# Patient Record
Sex: Male | Born: 1960 | Race: White | Hispanic: No | Marital: Single | Smoking: Former smoker
Health system: Southern US, Community
[De-identification: ages and names within clinical notes are randomized; demographics above are authoritative.]

## PROBLEM LIST (undated history)

## (undated) DIAGNOSIS — E119 Type 2 diabetes mellitus without complications: Secondary | ICD-10-CM

## (undated) DIAGNOSIS — M549 Dorsalgia, unspecified: Secondary | ICD-10-CM

## (undated) DIAGNOSIS — K219 Gastro-esophageal reflux disease without esophagitis: Secondary | ICD-10-CM

## (undated) DIAGNOSIS — K5732 Diverticulitis of large intestine without perforation or abscess without bleeding: Secondary | ICD-10-CM

## (undated) DIAGNOSIS — Z87442 Personal history of urinary calculi: Secondary | ICD-10-CM

## (undated) DIAGNOSIS — L0390 Cellulitis, unspecified: Secondary | ICD-10-CM

## (undated) DIAGNOSIS — G8929 Other chronic pain: Secondary | ICD-10-CM

## (undated) DIAGNOSIS — R911 Solitary pulmonary nodule: Secondary | ICD-10-CM

## (undated) DIAGNOSIS — F419 Anxiety disorder, unspecified: Secondary | ICD-10-CM

## (undated) DIAGNOSIS — J449 Chronic obstructive pulmonary disease, unspecified: Secondary | ICD-10-CM

## (undated) DIAGNOSIS — I10 Essential (primary) hypertension: Secondary | ICD-10-CM

## (undated) DIAGNOSIS — F40240 Claustrophobia: Secondary | ICD-10-CM

## (undated) DIAGNOSIS — I219 Acute myocardial infarction, unspecified: Secondary | ICD-10-CM

## (undated) DIAGNOSIS — M1990 Unspecified osteoarthritis, unspecified site: Secondary | ICD-10-CM

## (undated) DIAGNOSIS — E669 Obesity, unspecified: Secondary | ICD-10-CM

## (undated) DIAGNOSIS — E785 Hyperlipidemia, unspecified: Secondary | ICD-10-CM

## (undated) HISTORY — DX: Acute myocardial infarction, unspecified: I21.9

## (undated) HISTORY — DX: Gastro-esophageal reflux disease without esophagitis: K21.9

## (undated) HISTORY — DX: Anxiety disorder, unspecified: F41.9

## (undated) HISTORY — DX: Type 2 diabetes mellitus without complications: E11.9

## (undated) HISTORY — DX: Cellulitis, unspecified: L03.90

## (undated) HISTORY — DX: Dorsalgia, unspecified: M54.9

## (undated) HISTORY — DX: Claustrophobia: F40.240

## (undated) HISTORY — DX: Hyperlipidemia, unspecified: E78.5

## (undated) HISTORY — DX: Essential (primary) hypertension: I10

## (undated) HISTORY — DX: Other chronic pain: G89.29

## (undated) HISTORY — DX: Solitary pulmonary nodule: R91.1

## (undated) HISTORY — DX: Diverticulitis of large intestine without perforation or abscess without bleeding: K57.32

## (undated) HISTORY — PX: TONSILLECTOMY: SUR1361

## (undated) HISTORY — DX: Obesity, unspecified: E66.9

## (undated) HISTORY — DX: Chronic obstructive pulmonary disease, unspecified: J44.9

---

## 2003-08-03 ENCOUNTER — Ambulatory Visit (HOSPITAL_COMMUNITY): Admission: RE | Admit: 2003-08-03 | Discharge: 2003-08-03 | Payer: Self-pay | Admitting: Family Medicine

## 2003-08-22 ENCOUNTER — Ambulatory Visit (HOSPITAL_COMMUNITY): Admission: RE | Admit: 2003-08-22 | Discharge: 2003-08-22 | Payer: Self-pay | Admitting: Family Medicine

## 2004-03-03 ENCOUNTER — Ambulatory Visit: Payer: Self-pay | Admitting: Family Medicine

## 2004-04-09 ENCOUNTER — Ambulatory Visit: Payer: Self-pay | Admitting: Family Medicine

## 2004-05-08 ENCOUNTER — Ambulatory Visit: Payer: Self-pay | Admitting: Family Medicine

## 2004-07-07 ENCOUNTER — Ambulatory Visit: Payer: Self-pay | Admitting: Family Medicine

## 2004-08-11 ENCOUNTER — Ambulatory Visit: Payer: Self-pay | Admitting: Family Medicine

## 2004-08-18 ENCOUNTER — Ambulatory Visit (HOSPITAL_COMMUNITY): Admission: RE | Admit: 2004-08-18 | Discharge: 2004-08-18 | Payer: Self-pay | Admitting: Family Medicine

## 2004-09-09 ENCOUNTER — Ambulatory Visit: Payer: Self-pay | Admitting: Family Medicine

## 2004-10-13 ENCOUNTER — Ambulatory Visit: Payer: Self-pay | Admitting: Family Medicine

## 2004-11-25 ENCOUNTER — Ambulatory Visit: Payer: Self-pay | Admitting: Family Medicine

## 2005-01-12 HISTORY — PX: APPENDECTOMY: SHX54

## 2005-02-25 ENCOUNTER — Ambulatory Visit: Payer: Self-pay | Admitting: Family Medicine

## 2005-04-02 ENCOUNTER — Ambulatory Visit: Payer: Self-pay | Admitting: Family Medicine

## 2005-08-03 ENCOUNTER — Ambulatory Visit: Payer: Self-pay | Admitting: Family Medicine

## 2006-01-12 DIAGNOSIS — K5732 Diverticulitis of large intestine without perforation or abscess without bleeding: Secondary | ICD-10-CM

## 2006-01-12 HISTORY — DX: Diverticulitis of large intestine without perforation or abscess without bleeding: K57.32

## 2006-01-20 ENCOUNTER — Ambulatory Visit: Payer: Self-pay | Admitting: Family Medicine

## 2006-02-02 ENCOUNTER — Encounter: Payer: Self-pay | Admitting: Family Medicine

## 2006-02-02 ENCOUNTER — Encounter (INDEPENDENT_AMBULATORY_CARE_PROVIDER_SITE_OTHER): Payer: Self-pay | Admitting: *Deleted

## 2006-02-02 LAB — CONVERTED CEMR LAB
ALT: 23 units/L (ref 0–53)
Albumin: 3.9 g/dL (ref 3.5–5.2)
BUN: 10 mg/dL (ref 6–23)
Basophils Absolute: 0.1 10*3/uL (ref 0.0–0.1)
Bilirubin, Direct: <0.1 mg/dL (ref 0.0–0.3)
CO2: 27 meq/L (ref 19–32)
Calcium: 9 mg/dL (ref 8.4–10.5)
Cholesterol: 185 mg/dL (ref 0–200)
Creatinine, Ser: 0.89 mg/dL (ref 0.40–1.50)
Eosinophils Relative: 3 % (ref 0–5)
HCT: 46.9 % (ref 39.0–52.0)
Hgb A1c MFr Bld: 6.6 % — ABNORMAL HIGH (ref 4.6–6.1)
LDL Cholesterol: 80 mg/dL (ref 0–99)
Lymphocytes Relative: 28 % (ref 12–46)
MCV: 93.6 fL (ref 78.0–100.0)
Monocytes Absolute: 0.9 10*3/uL — ABNORMAL HIGH (ref 0.2–0.7)
Neutro Abs: 6.1 10*3/uL (ref 1.7–7.7)
Neutrophils Relative %: 59 % (ref 43–77)
PSA: 0.41 ng/mL
Platelets: 282 10*3/uL (ref 150–400)
RDW: 13.9 % (ref 11.5–14.0)
TSH: 4.573 microintl units/mL (ref 0.350–5.50)
Total Bilirubin: 0.2 mg/dL — ABNORMAL LOW (ref 0.3–1.2)
Triglycerides: 361 mg/dL — ABNORMAL HIGH (ref <150)
WBC: 10.3 10*3/uL (ref 4.0–10.5)

## 2006-05-26 ENCOUNTER — Emergency Department (HOSPITAL_COMMUNITY): Admission: EM | Admit: 2006-05-26 | Discharge: 2006-05-26 | Payer: Self-pay | Admitting: *Deleted

## 2006-05-26 ENCOUNTER — Ambulatory Visit: Payer: Self-pay | Admitting: Family Medicine

## 2006-06-04 ENCOUNTER — Ambulatory Visit: Payer: Self-pay | Admitting: Family Medicine

## 2006-07-01 ENCOUNTER — Emergency Department (HOSPITAL_COMMUNITY): Admission: EM | Admit: 2006-07-01 | Discharge: 2006-07-01 | Payer: Self-pay | Admitting: Emergency Medicine

## 2006-07-19 ENCOUNTER — Ambulatory Visit: Payer: Self-pay | Admitting: Family Medicine

## 2006-08-02 ENCOUNTER — Ambulatory Visit: Payer: Self-pay | Admitting: Gastroenterology

## 2006-08-05 ENCOUNTER — Ambulatory Visit (HOSPITAL_COMMUNITY): Admission: RE | Admit: 2006-08-05 | Discharge: 2006-08-05 | Payer: Self-pay | Admitting: Gastroenterology

## 2006-08-19 ENCOUNTER — Emergency Department (HOSPITAL_COMMUNITY): Admission: EM | Admit: 2006-08-19 | Discharge: 2006-08-19 | Payer: Self-pay | Admitting: Emergency Medicine

## 2006-08-30 ENCOUNTER — Ambulatory Visit: Payer: Self-pay | Admitting: Family Medicine

## 2006-11-23 ENCOUNTER — Emergency Department (HOSPITAL_COMMUNITY): Admission: EM | Admit: 2006-11-23 | Discharge: 2006-11-23 | Payer: Self-pay | Admitting: Emergency Medicine

## 2006-11-24 ENCOUNTER — Ambulatory Visit: Payer: Self-pay | Admitting: Urgent Care

## 2006-11-25 ENCOUNTER — Ambulatory Visit: Payer: Self-pay | Admitting: Family Medicine

## 2006-12-02 ENCOUNTER — Ambulatory Visit (HOSPITAL_COMMUNITY): Admission: RE | Admit: 2006-12-02 | Discharge: 2006-12-02 | Payer: Self-pay | Admitting: Gastroenterology

## 2006-12-20 ENCOUNTER — Ambulatory Visit: Payer: Self-pay | Admitting: Family Medicine

## 2007-01-13 ENCOUNTER — Encounter: Payer: Self-pay | Admitting: Family Medicine

## 2007-01-17 ENCOUNTER — Ambulatory Visit: Payer: Self-pay | Admitting: Family Medicine

## 2007-02-14 ENCOUNTER — Ambulatory Visit: Payer: Self-pay | Admitting: Family Medicine

## 2007-05-13 ENCOUNTER — Encounter (INDEPENDENT_AMBULATORY_CARE_PROVIDER_SITE_OTHER): Payer: Self-pay | Admitting: *Deleted

## 2007-05-13 DIAGNOSIS — E1165 Type 2 diabetes mellitus with hyperglycemia: Secondary | ICD-10-CM | POA: Insufficient documentation

## 2007-05-13 DIAGNOSIS — J984 Other disorders of lung: Secondary | ICD-10-CM | POA: Insufficient documentation

## 2007-05-13 DIAGNOSIS — E785 Hyperlipidemia, unspecified: Secondary | ICD-10-CM | POA: Insufficient documentation

## 2007-05-13 DIAGNOSIS — Z794 Long term (current) use of insulin: Secondary | ICD-10-CM

## 2007-05-13 DIAGNOSIS — F411 Generalized anxiety disorder: Secondary | ICD-10-CM | POA: Insufficient documentation

## 2007-05-13 DIAGNOSIS — E669 Obesity, unspecified: Secondary | ICD-10-CM | POA: Insufficient documentation

## 2007-05-13 DIAGNOSIS — I10 Essential (primary) hypertension: Secondary | ICD-10-CM | POA: Insufficient documentation

## 2007-05-13 DIAGNOSIS — J439 Emphysema, unspecified: Secondary | ICD-10-CM | POA: Insufficient documentation

## 2007-05-17 ENCOUNTER — Encounter: Payer: Self-pay | Admitting: Family Medicine

## 2007-05-17 ENCOUNTER — Ambulatory Visit: Payer: Self-pay | Admitting: Family Medicine

## 2007-05-17 LAB — CONVERTED CEMR LAB
Albumin: 4.4 g/dL (ref 3.5–5.2)
BUN: 16 mg/dL (ref 6–23)
CO2: 22 meq/L (ref 19–32)
Chloride: 105 meq/L (ref 96–112)
HDL: 33 mg/dL — ABNORMAL LOW (ref >39)
Hgb A1c MFr Bld: 6.4 %
LDL Cholesterol: 162 mg/dL — ABNORMAL HIGH (ref 0–99)
Potassium: 4.6 meq/L (ref 3.5–5.3)
Sodium: 142 meq/L (ref 135–145)
Total Bilirubin: 0.4 mg/dL (ref 0.3–1.2)
Total Protein: 7.2 g/dL (ref 6.0–8.3)
VLDL: 51 mg/dL — ABNORMAL HIGH (ref 0–40)

## 2007-06-16 ENCOUNTER — Encounter: Payer: Self-pay | Admitting: Family Medicine

## 2007-06-16 ENCOUNTER — Ambulatory Visit: Payer: Self-pay | Admitting: Family Medicine

## 2007-06-16 LAB — CONVERTED CEMR LAB: Glucose, Bld: 165 mg/dL

## 2007-08-22 ENCOUNTER — Encounter: Payer: Self-pay | Admitting: Family Medicine

## 2007-08-23 ENCOUNTER — Ambulatory Visit: Payer: Self-pay | Admitting: Family Medicine

## 2007-08-23 LAB — CONVERTED CEMR LAB: Glucose, Bld: 139 mg/dL

## 2007-09-02 ENCOUNTER — Encounter: Payer: Self-pay | Admitting: Family Medicine

## 2007-09-05 ENCOUNTER — Encounter: Payer: Self-pay | Admitting: Family Medicine

## 2007-09-12 ENCOUNTER — Encounter: Payer: Self-pay | Admitting: Family Medicine

## 2007-09-13 ENCOUNTER — Encounter: Payer: Self-pay | Admitting: Family Medicine

## 2007-10-06 ENCOUNTER — Encounter (INDEPENDENT_AMBULATORY_CARE_PROVIDER_SITE_OTHER): Payer: Self-pay | Admitting: Family Medicine

## 2007-10-06 ENCOUNTER — Encounter: Payer: Self-pay | Admitting: Family Medicine

## 2007-10-10 ENCOUNTER — Encounter: Payer: Self-pay | Admitting: Family Medicine

## 2007-10-10 ENCOUNTER — Telehealth: Payer: Self-pay | Admitting: Family Medicine

## 2007-10-25 ENCOUNTER — Encounter: Payer: Self-pay | Admitting: Family Medicine

## 2007-11-11 ENCOUNTER — Encounter: Payer: Self-pay | Admitting: Family Medicine

## 2007-11-15 ENCOUNTER — Encounter: Payer: Self-pay | Admitting: Family Medicine

## 2007-11-23 ENCOUNTER — Ambulatory Visit: Payer: Self-pay | Admitting: Family Medicine

## 2007-11-23 DIAGNOSIS — L049 Acute lymphadenitis, unspecified: Secondary | ICD-10-CM | POA: Insufficient documentation

## 2007-11-23 LAB — CONVERTED CEMR LAB: Glucose, Bld: 193 mg/dL

## 2007-11-25 ENCOUNTER — Encounter: Payer: Self-pay | Admitting: Family Medicine

## 2007-11-30 ENCOUNTER — Encounter: Payer: Self-pay | Admitting: Family Medicine

## 2007-12-07 ENCOUNTER — Encounter: Payer: Self-pay | Admitting: Family Medicine

## 2007-12-19 ENCOUNTER — Encounter: Payer: Self-pay | Admitting: Family Medicine

## 2007-12-19 ENCOUNTER — Telehealth: Payer: Self-pay | Admitting: Family Medicine

## 2007-12-20 ENCOUNTER — Encounter: Payer: Self-pay | Admitting: Family Medicine

## 2007-12-20 ENCOUNTER — Telehealth: Payer: Self-pay | Admitting: Family Medicine

## 2007-12-22 ENCOUNTER — Encounter: Payer: Self-pay | Admitting: Family Medicine

## 2007-12-28 ENCOUNTER — Telehealth: Payer: Self-pay | Admitting: Family Medicine

## 2007-12-28 ENCOUNTER — Encounter: Payer: Self-pay | Admitting: Family Medicine

## 2008-01-02 ENCOUNTER — Encounter: Payer: Self-pay | Admitting: Family Medicine

## 2008-02-08 ENCOUNTER — Telehealth: Payer: Self-pay | Admitting: Family Medicine

## 2008-02-08 ENCOUNTER — Encounter: Payer: Self-pay | Admitting: Family Medicine

## 2008-02-28 ENCOUNTER — Ambulatory Visit: Payer: Self-pay | Admitting: Family Medicine

## 2008-03-07 ENCOUNTER — Encounter: Payer: Self-pay | Admitting: Family Medicine

## 2008-03-08 ENCOUNTER — Telehealth: Payer: Self-pay | Admitting: Family Medicine

## 2008-03-09 ENCOUNTER — Telehealth: Payer: Self-pay | Admitting: Family Medicine

## 2008-03-09 ENCOUNTER — Encounter: Payer: Self-pay | Admitting: Family Medicine

## 2008-03-23 ENCOUNTER — Encounter: Payer: Self-pay | Admitting: Family Medicine

## 2008-03-27 ENCOUNTER — Telehealth: Payer: Self-pay | Admitting: Family Medicine

## 2008-03-29 ENCOUNTER — Ambulatory Visit: Payer: Self-pay | Admitting: Family Medicine

## 2008-03-29 DIAGNOSIS — N498 Inflammatory disorders of other specified male genital organs: Secondary | ICD-10-CM | POA: Insufficient documentation

## 2008-04-06 ENCOUNTER — Encounter: Payer: Self-pay | Admitting: Family Medicine

## 2008-04-24 ENCOUNTER — Encounter: Payer: Self-pay | Admitting: Family Medicine

## 2008-05-09 ENCOUNTER — Encounter: Payer: Self-pay | Admitting: Family Medicine

## 2008-05-09 ENCOUNTER — Telehealth: Payer: Self-pay | Admitting: Family Medicine

## 2008-05-30 ENCOUNTER — Ambulatory Visit: Payer: Self-pay | Admitting: Family Medicine

## 2008-05-30 LAB — CONVERTED CEMR LAB
Glucose, Bld: 150 mg/dL
Hgb A1c MFr Bld: 7.5 %

## 2008-06-06 ENCOUNTER — Encounter: Payer: Self-pay | Admitting: Family Medicine

## 2008-06-06 ENCOUNTER — Telehealth: Payer: Self-pay | Admitting: Family Medicine

## 2008-07-06 ENCOUNTER — Encounter: Payer: Self-pay | Admitting: Family Medicine

## 2008-07-09 ENCOUNTER — Encounter: Payer: Self-pay | Admitting: Family Medicine

## 2008-08-03 ENCOUNTER — Encounter: Payer: Self-pay | Admitting: Family Medicine

## 2008-08-24 ENCOUNTER — Encounter: Payer: Self-pay | Admitting: Family Medicine

## 2008-08-30 ENCOUNTER — Encounter: Payer: Self-pay | Admitting: Family Medicine

## 2008-08-31 ENCOUNTER — Encounter: Payer: Self-pay | Admitting: Family Medicine

## 2008-09-25 ENCOUNTER — Encounter: Payer: Self-pay | Admitting: Family Medicine

## 2008-09-28 ENCOUNTER — Encounter: Payer: Self-pay | Admitting: Family Medicine

## 2008-10-24 ENCOUNTER — Encounter: Payer: Self-pay | Admitting: Family Medicine

## 2008-10-26 ENCOUNTER — Telehealth: Payer: Self-pay | Admitting: Family Medicine

## 2008-10-26 ENCOUNTER — Encounter: Payer: Self-pay | Admitting: Family Medicine

## 2008-11-19 ENCOUNTER — Ambulatory Visit: Payer: Self-pay | Admitting: Family Medicine

## 2008-11-19 LAB — CONVERTED CEMR LAB: Glucose, Bld: 322 mg/dL

## 2008-11-21 ENCOUNTER — Telehealth: Payer: Self-pay | Admitting: Family Medicine

## 2008-11-22 ENCOUNTER — Inpatient Hospital Stay (HOSPITAL_COMMUNITY): Admission: EM | Admit: 2008-11-22 | Discharge: 2008-11-22 | Payer: Self-pay | Admitting: Emergency Medicine

## 2008-11-26 ENCOUNTER — Encounter: Payer: Self-pay | Admitting: Family Medicine

## 2008-11-27 ENCOUNTER — Telehealth: Payer: Self-pay | Admitting: Family Medicine

## 2008-11-27 ENCOUNTER — Ambulatory Visit: Payer: Self-pay | Admitting: Family Medicine

## 2008-11-27 LAB — CONVERTED CEMR LAB: Glucose, Bld: 284 mg/dL

## 2008-11-28 ENCOUNTER — Telehealth: Payer: Self-pay | Admitting: Family Medicine

## 2008-12-04 ENCOUNTER — Ambulatory Visit: Payer: Self-pay | Admitting: Family Medicine

## 2008-12-05 ENCOUNTER — Telehealth: Payer: Self-pay | Admitting: Family Medicine

## 2008-12-13 ENCOUNTER — Encounter: Payer: Self-pay | Admitting: Family Medicine

## 2008-12-19 ENCOUNTER — Ambulatory Visit: Payer: Self-pay | Admitting: Family Medicine

## 2009-01-12 DIAGNOSIS — L0390 Cellulitis, unspecified: Secondary | ICD-10-CM

## 2009-01-12 HISTORY — DX: Cellulitis, unspecified: L03.90

## 2009-01-18 ENCOUNTER — Encounter: Payer: Self-pay | Admitting: Family Medicine

## 2009-01-18 ENCOUNTER — Telehealth: Payer: Self-pay | Admitting: Family Medicine

## 2009-01-25 ENCOUNTER — Ambulatory Visit: Payer: Self-pay | Admitting: Family Medicine

## 2009-01-25 ENCOUNTER — Telehealth: Payer: Self-pay | Admitting: Family Medicine

## 2009-01-25 DIAGNOSIS — R5381 Other malaise: Secondary | ICD-10-CM | POA: Insufficient documentation

## 2009-01-25 DIAGNOSIS — R5383 Other fatigue: Secondary | ICD-10-CM

## 2009-01-25 LAB — CONVERTED CEMR LAB
BUN: 21 mg/dL (ref 6–23)
Bilirubin, Direct: <0.1 mg/dL (ref 0.0–0.3)
Chloride: 103 meq/L (ref 96–112)
Glucose, Bld: 114 mg/dL — ABNORMAL HIGH (ref 70–99)
LDL Cholesterol: 106 mg/dL — ABNORMAL HIGH (ref 0–99)
PSA: 0.38 ng/mL (ref 0.10–4.00)
Potassium: 4.4 meq/L (ref 3.5–5.3)
TSH: 2.388 microintl units/mL (ref 0.350–4.500)
Total Protein: 6.6 g/dL (ref 6.0–8.3)
Triglycerides: 232 mg/dL — ABNORMAL HIGH (ref <150)
VLDL: 46 mg/dL — ABNORMAL HIGH (ref 0–40)

## 2009-02-18 ENCOUNTER — Encounter: Payer: Self-pay | Admitting: Family Medicine

## 2009-02-26 ENCOUNTER — Encounter: Payer: Self-pay | Admitting: Family Medicine

## 2009-03-05 ENCOUNTER — Telehealth: Payer: Self-pay | Admitting: Family Medicine

## 2009-03-19 ENCOUNTER — Encounter: Payer: Self-pay | Admitting: Family Medicine

## 2009-03-25 ENCOUNTER — Ambulatory Visit: Payer: Self-pay | Admitting: Family Medicine

## 2009-03-25 LAB — CONVERTED CEMR LAB: Blood Glucose, Fasting: 134 mg/dL

## 2009-04-18 ENCOUNTER — Encounter: Payer: Self-pay | Admitting: Family Medicine

## 2009-04-18 ENCOUNTER — Telehealth: Payer: Self-pay | Admitting: Family Medicine

## 2009-05-15 ENCOUNTER — Encounter: Payer: Self-pay | Admitting: Family Medicine

## 2009-06-13 ENCOUNTER — Encounter: Payer: Self-pay | Admitting: Family Medicine

## 2009-06-24 ENCOUNTER — Ambulatory Visit: Payer: Self-pay | Admitting: Family Medicine

## 2009-06-24 ENCOUNTER — Telehealth: Payer: Self-pay | Admitting: Family Medicine

## 2009-06-24 DIAGNOSIS — R109 Unspecified abdominal pain: Secondary | ICD-10-CM | POA: Insufficient documentation

## 2009-06-24 DIAGNOSIS — K469 Unspecified abdominal hernia without obstruction or gangrene: Secondary | ICD-10-CM | POA: Insufficient documentation

## 2009-07-02 ENCOUNTER — Encounter: Payer: Self-pay | Admitting: Family Medicine

## 2009-07-03 ENCOUNTER — Encounter: Payer: Self-pay | Admitting: Family Medicine

## 2009-07-03 LAB — CONVERTED CEMR LAB
ALT: 27 units/L (ref 0–53)
AST: 20 units/L (ref 0–37)
Albumin: 4 g/dL (ref 3.5–5.2)
Basophils Absolute: 0.1 10*3/uL (ref 0.0–0.1)
Calcium: 9.6 mg/dL (ref 8.4–10.5)
Cholesterol: 240 mg/dL — ABNORMAL HIGH (ref 0–200)
Glucose, Bld: 170 mg/dL — ABNORMAL HIGH (ref 70–99)
HDL: 33 mg/dL — ABNORMAL LOW (ref >39)
Hemoglobin: 14.9 g/dL (ref 13.0–17.0)
Hgb A1c MFr Bld: 7.3 % — ABNORMAL HIGH (ref <5.7)
Lymphocytes Relative: 26 % (ref 12–46)
Monocytes Absolute: 1.2 10*3/uL — ABNORMAL HIGH (ref 0.1–1.0)
Neutro Abs: 7.1 10*3/uL (ref 1.7–7.7)
Neutrophils Relative %: 62 % (ref 43–77)
Potassium: 4.8 meq/L (ref 3.5–5.3)
RDW: 14.6 % (ref 11.5–15.5)
Sodium: 137 meq/L (ref 135–145)
TSH: 2.55 microintl units/mL (ref 0.350–4.500)
Total CHOL/HDL Ratio: 7.3
Total Protein: 7 g/dL (ref 6.0–8.3)
Triglycerides: 599 mg/dL — ABNORMAL HIGH (ref <150)

## 2009-07-12 ENCOUNTER — Encounter: Payer: Self-pay | Admitting: Family Medicine

## 2009-08-14 ENCOUNTER — Telehealth: Payer: Self-pay | Admitting: Family Medicine

## 2009-08-14 ENCOUNTER — Encounter: Payer: Self-pay | Admitting: Family Medicine

## 2009-08-27 ENCOUNTER — Ambulatory Visit: Payer: Self-pay | Admitting: Family Medicine

## 2009-08-30 ENCOUNTER — Telehealth: Payer: Self-pay | Admitting: Family Medicine

## 2009-09-13 ENCOUNTER — Encounter: Payer: Self-pay | Admitting: Family Medicine

## 2009-09-30 ENCOUNTER — Telehealth: Payer: Self-pay | Admitting: Family Medicine

## 2009-10-14 ENCOUNTER — Encounter: Payer: Self-pay | Admitting: Family Medicine

## 2009-10-17 ENCOUNTER — Ambulatory Visit: Payer: Self-pay | Admitting: Family Medicine

## 2009-11-14 ENCOUNTER — Telehealth: Payer: Self-pay | Admitting: Family Medicine

## 2009-11-15 ENCOUNTER — Encounter: Payer: Self-pay | Admitting: Family Medicine

## 2009-11-18 ENCOUNTER — Telehealth: Payer: Self-pay | Admitting: Family Medicine

## 2009-11-28 ENCOUNTER — Ambulatory Visit: Payer: Self-pay | Admitting: Family Medicine

## 2009-11-29 ENCOUNTER — Encounter: Payer: Self-pay | Admitting: Family Medicine

## 2009-11-29 ENCOUNTER — Telehealth: Payer: Self-pay | Admitting: Family Medicine

## 2009-12-07 DIAGNOSIS — L0211 Cutaneous abscess of neck: Secondary | ICD-10-CM | POA: Insufficient documentation

## 2009-12-07 DIAGNOSIS — L03221 Cellulitis of neck: Secondary | ICD-10-CM

## 2009-12-11 ENCOUNTER — Ambulatory Visit: Payer: Self-pay | Admitting: Family Medicine

## 2009-12-12 ENCOUNTER — Telehealth: Payer: Self-pay | Admitting: Family Medicine

## 2009-12-25 ENCOUNTER — Telehealth: Payer: Self-pay | Admitting: Family Medicine

## 2009-12-30 ENCOUNTER — Encounter: Payer: Self-pay | Admitting: Family Medicine

## 2010-01-14 ENCOUNTER — Telehealth: Payer: Self-pay | Admitting: Family Medicine

## 2010-01-21 ENCOUNTER — Ambulatory Visit
Admission: RE | Admit: 2010-01-21 | Discharge: 2010-01-21 | Payer: Self-pay | Source: Home / Self Care | Attending: Family Medicine | Admitting: Family Medicine

## 2010-01-22 ENCOUNTER — Telehealth: Payer: Self-pay | Admitting: Family Medicine

## 2010-01-23 ENCOUNTER — Encounter: Payer: Self-pay | Admitting: Family Medicine

## 2010-01-27 ENCOUNTER — Telehealth: Payer: Self-pay | Admitting: Family Medicine

## 2010-01-28 ENCOUNTER — Telehealth: Payer: Self-pay | Admitting: Family Medicine

## 2010-01-28 ENCOUNTER — Encounter: Payer: Self-pay | Admitting: Family Medicine

## 2010-02-02 ENCOUNTER — Encounter: Payer: Self-pay | Admitting: Internal Medicine

## 2010-02-02 ENCOUNTER — Encounter: Payer: Self-pay | Admitting: Family Medicine

## 2010-02-06 LAB — CONVERTED CEMR LAB
ALT: 38 units/L (ref 0–53)
Alkaline Phosphatase: 57 units/L (ref 39–117)
BUN: 23 mg/dL (ref 6–23)
Bilirubin, Direct: 0.1 mg/dL (ref 0.0–0.3)
Cholesterol: 270 mg/dL — ABNORMAL HIGH (ref 0–200)
Creatinine, Ser: 1.04 mg/dL (ref 0.40–1.50)
Glucose, Bld: 156 mg/dL — ABNORMAL HIGH (ref 70–99)
Indirect Bilirubin: 0.3 mg/dL (ref 0.0–0.9)
LDL Cholesterol: 176 mg/dL — ABNORMAL HIGH (ref 0–99)
Total Protein: 7.3 g/dL (ref 6.0–8.3)
Triglycerides: 304 mg/dL — ABNORMAL HIGH (ref <150)
VLDL: 61 mg/dL — ABNORMAL HIGH (ref 0–40)
Vit D, 25-Hydroxy: 31 ng/mL (ref 30–89)

## 2010-02-07 ENCOUNTER — Telehealth: Payer: Self-pay | Admitting: Family Medicine

## 2010-02-10 ENCOUNTER — Encounter: Payer: Self-pay | Admitting: Family Medicine

## 2010-02-12 NOTE — Miscellaneous (Signed)
Summary: [facility] REFILL  Clinical Lists Changes  Medications: Rx of OXYCODONE HCL 15 MG TABS (OXYCODONE HCL) Take 1 tablet by mouth four times a day;  #120 x 0;  Signed;  Entered by: Everitt Amber LPN;  Authorized by: Syliva Overman MD;  Method used: Handwritten    Prescriptions: OXYCODONE HCL 15 MG TABS (OXYCODONE HCL) Take 1 tablet by mouth four times a day  #120 x 0   Entered by:   Everitt Amber LPN   Authorized by:   Syliva Overman MD   Signed by:   Everitt Amber LPN on 16/10/9602   Method used:   Handwritten   RxID:   [id number]

## 2010-02-12 NOTE — Miscellaneous (Signed)
Summary: [facility]  Clinical Lists Changes  Medications: Rx of OXYCODONE HCL 15 MG TABS (OXYCODONE HCL) Take 1 tablet by mouth four times a day;  #120 x 0;  Signed;  Entered by: Everitt Amber LPN;  Authorized by: Syliva Overman MD;  Method used: Handwritten    Prescriptions: OXYCODONE HCL 15 MG TABS (OXYCODONE HCL) Take 1 tablet by mouth four times a day  #120 x 0   Entered by:   Everitt Amber LPN   Authorized by:   Syliva Overman MD   Signed by:   Everitt Amber LPN on 33/29/5188   Method used:   Handwritten   RxID:   [id number]

## 2010-02-12 NOTE — Assessment & Plan Note (Signed)
Summary: F UP   Vital Signs:  Patient profile:   50 year old male Height:      69 inches Weight:      273.25 pounds BMI:     40.50 O2 Sat:      99 % Pulse rate:   88 / minute Pulse rhythm:   regular Resp:     16 per minute BP sitting:   130 / 80 Cuff size:   large  Vitals Entered By: Everitt Amber (January 25, 2009 10:49 AM)  Nutrition Counseling: Patient's BMI is greater than 25 and therefore counseled on weight management options. CC: Follow up chronic problems   Primary Care Provider:  Syliva Overman MD  CC:  Follow up chronic problems.  History of Present Illness: Reports  that he has been doing well. States he feels better than he has in years. He is having some fatigue because of babysitting a new grandbaby, which gives him alot of pride and joy. Denies recent fever or chills. Denies sinus pressure, nasal congestion , ear pain or sore throat. Denies chest congestion, or cough productive of sputum. Denies chest pain, palpitations, PND, orthopnea or leg swelling. Denies abdominal pain, nausea, vomitting, diarrhea or constipation. Denies change in bowel movements or bloody stool. Denies dysuria , frequency, incontinence or hesitancy. Has chronic back pain with reduced mobility. Denies headaches, vertigo, seizures. Denies depression, anxiety or insomnia. Denies  rash, lesions, or itch.     Allergies: 1)  ! Darvocet  Review of Systems      See HPI Eyes:  Denies blurring and red eye. Neuro:  Denies headaches, seizures, and sensation of room spinning. Heme:  Denies abnormal bruising and bleeding. Allergy:  Denies hives or rash.  Physical Exam  General:  Well-developed,obese,in no acute distress; alert,appropriate and cooperative throughout examination HEENT: No facial asymmetry,  EOMI, No sinus tenderness, TM's Clear, oropharynx  pink and moist.   Chest: Clear to auscultation bilaterally.  CVS: S1, S2, No murmurs, No S3.   Abd: Soft, Nontender.  MS:  decreased  ROM spine,adequate in  hips, shoulders and knees.  Ext: No edema.   CNS: CN 2-12 intact, power tone and sensation normal throughout.   Skin: Intact, no visible lesions or rashes.  Psych: Good eye contact, normal affect.  Memory intact, less anxious  appearing.    Impression & Recommendations:  Problem # 1:  ANXIETY (ICD-300.00) Assessment Improved  His updated medication list for this problem includes:    Xanax 1 Mg Tabs (Alprazolam) ..... One tab by mouth four times a day  Problem # 2:  HYPERLIPIDEMIA (ICD-272.4) Assessment: Comment Only  His updated medication list for this problem includes:    Crestor 20 Mg Tabs (Rosuvastatin calcium) ..... One tab by mouth qhs  Orders: T-Hepatic Function [phone]) T-Lipid Profile [phone])  Labs Reviewed: SGOT: 21 (05/17/2007)   SGPT: 28 (05/17/2007)   HDL:33 (05/17/2007), 33 (02/02/2006)  LDL:162 (05/17/2007), 80 (29/56/2130)  Chol:246 (05/17/2007), 185 (02/02/2006)  Trig:253 (05/17/2007), 361 (02/02/2006)  Problem # 3:  OBESITY, UNSPECIFIED (ICD-278.00) Assessment: Deteriorated  Ht: 69 (01/25/2009)   Wt: 273.25 (01/25/2009)   BMI: 40.50 (01/25/2009)  Problem # 4:  DIABETES MELLITUS, TYPE II (ICD-250.00) Assessment: Comment Only  His updated medication list for this problem includes:    Benazepril-hydrochlorothiazide 20-25 Mg Tabs (Benazepril-hydrochlorothiazide) ..... One tab by mouth once daily    Metformin Hcl 1000 Mg Tabs (Metformin hcl) ..... One tab by mouth two times a day    Glipizide 10 Mg  Tabs (Glipizide) ..... One tab by mouth bid  Orders: Glucose, (CBG) (62952) T-Urine Microalbumin w/creat. ratio [phone])  Labs Reviewed: Creat: 1.04 (05/17/2007)    Reviewed HgBA1c results: 9.6 (11/19/2008)  7.5 (05/30/2008)  Problem # 5:  HYPERTENSION (ICD-401.9) Assessment: Improved  His updated medication list for this problem includes:    Amlodipine Besylate 10 Mg Tabs (Amlodipine besylate) .....  One tab by mouth once daily    Benazepril-hydrochlorothiazide 20-25 Mg Tabs (Benazepril-hydrochlorothiazide) ..... One tab by mouth once daily    Clonidine Hcl 0.3 Mg Tabs (Clonidine hcl) ..... One tab by mouth qhs    Tekturna 300 Mg Tabs (Aliskiren fumarate) .Marland Kitchen... Take 1 tablet by mouth once a day  Orders: T-Basic Metabolic Panel [phone])  BP today: 130/80 Prior BP: 152/88 (12/19/2008)  Labs Reviewed: K+: 4.6 (05/17/2007) Creat: : 1.04 (05/17/2007)   Chol: 246 (05/17/2007)   HDL: 33 (05/17/2007)   LDL: 162 (05/17/2007)   TG: 253 (05/17/2007)  Problem # 6:  BACK PAIN, CHRONIC (ICD-724.5) Assessment: Improved  His updated medication list for this problem includes:    Oxycodone Hcl 15 Mg Tabs (Oxycodone hcl) .Marland Kitchen... Take 1 tablet by mouth four times a day  Complete Medication List: 1)  Amlodipine Besylate 10 Mg Tabs (Amlodipine besylate) .... One tab by mouth once daily 2)  Protonix 40 Mg Pack (Pantoprazole sodium) .... One tab by mouth once daily 3)  Ambien 10 Mg Tabs (Zolpidem tartrate) .... One tab by mouth at bedtime 4)  Xanax 1 Mg Tabs (Alprazolam) .... One tab by mouth four times a day 5)  Benazepril-hydrochlorothiazide 20-25 Mg Tabs (Benazepril-hydrochlorothiazide) .... One tab by mouth once daily 6)  Metformin Hcl 1000 Mg Tabs (Metformin hcl) .... One tab by mouth two times a day 7)  Crestor 20 Mg Tabs (Rosuvastatin calcium) .... One tab by mouth qhs 8)  Detrol La 4 Mg Xr24h-cap (Tolterodine tartrate) .... Take 1 tablet by mouth once a day 9)  Clonidine Hcl 0.3 Mg Tabs (Clonidine hcl) .... One tab by mouth qhs 10)  Glipizide 10 Mg Tabs (Glipizide) .... One tab by mouth bid 11)  Tekturna 300 Mg Tabs (Aliskiren fumarate) .... Take 1 tablet by mouth once a day 12)  Oxycodone Hcl 15 Mg Tabs (Oxycodone hcl) .... Take 1 tablet by mouth four times a day 13)  Tussionex Pennkinetic Er 8-10 Mg/62ml Lqcr (Chlorpheniramine-hydrocodone) .... 5cc by mouth two times a day prn  Other  Orders: T-PSA ([id number]) T-TSH ([id number])  Patient Instructions: 1)  Please schedule a follow-up appointment in 2 months. 2)  It is important that you exercise regularly at least 20 minutes 5 times a week. If you develop chest pain, have severe difficulty breathing, or feel very tired , stop exercising immediately and seek medical attention. 3)  You need to lose weight. Consider a lower calorie diet and regular exercise.  4)  BMP prior to visit, ICD-9: 5)  Hepatic Panel prior to visit, ICD-9:  fasting labs in 1 month 6)  Lipid Panel prior to visit, ICD-9: 7)  PSA prior to visit, ICD-9: 8)  TSH prior to visit, ICD-9: 9)  Microalbuminuria  today 10)  Your bP is great  Laboratory Results   Blood Tests   Date/Time Received: January 25, 2009  Date/Time Reported: January 25, 2009   Glucose (random): 167 mg/dL   (Normal Range: 16-606)

## 2010-02-12 NOTE — Assessment & Plan Note (Signed)
Summary: OV   Vital Signs:  Patient profile:   50 year old male Height:      69 inches Weight:      264.75 pounds BMI:     39.24 O2 Sat:      98 % on Room air Pulse rate:   94 / minute Pulse rhythm:   regular Resp:     16 per minute BP sitting:   124 / 80  (left arm)  Vitals Entered By: Adella Hare LPN (December 11, 2009 10:52 AM)  Nutrition Counseling: Patient's BMI is greater than 25 and therefore counseled on weight management options.  O2 Flow:  Room air CC: follow-up visit Is Patient Diabetic? Yes Did you bring your meter with you today? No Pain Assessment Patient in pain? no      Comments did not bring meds to ov, reviewed med list with patient   Primary Care Provider:  Syliva Overman MD  CC:  follow-up visit.  History of Present Illness: pt was recently hospitalised with celluluitis of th eleft neck, this is much improved. Reports  that the has been  doing well since he was discharged home. Denies recent fever or chills. Denies sinus pressure, nasal congestion , ear pain or sore throat. Denies chest congestion, or cough productive of sputum. Denies chest pain, palpitations, PND, orthopnea or leg swelling. Denies abdominal pain, nausea, vomitting, diarrhea or constipation. Denies change in bowel movements or bloody stool. Denies dysuria , frequency, incontinence or hesitancy.  Denies headaches, vertigo, seizures. Denies ucontrolled  depression, anxiety or insomnia. Denies  rash, lesions, or itch.     Allergies (verified): 1)  ! Darvocet  Past History:  Past medical, surgical, family and social histories (including risk factors) reviewed for relevance to current acute and chronic problems.  Past Medical History: Current Problems:  ANXIETY (ICD-300.00) PULMONARY NODULE (ICD-518.89) HYPERLIPIDEMIA (ICD-272.4) COPD (ICD-496) BACK PAIN, CHRONIC (ICD-724.5) OBESITY, UNSPECIFIED (ICD-278.00) DIABETES MELLITUS, TYPE II (ICD-250.00) HYPERTENSION  (ICD-401.9) hospitalised /2011 with cellulitis of eck  Past Surgical History: Reviewed history from 05/13/2007 and no changes required. Appendectomy (2007)  Family History: Reviewed history from 05/13/2007 and no changes required. Mother deceased - health status unknown Father deceased - MI 2 sisters living - DM 1 brother living - MI  Social History: Reviewed history from 05/13/2007 and no changes required. Unemployed Single Twins Former Smoker Alcohol use-no Drug use-yes (history of marijuana use)  Review of Systems      See HPI General:  Complains of fatigue. Eyes:  Denies discharge, eye pain, and red eye. MS:  Complains of low back pain and mid back pain; chronic. Psych:  Complains of anxiety, depression, and mental problems; denies sense of great danger, suicidal thoughts/plans, and thoughts of violence. Endo:  Denies cold intolerance, excessive hunger, excessive thirst, and heat intolerance. Heme:  Denies abnormal bruising and bleeding. Allergy:  Denies hives or rash and itching eyes.  Physical Exam  General:  Well-developed,obese,in no acute distress; alert,appropriate and cooperative throughout examination HEENT: No facial asymmetry,  EOMI, No sinus tenderness, TM's Clear, oropharynx  pink and moist.   Chest: Clear to auscultation bilaterally.  CVS: S1, S2, No murmurs, No S3.   Abd: Soft, tender below umbilicus with a reducible mass, bowel sounds normal.  MS: decreased  ROM spine,adequate in  hips, shoulders and knees.  Ext: No edema.   CNS: CN 2-12 intact, power tone and sensation normal throughout.   Skin: Intact, no visible lesions or rashes.  Psych: Good eye contact, normal  affect.  Memory intact, less anxious  appearing.    Impression & Recommendations:  Problem # 1:  CELLULITIS, NECK (ICD-682.1) Assessment Improved  Problem # 2:  ANXIETY (ICD-300.00) Assessment: Improved  His updated medication list for this problem includes:    Alprazolam 1 Mg  Tabs (Alprazolam) ..... One tab by mouth four times daily  Problem # 3:  BACK PAIN, CHRONIC (ICD-724.5) Assessment: Unchanged  His updated medication list for this problem includes:    Oxycodone Hcl 15 Mg Tabs (Oxycodone hcl) .Marland Kitchen... Take 1 tablet by mouth four times a day  Problem # 4:  DIABETES MELLITUS, TYPE II (ICD-250.00) Assessment: Comment Only  His updated medication list for this problem includes:    Metformin Hcl 1000 Mg Tabs (Metformin hcl) ..... One tab by mouth two times a day    Glipizide 10 Mg Tabs (Glipizide) ..... One tab by mouth bid    Januvia 100 Mg Tabs (Sitagliptin phosphate) ..... One tab by mouth once daily    Benazepril-hydrochlorothiazide 20-12.5 Mg Tabs (Benazepril-hydrochlorothiazide) .Marland Kitchen..Marland Kitchen Two tablets once daily  Labs Reviewed: Creat: 0.95 (07/02/2009)    Reviewed HgBA1c results: 7.3 (07/02/2009)  9.6 (11/19/2008)  Problem # 5:  HYPERTENSION (ICD-401.9) Assessment: Improved  His updated medication list for this problem includes:    Tekturna 300 Mg Tabs (Aliskiren fumarate) .Marland Kitchen... Take 1 tablet by mouth once a day    Benazepril-hydrochlorothiazide 20-12.5 Mg Tabs (Benazepril-hydrochlorothiazide) .Marland Kitchen..Marland Kitchen Two tablets once daily    Clonidine Hcl 0.3 Mg Tabs (Clonidine hcl) .Marland Kitchen... Take 1 tab by mouth at bedtime  BP today: 124/80 Prior BP: 160/110 (10/17/2009)  Labs Reviewed: K+: 4.8 (07/02/2009) Creat: : 0.95 (07/02/2009)   Chol: 240 (07/02/2009)   HDL: 33 (07/02/2009)   LDL: See Comment mg/dL (16/10/9602)   TG: 540 (07/02/2009)  Complete Medication List: 1)  Protonix 40 Mg Pack (Pantoprazole sodium) .... One tab by mouth once daily 2)  Metformin Hcl 1000 Mg Tabs (Metformin hcl) .... One tab by mouth two times a day 3)  Glipizide 10 Mg Tabs (Glipizide) .... One tab by mouth bid 4)  Tekturna 300 Mg Tabs (Aliskiren fumarate) .... Take 1 tablet by mouth once a day 5)  Oxycodone Hcl 15 Mg Tabs (Oxycodone hcl) .... Take 1 tablet by mouth four times a day 6)   Vesicare 10 Mg Tabs (Solifenacin succinate) .... One tab by mouth once daily  discontinue detrol 7)  Crestor 40 Mg Tabs (Rosuvastatin calcium) .... One tab by mouth at bedtime 8)  Januvia 100 Mg Tabs (Sitagliptin phosphate) .... One tab by mouth once daily 9)  Benazepril-hydrochlorothiazide 20-12.5 Mg Tabs (Benazepril-hydrochlorothiazide) .... Two tablets once daily 10)  Clonidine Hcl 0.3 Mg Tabs (Clonidine hcl) .... Take 1 tab by mouth at bedtime 11)  Alprazolam 1 Mg Tabs (Alprazolam) .... One tab by mouth four times daily 12)  Tussionex Pennkinetic Er 10-8 Mg/58ml Lqcr (Hydrocod polst-chlorphen polst) .... One and half tsp by mouth at bedtime 13)  Ambien 10 Mg Tabs (Zolpidem tartrate) .... One tab by mouth at bedtime  Patient Instructions: 1)  f/u as before. 2)  pls remember to get labs before you come. 3)    No more insulin, take the tablets for your sugar. 4)  Congrats on quitting smoking. 5)  your infection is healed Prescriptions: OXYCODONE HCL 15 MG TABS (OXYCODONE HCL) Take 1 tablet by mouth four times a day  #120 x 0   Entered by:   Adella Hare LPN   Authorized by:  Syliva Overman MD   Signed by:   Adella Hare LPN on 16/10/9602   Method used:   Handwritten   RxID:   [id number]    Orders Added: 1)  Est. Patient Level IV [21308]

## 2010-02-12 NOTE — Miscellaneous (Signed)
Summary: [facility] REFILL  Clinical Lists Changes  Medications: Rx of OXYCODONE HCL 15 MG TABS (OXYCODONE HCL) Take 1 tablet by mouth four times a day;  #120 x 0;  Signed;  Entered by: Everitt Amber LPN;  Authorized by: Syliva Overman MD;  Method used: Handwritten    Prescriptions: OXYCODONE HCL 15 MG TABS (OXYCODONE HCL) Take 1 tablet by mouth four times a day  #120 x 0   Entered by:   Everitt Amber LPN   Authorized by:   Syliva Overman MD   Signed by:   Everitt Amber LPN on 29/56/2130   Method used:   Handwritten   RxID:   [id number]

## 2010-02-12 NOTE — Progress Notes (Signed)
Summary: DIABETIC SUPPLIES  Phone Note Call from Patient   Summary of Call: TO CHECK HIS SUGAR HE NEEDS SUPPLIES FOR 3 X DAY THEY ONLY SENT IT FOR 1 X A DAY COULD YOU PLEASE SEND IT FOR 3 X A DAY [facility] Initial call taken by: Lind Guest,  December 12, 2009 10:56 AM  Follow-up for Phone Call        noted and form completed Follow-up by: Adella Hare LPN,  December 12, 2009 12:48 PM

## 2010-02-12 NOTE — Progress Notes (Signed)
Summary: Manuel Knight. [employer]  Phone Note Call from Patient   Summary of Call: ins company needs someone to call back and get an [place]. on his t[employer] call monday at [phone] Initial call taken by: Lind Guest,  January 25, 2009 2:20 PM  Follow-up for Phone Call        Called and it has been authorized for 1 year Follow-up by: Everitt Amber,  January 25, 2009 3:01 PM

## 2010-02-12 NOTE — Miscellaneous (Signed)
Summary: [facility] REFILL  Clinical Lists Changes  Medications: Rx of OXYCODONE HCL 15 MG TABS (OXYCODONE HCL) Take 1 tablet by mouth four times a day;  #120 x 0;  Signed;  Entered by: Everitt Amber LPN;  Authorized by: Syliva Overman MD;  Method used: Handwritten    Prescriptions: OXYCODONE HCL 15 MG TABS (OXYCODONE HCL) Take 1 tablet by mouth four times a day  #120 x 0   Entered by:   Everitt Amber LPN   Authorized by:   Syliva Overman MD   Signed by:   Everitt Amber LPN on 81/19/1478   Method used:   Handwritten   RxID:   [id number]

## 2010-02-12 NOTE — Miscellaneous (Signed)
Summary: Home Care Report  Home Care Report   Imported By: Lind Guest 11/29/2009 08:49:48  _____________________________________________________________________  External Attachment:    Type:   Image     Comment:   External Document

## 2010-02-12 NOTE — Letter (Signed)
Summary: phone notes  phone notes   Imported By: Curtis Sites 08/01/2009 10:51:26  _____________________________________________________________________  External Attachment:    Type:   Image     Comment:   External Document

## 2010-02-12 NOTE — Miscellaneous (Signed)
Summary: [facility] REFILL  Clinical Lists Changes  Medications: Rx of OXYCODONE HCL 15 MG TABS (OXYCODONE HCL) Take 1 tablet by mouth four times a day;  #120 x 0;  Signed;  Entered by: Everitt Amber LPN;  Authorized by: Syliva Overman MD;  Method used: Handwritten    Prescriptions: OXYCODONE HCL 15 MG TABS (OXYCODONE HCL) Take 1 tablet by mouth four times a day  #120 x 0   Entered by:   Everitt Amber LPN   Authorized by:   Syliva Overman MD   Signed by:   Everitt Amber LPN on 16/10/9602   Method used:   Handwritten   RxID:   [id number]

## 2010-02-12 NOTE — Miscellaneous (Signed)
Summary: [facility] REFILL  Clinical Lists Changes  Medications: Rx of OXYCODONE HCL 15 MG TABS (OXYCODONE HCL) Take 1 tablet by mouth four times a day;  #120 x 0;  Signed;  Entered by: Everitt Amber LPN;  Authorized by: Syliva Overman MD;  Method used: Handwritten    Prescriptions: OXYCODONE HCL 15 MG TABS (OXYCODONE HCL) Take 1 tablet by mouth four times a day  #120 x 0   Entered by:   Everitt Amber LPN   Authorized by:   Syliva Overman MD   Signed by:   Everitt Amber LPN on 16/10/9602   Method used:   Handwritten   RxID:   [id number]

## 2010-02-12 NOTE — Letter (Signed)
Summary: history and physical  history and physical   Imported By: Curtis Sites 08/01/2009 10:50:23  _____________________________________________________________________  External Attachment:    Type:   Image     Comment:   External Document

## 2010-02-12 NOTE — Progress Notes (Signed)
Summary: handicapp card  Phone Note Call from Patient   Summary of Call: needs handicapp card  call back to let him know when done Initial call taken by: Lind Guest,  January 25, 2009 2:18 PM  Follow-up for Phone Call        i will fill in what i need to pls do the rest, call and let him know when done and lv in outgoing basket pls Follow-up by: Syliva Overman MD,  January 25, 2009 3:33 PM  Additional Follow-up for Phone Call Additional follow up Details #1::        Filled out and scanned and ready to be picked up pt aware Additional Follow-up by: Everitt Amber,  January 25, 2009 4:23 PM

## 2010-02-12 NOTE — Miscellaneous (Signed)
Summary: med change  Clinical Lists Changes  Medications: Removed medication of DETROL LA 4 MG XR24H-CAP (TOLTERODINE TARTRATE) Take 1 tablet by mouth once a day Added new medication of VESICARE 10 MG TABS (SOLIFENACIN SUCCINATE) one tab by mouth once daily  discontinue detrol - Signed Rx of VESICARE 10 MG TABS (SOLIFENACIN SUCCINATE) one tab by mouth once daily  discontinue detrol;  #30 x 4;  Signed;  Entered by: Adella Hare LPN;  Authorized by: Syliva Overman MD;  Method used: Electronically to [facility] [address] [place], [place], [place]  [address], Ph: [phone] or [phone], Fax: [phone]    Prescriptions: VESICARE 10 MG TABS (SOLIFENACIN SUCCINATE) one tab by mouth once daily  discontinue detrol  #30 x 4   Entered by:   Adella Hare LPN   Authorized by:   Syliva Overman MD   Signed by:   Adella Hare LPN on 95/18/8416   Method used:   Electronically to        [facility] [address] (retail)       [address]       [place], [place]  [address]       Ph: [phone] or [phone]       Fax: [phone]   RxID:   [id number]  called patient, no answer

## 2010-02-12 NOTE — Assessment & Plan Note (Signed)
Summary: F UP   Vital Signs:  Patient profile:   50 year old male Height:      69 inches Weight:      266.50 pounds BMI:     39.50 O2 Sat:      97 % Pulse rate:   88 / minute Pulse rhythm:   regular Resp:     16 per minute BP sitting:   158 / 98  (left arm) Cuff size:   large  Vitals Entered By: Everitt Amber LPN (March 25, 2009 11:03 AM)  Nutrition Counseling: Patient's BMI is greater than 25 and therefore counseled on weight management options. CC: FOLLOW UP CHRONIC PROBLEMS Is Patient Diabetic? Yes   Primary Care Provider:  Syliva Overman MD  CC:  FOLLOW UP CHRONIC PROBLEMS.  History of Present Illness: Reports  that he has been doing fairly well. Denies recent fever or chills. Denies sinus pressure, nasal congestion , ear pain or sore throat. Denies chest congestion, or cough productive of sputum. Denies chest pain, palpitations, PND, orthopnea or leg swelling. Denies abdominal pain, nausea, vomitting, diarrhea or constipation. Denies change in bowel movements or bloody stool. Denies dysuria , frequency, incontinence or hesitancy. . Denies headaches, vertigo, seizures. Denies depression, anxiety or insomnia. Denies  rash, lesions, or itch.     Current Medications (verified): 1)  Amlodipine Besylate 10 Mg  Tabs (Amlodipine Besylate) .... One Tab By Mouth Once Daily 2)  Protonix 40 Mg  Pack (Pantoprazole Sodium) .... One Tab By Mouth Once Daily 3)  Ambien 10 Mg  Tabs (Zolpidem Tartrate) .... One Tab By Mouth At Bedtime 4)  Xanax 1 Mg  Tabs (Alprazolam) .... One Tab By Mouth Four Times A Day 5)  Benazepril-Hydrochlorothiazide 20-25 Mg  Tabs (Benazepril-Hydrochlorothiazide) .... One Tab By Mouth Once Daily 6)  Metformin Hcl 1000 Mg  Tabs (Metformin Hcl) .... One Tab By Mouth Two Times A Day 7)  Crestor 20 Mg Tabs (Rosuvastatin Calcium) .... One Tab By Mouth Qhs 8)  Clonidine Hcl 0.3 Mg Tabs (Clonidine Hcl) .... One Tab By Mouth Qhs 9)  Glipizide 10 Mg Tabs  (Glipizide) .... One Tab By Mouth Bid 10)  Tekturna 300 Mg Tabs (Aliskiren Fumarate) .... Take 1 Tablet By Mouth Once A Day 11)  Oxycodone Hcl 15 Mg Tabs (Oxycodone Hcl) .... Take 1 Tablet By Mouth Four Times A Day 12)  Tussionex Pennkinetic Er 8-10 Mg/53ml Lqcr (Chlorpheniramine-Hydrocodone) .... 5cc By Mouth Two Times A Day Prn 13)  Vesicare 10 Mg Tabs (Solifenacin Succinate) .... One Tab By Mouth Once Daily  Discontinue Detrol  Allergies (verified): 1)  ! Darvocet  Review of Systems      See HPI Eyes:  Denies blurring and discharge. MS:  Complains of low back pain, mid back pain, and stiffness; chronic. Psych:  Complains of anxiety; denies depression. Endo:  Denies cold intolerance, excessive hunger, excessive thirst, excessive urination, heat intolerance, polyuria, and weight change; tests at least once daily, sometimes has lows in to the  58, not often, fasting are generally around the 70's, bedtime  70, is not. Heme:  Denies abnormal bruising, bleeding, enlarge lymph nodes, fevers, pallor, and skin discoloration; testson avg 4 times per week, at various times , sugars seldom over 150. Allergy:  Complains of seasonal allergies.  Physical Exam  General:  Well-developed,obese,in no acute distress; alert,appropriate and cooperative throughout examination HEENT: No facial asymmetry,  EOMI, No sinus tenderness, TM's Clear, oropharynx  pink and moist.   Chest: Clear to  auscultation bilaterally.  CVS: S1, S2, No murmurs, No S3.   Abd: Soft, Nontender.  MS: decreased  ROM spine,adequate in  hips, shoulders and knees.  Ext: No edema.   CNS: CN 2-12 intact, power tone and sensation normal throughout.   Skin: Intact, no visible lesions or rashes.  Psych: Good eye contact, normal affect.  Memory intact, less anxious  appearing.    Impression & Recommendations:  Problem # 1:  ANXIETY (ICD-300.00) Assessment Improved  His updated medication list for this problem includes:    Xanax 1 Mg  Tabs (Alprazolam) ..... One tab by mouth four times a day  Problem # 2:  HYPERLIPIDEMIA (ICD-272.4) Assessment: Comment Only  His updated medication list for this problem includes:    Crestor 20 Mg Tabs (Rosuvastatin calcium) ..... One tab by mouth qhs  Orders: T-Lipid Profile [id number]) T-Hepatic Function [phone])  Labs Reviewed: SGOT: 17 (01/25/2009)   SGPT: 26 (01/25/2009)   HDL:31 (01/25/2009), 33 (05/17/2007)  LDL:106 (01/25/2009), 162 (05/17/2007)  Chol:183 (01/25/2009), 246 (05/17/2007)  Trig:232 (01/25/2009), 253 (05/17/2007)  Problem # 3:  DIABETES MELLITUS, TYPE II (ICD-250.00) Assessment: Comment Only  His updated medication list for this problem includes:    Benazepril-hydrochlorothiazide 20-25 Mg Tabs (Benazepril-hydrochlorothiazide) ..... One tab by mouth once daily    Metformin Hcl 1000 Mg Tabs (Metformin hcl) ..... One tab by mouth two times a day    Glipizide 10 Mg Tabs (Glipizide) ..... One tab by mouth bid  Orders: Glucose, (CBG) ([id number]) T- Hemoglobin A1C [phone]) T- Hemoglobin A1C ([id number])  Labs Reviewed: Creat: 0.81 (01/25/2009)    Reviewed HgBA1c results: 9.6 (11/19/2008)  7.5 (05/30/2008)  Problem # 4:  HYPERTENSION (ICD-401.9) Assessment: Deteriorated  His updated medication list for this problem includes:    Amlodipine Besylate 10 Mg Tabs (Amlodipine besylate) ..... One tab by mouth once daily    Benazepril-hydrochlorothiazide 20-25 Mg Tabs (Benazepril-hydrochlorothiazide) ..... One tab by mouth once daily    Clonidine Hcl 0.3 Mg Tabs (Clonidine hcl) ..... One tab by mouth qhs    Tekturna 300 Mg Tabs (Aliskiren fumarate) .Marland Kitchen... Take 1 tablet by mouth once a day  Orders: T-Basic Metabolic Panel [id number])  BP today: 158/98, pt has not taking med this morning Prior BP: 130/80 (01/25/2009)  Labs Reviewed: K+: 4.4 (01/25/2009) Creat: : 0.81 (01/25/2009)   Chol: 183 (01/25/2009)   HDL: 31 (01/25/2009)   LDL: 106 (01/25/2009)    TG: 232 (01/25/2009)  Complete Medication List: 1)  Amlodipine Besylate 10 Mg Tabs (Amlodipine besylate) .... One tab by mouth once daily 2)  Protonix 40 Mg Pack (Pantoprazole sodium) .... One tab by mouth once daily 3)  Ambien 10 Mg Tabs (Zolpidem tartrate) .... One tab by mouth at bedtime 4)  Xanax 1 Mg Tabs (Alprazolam) .... One tab by mouth four times a day 5)  Benazepril-hydrochlorothiazide 20-25 Mg Tabs (Benazepril-hydrochlorothiazide) .... One tab by mouth once daily 6)  Metformin Hcl 1000 Mg Tabs (Metformin hcl) .... One tab by mouth two times a day 7)  Crestor 20 Mg Tabs (Rosuvastatin calcium) .... One tab by mouth qhs 8)  Clonidine Hcl 0.3 Mg Tabs (Clonidine hcl) .... One tab by mouth qhs 9)  Glipizide 10 Mg Tabs (Glipizide) .... One tab by mouth bid 10)  Tekturna 300 Mg Tabs (Aliskiren fumarate) .... Take 1 tablet by mouth once a day 11)  Oxycodone Hcl 15 Mg Tabs (Oxycodone hcl) .... Take 1 tablet by mouth four times a day 12)  Tussionex Pennkinetic Er 8-10  Mg/73ml Lqcr (Chlorpheniramine-hydrocodone) .... 5cc by mouth two times a day prn 13)  Vesicare 10 Mg Tabs (Solifenacin succinate) .... One tab by mouth once daily  discontinue detrol  Other Orders: T-CBC w/Diff ([id number])  Patient Instructions: 1)  Please schedule a follow-up appointment in 3 months. 2)  It is important that you exercise regularly at least 20 minutes 5 times a week. If you develop chest pain, have severe difficulty breathing, or feel very tired , stop exercising immediately and seek medical attention. 3)  You need to lose weight. Consider a lower calorie diet and regular exercise.  4)  CBC w/ Diff prior to visit, ICD-9: 5)  HbgA1C prior to visit, ICD-9:   today.  Laboratory Results   Blood Tests     Glucose (fasting): 134 mg/dL   (Normal Range: 56-387)

## 2010-02-12 NOTE — Progress Notes (Signed)
Summary: MEDS  Phone Note Call from Patient   Summary of Call: NEEDS ALL HIS MEDS SENT TO [facility] Initial call taken by: Lind Guest,  November 14, 2009 4:07 PM  Follow-up for Phone Call        patient has refills on everything except xanax which i have sent in Follow-up by: Adella Hare LPN,  November 14, 2009 4:51 PM

## 2010-02-12 NOTE — Miscellaneous (Signed)
Summary: [facility] REFILL  Clinical Lists Changes  Medications: Rx of OXYCODONE HCL 15 MG TABS (OXYCODONE HCL) Take 1 tablet by mouth four times a day;  #120 x 0;  Signed;  Entered by: Everitt Amber LPN;  Authorized by: Syliva Overman MD;  Method used: Handwritten    Prescriptions: OXYCODONE HCL 15 MG TABS (OXYCODONE HCL) Take 1 tablet by mouth four times a day  #120 x 0   Entered by:   Everitt Amber LPN   Authorized by:   Syliva Overman MD   Signed by:   Everitt Amber LPN on 04/54/0981   Method used:   Handwritten   RxID:   [id number]

## 2010-02-12 NOTE — Progress Notes (Signed)
Summary: cough medicine  Phone Note Call from Patient   Summary of Call: needs his cough medicine called in at [facility]  Initial call taken by: Lind Guest,  November 29, 2009 12:55 PM  Follow-up for Phone Call        okay to fill tussinex? Follow-up by: Adella Hare LPN,  November 29, 2009 1:06 PM  Additional Follow-up for Phone Call Additional follow up Details #1::        [facility] refill[facility]3 Additional Follow-up by: Syliva Overman MD,  November 29, 2009 2:27 PM    Additional Follow-up for Phone Call Additional follow up Details #2::    rx sent Follow-up by: Adella Hare LPN,  November 29, 2009 3:06 PM  New/Updated Medications: Sandria Senter ER 10-8 MG/5ML LQCR (HYDROCOD POLST-CHLORPHEN POLST) one and half tsp by mouth at bedtime Prescriptions: TUSSIONEX PENNKINETIC ER 10-8 MG/5ML LQCR (HYDROCOD POLST-CHLORPHEN POLST) one and half tsp by mouth at bedtime  #237ml x 2   Entered by:   Adella Hare LPN   Authorized by:   Syliva Overman MD   Signed by:   Adella Hare LPN on 16/10/9602   Method used:   Printed then faxed to ...       [facility] [address] (retail)       [address]       [place], [place]  [address]       Ph: [phone] or [phone]       Fax: [phone]   RxID:   [id number]

## 2010-02-12 NOTE — Assessment & Plan Note (Signed)
Summary: office visit   Vital Signs:  Patient profile:   50 year old male Height:      69 inches Weight:      277.25 pounds BMI:     41.09 O2 Sat:      98 % Pulse rate:   99 / minute Pulse rhythm:   regular Resp:     16 per minute BP sitting:   170 / 104  (left arm) Cuff size:   large  Vitals Entered By: Everitt Amber LPN (June 24, 2009 9:08 AM)  Nutrition Counseling: Patient's BMI is greater than 25 and therefore counseled on weight management options. CC: Has a knot in his stomach that is really sore when he coughs and gets larger when she strains or picks up anything heavy   Primary Care Provider:  Syliva Overman MD  CC:  Has a knot in his stomach that is really sore when he coughs and gets larger when she strains or picks up anything heavy.  History of Present Illness: pinful right abdominasl swelling in the past 6 weeks, worse with coughing or lifting.He denies constipation or change in bowel movememnts. He denies nausea or vomitting. the pt denies any recednt fever or chills. He does have chronic fatigue. He also c/o chronic back pain.  Current Medications (verified): 1)  Amlodipine Besylate 10 Mg  Tabs (Amlodipine Besylate) .... One Tab By Mouth Once Daily 2)  Protonix 40 Mg  Pack (Pantoprazole Sodium) .... One Tab By Mouth Once Daily 3)  Ambien 10 Mg  Tabs (Zolpidem Tartrate) .... One Tab By Mouth At Bedtime 4)  Xanax 1 Mg  Tabs (Alprazolam) .... One Tab By Mouth Four Times A Day 5)  Benazepril-Hydrochlorothiazide 20-25 Mg  Tabs (Benazepril-Hydrochlorothiazide) .... One Tab By Mouth Once Daily 6)  Metformin Hcl 1000 Mg  Tabs (Metformin Hcl) .... One Tab By Mouth Two Times A Day 7)  Crestor 20 Mg Tabs (Rosuvastatin Calcium) .... One Tab By Mouth Qhs 8)  Clonidine Hcl 0.3 Mg Tabs (Clonidine Hcl) .... One Tab By Mouth Qhs 9)  Glipizide 10 Mg Tabs (Glipizide) .... One Tab By Mouth Bid 10)  Tekturna 300 Mg Tabs (Aliskiren Fumarate) .... Take 1 Tablet By Mouth Once A  Day 11)  Oxycodone Hcl 15 Mg Tabs (Oxycodone Hcl) .... Take 1 Tablet By Mouth Four Times A Day 12)  Vesicare 10 Mg Tabs (Solifenacin Succinate) .... One Tab By Mouth Once Daily  Discontinue Detrol  Allergies (verified): 1)  ! Darvocet  Review of Systems      See HPI Eyes:  Denies discharge and red eye. ENT:  Denies hoarseness, nasal congestion, sinus pressure, and sore throat. CV:  Complains of shortness of breath with exertion; denies chest pain or discomfort, palpitations, and swelling of feet. Resp:  Complains of cough and shortness of breath; denies sputum productive. GI:  Complains of abdominal pain. GU:  Denies dysuria and urinary frequency. MS:  Complains of joint pain, low back pain, mid back pain, and stiffness. Derm:  Denies itching and rash. Neuro:  Complains of headaches; denies seizures and sensation of room spinning; occasional. Psych:  Complains of anxiety; denies depression. Endo:  Denies excessive thirst and excessive urination; tests infrequently. Heme:  Denies abnormal bruising and bleeding. Allergy:  Denies hives or rash and itching eyes.  Physical Exam  General:  Well-developed,obese,in no acute distress; alert,appropriate and cooperative throughout examination HEENT: No facial asymmetry,  EOMI, No sinus tenderness, TM's Clear, oropharynx  pink and moist.  Chest: Clear to auscultation bilaterally.  CVS: S1, S2, No murmurs, No S3.   Abd: Soft, tender below umbilicus with a reducible mass, bowel sounds normal.  MS: decreased  ROM spine,adequate in  hips, shoulders and knees.  Ext: No edema.   CNS: CN 2-12 intact, power tone and sensation normal throughout.   Skin: Intact, no visible lesions or rashes.  Psych: Good eye contact, normal affect.  Memory intact, less anxious  appearing.    Impression & Recommendations:  Problem # 1:  ABDOMINAL PAIN (ICD-789.00) Assessment Deteriorated  Orders: Surgical Referral (Surgery)  Problem # 2:  HERNIA  (ICD-553.9) Assessment: Deteriorated  Orders: Radiology Referral (Radiology)  Problem # 3:  BACK PAIN, CHRONIC (ICD-724.5) Assessment: Unchanged  His updated medication list for this problem includes:    Oxycodone Hcl 15 Mg Tabs (Oxycodone hcl) .Marland Kitchen... Take 1 tablet by mouth four times a day  Problem # 4:  OBESITY, UNSPECIFIED (ICD-278.00) Assessment: Unchanged  Ht: 69 (06/24/2009)   Wt: 277.25 (06/24/2009)   BMI: 41.09 (06/24/2009)  Problem # 5:  DIABETES MELLITUS, TYPE II (ICD-250.00) Assessment: Comment Only  His updated medication list for this problem includes:    Benazepril-hydrochlorothiazide 20-25 Mg Tabs (Benazepril-hydrochlorothiazide) ..... One tab by mouth once daily    Metformin Hcl 1000 Mg Tabs (Metformin hcl) ..... One tab by mouth two times a day    Glipizide 10 Mg Tabs (Glipizide) ..... One tab by mouth bid  Orders: T- Hemoglobin A1C ([id number])  Labs Reviewed: Creat: 0.81 (01/25/2009)    Reviewed HgBA1c results: 9.6 (11/19/2008), pt to draw labs toiday, he understands  7.5 (05/30/2008)  Problem # 6:  HYPERTENSION (ICD-401.9) Assessment: Deteriorated  The following medications were removed from the medication list:    Amlodipine Besylate 10 Mg Tabs (Amlodipine besylate) ..... One tab by mouth once daily    Clonidine Hcl 0.3 Mg Tabs (Clonidine hcl) ..... One tab by mouth qhs His updated medication list for this problem includes:    Benazepril-hydrochlorothiazide 20-25 Mg Tabs (Benazepril-hydrochlorothiazide) ..... One tab by mouth once daily    Tekturna 300 Mg Tabs (Aliskiren fumarate) .Marland Kitchen... Take 1 tablet by mouth once a day    Clonidine Hcl 0.3 Mg Tabs (Clonidine hcl) ..... One tablet twice daily , at 9am and 9pm  Orders: T-Basic Metabolic Panel [id number]) T-Basic Metabolic Panel [id number])  BP today: 170/104, non compliant with med Prior BP: 158/98 (03/25/2009)  Labs Reviewed: K+: 4.4 (01/25/2009) Creat: : 0.81 (01/25/2009)   Chol: 183  (01/25/2009)   HDL: 31 (01/25/2009)   LDL: 106 (01/25/2009)   TG: 232 (01/25/2009)  Complete Medication List: 1)  Protonix 40 Mg Pack (Pantoprazole sodium) .... One tab by mouth once daily 2)  Ambien 10 Mg Tabs (Zolpidem tartrate) .... One tab by mouth at bedtime 3)  Xanax 1 Mg Tabs (Alprazolam) .... One tab by mouth four times a day 4)  Benazepril-hydrochlorothiazide 20-25 Mg Tabs (Benazepril-hydrochlorothiazide) .... One tab by mouth once daily 5)  Metformin Hcl 1000 Mg Tabs (Metformin hcl) .... One tab by mouth two times a day 6)  Crestor 20 Mg Tabs (Rosuvastatin calcium) .... One tab by mouth qhs 7)  Glipizide 10 Mg Tabs (Glipizide) .... One tab by mouth bid 8)  Tekturna 300 Mg Tabs (Aliskiren fumarate) .... Take 1 tablet by mouth once a day 9)  Oxycodone Hcl 15 Mg Tabs (Oxycodone hcl) .... Take 1 tablet by mouth four times a day 10)  Vesicare 10 Mg Tabs (Solifenacin succinate) .... One tab  by mouth once daily  discontinue detrol 11)  Clonidine Hcl 0.3 Mg Tabs (Clonidine hcl) .... One tablet twice daily , at 9am and 9pm  Other Orders: T-Hepatic Function [id number]) T-Lipid Profile [id number]) T-CBC w/Diff [id number]) T-Hepatic Function [id number]) T-Lipid Profile [id number]) T-CBC w/Diff [id number]) T-TSH ([id number])  Patient Instructions: 1)  Please schedule a follow-up appointment in 2 months. 2)  BMP prior to visit, ICD-9: 3)  Hepatic Panel prior to visit, ICD-9: 4)  Lipid Panel prior to visit, ICD-9:   fasting today. 5)  TSH prior to visit, ICD-9: 6)  CBC w/ Diff prior to visit, ICD-9: 7)  HbgA1C prior to visit, ICD-9: 8)  It is important that you exercise regularly at least 20 minutes 5 times a week. If you develop chest pain, have severe difficulty breathing, or feel very tired , stop exercising immediately and seek medical attention. 9)  You need to lose weight. Consider a lower calorie diet and regular exercise.  10)  Check your blood sugars regularly. If  your readings are usually above 250: or below 70 you should contact our office.you will be referred fro scan of your abdomen and to see a surgeon about the painful swelling in your abdomen. 11)  your bP is high, increase the clonidine to one tab twice daily. Prescriptions: CLONIDINE HCL 0.3 MG TABS (CLONIDINE HCL) one tablet twice daily , at 9am and 9pm  #60 x 3   Entered and Authorized by:   Syliva Overman MD   Signed by:   Syliva Overman MD on 06/24/2009   Method used:   Printed then faxed to ...       [facility] [address] (retail)       [address]       [place], [place]  [address]       Ph: [phone] or [phone]       Fax: [phone]   RxID:   [id number]

## 2010-02-12 NOTE — Letter (Signed)
Summary: handicapp card  handicapp card   Imported By: Lind Guest 01/25/2009 16:30:50  _____________________________________________________________________  External Attachment:    Type:   Image     Comment:   External Document

## 2010-02-12 NOTE — Letter (Signed)
Summary: progress notes  progress notes   Imported By: Curtis Sites 08/01/2009 10:51:50  _____________________________________________________________________  External Attachment:    Type:   Image     Comment:   External Document

## 2010-02-12 NOTE — Miscellaneous (Signed)
Summary: Orders Update  Clinical Lists Changes  Problems: Added new problem of ENCOUNTER FOR LONG-TERM USE OF OTHER MEDICATIONS (ICD-V58.69) Orders: Added new Test order of T- * Misc. Laboratory test [id number]) - Signed

## 2010-02-12 NOTE — Assessment & Plan Note (Signed)
Summary: certification   Allergies: 1)  ! Darvocet   Complete Medication List: 1)  Protonix 40 Mg Pack (Pantoprazole sodium) .... One tab by mouth once daily 2)  Metformin Hcl 1000 Mg Tabs (Metformin hcl) .... One tab by mouth two times a day 3)  Glipizide 10 Mg Tabs (Glipizide) .... One tab by mouth bid 4)  Tekturna 300 Mg Tabs (Aliskiren fumarate) .... Take 1 tablet by mouth once a day 5)  Oxycodone Hcl 15 Mg Tabs (Oxycodone hcl) .... Take 1 tablet by mouth four times a day 6)  Vesicare 10 Mg Tabs (Solifenacin succinate) .... One tab by mouth once daily  discontinue detrol 7)  Crestor 40 Mg Tabs (Rosuvastatin calcium) .... One tab by mouth at bedtime 8)  Januvia 100 Mg Tabs (Sitagliptin phosphate) .... One tab by mouth once daily 9)  Benazepril-hydrochlorothiazide 20-12.5 Mg Tabs (Benazepril-hydrochlorothiazide) .... Two tablets once daily 10)  Clonidine Hcl 0.3 Mg Tabs (Clonidine hcl) .... Take 1 tab by mouth at bedtime 11)  Alprazolam 1 Mg Tabs (Alprazolam) .... One tab by mouth four times daily   Orders Added: 1)  New Certification-Home Health [G0180]

## 2010-02-12 NOTE — Letter (Signed)
Summary: misc  misc   Imported By: Curtis Sites 08/01/2009 10:54:30  _____________________________________________________________________  External Attachment:    Type:   Image     Comment:   External Document

## 2010-02-12 NOTE — Miscellaneous (Signed)
Summary: [facility] REFILL  Clinical Lists Changes  Medications: Rx of OXYCODONE HCL 15 MG TABS (OXYCODONE HCL) Take 1 tablet by mouth four times a day;  #120 x 0;  Signed;  Entered by: Everitt Amber;  Authorized by: Syliva Overman MD;  Method used: Handwritten    Prescriptions: OXYCODONE HCL 15 MG TABS (OXYCODONE HCL) Take 1 tablet by mouth four times a day  #120 x 0   Entered by:   Everitt Amber   Authorized by:   Syliva Overman MD   Signed by:   Everitt Amber on 01/18/2009   Method used:   Handwritten   RxID:   [id number]

## 2010-02-12 NOTE — Letter (Signed)
Summary: xray  xray   Imported By: Curtis Sites 08/01/2009 10:52:09  _____________________________________________________________________  External Attachment:    Type:   Image     Comment:   External Document

## 2010-02-12 NOTE — Miscellaneous (Signed)
Summary: [facility]  Clinical Lists Changes  Medications: Rx of OXYCODONE HCL 15 MG TABS (OXYCODONE HCL) Take 1 tablet by mouth four times a day;  #120 x 0;  Signed;  Entered by: Everitt Amber LPN;  Authorized by: Syliva Overman MD;  Method used: Handwritten    Prescriptions: OXYCODONE HCL 15 MG TABS (OXYCODONE HCL) Take 1 tablet by mouth four times a day  #120 x 0   Entered by:   Everitt Amber LPN   Authorized by:   Syliva Overman MD   Signed by:   Everitt Amber LPN on 16/10/9602   Method used:   Handwritten   RxID:   [id number]

## 2010-02-12 NOTE — Progress Notes (Signed)
Summary: pt. refused Nurse visit today  Phone Note Other Incoming   Summary of Call: [facility] called and states Manuel Knight refused Nurse visit today stated he has an appt. tomorrow with his PCP, I informed Angie that he didn't have an appt. here tomorrow.  She states she will go out again this week. Initial call taken by: Curtis Sites,  November 18, 2009 4:17 PM  Follow-up for Phone Call        noted, pls encourage him to let the nurse do home visit per hospital staff Follow-up by: Syliva Overman MD,  November 18, 2009 7:28 PM  Additional Follow-up for Phone Call Additional follow up Details #1::        called patient, no answer Additional Follow-up by: Adella Hare LPN,  November 19, 2009 10:23 AM

## 2010-02-12 NOTE — Progress Notes (Signed)
Summary: medication  Phone Note Call from Patient   Summary of Call: patient needs prescription faxed into pharmacy for his nebulizer machine, I advised him from now on to please have pharamacy fax [place] a request.   Initial call taken by: Curtis Sites,  August 14, 2009 10:23 AM  Follow-up for Phone Call        patient states he hasnt used nebulizer in over a year, states this heat is causing him to need it but needs precription for nebulizer meds, i do not see any in record past or present Follow-up by: Adella Hare LPN,  August 14, 2009 1:49 PM  Additional Follow-up for Phone Call Additional follow up Details #1::        without office eval and lung function tests I will not prescribe a neb machine unless he currently has one, there is no record of asthma, and he is a non smoker, he needs an ov for this, pls explain Additional Follow-up by: Syliva Overman MD,  August 14, 2009 3:45 PM    Additional Follow-up for Phone Call Additional follow up Details #2::    left message to call office Follow-up by: Everitt Amber LPN,  August 16, 2009 1:50 PM  Additional Follow-up for Phone Call Additional follow up Details #3:: Details for Additional Follow-up Action Taken: cannot reach pt, will make aware if he calls back  Additional Follow-up by: Everitt Amber LPN,  August 20, 2009 7:52 AM

## 2010-02-12 NOTE — Medication Information (Signed)
Summary: Tax adviser   Imported By: Lind Guest 11/15/2009 13:25:19  _____________________________________________________________________  External Attachment:    Type:   Image     Comment:   External Document

## 2010-02-12 NOTE — Progress Notes (Signed)
  Phone Note From Pharmacy   Caller: [facility] Summary of Call: requesting refill on tussionex Initial call taken by: Adella Hare LPN,  March 05, 2009 3:23 PM  Follow-up for Phone Call        refill x 2 Follow-up by: Syliva Overman MD,  March 05, 2009 4:41 PM  Additional Follow-up for Phone Call Additional follow up Details #1::        rx sent Additional Follow-up by: Adella Hare LPN,  March 05, 2009 4:43 PM    Prescriptions: Sandria Senter ER 8-10 MG/5ML LQCR (CHLORPHENIRAMINE-HYDROCODONE) 5cc by mouth two times a day prn  #300cc x 1   Entered by:   Adella Hare LPN   Authorized by:   Syliva Overman MD   Signed by:   Adella Hare LPN on 14/78/2956   Method used:   Printed then faxed to ...       [facility] [address] (retail)       [address]       [place], [place]  [address]       Ph: [phone] or [phone]       Fax: [phone]   RxID:   [id number]

## 2010-02-12 NOTE — Letter (Signed)
Summary: consult  consult   Imported By: Curtis Sites 08/01/2009 10:50:44  _____________________________________________________________________  External Attachment:    Type:   Image     Comment:   External Document

## 2010-02-12 NOTE — Assessment & Plan Note (Signed)
Summary: office visit   Vital Signs:  Patient profile:   50 year old male Height:      69 inches Weight:      277 pounds BMI:     41.05 O2 Sat:      97 % Pulse rate:   87 / minute Pulse rhythm:   regular Resp:     16 per minute BP sitting:   140 / 84  (left arm) Cuff size:   large  Vitals Entered By: Everitt Amber LPN (August 27, 2009 9:16 AM)  Nutrition Counseling: Patient's BMI is greater than 25 and therefore counseled on weight management options. CC: Follow up chronic problems   Primary Care Provider:  Syliva Overman MD  CC:  Follow up chronic problems.  History of Present Illness: Reports  that they are doing well. Denies recent fever or chills. Denies sinus pressure, nasal congestion , ear pain or sore throat. Denies chest congestion, or cough productive of sputum. Denies chest pain, palpitations, PND, orthopnea or leg swelling. Denies abdominal pain, nausea, vomitting, diarrhea or constipation. Denies change in bowel movements or bloody stool. Denies dysuria , frequency, incontinence or hesitancy. chronic back pain unchanged, but adeqwuately taken care of by meds.. Denies headaches, vertigo, seizures. Deniesuncontrolled  depression or  anxiety increased stress due to his daughter's behavior. Denies  rash, lesions, or itch.     Allergies: 1)  ! Darvocet  Review of Systems      See HPI General:  Complains of fatigue. Eyes:  Denies blurring and discharge. MS:  Complains of low back pain and mid back pain. Psych:  Complains of anxiety and depression; denies suicidal thoughts/plans, thoughts of violence, and unusual visions or sounds. Endo:  Denies excessive thirst and excessive urination; tests infrequently. Heme:  Denies abnormal bruising and bleeding. Allergy:  Denies hives or rash and itching eyes.  Physical Exam  General:  Well-developed,obese,in no acute distress; alert,appropriate and cooperative throughout examination HEENT: No facial asymmetry,    EOMI, No sinus tenderness, TM's Clear, oropharynx  pink and moist.   Chest: Clear to auscultation bilaterally.  CVS: S1, S2, No murmurs, No S3.   Abd: Soft, tender below umbilicus with a reducible mass, bowel sounds normal.  MS: decreased  ROM spine,adequate in  hips, shoulders and knees.  Ext: No edema.   CNS: CN 2-12 intact, power tone and sensation normal throughout.   Skin: Intact, no visible lesions or rashes.  Psych: Good eye contact, normal affect.  Memory intact, less anxious  appearing.    Impression & Recommendations:  Problem # 1:  ANXIETY (ICD-300.00) Assessment Unchanged  His updated medication list for this problem includes:    Xanax 1 Mg Tabs (Alprazolam) ..... One tab by mouth four times a day  Problem # 2:  OBESITY, UNSPECIFIED (ICD-278.00) Assessment: Unchanged  Ht: 69 (08/27/2009)   Wt: 277 (08/27/2009)   BMI: 41.05 (08/27/2009)  Problem # 3:  HYPERTENSION (ICD-401.9) Assessment: Improved  The following medications were removed from the medication list:    Benazepril-hydrochlorothiazide 20-25 Mg Tabs (Benazepril-hydrochlorothiazide) ..... One tab by mouth once daily    Clonidine Hcl 0.3 Mg Tabs (Clonidine hcl) ..... One tablet twice daily , at 9am and 9pm His updated medication list for this problem includes:    Tekturna 300 Mg Tabs (Aliskiren fumarate) .Marland Kitchen... Take 1 tablet by mouth once a day    Benazepril-hydrochlorothiazide 20-12.5 Mg Tabs (Benazepril-hydrochlorothiazide) .Marland Kitchen..Marland Kitchen Two tablets once daily    Clonidine Hcl 0.3 Mg Tabs (Clonidine hcl) .Marland KitchenMarland KitchenMarland KitchenMarland Kitchen  Take 1 tab by mouth at bedtime  Orders: T-Basic Metabolic Panel [id number])  BP today: 140/84 Prior BP: 170/104 (06/24/2009)  Labs Reviewed: K+: 4.8 (07/02/2009) Creat: : 0.95 (07/02/2009)   Chol: 240 (07/02/2009)   HDL: 33 (07/02/2009)   LDL: See Comment mg/dL (09/81/1914)   TG: 782 (07/02/2009)  Problem # 4:  DIABETES MELLITUS, TYPE II (ICD-250.00) Assessment: Improved  The following medications  were removed from the medication list:    Benazepril-hydrochlorothiazide 20-25 Mg Tabs (Benazepril-hydrochlorothiazide) ..... One tab by mouth once daily His updated medication list for this problem includes:    Metformin Hcl 1000 Mg Tabs (Metformin hcl) ..... One tab by mouth two times a day    Glipizide 10 Mg Tabs (Glipizide) ..... One tab by mouth bid    Januvia 100 Mg Tabs (Sitagliptin phosphate) ..... One tab by mouth once daily    Benazepril-hydrochlorothiazide 20-12.5 Mg Tabs (Benazepril-hydrochlorothiazide) .Marland Kitchen..Marland Kitchen Two tablets once daily  Orders: T- Hemoglobin A1C ([id number])  Labs Reviewed: Creat: 0.95 (07/02/2009)    Reviewed HgBA1c results: 7.3 (07/02/2009)  9.6 (11/19/2008)  Problem # 5:  HYPERLIPIDEMIA (ICD-272.4) Assessment: Comment Only  His updated medication list for this problem includes:    Crestor 40 Mg Tabs (Rosuvastatin calcium) ..... One tab by mouth at bedtime  Orders: T-Hepatic Function [id number]) T-Lipid Profile [id number])  Labs Reviewed: SGOT: 20 (07/02/2009)   SGPT: 27 (07/02/2009)   HDL:33 (07/02/2009), 31 (01/25/2009)  LDL:See Comment mg/dL (27/25/3664), 403 (47/42/5956)  Chol:240 (07/02/2009), 183 (01/25/2009)  Trig:599 (07/02/2009), 232 (01/25/2009)  Complete Medication List: 1)  Protonix 40 Mg Pack (Pantoprazole sodium) .... One tab by mouth once daily 2)  Ambien 10 Mg Tabs (Zolpidem tartrate) .... One tab by mouth at bedtime 3)  Xanax 1 Mg Tabs (Alprazolam) .... One tab by mouth four times a day 4)  Metformin Hcl 1000 Mg Tabs (Metformin hcl) .... One tab by mouth two times a day 5)  Glipizide 10 Mg Tabs (Glipizide) .... One tab by mouth bid 6)  Tekturna 300 Mg Tabs (Aliskiren fumarate) .... Take 1 tablet by mouth once a day 7)  Oxycodone Hcl 15 Mg Tabs (Oxycodone hcl) .... Take 1 tablet by mouth four times a day 8)  Vesicare 10 Mg Tabs (Solifenacin succinate) .... One tab by mouth once daily  discontinue detrol 9)  Crestor 40 Mg Tabs  (Rosuvastatin calcium) .... One tab by mouth at bedtime 10)  Januvia 100 Mg Tabs (Sitagliptin phosphate) .... One tab by mouth once daily 11)  Benazepril-hydrochlorothiazide 20-12.5 Mg Tabs (Benazepril-hydrochlorothiazide) .... Two tablets once daily 12)  Clonidine Hcl 0.3 Mg Tabs (Clonidine hcl) .... Take 1 tab by mouth at bedtime 13)  Tussionex Pennkinetic Er 8-10 Mg/36ml Lqcr (Chlorpheniramine-hydrocodone) .... Take 5ml two times a day  Other Orders: T-Vitamin D (25-Hydroxy) ([id number])  Patient Instructions: 1)  F/U early October 2)  It is important that you exercise regularly at least 20 minutes 5 times a week. If you develop chest pain, have severe difficulty breathing, or feel very tired , stop exercising immediately and seek medical attention. 3)  You need to lose weight. Consider a lower calorie diet and regular exercise.  4)  BMP prior to visit, ICD-9: 5)  Hepatic Panel prior to visit, ICD-9:   fasting early October 6)  Lipid Panel prior to visit, ICD-9: 7)  HbgA1C prior to visit, ICD-9: 8)  Vitamin D 9)  Increase dose on benazepril /hctz when next due. 10)  Your BP is better Prescriptions: Sandria Senter ER  8-10 MG/5ML LQCR (CHLORPHENIRAMINE-HYDROCODONE) Take 5ml two times a day  #300 x 0   Entered by:   Everitt Amber LPN   Authorized by:   Syliva Overman MD   Signed by:   Everitt Amber LPN on 19/14/7829   Method used:   Historical   RxID:   [id number] CLONIDINE HCL 0.3 MG TABS (CLONIDINE HCL) Take 1 tab by mouth at bedtime  #30 x 3   Entered and Authorized by:   Syliva Overman MD   Signed by:   Syliva Overman MD on 08/27/2009   Method used:   Printed then faxed to ...       [facility] [address] (retail)       [address]       [place], [place]  [address]       Ph: [phone] or [phone]       Fax: [phone]   RxID:   [id number] BENAZEPRIL-HYDROCHLOROTHIAZIDE 20-12.5 MG TABS  (BENAZEPRIL-HYDROCHLOROTHIAZIDE) two tablets once daily  #60 x 3   Entered and Authorized by:   Syliva Overman MD   Signed by:   Syliva Overman MD on 08/27/2009   Method used:   Printed then faxed to ...       [facility] [address] (retail)       [address]       [place], [place]  [address]       Ph: [phone] or [phone]       Fax: [phone]   RxID:   [id number]

## 2010-02-12 NOTE — Progress Notes (Signed)
  Phone Note Call from Patient   Caller: Patient Summary of Call: Manuel Knight states tussinex can be pa'd so he doesnt have to get generic [phone] Initial call taken by: Worthy Keeler LPN,  January 25, 2009 9:58 AM  Follow-up for Phone Call        let them know chronic cough, nurse did this successfully Follow-up by: Syliva Overman MD,  January 25, 2009 1:34 PM

## 2010-02-12 NOTE — Progress Notes (Signed)
  Phone Note From Pharmacy   Caller: [facility] Summary of Call: requesting refill on tussinex Initial call taken by: Adella Hare LPN,  September 30, 2009 10:35 AM  Follow-up for Phone Call        pls refill x 2 Follow-up by: Syliva Overman MD,  September 30, 2009 11:52 AM    Prescriptions: Sandria Senter ER 8-10 MG/5ML LQCR (CHLORPHENIRAMINE-HYDROCODONE) Take 5ml two times a day  #300 x 1   Entered by:   Adella Hare LPN   Authorized by:   Syliva Overman MD   Signed by:   Adella Hare LPN on 01/14/7251   Method used:   Printed then faxed to ...       [facility] [address] (retail)       [address]       [place], [place]  [address]       Ph: [phone] or [phone]       Fax: [phone]   RxID:   [id number]

## 2010-02-12 NOTE — Letter (Signed)
Summary: demographic  demographic   Imported By: Curtis Sites 08/01/2009 10:40:47  _____________________________________________________________________  External Attachment:    Type:   Image     Comment:   External Document

## 2010-02-12 NOTE — Progress Notes (Signed)
Summary: lab order  Phone Note Call from Patient   Summary of Call: pt needs to have a creatine drawn for ct scan of abd.  on 06/27/2009 8:45 Initial call taken by: Rudene Anda,  June 24, 2009 10:40 AM  Follow-up for Phone Call        patient was to have fasting labs today, this will be in the labs ordered Follow-up by: Adella Hare LPN,  June 24, 2009 11:09 AM     Appended Document: lab order pls call this pt, Manuel Knight, pls make him aware that he should have had his bloodwork at the time of his visit las week , he duid not and they are way past due, I am unablet to continue to treat him if he does not have his labs done (including pain management).Pls add a urine drug screen to his labs let him know he needs them in the next 2 days max, hi needs an abdominal scan to eval his pain and this cannot be done withouit labs, [facility]po [facility], not at [facility]  Appended Document: lab order called patient, left message  Appended Document: lab order Patient aware

## 2010-02-12 NOTE — Letter (Signed)
Summary: lab  lab   Imported By: Curtis Sites 08/01/2009 10:51:02  _____________________________________________________________________  External Attachment:    Type:   Image     Comment:   External Document

## 2010-02-12 NOTE — Medication Information (Signed)
Summary: Tax adviser   Imported By: Lind Guest 12/12/2009 08:56:24  _____________________________________________________________________  External Attachment:    Type:   Image     Comment:   External Document

## 2010-02-12 NOTE — Assessment & Plan Note (Signed)
Summary: F UP   Vital Signs:  Patient profile:   50 year old male Height:      69 inches Weight:      270.75 pounds BMI:     40.13 O2 Sat:      98 % on Room air Pulse rate:   95 / minute Pulse rhythm:   regular Resp:     16 per minute BP sitting:   160 / 110  (left arm)  Vitals Entered By: Mauricia Area CMA  Nutrition Counseling: Patient's BMI is greater than 25 and therefore counseled on weight management options.  O2 Flow:  Room air CC: follow up Is Patient Diabetic? Yes Pain Assessment Patient in pain? no        Primary Care Provider:  Syliva Overman MD  CC:  follow up.  History of Present Illness: Reports  that he jhas been doing well. He states he has been takig his meds as prescribed, and actually has them , however his BP is markedly elevated.j Denies recent fever or chills. Denies sinus pressure, nasal congestion , ear pain or sore throat. Denies chest congestion, or cough productive of sputum. Denies chest pain, palpitations, PND, orthopnea or leg swelling. Denies abdominal pain, nausea, vomitting, diarrhea or constipation. Denies change in bowel movements or bloody stool. Denies dysuria , frequency, incontinence or hesitancy. Denies  joint pain, swelling, or reduced mobility. Denies headaches, vertigo, seizures. Denies depression, anxiety or insomnia. Denies  rash, lesions, or itch. Pt stetes he tests his sugars regulalrly , and they are se;ldom out of range.     Current Medications (verified): 1)  Protonix 40 Mg  Pack (Pantoprazole Sodium) .... One Tab By Mouth Once Daily 2)  Metformin Hcl 1000 Mg  Tabs (Metformin Hcl) .... One Tab By Mouth Two Times A Day 3)  Glipizide 10 Mg Tabs (Glipizide) .... One Tab By Mouth Bid 4)  Tekturna 300 Mg Tabs (Aliskiren Fumarate) .... Take 1 Tablet By Mouth Once A Day 5)  Oxycodone Hcl 15 Mg Tabs (Oxycodone Hcl) .... Take 1 Tablet By Mouth Four Times A Day 6)  Vesicare 10 Mg Tabs (Solifenacin Succinate) .... One  Tab By Mouth Once Daily  Discontinue Detrol 7)  Crestor 40 Mg Tabs (Rosuvastatin Calcium) .... One Tab By Mouth At Bedtime 8)  Januvia 100 Mg Tabs (Sitagliptin Phosphate) .... One Tab By Mouth Once Daily 9)  Benazepril-Hydrochlorothiazide 20-12.5 Mg Tabs (Benazepril-Hydrochlorothiazide) .... Two Tablets Once Daily 10)  Clonidine Hcl 0.3 Mg Tabs (Clonidine Hcl) .... Take 1 Tab By Mouth At Bedtime  Allergies (verified): 1)  ! Darvocet  Review of Systems      See HPI General:  Complains of fatigue. Eyes:  Denies discharge, eye pain, and red eye. MS:  Complains of joint pain, low back pain, muscle aches, and stiffness. Psych:  Complains of anxiety; denies depression. Endo:  Denies cold intolerance, excessive thirst, excessive urination, and heat intolerance. Heme:  Denies abnormal bruising and bleeding. Allergy:  Complains of seasonal allergies.  Physical Exam  General:  Well-developed,obese,in no acute distress; alert,appropriate and cooperative throughout examination HEENT: No facial asymmetry,  EOMI, No sinus tenderness, TM's Clear, oropharynx  pink and moist.   Chest: Clear to auscultation bilaterally.  CVS: S1, S2, No murmurs, No S3.   Abd: Soft, tender below umbilicus with a reducible mass, bowel sounds normal.  MS: decreased  ROM spine,adequate in  hips, shoulders and knees.  Ext: No edema.   CNS: CN 2-12 intact, power tone and  sensation normal throughout.   Skin: Intact, no visible lesions or rashes.  Psych: Good eye contact, normal affect.  Memory intact, less anxious  appearing.    Impression & Recommendations:  Problem # 1:  ANXIETY (ICD-300.00) Assessment Unchanged  The following medications were removed from the medication list:    Xanax 1 Mg Tabs (Alprazolam) ..... One tab by mouth four times a day  Problem # 2:  BACK PAIN, CHRONIC (ICD-724.5) Assessment: Unchanged  His updated medication list for this problem includes:    Oxycodone Hcl 15 Mg Tabs (Oxycodone  hcl) .Marland Kitchen... Take 1 tablet by mouth four times a day  Problem # 3:  DIABETES MELLITUS, TYPE II (ICD-250.00) Assessment: Comment Only  His updated medication list for this problem includes:    Metformin Hcl 1000 Mg Tabs (Metformin hcl) ..... One tab by mouth two times a day    Glipizide 10 Mg Tabs (Glipizide) ..... One tab by mouth bid    Januvia 100 Mg Tabs (Sitagliptin phosphate) ..... One tab by mouth once daily    Benazepril-hydrochlorothiazide 20-12.5 Mg Tabs (Benazepril-hydrochlorothiazide) .Marland Kitchen..Marland Kitchen Two tablets once daily Patient advised to reduce carbs and sweets, commit to regular physical activity, take meds as prescribed, test blood sugars as directed, and attempt to lose weight , to improve blood sugar control.  Labs Reviewed: Creat: 0.95 (07/02/2009)    Reviewed HgBA1c results: 7.3 (07/02/2009)  9.6 (11/19/2008)  Problem # 4:  OBESITY, UNSPECIFIED (ICD-278.00) Assessment: Deteriorated  Ht: 69 (10/17/2009)   Wt: 270.75 (10/17/2009)   BMI: 40.13 (10/17/2009)  Problem # 5:  HYPERTENSION (ICD-401.9) Assessment: Deteriorated  His updated medication list for this problem includes:    Tekturna 300 Mg Tabs (Aliskiren fumarate) .Marland Kitchen... Take 1 tablet by mouth once a day    Benazepril-hydrochlorothiazide 20-12.5 Mg Tabs (Benazepril-hydrochlorothiazide) .Marland Kitchen..Marland Kitchen Two tablets once daily    Clonidine Hcl 0.3 Mg Tabs (Clonidine hcl) .Marland Kitchen... Take 1 tab by mouth at bedtime  BP today: 160/110 Prior BP: 140/84 (08/27/2009)  Labs Reviewed: K+: 4.8 (07/02/2009) Creat: : 0.95 (07/02/2009)   Chol: 240 (07/02/2009)   HDL: 33 (07/02/2009)   LDL: See Comment mg/dL (16/10/9602)   TG: 540 (07/02/2009)  Complete Medication List: 1)  Protonix 40 Mg Pack (Pantoprazole sodium) .... One tab by mouth once daily 2)  Metformin Hcl 1000 Mg Tabs (Metformin hcl) .... One tab by mouth two times a day 3)  Glipizide 10 Mg Tabs (Glipizide) .... One tab by mouth bid 4)  Tekturna 300 Mg Tabs (Aliskiren fumarate) .... Take  1 tablet by mouth once a day 5)  Oxycodone Hcl 15 Mg Tabs (Oxycodone hcl) .... Take 1 tablet by mouth four times a day 6)  Vesicare 10 Mg Tabs (Solifenacin succinate) .... One tab by mouth once daily  discontinue detrol 7)  Crestor 40 Mg Tabs (Rosuvastatin calcium) .... One tab by mouth at bedtime 8)  Januvia 100 Mg Tabs (Sitagliptin phosphate) .... One tab by mouth once daily 9)  Benazepril-hydrochlorothiazide 20-12.5 Mg Tabs (Benazepril-hydrochlorothiazide) .... Two tablets once daily 10)  Clonidine Hcl 0.3 Mg Tabs (Clonidine hcl) .... Take 1 tab by mouth at bedtime  Patient Instructions: 1)  Please schedule a follow-up appointment in 3 months. 2)  It is important that you exercise regularly at least 20 minutes 5 times a week. If you develop chest pain, have severe difficulty breathing, or feel very tired , stop exercising immediately and seek medical attention. 3)  You need to lose weight. Consider a lower calorie diet  and regular exercise.  4)  Fasting labs asap Prescriptions: JANUVIA 100 MG TABS (SITAGLIPTIN PHOSPHATE) one tab by mouth once daily  #30 x 3   Entered by:   Adella Hare LPN   Authorized by:   Syliva Overman MD   Signed by:   Adella Hare LPN on 16/10/9602   Method used:   Electronically to        [facility] [address] (retail)       [address]       [place], [place]  [address]       Ph: [phone] or [phone]       Fax: [phone]   RxID:   [id number] CRESTOR 40 MG TABS (ROSUVASTATIN CALCIUM) one tab by mouth at bedtime  #30 x 3   Entered by:   Adella Hare LPN   Authorized by:   Syliva Overman MD   Signed by:   Adella Hare LPN on 40/34/7425   Method used:   Electronically to        [facility] [address] (retail)       [address]       [place], [place]  [address]       Ph: [phone] or [phone]       Fax: [phone]   RxID:   [id number] VESICARE 10 MG TABS  (SOLIFENACIN SUCCINATE) one tab by mouth once daily  discontinue detrol  #30 Each x 3   Entered by:   Adella Hare LPN   Authorized by:   Syliva Overman MD   Signed by:   Adella Hare LPN on 62/83/1517   Method used:   Electronically to        [facility] [address] (retail)       [address]       [place], [place]  [address]       Ph: [phone] or [phone]       Fax: [phone]   RxID:   [id number] GLIPIZIDE 10 MG TABS (GLIPIZIDE) one tab by mouth bid  #60 Each x 3   Entered by:   Adella Hare LPN   Authorized by:   Syliva Overman MD   Signed by:   Adella Hare LPN on 25/85/2778   Method used:   Electronically to        [facility] [address] (retail)       [address]       [place], [place]  [address]       Ph: [phone] or [phone]       Fax: [phone]   RxID:   [id number] METFORMIN HCL 1000 MG  TABS (METFORMIN HCL) one tab by mouth two times a day  #60 Each x 3   Entered by:   Adella Hare LPN   Authorized by:   Syliva Overman MD   Signed by:   Adella Hare LPN on 41/93/7902   Method used:   Electronically to        [facility] [address] (retail)       [address]       [place], [place]  [address]       Ph: [phone] or [phone]  Fax: [phone]   RxID:   [id number] PROTONIX 40 MG  PACK (PANTOPRAZOLE SODIUM) one tab by mouth once daily  #30 Each x 3   Entered by:   Adella Hare LPN   Authorized by:   Syliva Overman MD   Signed by:   Adella Hare LPN on 30/86/5784   Method used:   Electronically to        [facility] [address] (retail)       [address]       [place], [place]  [address]       Ph: [phone] or [phone]       Fax: [phone]   RxID:   [id number]

## 2010-02-12 NOTE — Progress Notes (Signed)
Summary: cramps in legs  Phone Note Call from Patient   Summary of Call: having really bad cramps in left leg and in right. what can he do? [phone] Initial call taken by: Rudene Anda,  April 18, 2009 11:05 AM  Follow-up for Phone Call        needs blood work, past due HBA1C chem 7, can check Mg and calcium also this may give the answer in the meantime take one potassium oTC 99mg  tab, pls print lab request if he does not have one I think he should Follow-up by: Syliva Overman MD,  April 18, 2009 3:01 PM  Additional Follow-up for Phone Call Additional follow up Details #1::        Phone Call Completed Additional Follow-up by: Adella Hare LPN,  April 18, 2009 4:56 PM

## 2010-02-12 NOTE — Progress Notes (Signed)
Summary: cough medicine  Phone Note Call from Patient   Summary of Call: would like to get his cough medicine back again. will doc please do this for him.  he can get tussinex again but get in generic Initial call taken by: Rudene Anda,  January 18, 2009 8:30 AM  Follow-up for Phone Call        advise and erx tussionex 5cc twice dailyas neded x 300cc Follow-up by: Syliva Overman MD,  January 18, 2009 10:05 AM  Additional Follow-up for Phone Call Additional follow up Details #1::        Rx Called In, left detailed message for patient Additional Follow-up by: Worthy Keeler LPN,  January 18, 2009 3:24 PM    New/Updated Medications: Sandria Senter ER 8-10 MG/5ML LQCR (CHLORPHENIRAMINE-HYDROCODONE) 5cc by mouth two times a day prn Prescriptions: TUSSIONEX PENNKINETIC ER 8-10 MG/5ML LQCR (CHLORPHENIRAMINE-HYDROCODONE) 5cc by mouth two times a day prn  #300cc x 0   Entered by:   Worthy Keeler LPN   Authorized by:   Syliva Overman MD   Signed by:   Worthy Keeler LPN on 16/10/9602   Method used:   Printed then faxed to ...       [facility] [address] (retail)       [address]       [place], [place]  [address]       Ph: [phone] or [phone]       Fax: [phone]   RxID:   [id number]

## 2010-02-12 NOTE — Progress Notes (Signed)
Summary: refill  Phone Note Call from Patient   Summary of Call: pt would like to know why his cough meds wasn't sent over yesterday. [phone] Initial call taken by: Rudene Anda,  August 30, 2009 10:52 AM  Follow-up for Phone Call        Just got the refill request and sent it [facility] to [facility] Follow-up by: Everitt Amber LPN,  August 30, 2009 1:11 PM

## 2010-02-12 NOTE — Miscellaneous (Signed)
Summary: [facility] REFILL  Clinical Lists Changes  Medications: Rx of OXYCODONE HCL 15 MG TABS (OXYCODONE HCL) Take 1 tablet by mouth four times a day;  #120 x 0;  Signed;  Entered by: Everitt Amber;  Authorized by: Syliva Overman MD;  Method used: Handwritten    Prescriptions: OXYCODONE HCL 15 MG TABS (OXYCODONE HCL) Take 1 tablet by mouth four times a day  #120 x 0   Entered by:   Everitt Amber   Authorized by:   Syliva Overman MD   Signed by:   Everitt Amber on 02/18/2009   Method used:   Handwritten   RxID:   [id number]

## 2010-02-13 ENCOUNTER — Encounter: Payer: Self-pay | Admitting: Family Medicine

## 2010-02-13 ENCOUNTER — Telehealth: Payer: Self-pay | Admitting: Family Medicine

## 2010-02-13 LAB — CONVERTED CEMR LAB
Amphetamine Screen, Ur: NEGATIVE
Benzodiazepines.: POSITIVE — AB
Cocaine Metabolites: NEGATIVE

## 2010-02-13 NOTE — Progress Notes (Signed)
Summary: someone to ck sugar  Phone Note Call from Patient   Summary of Call: wants to know did you ck with someone about getting to ck his sugar call back to let him know Initial call taken by: Lind Guest,  December 25, 2009 8:47 AM  Follow-up for Phone Call        PATIENT CALLED IN STATES THAT HE IS OUT OF DIABETE TEST STRIPP.Marland Kitchen HAVE TO  TEST SUGAR 3 TIMES DAILY.Marland Kitchen NEED STRIP ASAP Follow-up by: Eugenio Hoes,  December 25, 2009 3:30 PM  Additional Follow-up for Phone Call Additional follow up Details #1::        spoke with [facility] gave verbal to send testing supplies and for once daily, patient should not be on insulin anymore also offered samples strips from office Additional Follow-up by: Adella Hare LPN,  December 25, 2009 3:58 PM

## 2010-02-13 NOTE — Progress Notes (Signed)
  Phone Note Outgoing Call   Call placed by: dr simpson Summary of Call: pls advise pt that he absolutely needs to get his labs done which are way past due, it  is essential that I know what these are Initial call taken by: Syliva Overman MD,  January 28, 2010 8:08 AM  Follow-up for Phone Call        I advised patient he needed to do labs asap when he was here for his last visit and even reprinted his labsheet for him. Mailed him a reminder letter Follow-up by: Everitt Amber LPN,  January 28, 2010 8:37 AM

## 2010-02-13 NOTE — Progress Notes (Signed)
Summary: medicine  Phone Note Call from Patient   Summary of Call: left message why was his alp. called into phar. Initial call taken by: Lind Guest,  January 14, 2010 7:56 AM  Follow-up for Phone Call        Rx Called In Follow-up by: Adella Hare LPN,  January 14, 2010 2:33 PM

## 2010-02-13 NOTE — Progress Notes (Signed)
  Phone Note Outgoing Call   Call placed by: dr Emiah Pellicano Summary of Call: pls call pt , let him, know he needs to stop tekturna since he is on benazepril also for his blood pressure , this is in accordance with new warnings,I would like him to increase the clonidine to one twice daily instead,12 houtrs apart eg 7am and 7pm. If he has already taken tekturna today, then starttomorrow, if he has not taken today, then start today. Pls also notify pharmacy and fax in.PLS check with pt/pharmacy he is actually taking clonidine 0.3mg  once daily at this time, if not let me know before faxing in dose inc/advising it  Pls also let pt know per the discussion, when he comes to collect narc scripts, he will be required to submit urine in the office for testing at random intervals, before he can collect the script Initial call taken by: Syliva Overman MD,  January 22, 2010 11:10 AM  Follow-up for Phone Call        called patient, left message Follow-up by: Everitt Amber LPN,  January 22, 2010 12:50 PM  Additional Follow-up for Phone Call Additional follow up Details #1::        Patient aware to d/c tekturna and to increase clonidine to two times a day. Also aware of the urine drug screen to be done before narc pickup Additional Follow-up by: Everitt Amber LPN,  January 22, 2010 1:01 PM    New/Updated Medications: CLONIDINE HCL 0.3 MG TABS (CLONIDINE HCL) Take 1 tablet by mouth two times a day, take twelve hours apart on a regular schedule Prescriptions: CLONIDINE HCL 0.3 MG TABS (CLONIDINE HCL) Take 1 tablet by mouth two times a day, take twelve hours apart on a regular schedule  #60 x 3   Entered and Authorized by:   Syliva Overman MD   Signed by:   Syliva Overman MD on 01/22/2010   Method used:   Printed then faxed to ...       [facility] [address] (retail)       [address]       [place], [place]  [address]       Ph: [phone] or [phone]       Fax:  [phone]   RxID:   [id number]

## 2010-02-13 NOTE — Progress Notes (Signed)
Summary: written rx  Phone Note Call from Patient   Summary of Call: please call pt about his written rx [phone] Initial call taken by: Rudene Anda,  February 07, 2010 11:22 AM  Follow-up for Phone Call        Advised him that it would be ready monday  Follow-up by: Everitt Amber LPN,  February 07, 2010 1:15 PM

## 2010-02-13 NOTE — Letter (Signed)
Summary: d/c tekturna  d/c tekturna   Imported By: Lind Guest 01/23/2010 08:56:38  _____________________________________________________________________  External Attachment:    Type:   Image     Comment:   External Document

## 2010-02-13 NOTE — Letter (Signed)
Summary: Letter  Letter   Imported By: Lind Guest 01/28/2010 13:30:22  _____________________________________________________________________  External Attachment:    Type:   Image     Comment:   External Document

## 2010-02-13 NOTE — Progress Notes (Signed)
Summary: CHANGE OF MEDICINE  Phone Note Call from Patient   Summary of Call: LEFT MESSAGE ABOUT CHANGING HIS MEDICINE AND THE SAME MEDICINE WAS AT THE PHARMACY AND WANTS SOMEONE TO CALL HIM BACK HE DOES NOT KNOW WHAT IS GOING ON Initial call taken by: Lind Guest,  January 27, 2010 8:02 AM  Follow-up for Phone Call        patient understands clonidine was increased to two times a day and tekturna discontinued Follow-up by: Adella Hare LPN,  January 27, 2010 1:30 PM    New/Updated Medications: CLONIDINE HCL 0.3 MG TABS (CLONIDINE HCL) Take 1 tablet by mouth two times a day, take twelve hours apart on a regular schedule (#60) Prescriptions: CLONIDINE HCL 0.3 MG TABS (CLONIDINE HCL) Take 1 tablet by mouth two times a day, take twelve hours apart on a regular schedule (#60)  #60 x 2   Entered by:   Adella Hare LPN   Authorized by:   Syliva Overman MD   Signed by:   Adella Hare LPN on 16/10/9602   Method used:   Electronically to        [facility] [address] (retail)       [address]       [place], [place]  [address]       Ph: [phone] or [phone]       Fax: [phone]   RxID:   [id number]

## 2010-02-13 NOTE — Medication Information (Signed)
Summary: Tax adviser   Imported By: Lind Guest 12/31/2009 13:32:29  _____________________________________________________________________  External Attachment:    Type:   Image     Comment:   External Document

## 2010-02-13 NOTE — Assessment & Plan Note (Signed)
Summary: F UP   Vital Signs:  Patient profile:   50 year old male Height:      69 inches Weight:      280 pounds BMI:     41.50 O2 Sat:      97 % Pulse rate:   94 / minute Pulse rhythm:   regular Resp:     16 per minute BP sitting:   150 / 100  (left arm) Cuff size:   xl  Vitals Entered By: Everitt Amber LPN (January 21, 2010 8:09 AM)  Nutrition Counseling: Patient's BMI is greater than 25 and therefore counseled on weight management options. CC: Follow up chronic problems   Primary Care Provider:  Syliva Overman MD  CC:  Follow up chronic problems.  History of Present Illness: Pt states after stopping marijuana  for 1 month,reports resuming approx 2.5 to 3 weeks ago, states he became irritable,could not get on with his "old lady", increased stomach pain, and also experienced increased pain. I have explained clearly to the pt that it is not medically safe to be using marijuana and pain meds that he is on. I have specifically told him today that he already has signed a contract not to use marijuana or other street drugs while on pain meds. I offer him referral to psychiatrty as well as pain management however he refuses both, and states he will likely end up in the hospital "because of his nerves' almopst implicating me as the cause , since I am "unwilling to work with him". Alos stating he may well leave his "old lady" rather than be irritable around her. I offered the option of seeking a new PCP, he then stated I was "trying to get rid of him", I told him that if he was unwilling/ unable to comply to the treatment options offered I would no longer be able to provide care. He left the office extremely unhappy, with no clear stated plan, he did say he will will stop the marijuna, he is aware that random driug screens will be done to ensure compliance.    Preventive Screening-Counseling & Management  Alcohol-Tobacco     Smoking Cessation Counseling: yes  Allergies: 1)  !  Darvocet  Review of Systems      See HPI General:  Complains of fatigue. Eyes:  Denies blurring and discharge. ENT:  Denies hoarseness and nasal congestion. CV:  Denies chest pain or discomfort and swelling of feet. Resp:  Denies cough and sputum productive. GI:  Complains of abdominal pain; denies constipation, diarrhea, nausea, and vomiting; chronic generalised abdominal pain. GU:  Denies dysuria and urinary frequency. MS:  Complains of joint pain, low back pain, mid back pain, and stiffness. Derm:  Complains of itching and rash; picks at skni, states nerves are bad, reports "breaking out " on arms intermittently. Psych:  Complains of anxiety and irritability; denies suicidal thoughts/plans, thoughts of violence, and unusual visions or sounds. Endo:  Denies excessive thirst and excessive urination. Heme:  Denies abnormal bruising and bleeding. Allergy:  Denies hives or rash and itching eyes.  Physical Exam  General:  Well-developed,obese,in no acute distress; alert,appropriate and irritable and anxious. HEENT: No facial asymmetry,  EOMI, No sinus tenderness,  oropharynx  pink and moist.   Chest: Clear to auscultation bilaterally.Decreased air entry bilaterally  CVS: S1, S2, No murmurs, No S3.   Abd: Soft, Nontender.  MS: decreassed  ROM spine, hips, shoulders and knees.  Ext: No edema.   CNS: CN 2-12  intact, power tone and sensation normal throughout.   Skin: Intact, no visible lesions or rashes.  Psych: Good eye contact, t.  Memory intact, not anxious   Impression & Recommendations:  Problem # 1:  ABDOMINAL PAIN (ICD-789.00) Assessment Deteriorated advised pt no change in chronnic pain management , and presented option which was refused to seek pain management  Problem # 2:  ANXIETY (ICD-300.00) Assessment: Deteriorated  His updated medication list for this problem includes:    Alprazolam 1 Mg Tabs (Alprazolam) ..... One tab by mouth four times daily, no change will be  made in dosing, pt exhibiting increased dependence and making demands which are unreasonable, and unwilling to seek specialist  help with management  Problem # 3:  DIABETES MELLITUS, TYPE II (ICD-250.00) Assessment: Comment Only  His updated medication list for this problem includes:    Metformin Hcl 1000 Mg Tabs (Metformin hcl) ..... One tab by mouth two times a day    Glipizide 10 Mg Tabs (Glipizide) ..... One tab by mouth bid    Januvia 100 Mg Tabs (Sitagliptin phosphate) ..... One tab by mouth once daily    Benazepril-hydrochlorothiazide 20-12.5 Mg Tabs (Benazepril-hydrochlorothiazide) .Marland Kitchen..Marland Kitchen Two tablets once daily  Labs Reviewed: Creat: 0.95 (07/02/2009)    Reviewed HgBA1c results: 7.3 (07/02/2009), labs past due, non compliance with lab order a challenge  9.6 (11/19/2008)  Problem # 4:  HYPERTENSION (ICD-401.9) Assessment: Deteriorated  His updated medication list for this problem includes:    Benazepril-hydrochlorothiazide 20-12.5 Mg Tabs (Benazepril-hydrochlorothiazide) .Marland Kitchen..Marland Kitchen Two tablets once daily    Clonidine Hcl 0.3 Mg Tabs (Clonidine hcl) .Marland Kitchen... Take 1 tablet by mouth two times a day, take twelve hours apart on a regular schedule (#60) Patient advised to follow low sodium diet rich in fruit and vegetables, and to commit to at least 30 minutes 5 days per week of regular exercise , to improve blood presure control.   BP today: 150/100 Prior BP: 124/80 (12/11/2009)  Labs Reviewed: K+: 4.8 (07/02/2009) Creat: : 0.95 (07/02/2009)   Chol: 240 (07/02/2009)   HDL: 33 (07/02/2009)   LDL: See Comment mg/dL (16/10/9602)   TG: 540 (07/02/2009)  Complete Medication List: 1)  Protonix 40 Mg Pack (Pantoprazole sodium) .... One tab by mouth once daily 2)  Metformin Hcl 1000 Mg Tabs (Metformin hcl) .... One tab by mouth two times a day 3)  Glipizide 10 Mg Tabs (Glipizide) .... One tab by mouth bid 4)  Oxycodone Hcl 15 Mg Tabs (Oxycodone hcl) .... Take 1 tablet by mouth four times a  day 5)  Vesicare 10 Mg Tabs (Solifenacin succinate) .... One tab by mouth once daily  discontinue detrol 6)  Crestor 40 Mg Tabs (Rosuvastatin calcium) .... One tab by mouth at bedtime 7)  Januvia 100 Mg Tabs (Sitagliptin phosphate) .... One tab by mouth once daily 8)  Benazepril-hydrochlorothiazide 20-12.5 Mg Tabs (Benazepril-hydrochlorothiazide) .... Two tablets once daily 9)  Alprazolam 1 Mg Tabs (Alprazolam) .... One tab by mouth four times daily 10)  Tussionex Pennkinetic Er 10-8 Mg/52ml Lqcr (Hydrocod polst-chlorphen polst) .... One and half tsp by mouth at bedtime 11)  Ambien 10 Mg Tabs (Zolpidem tartrate) .... One tab by mouth at bedtime 12)  Clonidine Hcl 0.3 Mg Tabs (Clonidine hcl) .... Take 1 tablet by mouth two times a day, take twelve hours apart on a regular schedule (#60)  Patient Instructions: 1)  Please schedule a follow-up appointment in 2 months. 2)  Tobacco is very bad for your health and your loved  ones! You Should stop smoking!. 3)  It is important that you exercise regularly at least 20 minutes 5 times a week. If you develop chest pain, have severe difficulty breathing, or feel very tired , stop exercising immediately and seek medical attention. 4)  You need to lose weight. Consider a lower calorie diet and regular exercise.  5)  RANDOM DRUG screens will be done regularly. 6)  You need to quit marijuana use today. 7)  If you continue then I will not prescribe pain medications anymore. 8)  I recommend you seriously consider finding a new provider since the options of seeing mental health for your anxiety, and a pain specialist for your pain manaagement are both unacceptable to you. 9)  Fasting labs andare to be done today they are overdue. 10)      Orders Added: 1)  Est. Patient Level IV [13086]

## 2010-02-14 ENCOUNTER — Telehealth: Payer: Self-pay | Admitting: Family Medicine

## 2010-02-14 ENCOUNTER — Encounter: Payer: Self-pay | Admitting: Family Medicine

## 2010-02-17 ENCOUNTER — Telehealth (INDEPENDENT_AMBULATORY_CARE_PROVIDER_SITE_OTHER): Payer: Self-pay | Admitting: *Deleted

## 2010-02-18 ENCOUNTER — Encounter: Payer: Self-pay | Admitting: Family Medicine

## 2010-02-18 ENCOUNTER — Telehealth: Payer: Self-pay | Admitting: Family Medicine

## 2010-02-19 NOTE — Medication Information (Signed)
Summary: Tax adviser   Imported By: Lind Guest 02/10/2010 11:30:21  _____________________________________________________________________  External Attachment:    Type:   Image     Comment:   External Document

## 2010-02-19 NOTE — Progress Notes (Signed)
Summary: medicine  Phone Note Call from Patient   Summary of Call: left message to please fax over his medicine and let hin know Initial call taken by: Lind Guest,  February 14, 2010 7:46 AM  Follow-up for Phone Call        patient called back and would like to get his xanax refilled Follow-up by: Rudene Anda,  February 14, 2010 8:31 AM    Prescriptions: ALPRAZOLAM 1 MG TABS (ALPRAZOLAM) one tab by mouth four times daily  #120 x 0   Entered by:   Adella Hare LPN   Authorized by:   Syliva Overman MD   Signed by:   Adella Hare LPN on 04/54/0981   Method used:   Printed then faxed to ...       [facility] [address] (retail)       [address]       [place], [place]  [address]       Ph: [phone] or [phone]       Fax: [phone]   RxID:   [id number]

## 2010-02-19 NOTE — Letter (Signed)
Summary: certified mail receipt  certified mail receipt   Imported By: Lind Guest 02/14/2010 15:41:16  _____________________________________________________________________  External Attachment:    Type:   Image     Comment:   External Document

## 2010-02-19 NOTE — Letter (Signed)
Summary: discharged  discharged   Imported By: Lind Guest 02/13/2010 11:13:53  _____________________________________________________________________  External Attachment:    Type:   Image     Comment:   External Document

## 2010-02-19 NOTE — Miscellaneous (Signed)
Summary: Orders Update  Clinical Lists Changes  Orders: Added new Test order of T- * Misc. Laboratory test (99999) - Signed 

## 2010-02-19 NOTE — Progress Notes (Signed)
Summary: medicine  Phone Note Call from Patient   Summary of Call: want send in his clonidine Dr.  wanting him to take 2 a day    [facility] Initial call taken by: Lind Guest,  February 13, 2010 1:48 PM    Prescriptions: CLONIDINE HCL 0.3 MG TABS (CLONIDINE HCL) Take 1 tablet by mouth two times a day, take twelve hours apart on a regular schedule (#60)  #60 x 0   Entered by:   Adella Hare LPN   Authorized by:   Syliva Overman MD   Signed by:   Adella Hare LPN on 78/29/5621   Method used:   Electronically to        [facility] [address] (retail)       [address]       [place], [place]  [address]       Ph: [phone] or [phone]       Fax: [phone]   RxID:   [id number]

## 2010-02-26 ENCOUNTER — Telehealth: Payer: Self-pay | Admitting: Family Medicine

## 2010-02-26 ENCOUNTER — Telehealth (INDEPENDENT_AMBULATORY_CARE_PROVIDER_SITE_OTHER): Payer: Self-pay | Admitting: *Deleted

## 2010-02-27 ENCOUNTER — Encounter: Payer: Self-pay | Admitting: Family Medicine

## 2010-02-27 NOTE — Progress Notes (Signed)
Summary: speak with doc  Phone Note Call from Patient   Summary of Call: pt would like to speak with dr. Lodema Hong about being discharged. [phone] Initial call taken by: Rudene Anda,  February 17, 2010 2:48 PM  Follow-up for Phone Call        I know you do not wish to talk to this patient but I don't have time today. If you let me know what to say to him I will be happy to call him for you tomorrow Follow-up by: Everitt Amber LPN,  February 17, 2010 3:20 PM  Additional Follow-up for Phone Call Additional follow up Details #1::        pls let him know I will return his call tomorrow Additional Follow-up by: Syliva Overman MD,  February 17, 2010 4:39 PM    Additional Follow-up for Phone Call Additional follow up Details #2::    pt was notified and told message. And was fine with doc calling him tomorrow Follow-up by: Rudene Anda,  February 17, 2010 4:54 PM

## 2010-02-27 NOTE — Progress Notes (Signed)
  Phone Note Outgoing Call   Summary of Call: Call placed to pt explaining the reason for termination positive marijuana screen in his urine states he is lying in his bed suffering, because he stopped the marijuana, states he just does not understand. I told pt he had a positive drug screen, for marijuana, i advised I was responsible for all his meds for 30 days up until yesterday , which was when he received the letter, I advised I would happily send his records to his new health care provider on request and wished him all the best. The office manager was present at the time of the call Initial call taken by: Syliva Overman MD,  February 18, 2010 11:49 AM

## 2010-02-27 NOTE — Letter (Signed)
Summary: TO PHARMACIST  TO PHARMACIST   Imported By: Lind Guest 02/19/2010 11:16:37  _____________________________________________________________________  External Attachment:    Type:   Image     Comment:   External Document

## 2010-03-04 ENCOUNTER — Other Ambulatory Visit: Payer: Self-pay | Admitting: Neurology

## 2010-03-04 DIAGNOSIS — R51 Headache: Secondary | ICD-10-CM

## 2010-03-05 NOTE — Progress Notes (Signed)
Summary: headache  Phone Note Call from Patient   Summary of Call: right side of head hurting really bad in the middle of night. states like shooting pains. wakes him up out of a dead sleep. when he gets up in moning the right side of nose has dried blood. [phone] Initial call taken by: Rudene Anda,  February 26, 2010 9:15 AM  Follow-up for Phone Call        pls try and get appt with Dr Gerilyn Pilgrim re the headache this week, let me know if possible. also advise that he go to the Ed since he has new severe headaches Follow-up by: Syliva Overman MD,  February 26, 2010 9:19 AM  Additional Follow-up for Phone Call Additional follow up Details #1::        pt has appt at dr. Harriett Sine for 03/04/2010 11:00. pt was called and notified Additional Follow-up by: Rudene Anda,  February 26, 2010 10:55 AM

## 2010-03-05 NOTE — Progress Notes (Signed)
  Phone Note From Pharmacy   Caller: [facility] Summary of Call: refill request for tussinex Initial call taken by: Adella Hare LPN,  February 26, 2010 4:53 PM  Follow-up for Phone Call        quntity written to las till March 7 pls send in Follow-up by: Syliva Overman MD,  February 26, 2010 5:30 PM    New/Updated Medications: Sandria Senter ER 10-8 MG/5ML LQCR (HYDROCOD POLST-CHLORPHEN POLST) one and a half teaspoons at bedtime, as needed for cough Prescriptions: TUSSIONEX PENNKINETIC ER 10-8 MG/5ML LQCR (HYDROCOD POLST-CHLORPHEN POLST) one and a half teaspoons at bedtime, as needed for cough  #165cc x 0   Entered by:   Adella Hare LPN   Authorized by:   Syliva Overman MD   Signed by:   Adella Hare LPN on 30/86/5784   Method used:   Printed then faxed to ...       [facility] [address] (retail)       [address]       [place], [place]  [address]       Ph: [phone] or [phone]       Fax: [phone]   RxID:   [id number] Sandria Senter ER 10-8 MG/5ML LQCR (HYDROCOD POLST-CHLORPHEN POLST) one and a half teaspoons at bedtime, as needed for cough  #165cc x 0   Entered and Authorized by:   Syliva Overman MD   Signed by:   Syliva Overman MD on 02/26/2010   Method used:   Historical   RxID:   [id number]

## 2010-03-08 ENCOUNTER — Other Ambulatory Visit: Payer: Self-pay

## 2010-03-10 ENCOUNTER — Ambulatory Visit: Payer: Medicare Other | Attending: Neurology

## 2010-03-12 ENCOUNTER — Telehealth: Payer: Self-pay | Admitting: Family Medicine

## 2010-03-12 ENCOUNTER — Encounter: Payer: Self-pay | Admitting: Family Medicine

## 2010-03-17 ENCOUNTER — Other Ambulatory Visit: Payer: Self-pay

## 2010-03-19 ENCOUNTER — Encounter: Payer: Self-pay | Admitting: Family Medicine

## 2010-03-20 NOTE — Progress Notes (Signed)
Summary: wants to talk o DR  Phone Note Call from Patient   Summary of Call: after leaving patient called back and wants Dr. to call him back.  aLSO DR.[facility] OFFICE CALLED AND SAID THAT THEY ONLY SEEN HIM FOR HEADACHES NOT PAIN MANAGEMENT. HE CALLED THERE, SAYING THAT SOMONE FROM [facility]ID TO CALL THERE TO GET MEDICINE Initial call taken by: Lind Guest,  March 12, 2010 9:14 AM  Follow-up for Phone Call        pls spk with this pt as the office manager , thank you. Follow-up by: Syliva Overman MD,  March 12, 2010 9:31 AM  Additional Follow-up for Phone Call Additional follow up Details #1::        Doneen Poisson states thast she spoke with the patient, and she is sending a nmmessage to risk management, the contents of the conversation I suppose are documented , howevere I understand that this pt would hardly give her a chance to say anything and that he stated he "would be in the news" and end up in the hospital because he had no medication. I specifically asked her if he made a threatening commments toward me re physical harm , and she stated no Additional Follow-up by: Syliva Overman MD,  March 12, 2010 12:06 PM

## 2010-03-20 NOTE — Progress Notes (Signed)
  Phone Note Call from Patient   Summary of Call: Patient was discharged from practice on 02/13/2010. He came into the office to pick up his Oxycodone rx which was only written for 3 days since we were only providing care for him for 30 days from recieving the discharge notice. He was very upset stating he has been unable to find a doctor because when he recieved his last medicaid card, they had [facility] listed as his provider and nobody else would take him because of that. He said that when he runs out of his meds  he is going to die because he doesnt have a doctor to refill them and nobody cares about him here. States we unfairly discharged him for no reason and it wasn't right and he has the conversations between him and Dr. Lodema Hong on tape and he is going to get a laywer. Office manager Doneen Poisson witnessed the conversation. Initial call taken by: Everitt Amber LPN,  March 12, 2010 9:08 AM

## 2010-03-20 NOTE — Medication Information (Signed)
Summary: Tax adviser   Imported By: Lind Guest 03/12/2010 10:53:22  _____________________________________________________________________  External Attachment:    Type:   Image     Comment:   External Document

## 2010-03-20 NOTE — Progress Notes (Signed)
  Phone Note Other Incoming   Caller: dr Arieal Cuoco Summary of Call: discussed with risk management , pt will be prescribed 2 weeks of all medications, for courtesy of all other meds, pls let him understand clearly that he has been discharged from the practice, I am no longer responsible for his care and he needs to see a new mD, pls also remind him that his medicaid card has him listed at a new facility and he should /could try to establishing  there. I will not be prescribing any meds for him after this.   (she also said that if he continues to call here and is threatening /perceived a s a nuisance, then security at [facility] should be contacted to give him a call)  Initial call taken by: Syliva Overman MD,  March 12, 2010 3:51 PM  Follow-up for Phone Call        Spoke with patient @ 4:20 PM and confirmed a two week supply of his medications for blood sugar, blood pressure, Ambien and Xanax had been submitted to [facility] in [place] and should be ready for his pick up late this afternoon or early tomorrow morning.  Confirmed that no other refills or scripts would be forthcoming from [facility] due to his discharge.

## 2010-03-20 NOTE — Miscellaneous (Signed)
Summary: [facility] REFILL  Clinical Lists Changes  Medications: Rx of OXYCODONE HCL 15 MG TABS (OXYCODONE HCL) Take 1 tablet by mouth four times a day;  #12 x 0;  Signed;  Entered by: Everitt Amber LPN;  Authorized by: Syliva Overman MD;  Method used: Handwritten    Prescriptions: OXYCODONE HCL 15 MG TABS (OXYCODONE HCL) Take 1 tablet by mouth four times a day  #12 x 0   Entered by:   Everitt Amber LPN   Authorized by:   Syliva Overman MD   Signed by:   Everitt Amber LPN on 81/19/1478   Method used:   Handwritten   RxID:   [id number]

## 2010-03-20 NOTE — Letter (Signed)
Summary: letter  letter   Imported By: Lind Guest 03/12/2010 17:23:58  _____________________________________________________________________  External Attachment:    Type:   Image     Comment:   External Document

## 2010-03-20 NOTE — Progress Notes (Signed)
  Phone Note Other Incoming   Caller: dr Chanie Soucek Summary of Call: pLEASE fill 2 weeks only, 14 tablets of all of Manuel Knight's medication, not the pain med which he already collected today, Admin will let him know the meds are being sent in for 2 weeks, you do not have to call him Initial call taken by: Syliva Overman MD,  March 12, 2010 3:53 PM  Follow-up for Phone Call        medications sent in for 2 weeks supply yesterday Follow-up by: Everitt Amber LPN,  March 13, 2010 8:48 AM

## 2010-03-20 NOTE — Letter (Signed)
Summary: [facility]  [facility]   Imported By: Lind Guest 03/14/2010 14:53:38  _____________________________________________________________________  External Attachment:    Type:   Image     Comment:   External Document

## 2010-03-25 ENCOUNTER — Ambulatory Visit: Payer: Self-pay | Admitting: Family Medicine

## 2010-03-28 ENCOUNTER — Telehealth: Payer: Self-pay | Admitting: Family Medicine

## 2010-04-10 NOTE — Progress Notes (Signed)
Summary: wants his records  Phone Note Call from Patient   Summary of Call: left message to call back at [phone] he wants his records fax to [phone]. Called and told him that he would have to come here and sign to a paper to release records or he could do it at the other office. And a company comes in on Friday and does the copying. Been in bed 3 days saw Dr. Adrian Prows yesterday. Doctor told him yesterday to go to ed for his blood pressure. He told Dr. Gerilyn Pilgrim that he smoke weed and that Dr. Lodema Hong discharged him and he dont know what happened.  Initial call taken by: Lind Guest,  March 28, 2010 1:16 PM  Follow-up for Phone Call        agree pt to sign for records to be sent, he may also sign at other doc's office, this is the norm,you should also verify by also getting a phone number  to verify an address where they are going to I belive, if he is not signing  from a facility. you may verify that this is correct with management Follow-up by: Syliva Overman MD,  March 28, 2010 1:55 PM

## 2010-04-16 LAB — GLUCOSE, CAPILLARY
Glucose-Capillary: 311 mg/dL — ABNORMAL HIGH (ref 70–99)
Glucose-Capillary: 315 mg/dL — ABNORMAL HIGH (ref 70–99)
Glucose-Capillary: 325 mg/dL — ABNORMAL HIGH (ref 70–99)

## 2010-04-16 LAB — DIFFERENTIAL
Basophils Absolute: 0.1 10*3/uL (ref 0.0–0.1)
Eosinophils Absolute: 0 10*3/uL (ref 0.0–0.7)
Eosinophils Absolute: 0.3 10*3/uL (ref 0.0–0.7)
Eosinophils Relative: 0 % (ref 0–5)
Eosinophils Relative: 2 % (ref 0–5)
Lymphocytes Relative: 17 % (ref 12–46)
Lymphocytes Relative: 25 % (ref 12–46)
Lymphs Abs: 2.9 10*3/uL (ref 0.7–4.0)
Monocytes Absolute: 1 10*3/uL (ref 0.1–1.0)
Neutrophils Relative %: 76 % (ref 43–77)

## 2010-04-16 LAB — URINALYSIS, ROUTINE W REFLEX MICROSCOPIC
Glucose, UA: 500 mg/dL — AB
Ketones, ur: NEGATIVE mg/dL
Leukocytes, UA: NEGATIVE
Nitrite: NEGATIVE
Protein, ur: 100 mg/dL — AB

## 2010-04-16 LAB — CBC
HCT: 38.8 % — ABNORMAL LOW (ref 39.0–52.0)
MCV: 90.9 fL (ref 78.0–100.0)
MCV: 90.9 fL (ref 78.0–100.0)
Platelets: 193 10*3/uL (ref 150–400)
Platelets: 225 10*3/uL (ref 150–400)
RBC: 4.94 MIL/uL (ref 4.22–5.81)
RDW: 13.3 % (ref 11.5–15.5)
WBC: 11.8 10*3/uL — ABNORMAL HIGH (ref 4.0–10.5)

## 2010-04-16 LAB — HEPATITIS PANEL, ACUTE
HCV Ab: NEGATIVE
Hep A IgM: NEGATIVE
Hep B C IgM: NEGATIVE
Hepatitis B Surface Ag: NEGATIVE

## 2010-04-16 LAB — POCT CARDIAC MARKERS
CKMB, poc: 1.8 ng/mL (ref 1.0–8.0)
Myoglobin, poc: 111 ng/mL (ref 12–200)
Troponin i, poc: <0.05 ng/mL (ref 0.00–0.09)

## 2010-04-16 LAB — BASIC METABOLIC PANEL
BUN: 10 mg/dL (ref 6–23)
Creatinine, Ser: 0.82 mg/dL (ref 0.4–1.5)
GFR calc non Af Amer: >60 mL/min (ref >60)
Glucose, Bld: 331 mg/dL — ABNORMAL HIGH (ref 70–99)
Potassium: 4.1 mEq/L (ref 3.5–5.1)

## 2010-04-16 LAB — COMPREHENSIVE METABOLIC PANEL
ALT: 85 U/L — ABNORMAL HIGH (ref 0–53)
AST: 42 U/L — ABNORMAL HIGH (ref 0–37)
Albumin: 3.7 g/dL (ref 3.5–5.2)
CO2: 28 mEq/L (ref 19–32)
Calcium: 9.6 mg/dL (ref 8.4–10.5)
Chloride: 97 mEq/L (ref 96–112)
Creatinine, Ser: 0.87 mg/dL (ref 0.4–1.5)
GFR calc Af Amer: >60 mL/min (ref >60)
GFR calc non Af Amer: >60 mL/min (ref >60)
Sodium: 134 mEq/L — ABNORMAL LOW (ref 135–145)
Total Bilirubin: 0.5 mg/dL (ref 0.3–1.2)

## 2010-04-16 LAB — CARDIAC PANEL(CRET KIN+CKTOT+MB+TROPI): Relative Index: INVALID (ref 0.0–2.5)

## 2010-04-16 LAB — LIPID PANEL
Cholesterol: 242 mg/dL — ABNORMAL HIGH (ref 0–200)
HDL: 24 mg/dL — ABNORMAL LOW (ref >39)
LDL Cholesterol: UNDETERMINED mg/dL (ref 0–99)
Total CHOL/HDL Ratio: 10.1 RATIO
Triglycerides: 535 mg/dL — ABNORMAL HIGH (ref <150)
VLDL: UNDETERMINED mg/dL (ref 0–40)

## 2010-04-16 LAB — LIPASE, BLOOD: Lipase: 35 U/L (ref 11–59)

## 2010-04-16 LAB — HEPATIC FUNCTION PANEL
Albumin: 3 g/dL — ABNORMAL LOW (ref 3.5–5.2)
Indirect Bilirubin: 0.4 mg/dL (ref 0.3–0.9)
Total Protein: 6 g/dL (ref 6.0–8.3)

## 2010-04-16 LAB — RAPID URINE DRUG SCREEN, HOSP PERFORMED: Barbiturates: NOT DETECTED

## 2010-04-16 LAB — URINE MICROSCOPIC-ADD ON

## 2010-04-16 LAB — HEMOGLOBIN A1C
Hgb A1c MFr Bld: 10.4 % — ABNORMAL HIGH (ref 4.6–6.1)
Mean Plasma Glucose: 252 mg/dL

## 2010-05-27 NOTE — Assessment & Plan Note (Signed)
Manuel Knight, Manuel Knight                CHART#:  [id number]   DATE:  11/24/2006                       DOB:  06/13/1960   CHIEF COMPLAINT:  Abdominal pain.   PHYSICIAN COSIGNING NOTE:  Dr. Roetta Sessions.   SUBJECTIVE:  Manuel Knight is here for a semi-urgent visit. He called this  morning and asked to be seen. He presented to the emergency department  on the 11th with complaints of left flank pain. He states that it  started several days prior. He recalls, last Thursday or Friday, that he  was loading the car up with large packages of toilet tissue. He states  that it was not heavy, but when he turned, he felt like he had pulled a  muscle in his back. He had some pain off and on for a couple of days and  he really could not move that well over the weekend. The pain  progressively worsened and it would cause him to fall to his knees  because it was so severe. He was also having vomiting related to it. He  denies any symptoms such as abdominal pain, dysuria, hematuria,  constipation, or diarrhea. He states that he had a similar type of pain  before when he had a kidney stone. He thought he might have had a kidney  stone, therefore he went to the emergency department on the 11th. He  says that he cannot lay on that side. When he coughs or takes a really  deep breath, it really hurts. He denies any fever or chills. He has had  some bright red blood per rectum in the remote past, and we have  actually been trying to get him scheduled for a colonoscopy, but he  keeps declining. On the fifth of this month, he actually said that he  was not ready to go through with it as well.   His workup in the emergency department included normal LFTs, MET-7 was  normal except for a glucose of 114. His white count was slightly  elevated at 11900, but otherwise CBC was normal. His urine drug screen  was positive for benzodiazepines and marijuana. Urinalysis was entirely  negative. 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 No hydronephrosis or urethral calculus. He had fatty  liver and an unchanged left lower lobe pulmonary nodule measuring 6  millimeters.   Back in August 2008, the patient had some right mild hydronephrosis and  ureterectasis. In July 2008, he had a contrast CT which showed a  descending duodenal diverticulum?, scattered sigmoid colon diverticula,  terminal ileum appeared normal, unchanged small nodule in the left lower  lobe. The patient has not had a dedicated chest CT.   CURRENT MEDICATIONS:  See the updated list.   ALLERGIES:  LODINE and DARVOCET.   PHYSICAL EXAMINATION:  VITAL SIGNS:  Weight 251 pounds, temperature  97.9, blood pressure 190/110 and on repeat it was 180/100, pulse 104.  GENERAL:  Pleasant, uncomfortable appearing Caucasian male in no acute  distress. He is having difficulty moving around in the office including  standing up or laying down on the bed. He feels that his blood pressure  is up because of his pain.  SKIN:  Warm and  dry. No jaundice.  HEENT:  Sclerae nonicteric, oropharyngeal mucosa moist and pink.  LUNGS:  Clear to auscultation.  CARDIAC:  Regular rhythm with a rate of 104.  ABDOMEN:  Positive bowel sounds, obese but symmetrical, soft. Nontender,  nondistended, no organomegaly or masses.  LOWER EXTREMITIES:  No edema.  BACK:  Significant tenderness with palpation of musculature radiating  around to the thoracic region. No tenderness with palpation over the  spine itself. Even light palpation causes significant pain.   IMPRESSION:  The patient is a 50 year old gentleman who presents with  severe left flank pain which in actuality, on exam, is left back pain in  the thoracic region. I believe that the pain is musculoskeletal based on  exam. He had no evidence to indicate obstructing renal stone on recent  evaluation. Nothing on  CT to explain to his pain. His urine was  unremarkable. He does not have any abdominal pain whatsoever. He does  have remote hematochezia, and again, we have recommended that at some  point in the near future that he should undergo a colonoscopy, but he  continues to refuse it at this point.   PLAN:  1. Flexeril 10 mg t.i.d. as needed, number of 10.  2. Percocet, number of 20, 1 to 2 p.o. every 6 hours as needed for      pain, 0 refills. He is not to use this in conjunction with the      Vicodin that her received yesterday at the ER.  3. We made him an appointment for him to see Dr. Lodema Hong ASAP.  4. Encouraged colonoscopy whenever he is ready.  5. The patient needs to consider a dedicated chest CT given his      history of lower lung nodule and to further evaluate his remaining      lung given that he is chronic smoker. I will leave this up to Dr.      Anthony Sar discretion.       Tana Coast, P.A.  Electronically Signed     R. Roetta Sessions, M.D.  Electronically Signed    LL/MEDQ  D:  11/25/2006  T:  11/26/2006  Job:  [id number]   cc:   Milus Mallick. Lodema Hong, M.D.

## 2010-05-27 NOTE — Consult Note (Signed)
NAME:  TABB, CROGHAN             ACCOUNT NO.:  [id number]   MEDICAL RECORD NO.:  [id number]          PATIENT TYPE:  AMB   LOCATION:  DAY                           FACILITY:  [facility]   PHYSICIAN:  Kassie Mends, M.D.      DATE OF BIRTH:  09-Aug-1960   DATE OF CONSULTATION:  DATE OF DISCHARGE:                                 CONSULTATION   REASON FOR CONSULTATION:  Recurrent abdominal pain/history of  uncomplicated diverticulitis.   HISTORY OF PRESENT ILLNESS:  Mr. Arvelo is a 50 year old male who was  diagnosed with complicated diverticulitis while hospitalized back in  February.  On March 12, 2006, he was found to have diverticulitis  with an area of performation in the sigmoid colon.  He tells me he was  treated with antibiotics and sent home.  He tells me he never got  better.  He was seen by Dr. Lodema Hong in the interim.  They had called our  office to set up an appointment.  He had an appointment scheduled on Jun 10, 2006.  He did not show up for the appointment.  On May 26, 2006, he  had another CT scan of the abdomen and pelvis with contrast at [facility] and was found to have sigmoid diverticulosis.  No definite  pericolic inflammatory changes or colonic wall thickening to suggested  acute diverticulitis.  Possible mild thickening of the terminal ileum to  versus under distention.  He tells me he has not felt well since his  episode of appendicitis.  He has had generalized abdominal pain.  He  tells me his pain has worsened since January of this year, so  approximately 6 months ago.  He tells me sitting makes the pain worse.  He describes his abdomen as in constant knots.  The pain ranges  anywhere from 8-10/10 on pain scale.  He is not taking any pain  medications currently.  He does have some nausea.  He is vomiting 3-4  times a week.  He is having loose bowel movements that are explosive,  but only once or twice a day.  Denies any fever.  Has had some chills.  Denies any NSAID or aspirin use.  His pain is usually worse post  prandial.   PAST MEDICAL/SURGICAL HISTORY:  1. Diabetes mellitus.  2. Hypertension.  3. Chronic GERD.  4. Claustrophobia.  5. Psychiatric illness including anxiety.  6. Status post appendectomy.   CURRENT MEDICATIONS:  1. Amlodipine 10 mg daily.  2. Benicar HCTZ 40/25 mg daily.  3. Alprazolam 1 mg q.i.d.  4. Metformin 500 mg t.i.d.  5. Nexium 40 daily.  6. Tussionex cough syrup q.h.s.  7. Advair unknown dose daily.  8. Albuterol p.r.n.   ALLERGIES:  LODINE AND DARVOCET.   FAMILY HISTORY:  No known family history of colorectal carcinoma or  liver or chronic GI problems.   SOCIAL HISTORY:  Mr. Jarmon lives with his significant other of 10  years.  He is disabled.  He has a 20-pack year history of tobacco use  including 10 years ago.  He occasionally smokes  marijuana.  Denies any  alcohol use.   REVIEW OF SYSTEMS:  See HPI.  GI:  He has history of chronic GERD, well  controlled on Nexium.  He has never had a colonoscopy or EGD.  Otherwise, negative review of systems.   PHYSICAL EXAMINATION:  VITAL SIGNS:  Weight 259, height 69 inches,  temperature 98.0, blood pressure 160/110, pulse 80.  GENERAL:  Mr. Traven is an obese Caucasian male who is alert, oriented,  quite anxious, in no acute distress.  HEENT:  Sclerae are clear, nonicteric.  Conjunctivae are pink.  Oropharynx pink and moist without any lesions.  NECK:  Supple without any mass or thyromegaly.  CHEST:  Heart regular rate and rhythm with normal S1, S2 without any  murmurs, clicks, rubs or gallops.  LUNGS:  Clear to auscultation bilaterally.  ABDOMEN:  Positive bowel sounds x4, slightly distended.  He has  significant abdominal tenderness around the umbilicus on even the  mildest palpation.  He jumped up from the bed and began screaming.  He  would not leg me further complete his abdominal exam.  He also asked me  to leave the exam room door open  as he is claustrophobic.  EXTREMITIES:  Without clubbing or edema bilaterally.   LABORATORY DATA:  From July 01, 2006, WBC 10.3, hemoglobin 15.8,  hematocrit 44.8, platelets 277.  He had a CMP on July 01, 2006, which  was normal except for a glucose of 112 and normal lipase.  Urinalysis  was positive for high specific gravity and trace blood.   ASSESSMENT:  Mr. Commons is a 50 year old male with history of severe  abdominal pain.  In March and April he was treated with antibiotics for  complicated diverticulitis with abscess.  He continues to have abdominal  pain.  Most recent CT was done in May and showed resolution of  diverticulitis.  However, on exam today, he is quite tender.  I was  unable to complete the exam given his severe pain.  I suspect he can  have recurrent diverticulitis. I also would be concerned about  colorectal neoplasia in this gentleman who has not had a colonoscopy.   He does have significant amount of anxiety as well as claustrophobia and  is disabled due to his psychiatric illness.   PLAN:  1. CT of abdomen and pelvis with IV and oral contrast as soon as      possible.  2. He did have a normal creatinine previously.  He is diabetic and      will have to use Metformin protocol.  3. We will be contacting with CT results and go from there.  4. OPV with Dr. Cira Servant to discuss need for surgery.   I would like to thank Dr. Lodema Hong for allowing [place] to participate in the  care of Mr Hocker.      Lorenza Burton, N.P.      Kassie Mends, M.D.  Electronically Signed    KJ/MEDQ  D:  08/02/2006  T:  08/02/2006  Job:  [id number]   cc:   Milus Mallick. Lodema Hong, M.D.  Fax: [phone]

## 2010-06-17 ENCOUNTER — Emergency Department (HOSPITAL_COMMUNITY): Payer: Medicare Other

## 2010-06-17 ENCOUNTER — Emergency Department (HOSPITAL_COMMUNITY)
Admission: EM | Admit: 2010-06-17 | Discharge: 2010-06-17 | Disposition: A | Discharge status: Home / Self Care | Payer: Medicare Other | Attending: Emergency Medicine | Admitting: Emergency Medicine

## 2010-06-17 DIAGNOSIS — I10 Essential (primary) hypertension: Secondary | ICD-10-CM | POA: Insufficient documentation

## 2010-06-17 DIAGNOSIS — E7800 Pure hypercholesterolemia, unspecified: Secondary | ICD-10-CM | POA: Insufficient documentation

## 2010-06-17 DIAGNOSIS — Z79899 Other long term (current) drug therapy: Secondary | ICD-10-CM | POA: Insufficient documentation

## 2010-06-17 DIAGNOSIS — E119 Type 2 diabetes mellitus without complications: Secondary | ICD-10-CM | POA: Insufficient documentation

## 2010-06-17 DIAGNOSIS — M5126 Other intervertebral disc displacement, lumbar region: Secondary | ICD-10-CM | POA: Insufficient documentation

## 2010-10-21 LAB — DIFFERENTIAL
Eosinophils Absolute: 0.2
Eosinophils Relative: 2
Lymphs Abs: 3.5
Monocytes Absolute: 0.9
Monocytes Relative: 8

## 2010-10-21 LAB — COMPREHENSIVE METABOLIC PANEL
ALT: 30
AST: 23
CO2: 27
Calcium: 8.8
GFR calc Af Amer: >60
Potassium: 3.8
Sodium: 139
Total Protein: 6.9

## 2010-10-21 LAB — CBC
Hemoglobin: 15.4
RBC: 5.1
RDW: 13.3
WBC: 11.9 — ABNORMAL HIGH

## 2010-10-21 LAB — URINALYSIS, ROUTINE W REFLEX MICROSCOPIC
Nitrite: NEGATIVE
Specific Gravity, Urine: 1.015
pH: 6.5

## 2010-10-21 LAB — RAPID URINE DRUG SCREEN, HOSP PERFORMED: Tetrahydrocannabinol: POSITIVE — AB

## 2010-10-27 LAB — URINALYSIS, ROUTINE W REFLEX MICROSCOPIC
Bilirubin Urine: NEGATIVE
Glucose, UA: NEGATIVE
Ketones, ur: NEGATIVE
Protein, ur: NEGATIVE

## 2010-10-27 LAB — COMPREHENSIVE METABOLIC PANEL
Alkaline Phosphatase: 73
BUN: 15
Glucose, Bld: 171 — ABNORMAL HIGH
Potassium: 3.9
Total Protein: 7.4

## 2010-10-27 LAB — URINE MICROSCOPIC-ADD ON

## 2010-10-27 LAB — CBC
HCT: 48
Hemoglobin: 16.4
MCHC: 34.2
RDW: 14.2 — ABNORMAL HIGH

## 2010-10-27 LAB — DIFFERENTIAL
Basophils Absolute: 0.1
Basophils Relative: 1
Monocytes Relative: 9
Neutro Abs: 7.1
Neutrophils Relative %: 59

## 2010-10-27 LAB — SAMPLE TO BLOOD BANK

## 2010-10-29 LAB — CBC
HCT: 44.8
MCHC: 35.2
MCV: 87.6
Platelets: 277
RDW: 14.8 — ABNORMAL HIGH

## 2010-10-29 LAB — COMPREHENSIVE METABOLIC PANEL
Albumin: 3.7
BUN: 12
Creatinine, Ser: 0.81
Total Bilirubin: 0.6
Total Protein: 6.7

## 2010-10-29 LAB — DIFFERENTIAL
Basophils Absolute: 0.2 — ABNORMAL HIGH
Lymphocytes Relative: 31
Monocytes Absolute: 0.9 — ABNORMAL HIGH
Monocytes Relative: 8
Neutro Abs: 5.8
Neutrophils Relative %: 56

## 2010-10-29 LAB — URINALYSIS, ROUTINE W REFLEX MICROSCOPIC
Ketones, ur: NEGATIVE
Leukocytes, UA: NEGATIVE
Nitrite: NEGATIVE
Protein, ur: NEGATIVE
Urobilinogen, UA: 0.2
pH: 5.5

## 2010-12-11 ENCOUNTER — Ambulatory Visit: Payer: Medicare Other | Admitting: Gastroenterology

## 2010-12-15 ENCOUNTER — Encounter: Payer: Self-pay | Admitting: Gastroenterology

## 2010-12-15 ENCOUNTER — Ambulatory Visit (INDEPENDENT_AMBULATORY_CARE_PROVIDER_SITE_OTHER): Payer: Medicare Other | Admitting: Gastroenterology

## 2010-12-15 VITALS — BP 195/120 | HR 100 | Temp 97.7°F | Ht 69.0 in | Wt 264.8 lb

## 2010-12-15 DIAGNOSIS — R1319 Other dysphagia: Secondary | ICD-10-CM

## 2010-12-15 DIAGNOSIS — K219 Gastro-esophageal reflux disease without esophagitis: Secondary | ICD-10-CM

## 2010-12-15 DIAGNOSIS — R197 Diarrhea, unspecified: Secondary | ICD-10-CM

## 2010-12-15 DIAGNOSIS — R1314 Dysphagia, pharyngoesophageal phase: Secondary | ICD-10-CM

## 2010-12-15 DIAGNOSIS — K529 Noninfective gastroenteritis and colitis, unspecified: Secondary | ICD-10-CM

## 2010-12-15 DIAGNOSIS — R1310 Dysphagia, unspecified: Secondary | ICD-10-CM

## 2010-12-15 DIAGNOSIS — R109 Unspecified abdominal pain: Secondary | ICD-10-CM

## 2010-12-15 NOTE — Assessment & Plan Note (Signed)
Chronic diarrhea for at least 5 years. Check stool studies, TTG, TSH. Colonoscopy with terminal ileoscopy with Dr. Darrick Penna in the near future. Procedure to be done with deep sedation in the OR given his chronic narcotic use, polypharmacy.  I have discussed the risks, alternatives, benefits with regards to but not limited to the risk of reaction to medication, bleeding, infection, perforation and the patient is agreeable to proceed. Written consent to be obtained.

## 2010-12-15 NOTE — Assessment & Plan Note (Signed)
Refractory GERD symptoms. On PPI for more than 5 years. Recommend EGD for further evaluation. Procedure to be done with deep sedation given polypharmacy and chronic narctoic use.   I have discussed the risks, alternatives, benefits with regards to but not limited to the risk of reaction to medication, bleeding, infection, perforation and the patient is agreeable to proceed. Written consent to be obtained.

## 2010-12-15 NOTE — Assessment & Plan Note (Signed)
Solid food esophageal dysphagia. Consider dilation at time of EGD.  I have discussed the risks, alternatives, benefits with regards to but not limited to the risk of reaction to medication, bleeding, infection, perforation and the patient is agreeable to proceed. Written consent to be obtained.

## 2010-12-15 NOTE — Progress Notes (Signed)
Cc to PCP 

## 2010-12-15 NOTE — Assessment & Plan Note (Signed)
Diffuse chronic abdominal pain. Patient mentions that he had "abdominal mass" when he had his appendix removed. According to the op note, he had significant adhesions of the omentum with the omentum stuck down in the pelvis and in the right side of the abdomen requiring conversion from a laparoscopic appendectomy to open procedure. Subsequent CT scans have failed to showed any "abdominal mass". Evaluate abdominal pain at time of EGD/colonoscopy. Obtain CBC, CMET, lipase.

## 2010-12-15 NOTE — Patient Instructions (Signed)
Please have blood work done and stool test done as soon as possible. We will need these back prior to your procedures. We have scheduled you for a colonoscopy and upper endoscopy. Please see separate instructions. I recommend you go home and take your blood pressure pill. If it does not return to safe level as discussed, you need to call your PCP or go to ED.

## 2010-12-15 NOTE — Progress Notes (Signed)
Anticipate that TCS will be challenging due to prior surgical Hx, complicated diverticulitis, & morbid obesity.  TCS/EGD/?dil in or

## 2010-12-15 NOTE — Progress Notes (Signed)
Primary Care Physician:  Reynolds Bowl, MD  Primary Gastroenterologist:  Jonette Eva, MD   Chief Complaint  Patient presents with  . Colonoscopy  . Abdominal Pain  . Diarrhea  . Emesis    HPI:  Manuel Knight is a 50 y.o. male here with referral for colonoscopy from Dr. Reynolds Bowl however when we called to triage patient for procedure he had numerous GI complaints. He was therefore advised to come in for office visit.  He c/o refractory heartburn symptoms on pantoprazole. Bitter taste in mouth. No appetite unless smokes weed. Constant abdominal pain. Pain moves from LLQ to RLQ to epigastrium. Not able to describe pain. Says he cannot get out of bed unless he smokes weed. Pain not always worse with meals but he states he only eats soups or very small amounts because if he eats more then the pain may be worse and he has terrible diarrhea. Diarrhea with sodas and juice. BM 7-8 per day. Wears depends a lot, especially at night. Lots of fecal urgency. No solid stool in years. None since appendectomy in 2007. Talked to lawyers in 2007 about suing for being "sent home too early from the hospital" and they wouldn't listen. C/O urinary urgency. Two tickets for indescent exposure for pulling over on the side of the road to urinate. No blood in stool that knows of. Feels like food hanging up and having wash down. Some intermittent vomiting.   Raising 24 year old grandson.   Doesn't know why Dr. Lodema Hong discharged him from her practice earlier this year. Per notes in centricity, he broke contract for chronic pain management by having positive drug screen for cannabinoids.   Current Outpatient Prescriptions  Medication Sig Dispense Refill  . albuterol (PROVENTIL HFA;VENTOLIN HFA) 108 (90 BASE) MCG/ACT inhaler Inhale 2 puffs into the lungs every 6 (six) hours as needed.        . ALPRAZolam (XANAX) 1 MG tablet Take 1 mg by mouth 4 (four) times daily.        . benazepril-hydrochlorthiazide (LOTENSIN  HCT) 20-12.5 MG per tablet Take 2 tablets by mouth daily.        . cloNIDine (CATAPRES) 0.3 MG tablet Take 0.3 mg by mouth as directed. Take 1 tablet by mouth a day two times a day, take twelve hours apart on a regular schedule (#60)       . diltiazem (CARDIZEM SR) 120 MG 12 hr capsule Take 120 mg by mouth 2 (two) times daily.        Marland Kitchen doxazosin (CARDURA) 8 MG tablet Take 8 mg by mouth at bedtime.        . Fluticasone-Salmeterol (ADVAIR DISKUS) 500-50 MCG/DOSE AEPB Inhale 1 puff into the lungs every 12 (twelve) hours.        Marland Kitchen glipiZIDE (GLUCOTROL) 10 MG tablet Take 10 mg by mouth 2 (two) times daily before a meal.        . metFORMIN (GLUCOPHAGE) 1000 MG tablet Take 1,000 mg by mouth 2 (two) times daily with a meal.        . oxyCODONE (ROXICODONE) 15 MG immediate release tablet Take 15 mg by mouth 4 (four) times daily.        . pantoprazole (PROTONIX) 40 MG tablet Take 40 mg by mouth daily.        . rosuvastatin (CRESTOR) 40 MG tablet Take 40 mg by mouth at bedtime.        . sitaGLIPtan (JANUVIA) 100 MG tablet Take 100 mg by mouth  daily.        . tiotropium (SPIRIVA) 18 MCG inhalation capsule Place 18 mcg into inhaler and inhale daily.        Marland Kitchen zolpidem (AMBIEN) 10 MG tablet Take 10 mg by mouth at bedtime as needed.          Allergies as of 12/15/2010 - Review Complete 12/15/2010  Allergen Reaction Noted  . Propoxyphene n-acetaminophen      Past Medical History  Diagnosis Date  . Anxiety   . Pulmonary nodule   . Hyperlipidemia   . COPD (chronic obstructive pulmonary disease)   . Chronic back pain   . Obesity   . Diabetes mellitus, type 2   . Hypertension   . Cellulitis 2011    of neck   . GERD (gastroesophageal reflux disease)   . Claustrophobia   . Diverticulitis of colon 2008    complicated by abscess  . Heart attack     Past Surgical History  Procedure Date  . Appendectomy 2007    ganrenous appendicitis   . Tonsillectomy     Family History  Problem Relation Age of  Onset  . Heart attack Father   . Diabetes Sister   . Diabetes Sister   . Heart attack Brother   . Colon cancer Neg Hx   . Cancer Mother     "blood transplant"    History   Social History  . Marital Status: Single    Spouse Name: N/A    Number of Children: 2tw  . Years of Education: N/A   Occupational History  . Not on file.   Social History Main Topics  . Smoking status: Former Smoker -- 1.5 packs/day    Types: Cigarettes  . Smokeless tobacco: Not on file   Comment: quit about 25+ yrs  . Alcohol Use: Yes     a couple beer on the weekend  . Drug Use: Yes    Special: Marijuana     current use of marijuana use /for the pain  . Sexually Active: Not on file   Other Topics Concern  . Not on file   Social History Narrative  . No narrative on file      ROS:  General: Negative for weight loss, fever, chills, fatigue, weakness. Eyes: Negative for vision changes.  ENT: Negative for hoarseness, nasal congestion. See HPI. CV: Negative for chest pain, angina, palpitations, dyspnea on exertion, peripheral edema.  Respiratory: Negative for dyspnea at rest, dyspnea on exertion, cough, sputum, wheezing.  GI: See history of present illness. GU:  Negative for dysuria, hematuria, urinary incontinence. See HPI.  MS: Negative for joint pain. Chronic back and neck pain.  Derm: Negative for rash or itching.  Neuro: Negative for weakness, abnormal sensation, seizure, frequent headaches, memory loss, confusion.  Psych: Negative for depression, suicidal ideation, hallucinations. Positive for anxiety and claustrophobia. Endo: Negative for unusual weight change.  Heme: Negative for bruising or bleeding. Allergy: Negative for rash or hives.    Physical Examination:  BP 195/120  Pulse 100  Temp(Src) 97.7 F (36.5 C) (Temporal)  Ht 5\' 9"  (1.753 m)  Wt 264 lb 12.8 oz (120.112 kg)  BMI 39.10 kg/m2   General: Well-nourished, well-developed in no acute distress. Very anxious. Left the  office to go outside several times.  Head: Normocephalic, atraumatic.   Eyes: Conjunctiva pink, no icterus. Mouth: Oropharyngeal mucosa moist and pink , no lesions erythema or exudate. Neck: Supple without thyromegaly, masses, or lymphadenopathy.  Lungs: Clear to  auscultation bilaterally.  Heart: Regular rate and rhythm, no murmurs rubs or gallops.  Abdomen: Bowel sounds are normal. Mild tenderness even with placing stethoscope on abdomen. Diffuse tenderness. Obese, soft. No hepatosplenomegaly or masses, no abdominal bruits or    hernia , no rebound or guarding.   Rectal: Defer to time of colonoscopy. Extremities: No lower extremity edema. No clubbing or deformities.  Neuro: Alert and oriented x 4 , grossly normal neurologically.  Skin: Warm and dry, no rash or jaundice.   Psych: Alert and cooperative, normal mood and affect. Anxious.

## 2010-12-16 ENCOUNTER — Encounter: Payer: Self-pay | Admitting: Gastroenterology

## 2010-12-16 ENCOUNTER — Telehealth: Payer: Self-pay | Admitting: Gastroenterology

## 2010-12-16 NOTE — Telephone Encounter (Signed)
New letter mailed with Pre Op date/time

## 2010-12-31 ENCOUNTER — Telehealth: Payer: Self-pay

## 2010-12-31 NOTE — Telephone Encounter (Signed)
Called pt to see when he is going to do stool tests. No VM.

## 2011-01-01 NOTE — Progress Notes (Signed)
Called and spoke with pt's significant other. Asked her to please have pt complete labs and stool tests. She said she had just told him recently that he needed to do this so his assessment could be completed.

## 2011-01-02 ENCOUNTER — Encounter (HOSPITAL_COMMUNITY): Payer: Self-pay

## 2011-01-07 ENCOUNTER — Encounter (HOSPITAL_COMMUNITY)
Admission: RE | Admit: 2011-01-07 | Discharge: 2011-01-07 | Payer: Medicare Other | Source: Ambulatory Visit | Attending: Family Medicine | Admitting: Family Medicine

## 2011-01-07 NOTE — Telephone Encounter (Signed)
See documentation on 01/01/2011. Pt's significant other was informed to remind pt to do stool tests.

## 2011-01-07 NOTE — Patient Instructions (Addendum)
Manuel Knight  01/07/2011   Your procedure is scheduled on:   01/12/2011  Report to [facility] at  630  AM.  Call this number if you have problems the morning of surgery: [phone]   Remember:   Do not eat food:After Midnight.  May have clear liquids:until Midnight .  Clear liquids include soda, tea, black coffee, apple or grape juice, broth.  Take these medicines the morning of surgery with A SIP OF WATER: xanax,benazapril,hctz,clonidine,diltiazem,doxazosin,oxycodone,protonix. Take spiriva, advair and albuterol before you come.   Do not wear jewelry, make-up or nail polish.  Do not wear lotions, powders, or perfumes. You may wear deodorant.  Do not shave 48 hours prior to surgery.  Do not bring valuables to the hospital.  Contacts, dentures or bridgework may not be worn into surgery.  Leave suitcase in the car. After surgery it may be brought to your room.  For patients admitted to the hospital, checkout time is 11:00 AM the day of discharge.   Patients discharged the day of surgery will not be allowed to drive home.  Name and phone number of your driver: family  Special Instructions: N/A   Please read over the following fact sheets that you were given: Pain Booklet, Surgical Site Infection Prevention, Anesthesia Post-op Instructions and Care and Recovery After Surgery Esophagogastroduodenoscopy This is an endoscopic procedure (a procedure that uses a device like a flexible telescope) that allows your caregiver to view the upper stomach and small bowel. This test allows your caregiver to look at the esophagus. The esophagus carries food from your mouth to your stomach. They can also look at your duodenum. This is the first part of the small intestine that attaches to the stomach. This test is used to detect problems in the bowel such as ulcers and inflammation. PREPARATION FOR TEST Nothing to eat after midnight the day before the test. NORMAL FINDINGS Normal esophagus, stomach,  and duodenum. Ranges for normal findings may vary among different laboratories and hospitals. You should always check with your doctor after having lab work or other tests done to discuss the meaning of your test results and whether your values are considered within normal limits. MEANING OF TEST  Your caregiver will go over the test results with you and discuss the importance and meaning of your results, as well as treatment options and the need for additional tests if necessary. OBTAINING THE TEST RESULTS It is your responsibility to obtain your test results. Ask the lab or department performing the test when and how you will get your results. Document Released: 04/Manuel/2006 Document Revised: 09/10/2010 Document Reviewed: 12/09/2007 [facility] Patient Information 2012 [place], [place].Colonoscopy A colonoscopy is an exam to evaluate your entire colon. In this exam, your colon is cleansed. A long fiberoptic tube is inserted through your rectum and into your colon. The fiberoptic scope (endoscope) is a long bundle of enclosed and very flexible fibers. These fibers transmit light to the area examined and send images from that area to your caregiver. Discomfort is usually minimal. You may be given a drug to help you sleep (sedative) during or prior to the procedure. This exam helps to detect lumps (tumors), polyps, inflammation, and areas of bleeding. Your caregiver may also take a small piece of tissue (biopsy) that will be examined under a microscope. LET YOUR CAREGIVER KNOW ABOUT:   Allergies to food or medicine.   Medicines taken, including vitamins, herbs, eyedrops, over-the-counter medicines, and creams.   Use of steroids (by mouth or  creams).   Previous problems with anesthetics or numbing medicines.   History of bleeding problems or blood clots.   Previous surgery.   Other health problems, including diabetes and kidney problems.   Possibility of pregnancy, if this applies.  BEFORE THE  PROCEDURE   A clear liquid diet may be required for 2 days before the exam.   Ask your caregiver about changing or stopping your regular medications.   Liquid injections (enemas) or laxatives may be required.   A large amount of electrolyte solution may be given to you to drink over a short period of time. This solution is used to clean out your colon.   You should be present 60 minutes prior to your procedure or as directed by your caregiver.  AFTER THE PROCEDURE   If you received a sedative or pain relieving medication, you will need to arrange for someone to drive you home.   Occasionally, there is a little blood passed with the first bowel movement. Do not be concerned.  FINDING OUT THE RESULTS OF YOUR TEST Not all test results are available during your visit. If your test results are not back during the visit, make an appointment with your caregiver to find out the results. Do not assume everything is normal if you have not heard from your caregiver or the medical facility. It is important for you to follow up on all of your test results. HOME CARE INSTRUCTIONS   It is not unusual to pass moderate amounts of gas and experience mild abdominal cramping following the procedure. This is due to air being used to inflate your colon during the exam. Walking or a warm pack on your belly (abdomen) may help.   You may resume all normal meals and activities after sedatives and medicines have worn off.   Only take over-the-counter or prescription medicines for pain, discomfort, or fever as directed by your caregiver. Do not use aspirin or blood thinners if a biopsy was taken. Consult your caregiver for medicine usage if biopsies were taken.  SEEK IMMEDIATE MEDICAL CARE IF:   You have a fever.   You pass large blood clots or fill a toilet with blood following the procedure. This may also occur 10 to 14 days following the procedure. This is more likely if a biopsy was taken.   You develop  abdominal pain that keeps getting worse and cannot be relieved with medicine.  Document Released: 12/27/1999 Document Revised: 09/10/2010 Document Reviewed: 08/11/2007 [facility] Patient Information 2012 [place], [place].PATIENT INSTRUCTIONS POST-ANESTHESIA  IMMEDIATELY FOLLOWING SURGERY:  Do not drive or operate machinery for the first twenty four hours after surgery.  Do not make any important decisions for twenty four hours after surgery or while taking narcotic pain medications or sedatives.  If you develop intractable nausea and vomiting or a severe headache please notify your doctor immediately.  FOLLOW-UP:  Please make an appointment with your surgeon as instructed. You do not need to follow up with anesthesia unless specifically instructed to do so.  WOUND CARE INSTRUCTIONS (if applicable):  Keep a dry clean dressing on the anesthesia/puncture wound site if there is drainage.  Once the wound has quit draining you may leave it open to air.  Generally you should leave the bandage intact for twenty four hours unless there is drainage.  If the epidural site drains for more than 36-48 hours please call the anesthesia department.  QUESTIONS?:  Please feel free to call your physician or the hospital operator if you  have any questions, and they will be happy to assist you.     [facility] Anesthesia Department [address] [place] [phone]

## 2011-01-09 ENCOUNTER — Encounter (HOSPITAL_COMMUNITY): Admission: RE | Admit: 2011-01-09 | Discharge: 2011-01-09 | Payer: Medicare Other | Source: Ambulatory Visit

## 2011-01-09 NOTE — Patient Instructions (Addendum)
20 Kaci Freel  01/09/2011   Your procedure is scheduled on:  01/12/2011  Report to [facility] at  630  AM.  Call this number if you have problems the morning of surgery: [phone]   Remember:   Do not eat food:After Midnight.  May have clear liquids:until Midnight .  Clear liquids include soda, tea, black coffee, apple or grape juice, broth.  Take these medicines the morning of surgery with A SIP OF WATER:  Xanax,benazepril,hctz,clonidine,diltiazem,doxazosine,oxycodone,protonix. Take your spiriva,albuterol and advair before you come.   Do not wear jewelry, make-up or nail polish.  Do not wear lotions, powders, or perfumes. You may wear deodorant.  Do not shave 48 hours prior to surgery.  Do not bring valuables to the hospital.  Contacts, dentures or bridgework may not be worn into surgery.  Leave suitcase in the car. After surgery it may be brought to your room.  For patients admitted to the hospital, checkout time is 11:00 AM the day of discharge.   Patients discharged the day of surgery will not be allowed to drive home.  Name and phone number of your driver: family  Special Instructions: N/A   Please read over the following fact sheets that you were given: Pain Booklet, Surgical Site Infection Prevention, Anesthesia Post-op Instructions and Care and Recovery After Surgery Esophagogastroduodenoscopy This is an endoscopic procedure (a procedure that uses a device like a flexible telescope) that allows your caregiver to view the upper stomach and small bowel. This test allows your caregiver to look at the esophagus. The esophagus carries food from your mouth to your stomach. They can also look at your duodenum. This is the first part of the small intestine that attaches to the stomach. This test is used to detect problems in the bowel such as ulcers and inflammation. PREPARATION FOR TEST Nothing to eat after midnight the day before the test. NORMAL FINDINGS Normal esophagus,  stomach, and duodenum. Ranges for normal findings may vary among different laboratories and hospitals. You should always check with your doctor after having lab work or other tests done to discuss the meaning of your test results and whether your values are considered within normal limits. MEANING OF TEST  Your caregiver will go over the test results with you and discuss the importance and meaning of your results, as well as treatment options and the need for additional tests if necessary. OBTAINING THE TEST RESULTS It is your responsibility to obtain your test results. Ask the lab or department performing the test when and how you will get your results. Document Released: 05/01/2004 Document Revised: 09/10/2010 Document Reviewed: 12/09/2007 [facility] [place] Patient Information 2012 [place], [place].Colonoscopy A colonoscopy is an exam to evaluate your entire colon. In this exam, your colon is cleansed. A long fiberoptic tube is inserted through your rectum and into your colon. The fiberoptic scope (endoscope) is a long bundle of enclosed and very flexible fibers. These fibers transmit light to the area examined and send images from that area to your caregiver. Discomfort is usually minimal. You may be given a drug to help you sleep (sedative) during or prior to the procedure. This exam helps to detect lumps (tumors), polyps, inflammation, and areas of bleeding. Your caregiver may also take a small piece of tissue (biopsy) that will be examined under a microscope. LET YOUR CAREGIVER KNOW ABOUT:   Allergies to food or medicine.   Medicines taken, including vitamins, herbs, eyedrops, over-the-counter medicines, and creams.   Use of steroids (by mouth or  creams).   Previous problems with anesthetics or numbing medicines.   History of bleeding problems or blood clots.   Previous surgery.   Other health problems, including diabetes and kidney problems.   Possibility of pregnancy, if this applies.  BEFORE  THE PROCEDURE   A clear liquid diet may be required for 2 days before the exam.   Ask your caregiver about changing or stopping your regular medications.   Liquid injections (enemas) or laxatives may be required.   A large amount of electrolyte solution may be given to you to drink over a short period of time. This solution is used to clean out your colon.   You should be present 60 minutes prior to your procedure or as directed by your caregiver.  AFTER THE PROCEDURE   If you received a sedative or pain relieving medication, you will need to arrange for someone to drive you home.   Occasionally, there is a little blood passed with the first bowel movement. Do not be concerned.  FINDING OUT THE RESULTS OF YOUR TEST Not all test results are available during your visit. If your test results are not back during the visit, make an appointment with your caregiver to find out the results. Do not assume everything is normal if you have not heard from your caregiver or the medical facility. It is important for you to follow up on all of your test results. HOME CARE INSTRUCTIONS   It is not unusual to pass moderate amounts of gas and experience mild abdominal cramping following the procedure. This is due to air being used to inflate your colon during the exam. Walking or a warm pack on your belly (abdomen) may help.   You may resume all normal meals and activities after sedatives and medicines have worn off.   Only take over-the-counter or prescription medicines for pain, discomfort, or fever as directed by your caregiver. Do not use aspirin or blood thinners if a biopsy was taken. Consult your caregiver for medicine usage if biopsies were taken.  SEEK IMMEDIATE MEDICAL CARE IF:   You have a fever.   You pass large blood clots or fill a toilet with blood following the procedure. This may also occur 10 to 14 days following the procedure. This is more likely if a biopsy was taken.   You develop  abdominal pain that keeps getting worse and cannot be relieved with medicine.  Document Released: 12/27/1999 Document Revised: 09/10/2010 Document Reviewed: 08/11/2007 [facility] Patient Information 2012 [place], [place].PATIENT INSTRUCTIONS POST-ANESTHESIA  IMMEDIATELY FOLLOWING SURGERY:  Do not drive or operate machinery for the first twenty four hours after surgery.  Do not make any important decisions for twenty four hours after surgery or while taking narcotic pain medications or sedatives.  If you develop intractable nausea and vomiting or a severe headache please notify your doctor immediately.  FOLLOW-UP:  Please make an appointment with your surgeon as instructed. You do not need to follow up with anesthesia unless specifically instructed to do so.  WOUND CARE INSTRUCTIONS (if applicable):  Keep a dry clean dressing on the anesthesia/puncture wound site if there is drainage.  Once the wound has quit draining you may leave it open to air.  Generally you should leave the bandage intact for twenty four hours unless there is drainage.  If the epidural site drains for more than 36-48 hours please call the anesthesia department.  QUESTIONS?:  Please feel free to call your physician or the hospital operator if you  have any questions, and they will be happy to assist you.     [facility] Anesthesia Department [address] [place] [phone]

## 2011-01-12 ENCOUNTER — Ambulatory Visit (HOSPITAL_COMMUNITY): Admission: RE | Admit: 2011-01-12 | Payer: Medicare Other | Source: Ambulatory Visit | Admitting: Gastroenterology

## 2011-01-12 ENCOUNTER — Encounter (HOSPITAL_COMMUNITY): Admission: RE | Payer: Self-pay | Source: Ambulatory Visit

## 2011-01-12 SURGERY — COLONOSCOPY WITH PROPOFOL
Anesthesia: Monitor Anesthesia Care

## 2011-01-12 NOTE — Interval H&P Note (Signed)
History and Physical Interval Note:  01/12/2011 7:30 AM  Manuel Knight  has presented today for surgery, with the diagnosis of CHRONIC GERD, CONSTIPATION, DYSPHAGIA  The various methods of treatment have been discussed with the patient and family. After consideration of risks, benefits and other options for treatment, the patient has consented to  Procedure(s): COLONOSCOPY WITH PROPOFOL ESOPHAGOGASTRODUODENOSCOPY (EGD) WITH PROPOFOL MALONEY DILATION SAVORY DILATION as a surgical intervention .  The patients' history has been reviewed, patient examined, no change in status, stable for surgery.  I have reviewed the patients' chart and labs.  Questions were answered to the patient's satisfaction.     [employer]

## 2011-01-12 NOTE — H&P (View-Only) (Signed)
Primary Care Physician:  COMSTOCK,LLOYD, MD  Primary Gastroenterologist:  Sandi Fields, MD   Chief Complaint  Patient presents with  . Colonoscopy  . Abdominal Pain  . Diarrhea  . Emesis    HPI:  Manuel Knight is a 50 y.o. male here with referral for colonoscopy from Dr. Lloyd Comstock however when we called to triage patient for procedure he had numerous GI complaints. He was therefore advised to come in for office visit.  He c/o refractory heartburn symptoms on pantoprazole. Bitter taste in mouth. No appetite unless smokes weed. Constant abdominal pain. Pain moves from LLQ to RLQ to epigastrium. Not able to describe pain. Says he cannot get out of bed unless he smokes weed. Pain not always worse with meals but he states he only eats soups or very small amounts because if he eats more then the pain may be worse and he has terrible diarrhea. Diarrhea with sodas and juice. BM 7-8 per day. Wears depends a lot, especially at night. Lots of fecal urgency. No solid stool in years. None since appendectomy in 2007. Talked to lawyers in 2007 about suing for being "sent home too early from the hospital" and they wouldn't listen. C/O urinary urgency. Two tickets for indescent exposure for pulling over on the side of the road to urinate. No blood in stool that knows of. Feels like food hanging up and having wash down. Some intermittent vomiting.   Raising 4 year old grandson.   Doesn't know why Dr. Simpson discharged him from her practice earlier this year. Per notes in centricity, he broke contract for chronic pain management by having positive drug screen for cannabinoids.   Current Outpatient Prescriptions  Medication Sig Dispense Refill  . albuterol (PROVENTIL HFA;VENTOLIN HFA) 108 (90 BASE) MCG/ACT inhaler Inhale 2 puffs into the lungs every 6 (six) hours as needed.        . ALPRAZolam (XANAX) 1 MG tablet Take 1 mg by mouth 4 (four) times daily.        . benazepril-hydrochlorthiazide (LOTENSIN  HCT) 20-12.5 MG per tablet Take 2 tablets by mouth daily.        . cloNIDine (CATAPRES) 0.3 MG tablet Take 0.3 mg by mouth as directed. Take 1 tablet by mouth a day two times a day, take twelve hours apart on a regular schedule (#60)       . diltiazem (CARDIZEM SR) 120 MG 12 hr capsule Take 120 mg by mouth 2 (two) times daily.        . doxazosin (CARDURA) 8 MG tablet Take 8 mg by mouth at bedtime.        . Fluticasone-Salmeterol (ADVAIR DISKUS) 500-50 MCG/DOSE AEPB Inhale 1 puff into the lungs every 12 (twelve) hours.        . glipiZIDE (GLUCOTROL) 10 MG tablet Take 10 mg by mouth 2 (two) times daily before a meal.        . metFORMIN (GLUCOPHAGE) 1000 MG tablet Take 1,000 mg by mouth 2 (two) times daily with a meal.        . oxyCODONE (ROXICODONE) 15 MG immediate release tablet Take 15 mg by mouth 4 (four) times daily.        . pantoprazole (PROTONIX) 40 MG tablet Take 40 mg by mouth daily.        . rosuvastatin (CRESTOR) 40 MG tablet Take 40 mg by mouth at bedtime.        . sitaGLIPtan (JANUVIA) 100 MG tablet Take 100 mg by mouth   daily.        . tiotropium (SPIRIVA) 18 MCG inhalation capsule Place 18 mcg into inhaler and inhale daily.        . zolpidem (AMBIEN) 10 MG tablet Take 10 mg by mouth at bedtime as needed.          Allergies as of 12/15/2010 - Review Complete 12/15/2010  Allergen Reaction Noted  . Propoxyphene n-acetaminophen      Past Medical History  Diagnosis Date  . Anxiety   . Pulmonary nodule   . Hyperlipidemia   . COPD (chronic obstructive pulmonary disease)   . Chronic back pain   . Obesity   . Diabetes mellitus, type 2   . Hypertension   . Cellulitis 2011    of neck   . GERD (gastroesophageal reflux disease)   . Claustrophobia   . Diverticulitis of colon 2008    complicated by abscess  . Heart attack     Past Surgical History  Procedure Date  . Appendectomy 2007    ganrenous appendicitis   . Tonsillectomy     Family History  Problem Relation Age of  Onset  . Heart attack Father   . Diabetes Sister   . Diabetes Sister   . Heart attack Brother   . Colon cancer Neg Hx   . Cancer Mother     "blood transplant"    History   Social History  . Marital Status: Single    Spouse Name: N/A    Number of Children: 2tw  . Years of Education: N/A   Occupational History  . Not on file.   Social History Main Topics  . Smoking status: Former Smoker -- 1.5 packs/day    Types: Cigarettes  . Smokeless tobacco: Not on file   Comment: quit about 25+ yrs  . Alcohol Use: Yes     a couple beer on the weekend  . Drug Use: Yes    Special: Marijuana     current use of marijuana use /for the pain  . Sexually Active: Not on file   Other Topics Concern  . Not on file   Social History Narrative  . No narrative on file      ROS:  General: Negative for weight loss, fever, chills, fatigue, weakness. Eyes: Negative for vision changes.  ENT: Negative for hoarseness, nasal congestion. See HPI. CV: Negative for chest pain, angina, palpitations, dyspnea on exertion, peripheral edema.  Respiratory: Negative for dyspnea at rest, dyspnea on exertion, cough, sputum, wheezing.  GI: See history of present illness. GU:  Negative for dysuria, hematuria, urinary incontinence. See HPI.  MS: Negative for joint pain. Chronic back and neck pain.  Derm: Negative for rash or itching.  Neuro: Negative for weakness, abnormal sensation, seizure, frequent headaches, memory loss, confusion.  Psych: Negative for depression, suicidal ideation, hallucinations. Positive for anxiety and claustrophobia. Endo: Negative for unusual weight change.  Heme: Negative for bruising or bleeding. Allergy: Negative for rash or hives.    Physical Examination:  BP 195/120  Pulse 100  Temp(Src) 97.7 F (36.5 C) (Temporal)  Ht 5' 9" (1.753 m)  Wt 264 lb 12.8 oz (120.112 kg)  BMI 39.10 kg/m2   General: Well-nourished, well-developed in no acute distress. Very anxious. Left the  office to go outside several times.  Head: Normocephalic, atraumatic.   Eyes: Conjunctiva pink, no icterus. Mouth: Oropharyngeal mucosa moist and pink , no lesions erythema or exudate. Neck: Supple without thyromegaly, masses, or lymphadenopathy.  Lungs: Clear to   auscultation bilaterally.  Heart: Regular rate and rhythm, no murmurs rubs or gallops.  Abdomen: Bowel sounds are normal. Mild tenderness even with placing stethoscope on abdomen. Diffuse tenderness. Obese, soft. No hepatosplenomegaly or masses, no abdominal bruits or    hernia , no rebound or guarding.   Rectal: Defer to time of colonoscopy. Extremities: No lower extremity edema. No clubbing or deformities.  Neuro: Alert and oriented x 4 , grossly normal neurologically.  Skin: Warm and dry, no rash or jaundice.   Psych: Alert and cooperative, normal mood and affect. Anxious.   

## 2011-05-21 ENCOUNTER — Encounter (HOSPITAL_COMMUNITY): Payer: Self-pay

## 2011-05-21 ENCOUNTER — Inpatient Hospital Stay (HOSPITAL_COMMUNITY)
Admission: EM | Admit: 2011-05-21 | Discharge: 2011-05-24 | DRG: 189 | Disposition: A | Discharge status: Home / Self Care | Payer: Medicare Other | Attending: Internal Medicine | Admitting: Internal Medicine

## 2011-05-21 ENCOUNTER — Emergency Department (HOSPITAL_COMMUNITY): Payer: Medicare Other

## 2011-05-21 DIAGNOSIS — J9600 Acute respiratory failure, unspecified whether with hypoxia or hypercapnia: Principal | ICD-10-CM | POA: Diagnosis present

## 2011-05-21 DIAGNOSIS — I10 Essential (primary) hypertension: Secondary | ICD-10-CM | POA: Diagnosis present

## 2011-05-21 DIAGNOSIS — J209 Acute bronchitis, unspecified: Secondary | ICD-10-CM | POA: Diagnosis present

## 2011-05-21 DIAGNOSIS — J449 Chronic obstructive pulmonary disease, unspecified: Secondary | ICD-10-CM

## 2011-05-21 DIAGNOSIS — M549 Dorsalgia, unspecified: Secondary | ICD-10-CM | POA: Diagnosis present

## 2011-05-21 DIAGNOSIS — E1165 Type 2 diabetes mellitus with hyperglycemia: Secondary | ICD-10-CM | POA: Diagnosis present

## 2011-05-21 DIAGNOSIS — R071 Chest pain on breathing: Secondary | ICD-10-CM

## 2011-05-21 DIAGNOSIS — K5289 Other specified noninfective gastroenteritis and colitis: Secondary | ICD-10-CM | POA: Diagnosis present

## 2011-05-21 DIAGNOSIS — Z6836 Body mass index (BMI) 36.0-36.9, adult: Secondary | ICD-10-CM

## 2011-05-21 DIAGNOSIS — E1169 Type 2 diabetes mellitus with other specified complication: Secondary | ICD-10-CM | POA: Diagnosis present

## 2011-05-21 DIAGNOSIS — E785 Hyperlipidemia, unspecified: Secondary | ICD-10-CM | POA: Diagnosis present

## 2011-05-21 DIAGNOSIS — N179 Acute kidney failure, unspecified: Secondary | ICD-10-CM | POA: Diagnosis present

## 2011-05-21 DIAGNOSIS — J439 Emphysema, unspecified: Secondary | ICD-10-CM | POA: Diagnosis present

## 2011-05-21 DIAGNOSIS — F411 Generalized anxiety disorder: Secondary | ICD-10-CM | POA: Diagnosis present

## 2011-05-21 DIAGNOSIS — E669 Obesity, unspecified: Secondary | ICD-10-CM | POA: Diagnosis present

## 2011-05-21 DIAGNOSIS — J440 Chronic obstructive pulmonary disease with acute lower respiratory infection: Secondary | ICD-10-CM | POA: Diagnosis present

## 2011-05-21 DIAGNOSIS — Z794 Long term (current) use of insulin: Secondary | ICD-10-CM | POA: Diagnosis present

## 2011-05-21 LAB — DIFFERENTIAL
Basophils Absolute: 0.1 10*3/uL (ref 0.0–0.1)
Basophils Relative: 1 % (ref 0–1)
Eosinophils Absolute: 0.2 10*3/uL (ref 0.0–0.7)
Eosinophils Relative: 2 % (ref 0–5)
Monocytes Absolute: 1.3 10*3/uL — ABNORMAL HIGH (ref 0.1–1.0)

## 2011-05-21 LAB — BLOOD GAS, ARTERIAL
Drawn by: 22223
O2 Content: 2 L/min
O2 Saturation: 97.1 %
pCO2 arterial: 26.7 mmHg — ABNORMAL LOW (ref 35.0–45.0)
pO2, Arterial: 81.1 mmHg (ref 80.0–100.0)

## 2011-05-21 LAB — COMPREHENSIVE METABOLIC PANEL
AST: 65 U/L — ABNORMAL HIGH (ref 0–37)
Albumin: 3.6 g/dL (ref 3.5–5.2)
BUN: 12 mg/dL (ref 6–23)
Calcium: 9.4 mg/dL (ref 8.4–10.5)
Creatinine, Ser: 0.77 mg/dL (ref 0.50–1.35)

## 2011-05-21 LAB — CBC
HCT: 47.2 % (ref 39.0–52.0)
MCH: 31.2 pg (ref 26.0–34.0)
MCHC: 34.5 g/dL (ref 30.0–36.0)
MCV: 90.4 fL (ref 78.0–100.0)
RDW: 12.8 % (ref 11.5–15.5)

## 2011-05-21 LAB — POCT I-STAT TROPONIN I: Troponin i, poc: 0.01 ng/mL (ref 0.00–0.08)

## 2011-05-21 LAB — PRO B NATRIURETIC PEPTIDE: Pro B Natriuretic peptide (BNP): 402.3 pg/mL — ABNORMAL HIGH (ref 0–125)

## 2011-05-21 MED ORDER — ALPRAZOLAM 1 MG PO TABS
1.0000 mg | ORAL_TABLET | Freq: Four times a day (QID) | ORAL | Status: DC
  Administered 2011-05-21 – 2011-05-24 (×9): 1 mg via ORAL
  Filled 2011-05-21 (×2): qty 1
  Filled 2011-05-21: qty 2
  Filled 2011-05-21 (×4): qty 1

## 2011-05-21 MED ORDER — HYDRALAZINE HCL 20 MG/ML IJ SOLN
10.0000 mg | Freq: Once | INTRAMUSCULAR | Status: AC
  Administered 2011-05-21: 10 mg via INTRAVENOUS
  Filled 2011-05-21: qty 1

## 2011-05-21 MED ORDER — MORPHINE SULFATE 4 MG/ML IJ SOLN
4.0000 mg | Freq: Once | INTRAMUSCULAR | Status: AC
  Administered 2011-05-21: 4 mg via INTRAVENOUS
  Filled 2011-05-21: qty 1

## 2011-05-21 MED ORDER — IPRATROPIUM BROMIDE 0.02 % IN SOLN
0.5000 mg | Freq: Once | RESPIRATORY_TRACT | Status: AC
  Administered 2011-05-21: 0.5 mg via RESPIRATORY_TRACT
  Filled 2011-05-21: qty 2.5

## 2011-05-21 MED ORDER — CLONIDINE HCL 0.2 MG PO TABS
0.3000 mg | ORAL_TABLET | Freq: Two times a day (BID) | ORAL | Status: DC
  Administered 2011-05-21 – 2011-05-24 (×6): 0.3 mg via ORAL
  Filled 2011-05-21 (×5): qty 1

## 2011-05-21 MED ORDER — ALBUTEROL SULFATE (5 MG/ML) 0.5% IN NEBU
10.0000 mg | INHALATION_SOLUTION | Freq: Once | RESPIRATORY_TRACT | Status: AC
  Administered 2011-05-21: 10 mg via RESPIRATORY_TRACT
  Filled 2011-05-21: qty 2

## 2011-05-21 MED ORDER — METHYLPREDNISOLONE SODIUM SUCC 125 MG IJ SOLR
125.0000 mg | Freq: Once | INTRAMUSCULAR | Status: AC
  Administered 2011-05-21: 125 mg via INTRAVENOUS
  Filled 2011-05-21: qty 2

## 2011-05-21 MED ORDER — ALPRAZOLAM 0.5 MG PO TABS
ORAL_TABLET | ORAL | Status: AC
  Administered 2011-05-22: 1 mg via ORAL
  Filled 2011-05-21: qty 2

## 2011-05-21 MED ORDER — ALBUTEROL SULFATE (5 MG/ML) 0.5% IN NEBU
5.0000 mg | INHALATION_SOLUTION | Freq: Once | RESPIRATORY_TRACT | Status: AC
  Administered 2011-05-21: 5 mg via RESPIRATORY_TRACT
  Filled 2011-05-21: qty 1

## 2011-05-21 MED ORDER — CLONIDINE HCL 0.1 MG PO TABS
ORAL_TABLET | ORAL | Status: AC
  Administered 2011-05-22: 01:00:00
  Filled 2011-05-21: qty 3

## 2011-05-21 NOTE — ED Notes (Signed)
Respiratory paged for a breathing treatment at this time.  

## 2011-05-21 NOTE — H&P (Signed)
Manuel Knight is an 51 y.o. male.    PCP: Reynolds Bowl, MD, MD   Chief Complaint: Cough and shortness of breath for the last 3 days  HPI: This is a 51 year old, Caucasian male, who has history of, diabetes, COPD, hypertension, chronic back pain, who was in his usual state of health about 3 days ago, when he started having a real bad cough. He was coughing up whitish expectoration. Subsequently started having significant chest pain, especially with coughing. Whenever he would take a deep breath the pain would increase. The pain ranges anywhere from 2-7/10 in intensity. He tried taking breathing treatments and his inhalers without any relief. He thinks he may have had a temperature of 101F 2 days ago. He had some nausea, but denies any vomiting. He's been having severe wheezing. Denies any sick contacts. Due to significant coughing he, apparently passed out once. He also noticed a little blood in his expectoration once. Continues to have sharp chest pains at this time. Pain is precipitated by cough and deep breathing. Sometimes radiates to his back.   Home Medications: Prior to Admission medications   Medication Sig Start Date End Date Taking? Authorizing Provider  albuterol (PROVENTIL HFA;VENTOLIN HFA) 108 (90 BASE) MCG/ACT inhaler Inhale 2 puffs into the lungs every 6 (six) hours as needed. Patient states that he uses 3 times daily   Yes Historical Provider, MD  benazepril-hydrochlorthiazide (LOTENSIN HCT) 20-12.5 MG per tablet Take 2 tablets by mouth daily.     Yes Historical Provider, MD  cloNIDine (CATAPRES) 0.3 MG tablet Take 0.3 mg by mouth as directed. Take 1 tablet by mouth a day two times a day, take twelve hours apart on a regular schedule (#60)    Yes Historical Provider, MD  diltiazem (CARDIZEM SR) 120 MG 12 hr capsule Take 120 mg by mouth daily.    Yes Historical Provider, MD  doxazosin (CARDURA) 8 MG tablet Take 8 mg by mouth at bedtime.     Yes Historical Provider, MD    Fluticasone-Salmeterol (ADVAIR DISKUS) 500-50 MCG/DOSE AEPB Inhale 1 puff into the lungs every 12 (twelve) hours.     Yes Historical Provider, MD  glipiZIDE (GLUCOTROL) 10 MG tablet Take 10 mg by mouth 2 (two) times daily before a meal.     Yes Historical Provider, MD  metFORMIN (GLUCOPHAGE) 1000 MG tablet Take 1,000 mg by mouth 2 (two) times daily with a meal.     Yes Historical Provider, MD  rOPINIRole (REQUIP) 0.25 MG tablet Take 0.25 mg by mouth at bedtime.   Yes Historical Provider, MD  rosuvastatin (CRESTOR) 40 MG tablet Take 40 mg by mouth at bedtime.     Yes Historical Provider, MD  sitaGLIPtan (JANUVIA) 100 MG tablet Take 100 mg by mouth daily.     Yes Historical Provider, MD  tiotropium (SPIRIVA) 18 MCG inhalation capsule Place 18 mcg into inhaler and inhale daily.     Yes Historical Provider, MD  ALPRAZolam Prudy Feeler) 1 MG tablet Take 1 mg by mouth 4 (four) times daily.      Historical Provider, MD  oxyCODONE (ROXICODONE) 15 MG immediate release tablet Take 15 mg by mouth 4 (four) times daily.      Historical Provider, MD  pantoprazole (PROTONIX) 40 MG tablet Take 40 mg by mouth daily.      Historical Provider, MD  zolpidem (AMBIEN) 10 MG tablet Take 10 mg by mouth at bedtime as needed.      Historical Provider, MD  Allergies:  Allergies  Allergen Reactions  . Propoxyphene-Acetaminophen Other (See Comments)    unknown    Past Medical History: Past Medical History  Diagnosis Date  . Anxiety   . Pulmonary nodule   . Hyperlipidemia   . COPD (chronic obstructive pulmonary disease)   . Chronic back pain   . Obesity   . Diabetes mellitus, type 2   . Hypertension   . Cellulitis 2011    of neck   . GERD (gastroesophageal reflux disease)   . Claustrophobia   . Diverticulitis of colon 2008    complicated by abscess  . Heart attack     Past Surgical History  Procedure Date  . Appendectomy 2007    ganrenous appendicitis   . Tonsillectomy     Social History:  reports  that he has quit smoking. His smoking use included Cigarettes. He smoked 1.5 packs per day. He does not have any smokeless tobacco history on file. He reports that he drinks alcohol. He reports that he uses illicit drugs (Marijuana).  Family History:  Family History  Problem Relation Age of Onset  . Heart attack Father   . Diabetes Sister   . Diabetes Sister   . Heart attack Brother   . Colon cancer Neg Hx   . Cancer Mother     "blood transplant"    Review of Systems - History obtained from the patient General ROS: positive for  - fatigue Psychological ROS: anxious Ophthalmic ROS: negative ENT ROS: negative Allergy and Immunology ROS: negative Hematological and Lymphatic ROS: negative Endocrine ROS: negative Respiratory ROS: as in hpi Cardiovascular ROS: as in hpi Gastrointestinal ROS: no abdominal pain, change in bowel habits, or black or bloody stools Genito-Urinary ROS: no dysuria, trouble voiding, or hematuria Musculoskeletal ROS: negative Neurological ROS: no TIA or stroke symptoms Dermatological ROS: negative  Physical Examination Blood pressure 198/116, pulse 120, temperature 98.2 F (36.8 C), temperature source Oral, resp. rate 24, SpO2 98.00%.  General appearance: alert, cooperative, appears stated age, no distress and morbidly obese Head: Normocephalic, without obvious abnormality, atraumatic Eyes: conjunctivae/corneas clear. PERRL, EOM's intact.  Throat: lips, mucosa, and tongue normal; teeth and gums normal Neck: no adenopathy, no carotid bruit, no JVD, supple, symmetrical, trachea midline and thyroid not enlarged, symmetric, no tenderness/mass/nodules Back: symmetric, no curvature. ROM normal. No CVA tenderness. Resp: End expiratory wheezing was heard along with some rhonchi. No crackles. Cardio: Present S2 is tachycardic, regular no S3, S4. No rubs, murmurs, or bruits. No pedal edema. GI: soft, non-tender; bowel sounds normal; no masses,  no  organomegaly Extremities: extremities normal, atraumatic, no cyanosis or edema Pulses: 2+ and symmetric Skin: Skin color, texture, turgor normal. No rashes or lesions Lymph nodes: Cervical, supraclavicular, and axillary nodes normal. Neurologic: Grossly normal  Laboratory Data: Results for orders placed during the hospital encounter of 05/21/11 (from the past 48 hour(s))  CBC     Status: Normal   Collection Time   05/21/11  8:58 PM      Component Value Range Comment   WBC 7.2  4.0 - 10.5 (K/uL)    RBC 5.22  4.22 - 5.81 (MIL/uL)    Hemoglobin 16.3  13.0 - 17.0 (g/dL)    HCT 40.9  81.1 - 91.4 (%)    MCV 90.4  78.0 - 100.0 (fL)    MCH 31.2  26.0 - 34.0 (pg)    MCHC 34.5  30.0 - 36.0 (g/dL)    RDW 78.2  95.6 - 21.3 (%)  Platelets 191  150 - 400 (K/uL)   DIFFERENTIAL     Status: Abnormal   Collection Time   05/21/11  8:58 PM      Component Value Range Comment   Neutrophils Relative 56  43 - 77 (%)    Neutro Abs 4.0  1.7 - 7.7 (K/uL)    Lymphocytes Relative 23  12 - 46 (%)    Lymphs Abs 1.7  0.7 - 4.0 (K/uL)    Monocytes Relative 18 (*) 3 - 12 (%)    Monocytes Absolute 1.3 (*) 0.1 - 1.0 (K/uL)    Eosinophils Relative 2  0 - 5 (%)    Eosinophils Absolute 0.2  0.0 - 0.7 (K/uL)    Basophils Relative 1  0 - 1 (%)    Basophils Absolute 0.1  0.0 - 0.1 (K/uL)   COMPREHENSIVE METABOLIC PANEL     Status: Abnormal   Collection Time   05/21/11  8:58 PM      Component Value Range Comment   Sodium 134 (*) 135 - 145 (mEq/L)    Potassium 3.9  3.5 - 5.1 (mEq/L)    Chloride 99  96 - 112 (mEq/L)    CO2 21  19 - 32 (mEq/L)    Glucose, Bld 279 (*) 70 - 99 (mg/dL)    BUN 12  6 - 23 (mg/dL)    Creatinine, Ser 4.54  0.50 - 1.35 (mg/dL)    Calcium 9.4  8.4 - 10.5 (mg/dL)    Total Protein 7.2  6.0 - 8.3 (g/dL)    Albumin 3.6  3.5 - 5.2 (g/dL)    AST 65 (*) 0 - 37 (U/L)    ALT 100 (*) 0 - 53 (U/L)    Alkaline Phosphatase 81  39 - 117 (U/L)    Total Bilirubin 0.4  0.3 - 1.2 (mg/dL)    GFR calc non  Af Amer >90  >90 (mL/min)    GFR calc Af Amer >90  >90 (mL/min)   PRO B NATRIURETIC PEPTIDE     Status: Abnormal   Collection Time   05/21/11  8:58 PM      Component Value Range Comment   Pro B Natriuretic peptide (BNP) 402.3 (*) 0 - 125 (pg/mL)   BLOOD GAS, ARTERIAL     Status: Abnormal   Collection Time   05/21/11  9:20 PM      Component Value Range Comment   O2 Content 2.0      Delivery systems NASAL CANNULA      pH, Arterial 7.529 (*) 7.350 - 7.450     pCO2 arterial 26.7 (*) 35.0 - 45.0 (mmHg)    pO2, Arterial 81.1  80.0 - 100.0 (mmHg)    Bicarbonate 22.1  20.0 - 24.0 (mEq/L)    TCO2 18.2  0 - 100 (mmol/L)    Acid-base deficit 0.4  0.0 - 2.0 (mmol/L)    O2 Saturation 97.1      Patient temperature 37.0      Collection site LEFT RADIAL      Drawn by [id number]      Sample type ARTERIAL      Allens test (pass/fail) PASS  PASS    POCT I-STAT TROPONIN I     Status: Normal   Collection Time   05/21/11  9:20 PM      Component Value Range Comment   Troponin i, poc 0.01  0.00 - 0.08 (ng/mL)    Comment 3  Radiology Reports: Dg Chest Portable 1 View  05/21/2011  *RADIOLOGY REPORT*  Clinical Data: Chest pain and shortness of breath.  Diabetes.  PORTABLE CHEST - 1 VIEW  Comparison: Portable chest 11/21/2008.  Findings: The heart size is upper limits of normal.  The lungs are clear.  The visualized soft tissues and bony thorax are unremarkable.  IMPRESSION: Negative chest.  Original Report Authenticated By: Jamesetta Orleans. MATTERN, M.D.    Electrocardiogram: Sinus tachycardia at 117 beats per minute. Normal axis. Intervals are normal. No Q waves. No concerning ST or T-wave changes are noted.  Assessment/Plan  Principal Problem:  *Acute respiratory failure Active Problems:  DIABETES MELLITUS, TYPE II  HYPERLIPIDEMIA  Obesity, unspecified  ANXIETY  COPD  Accelerated hypertension   #1 acute respiratory failure: He will be monitored in the step down unit. He'll be given  breathing treatments along with steroids, and antibiotics. Etiology for his respiratory failure is most likely acute bronchitis/acute COPD exacerbation. Differential includes, venous thromboembolism due to his pleuritic chest pain, and tachycardia.  #2 acute bronchitis/COPD exacerbation: Plan as outlined above. Because of his pleuritic chest pain we will check a d-dimer, and, if it's positive, CT angiogram will be done.  #3 diabetes, type II: continue with sliding scale. HbA1c will be checked.  #4 accelerated hypertension: He may not have been very compliant with his medications at home. We will give him a dose of hydralazine and give his clonidine tonight. With that his blood pressure should get better controlled. If not, parenteral agents may be required.  #5 anxiety disorder: continue with his anxiolytic agents.  DVT, prophylaxis will be initiated. He is a full code.  Further management decisions will depend on results of further testing and patient's response to treatment.  [facility]  [facility] Pager [phone]  05/21/2011, 11:00 PM

## 2011-05-21 NOTE — ED Provider Notes (Signed)
History     CSN: [id number]  Arrival date & time 05/21/11  2040   First MD Initiated Contact with Patient 05/21/11 2044      Chief Complaint  Patient presents with  . Shortness of Breath     Patient is a 51 y.o. male presenting with shortness of breath. The history is provided by the patient.  Shortness of Breath  The current episode started 3 to 5 days ago. The onset was gradual. The problem occurs continuously. The problem has been rapidly worsening. The problem is severe. The symptoms are relieved by nothing. The symptoms are aggravated by nothing. Associated symptoms include chest pain, chest pressure, cough, shortness of breath and wheezing.  pt presents for dry cough, shortness of breath, wheezing and chest pressure for past 3 days He reports one episode of syncope while coughing, but no injury is reported He reports he coughs so hard "I can't see" but his vision is currently at baseline He reports home meds are not helping his symptoms   Past Medical History  Diagnosis Date  . Anxiety   . Pulmonary nodule   . Hyperlipidemia   . COPD (chronic obstructive pulmonary disease)   . Chronic back pain   . Obesity   . Diabetes mellitus, type 2   . Hypertension   . Cellulitis 2011    of neck   . GERD (gastroesophageal reflux disease)   . Claustrophobia   . Diverticulitis of colon 2008    complicated by abscess  . Heart attack     Past Surgical History  Procedure Date  . Appendectomy 2007    ganrenous appendicitis   . Tonsillectomy     Family History  Problem Relation Age of Onset  . Heart attack Father   . Diabetes Sister   . Diabetes Sister   . Heart attack Brother   . Colon cancer Neg Hx   . Cancer Mother     "blood transplant"    History  Substance Use Topics  . Smoking status: Former Smoker -- 1.5 packs/day    Types: Cigarettes  . Smokeless tobacco: Not on file   Comment: quit about 25+ yrs  . Alcohol Use: Yes     a couple beer on the weekend       Review of Systems  Respiratory: Positive for cough, shortness of breath and wheezing.   Cardiovascular: Positive for chest pain.  Psychiatric/Behavioral: The patient is nervous/anxious.   All other systems reviewed and are negative.    Allergies  Propoxyphene-acetaminophen  Home Medications   Current Outpatient Rx  Name Route Sig Dispense Refill  . ALBUTEROL SULFATE HFA 108 (90 BASE) MCG/ACT IN AERS Inhalation Inhale 2 puffs into the lungs every 6 (six) hours as needed.      . ALPRAZOLAM 1 MG PO TABS Oral Take 1 mg by mouth 4 (four) times daily.      Marland Kitchen BENAZEPRIL-HYDROCHLOROTHIAZIDE 20-12.5 MG PO TABS Oral Take 2 tablets by mouth daily.      Marland Kitchen CLONIDINE HCL 0.3 MG PO TABS Oral Take 0.3 mg by mouth as directed. Take 1 tablet by mouth a day two times a day, take twelve hours apart on a regular schedule (#60)     . DILTIAZEM HCL ER 120 MG PO CP12 Oral Take 120 mg by mouth 2 (two) times daily.      Marland Kitchen DOXAZOSIN MESYLATE 8 MG PO TABS Oral Take 8 mg by mouth at bedtime.      Marland Kitchen FLUTICASONE-SALMETEROL  500-50 MCG/DOSE IN AEPB Inhalation Inhale 1 puff into the lungs every 12 (twelve) hours.      Marland Kitchen GLIPIZIDE 10 MG PO TABS Oral Take 10 mg by mouth 2 (two) times daily before a meal.      . METFORMIN HCL 1000 MG PO TABS Oral Take 1,000 mg by mouth 2 (two) times daily with a meal.      . OXYCODONE HCL 15 MG PO TABS Oral Take 15 mg by mouth 4 (four) times daily.      Marland Kitchen PANTOPRAZOLE SODIUM 40 MG PO TBEC Oral Take 40 mg by mouth daily.      Marland Kitchen ROSUVASTATIN CALCIUM 40 MG PO TABS Oral Take 40 mg by mouth at bedtime.      Marland Kitchen SITAGLIPTIN PHOSPHATE 100 MG PO TABS Oral Take 100 mg by mouth daily.      Marland Kitchen TIOTROPIUM BROMIDE MONOHYDRATE 18 MCG IN CAPS Inhalation Place 18 mcg into inhaler and inhale daily.      Marland Kitchen ZOLPIDEM TARTRATE 10 MG PO TABS Oral Take 10 mg by mouth at bedtime as needed.       BP 202/108  Pulse 114  Resp 25  SpO2 95% BP 202/108  Pulse 117  Temp(Src) 98.2 F (36.8 C) (Oral)  Resp  22  SpO2 98%  Physical Exam CONSTITUTIONAL: Well developed/well nourished, anxious HEAD AND FACE: Normocephalic/atraumatic EYES: EOMI/PERRL ENMT: Mucous membranes moist NECK: supple no meningeal signs SPINE:entire spine nontender, No bruising/crepitance/stepoffs noted to spine CV: tachycardic S1/S2 noted, no loud murmurs noted LUNGS: tachypneic, wheeze noted bilaterally, but able to speak to me ABDOMEN: soft, nontender, no rebound or guarding GU:no cva tenderness NEURO: Pt is awake/alert, moves all extremitiesx4 EXTREMITIES: pulses normal, full ROM SKIN: warm, color normal PSYCH:anxious  ED Course  Procedures   Labs Reviewed  CBC  DIFFERENTIAL  COMPREHENSIVE METABOLIC PANEL  PRO B NATRIURETIC PEPTIDE   9:22 PM Pt was given NTG by EMS but no response CXR reviewed, suspect COPD, will treat with nebs and reassess 9:52 PM Pt still with wheeze but with some improvement There also appears to be some anxiety component but pt is tachycardic/tachyneic/wheezing Will call [facility] for admission   MDM  Nursing notes reviewed and considered in documentation labs/vitals reviewed and considered xrays reviewed and considered        Date: 05/21/2011  Rate: 117  Rhythm: sinus tachycardia  QRS Axis: normal  Intervals: normal  ST/T Wave abnormalities: nonspecific ST changes  Conduction Disutrbances:none  Narrative Interpretation:   Old EKG Reviewed: unchanged    Joya Gaskins, MD 05/21/11 2155

## 2011-05-21 NOTE — ED Notes (Signed)
Pt reports coughing & passing out. Started about 3 days ago. Been having trouble seeing when he starts coughing.

## 2011-05-21 NOTE — ED Notes (Signed)
Sob per ems, given 2 sprays of ntg for blood pressure 230/145 and 206/134.  Pt c/o chest pain also.

## 2011-05-22 ENCOUNTER — Encounter (HOSPITAL_COMMUNITY): Payer: Self-pay | Admitting: *Deleted

## 2011-05-22 DIAGNOSIS — J9600 Acute respiratory failure, unspecified whether with hypoxia or hypercapnia: Secondary | ICD-10-CM

## 2011-05-22 DIAGNOSIS — J209 Acute bronchitis, unspecified: Secondary | ICD-10-CM

## 2011-05-22 DIAGNOSIS — I10 Essential (primary) hypertension: Secondary | ICD-10-CM

## 2011-05-22 DIAGNOSIS — R071 Chest pain on breathing: Secondary | ICD-10-CM

## 2011-05-22 LAB — CARDIAC PANEL(CRET KIN+CKTOT+MB+TROPI)
CK, MB: 1.7 ng/mL (ref 0.3–4.0)
CK, MB: 2.1 ng/mL (ref 0.3–4.0)
Relative Index: INVALID (ref 0.0–2.5)
Relative Index: INVALID (ref 0.0–2.5)
Troponin I: <0.3 ng/mL (ref <0.30)
Troponin I: <0.3 ng/mL (ref <0.30)
Troponin I: <0.3 ng/mL (ref <0.30)

## 2011-05-22 LAB — GLUCOSE, CAPILLARY: Glucose-Capillary: 474 mg/dL — ABNORMAL HIGH (ref 70–99)

## 2011-05-22 LAB — COMPREHENSIVE METABOLIC PANEL
ALT: 95 U/L — ABNORMAL HIGH (ref 0–53)
AST: 53 U/L — ABNORMAL HIGH (ref 0–37)
Albumin: 3.5 g/dL (ref 3.5–5.2)
Alkaline Phosphatase: 79 U/L (ref 39–117)
Chloride: 97 mEq/L (ref 96–112)
Potassium: 4.4 mEq/L (ref 3.5–5.1)
Sodium: 133 mEq/L — ABNORMAL LOW (ref 135–145)
Total Bilirubin: 0.4 mg/dL (ref 0.3–1.2)
Total Protein: 7.2 g/dL (ref 6.0–8.3)

## 2011-05-22 LAB — RAPID URINE DRUG SCREEN, HOSP PERFORMED
Amphetamines: NOT DETECTED
Barbiturates: NOT DETECTED
Opiates: POSITIVE — AB
Tetrahydrocannabinol: POSITIVE — AB

## 2011-05-22 LAB — CBC
HCT: 46.4 % (ref 39.0–52.0)
MCH: 30.5 pg (ref 26.0–34.0)
MCHC: 33.4 g/dL (ref 30.0–36.0)
MCV: 91.3 fL (ref 78.0–100.0)
Platelets: 189 10*3/uL (ref 150–400)
RDW: 12.8 % (ref 11.5–15.5)
WBC: 7.5 10*3/uL (ref 4.0–10.5)

## 2011-05-22 LAB — TSH: TSH: 3.348 u[IU]/mL (ref 0.350–4.500)

## 2011-05-22 LAB — HEMOGLOBIN A1C: Hgb A1c MFr Bld: 12.2 % — ABNORMAL HIGH (ref <5.7)

## 2011-05-22 LAB — GLUCOSE, RANDOM: Glucose, Bld: 439 mg/dL — ABNORMAL HIGH (ref 70–99)

## 2011-05-22 LAB — MRSA PCR SCREENING: MRSA by PCR: NEGATIVE

## 2011-05-22 MED ORDER — HYDROCOD POLST-CHLORPHEN POLST 10-8 MG/5ML PO LQCR
5.0000 mL | Freq: Two times a day (BID) | ORAL | Status: DC
  Administered 2011-05-22 – 2011-05-24 (×6): 5 mL via ORAL
  Filled 2011-05-22 (×6): qty 5

## 2011-05-22 MED ORDER — INSULIN ASPART 100 UNIT/ML ~~LOC~~ SOLN
0.0000 [IU] | Freq: Three times a day (TID) | SUBCUTANEOUS | Status: DC
  Administered 2011-05-22: 20 [IU] via SUBCUTANEOUS
  Administered 2011-05-23 (×2): 11 [IU] via SUBCUTANEOUS
  Administered 2011-05-23: 7 [IU] via SUBCUTANEOUS
  Administered 2011-05-24: 15 [IU] via SUBCUTANEOUS
  Administered 2011-05-24: 3 [IU] via SUBCUTANEOUS
  Administered 2011-05-24: 11 [IU] via SUBCUTANEOUS

## 2011-05-22 MED ORDER — ROPINIROLE HCL 0.25 MG PO TABS
0.2500 mg | ORAL_TABLET | Freq: Every day | ORAL | Status: DC
  Administered 2011-05-22 – 2011-05-23 (×2): 0.25 mg via ORAL
  Filled 2011-05-22 (×3): qty 1

## 2011-05-22 MED ORDER — ONDANSETRON HCL 4 MG/2ML IJ SOLN
4.0000 mg | Freq: Four times a day (QID) | INTRAMUSCULAR | Status: DC | PRN
  Administered 2011-05-23: 4 mg via INTRAVENOUS
  Filled 2011-05-22: qty 2

## 2011-05-22 MED ORDER — LEVOFLOXACIN IN D5W 500 MG/100ML IV SOLN
INTRAVENOUS | Status: AC
  Filled 2011-05-22: qty 100

## 2011-05-22 MED ORDER — DILTIAZEM HCL ER 60 MG PO CP12
120.0000 mg | ORAL_CAPSULE | Freq: Every day | ORAL | Status: DC
  Filled 2011-05-22: qty 2

## 2011-05-22 MED ORDER — ONDANSETRON HCL 4 MG PO TABS
4.0000 mg | ORAL_TABLET | Freq: Four times a day (QID) | ORAL | Status: DC | PRN
  Administered 2011-05-22: 4 mg via ORAL
  Filled 2011-05-22: qty 1

## 2011-05-22 MED ORDER — LEVOFLOXACIN 500 MG PO TABS
500.0000 mg | ORAL_TABLET | Freq: Every day | ORAL | Status: DC
  Administered 2011-05-22 – 2011-05-23 (×2): 500 mg via ORAL
  Filled 2011-05-22 (×2): qty 1

## 2011-05-22 MED ORDER — FLUTICASONE-SALMETEROL 500-50 MCG/DOSE IN AEPB
1.0000 | INHALATION_SPRAY | Freq: Two times a day (BID) | RESPIRATORY_TRACT | Status: DC
  Administered 2011-05-22 – 2011-05-24 (×4): 1 via RESPIRATORY_TRACT
  Filled 2011-05-22: qty 14

## 2011-05-22 MED ORDER — ACETAMINOPHEN 325 MG PO TABS: 650.0000 mg | ORAL_TABLET | Freq: Four times a day (QID) | ORAL | Status: DC | PRN

## 2011-05-22 MED ORDER — INSULIN GLARGINE 100 UNIT/ML ~~LOC~~ SOLN
20.0000 [IU] | Freq: Every day | SUBCUTANEOUS | Status: DC
  Administered 2011-05-22 – 2011-05-23 (×2): 20 [IU] via SUBCUTANEOUS

## 2011-05-22 MED ORDER — DOXAZOSIN MESYLATE 8 MG PO TABS
8.0000 mg | ORAL_TABLET | Freq: Every day | ORAL | Status: DC
  Administered 2011-05-22 – 2011-05-23 (×2): 8 mg via ORAL
  Filled 2011-05-22 (×2): qty 1

## 2011-05-22 MED ORDER — INSULIN ASPART 100 UNIT/ML ~~LOC~~ SOLN
25.0000 [IU] | Freq: Once | SUBCUTANEOUS | Status: AC
  Administered 2011-05-22: 25 [IU] via SUBCUTANEOUS

## 2011-05-22 MED ORDER — SODIUM CHLORIDE 0.9 % IV SOLN
INTRAVENOUS | Status: DC
  Administered 2011-05-22: 01:00:00 via INTRAVENOUS

## 2011-05-22 MED ORDER — BENAZEPRIL-HYDROCHLOROTHIAZIDE 20-12.5 MG PO TABS: 2.0000 | ORAL_TABLET | Freq: Every day | ORAL | Status: DC

## 2011-05-22 MED ORDER — ATORVASTATIN CALCIUM 40 MG PO TABS
80.0000 mg | ORAL_TABLET | Freq: Every day | ORAL | Status: DC
  Administered 2011-05-22 – 2011-05-24 (×3): 80 mg via ORAL
  Filled 2011-05-22 (×3): qty 2

## 2011-05-22 MED ORDER — TIOTROPIUM BROMIDE MONOHYDRATE 18 MCG IN CAPS
18.0000 ug | ORAL_CAPSULE | Freq: Every day | RESPIRATORY_TRACT | Status: DC
  Administered 2011-05-22 – 2011-05-24 (×3): 18 ug via RESPIRATORY_TRACT
  Filled 2011-05-22: qty 5

## 2011-05-22 MED ORDER — OXYCODONE HCL 5 MG PO TABS
15.0000 mg | ORAL_TABLET | Freq: Four times a day (QID) | ORAL | Status: DC
  Administered 2011-05-22 – 2011-05-24 (×7): 15 mg via ORAL
  Filled 2011-05-22 (×7): qty 3

## 2011-05-22 MED ORDER — ACETAMINOPHEN 650 MG RE SUPP: 650.0000 mg | Freq: Four times a day (QID) | RECTAL | Status: DC | PRN

## 2011-05-22 MED ORDER — GLIPIZIDE 5 MG PO TABS
10.0000 mg | ORAL_TABLET | Freq: Two times a day (BID) | ORAL | Status: DC
  Administered 2011-05-22 – 2011-05-24 (×6): 10 mg via ORAL
  Filled 2011-05-22 (×6): qty 2

## 2011-05-22 MED ORDER — METHYLPREDNISOLONE SODIUM SUCC 125 MG IJ SOLR
80.0000 mg | Freq: Four times a day (QID) | INTRAMUSCULAR | Status: DC
  Administered 2011-05-22 (×2): 80 mg via INTRAVENOUS
  Filled 2011-05-22 (×2): qty 2

## 2011-05-22 MED ORDER — MORPHINE SULFATE 2 MG/ML IJ SOLN: 2.0000 mg | INTRAMUSCULAR | Status: DC | PRN

## 2011-05-22 MED ORDER — INSULIN ASPART 100 UNIT/ML ~~LOC~~ SOLN: 0.0000 [IU] | Freq: Every day | SUBCUTANEOUS | Status: DC

## 2011-05-22 MED ORDER — INSULIN ASPART 100 UNIT/ML ~~LOC~~ SOLN
15.0000 [IU] | Freq: Once | SUBCUTANEOUS | Status: AC
  Administered 2011-05-22: 15 [IU] via SUBCUTANEOUS

## 2011-05-22 MED ORDER — LEVALBUTEROL HCL 0.63 MG/3ML IN NEBU
0.6300 mg | INHALATION_SOLUTION | RESPIRATORY_TRACT | Status: DC | PRN
  Filled 2011-05-22: qty 3

## 2011-05-22 MED ORDER — LEVALBUTEROL HCL 0.63 MG/3ML IN NEBU
0.6300 mg | INHALATION_SOLUTION | Freq: Four times a day (QID) | RESPIRATORY_TRACT | Status: DC
  Administered 2011-05-22 – 2011-05-24 (×6): 0.63 mg via RESPIRATORY_TRACT
  Filled 2011-05-22 (×5): qty 3

## 2011-05-22 MED ORDER — LINAGLIPTIN 5 MG PO TABS
5.0000 mg | ORAL_TABLET | Freq: Every day | ORAL | Status: DC
  Administered 2011-05-22 – 2011-05-24 (×3): 5 mg via ORAL
  Filled 2011-05-22 (×3): qty 1

## 2011-05-22 MED ORDER — PREDNISONE 20 MG PO TABS
40.0000 mg | ORAL_TABLET | Freq: Every day | ORAL | Status: DC
  Administered 2011-05-22 – 2011-05-24 (×3): 40 mg via ORAL
  Filled 2011-05-22 (×3): qty 2

## 2011-05-22 MED ORDER — PANTOPRAZOLE SODIUM 40 MG PO TBEC
40.0000 mg | DELAYED_RELEASE_TABLET | Freq: Every day | ORAL | Status: DC
  Administered 2011-05-22 – 2011-05-24 (×3): 40 mg via ORAL
  Filled 2011-05-22 (×3): qty 1

## 2011-05-22 MED ORDER — BENAZEPRIL HCL 10 MG PO TABS
20.0000 mg | ORAL_TABLET | Freq: Every day | ORAL | Status: DC
  Administered 2011-05-22: 20 mg via ORAL
  Filled 2011-05-22: qty 2

## 2011-05-22 MED ORDER — LEVALBUTEROL HCL 0.63 MG/3ML IN NEBU
0.6300 mg | INHALATION_SOLUTION | RESPIRATORY_TRACT | Status: DC
  Administered 2011-05-22 (×4): 0.63 mg via RESPIRATORY_TRACT
  Filled 2011-05-22 (×4): qty 3

## 2011-05-22 MED ORDER — DILTIAZEM HCL ER COATED BEADS 120 MG PO CP24
120.0000 mg | ORAL_CAPSULE | Freq: Every day | ORAL | Status: DC
  Administered 2011-05-22 – 2011-05-24 (×3): 120 mg via ORAL
  Filled 2011-05-22 (×3): qty 1

## 2011-05-22 MED ORDER — LEVOFLOXACIN IN D5W 500 MG/100ML IV SOLN
500.0000 mg | INTRAVENOUS | Status: DC
  Administered 2011-05-22: 500 mg via INTRAVENOUS
  Filled 2011-05-22: qty 100

## 2011-05-22 MED ORDER — HYDROCHLOROTHIAZIDE 12.5 MG PO CAPS
12.5000 mg | ORAL_CAPSULE | Freq: Every day | ORAL | Status: DC
  Administered 2011-05-22: 12.5 mg via ORAL
  Filled 2011-05-22: qty 1

## 2011-05-22 MED ORDER — ENOXAPARIN SODIUM 40 MG/0.4ML ~~LOC~~ SOLN
40.0000 mg | SUBCUTANEOUS | Status: DC
  Administered 2011-05-22 – 2011-05-24 (×3): 40 mg via SUBCUTANEOUS
  Filled 2011-05-22 (×3): qty 0.4

## 2011-05-22 NOTE — Progress Notes (Signed)
Order given to Lewis Shock, RN.

## 2011-05-22 NOTE — Progress Notes (Signed)
PHARMACIST - PHYSICIAN COMMUNICATION DR:   [facility] CONCERNING: Antibiotic IV to Oral Route Change Policy  RECOMMENDATION: This patient is receiving Levaquin by the intravenous route.  Based on criteria approved by the Pharmacy and Therapeutics Committee, the antibiotic(s) is/are being converted to the equivalent oral dose form(s).   DESCRIPTION: These criteria include:  Patient being treated for a respiratory tract infection, urinary tract infection, or cellulitis  The patient is not neutropenic and does not exhibit a GI malabsorption state  The patient is eating (either orally or via tube) and/or has been taking other orally administered medications for a least 24 hours  The patient is improving clinically and has a Tmax < 100.5  If you have questions about this conversion, please contact the Pharmacy Department  [x]   [phone] )  [facility] []   [phone] )  [facility]  []   [phone] )  [facility] []   [phone] )  [facility]    Emily Filbert [place], [place] 05/22/2011@9 :24 AM

## 2011-05-22 NOTE — Progress Notes (Signed)
Subjective: This man was admitted yesterday with exacerbation of COPD. He says he does not smoke cigarettes anymore. Working diagnosis appears to be bronchitis/COPD. He did have pleuritic chest pain he still does so but d-dimer was not elevated. He has diabetes and is obese.           Physical Exam: Blood pressure 142/77, pulse 76, temperature 98.3 F (36.8 C), temperature source Oral, resp. rate 18, height 5\' 9"  (1.753 m), weight 110.6 kg (243 lb 13.3 oz), SpO2 100.00%. He is obese. There is no increased work of breathing. He looks systemically well. Lung fields are entirely clear. There is no pleural rub. Heart sounds are present and normal without any pericardial rub. He is alert and orientated.   Investigations:  Recent Results (from the past 240 hour(s))  MRSA PCR SCREENING     Status: Normal   Collection Time   05/21/11 11:56 PM      Component Value Range Status Comment   MRSA by PCR NEGATIVE  NEGATIVE  Final      Basic Metabolic Panel:  Basename 05/22/11 0458 05/21/11 2058  NA 133* 134*  K 4.4 3.9  CL 97 99  CO2 22 21  GLUCOSE 424* 279*  BUN 14 12  CREATININE 0.73 0.77  CALCIUM 9.0 9.4  MG -- --  PHOS -- --   Liver Function Tests:  [facility] 05/22/11 0458 05/21/11 2058  AST 53* 65*  ALT 95* 100*  ALKPHOS 79 81  BILITOT 0.4 0.4  PROT 7.2 7.2  ALBUMIN 3.5 3.6     CBC:  Basename 05/22/11 0458 05/21/11 2058  WBC 7.5 7.2  NEUTROABS -- 4.0  HGB 15.5 16.3  HCT 46.4 47.2  MCV 91.3 90.4  PLT 189 191    Dg Chest Portable 1 View  05/21/2011  *RADIOLOGY REPORT*  Clinical Data: Chest pain and shortness of breath.  Diabetes.  PORTABLE CHEST - 1 VIEW  Comparison: Portable chest 11/21/2008.  Findings: The heart size is upper limits of normal.  The lungs are clear.  The visualized soft tissues and bony thorax are unremarkable.  IMPRESSION: Negative chest.  Original Report Authenticated By: Jamesetta Orleans. MATTERN, M.D.      Medications:  Scheduled:   .  albuterol  10 mg Nebulization Once  . albuterol  5 mg Nebulization Once  . ALPRAZolam  1 mg Oral QID  . atorvastatin  80 mg Oral q1800  . benazepril  20 mg Oral Daily  . chlorpheniramine-HYDROcodone  5 mL Oral Q12H  . cloNIDine      . cloNIDine  0.3 mg Oral BID  . diltiazem  120 mg Oral Daily  . doxazosin  8 mg Oral QHS  . enoxaparin  40 mg Subcutaneous Q24H  . Fluticasone-Salmeterol  1 puff Inhalation Q12H  . glipiZIDE  10 mg Oral BID AC  . hydrALAZINE  10 mg Intravenous Once  . hydrochlorothiazide  12.5 mg Oral Daily  . insulin aspart  0-20 Units Subcutaneous TID WC  . insulin aspart  0-5 Units Subcutaneous QHS  . ipratropium  0.5 mg Nebulization Once  . levalbuterol  0.63 mg Nebulization Q4H  . levofloxacin (LEVAQUIN) IV  500 mg Intravenous Q24H  . linagliptin  5 mg Oral Daily  . methylPREDNISolone (SOLU-MEDROL) injection  125 mg Intravenous Once  .  morphine injection  4 mg Intravenous Once  . oxyCODONE  15 mg Oral QID  . pantoprazole  40 mg Oral Daily  . predniSONE  40 mg Oral Q  breakfast  . rOPINIRole  0.25 mg Oral QHS  . tiotropium  18 mcg Inhalation Daily  . DISCONTD: benazepril-hydrochlorthiazide  2 tablet Oral Daily  . DISCONTD: diltiazem  120 mg Oral Daily  . DISCONTD: methylPREDNISolone (SOLU-MEDROL) injection  80 mg Intravenous Q6H    Impression: 1. Acute exacerbation of COPD/bronchitis. 2. Type 2 diabetes mellitus, uncontrolled in the hospital secondary to IV steroids. 3. Anxiety. 4. Hypertension. 5. Obesity.     Plan: 1. Discontinue IV steroids and switched to oral steroids. Continue with the rest of the therapy. 2. Patient can transfer to the regular medical floor today. Probable discharge tomorrow if he continues to be well.     LOS: 1 day   Wilson Singer Pager [phone]  05/22/2011, 7:43 AM

## 2011-05-22 NOTE — Progress Notes (Signed)
UR Chart Review Completed  

## 2011-05-23 DIAGNOSIS — J209 Acute bronchitis, unspecified: Secondary | ICD-10-CM

## 2011-05-23 DIAGNOSIS — J9600 Acute respiratory failure, unspecified whether with hypoxia or hypercapnia: Secondary | ICD-10-CM

## 2011-05-23 DIAGNOSIS — I10 Essential (primary) hypertension: Secondary | ICD-10-CM

## 2011-05-23 DIAGNOSIS — R071 Chest pain on breathing: Secondary | ICD-10-CM

## 2011-05-23 LAB — COMPREHENSIVE METABOLIC PANEL
ALT: 61 U/L — ABNORMAL HIGH (ref 0–53)
AST: 21 U/L (ref 0–37)
Calcium: 9.2 mg/dL (ref 8.4–10.5)
Potassium: 3.9 mEq/L (ref 3.5–5.1)
Sodium: 134 mEq/L — ABNORMAL LOW (ref 135–145)
Total Protein: 6.3 g/dL (ref 6.0–8.3)

## 2011-05-23 LAB — CBC
MCH: 30.1 pg (ref 26.0–34.0)
MCHC: 32.8 g/dL (ref 30.0–36.0)
Platelets: 198 10*3/uL (ref 150–400)
RBC: 4.52 MIL/uL (ref 4.22–5.81)

## 2011-05-23 LAB — GLUCOSE, CAPILLARY
Glucose-Capillary: 280 mg/dL — ABNORMAL HIGH (ref 70–99)
Glucose-Capillary: 383 mg/dL — ABNORMAL HIGH (ref 70–99)
Glucose-Capillary: 413 mg/dL — ABNORMAL HIGH (ref 70–99)

## 2011-05-23 MED ORDER — PROMETHAZINE HCL 25 MG/ML IJ SOLN
12.5000 mg | Freq: Four times a day (QID) | INTRAMUSCULAR | Status: DC | PRN
  Administered 2011-05-23: 12.5 mg via INTRAVENOUS
  Filled 2011-05-23: qty 1

## 2011-05-23 MED ORDER — SODIUM CHLORIDE 0.9 % IJ SOLN
INTRAMUSCULAR | Status: AC
  Administered 2011-05-23: 10 mL
  Filled 2011-05-23: qty 3

## 2011-05-23 MED ORDER — METOCLOPRAMIDE HCL 5 MG/ML IJ SOLN
10.0000 mg | Freq: Three times a day (TID) | INTRAMUSCULAR | Status: DC
  Administered 2011-05-23 – 2011-05-24 (×3): 10 mg via INTRAVENOUS
  Filled 2011-05-23 (×3): qty 2

## 2011-05-23 MED ORDER — GUAIFENESIN ER 600 MG PO TB12
1200.0000 mg | ORAL_TABLET | Freq: Two times a day (BID) | ORAL | Status: DC
  Administered 2011-05-23 – 2011-05-24 (×3): 1200 mg via ORAL
  Filled 2011-05-23 (×3): qty 2

## 2011-05-23 MED ORDER — INSULIN ASPART 100 UNIT/ML ~~LOC~~ SOLN
15.0000 [IU] | Freq: Once | SUBCUTANEOUS | Status: AC
  Administered 2011-05-23: 15 [IU] via SUBCUTANEOUS

## 2011-05-23 MED ORDER — INSULIN ASPART 100 UNIT/ML ~~LOC~~ SOLN
10.0000 [IU] | Freq: Once | SUBCUTANEOUS | Status: AC
  Administered 2011-05-23: 10 [IU] via SUBCUTANEOUS

## 2011-05-23 MED ORDER — SODIUM CHLORIDE 0.9 % IV SOLN
INTRAVENOUS | Status: DC
  Administered 2011-05-23 – 2011-05-24 (×3): via INTRAVENOUS

## 2011-05-23 NOTE — Progress Notes (Signed)
RT attempted to discuss and initiate Incentive Spirometry with patient; however, patient refused and was very adimantly about not using. Patient stated ' that he was already having some nausea and coughing and this would make him worse with coughing and he was not going to do it and he don't want it '. RN notified

## 2011-05-23 NOTE — Progress Notes (Signed)
At 2101, patient's BS was 443. Stat glucose lab was ordered and BS was 468. Doctor was notified and new orders were given to given 15 units of Novolog. At 2352, patient's BS was 413. Doctor was notified and new orders were given for 10 units of Novolog. Blood sugar went below 400 an hour later. Will continue to monitor.

## 2011-05-23 NOTE — Progress Notes (Signed)
05/23/11 1222 Patient stated had another episode of vomiting x 1 with brown emesis and undigested food. Denies any nausea now or prior to vomiting. Notified Dr Kerry Hough of vomiting and EKG results as well. Stated would address and review orders, possibly add phenergan PRN and scheduled reglan. Nursing to monitor.

## 2011-05-23 NOTE — Progress Notes (Signed)
05/23/11 1119 Patient states "i feel bad". C/o nausea, but not further vomiting. Stated "my chest feels heavy again". VSS, O2 sats 97-98% on 2 lpm, states some shortness of breath intermittently, appears comfortable at this time with no labored breathing noted. Stated would like to take po meds despite intermittent nausea. Xanax and oxycodone held at this time d/t patient being drowsy and sleepy. CBG: 266 at this time. Bedalarm on for safety, notified Dr Kerry Hough of patient complaints. Order received to get stat EKG. Order placed and respiratory therapy to be notified. Nursing to monitor.

## 2011-05-23 NOTE — Progress Notes (Signed)
05/23/11 0857 Patient vomited large amount of emesis, appeared undigested food and brown emesis x 1 this am. Patient stated did not feel nauseated prior to vomiting. Notified Dr Kerry Hough, zofran given as ordered, nursing to monitor.

## 2011-05-23 NOTE — Progress Notes (Signed)
Subjective: Breathing is improving, but still cannot take a deep breath, had episode of emesis this am. No abd pain.  Reports chronic diarrhea.  He appears to be frustrated with his overall health.  Objective: Vital signs in last 24 hours: Temp:  [96.9 F (36.1 C)-97.4 F (36.3 C)] 97.4 F (36.3 C) (05/10 2104) Pulse Rate:  [63-91] 63  (05/11 0637) Resp:  [16-20] 16  (05/11 0637) BP: (93-169)/(40-73) 97/58 mmHg (05/11 0637) SpO2:  [94 %-98 %] 94 % (05/11 5621) Weight change:  Last BM Date: 05/21/11  Intake/Output from previous day: 05/10 0701 - 05/11 0700 In: 720 [P.O.:720] Out: 1550 [Urine:1550]     Physical Exam: General: Alert, awake, oriented x3, in no acute distress. HEENT: No bruits, no goiter. Heart: Regular rate and rhythm, without murmurs, rubs, gallops. Lungs: mild exp wheeze b/l. Abdomen: Soft, nontender, nondistended, positive bowel sounds. Extremities: No clubbing cyanosis or edema with positive pedal pulses. Neuro: Grossly intact, nonfocal.    Lab Results: Basic Metabolic Panel:  Basename 05/23/11 0442 05/22/11 2120 05/22/11 0458  NA 134* -- 133*  K 3.9 -- 4.4  CL 97 -- 97  CO2 24 -- 22  GLUCOSE 266* 468* --  BUN 32* -- 14  CREATININE 1.85* -- 0.73  CALCIUM 9.2 -- 9.0  MG -- -- --  PHOS -- -- --   Liver Function Tests:  Basename 05/23/11 0442 05/22/11 0458  AST 21 53*  ALT 61* 95*  ALKPHOS 69 79  BILITOT 0.3 0.4  PROT 6.3 7.2  ALBUMIN 3.0* 3.5   No results found for this basename: LIPASE:2,AMYLASE:2 in the last 72 hours No results found for this basename: AMMONIA:2 in the last 72 hours CBC:  Basename 05/23/11 0442 05/22/11 0458 05/21/11 2058  WBC 15.0* 7.5 --  NEUTROABS -- -- 4.0  HGB 13.6 15.5 --  HCT 41.5 46.4 --  MCV 91.8 91.3 --  PLT 198 189 --   Cardiac Enzymes:  Basename 05/22/11 1615 05/22/11 0750 05/22/11 0035  CKTOTAL 63 72 85  CKMB 2.1 2.0 1.7  CKMBINDEX -- -- --  TROPONINI <0.30 <0.30 <0.30   BNP:  Basename  05/21/11 2058  PROBNP 402.3*   D-Dimer:  Basename 05/21/11 2247  DDIMER 0.32   CBG:  Basename 05/23/11 0722 05/23/11 0100 05/22/11 2352 05/22/11 2101 05/22/11 1641 05/22/11 1409  GLUCAP 249* 383* 413* 443* 412* 474*   Hemoglobin A1C:  Basename 05/22/11 0035  HGBA1C 12.2*   Fasting Lipid Panel: No results found for this basename: CHOL,HDL,LDLCALC,TRIG,CHOLHDL,LDLDIRECT in the last 72 hours Thyroid Function Tests:  Basename 05/22/11 0035  TSH 3.348  T4TOTAL --  Domingo Cocking --  T3FREE --  THYROIDAB --   Anemia Panel: No results found for this basename: VITAMINB12,FOLATE,FERRITIN,TIBC,IRON,RETICCTPCT in the last 72 hours Coagulation: No results found for this basename: LABPROT:2,INR:2 in the last 72 hours Urine Drug Screen: Drugs of Abuse     Component Value Date/Time   LABOPIA POSITIVE* 05/22/2011 1630   COCAINSCRNUR NONE DETECTED 05/22/2011 1630   COCAINSCRNUR NEG 02/10/2010 2320   LABBENZ POSITIVE* 05/22/2011 1630   LABBENZ POS* 02/10/2010 2320   AMPHETMU NONE DETECTED 05/22/2011 1630   AMPHETMU NEG 02/10/2010 2320   THCU POSITIVE* 05/22/2011 1630   LABBARB NONE DETECTED 05/22/2011 1630    Alcohol Level: No results found for this basename: ETH:2 in the last 72 hours Urinalysis: No results found for this basename: COLORURINE:2,APPERANCEUR:2,LABSPEC:2,PHURINE:2,GLUCOSEU:2,HGBUR:2,BILIRUBINUR:2,KETONESUR:2,PROTEINUR:2,UROBILINOGEN:2,NITRITE:2,LEUKOCYTESUR:2 in the last 72 hours  Recent Results (from the past 240 hour(s))  MRSA PCR SCREENING  Status: Normal   Collection Time   05/21/11 11:56 PM      Component Value Range Status Comment   MRSA by PCR NEGATIVE  NEGATIVE  Final     Studies/Results: Dg Chest Portable 1 View  05/21/2011  *RADIOLOGY REPORT*  Clinical Data: Chest pain and shortness of breath.  Diabetes.  PORTABLE CHEST - 1 VIEW  Comparison: Portable chest 11/21/2008.  Findings: The heart size is upper limits of normal.  The lungs are clear.  The visualized soft  tissues and bony thorax are unremarkable.  IMPRESSION: Negative chest.  Original Report Authenticated By: Jamesetta Orleans. MATTERN, M.D.    Medications: Scheduled Meds:   . ALPRAZolam  1 mg Oral QID  . atorvastatin  80 mg Oral q1800  . chlorpheniramine-HYDROcodone  5 mL Oral Q12H  . cloNIDine  0.3 mg Oral BID  . diltiazem  120 mg Oral Daily  . doxazosin  8 mg Oral QHS  . enoxaparin  40 mg Subcutaneous Q24H  . Fluticasone-Salmeterol  1 puff Inhalation Q12H  . glipiZIDE  10 mg Oral BID AC  . insulin aspart  0-20 Units Subcutaneous TID WC  . insulin aspart  0-5 Units Subcutaneous QHS  . insulin aspart  10 Units Subcutaneous Once  . insulin aspart  15 Units Subcutaneous Once  . insulin aspart  25 Units Subcutaneous Once  . insulin glargine  20 Units Subcutaneous Daily  . levalbuterol  0.63 mg Nebulization QID  . levofloxacin  500 mg Oral QHS  . linagliptin  5 mg Oral Daily  . oxyCODONE  15 mg Oral QID  . pantoprazole  40 mg Oral Daily  . predniSONE  40 mg Oral Q breakfast  . rOPINIRole  0.25 mg Oral QHS  . sodium chloride      . tiotropium  18 mcg Inhalation Daily  . DISCONTD: benazepril  20 mg Oral Daily  . DISCONTD: hydrochlorothiazide  12.5 mg Oral Daily  . DISCONTD: levalbuterol  0.63 mg Nebulization Q4H  . DISCONTD: levofloxacin (LEVAQUIN) IV  500 mg Intravenous Q24H   Continuous Infusions:   . sodium chloride 20 mL/hr at 05/22/11 0500   PRN Meds:.acetaminophen, acetaminophen, levalbuterol, ondansetron (ZOFRAN) IV, ondansetron  Assessment/Plan:  Principal Problem:  *Acute respiratory failure Active Problems:  DIABETES MELLITUS, TYPE II  HYPERLIPIDEMIA  Obesity, unspecified  ANXIETY  COPD  Accelerated hypertension  Plan:  1. COPD.  Patient is now on oral steroids. Judging by his outpatient regimen, he appears to have significant COPD.  We will continue with current regimen, add mucinex and incentive spirometry.  Ambulate patient and record oxygen saturations on  ambulation and at rest.  2. Acute renal failure.  Unclear etiology.  Probably pre renal.  Will give gentle hydration, and recheck in am  3. Diabetes.  Uncontrolled in setting of steroids.  Blood sugars have improved today.  4. Anxiety, on large doses of xanax as an outpatient.  5. Dispo.  If resp/renal status has improved, then likely home tomorrow.   LOS: 2 days   Jere Vanburen [facility] Pager: [phone] 05/23/2011, 8:50 AM

## 2011-05-24 DIAGNOSIS — I10 Essential (primary) hypertension: Secondary | ICD-10-CM

## 2011-05-24 DIAGNOSIS — R071 Chest pain on breathing: Secondary | ICD-10-CM

## 2011-05-24 DIAGNOSIS — J209 Acute bronchitis, unspecified: Secondary | ICD-10-CM

## 2011-05-24 DIAGNOSIS — J9600 Acute respiratory failure, unspecified whether with hypoxia or hypercapnia: Secondary | ICD-10-CM

## 2011-05-24 LAB — BASIC METABOLIC PANEL
Calcium: 8.8 mg/dL (ref 8.4–10.5)
GFR calc Af Amer: 81 mL/min — ABNORMAL LOW (ref >90)
GFR calc non Af Amer: 70 mL/min — ABNORMAL LOW (ref >90)
Potassium: 3.9 mEq/L (ref 3.5–5.1)
Sodium: 134 mEq/L — ABNORMAL LOW (ref 135–145)

## 2011-05-24 LAB — GLUCOSE, CAPILLARY
Glucose-Capillary: 131 mg/dL — ABNORMAL HIGH (ref 70–99)
Glucose-Capillary: 289 mg/dL — ABNORMAL HIGH (ref 70–99)

## 2011-05-24 MED ORDER — GUAIFENESIN ER 600 MG PO TB12: 1200.0000 mg | ORAL_TABLET | Freq: Two times a day (BID) | ORAL | Status: AC

## 2011-05-24 MED ORDER — LEVALBUTEROL HCL 0.63 MG/3ML IN NEBU: 1.0000 | INHALATION_SOLUTION | Freq: Four times a day (QID) | RESPIRATORY_TRACT | Status: DC

## 2011-05-24 MED ORDER — METOCLOPRAMIDE HCL 5 MG PO TABS
5.0000 mg | ORAL_TABLET | Freq: Three times a day (TID) | ORAL | Status: DC | PRN
Start: 2011-05-24 — End: 2013-02-03

## 2011-05-24 MED ORDER — HYDROCOD POLST-CHLORPHEN POLST 10-8 MG/5ML PO LQCR: 5.0000 mL | Freq: Two times a day (BID) | ORAL | Status: DC | PRN

## 2011-05-24 MED ORDER — INSULIN GLARGINE 100 UNIT/ML ~~LOC~~ SOLN: 25.0000 [IU] | Freq: Every day | SUBCUTANEOUS | Status: DC

## 2011-05-24 MED ORDER — INSULIN GLARGINE 100 UNIT/ML ~~LOC~~ SOLN
30.0000 [IU] | Freq: Every day | SUBCUTANEOUS | Status: DC
Start: 2011-05-24 — End: 2011-05-24
  Administered 2011-05-24: 30 [IU] via SUBCUTANEOUS

## 2011-05-24 MED ORDER — LEVOFLOXACIN 500 MG PO TABS: 500.0000 mg | ORAL_TABLET | Freq: Every day | ORAL | Status: AC

## 2011-05-24 MED ORDER — PREDNISONE 10 MG PO TABS: ORAL_TABLET | ORAL | Status: DC

## 2011-05-24 NOTE — Progress Notes (Signed)
05/24/11 1230 Patient ambulated hallway with nurse supervision. Tolerated well, no c/o dizziness, weakness, or shortness of breath. O2 sats 85-86% on r/a after ambulation.  O2 sat 92% on 2 lpm on return to room. Dr Kerry Hough aware, order received for home O2. Will notify [facility].

## 2011-05-24 NOTE — Discharge Instructions (Signed)
Chronic Obstructive Pulmonary Disease  Chronic obstructive pulmonary disease (COPD) is a lung disease. The lungs become damaged, making it hard to get air in and out of your lungs. The damage to your lungs cannot be changed.   HOME CARE   Stop smoking if you smoke. Avoid secondhand smoke.   Only take medicine as told by your doctor.   Talk to your doctor about using cough syrup or over-the-counter medicines.   Drink enough fluids to keep your pee (urine) clear or pale yellow.   Use a humidifier or vaporizer. This may help loosen the thick spit (mucus).   Talk to your doctor about vaccines that help prevent other lung problems (pneumonia and flu vaccines).   Use home oxygen as told by your doctor.   Stay active and exercise.   Eat healthy foods.  GET HELP RIGHT AWAY IF:    Your heart is beating fast.   You become disturbed, confused, shake, or are dazed.   You have trouble breathing.   You have chest pain.   You have a fever.   You cough up thick spit that is yellowish-white or green.   Your breathing becomes worse when you exercise.   You are running out of the medicine you take for your breathing.  MAKE SURE YOU:    Understand these instructions.   Will watch your condition.   Will get help right away if you are not doing well or get worse.  Document Released: 06/17/2007 Document Revised: 12/18/2010 Document Reviewed: 02/28/2010  [employer] Patient Information 2012 [employer].

## 2011-05-24 NOTE — Progress Notes (Signed)
05/24/11 1840 Portable home O2 delivered prior to discharge, left floor in stable condition via w/c accompanied by nursing staff.

## 2011-05-24 NOTE — Progress Notes (Signed)
05/24/11 1746 Patient being discharged home. [facility] for home RN, respiratory care, and home O2. Notified [facility] of referral and need for portable home O2 delivered prior to discharge this afternoon. Spoke with [facility] this evening, stated portable O2 to arrive this evening. Reviewed discharge instructions with patient, wife at bedside. Given copy of instructions, med list, prescriptions, f/u appointment information via teachback method. Verbalized understanding. IV site d/c'd and within normal limits. Pt in stable condition awaiting O2 delivery for transport home.

## 2011-05-24 NOTE — Progress Notes (Signed)
Physician Discharge Summary  Patient ID: Manuel Mode MRN: [id number] DOB/AGE: [birth year] 51 y.o.  Admit date: 05/21/2011 Discharge date: 05/24/2011  Primary Care Physician:  Reynolds Bowl, MD, MD   Discharge Diagnoses:    Principal Problem:  *Acute respiratory failure Active Problems:  DIABETES MELLITUS, TYPE II  HYPERLIPIDEMIA  Obesity, unspecified  ANXIETY  COPD  Accelerated hypertension Acute renal failure, resolved   Medication List  As of 05/24/2011 12:52 PM   STOP taking these medications         OXYCONTIN 10 MG 12 hr tablet         TAKE these medications         ADVAIR DISKUS 500-50 MCG/DOSE Aepb   Generic drug: Fluticasone-Salmeterol   Inhale 1 puff into the lungs every 12 (twelve) hours.      albuterol 108 (90 BASE) MCG/ACT inhaler   Commonly known as: PROVENTIL HFA;VENTOLIN HFA   Inhale 2 puffs into the lungs every 6 (six) hours as needed. Patient states that he uses 3 times daily      ALPRAZolam 1 MG tablet   Commonly known as: XANAX   Take 1 mg by mouth 4 (four) times daily.      benazepril-hydrochlorthiazide 20-12.5 MG per tablet   Commonly known as: LOTENSIN HCT   Take 2 tablets by mouth daily.      chlorpheniramine-HYDROcodone 10-8 MG/5ML Lqcr   Commonly known as: TUSSIONEX   Take 5 mLs by mouth every 12 (twelve) hours as needed (cough).      cloNIDine 0.3 MG tablet   Commonly known as: CATAPRES   Take 0.3 mg by mouth as directed. Take 1 tablet by mouth a day two times a day, take twelve hours apart on a regular schedule (#60)      CRESTOR 40 MG tablet   Generic drug: rosuvastatin   Take 40 mg by mouth at bedtime.      diltiazem 120 MG 12 hr capsule   Commonly known as: CARDIZEM SR   Take 120 mg by mouth daily.      doxazosin 8 MG tablet   Commonly known as: CARDURA   Take 8 mg by mouth at bedtime.      glipiZIDE 10 MG tablet   Commonly known as: GLUCOTROL   Take 10 mg by mouth 2 (two) times daily before a meal.     guaiFENesin 600 MG 12 hr tablet   Commonly known as: MUCINEX   Take 2 tablets (1,200 mg total) by mouth 2 (two) times daily.      insulin glargine 100 UNIT/ML injection   Commonly known as: LANTUS   Inject 25 Units into the skin daily.      levalbuterol 0.63 MG/3ML nebulizer solution   Commonly known as: XOPENEX   Take 3 mLs (0.63 mg total) by nebulization 4 (four) times daily.      levofloxacin 500 MG tablet   Commonly known as: LEVAQUIN   Take 1 tablet (500 mg total) by mouth at bedtime.      metFORMIN 1000 MG tablet   Commonly known as: GLUCOPHAGE   Take 1,000 mg by mouth 2 (two) times daily with a meal.      metoCLOPramide 5 MG tablet   Commonly known as: REGLAN   Take 1 tablet (5 mg total) by mouth 3 (three) times daily with meals as needed (nausea/vomiting).      pantoprazole 40 MG tablet   Commonly known as: PROTONIX   Take 40 mg by  mouth daily.      predniSONE 10 MG tablet   Commonly known as: DELTASONE   Take 40mg  po daily for 2 days, then 30mg  po daily for 2 days then 20mg  po daily for 2 days then 10mg  po daily for 2 days then stop      rOPINIRole 0.25 MG tablet   Commonly known as: REQUIP   Take 0.25 mg by mouth at bedtime.      sitaGLIPtin 100 MG tablet   Commonly known as: JANUVIA   Take 100 mg by mouth daily.      tiotropium 18 MCG inhalation capsule   Commonly known as: SPIRIVA   Place 18 mcg into inhaler and inhale daily.           Discharge Exam: Blood pressure 116/66, pulse 81, temperature 97.6 F (36.4 C), temperature source Oral, resp. rate 20, height 5\' 9"  (1.753 m), weight 110.6 kg (243 lb 13.3 oz), SpO2 92.00%. NAD CTA B S1, S2, RRR Soft, NT, BS+ No edema b/l  Disposition and Follow-up:  Follow up with primary doctor in 2 weeks  Consults:  none   Significant Diagnostic Studies:  No results found.  Brief H and P: For complete details please refer to admission H and P, but in brief This is a 51 year old, Caucasian male, who has  history of, diabetes, COPD, hypertension, chronic back pain, who was in his usual state of health about 3 days ago, when he started having a real bad cough. He was coughing up whitish expectoration. Subsequently started having significant chest pain, especially with coughing. Whenever he would take a deep breath the pain would increase. The pain ranges anywhere from 2-7/10 in intensity. He tried taking breathing treatments and his inhalers without any relief. He thinks he may have had a temperature of 101F 2 days ago. He had some nausea, but denies any vomiting. He's been having severe wheezing. Denies any sick contacts. Due to significant coughing he, apparently passed out once. He also noticed a little blood in his expectoration once. Continues to have sharp chest pains at this time. Pain is precipitated by cough and deep breathing. Sometimes radiates to his back.   Hospital Course:  This gentleman was admitted to the hospital with cough and shortness of breath which was felt to be due to a copd exacerbation.  He was started on standard treatment with nebs, steroids and abx.  He currently has improved and is approaching his baseline.  Review of his home medications that patient likely has advanced copd.  Currently he is oxygen saturation on room air is 86%, so he will qualify for home oxygen.  We will start him on a prednisone taper, arrange for home health and oxygen and plan on discharge home.  Patient has had elevated blood sugars in the hospital which is felt to be due to prednisone.  They appear to be reasonably controlled at this point and can be followed as an outpatient  Patient also did have an elevation in his creatinine which was felt to be pre renal due to dehydration.  He was hydrated with iv fluids and creatinine has returned to normal.  He also did have some vomiting, which improved with reglan.  He may have an element of diabetic gastroparesis.  Time spent on  Discharge:  Signed: Houston Zapien [facility] Pager: [phone] 05/24/2011, 12:52 PM

## 2012-06-25 ENCOUNTER — Emergency Department (HOSPITAL_COMMUNITY)
Admission: EM | Admit: 2012-06-25 | Discharge: 2012-06-25 | Discharge status: Against Medical Advice | Payer: Medicare Other | Attending: Emergency Medicine | Admitting: Emergency Medicine

## 2012-06-25 ENCOUNTER — Emergency Department (HOSPITAL_COMMUNITY): Payer: Medicare Other

## 2012-06-25 ENCOUNTER — Encounter (HOSPITAL_COMMUNITY): Payer: Self-pay

## 2012-06-25 DIAGNOSIS — J4489 Other specified chronic obstructive pulmonary disease: Secondary | ICD-10-CM | POA: Insufficient documentation

## 2012-06-25 DIAGNOSIS — I252 Old myocardial infarction: Secondary | ICD-10-CM | POA: Insufficient documentation

## 2012-06-25 DIAGNOSIS — K219 Gastro-esophageal reflux disease without esophagitis: Secondary | ICD-10-CM | POA: Insufficient documentation

## 2012-06-25 DIAGNOSIS — E119 Type 2 diabetes mellitus without complications: Secondary | ICD-10-CM | POA: Insufficient documentation

## 2012-06-25 DIAGNOSIS — Z8739 Personal history of other diseases of the musculoskeletal system and connective tissue: Secondary | ICD-10-CM | POA: Insufficient documentation

## 2012-06-25 DIAGNOSIS — Z8659 Personal history of other mental and behavioral disorders: Secondary | ICD-10-CM | POA: Insufficient documentation

## 2012-06-25 DIAGNOSIS — R209 Unspecified disturbances of skin sensation: Secondary | ICD-10-CM | POA: Insufficient documentation

## 2012-06-25 DIAGNOSIS — Z87891 Personal history of nicotine dependence: Secondary | ICD-10-CM | POA: Insufficient documentation

## 2012-06-25 DIAGNOSIS — R5381 Other malaise: Secondary | ICD-10-CM | POA: Insufficient documentation

## 2012-06-25 DIAGNOSIS — E669 Obesity, unspecified: Secondary | ICD-10-CM | POA: Insufficient documentation

## 2012-06-25 DIAGNOSIS — R51 Headache: Secondary | ICD-10-CM | POA: Insufficient documentation

## 2012-06-25 DIAGNOSIS — F411 Generalized anxiety disorder: Secondary | ICD-10-CM | POA: Insufficient documentation

## 2012-06-25 DIAGNOSIS — R110 Nausea: Secondary | ICD-10-CM | POA: Insufficient documentation

## 2012-06-25 DIAGNOSIS — R29898 Other symptoms and signs involving the musculoskeletal system: Secondary | ICD-10-CM

## 2012-06-25 DIAGNOSIS — I10 Essential (primary) hypertension: Secondary | ICD-10-CM

## 2012-06-25 DIAGNOSIS — M542 Cervicalgia: Secondary | ICD-10-CM

## 2012-06-25 DIAGNOSIS — R200 Anesthesia of skin: Secondary | ICD-10-CM

## 2012-06-25 DIAGNOSIS — G8929 Other chronic pain: Secondary | ICD-10-CM | POA: Insufficient documentation

## 2012-06-25 DIAGNOSIS — E785 Hyperlipidemia, unspecified: Secondary | ICD-10-CM | POA: Insufficient documentation

## 2012-06-25 DIAGNOSIS — Z872 Personal history of diseases of the skin and subcutaneous tissue: Secondary | ICD-10-CM | POA: Insufficient documentation

## 2012-06-25 DIAGNOSIS — Z79899 Other long term (current) drug therapy: Secondary | ICD-10-CM | POA: Insufficient documentation

## 2012-06-25 DIAGNOSIS — J449 Chronic obstructive pulmonary disease, unspecified: Secondary | ICD-10-CM | POA: Insufficient documentation

## 2012-06-25 DIAGNOSIS — IMO0002 Reserved for concepts with insufficient information to code with codable children: Secondary | ICD-10-CM | POA: Insufficient documentation

## 2012-06-25 DIAGNOSIS — Z8719 Personal history of other diseases of the digestive system: Secondary | ICD-10-CM | POA: Insufficient documentation

## 2012-06-25 LAB — CBC WITH DIFFERENTIAL/PLATELET
Eosinophils Absolute: 0.2 10*3/uL (ref 0.0–0.7)
Lymphocytes Relative: 33 % (ref 12–46)
Lymphs Abs: 3.1 10*3/uL (ref 0.7–4.0)
Neutrophils Relative %: 56 % (ref 43–77)
Platelets: 234 10*3/uL (ref 150–400)
RBC: 5.61 MIL/uL (ref 4.22–5.81)
WBC: 9.5 10*3/uL (ref 4.0–10.5)

## 2012-06-25 LAB — BASIC METABOLIC PANEL
CO2: 28 mEq/L (ref 19–32)
GFR calc non Af Amer: >90 mL/min (ref >90)
Glucose, Bld: 320 mg/dL — ABNORMAL HIGH (ref 70–99)
Potassium: 3.8 mEq/L (ref 3.5–5.1)
Sodium: 129 mEq/L — ABNORMAL LOW (ref 135–145)

## 2012-06-25 LAB — GLUCOSE, CAPILLARY: Glucose-Capillary: 346 mg/dL — ABNORMAL HIGH (ref 70–99)

## 2012-06-25 MED ORDER — ONDANSETRON HCL 4 MG/2ML IJ SOLN
4.0000 mg | Freq: Once | INTRAMUSCULAR | Status: AC
  Administered 2012-06-25: 4 mg via INTRAVENOUS
  Filled 2012-06-25: qty 2

## 2012-06-25 MED ORDER — HYDROMORPHONE HCL PF 1 MG/ML IJ SOLN: 1.0000 mg | Freq: Once | INTRAMUSCULAR | Status: DC

## 2012-06-25 MED ORDER — OXYCODONE HCL 5 MG PO TABS
5.0000 mg | ORAL_TABLET | Freq: Once | ORAL | Status: AC
  Administered 2012-06-25: 5 mg via ORAL
  Filled 2012-06-25: qty 1

## 2012-06-25 MED ORDER — HYDROMORPHONE HCL PF 1 MG/ML IJ SOLN
1.0000 mg | Freq: Once | INTRAMUSCULAR | Status: AC
  Administered 2012-06-25: 1 mg via INTRAVENOUS
  Filled 2012-06-25: qty 1

## 2012-06-25 NOTE — ED Notes (Signed)
MD at bedside. 

## 2012-06-25 NOTE — ED Provider Notes (Addendum)
History     CSN: [id number]  Arrival date & time 06/25/12  0009   First MD Initiated Contact with Patient 06/25/12 0330      Chief Complaint  Patient presents with  . Neck Pain    (Consider location/radiation/quality/duration/timing/severity/associated sxs/prior treatment) Patient is a 52 y.o. male presenting with neck pain. The history is provided by the patient.  Neck Pain He has been having sharp pains in the right side of his neck for the last 3 weeks. Recently, pain is radiating down to his back to his lumbar area and into his right leg. Pain is worse if he moves his head and he feels like his head is being drawn to the right side. There is associated nausea and also a headache in the right occipital area. Today, he noticed that he was having weakness in his right leg and he had to drag left leg. He is not aware of any weakness prior to this. Is also been having numbness in his right arm and leg. He also has pain in his feet which is chronic related to his diabetes. He states he has a history of rheumatoid arthritis although it is not specifically mentioned in his medical history. He has not had problems like this before. He is taken over-the-counter pain medications without any relief. He denies any falls or trauma.  Past Medical History  Diagnosis Date  . Anxiety   . Pulmonary nodule   . Hyperlipidemia   . COPD (chronic obstructive pulmonary disease)   . Chronic back pain   . Obesity   . Diabetes mellitus, type 2   . Hypertension   . Cellulitis 2011    of neck   . GERD (gastroesophageal reflux disease)   . Claustrophobia   . Diverticulitis of colon 2008    complicated by abscess  . Heart attack     Past Surgical History  Procedure Laterality Date  . Appendectomy  2007    ganrenous appendicitis   . Tonsillectomy      Family History  Problem Relation Age of Onset  . Heart attack Father   . Diabetes Sister   . Diabetes Sister   . Heart attack Brother   . Colon  cancer Neg Hx   . Cancer Mother     "blood transplant"    History  Substance Use Topics  . Smoking status: Former Smoker -- 1.50 packs/day    Types: Cigarettes  . Smokeless tobacco: Not on file     Comment: quit about 25+ yrs  . Alcohol Use: Yes     Comment: a couple beer on the weekend      Review of Systems  HENT: Positive for neck pain.   All other systems reviewed and are negative.    Allergies  Tylenol; Lodine; and Propoxyphene-acetaminophen  Home Medications   Current Outpatient Rx  Name  Route  Sig  Dispense  Refill  . albuterol (PROVENTIL HFA;VENTOLIN HFA) 108 (90 BASE) MCG/ACT inhaler   Inhalation   Inhale 2 puffs into the lungs every 6 (six) hours as needed. Patient states that he uses 3 times daily         . ALPRAZolam (XANAX) 1 MG tablet   Oral   Take 1 mg by mouth 4 (four) times daily.           Marland Kitchen glipiZIDE (GLUCOTROL) 10 MG tablet   Oral   Take 10 mg by mouth 2 (two) times daily before a meal.           .  levalbuterol (XOPENEX) 0.63 MG/3ML nebulizer solution   Nebulization   Take 3 mLs (0.63 mg total) by nebulization 4 (four) times daily.   100 mL   0   . metFORMIN (GLUCOPHAGE) 1000 MG tablet   Oral   Take 1,000 mg by mouth 2 (two) times daily with a meal.           . benazepril-hydrochlorthiazide (LOTENSIN HCT) 20-12.5 MG per tablet   Oral   Take 2 tablets by mouth daily.           . chlorpheniramine-HYDROcodone (TUSSIONEX) 10-8 MG/5ML LQCR   Oral   Take 5 mLs by mouth every 12 (twelve) hours as needed (cough).   115 mL   0   . cloNIDine (CATAPRES) 0.3 MG tablet   Oral   Take 0.3 mg by mouth as directed. Take 1 tablet by mouth a day two times a day, take twelve hours apart on a regular schedule (#60)          . diltiazem (CARDIZEM SR) 120 MG 12 hr capsule   Oral   Take 120 mg by mouth daily.          Marland Kitchen doxazosin (CARDURA) 8 MG tablet   Oral   Take 8 mg by mouth at bedtime.           . Fluticasone-Salmeterol  (ADVAIR DISKUS) 500-50 MCG/DOSE AEPB   Inhalation   Inhale 1 puff into the lungs every 12 (twelve) hours.           Marland Kitchen EXPIRED: insulin glargine (LANTUS) 100 UNIT/ML injection   Subcutaneous   Inject 25 Units into the skin daily.   10 mL   1   . EXPIRED: metoCLOPramide (REGLAN) 5 MG tablet   Oral   Take 1 tablet (5 mg total) by mouth 3 (three) times daily with meals as needed (nausea/vomiting).   30 tablet   0   . pantoprazole (PROTONIX) 40 MG tablet   Oral   Take 40 mg by mouth daily.           . predniSONE (DELTASONE) 10 MG tablet      Take 40mg  po daily for 2 days, then 30mg  po daily for 2 days then 20mg  po daily for 2 days then 10mg  po daily for 2 days then stop   20 tablet   0   . rOPINIRole (REQUIP) 0.25 MG tablet   Oral   Take 0.25 mg by mouth at bedtime.         . rosuvastatin (CRESTOR) 40 MG tablet   Oral   Take 40 mg by mouth at bedtime.           . sitaGLIPtan (JANUVIA) 100 MG tablet   Oral   Take 100 mg by mouth daily.           Marland Kitchen tiotropium (SPIRIVA) 18 MCG inhalation capsule   Inhalation   Place 18 mcg into inhaler and inhale daily.             BP 210/122  Pulse 110  Temp(Src) 97.7 F (36.5 C) (Oral)  Resp 18  Ht 5\' 9"  (1.753 m)  Wt 256 lb (116.121 kg)  BMI 37.79 kg/m2  SpO2 96%  Physical Exam  Nursing note and vitals reviewed.  52 year old male, resting comfortably and in no acute distress. Vital signs are significant for hypertension with blood pressure 210/122, and tachycardia heart rate 110. Oxygen saturation is 96%, which is normal. Head is normocephalic  and atraumatic. PERRLA, EOMI. Oropharynx is clear. Neck is moderately tender in the right side without any midline tenderness. There is pain with passive movement. There is no adenopathy or JVD. Back is nontender and there is no CVA tenderness. Straight leg raise is positive on the right at 45. Lungs are clear without rales, wheezes, or rhonchi. Chest is nontender. Heart  has regular rate and rhythm without murmur. Abdomen is soft, flat, nontender without masses or hepatosplenomegaly and peristalsis is normoactive. Extremities have no cyanosis or edema, full range of motion is present. Skin is warm and dry without rash. Neurologic: Mental status is normal, cranial nerves are intact. He cooperates poorly, but there does seem to be mild weakness of the right arm with strength 4+/5, and moderate weakness of his right leg with strength 3/5. He has decreased sensation in her throughout his right arm and leg but normal sensation in the trunk. Deep tendon reflexes are symmetric. There are 1+ in the arms and barely detectable in the legs..  ED Course  Procedures (including critical care time)  Results for orders placed during the hospital encounter of 06/25/12  CBC WITH DIFFERENTIAL      Result Value Range   WBC 9.5  4.0 - 10.5 K/uL   RBC 5.61  4.22 - 5.81 MIL/uL   Hemoglobin 17.2 (*) 13.0 - 17.0 g/dL   HCT 16.1  09.6 - 04.5 %   MCV 85.0  78.0 - 100.0 fL   MCH 30.7  26.0 - 34.0 pg   MCHC 36.1 (*) 30.0 - 36.0 g/dL   RDW 40.9  81.1 - 91.4 %   Platelets 234  150 - 400 K/uL   Neutrophils Relative % 56  43 - 77 %   Neutro Abs 5.3  1.7 - 7.7 K/uL   Lymphocytes Relative 33  12 - 46 %   Lymphs Abs 3.1  0.7 - 4.0 K/uL   Monocytes Relative 8  3 - 12 %   Monocytes Absolute 0.8  0.1 - 1.0 K/uL   Eosinophils Relative 2  0 - 5 %   Eosinophils Absolute 0.2  0.0 - 0.7 K/uL   Basophils Relative 1  0 - 1 %   Basophils Absolute 0.1  0.0 - 0.1 K/uL  BASIC METABOLIC PANEL      Result Value Range   Sodium 129 (*) 135 - 145 mEq/L   Potassium 3.8  3.5 - 5.1 mEq/L   Chloride 89 (*) 96 - 112 mEq/L   CO2 28  19 - 32 mEq/L   Glucose, Bld 320 (*) 70 - 99 mg/dL   BUN 20  6 - 23 mg/dL   Creatinine, Ser 7.82  0.50 - 1.35 mg/dL   Calcium 9.9  8.4 - 95.6 mg/dL   GFR calc non Af Amer >90  >90 mL/min   GFR calc Af Amer >90  >90 mL/min   Ct Head Wo Contrast  06/25/2012   *RADIOLOGY  REPORT*  Clinical Data:  NECK PAIN.  CT HEAD WITHOUT CONTRAST CT CERVICAL SPINE WITHOUT CONTRAST  Technique:  Multidetector CT imaging of the head and cervical spine was performed following the standard protocol without IV contrast. Multiplanar CT image reconstructions of the cervical spine were also generated.  Comparison: None available  CT HEAD  Findings: Atherosclerotic and physiologic intracranial calcifications. There is no evidence of acute intracranial hemorrhage, brain edema, mass lesion, acute infarction,   mass effect, or midline shift. Acute infarct may be inapparent on noncontrast CT.  No other intra-axial abnormalities are seen, and the ventricles and sulci are within normal limits in size and symmetry.   No abnormal extra-axial fluid collections or masses are identified.  No significant calvarial abnormality.  IMPRESSION: 1. Negative for bleed or other acute intracranial process.  CT CERVICAL SPINE  Findings: Small anterior endplate spurs and C4-5 and C7-T1. Vertebral body and intervertebral disc height well maintained throughout.  Facets seated.  No prevertebral soft tissue swelling. Negative for fracture.  Bilateral carotid bifurcation calcified plaque.  Remainder paraspinal soft tissues unremarkable.  IMPRESSION:  1.  Negative for fracture or other acute bony abnormality. 2. Degenerative spurring as above. 3.  Bilateral carotid bifurcation calcified plaque.   Original Report Authenticated By: D. Andria Rhein, MD   Ct Cervical Spine Wo Contrast  06/25/2012   *RADIOLOGY REPORT*  Clinical Data:  NECK PAIN.  CT HEAD WITHOUT CONTRAST CT CERVICAL SPINE WITHOUT CONTRAST  Technique:  Multidetector CT imaging of the head and cervical spine was performed following the standard protocol without IV contrast. Multiplanar CT image reconstructions of the cervical spine were also generated.  Comparison: None available  CT HEAD  Findings: Atherosclerotic and physiologic intracranial calcifications. There is no  evidence of acute intracranial hemorrhage, brain edema, mass lesion, acute infarction,   mass effect, or midline shift. Acute infarct may be inapparent on noncontrast CT.  No other intra-axial abnormalities are seen, and the ventricles and sulci are within normal limits in size and symmetry.   No abnormal extra-axial fluid collections or masses are identified.  No significant calvarial abnormality.  IMPRESSION: 1. Negative for bleed or other acute intracranial process.  CT CERVICAL SPINE  Findings: Small anterior endplate spurs and C4-5 and C7-T1. Vertebral body and intervertebral disc height well maintained throughout.  Facets seated.  No prevertebral soft tissue swelling. Negative for fracture.  Bilateral carotid bifurcation calcified plaque.  Remainder paraspinal soft tissues unremarkable.  IMPRESSION:  1.  Negative for fracture or other acute bony abnormality. 2. Degenerative spurring as above. 3.  Bilateral carotid bifurcation calcified plaque.   Original Report Authenticated By: D. Andria Rhein, MD      1. Neck pain on right side   2. Weakness of right leg   3. Numbness in right leg       MDM  Neck pain with right-sided weakness in patient with history of rheumatoid arthritis. I doubt stroke. I am concerned about possibility of spinal cord lesion such as spinal stenosis causing his numbness and weakness. He likely is going to need MRI of his spine, but MRI is not immediately available. I will start with CT scan of the head and cervical spine. Routine laboratory workup is initiated. He is given hydromorphone for pain and ondansetron for nausea.  He got moderate relief of pain with the above-noted medication and so his pain at 7/10. CT was negative an MRI showed only degenerative changes. He MRI is needed to look for possible spinal stenosis. Case has been discussed with Dr. Dierdre Highman, ED physician at [facility], who agrees to accept him in transfer. MRI is not available here today and he  needs to be transferred to a facility where MRI can be done.      Dione Booze, MD 06/25/12 [phone]  Patient is refusing transfer to [facility] stating he wishes to have MRI done as an outpatient in 2 days. I have explained to the patient that I am concerned about spinal cord lesions which could lead to permanent paralysis  of diagnosis is delayed. He expresses understanding and states he still wishes to go home and have MRI done as an outpatient on Monday. On Monday, MRI can be done here. He is allowed to sign out AGAINST MEDICAL ADVICE but advised to return to [facility] if he changes his mind about getting the MRI on an emergent basis.  Dione Booze, MD 06/25/12 [phone]

## 2012-06-25 NOTE — ED Notes (Signed)
Pt wishes to leave AMA, Dr. Preston Fleeting at bedside and discussed this with the patient who is adamant about leaving the hospital.  Advised patient that he is not to drive and needs transportation home due to administration of narcotics while in the ER.  The patient is argumentative with staff about his medications and states that he is going to drive, ED charge nurse notified of situation.

## 2012-06-25 NOTE — ED Notes (Signed)
Pt c/o pain to right side of neck for 3 weeks.  Pt denies injury to same.  Pt states he has seen his pmd for same but they have not addressed it.

## 2012-06-25 NOTE — ED Notes (Signed)
The patient states that he does not want to be transferred via [facility] to [facility] for MRI today, states that he wants to go home and take his medications, states he would rather go and have his MRI on Monday, Dr. Preston Fleeting made aware of the patients' statements and will speak with patient regarding his refusal of transfer.

## 2012-06-25 NOTE — ED Notes (Signed)
Pt reporting that he has weakness on the right hand side when "the pain shoots down my neck". States he "feels like my leg is dragging".

## 2012-06-25 NOTE — ED Notes (Signed)
Pt c/o right sided neck pain. States "it started after my psychiatrist left without telling anyone and now I can't get my meds. No one will write prescriptions for me".

## 2012-08-05 ENCOUNTER — Emergency Department (HOSPITAL_COMMUNITY): Payer: Medicare Other

## 2012-08-05 ENCOUNTER — Emergency Department (HOSPITAL_COMMUNITY)
Admission: EM | Admit: 2012-08-05 | Discharge: 2012-08-05 | Disposition: A | Discharge status: Home / Self Care | Payer: Medicare Other | Attending: Emergency Medicine | Admitting: Emergency Medicine

## 2012-08-05 ENCOUNTER — Encounter (HOSPITAL_COMMUNITY): Payer: Self-pay | Admitting: Emergency Medicine

## 2012-08-05 DIAGNOSIS — Z8719 Personal history of other diseases of the digestive system: Secondary | ICD-10-CM | POA: Insufficient documentation

## 2012-08-05 DIAGNOSIS — E785 Hyperlipidemia, unspecified: Secondary | ICD-10-CM | POA: Insufficient documentation

## 2012-08-05 DIAGNOSIS — F411 Generalized anxiety disorder: Secondary | ICD-10-CM | POA: Insufficient documentation

## 2012-08-05 DIAGNOSIS — N39498 Other specified urinary incontinence: Secondary | ICD-10-CM | POA: Insufficient documentation

## 2012-08-05 DIAGNOSIS — M6281 Muscle weakness (generalized): Secondary | ICD-10-CM

## 2012-08-05 DIAGNOSIS — E669 Obesity, unspecified: Secondary | ICD-10-CM | POA: Insufficient documentation

## 2012-08-05 DIAGNOSIS — M546 Pain in thoracic spine: Secondary | ICD-10-CM | POA: Insufficient documentation

## 2012-08-05 DIAGNOSIS — Z79899 Other long term (current) drug therapy: Secondary | ICD-10-CM | POA: Insufficient documentation

## 2012-08-05 DIAGNOSIS — E1169 Type 2 diabetes mellitus with other specified complication: Secondary | ICD-10-CM | POA: Insufficient documentation

## 2012-08-05 DIAGNOSIS — M549 Dorsalgia, unspecified: Secondary | ICD-10-CM | POA: Insufficient documentation

## 2012-08-05 DIAGNOSIS — Z8679 Personal history of other diseases of the circulatory system: Secondary | ICD-10-CM | POA: Insufficient documentation

## 2012-08-05 DIAGNOSIS — Z872 Personal history of diseases of the skin and subcutaneous tissue: Secondary | ICD-10-CM | POA: Insufficient documentation

## 2012-08-05 DIAGNOSIS — Z87891 Personal history of nicotine dependence: Secondary | ICD-10-CM | POA: Insufficient documentation

## 2012-08-05 DIAGNOSIS — J449 Chronic obstructive pulmonary disease, unspecified: Secondary | ICD-10-CM | POA: Insufficient documentation

## 2012-08-05 DIAGNOSIS — I10 Essential (primary) hypertension: Secondary | ICD-10-CM | POA: Insufficient documentation

## 2012-08-05 DIAGNOSIS — J4489 Other specified chronic obstructive pulmonary disease: Secondary | ICD-10-CM | POA: Insufficient documentation

## 2012-08-05 DIAGNOSIS — K219 Gastro-esophageal reflux disease without esophagitis: Secondary | ICD-10-CM | POA: Insufficient documentation

## 2012-08-05 DIAGNOSIS — R159 Full incontinence of feces: Secondary | ICD-10-CM | POA: Insufficient documentation

## 2012-08-05 DIAGNOSIS — R739 Hyperglycemia, unspecified: Secondary | ICD-10-CM

## 2012-08-05 DIAGNOSIS — Z8659 Personal history of other mental and behavioral disorders: Secondary | ICD-10-CM | POA: Insufficient documentation

## 2012-08-05 DIAGNOSIS — G8929 Other chronic pain: Secondary | ICD-10-CM | POA: Insufficient documentation

## 2012-08-05 LAB — RAPID URINE DRUG SCREEN, HOSP PERFORMED
Amphetamines: NOT DETECTED
Benzodiazepines: POSITIVE — AB
Tetrahydrocannabinol: NOT DETECTED

## 2012-08-05 LAB — URINALYSIS, ROUTINE W REFLEX MICROSCOPIC
Bilirubin Urine: NEGATIVE
Glucose, UA: >1000 mg/dL — AB
Hgb urine dipstick: NEGATIVE
Ketones, ur: NEGATIVE mg/dL
Protein, ur: NEGATIVE mg/dL
pH: 5.5 (ref 5.0–8.0)

## 2012-08-05 LAB — CBC
HCT: 45.7 % (ref 39.0–52.0)
RDW: 12.8 % (ref 11.5–15.5)
WBC: 8.6 10*3/uL (ref 4.0–10.5)

## 2012-08-05 LAB — COMPREHENSIVE METABOLIC PANEL
ALT: 42 U/L (ref 0–53)
AST: 29 U/L (ref 0–37)
Albumin: 3.7 g/dL (ref 3.5–5.2)
Alkaline Phosphatase: 79 U/L (ref 39–117)
BUN: 17 mg/dL (ref 6–23)
Chloride: 91 mEq/L — ABNORMAL LOW (ref 96–112)
Potassium: 4.9 mEq/L (ref 3.5–5.1)
Sodium: 130 mEq/L — ABNORMAL LOW (ref 135–145)
Total Bilirubin: 0.2 mg/dL — ABNORMAL LOW (ref 0.3–1.2)

## 2012-08-05 LAB — URINE MICROSCOPIC-ADD ON

## 2012-08-05 LAB — GLUCOSE, CAPILLARY
Glucose-Capillary: >600 mg/dL (ref 70–99)
Glucose-Capillary: 522 mg/dL — ABNORMAL HIGH (ref 70–99)
Glucose-Capillary: 253 mg/dL — ABNORMAL HIGH (ref 70–99)

## 2012-08-05 MED ORDER — SODIUM CHLORIDE 0.9 % IV SOLN
Freq: Once | INTRAVENOUS | Status: AC
  Administered 2012-08-05: 13:00:00 via INTRAVENOUS

## 2012-08-05 MED ORDER — PANTOPRAZOLE SODIUM 40 MG IV SOLR
40.0000 mg | Freq: Once | INTRAVENOUS | Status: AC
  Administered 2012-08-05: 40 mg via INTRAVENOUS
  Filled 2012-08-05: qty 40

## 2012-08-05 MED ORDER — SODIUM CHLORIDE 0.9 % IV SOLN
INTRAVENOUS | Status: DC
  Administered 2012-08-05: 5.3 [IU]/h via INTRAVENOUS
  Filled 2012-08-05: qty 1

## 2012-08-05 MED ORDER — ALBUTEROL SULFATE (5 MG/ML) 0.5% IN NEBU
2.5000 mg | INHALATION_SOLUTION | Freq: Once | RESPIRATORY_TRACT | Status: AC
  Administered 2012-08-05: 2.5 mg via RESPIRATORY_TRACT
  Filled 2012-08-05: qty 0.5

## 2012-08-05 MED ORDER — MORPHINE SULFATE 4 MG/ML IJ SOLN
4.0000 mg | Freq: Once | INTRAMUSCULAR | Status: AC
  Administered 2012-08-05: 4 mg via INTRAVENOUS
  Filled 2012-08-05: qty 1

## 2012-08-05 MED ORDER — ONDANSETRON HCL 4 MG/2ML IJ SOLN
4.0000 mg | Freq: Once | INTRAMUSCULAR | Status: AC
  Administered 2012-08-05: 4 mg via INTRAVENOUS
  Filled 2012-08-05: qty 2

## 2012-08-05 MED ORDER — IPRATROPIUM BROMIDE 0.02 % IN SOLN
0.5000 mg | Freq: Once | RESPIRATORY_TRACT | Status: AC
  Administered 2012-08-05: 0.5 mg via RESPIRATORY_TRACT
  Filled 2012-08-05: qty 2.5

## 2012-08-05 MED ORDER — OXYCODONE HCL 15 MG PO TABS: 15.0000 mg | ORAL_TABLET | ORAL | Status: DC | PRN

## 2012-08-05 MED ORDER — HYDROMORPHONE HCL PF 1 MG/ML IJ SOLN
1.0000 mg | Freq: Once | INTRAMUSCULAR | Status: DC
  Filled 2012-08-05: qty 1

## 2012-08-05 NOTE — ED Notes (Signed)
pts CBG was 253 reported to nurse

## 2012-08-05 NOTE — ED Notes (Signed)
Started as vein in neck pulling neck to side x 2 months ago   Now has had los of bowel and bladder control also  For same tuime

## 2012-08-05 NOTE — ED Notes (Signed)
CBG greater than 600 notified Rn.

## 2012-08-05 NOTE — ED Provider Notes (Signed)
CSN: [id number]     Arrival date & time 08/05/12  1134 History     First MD Initiated Contact with Patient 08/05/12 1147     No chief complaint on file.  (Consider location/radiation/quality/duration/timing/severity/associated sxs/prior Treatment) HPI  Manuel Knight is a(n) 52 y.o. male with a past medical history of COPD, obesity, insulin-dependent diabetes type 2, GERD, previous MI and chronic back pain who presents the emergency department with chief complaint of loss of bowel and bladder function.  The patient has had ongoing worsening right sided upper and lower extremity pain and weakness for the past 2 months with associated intermittent loss of bowel and bladder function.  Patient states that he will urinate and defecate on himself and has no warning that it is going to occur.  The patient was seen any pain apparently one month ago and left AMA because he was going to be transferred to [facility] and had defecated on himself and was afraid he wouldn't not be cleaned up.  The patient was sent today from [facility].  He has had an MRI performed of the L-spine and C-spine which showed no major abnormalities except for some degenerative disc disease, and disc compression. He should denies any headaches, loss of vision in one eye,  dysphagia, dysarthria.  He has chronic right sided arm and leg weakness worsening over the past 2 months.  He states that he has to drag her right leg a bit.  He denies a history of stroke.  He denies a family history of and mass.  He denies any other focal neurologic symptoms.  Past Medical History  Diagnosis Date  . Anxiety   . Pulmonary nodule   . Hyperlipidemia   . COPD (chronic obstructive pulmonary disease)   . Chronic back pain   . Obesity   . Diabetes mellitus, type 2   . Hypertension   . Cellulitis 2011    of neck   . GERD (gastroesophageal reflux disease)   . Claustrophobia   . Diverticulitis of colon 2008    complicated by abscess  . Heart  attack    Past Surgical History  Procedure Laterality Date  . Appendectomy  2007    ganrenous appendicitis   . Tonsillectomy     Family History  Problem Relation Age of Onset  . Heart attack Father   . Diabetes Sister   . Diabetes Sister   . Heart attack Brother   . Colon cancer Neg Hx   . Cancer Mother     "blood transplant"   History  Substance Use Topics  . Smoking status: Former Smoker -- 1.50 packs/day    Types: Cigarettes  . Smokeless tobacco: Not on file     Comment: quit about 25+ yrs  . Alcohol Use: Yes     Comment: a couple beer on the weekend    Review of Systems Ten systems reviewed and are negative for acute change, except as noted in the HPI.    Allergies  Tylenol; Propoxyphene-acetaminophen; and Lodine  Home Medications   Current Outpatient Rx  Name  Route  Sig  Dispense  Refill  . ALPRAZolam (XANAX) 1 MG tablet   Oral   Take 1 mg by mouth 4 (four) times daily.           . benazepril-hydrochlorthiazide (LOTENSIN HCT) 20-12.5 MG per tablet   Oral   Take 2 tablets by mouth daily.          . cloNIDine (CATAPRES) 0.3 MG  tablet   Oral   Take 0.3 mg by mouth as directed. Take 1 tablet by mouth a day two times a day, take twelve hours apart on a regular schedule (#60)          . diltiazem (CARDIZEM SR) 120 MG 12 hr capsule   Oral   Take 120 mg by mouth daily.          Marland Kitchen doxazosin (CARDURA) 8 MG tablet   Oral   Take 8 mg by mouth at bedtime.           . Fluticasone-Salmeterol (ADVAIR DISKUS) 500-50 MCG/DOSE AEPB   Inhalation   Inhale 1 puff into the lungs every 12 (twelve) hours.           Marland Kitchen glipiZIDE (GLUCOTROL) 10 MG tablet   Oral   Take 10 mg by mouth 2 (two) times daily before a meal.           . insulin glargine (LANTUS) 100 UNIT/ML injection   Subcutaneous   Inject 25 Units into the skin daily.   10 mL   1   . levalbuterol (XOPENEX) 0.63 MG/3ML nebulizer solution   Nebulization   Take 3 mLs (0.63 mg total) by  nebulization 4 (four) times daily.   100 mL   0   . metFORMIN (GLUCOPHAGE) 1000 MG tablet   Oral   Take 1,000 mg by mouth 2 (two) times daily with a meal.           . metoCLOPramide (REGLAN) 5 MG tablet   Oral   Take 1 tablet (5 mg total) by mouth 3 (three) times daily with meals as needed (nausea/vomiting).   30 tablet   0   . pantoprazole (PROTONIX) 40 MG tablet   Oral   Take 40 mg by mouth daily.           . Ranitidine HCl (ZANTAC PO)   Oral   Take 1 tablet by mouth daily as needed (heartburn).         Marland Kitchen rOPINIRole (REQUIP) 0.25 MG tablet   Oral   Take 0.25 mg by mouth at bedtime.         . rosuvastatin (CRESTOR) 40 MG tablet   Oral   Take 40 mg by mouth at bedtime.           . sitaGLIPtan (JANUVIA) 100 MG tablet   Oral   Take 100 mg by mouth daily.           Marland Kitchen tiotropium (SPIRIVA) 18 MCG inhalation capsule   Inhalation   Place 18 mcg into inhaler and inhale daily.           Marland Kitchen albuterol (PROVENTIL HFA;VENTOLIN HFA) 108 (90 BASE) MCG/ACT inhaler   Inhalation   Inhale 2 puffs into the lungs every 6 (six) hours as needed. Patient states that he uses 3 times daily         . predniSONE (DELTASONE) 10 MG tablet      Take 40mg  po daily for 2 days, then 30mg  po daily for 2 days then 20mg  po daily for 2 days then 10mg  po daily for 2 days then stop   20 tablet   0    BP 192/116  Pulse 108  Temp(Src) 97.8 F (36.6 C) (Oral)  Resp 20  SpO2 98% Physical Exam Physical Exam  Nursing note and vitals reviewed. Constitutional: He appears well-developed and well-nourished. No distress.  HENT:  Head: Normocephalic and atraumatic.  Eyes:  Conjunctivae normal are normal. No scleral icterus.  Neck: Normal range of motion. Neck supple.  Cardiovascular: Normal rate, regular rhythm and normal heart sounds.   Pulmonary/Chest:Increased Effort with diffuse inspiratory and expiratory wheezing. Abdominal: Soft. There is no tenderness.  Musculoskeletal: He exhibits  no edema.  Neurological: He is A&O x4. Speech is clear and goal oriented, follows commands Major Cranial nerves without deficit, no facial droop Normal strength in left upper and left lower extremities  Gross weakness of R upper and lower extremities DTRs are equivocal and difficult to elicit BL Sensation normal to light and sharp touch Moves extremities without ataxia, coordination intact Normal finger to nose  Neg romberg, no pronator drift Patient drags right leg slightly when walking He cannot toe or heel walk on the Right leg. Skin: Skin is warm and dry. He is not diaphoretic.  Psychiatric: His behavior is normal.  Digital Rectal Exam reveals sphincter with good tone. Non-thrombosed external hemorrhoids. No masses or fissures. Stool color is brown with no overt blood.   ED Course   Procedures (including critical care time)  Labs Reviewed  COMPREHENSIVE METABOLIC PANEL - Abnormal; Notable for the following:    Sodium 130 (*)    Chloride 91 (*)    Glucose, Bld 587 (*)    Total Bilirubin 0.2 (*)    All other components within normal limits  URINALYSIS, ROUTINE W REFLEX MICROSCOPIC - Abnormal; Notable for the following:    Specific Gravity, Urine 1.034 (*)    Glucose, UA >1000 (*)    All other components within normal limits  URINE RAPID DRUG SCREEN (HOSP PERFORMED) - Abnormal; Notable for the following:    Benzodiazepines POSITIVE (*)    All other components within normal limits  GLUCOSE, CAPILLARY - Abnormal; Notable for the following:    Glucose-Capillary >600 (*)    All other components within normal limits  GLUCOSE, CAPILLARY - Abnormal; Notable for the following:    Glucose-Capillary 522 (*)    All other components within normal limits  GLUCOSE, CAPILLARY - Abnormal; Notable for the following:    Glucose-Capillary 364 (*)    All other components within normal limits  GLUCOSE, CAPILLARY - Abnormal; Notable for the following:    Glucose-Capillary 358 (*)    All  other components within normal limits  GLUCOSE, CAPILLARY - Abnormal; Notable for the following:    Glucose-Capillary 296 (*)    All other components within normal limits  GLUCOSE, CAPILLARY - Abnormal; Notable for the following:    Glucose-Capillary 253 (*)    All other components within normal limits  GLUCOSE, CAPILLARY - Abnormal; Notable for the following:    Glucose-Capillary 270 (*)    All other components within normal limits  CBC  URINE MICROSCOPIC-ADD ON  OCCULT BLOOD, POC DEVICE   No results found. 1. Chronic back pain   2. Muscle weakness of right upper extremity   3. Muscle weakness of lower extremity   4. Bowel and bladder incontinence   5. Hyperglycemia without ketosis     MDM  Patient with sxs concerning for cauda equina, however OP MRI is negative for CE. Patiet's glucose  >600. Hypertensive. He did not take his medications this morning. Patient begun on glucose stabilizer. I have spoken with Dr. Roseanne Reno, on-call neuro hospitalist who recommends proceeding with thoracic MRI.   MRi is negative for any acute abnormality.  I have been lowering the patient's blood glucose levels slowly IV insulin drip.  The patient current Glucose is  270.  The patient has no further wheezes after his nebulizer treatment.  We'll discharge the patient with his normal pain medication prescription.  He will followup with his PCP.  Also given him follow up ad lib. our neurology.  I am unsure as to the etiology of the patient's bowel and bladder incontinence however with his long history of opiate abuse he probably has some chronic chronic constipation which can certainly lead to bowel incontinence.  May also need to his bladder incontinence.  I've discussed this with the patient is a possibility.  The patient agrees to followup with indicated physicians.The patient appears reasonably screened and/or stabilized for discharge and I doubt any other medical condition or other [facility] requiring further  screening, evaluation, or treatment in the ED at this time prior to discharge.   Arthor Captain, PA-C 08/07/12 1756

## 2012-08-05 NOTE — ED Notes (Signed)
Pts CBG was 358 notified nurse.

## 2012-08-05 NOTE — ED Notes (Signed)
Provided the pt with a sprite zero

## 2012-08-05 NOTE — ED Notes (Signed)
Gave pt Sprite zero.

## 2012-08-05 NOTE — ED Notes (Signed)
Pt CBG was 522 notified nurse [address]

## 2012-08-07 NOTE — ED Provider Notes (Signed)
Medical screening examination/treatment/procedure(s) were conducted as a shared visit with non-physician practitioner(s) and myself.  I personally evaluated the patient during the encounter.   Thoracic MRI shows no acute patholgy.   Out patient lumbar MRI negative for CES.  Pt is not in DKA.  He has primary care f/u.  Donnetta Hutching, MD 08/07/12 2101

## 2012-08-23 DIAGNOSIS — M4726 Other spondylosis with radiculopathy, lumbar region: Secondary | ICD-10-CM | POA: Insufficient documentation

## 2013-02-03 ENCOUNTER — Inpatient Hospital Stay (HOSPITAL_COMMUNITY)
Admission: AD | Admit: 2013-02-03 | Discharge: 2013-02-13 | DRG: 234 | Disposition: A | Discharge status: Home / Self Care | Payer: Medicare Other | Source: Other Acute Inpatient Hospital | Attending: Surgery | Admitting: Surgery

## 2013-02-03 ENCOUNTER — Encounter (HOSPITAL_COMMUNITY): Payer: Self-pay | Admitting: Physician Assistant

## 2013-02-03 ENCOUNTER — Encounter (HOSPITAL_COMMUNITY)
Admission: AD | Disposition: A | Discharge status: Home / Self Care | Payer: Self-pay | Source: Other Acute Inpatient Hospital | Attending: Surgery

## 2013-02-03 DIAGNOSIS — I2510 Atherosclerotic heart disease of native coronary artery without angina pectoris: Secondary | ICD-10-CM | POA: Diagnosis present

## 2013-02-03 DIAGNOSIS — Z79899 Other long term (current) drug therapy: Secondary | ICD-10-CM

## 2013-02-03 DIAGNOSIS — Z91199 Patient's noncompliance with other medical treatment and regimen due to unspecified reason: Secondary | ICD-10-CM

## 2013-02-03 DIAGNOSIS — R059 Cough, unspecified: Secondary | ICD-10-CM | POA: Diagnosis not present

## 2013-02-03 DIAGNOSIS — I214 Non-ST elevation (NSTEMI) myocardial infarction: Secondary | ICD-10-CM | POA: Diagnosis present

## 2013-02-03 DIAGNOSIS — G8929 Other chronic pain: Secondary | ICD-10-CM | POA: Diagnosis present

## 2013-02-03 DIAGNOSIS — E785 Hyperlipidemia, unspecified: Secondary | ICD-10-CM | POA: Diagnosis present

## 2013-02-03 DIAGNOSIS — M549 Dorsalgia, unspecified: Secondary | ICD-10-CM | POA: Diagnosis present

## 2013-02-03 DIAGNOSIS — J4489 Other specified chronic obstructive pulmonary disease: Secondary | ICD-10-CM | POA: Diagnosis present

## 2013-02-03 DIAGNOSIS — E669 Obesity, unspecified: Secondary | ICD-10-CM | POA: Diagnosis present

## 2013-02-03 DIAGNOSIS — Z9119 Patient's noncompliance with other medical treatment and regimen: Secondary | ICD-10-CM

## 2013-02-03 DIAGNOSIS — Z794 Long term (current) use of insulin: Secondary | ICD-10-CM

## 2013-02-03 DIAGNOSIS — E8779 Other fluid overload: Secondary | ICD-10-CM | POA: Diagnosis not present

## 2013-02-03 DIAGNOSIS — R109 Unspecified abdominal pain: Secondary | ICD-10-CM

## 2013-02-03 DIAGNOSIS — F411 Generalized anxiety disorder: Secondary | ICD-10-CM | POA: Diagnosis present

## 2013-02-03 DIAGNOSIS — I10 Essential (primary) hypertension: Secondary | ICD-10-CM | POA: Diagnosis present

## 2013-02-03 DIAGNOSIS — I252 Old myocardial infarction: Secondary | ICD-10-CM

## 2013-02-03 DIAGNOSIS — D62 Acute posthemorrhagic anemia: Secondary | ICD-10-CM | POA: Diagnosis not present

## 2013-02-03 DIAGNOSIS — E1165 Type 2 diabetes mellitus with hyperglycemia: Secondary | ICD-10-CM | POA: Diagnosis present

## 2013-02-03 DIAGNOSIS — R05 Cough: Secondary | ICD-10-CM | POA: Diagnosis not present

## 2013-02-03 DIAGNOSIS — IMO0001 Reserved for inherently not codable concepts without codable children: Secondary | ICD-10-CM | POA: Diagnosis present

## 2013-02-03 DIAGNOSIS — J9819 Other pulmonary collapse: Secondary | ICD-10-CM | POA: Diagnosis not present

## 2013-02-03 DIAGNOSIS — J439 Emphysema, unspecified: Secondary | ICD-10-CM | POA: Diagnosis present

## 2013-02-03 DIAGNOSIS — Z951 Presence of aortocoronary bypass graft: Secondary | ICD-10-CM

## 2013-02-03 DIAGNOSIS — J449 Chronic obstructive pulmonary disease, unspecified: Secondary | ICD-10-CM

## 2013-02-03 DIAGNOSIS — Z87891 Personal history of nicotine dependence: Secondary | ICD-10-CM

## 2013-02-03 DIAGNOSIS — A4902 Methicillin resistant Staphylococcus aureus infection, unspecified site: Secondary | ICD-10-CM | POA: Diagnosis present

## 2013-02-03 HISTORY — PX: LEFT HEART CATHETERIZATION WITH CORONARY ANGIOGRAM: SHX5451

## 2013-02-03 LAB — GLUCOSE, CAPILLARY
GLUCOSE-CAPILLARY: 241 mg/dL — AB (ref 70–99)
Glucose-Capillary: 292 mg/dL — ABNORMAL HIGH (ref 70–99)
Glucose-Capillary: 383 mg/dL — ABNORMAL HIGH (ref 70–99)

## 2013-02-03 LAB — MRSA PCR SCREENING: MRSA BY PCR: NEGATIVE

## 2013-02-03 LAB — TROPONIN I: TROPONIN I: 6.4 ng/mL — AB (ref <0.30)

## 2013-02-03 SURGERY — LEFT HEART CATHETERIZATION WITH CORONARY ANGIOGRAM
Anesthesia: LOCAL

## 2013-02-03 MED ORDER — LIDOCAINE HCL (PF) 1 % IJ SOLN
INTRAMUSCULAR | Status: AC
  Filled 2013-02-03: qty 30

## 2013-02-03 MED ORDER — METOPROLOL TARTRATE 1 MG/ML IV SOLN
INTRAVENOUS | Status: AC
  Filled 2013-02-03: qty 5

## 2013-02-03 MED ORDER — ONDANSETRON HCL 4 MG/2ML IJ SOLN: 4.0000 mg | Freq: Four times a day (QID) | INTRAMUSCULAR | Status: DC | PRN

## 2013-02-03 MED ORDER — INSULIN ASPART 100 UNIT/ML FLEXPEN: 0.0000 [IU] | PEN_INJECTOR | Freq: Three times a day (TID) | SUBCUTANEOUS | Status: DC

## 2013-02-03 MED ORDER — TIOTROPIUM BROMIDE MONOHYDRATE 18 MCG IN CAPS
18.0000 ug | ORAL_CAPSULE | Freq: Every day | RESPIRATORY_TRACT | Status: DC
  Administered 2013-02-04 – 2013-02-05 (×2): 18 ug via RESPIRATORY_TRACT
  Filled 2013-02-03: qty 5

## 2013-02-03 MED ORDER — MELOXICAM 15 MG PO TABS
15.0000 mg | ORAL_TABLET | Freq: Every day | ORAL | Status: DC
  Administered 2013-02-03 – 2013-02-05 (×3): 15 mg via ORAL
  Filled 2013-02-03 (×4): qty 1

## 2013-02-03 MED ORDER — HEPARIN (PORCINE) IN NACL 100-0.45 UNIT/ML-% IJ SOLN
1800.0000 [IU]/h | INTRAMUSCULAR | Status: DC
  Administered 2013-02-04: 1400 [IU]/h via INTRAVENOUS
  Filled 2013-02-03 (×4): qty 250

## 2013-02-03 MED ORDER — ALBUTEROL SULFATE (2.5 MG/3ML) 0.083% IN NEBU: 2.5000 mg | INHALATION_SOLUTION | RESPIRATORY_TRACT | Status: DC | PRN

## 2013-02-03 MED ORDER — NITROGLYCERIN IN D5W 200-5 MCG/ML-% IV SOLN
2.0000 ug/min | INTRAVENOUS | Status: DC
  Administered 2013-02-03: 200 ug/min via INTRAVENOUS

## 2013-02-03 MED ORDER — AMLODIPINE BESYLATE 10 MG PO TABS
10.0000 mg | ORAL_TABLET | Freq: Every day | ORAL | Status: DC
  Administered 2013-02-03: 10 mg via ORAL
  Filled 2013-02-03 (×2): qty 1

## 2013-02-03 MED ORDER — VERAPAMIL HCL 2.5 MG/ML IV SOLN
INTRAVENOUS | Status: AC
  Filled 2013-02-03: qty 2

## 2013-02-03 MED ORDER — LEVALBUTEROL HCL 0.63 MG/3ML IN NEBU
0.6300 mg | INHALATION_SOLUTION | Freq: Four times a day (QID) | RESPIRATORY_TRACT | Status: DC
  Administered 2013-02-03: 0.63 mg via RESPIRATORY_TRACT
  Filled 2013-02-03: qty 3

## 2013-02-03 MED ORDER — BENAZEPRIL HCL 20 MG PO TABS
20.0000 mg | ORAL_TABLET | Freq: Every day | ORAL | Status: DC
  Administered 2013-02-03 – 2013-02-04 (×2): 20 mg via ORAL
  Filled 2013-02-03 (×2): qty 1

## 2013-02-03 MED ORDER — HEPARIN SODIUM (PORCINE) 5000 UNIT/ML IJ SOLN
5000.0000 [IU] | Freq: Three times a day (TID) | INTRAMUSCULAR | Status: DC
  Filled 2013-02-03 (×3): qty 1

## 2013-02-03 MED ORDER — INSULIN ASPART 100 UNIT/ML ~~LOC~~ SOLN
0.0000 [IU] | Freq: Three times a day (TID) | SUBCUTANEOUS | Status: DC
  Administered 2013-02-04 (×3): 8 [IU] via SUBCUTANEOUS
  Administered 2013-02-05 (×2): 5 [IU] via SUBCUTANEOUS
  Administered 2013-02-05: 8 [IU] via SUBCUTANEOUS

## 2013-02-03 MED ORDER — METOPROLOL TARTRATE 1 MG/ML IV SOLN
2.5000 mg | INTRAVENOUS | Status: DC | PRN
  Administered 2013-02-03: 2.5 mg via INTRAVENOUS

## 2013-02-03 MED ORDER — PANTOPRAZOLE SODIUM 40 MG PO TBEC: 40.0000 mg | DELAYED_RELEASE_TABLET | Freq: Every day | ORAL | Status: DC

## 2013-02-03 MED ORDER — ASPIRIN 81 MG PO CHEW
81.0000 mg | CHEWABLE_TABLET | Freq: Every day | ORAL | Status: DC
  Administered 2013-02-03 – 2013-02-05 (×3): 81 mg via ORAL
  Filled 2013-02-03 (×3): qty 1

## 2013-02-03 MED ORDER — AMPHETAMINE-DEXTROAMPHETAMINE 10 MG PO TABS
60.0000 mg | ORAL_TABLET | Freq: Every day | ORAL | Status: DC
  Administered 2013-02-04 – 2013-02-05 (×2): 60 mg via ORAL
  Filled 2013-02-03 (×2): qty 6

## 2013-02-03 MED ORDER — GABAPENTIN 300 MG PO CAPS
300.0000 mg | ORAL_CAPSULE | Freq: Three times a day (TID) | ORAL | Status: DC
  Administered 2013-02-03 – 2013-02-05 (×7): 300 mg via ORAL
  Filled 2013-02-03 (×10): qty 1

## 2013-02-03 MED ORDER — HEPARIN (PORCINE) IN NACL 2-0.9 UNIT/ML-% IJ SOLN
INTRAMUSCULAR | Status: AC
  Filled 2013-02-03: qty 1000

## 2013-02-03 MED ORDER — GLIPIZIDE 10 MG PO TABS: 10.0000 mg | ORAL_TABLET | Freq: Two times a day (BID) | ORAL | Status: DC

## 2013-02-03 MED ORDER — ACETAMINOPHEN 325 MG PO TABS: 650.0000 mg | ORAL_TABLET | ORAL | Status: DC | PRN

## 2013-02-03 MED ORDER — SODIUM CHLORIDE 0.9 % IV SOLN
250.0000 mL | INTRAVENOUS | Status: DC | PRN
Start: 2013-02-03 — End: 2013-02-06

## 2013-02-03 MED ORDER — NITROGLYCERIN 0.4 MG SL SUBL: 0.4000 mg | SUBLINGUAL_TABLET | SUBLINGUAL | Status: DC | PRN

## 2013-02-03 MED ORDER — SODIUM CHLORIDE 0.9 % IJ SOLN
3.0000 mL | Freq: Two times a day (BID) | INTRAMUSCULAR | Status: DC
  Administered 2013-02-03 – 2013-02-05 (×5): 3 mL via INTRAVENOUS

## 2013-02-03 MED ORDER — OXYCODONE HCL 5 MG PO TABS
10.0000 mg | ORAL_TABLET | Freq: Two times a day (BID) | ORAL | Status: DC | PRN
  Administered 2013-02-05 (×2): 10 mg via ORAL
  Filled 2013-02-03 (×3): qty 2

## 2013-02-03 MED ORDER — DOXAZOSIN MESYLATE 8 MG PO TABS: 8.0000 mg | ORAL_TABLET | Freq: Every day | ORAL | Status: DC

## 2013-02-03 MED ORDER — CLONIDINE HCL 0.3 MG PO TABS
0.3000 mg | ORAL_TABLET | Freq: Two times a day (BID) | ORAL | Status: DC
  Administered 2013-02-03: 0.3 mg via ORAL
  Filled 2013-02-03 (×3): qty 1

## 2013-02-03 MED ORDER — MOMETASONE FURO-FORMOTEROL FUM 200-5 MCG/ACT IN AERO
2.0000 | INHALATION_SPRAY | Freq: Two times a day (BID) | RESPIRATORY_TRACT | Status: DC
  Administered 2013-02-03 – 2013-02-05 (×5): 2 via RESPIRATORY_TRACT
  Filled 2013-02-03: qty 8.8

## 2013-02-03 MED ORDER — DULOXETINE HCL 30 MG PO CPEP
30.0000 mg | ORAL_CAPSULE | Freq: Every day | ORAL | Status: DC
  Administered 2013-02-03 – 2013-02-05 (×3): 30 mg via ORAL
  Filled 2013-02-03 (×4): qty 1

## 2013-02-03 MED ORDER — ALPRAZOLAM 0.5 MG PO TABS: 1.0000 mg | ORAL_TABLET | Freq: Four times a day (QID) | ORAL | Status: DC

## 2013-02-03 MED ORDER — SODIUM CHLORIDE 0.9 % IJ SOLN: 3.0000 mL | INTRAMUSCULAR | Status: DC | PRN

## 2013-02-03 MED ORDER — CANAGLIFLOZIN 100 MG PO TABS
100.0000 mg | ORAL_TABLET | Freq: Every day | ORAL | Status: DC
  Administered 2013-02-05: 100 mg via ORAL

## 2013-02-03 MED ORDER — PANTOPRAZOLE SODIUM 40 MG PO TBEC
40.0000 mg | DELAYED_RELEASE_TABLET | Freq: Every day | ORAL | Status: DC
  Administered 2013-02-04 – 2013-02-05 (×2): 40 mg via ORAL
  Filled 2013-02-03 (×2): qty 1

## 2013-02-03 MED ORDER — ATORVASTATIN CALCIUM 80 MG PO TABS
80.0000 mg | ORAL_TABLET | Freq: Every day | ORAL | Status: DC
  Administered 2013-02-03 – 2013-02-05 (×3): 80 mg via ORAL
  Filled 2013-02-03 (×4): qty 1

## 2013-02-03 MED ORDER — METFORMIN HCL 500 MG PO TABS
1000.0000 mg | ORAL_TABLET | Freq: Two times a day (BID) | ORAL | Status: DC
  Filled 2013-02-03: qty 2

## 2013-02-03 MED ORDER — ALPRAZOLAM 0.25 MG PO TABS: 0.2500 mg | ORAL_TABLET | Freq: Two times a day (BID) | ORAL | Status: DC | PRN

## 2013-02-03 MED ORDER — FENTANYL CITRATE 0.05 MG/ML IJ SOLN
INTRAMUSCULAR | Status: AC
  Filled 2013-02-03: qty 2

## 2013-02-03 MED ORDER — OXYCODONE HCL 5 MG PO TABS
10.0000 mg | ORAL_TABLET | Freq: Two times a day (BID) | ORAL | Status: DC
  Administered 2013-02-03 – 2013-02-05 (×5): 10 mg via ORAL
  Filled 2013-02-03 (×4): qty 2

## 2013-02-03 MED ORDER — MIDAZOLAM HCL 2 MG/2ML IJ SOLN
INTRAMUSCULAR | Status: AC
  Filled 2013-02-03: qty 2

## 2013-02-03 MED ORDER — HEPARIN SODIUM (PORCINE) 1000 UNIT/ML IJ SOLN
INTRAMUSCULAR | Status: AC
  Filled 2013-02-03: qty 1

## 2013-02-03 MED ORDER — INSULIN DETEMIR 100 UNIT/ML ~~LOC~~ SOLN
80.0000 [IU] | Freq: Every day | SUBCUTANEOUS | Status: DC
  Administered 2013-02-03 – 2013-02-05 (×3): 80 [IU] via SUBCUTANEOUS
  Filled 2013-02-03 (×4): qty 0.8

## 2013-02-03 MED ORDER — ALPRAZOLAM 0.5 MG PO TABS
2.0000 mg | ORAL_TABLET | Freq: Four times a day (QID) | ORAL | Status: DC
  Administered 2013-02-03 – 2013-02-04 (×2): 2 mg via ORAL
  Filled 2013-02-03 (×2): qty 4

## 2013-02-03 MED ORDER — SODIUM CHLORIDE 0.9 % IV SOLN: 1.0000 mL/kg/h | INTRAVENOUS | Status: AC

## 2013-02-03 MED ORDER — FAMOTIDINE 10 MG PO TABS
10.0000 mg | ORAL_TABLET | Freq: Every day | ORAL | Status: DC
Start: 2013-02-03 — End: 2013-02-06
  Administered 2013-02-03 – 2013-02-05 (×3): 10 mg via ORAL
  Filled 2013-02-03 (×4): qty 1

## 2013-02-03 MED ORDER — ALBUTEROL SULFATE HFA 108 (90 BASE) MCG/ACT IN AERS: 2.0000 | INHALATION_SPRAY | RESPIRATORY_TRACT | Status: DC | PRN

## 2013-02-03 MED ORDER — DILTIAZEM HCL ER 60 MG PO CP12: 120.0000 mg | ORAL_CAPSULE | Freq: Every day | ORAL | Status: DC

## 2013-02-03 NOTE — Progress Notes (Signed)
Dr. Andy Gauss updated on patient blood pressure 35W systolic with a map of 54 and troponin of 6.4.  Patient arouses to verbal stimuli.  No changes in mentation noted.  Nitro turned off.  IVF d/c at 10pm per order.  Patient denies pain at this time.  Will continue to monitor.

## 2013-02-03 NOTE — Interval H&P Note (Signed)
Cath Lab Visit (complete for each Cath Lab visit)  Clinical Evaluation Leading to the Procedure:   ACS: yes  Non-ACS:    Anginal Classification: CCS IV  Anti-ischemic medical therapy: Minimal Therapy (1 class of medications)  Non-Invasive Test Results: No non-invasive testing performed  Prior CABG: No previous CABG      History and Physical Interval Note:  02/03/2013 4:11 PM  Manuel Knight  has presented today for surgery, with the diagnosis of Chest pain  The various methods of treatment have been discussed with the patient and family. After consideration of risks, benefits and other options for treatment, the patient has consented to  Procedure(s): LEFT HEART CATHETERIZATION WITH CORONARY ANGIOGRAM (N/A) as a surgical intervention .  The patient's history has been reviewed, patient examined, no change in status, stable for surgery.  I have reviewed the patient's chart and labs.  Questions were answered to the patient's satisfaction.     Manuel Knight S.

## 2013-02-03 NOTE — Progress Notes (Signed)
TR band completley deflated , no bleeding noted. Endorsed to  Remove TR band after 1 hour.

## 2013-02-03 NOTE — Progress Notes (Signed)
Admitted from [place]. With nitro gtt at 50 mcg and increased to 60 mcg due to left shoulder pain and heparin gtt at 12.8 ml/hr to right wrist.

## 2013-02-03 NOTE — Progress Notes (Signed)
Back from the cath lab. With TR band to right radial in placed. Pale palpable. Pulse ox probe to right thumb. Refused blood drawing at this time as reported by the phlebotomist.

## 2013-02-03 NOTE — H&P (Signed)
History and Physical   Patient ID: Manuel Knight MRN: [id number], DOB/AGE: [birth year] 53 y.o. Date of Encounter: 02/03/2013  Primary Physician: Marval Regal, MD Primary Cardiologist:  Audrie Lia MD 15 yr ago  Chief Complaint:  NSTEMI  HPI: Manuel Knight is a 54 y.o. male with a possible history of CAD. He describes and admission to [facility] about 15 years ago with a "small heart attack" but does not remember ever getting a stress test or cath. Saw cardiology in [place] after that for a while, but quit going.  6 days ago on Sunday, he developed left arm pain in church. It was severe but intermittent. Starting 2 days ago, the pain has been continuous. It is an 8/10, mainly in his left arm and shoulder. He does not think he gets SOB from the pain but has had diaphoresis. He has not had N&V but his appetite has been poor. The pain gives him panic attacks and he struggles with these. This am, the symptoms were so bad, he went to [facility] ER. There, his ECG was abnormal and cardiac enzymes were elevated so he was transferred to [facility]   At [facility], he was treated with ASA 81 mg x 4, NTG ointment, heparin bolus and gtt, Lorazepam 1 mg IV, Duoneb x 1, morphine 6 mg, Zofran 4 mg, IV NTG, titrated for pain-control.   Past Medical History  Diagnosis Date  . Anxiety   . Pulmonary nodule   . Hyperlipidemia   . COPD (chronic obstructive pulmonary disease)   . Chronic back pain   . Obesity   . Diabetes mellitus, type 2   . Hypertension   . Cellulitis 2011    of neck   . GERD (gastroesophageal reflux disease)   . Claustrophobia   . Diverticulitis of colon 2993    complicated by abscess  . Heart attack     Surgical History:  Past Surgical History  Procedure Laterality Date  . Appendectomy  2007    gangrenous appendicitis   . Tonsillectomy       I have reviewed the patient's current medications. Prior to Admission medications - not yet confirmed  Medication Sig Start Date End  Date Taking? Authorizing Provider  albuterol (PROVENTIL HFA;VENTOLIN HFA) 108 (90 BASE) MCG/ACT inhaler Inhale 2 puffs into the lungs every 6 (six) hours as needed. Patient states that he uses 3 times daily    Historical Provider, MD  ALPRAZolam [name]) 1 MG tablet Take 2 mg by mouth 4 (four) times daily.      Historical Provider, MD  benazepril-hydrochlorthiazide (LOTENSIN HCT) 20-12.5 MG per tablet Take 2 tablets by mouth daily.     Historical Provider, MD  Fluticasone-Salmeterol (ADVAIR DISKUS) 500-50 MCG/DOSE AEPB Inhale 1 puff into the lungs every 12 (twelve) hours.      Historical Provider, MD  levalbuterol Penne Lash) 0.63 MG/3ML nebulizer solution Take 3 mLs (0.63 mg total) by nebulization 4 (four) times daily. 05/24/11 08/05/12  [name], MD  metFORMIN (GLUCOPHAGE) 1000 MG tablet Take 1,000 mg by mouth 2 (two) times daily with a meal.      Historical Provider, MD  oxyCODONE (ROXICODONE) 15 MG immediate release tablet Take 1 tablet (15 mg total) by mouth every 4 (four) x day 08/05/12   [name], PA-C  tiotropium (SPIRIVA) 18 MCG inhalation capsule Place 18 mcg into inhaler and inhale daily.      Historical Provider, MD   Scheduled Meds: Continuous Infusions: PRN Meds:.metoprolol  Allergies:  Allergies  Allergen Reactions  . Tylenol [Acetaminophen] Hives  . Propoxyphene N-Acetaminophen Other (See Comments)    unknown  . Lodine [Etodolac] Swelling and Rash    History   Social History  . Marital Status: Single    Spouse Name: N/A    Number of Children: 2tw  . Years of Education: N/A   Occupational History  . Disabled    Social History Main Topics  . Smoking status: Former Smoker -- 1.50 packs/day    Types: Cigarettes    Quit date: 01/13/1988  . Smokeless tobacco: Never Used  . Alcohol Use: No     Comment:    . Drug Use: No     Comment: Formerly used marijuana for the pain, denies now.  . Sexual Activity: Not on file   Other Topics Concern  . Not on file    Social History Narrative   Lives with significant other of 18 years. They are raising his grandson.    Family History  Problem Relation Age of Onset  . Heart attack Father   . Diabetes Sister   . Coronary artery disease Sister   . Heart attack Brother   . Colon cancer Neg Hx   . Cancer Mother     "blood transplant"  . Coronary artery disease Mother    Family Status  Relation Status Death Age  . Mother Deceased   . Father Deceased   . Sister Alive   . Sister Alive   . Brother Alive     Review of Systems: Multiple chronic somatic complaints. Panic attacks and anxiety are severe. Cannot take a deep breath without coughing such that he cannot see the TV.  Full 14-point review of systems otherwise negative except as noted above.  Physical Exam: Blood pressure 152/98, pulse 99, temperature 98.7 F (37.1 C), temperature source Oral, resp. rate 22, height '5\' 9"'  (1.753 m), weight 258 lb 9.6 oz (117.3 kg), SpO2 96.00%. General: Well developed, well nourished,male in acute distress. Head: Normocephalic, atraumatic, sclera non-icteric, no xanthomas, nares are without discharge. Dentition:normal  Neck: No carotid bruits. JVD not elevated. No thyromegally Lungs: Good expansion bilaterally. without wheezes or rhonchi.  Heart: Regular rate and rhythm with S1 S2.  No S3 or S4.  No murmur, no rubs, or gallops appreciated. Abdomen: Soft, non-tender, non-distended with normoactive bowel sounds. No hepatomegaly. No rebound/guarding. No obvious abdominal masses. Msk:  Strength and tone appear normal for age. No joint deformities or effusions, no spine or costo-vertebral angle tenderness. Extremities: No clubbing or cyanosis. No edema.  Distal pedal pulses are 2+ in 4 extrem Neuro: Alert and oriented X 3. Moves all extremities spontaneously. No focal deficits noted. Psych:  Responds to questions appropriately with a very anxious affect. Skin: No rashes or lesions noted  Labs:   Lab Results   Component Value Date   WBC 13.7 02/03/2013   HGB 15.3 02/03/2013   HCT 46.3 02/03/2013   MCV 89.1 02/03/2013   PLT 225 02/03/2013        INR 1.0         NA 131    K 3.9    CL 95    CO2 27    BUN 14    CR 0.87    GLU 334    ALB 3.9    ALK PH 65    AST 55    ALT 33    T BILI 0.7    Lab Results  Component Value Date  CKTOTAL 146 02/03/2013   CKMB 27.3 02/03/2013   TROPONINI 5.48 02/03/2013    Lab Results  Component Value Date   CHOL 270* 02/03/2010   HDL 33* 02/03/2010   LDLCALC 176* 02/03/2010   TRIG 304* 02/03/2010   Pro B Natriuretic peptide (BNP)  Date/Time Value Range Status  02/03/2013 475.0* 0 - 125 pg/mL Final     Radiology/Studies: CXR at [facility] showed NAD  ECG: ST, no ST elevation, Q waves in III and AVf are new  ASSESSMENT AND PLAN:  Principal Problem:   Non-ST elevation myocardial infarction (NSTEMI), initial episode of care - cath today. Continue current Rx, with IV BB for BP/HR control if does not cause wheezing. Continue home pain Rx once confirmed by pharmacy and screen for CRF control.  Active Problems:   DIABETES MELLITUS, TYPE II   HYPERLIPIDEMIA   Obesity, unspecified   ANXIETY   HYPERTENSION   COPD  Signed, Rosaria Ferries, PA-C 02/03/2013 3:18 PM Beeper [phone] Patient was seen on the heart unit.  He continues to have some left arm aching discomfort.  His cardiac enzymes at [facility] indicate recent myocardial damage.  It appears that his heart attack may have occurred several days ago and he already has well-developed new Q waves in the inferior leads which were not present in 2012.  The patient has significant risk factors for ischemic heart disease including poorly controlled hypertension and poorly controlled hypercholesterolemia.  He will require aggressive risk factor management.  We have discussed cardiac catheterization with the patient is willing to proceed.  We will plan for cardiac catheterization and possible PCI later  today.  His renal function is normal and he has not taken his metformin today.  Agree with assessment and plan as noted above.

## 2013-02-03 NOTE — Progress Notes (Signed)
To the cath lab by bed. Heparin gtt stopped.

## 2013-02-03 NOTE — Progress Notes (Signed)
Utilization Review Completed.Manuel Knight T1/23/2015  

## 2013-02-03 NOTE — CV Procedure (Signed)
       PROCEDURE:  Left heart catheterization with selective coronary angiography, left ventriculogram.  INDICATIONS:  NSTEMI  The risks, benefits, and details of the procedure were explained to the patient.  The patient verbalized understanding and wanted to proceed.  Informed written consent was obtained.  PROCEDURE TECHNIQUE:  After Xylocaine anesthesia a 41F slender sheath was placed in the right radial artery with a single anterior needle wall stick.   Right coronary angiography was done using a Judkins R4 guide catheter.  Left coronary angiography was done using a Judkins L3.5 guide catheter.  Left ventriculography was done using a pigtail catheter.  A TR band was used for hemostasis.   CONTRAST:  Total of 90 cc.  COMPLICATIONS:  None.    HEMODYNAMICS:  Aortic pressure was 132/86; LV pressure was 134/14; LVEDP 20.  There was no gradient between the left ventricle and aorta.    ANGIOGRAPHIC DATA:   The left main coronary artery is widely patent.  The left anterior descending artery is a large vessel which wraps around the apex. There is mild ostial LAD disease. There is a large branching septal proximally. In the mid LAD, there is a diffuse area of atherosclerosis with up to 80% stenosis. After this diffuse lesion, there is a medium-sized diagonal which is patent. There several smaller diagonals which are patent.  The first diagonal has a high takeoff and has diffuse severe disease.  The left circumflex artery is a large vessel. The first obtuse marginal is a medium size vessel with a diffuse stenosis up to 80%.  M2 is medium-sized and patent.   The right coronary artery is a large dominant vessel. There is moderate proximal disease. In the midportion, there is a focal 90%, thrombotic stenosis. This is followed by a long segment of moderate disease. The posterior descending artery has mild diffuse atherosclerosis and some more severe disease in the mid portion. The posterior lateral  artery branches and in the lower pole, there is a severe, 90% stenosis. 6  LEFT VENTRICULOGRAM:  Left ventricular angiogram was done in the 30 RAO projection and revealed global left ventricular hypokinesis of moderate degree with severe apical hypokinesis. Overall systolic function is moderate to severely decreased with an estimated ejection fraction of 30%.  LVEDP was 20 mmHg.  IMPRESSIONS:  1. Normal left main coronary artery. 2. Severe disease in in the mid  left anterior descending artery, up to 80%.  Significant disease in a medium-sized first diagonal. 3. Patent left circumflex artery. OM1 with 80% stenosis. 4.  Long segment of moderate to severe disease in the proximal to mid right coronary artery, up to 90%. This is likely the culprit for his presentation today.  5.  Decreased left ventricular systolic function.  LVEDP 20  mmHg.  Ejection fraction 30%.  RECOMMENDATION:  Patient has multivessel disease. Per the history, he has not been compliant with his medications. Given that he may not be compliant with dual antiplatelet therapy and he is diabetic, we'll obtain surgical consult.  He also has significantly decreased left ventricular function.  He will stay in the hospital until his bypass surgery.

## 2013-02-03 NOTE — Progress Notes (Signed)
ANTICOAGULATION CONSULT NOTE - Initial Consult  Pharmacy Consult for Heparin Indication: multivessel CAD  Allergies  Allergen Reactions  . Tylenol [Acetaminophen] Hives  . Propoxyphene N-Acetaminophen Other (See Comments)    unknown  . Lodine [Etodolac] Swelling and Rash    Patient Measurements: Height: 5\' 9"  (175.3 cm) Weight: 258 lb 9.6 oz (117.3 kg) IBW/kg (Calculated) : 70.7 Heparin Dosing Weight: 97.1 kg  Vital Signs: Temp: 98.7 F (37.1 C) (01/23 1410) Temp src: Oral (01/23 1410) BP: 145/93 mmHg (01/23 1800) Pulse Rate: 100 (01/23 1800)  Labs: (from MD note) WBC 13.7 H/H 15.3/46.3 Platelets 225  SCr 0.87 No results found for this basename: HGB, HCT, PLT, APTT, LABPROT, INR, HEPARINUNFRC, CREATININE, CKTOTAL, CKMB, TROPONINI,  in the last 72 hours  Estimated Creatinine Clearance: 136.4 ml/min (by C-G formula based on Cr of 0.69).   Medical History: Past Medical History  Diagnosis Date  . Anxiety   . Pulmonary nodule   . Hyperlipidemia   . COPD (chronic obstructive pulmonary disease)   . Chronic back pain   . Obesity   . Diabetes mellitus, type 2   . Hypertension   . Cellulitis 2011    of neck   . GERD (gastroesophageal reflux disease)   . Claustrophobia   . Diverticulitis of colon 8841    complicated by abscess  . Heart attack     Medications:  Prescriptions prior to admission  Medication Sig Dispense Refill  . albuterol (PROVENTIL HFA;VENTOLIN HFA) 108 (90 BASE) MCG/ACT inhaler Inhale 2 puffs into the lungs every 4 (four) hours as needed for wheezing or shortness of breath.      . alprazolam (XANAX) 2 MG tablet Take 2 mg by mouth 4 (four) times daily.      Marland Kitchen amLODipine (NORVASC) 10 MG tablet Take 10 mg by mouth daily.      Marland Kitchen amphetamine-dextroamphetamine (ADDERALL) 20 MG tablet Take 60 mg by mouth daily.      . benazepril (LOTENSIN) 20 MG tablet Take 20 mg by mouth daily.      . Canagliflozin (INVOKANA) 100 MG TABS Take 100 mg by mouth daily.       . cloNIDine (CATAPRES) 0.3 MG tablet Take 0.3 mg by mouth 2 (two) times daily.       . DULoxetine (CYMBALTA) 30 MG capsule Take 30 mg by mouth daily.      Marland Kitchen esomeprazole (NEXIUM) 40 MG capsule Take 40 mg by mouth daily at 12 noon.      . Fluticasone-Salmeterol (ADVAIR DISKUS) 500-50 MCG/DOSE AEPB Inhale 1 puff into the lungs every 12 (twelve) hours.        . gabapentin (NEURONTIN) 300 MG capsule Take 300 mg by mouth 3 (three) times daily.      . insulin aspart (NOVOLOG FLEXPEN) 100 UNIT/ML FlexPen Inject 0-26 Units into the skin 3 (three) times daily with meals. 20 units three times a day with sliding scale max of 26 units      . insulin detemir (LEVEMIR) 100 UNIT/ML injection Inject 80 Units into the skin at bedtime.      . meloxicam (MOBIC) 15 MG tablet Take 15 mg by mouth daily.      . metFORMIN (GLUCOPHAGE) 1000 MG tablet Take 1,000 mg by mouth 2 (two) times daily with a meal.      . Oxycodone HCl 10 MG TABS Take 10 mg by mouth 2 (two) times daily.      . pantoprazole (PROTONIX) 40 MG tablet Take 40 mg  by mouth daily.        Marland Kitchen tiotropium (SPIRIVA) 18 MCG inhalation capsule Place 18 mcg into inhaler and inhale daily.          Assessment: 53 y/o male with a hx of CAD to resume heparin post cath 8 hours after sheath removal for multivessel CAD. CABG is planned. His sheath was removed at 16:47. No bleeding noted, CBC is normal from MD note. Patient is refusing labs per RN note.  Goal of Therapy:  Heparin level 0.3-0.7 units/ml Monitor platelets by anticoagulation protocol: Yes   Plan:  -Heparin drip at 1400 units/hr to begin at 00:45 on 1/24 -6 hr heparin level -Daily heparin level and CBC -Discontinue heparin SQ -Monitor for s/sx of bleeding  [facility], [place].D., BCPS Clinical Pharmacist Pager: [phone] 02/03/2013 6:41 PM

## 2013-02-04 ENCOUNTER — Encounter (HOSPITAL_COMMUNITY): Payer: Self-pay | Admitting: *Deleted

## 2013-02-04 DIAGNOSIS — I10 Essential (primary) hypertension: Secondary | ICD-10-CM

## 2013-02-04 DIAGNOSIS — I2510 Atherosclerotic heart disease of native coronary artery without angina pectoris: Secondary | ICD-10-CM

## 2013-02-04 DIAGNOSIS — I214 Non-ST elevation (NSTEMI) myocardial infarction: Secondary | ICD-10-CM

## 2013-02-04 DIAGNOSIS — J449 Chronic obstructive pulmonary disease, unspecified: Secondary | ICD-10-CM

## 2013-02-04 DIAGNOSIS — R109 Unspecified abdominal pain: Secondary | ICD-10-CM

## 2013-02-04 LAB — BASIC METABOLIC PANEL
BUN: 26 mg/dL — AB (ref 6–23)
CALCIUM: 8.7 mg/dL (ref 8.4–10.5)
CHLORIDE: 97 meq/L (ref 96–112)
CO2: 24 mEq/L (ref 19–32)
CREATININE: 1.8 mg/dL — AB (ref 0.50–1.35)
GFR calc non Af Amer: 42 mL/min — ABNORMAL LOW (ref >90)
GFR, EST AFRICAN AMERICAN: 48 mL/min — AB (ref >90)
Glucose, Bld: 281 mg/dL — ABNORMAL HIGH (ref 70–99)
Potassium: 3.8 mEq/L (ref 3.7–5.3)
Sodium: 136 mEq/L — ABNORMAL LOW (ref 137–147)

## 2013-02-04 LAB — COMPREHENSIVE METABOLIC PANEL
ALT: 24 U/L (ref 0–53)
AST: 37 U/L (ref 0–37)
Albumin: 2.9 g/dL — ABNORMAL LOW (ref 3.5–5.2)
Alkaline Phosphatase: 59 U/L (ref 39–117)
BUN: 19 mg/dL (ref 6–23)
CALCIUM: 8.6 mg/dL (ref 8.4–10.5)
CO2: 24 meq/L (ref 19–32)
CREATININE: 2.13 mg/dL — AB (ref 0.50–1.35)
Chloride: 98 mEq/L (ref 96–112)
GFR calc Af Amer: 39 mL/min — ABNORMAL LOW (ref >90)
GFR calc non Af Amer: 34 mL/min — ABNORMAL LOW (ref >90)
Glucose, Bld: 295 mg/dL — ABNORMAL HIGH (ref 70–99)
Potassium: 4.3 mEq/L (ref 3.7–5.3)
Sodium: 136 mEq/L — ABNORMAL LOW (ref 137–147)
Total Bilirubin: 0.5 mg/dL (ref 0.3–1.2)
Total Protein: 6 g/dL (ref 6.0–8.3)

## 2013-02-04 LAB — LIPID PANEL
Cholesterol: 205 mg/dL — ABNORMAL HIGH (ref 0–200)
HDL: 26 mg/dL — AB (ref >39)
LDL CALC: 133 mg/dL — AB (ref 0–99)
TRIGLYCERIDES: 229 mg/dL — AB (ref <150)
Total CHOL/HDL Ratio: 7.9 RATIO
VLDL: 46 mg/dL — ABNORMAL HIGH (ref 0–40)

## 2013-02-04 LAB — GLUCOSE, CAPILLARY
GLUCOSE-CAPILLARY: 336 mg/dL — AB (ref 70–99)
Glucose-Capillary: 285 mg/dL — ABNORMAL HIGH (ref 70–99)
Glucose-Capillary: 295 mg/dL — ABNORMAL HIGH (ref 70–99)
Glucose-Capillary: 258 mg/dL — ABNORMAL HIGH (ref 70–99)

## 2013-02-04 LAB — URIC ACID: Uric Acid, Serum: 5.9 mg/dL (ref 4.0–7.8)

## 2013-02-04 LAB — TROPONIN I: Troponin I: 3.91 ng/mL (ref <0.30)

## 2013-02-04 LAB — CBC
HEMATOCRIT: 40.3 % (ref 39.0–52.0)
Hemoglobin: 13.5 g/dL (ref 13.0–17.0)
MCH: 30.3 pg (ref 26.0–34.0)
MCHC: 33.5 g/dL (ref 30.0–36.0)
MCV: 90.4 fL (ref 78.0–100.0)
Platelets: 233 10*3/uL (ref 150–400)
RBC: 4.46 MIL/uL (ref 4.22–5.81)
RDW: 13.7 % (ref 11.5–15.5)
WBC: 11 10*3/uL — ABNORMAL HIGH (ref 4.0–10.5)

## 2013-02-04 LAB — HEPARIN LEVEL (UNFRACTIONATED)
Heparin Unfractionated: <0.1 IU/mL — ABNORMAL LOW (ref 0.30–0.70)
Heparin Unfractionated: 0.14 IU/mL — ABNORMAL LOW (ref 0.30–0.70)

## 2013-02-04 MED ORDER — AMLODIPINE BESYLATE 5 MG PO TABS
5.0000 mg | ORAL_TABLET | Freq: Every day | ORAL | Status: DC
  Administered 2013-02-05: 5 mg via ORAL
  Filled 2013-02-04 (×2): qty 1

## 2013-02-04 MED ORDER — HEPARIN (PORCINE) IN NACL 100-0.45 UNIT/ML-% IJ SOLN
2000.0000 [IU]/h | INTRAMUSCULAR | Status: DC
  Administered 2013-02-05: 2000 [IU]/h via INTRAVENOUS
  Filled 2013-02-04 (×7): qty 250

## 2013-02-04 MED ORDER — CLONIDINE HCL 0.1 MG PO TABS
0.1000 mg | ORAL_TABLET | Freq: Every day | ORAL | Status: DC
  Administered 2013-02-05: 0.1 mg via ORAL
  Filled 2013-02-04 (×2): qty 1

## 2013-02-04 MED ORDER — HEPARIN BOLUS VIA INFUSION
2000.0000 [IU] | Freq: Once | INTRAVENOUS | Status: AC
  Administered 2013-02-04: 2000 [IU] via INTRAVENOUS
  Filled 2013-02-04: qty 2000

## 2013-02-04 MED ORDER — METOPROLOL TARTRATE 25 MG PO TABS
25.0000 mg | ORAL_TABLET | Freq: Three times a day (TID) | ORAL | Status: DC
Start: 2013-02-04 — End: 2013-02-04

## 2013-02-04 MED ORDER — ALBUTEROL SULFATE (2.5 MG/3ML) 0.083% IN NEBU
2.5000 mg | INHALATION_SOLUTION | Freq: Two times a day (BID) | RESPIRATORY_TRACT | Status: DC
  Administered 2013-02-04 – 2013-02-05 (×4): 2.5 mg via RESPIRATORY_TRACT
  Filled 2013-02-04 (×4): qty 3

## 2013-02-04 MED ORDER — METOPROLOL TARTRATE 12.5 MG HALF TABLET
12.5000 mg | ORAL_TABLET | Freq: Three times a day (TID) | ORAL | Status: DC
  Administered 2013-02-04 – 2013-02-05 (×5): 12.5 mg via ORAL
  Filled 2013-02-04 (×8): qty 1

## 2013-02-04 MED ORDER — LEVALBUTEROL HCL 0.63 MG/3ML IN NEBU
0.6300 mg | INHALATION_SOLUTION | Freq: Two times a day (BID) | RESPIRATORY_TRACT | Status: DC
Start: 2013-02-04 — End: 2013-02-04

## 2013-02-04 MED ORDER — BENAZEPRIL HCL 10 MG PO TABS: 10.0000 mg | ORAL_TABLET | Freq: Every day | ORAL | Status: DC

## 2013-02-04 MED ORDER — ALPRAZOLAM 0.5 MG PO TABS
1.0000 mg | ORAL_TABLET | Freq: Four times a day (QID) | ORAL | Status: DC
  Administered 2013-02-04 – 2013-02-05 (×5): 1 mg via ORAL
  Filled 2013-02-04 (×4): qty 2

## 2013-02-04 NOTE — Progress Notes (Signed)
ANTICOAGULATION CONSULT NOTE - Follow Up Consult  Pharmacy Consult for heparin Indication: mulivessel CAD  Allergies  Allergen Reactions  . Tylenol [Acetaminophen] Hives  . Propoxyphene N-Acetaminophen Other (See Comments)    unknown  . Lodine [Etodolac] Swelling and Rash    Patient Measurements: Height: 5\' 9"  (175.3 cm) Weight: 262 lb 2 oz (118.9 kg) IBW/kg (Calculated) : 70.7 Heparin Dosing Weight: 97.1 kg  Vital Signs: Temp: 98.4 F (36.9 C) (01/24 2000) Temp src: Oral (01/24 2000) BP: 88/56 mmHg (01/24 2010) Pulse Rate: 83 (01/24 2010)  Labs:  Recent Labs  02/03/13 2145 02/04/13 0314 02/04/13 0900 02/04/13 1420 02/04/13 1848  HGB  --  13.5  --   --   --   HCT  --  40.3  --   --   --   PLT  --  233  --   --   --   HEPARINUNFRC  --   --  <0.10*  --  0.14*  CREATININE  --  2.13*  --  1.80*  --   TROPONINI 6.40* 3.91*  --   --   --     Estimated Creatinine Clearance: 61.1 ml/min (by C-G formula based on Cr of 1.8).   Medications:  Scheduled:  . albuterol  2.5 mg Nebulization BID  . alprazolam  1 mg Oral QID  . [START ON 02/05/2013] amLODipine  5 mg Oral Daily  . amphetamine-dextroamphetamine  60 mg Oral Daily  . aspirin  81 mg Oral Daily  . atorvastatin  80 mg Oral q1800  . Canagliflozin  100 mg Oral Daily  . [START ON 02/05/2013] cloNIDine  0.1 mg Oral Daily  . DULoxetine  30 mg Oral Daily  . famotidine  10 mg Oral QHS  . gabapentin  300 mg Oral TID  . insulin aspart  0-15 Units Subcutaneous TID WC  . insulin detemir  80 Units Subcutaneous QHS  . meloxicam  15 mg Oral Daily  . metoprolol tartrate  12.5 mg Oral TID  . mometasone-formoterol  2 puff Inhalation BID  . oxyCODONE  10 mg Oral BID  . pantoprazole  40 mg Oral Daily  . sodium chloride  3 mL Intravenous Q12H  . tiotropium  18 mcg Inhalation Daily   Infusions:  . heparin 1,800 Units/hr (02/04/13 1301)  . nitroGLYCERIN Stopped (02/03/13 2330)    Assessment: 53 y/o male (transferred from  [place]) with a hx of CAD s/p cath resumed on heparin for multivessel CAD. CABG is planned for Monday pending improvement in renal function.  Heparin level is sub-therapeutic at 0.14.  SCr down today at 1.80.   Goal of Therapy:  Heparin level 0.3-0.7 units/ml Monitor platelets by anticoagulation protocol: Yes   Plan:  - Small bolus 2000 units.  - Increase heparin to 2000 units/hr.  - draw 6h HL - ok to do with am labs.  - daily HL, CBC - monitor for s/s of bleeding  Sloan Leiter, PharmD, BCPS Clinical Pharmacist [phone]  02/04/2013 8:31 PM

## 2013-02-04 NOTE — Progress Notes (Signed)
Cardiac Rehab Phase I  Reviewed pre-op education with pt since we are not here tomorrow.  Reviewed surgery booklet and info with pt.  Asked him to watch pre-op video.  We will continue to follow pt on Monday. Alberteen Sam, MA, ACSM RCEP

## 2013-02-04 NOTE — Progress Notes (Addendum)
Subjective: No CP  Breathing is a little short  Complains of L arm pain  Elbow pain  Worse with touching  No fall.     Objective: Filed Vitals:   02/04/13 0500 02/04/13 0738 02/04/13 0946 02/04/13 1118  BP:  101/56  99/68  Pulse:  90  76  Temp:  98.6 F (37 C)  97 F (36.1 C)  TempSrc:  Oral  Oral  Resp:  20  22  Height:      Weight: 262 lb 2 oz (118.9 kg)     SpO2:  97% 98% 98%   Weight change:   Intake/Output Summary (Last 24 hours) at 02/04/13 1202 Last data filed at 02/04/13 0500  Gross per 24 hour  Intake 828.07 ml  Output    725 ml  Net 103.07 ml    General: [place], oriented x3, in no acute distress  Patient appears a little sleepy Neck:  JVP is difficult to assess Heart: Regular rate and rhythm, without murmurs, rubs, gallops.  Lungs: Decreased air flow  Rhonchi Exemities:  Tr edema.   Neuro: Grossly intact, nonfocal.  Tel:  SR   Lab Results: Results for orders placed during the hospital encounter of 02/03/13 (from the past 24 hour(s))  GLUCOSE, CAPILLARY     Status: Abnormal   Collection Time    02/03/13  2:16 PM      Result Value Range   Glucose-Capillary 292 (*) 70 - 99 mg/dL  MRSA PCR SCREENING     Status: None   Collection Time    02/03/13  2:20 PM      Result Value Range   MRSA by PCR NEGATIVE  NEGATIVE  GLUCOSE, CAPILLARY     Status: Abnormal   Collection Time    02/03/13  5:14 PM      Result Value Range   Glucose-Capillary 241 (*) 70 - 99 mg/dL  GLUCOSE, CAPILLARY     Status: Abnormal   Collection Time    02/03/13  9:24 PM      Result Value Range   Glucose-Capillary 383 (*) 70 - 99 mg/dL  TROPONIN I     Status: Abnormal   Collection Time    02/03/13  9:45 PM      Result Value Range   Troponin I 6.40 (*) <0.30 ng/mL  TROPONIN I     Status: Abnormal   Collection Time    02/04/13  3:14 AM      Result Value Range   Troponin I 3.91 (*) <0.30 ng/mL  COMPREHENSIVE METABOLIC PANEL     Status: Abnormal   Collection Time    02/04/13  3:14 AM       Result Value Range   Sodium 136 (*) 137 - 147 mEq/L   Potassium 4.3  3.7 - 5.3 mEq/L   Chloride 98  96 - 112 mEq/L   CO2 24  19 - 32 mEq/L   Glucose, Bld 295 (*) 70 - 99 mg/dL   BUN 19  6 - 23 mg/dL   Creatinine, Ser 2.13 (*) 0.50 - 1.35 mg/dL   Calcium 8.6  8.4 - 10.5 mg/dL   Total Protein 6.0  6.0 - 8.3 g/dL   Albumin 2.9 (*) 3.5 - 5.2 g/dL   AST 37  0 - 37 U/L   ALT 24  0 - 53 U/L   Alkaline Phosphatase 59  39 - 117 U/L   Total Bilirubin 0.5  0.3 - 1.2 mg/dL   GFR calc non  Af Amer 34 (*) >90 mL/min   GFR calc Af Amer 39 (*) >90 mL/min  LIPID PANEL     Status: Abnormal   Collection Time    02/04/13  3:14 AM      Result Value Range   Cholesterol 205 (*) 0 - 200 mg/dL   Triglycerides 229 (*) <150 mg/dL   HDL 26 (*) >39 mg/dL   Total CHOL/HDL Ratio 7.9     VLDL 46 (*) 0 - 40 mg/dL   LDL Cholesterol 133 (*) 0 - 99 mg/dL  CBC     Status: Abnormal   Collection Time    02/04/13  3:14 AM      Result Value Range   WBC 11.0 (*) 4.0 - 10.5 K/uL   RBC 4.46  4.22 - 5.81 MIL/uL   Hemoglobin 13.5  13.0 - 17.0 g/dL   HCT 40.3  39.0 - 52.0 %   MCV 90.4  78.0 - 100.0 fL   MCH 30.3  26.0 - 34.0 pg   MCHC 33.5  30.0 - 36.0 g/dL   RDW 13.7  11.5 - 15.5 %   Platelets 233  150 - 400 K/uL  GLUCOSE, CAPILLARY     Status: Abnormal   Collection Time    02/04/13  7:40 AM      Result Value Range   Glucose-Capillary 285 (*) 70 - 99 mg/dL  HEPARIN LEVEL (UNFRACTIONATED)     Status: Abnormal   Collection Time    02/04/13  9:00 AM      Result Value Range   Heparin Unfractionated <0.10 (*) 0.30 - 0.70 IU/mL    Studies/Results: @RISRSLT24 @  Medications: Reviewed   @PROBHOSP @  1.  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  COPD  Significant  3.  HTN BP was low this am  On a lot of meds  Amlodipine held  I would cut back on this to 5 qd  Hold benazapril with renal dysfunction.  Cut  back on clonidine to 0.1 1x per day Add lopressor. The patinet is on Xanx 2 mg qid  Also on adderrall!!!!  Also on Oxycodone which could effect.  Will taper xanax to 1 mg qid  Follow    3..  Renal  Signif bump in Cr  Will stop ACE I  Check in now and in AM  If still going up will give IV fluids.    4.  HL  On lipitor 80.  5.  Elbow  Will check URic acid   LOS: 1 day   Dorris Carnes 02/04/2013, 12:02 PM

## 2013-02-04 NOTE — Progress Notes (Signed)
ANTICOAGULATION CONSULT NOTE - Follow Up Consult  Pharmacy Consult for heparin gtt Indication: mulivessel CAD  Allergies  Allergen Reactions  . Tylenol [Acetaminophen] Hives  . Propoxyphene N-Acetaminophen Other (See Comments)    unknown  . Lodine [Etodolac] Swelling and Rash    Patient Measurements: Height: 5\' 9"  (175.3 cm) Weight: 262 lb 2 oz (118.9 kg) IBW/kg (Calculated) : 70.7 Heparin Dosing Weight: 97.1 kg  Vital Signs: Temp: 97 F (36.1 C) (01/24 1118) Temp src: Oral (01/24 1118) BP: 99/68 mmHg (01/24 1118) Pulse Rate: 76 (01/24 1118)  Labs:  Recent Labs  02/03/13 2145 02/04/13 0314 02/04/13 0900  HGB  --  13.5  --   HCT  --  40.3  --   PLT  --  233  --   HEPARINUNFRC  --   --  <0.10*  CREATININE  --  2.13*  --   TROPONINI 6.40* 3.91*  --     Estimated Creatinine Clearance: 51.6 ml/min (by C-G formula based on Cr of 2.13).   Medications:  Scheduled:  . albuterol  2.5 mg Nebulization BID  . alprazolam  2 mg Oral QID  . amLODipine  10 mg Oral Daily  . amphetamine-dextroamphetamine  60 mg Oral Daily  . aspirin  81 mg Oral Daily  . atorvastatin  80 mg Oral q1800  . benazepril  20 mg Oral Daily  . Canagliflozin  100 mg Oral Daily  . cloNIDine  0.3 mg Oral BID  . DULoxetine  30 mg Oral Daily  . famotidine  10 mg Oral QHS  . gabapentin  300 mg Oral TID  . insulin aspart  0-15 Units Subcutaneous TID WC  . insulin detemir  80 Units Subcutaneous QHS  . meloxicam  15 mg Oral Daily  . [START ON 02/05/2013] metFORMIN  1,000 mg Oral BID WC  . mometasone-formoterol  2 puff Inhalation BID  . oxyCODONE  10 mg Oral BID  . pantoprazole  40 mg Oral Daily  . sodium chloride  3 mL Intravenous Q12H  . tiotropium  18 mcg Inhalation Daily   Infusions:  . heparin 1,400 Units/hr (02/04/13 0600)  . nitroGLYCERIN Stopped (02/03/13 2330)    Assessment: 53 y/o male (transferred from [facility]) with a hx of CAD s/p resuming heparin after cath procedure at [facility] (8 hours  after sheath removal) for multivessel CAD. Likely CABG is planned, awaiting surgical consult. Heparin gtt was started at 1400 units/hr and the f/u heparin level has returned undetectable. CBC is wnl, and SCr is 2.13 (significantly up from [facility] SCr 0.87 on 1/23).  I visited the patient's room and confirmed the gtt running at reported dose.  Nurse reports no issues with line access and no interruptions in therapy.  No bleeding issues have been noted.  With consideration of changing renal function balanced with undetectable level, will increase dose but no bolus.    Goal of Therapy:  Heparin level 0.3-0.7 units/ml Monitor platelets by anticoagulation protocol: Yes   Plan:  - increase heparin gtt to 1800 units/hr, no bolus - draw 6h HL - daily HL, CBC - monitor for s/s of bleeding - monitor kidney function, discussed jump in SCr w/ Dr. Harrington Challenger - patient to get fluids and stop ACEI per Cardiology note  Renelda Mom. Jacqlyn Larsen, PharmD Clinical Pharmacist - Resident Pager: [phone] Pharmacy: [phone] 02/04/2013 12:19 PM

## 2013-02-04 NOTE — Progress Notes (Signed)
I agree with the assessment documented by Fredderick Severance, Stage manager.

## 2013-02-04 NOTE — Consult Note (Signed)
[address]       [place] [address]             [phone]       Reason for Consult: Severe multi-vessel CAD s/p NSTEMI. Referring Physician: Dr. Casandra Doffing  Manuel Knight is an 53 y.o. male.  HPI:   The patient is a 53 year old gentleman with uncontrolled DM due to noncompliance, hyperlipidemia, hypertension, COPD, chronic anxiety and a hx of coronary disease s/p MI in the past. He reports developing left arm pain in church last Sunday that waxed and waned and then on Wednesday it became continuous and severe prompting a visit to [facility] ER. His ECG was abnormal and troponin was elevated so he was transferred to [facility] for further workup. He underwent cath yesterday that shows severe 3-vessel CAD with the culprit appearing to be a 90% mid RCA stenosis that looks thrombotic. His EF was 30%. He has continued to have the left arm pain particularly in his elbow, but also in the forearm and upper arm. He says his left arm has been so weak that he can barely use it.  Past Medical History  Diagnosis Date  . Anxiety   . Pulmonary nodule   . Hyperlipidemia   . COPD (chronic obstructive pulmonary disease)   . Chronic back pain   . Obesity   . Diabetes mellitus, type 2   . Hypertension   . Cellulitis 2011    of neck   . GERD (gastroesophageal reflux disease)   . Claustrophobia   . Diverticulitis of colon 4270    complicated by abscess  . Heart attack     Past Surgical History  Procedure Laterality Date  . Appendectomy  2007    gangrenous appendicitis   . Tonsillectomy      Family History  Problem Relation Age of Onset  . Heart attack Father   . Diabetes Sister   . Coronary artery disease Sister   . Heart attack Brother   . Colon cancer Neg Hx   . Cancer Mother     "blood transplant"  . Coronary artery disease Mother     Social History:  reports that he quit smoking about 25 years ago. His smoking use included Cigarettes. He smoked 1.50 packs  per day. He has never used smokeless tobacco. He reports that he does not drink alcohol or use illicit drugs. He reports smoking marijuana since he was a kid but none in a few years.  Allergies:  Allergies  Allergen Reactions  . Tylenol [Acetaminophen] Hives  . Propoxyphene N-Acetaminophen Other (See Comments)    unknown  . Lodine [Etodolac] Swelling and Rash    Medications:  I have reviewed the patient's current medications. Prior to Admission:  Prescriptions prior to admission  Medication Sig Dispense Refill  . albuterol (PROVENTIL HFA;VENTOLIN HFA) 108 (90 BASE) MCG/ACT inhaler Inhale 2 puffs into the lungs every 4 (four) hours as needed for wheezing or shortness of breath.      . alprazolam (XANAX) 2 MG tablet Take 2 mg by mouth 4 (four) times daily.      Marland Kitchen amLODipine (NORVASC) 10 MG tablet Take 10 mg by mouth daily.      Marland Kitchen amphetamine-dextroamphetamine (ADDERALL) 20 MG tablet Take 60 mg by mouth daily.      . benazepril (LOTENSIN) 20 MG tablet Take 20 mg by mouth daily.      . Canagliflozin (INVOKANA) 100 MG TABS Take  100 mg by mouth daily.      . cloNIDine (CATAPRES) 0.3 MG tablet Take 0.3 mg by mouth 2 (two) times daily.       . DULoxetine (CYMBALTA) 30 MG capsule Take 30 mg by mouth daily.      Marland Kitchen esomeprazole (NEXIUM) 40 MG capsule Take 40 mg by mouth daily at 12 noon.      . Fluticasone-Salmeterol (ADVAIR DISKUS) 500-50 MCG/DOSE AEPB Inhale 1 puff into the lungs every 12 (twelve) hours.        . gabapentin (NEURONTIN) 300 MG capsule Take 300 mg by mouth 3 (three) times daily.      . insulin aspart (NOVOLOG FLEXPEN) 100 UNIT/ML FlexPen Inject 0-26 Units into the skin 3 (three) times daily with meals. 20 units three times a day with sliding scale max of 26 units      . insulin detemir (LEVEMIR) 100 UNIT/ML injection Inject 80 Units into the skin at bedtime.      . meloxicam (MOBIC) 15 MG tablet Take 15 mg by mouth daily.      . metFORMIN (GLUCOPHAGE) 1000 MG tablet Take 1,000 mg  by mouth 2 (two) times daily with a meal.      . Oxycodone HCl 10 MG TABS Take 10 mg by mouth 2 (two) times daily.      . pantoprazole (PROTONIX) 40 MG tablet Take 40 mg by mouth daily.        Marland Kitchen tiotropium (SPIRIVA) 18 MCG inhalation capsule Place 18 mcg into inhaler and inhale daily.         Scheduled: . albuterol  2.5 mg Nebulization BID  . alprazolam  1 mg Oral QID  . [START ON 02/05/2013] amLODipine  5 mg Oral Daily  . amphetamine-dextroamphetamine  60 mg Oral Daily  . aspirin  81 mg Oral Daily  . atorvastatin  80 mg Oral q1800  . Canagliflozin  100 mg Oral Daily  . [START ON 02/05/2013] cloNIDine  0.1 mg Oral Daily  . DULoxetine  30 mg Oral Daily  . famotidine  10 mg Oral QHS  . gabapentin  300 mg Oral TID  . insulin aspart  0-15 Units Subcutaneous TID WC  . insulin detemir  80 Units Subcutaneous QHS  . meloxicam  15 mg Oral Daily  . metoprolol tartrate  12.5 mg Oral TID  . mometasone-formoterol  2 puff Inhalation BID  . oxyCODONE  10 mg Oral BID  . pantoprazole  40 mg Oral Daily  . sodium chloride  3 mL Intravenous Q12H  . tiotropium  18 mcg Inhalation Daily   Continuous: . heparin 1,800 Units/hr (02/04/13 1301)  . nitroGLYCERIN Stopped (02/03/13 2330)   ZOX:WRUEAV chloride, acetaminophen, albuterol, ALPRAZolam, metoprolol, nitroGLYCERIN, ondansetron (ZOFRAN) IV, ondansetron (ZOFRAN) IV, oxyCODONE, sodium chloride Anti-infectives   None      Results for orders placed during the hospital encounter of 02/03/13 (from the past 48 hour(s))  GLUCOSE, CAPILLARY     Status: Abnormal   Collection Time    02/03/13  2:16 PM      Result Value Range   Glucose-Capillary 292 (*) 70 - 99 mg/dL  MRSA PCR SCREENING     Status: None   Collection Time    02/03/13  2:20 PM      Result Value Range   MRSA by PCR NEGATIVE  NEGATIVE   Comment:            The GeneXpert MRSA Assay (FDA     approved for  NASAL specimens     only), is one component of a     comprehensive MRSA colonization       surveillance program. It is not     intended to diagnose MRSA     infection nor to guide or     monitor treatment for     MRSA infections.  GLUCOSE, CAPILLARY     Status: Abnormal   Collection Time    02/03/13  5:14 PM      Result Value Range   Glucose-Capillary 241 (*) 70 - 99 mg/dL  GLUCOSE, CAPILLARY     Status: Abnormal   Collection Time    02/03/13  9:24 PM      Result Value Range   Glucose-Capillary 383 (*) 70 - 99 mg/dL  TROPONIN I     Status: Abnormal   Collection Time    02/03/13  9:45 PM      Result Value Range   Troponin I 6.40 (*) <0.30 ng/mL   Comment:            Due to the release kinetics of cTnI,     a negative result within the first hours     of the onset of symptoms does not rule out     myocardial infarction with certainty.     If myocardial infarction is still suspected,     repeat the test at appropriate intervals.     CRITICAL RESULT CALLED TO, READ BACK BY AND VERIFIED WITH:     DAYS T,RN 02/03/13 2246 WAYK  TROPONIN I     Status: Abnormal   Collection Time    02/04/13  3:14 AM      Result Value Range   Troponin I 3.91 (*) <0.30 ng/mL   Comment:            Due to the release kinetics of cTnI,     a negative result within the first hours     of the onset of symptoms does not rule out     myocardial infarction with certainty.     If myocardial infarction is still suspected,     repeat the test at appropriate intervals.     CRITICAL VALUE NOTED.  VALUE IS CONSISTENT WITH PREVIOUSLY REPORTED AND CALLED VALUE.  COMPREHENSIVE METABOLIC PANEL     Status: Abnormal   Collection Time    02/04/13  3:14 AM      Result Value Range   Sodium 136 (*) 137 - 147 mEq/L   Potassium 4.3  3.7 - 5.3 mEq/L   Chloride 98  96 - 112 mEq/L   CO2 24  19 - 32 mEq/L   Glucose, Bld 295 (*) 70 - 99 mg/dL   BUN 19  6 - 23 mg/dL   Creatinine, Ser 2.13 (*) 0.50 - 1.35 mg/dL   Calcium 8.6  8.4 - 10.5 mg/dL   Total Protein 6.0  6.0 - 8.3 g/dL   Albumin 2.9 (*) 3.5 - 5.2  g/dL   AST 37  0 - 37 U/L   ALT 24  0 - 53 U/L   Alkaline Phosphatase 59  39 - 117 U/L   Total Bilirubin 0.5  0.3 - 1.2 mg/dL   GFR calc non Af Amer 34 (*) >90 mL/min   GFR calc Af Amer 39 (*) >90 mL/min   Comment: (NOTE)     The eGFR has been calculated using the CKD EPI equation.     This calculation has not  been validated in all clinical situations.     eGFR's persistently <90 mL/min signify possible Chronic Kidney     Disease.  LIPID PANEL     Status: Abnormal   Collection Time    02/04/13  3:14 AM      Result Value Range   Cholesterol 205 (*) 0 - 200 mg/dL   Triglycerides 229 (*) <150 mg/dL   HDL 26 (*) >39 mg/dL   Total CHOL/HDL Ratio 7.9     VLDL 46 (*) 0 - 40 mg/dL   LDL Cholesterol 133 (*) 0 - 99 mg/dL   Comment:            Total Cholesterol/HDL:CHD Risk     Coronary Heart Disease Risk Table                         Men   Women      1/2 Average Risk   3.4   3.3      Average Risk       5.0   4.4      2 X Average Risk   9.6   7.1      3 X Average Risk  23.4   11.0                Use the calculated Patient Ratio     above and the CHD Risk Table     to determine the patient's CHD Risk.                ATP III CLASSIFICATION (LDL):      <100     mg/dL   Optimal      100-129  mg/dL   Near or Above                        Optimal      130-159  mg/dL   Borderline      160-189  mg/dL   High      >190     mg/dL   Very High  CBC     Status: Abnormal   Collection Time    02/04/13  3:14 AM      Result Value Range   WBC 11.0 (*) 4.0 - 10.5 K/uL   RBC 4.46  4.22 - 5.81 MIL/uL   Hemoglobin 13.5  13.0 - 17.0 g/dL   HCT 40.3  39.0 - 52.0 %   MCV 90.4  78.0 - 100.0 fL   MCH 30.3  26.0 - 34.0 pg   MCHC 33.5  30.0 - 36.0 g/dL   RDW 13.7  11.5 - 15.5 %   Platelets 233  150 - 400 K/uL  GLUCOSE, CAPILLARY     Status: Abnormal   Collection Time    02/04/13  7:40 AM      Result Value Range   Glucose-Capillary 285 (*) 70 - 99 mg/dL  HEPARIN LEVEL (UNFRACTIONATED)     Status:  Abnormal   Collection Time    02/04/13  9:00 AM      Result Value Range   Heparin Unfractionated <0.10 (*) 0.30 - 0.70 IU/mL   Comment:            IF HEPARIN RESULTS ARE BELOW     EXPECTED VALUES, AND PATIENT     DOSAGE HAS BEEN CONFIRMED,     SUGGEST FOLLOW UP TESTING     OF ANTITHROMBIN III LEVELS.  GLUCOSE,  CAPILLARY     Status: Abnormal   Collection Time    02/04/13 11:25 AM      Result Value Range   Glucose-Capillary 295 (*) 70 - 99 mg/dL    No results found.  Review of Systems  Constitutional: Positive for malaise/fatigue. Negative for fever, chills and weight loss.  HENT: Negative.   Eyes: Negative.   Respiratory: Positive for cough. Negative for sputum production and shortness of breath.   Cardiovascular: Negative for chest pain, palpitations, orthopnea, leg swelling and PND.  Gastrointestinal: Negative.   Musculoskeletal:       Pain in left arm as noted above.  Chronic severe chest pain with coughing.  Skin: Negative.   Neurological: Negative.   Endo/Heme/Allergies: Negative.   Psychiatric/Behavioral: The patient is nervous/anxious.    Blood pressure 110/73, pulse 76, temperature 97 F (36.1 C), temperature source Oral, resp. rate 22, height '5\' 9"'  (1.753 m), weight 118.9 kg (262 lb 2 oz), SpO2 98.00%. Physical Exam  Constitutional: He is oriented to person, place, and time. He appears well-developed and well-nourished. No distress.  HENT:  Head: Normocephalic and atraumatic.  Mouth/Throat: Oropharynx is clear and moist.  Eyes: EOM are normal. Pupils are equal, round, and reactive to light.  Neck: Normal range of motion. Neck supple. No JVD present. No thyromegaly present.  Cardiovascular: Normal rate, regular rhythm, normal heart sounds and intact distal pulses.  Exam reveals no gallop and no friction rub.   No murmur heard. Respiratory: Effort normal and breath sounds normal. No respiratory distress. He has no rales.  GI: Soft. Bowel sounds are normal. He  exhibits no distension and no mass. There is no tenderness.  Musculoskeletal: Normal range of motion. He exhibits no edema.  Lymphadenopathy:    He has no cervical adenopathy.  Neurological: He is alert and oriented to person, place, and time. No cranial nerve deficit.  Skin: Skin is warm and dry.  Psychiatric: Thought content normal.  Flat affect.   PROCEDURE: Left heart catheterization with selective coronary angiography, left ventriculogram.  INDICATIONS: NSTEMI  The risks, benefits, and details of the procedure were explained to the patient. The patient verbalized understanding and wanted to proceed. Informed written consent was obtained.  PROCEDURE TECHNIQUE: After Xylocaine anesthesia a 47F slender sheath was placed in the right radial artery with a single anterior needle wall stick. Right coronary angiography was done using a Judkins R4 guide catheter. Left coronary angiography was done using a Judkins L3.5 guide catheter. Left ventriculography was done using a pigtail catheter. A TR band was used for hemostasis.  CONTRAST: Total of 90 cc.  COMPLICATIONS: None.  HEMODYNAMICS: Aortic pressure was 132/86; LV pressure was 134/14; LVEDP 20. There was no gradient between the left ventricle and aorta.  ANGIOGRAPHIC DATA: The left main coronary artery is widely patent.  The left anterior descending artery is a large vessel which wraps around the apex. There is mild ostial LAD disease. There is a large branching septal proximally. In the mid LAD, there is a diffuse area of atherosclerosis with up to 80% stenosis. After this diffuse lesion, there is a medium-sized diagonal which is patent. There several smaller diagonals which are patent. The first diagonal has a high takeoff and has diffuse severe disease.  The left circumflex artery is a large vessel. The first obtuse marginal is a medium size vessel with a diffuse stenosis up to 80%. M2 is medium-sized and patent.  The right coronary artery is a  large dominant vessel. There  is moderate proximal disease. In the midportion, there is a focal 90%, thrombotic stenosis. This is followed by a long segment of moderate disease. The posterior descending artery has mild diffuse atherosclerosis and some more severe disease in the mid portion. The posterior lateral artery branches and in the lower pole, there is a severe, 90% stenosis. 6  LEFT VENTRICULOGRAM: Left ventricular angiogram was done in the 30 RAO projection and revealed global left ventricular hypokinesis of moderate degree with severe apical hypokinesis. Overall systolic function is moderate to severely decreased with an estimated ejection fraction of 30%. LVEDP was 20 mmHg.  IMPRESSIONS:  1. Normal left main coronary artery. 2. Severe disease in in the mid left anterior descending artery, up to 80%. Significant disease in a medium-sized first diagonal. 3. Patent left circumflex artery. OM1 with 80% stenosis. 4. Long segment of moderate to severe disease in the proximal to mid right coronary artery, up to 90%. This is likely the culprit for his presentation today.  5. Decreased left ventricular systolic function. LVEDP 20 mmHg. Ejection fraction 30%. RECOMMENDATION: Patient has multivessel disease. Per the history, he has not been compliant with his medications. Given that he may not be compliant with dual antiplatelet therapy and he is diabetic, we'll obtain surgical consult. He also has significantly decreased left ventricular function. He will stay in the hospital until his bypass surgery.    Assessment/Plan:  He has severe 3-vessel coronary artery disease with moderate LV dysfunction s/p acute NSTEMI. He has continued left arm pain an heparin that seems out of proportion to his disease and has been persistent since Wednesday. It is hard to tell if this is related to his heart. His troponin is falling. His creatinine was 2.1 this am and 1.8 this afternoon. It was normal last summer. His  diabetes is poorly controlled and he has a large bag of candies that are not sugar-free in his room that he says he eats continuously for his dry mouth. I agree that CABG is indicated for this patient with diabetes and severe 3-vessel CAD. I will do on Monday if his creatinine comes down.  BARTLE,BRYAN K 02/04/2013, 3:13 PM

## 2013-02-05 ENCOUNTER — Inpatient Hospital Stay (HOSPITAL_COMMUNITY): Payer: Medicare Other

## 2013-02-05 DIAGNOSIS — I2510 Atherosclerotic heart disease of native coronary artery without angina pectoris: Secondary | ICD-10-CM | POA: Diagnosis present

## 2013-02-05 LAB — CBC
HEMATOCRIT: 38.5 % — AB (ref 39.0–52.0)
HEMOGLOBIN: 12.9 g/dL — AB (ref 13.0–17.0)
MCH: 30.4 pg (ref 26.0–34.0)
MCHC: 33.5 g/dL (ref 30.0–36.0)
MCV: 90.8 fL (ref 78.0–100.0)
Platelets: 216 10*3/uL (ref 150–400)
RBC: 4.24 MIL/uL (ref 4.22–5.81)
RDW: 13.9 % (ref 11.5–15.5)
WBC: 10.1 10*3/uL (ref 4.0–10.5)

## 2013-02-05 LAB — BASIC METABOLIC PANEL
BUN: 30 mg/dL — AB (ref 6–23)
CO2: 25 meq/L (ref 19–32)
Calcium: 8.8 mg/dL (ref 8.4–10.5)
Chloride: 96 mEq/L (ref 96–112)
Creatinine, Ser: 1.21 mg/dL (ref 0.50–1.35)
GFR calc non Af Amer: 67 mL/min — ABNORMAL LOW (ref >90)
GFR, EST AFRICAN AMERICAN: 78 mL/min — AB (ref >90)
Glucose, Bld: 222 mg/dL — ABNORMAL HIGH (ref 70–99)
POTASSIUM: 4.4 meq/L (ref 3.7–5.3)
Sodium: 135 mEq/L — ABNORMAL LOW (ref 137–147)

## 2013-02-05 LAB — SURGICAL PCR SCREEN
MRSA, PCR: POSITIVE — AB
Staphylococcus aureus: POSITIVE — AB

## 2013-02-05 LAB — GLUCOSE, CAPILLARY
GLUCOSE-CAPILLARY: 276 mg/dL — AB (ref 70–99)
Glucose-Capillary: 231 mg/dL — ABNORMAL HIGH (ref 70–99)
Glucose-Capillary: 244 mg/dL — ABNORMAL HIGH (ref 70–99)
Glucose-Capillary: 141 mg/dL — ABNORMAL HIGH (ref 70–99)

## 2013-02-05 LAB — ABO/RH: ABO/RH(D): A NEG

## 2013-02-05 LAB — URINALYSIS, ROUTINE W REFLEX MICROSCOPIC
Bilirubin Urine: NEGATIVE
Glucose, UA: >1000 mg/dL — AB
Hgb urine dipstick: NEGATIVE
KETONES UR: NEGATIVE mg/dL
LEUKOCYTES UA: NEGATIVE
Nitrite: NEGATIVE
Protein, ur: NEGATIVE mg/dL
Specific Gravity, Urine: 1.037 — ABNORMAL HIGH (ref 1.005–1.030)
Urobilinogen, UA: 1 mg/dL (ref 0.0–1.0)
pH: 5 (ref 5.0–8.0)

## 2013-02-05 LAB — HEPARIN LEVEL (UNFRACTIONATED)
HEPARIN UNFRACTIONATED: 0.36 [IU]/mL (ref 0.30–0.70)
Heparin Unfractionated: 0.37 IU/mL (ref 0.30–0.70)

## 2013-02-05 LAB — URINE MICROSCOPIC-ADD ON

## 2013-02-05 MED ORDER — DOPAMINE-DEXTROSE 3.2-5 MG/ML-% IV SOLN
2.0000 ug/kg/min | INTRAVENOUS | Status: DC
  Filled 2013-02-05: qty 250

## 2013-02-05 MED ORDER — BISACODYL 5 MG PO TBEC
5.0000 mg | DELAYED_RELEASE_TABLET | Freq: Once | ORAL | Status: AC
  Administered 2013-02-05: 5 mg via ORAL
  Filled 2013-02-05: qty 1

## 2013-02-05 MED ORDER — MAGNESIUM SULFATE 50 % IJ SOLN
40.0000 meq | INTRAMUSCULAR | Status: DC
  Filled 2013-02-05: qty 10

## 2013-02-05 MED ORDER — DEXTROSE 5 % IV SOLN
1.5000 g | INTRAVENOUS | Status: AC
  Administered 2013-02-06: 1.5 g via INTRAVENOUS
  Administered 2013-02-06: .75 g via INTRAVENOUS
  Filled 2013-02-05: qty 1.5

## 2013-02-05 MED ORDER — EPINEPHRINE HCL 1 MG/ML IJ SOLN
0.5000 ug/min | INTRAVENOUS | Status: DC
  Filled 2013-02-05: qty 4

## 2013-02-05 MED ORDER — MUPIROCIN 2 % EX OINT
1.0000 "application " | TOPICAL_OINTMENT | Freq: Two times a day (BID) | CUTANEOUS | Status: DC
  Administered 2013-02-05: 1 via NASAL
  Filled 2013-02-05: qty 22

## 2013-02-05 MED ORDER — NITROGLYCERIN IN D5W 200-5 MCG/ML-% IV SOLN
2.0000 ug/min | INTRAVENOUS | Status: DC
  Filled 2013-02-05: qty 250

## 2013-02-05 MED ORDER — VANCOMYCIN HCL 10 G IV SOLR
1500.0000 mg | INTRAVENOUS | Status: AC
  Administered 2013-02-06: 1500 mg via INTRAVENOUS
  Filled 2013-02-05: qty 1500

## 2013-02-05 MED ORDER — PHENYLEPHRINE HCL 10 MG/ML IJ SOLN
30.0000 ug/min | INTRAVENOUS | Status: DC
  Filled 2013-02-05: qty 2

## 2013-02-05 MED ORDER — DEXMEDETOMIDINE HCL IN NACL 400 MCG/100ML IV SOLN
0.1000 ug/kg/h | INTRAVENOUS | Status: DC
  Filled 2013-02-05: qty 100

## 2013-02-05 MED ORDER — POTASSIUM CHLORIDE 2 MEQ/ML IV SOLN
80.0000 meq | INTRAVENOUS | Status: DC
  Filled 2013-02-05: qty 40

## 2013-02-05 MED ORDER — SODIUM CHLORIDE 0.9 % IV SOLN
INTRAVENOUS | Status: DC
  Filled 2013-02-05: qty 40

## 2013-02-05 MED ORDER — TEMAZEPAM 15 MG PO CAPS: 15.0000 mg | ORAL_CAPSULE | Freq: Once | ORAL | Status: AC | PRN

## 2013-02-05 MED ORDER — CHLORHEXIDINE GLUCONATE CLOTH 2 % EX PADS
6.0000 | MEDICATED_PAD | Freq: Once | CUTANEOUS | Status: AC
  Administered 2013-02-05: 6 via TOPICAL

## 2013-02-05 MED ORDER — METOPROLOL TARTRATE 12.5 MG HALF TABLET
12.5000 mg | ORAL_TABLET | Freq: Once | ORAL | Status: AC
  Administered 2013-02-06: 12.5 mg via ORAL
  Filled 2013-02-05 (×2): qty 1

## 2013-02-05 MED ORDER — CHLORHEXIDINE GLUCONATE CLOTH 2 % EX PADS
6.0000 | MEDICATED_PAD | Freq: Every day | CUTANEOUS | Status: DC
  Administered 2013-02-06: 6 via TOPICAL

## 2013-02-05 MED ORDER — SODIUM CHLORIDE 0.9 % IV SOLN
INTRAVENOUS | Status: DC
  Filled 2013-02-05: qty 1

## 2013-02-05 MED ORDER — DEXTROSE 5 % IV SOLN
750.0000 mg | INTRAVENOUS | Status: DC
  Filled 2013-02-05: qty 750

## 2013-02-05 MED ORDER — SODIUM CHLORIDE 0.9 % IV SOLN
INTRAVENOUS | Status: DC
  Filled 2013-02-05: qty 30

## 2013-02-05 MED ORDER — PLASMA-LYTE 148 IV SOLN
INTRAVENOUS | Status: AC
  Administered 2013-02-06: 09:00:00
  Filled 2013-02-05: qty 2.5

## 2013-02-05 MED ORDER — CHLORHEXIDINE GLUCONATE CLOTH 2 % EX PADS: 6.0000 | MEDICATED_PAD | Freq: Once | CUTANEOUS | Status: DC

## 2013-02-05 NOTE — Progress Notes (Signed)
ANTICOAGULATION CONSULT NOTE - Follow Up Consult  Pharmacy Consult for heparin Indication: mulivessel CAD  Allergies  Allergen Reactions  . Tylenol [Acetaminophen] Hives  . Propoxyphene N-Acetaminophen Other (See Comments)    unknown  . Lodine [Etodolac] Swelling and Rash    Patient Measurements: Height: 5\' 9"  (175.3 cm) Weight: 265 lb 14 oz (120.6 kg) IBW/kg (Calculated) : 70.7 Heparin Dosing Weight: 97.1 kg  Vital Signs: Temp: 98 F (36.7 C) (01/25 1116) Temp src: Oral (01/25 1116) BP: 123/70 mmHg (01/25 1116) Pulse Rate: 88 (01/25 1116)  Labs:  Recent Labs  02/03/13 2145 02/04/13 0314 02/04/13 0900 02/04/13 1420 02/04/13 1848 02/05/13 0300 02/05/13 1245  HGB  --  13.5  --   --   --  12.9*  --   HCT  --  40.3  --   --   --  38.5*  --   PLT  --  233  --   --   --  216  --   HEPARINUNFRC  --   --  <0.10*  --  0.14* 0.36  --   CREATININE  --  2.13*  --  1.80*  --   --  1.21  TROPONINI 6.40* 3.91*  --   --   --   --   --     Estimated Creatinine Clearance: 91.6 ml/min (by C-G formula based on Cr of 1.21).   Medications:  Scheduled:  . albuterol  2.5 mg Nebulization BID  . alprazolam  1 mg Oral QID  . amLODipine  5 mg Oral Daily  . amphetamine-dextroamphetamine  60 mg Oral Daily  . aspirin  81 mg Oral Daily  . atorvastatin  80 mg Oral q1800  . Canagliflozin  100 mg Oral Daily  . cloNIDine  0.1 mg Oral Daily  . DULoxetine  30 mg Oral Daily  . famotidine  10 mg Oral QHS  . gabapentin  300 mg Oral TID  . insulin aspart  0-15 Units Subcutaneous TID WC  . insulin detemir  80 Units Subcutaneous QHS  . meloxicam  15 mg Oral Daily  . metoprolol tartrate  12.5 mg Oral TID  . mometasone-formoterol  2 puff Inhalation BID  . oxyCODONE  10 mg Oral BID  . pantoprazole  40 mg Oral Daily  . sodium chloride  3 mL Intravenous Q12H  . tiotropium  18 mcg Inhalation Daily   Infusions:  . heparin 2,000 Units/hr (02/05/13 0400)  . nitroGLYCERIN Stopped (02/03/13 2330)     Assessment: 53 y/o male (transferred from [place]) with a hx of CAD s/p cath resumed on heparin for multivessel CAD. CABG is planned for Monday pending improvement in renal function.  This morning's heparin level is therapeutic at 0.36 after last night's bolus of 2000 units and increased rate to 2000 units/hr.  SCr has continued to improve to 1.21 with estimated CrCl ~92. H/H is low and trending down, but plt are wnl.  No issues of bleeding reported.  Goal of Therapy:  Heparin level 0.3-0.7 units/ml Monitor platelets by anticoagulation protocol: Yes   Plan:  - continue heparin IV gtt at 2000 units/hr.  - draw confirmatory this afternoon with improving kidney function - daily HL, CBC - monitor for s/s of bleeding  Ovid Curd E. Jacqlyn Larsen, PharmD Clinical Pharmacist - Resident Pager: [phone] Pharmacy: [phone] 02/05/2013 2:07 PM

## 2013-02-05 NOTE — Progress Notes (Signed)
Pt placed on contact isolation for + MRSA nasal swab .Info provided to Pt and spouse .

## 2013-02-05 NOTE — Progress Notes (Signed)
ANTICOAGULATION CONSULT NOTE - Follow Up Consult  Pharmacy Consult for heparin Indication: mulivessel CAD  Allergies  Allergen Reactions  . Tylenol [Acetaminophen] Hives  . Propoxyphene N-Acetaminophen Other (See Comments)    unknown  . Lodine [Etodolac] Swelling and Rash    Labs:  Recent Labs  02/03/13 2145 02/04/13 0314  02/04/13 1420 02/04/13 1848 02/05/13 0300 02/05/13 1245 02/05/13 1458  HGB  --  13.5  --   --   --  12.9*  --   --   HCT  --  40.3  --   --   --  38.5*  --   --   PLT  --  233  --   --   --  216  --   --   HEPARINUNFRC  --   --   < >  --  0.14* 0.36  --  0.37  CREATININE  --  2.13*  --  1.80*  --   --  1.21  --   TROPONINI 6.40* 3.91*  --   --   --   --   --   --   < > = values in this interval not displayed.  Estimated Creatinine Clearance: 91.6 ml/min (by C-G formula based on Cr of 1.21).   Assessment: 53 y/o male (transferred from [place]) with a hx of CAD s/p cath resumed on heparin for multivessel CAD. CABG is planned for Monday pending improvement in renal function.  Heparin level therapeutic x 2  Goal of Therapy:  Heparin level 0.3-0.7 units/ml Monitor platelets by anticoagulation protocol: Yes   Plan:  - Heparin at 2000 units / hr - Follow up AM labs  Thank you. Anette Guarneri, PharmD [phone] 02/05/2013 4:00 PM

## 2013-02-05 NOTE — Progress Notes (Signed)
2 Days Post-Op Procedure(s) (LRB): LEFT HEART CATHETERIZATION WITH CORONARY ANGIOGRAM (N/A) Subjective: No chest pain. Says he still has some left arm and shoulder pain. Reports tingling in legs.  Objective: Vital signs in last 24 hours: Temp:  [97.4 F (36.3 C)-98.4 F (36.9 C)] 98 F (36.7 C) (01/25 1116) Pulse Rate:  [81-96] 88 (01/25 1116) Cardiac Rhythm:  [-] Normal sinus rhythm (01/24 2010) Resp:  [20-22] 20 (01/25 1116) BP: (88-123)/(52-76) 123/70 mmHg (01/25 1116) SpO2:  [96 %-100 %] 99 % (01/25 1116) Weight:  [120.6 kg (265 lb 14 oz)] 120.6 kg (265 lb 14 oz) (01/25 0402)  Hemodynamic parameters for last 24 hours:    Intake/Output from previous day: 01/24 0701 - 01/25 0700 In: 1450 [P.O.:1060; I.V.:390] Out: 900 [Urine:900] Intake/Output this shift: Total I/O In: 20 [I.V.:20] Out: 450 [Urine:450]  General appearance: alert and cooperative Neurologic: intact Heart: regular rate and rhythm, S1, S2 normal, no murmur, click, rub or gallop Lungs: clear to auscultation bilaterally  Lab Results:  Recent Labs  02/04/13 0314 02/05/13 0300  WBC 11.0* 10.1  HGB 13.5 12.9*  HCT 40.3 38.5*  PLT 233 216   BMET:  Recent Labs  02/04/13 1420 02/05/13 1245  NA 136* 135*  K 3.8 4.4  CL 97 96  CO2 24 25  GLUCOSE 281* 222*  BUN 26* 30*  CREATININE 1.80* 1.21  CALCIUM 8.7 8.8    PT/INR: No results found for this basename: LABPROT, INR,  in the last 72 hours ABG    Component Value Date/Time   PHART 7.529* 05/21/2011 2120   HCO3 22.1 05/21/2011 2120   TCO2 18.2 05/21/2011 2120   ACIDBASEDEF 0.4 05/21/2011 2120   O2SAT 97.1 05/21/2011 2120   CBG (last 3)   Recent Labs  02/04/13 2138 02/05/13 0736 02/05/13 1119  GLUCAP 336* 231* 244*    Assessment/Plan: Severe multi-vessel coronary artery disease. His creatinine is back to 1.2. Plan CABG in am. I discussed the operative procedure with the patient  including alternatives, benefits and risks; including but not  limited to bleeding, blood transfusion, infection, stroke, myocardial infarction, graft failure, heart block requiring a permanent pacemaker, organ dysfunction, and death.  Manuel Knight understands and agrees to proceed.  . LOS: 2 days    Manuel Knight K 02/05/2013

## 2013-02-05 NOTE — Progress Notes (Signed)
Pt watching video # 113 ( Recovering from Heart Surgery) . Spouse at bedside.

## 2013-02-05 NOTE — Progress Notes (Signed)
Pt watching video # 112 ( Preparing for Heart Surgery) .

## 2013-02-05 NOTE — Progress Notes (Addendum)
Pre-op orders reviewed w/dr Cyndia Bent . P CXR instead of 2 view to be done .  nothing that is not  in current orders  Is to be done.

## 2013-02-05 NOTE — Progress Notes (Signed)
  SUBJECTIVE:  Denies any chest pain  OBJECTIVE:   Vitals:   Filed Vitals:   02/05/13 0733 02/05/13 0817 02/05/13 0930 02/05/13 1116  BP: 113/76  118/66 123/70  Pulse: 86   88  Temp: 97.8 F (36.6 C)   98 F (36.7 C)  TempSrc: Oral   Oral  Resp: 22   20  Height:      Weight:      SpO2: 97% 98%  99%   I&O's:   Intake/Output Summary (Last 24 hours) at 02/05/13 1153 Last data filed at 02/05/13 0844  Gross per 24 hour  Intake   1174 ml  Output   1350 ml  Net   -176 ml   TELEMETRY: Reviewed telemetry pt in NSR:     PHYSICAL EXAM General: Well developed, well nourished, in no acute distress Head: Eyes PERRLA, No xanthomas.   Normal cephalic and atramatic  Lungs:   Clear bilaterally to auscultation and percussion. Heart:   HRRR S1 S2 Pulses are 2+ & equal. Abdomen: Bowel sounds are positive, abdomen soft and non-tender without masses  Extremities:   No clubbing, cyanosis or edema.  DP +1 Neuro: Alert and oriented X 3. Psych:  Good affect, responds appropriately   LABS: Basic Metabolic Panel:  Recent Labs  02/04/13 0314 02/04/13 1420  NA 136* 136*  K 4.3 3.8  CL 98 97  CO2 24 24  GLUCOSE 295* 281*  BUN 19 26*  CREATININE 2.13* 1.80*  CALCIUM 8.6 8.7   Liver Function Tests:  Recent Labs  02/04/13 0314  AST 37  ALT 24  ALKPHOS 59  BILITOT 0.5  PROT 6.0  ALBUMIN 2.9*   No results found for this basename: LIPASE, AMYLASE,  in the last 72 hours CBC:  Recent Labs  02/04/13 0314 02/05/13 0300  WBC 11.0* 10.1  HGB 13.5 12.9*  HCT 40.3 38.5*  MCV 90.4 90.8  PLT 233 216   Cardiac Enzymes:  Recent Labs  02/03/13 2145 02/04/13 0314  TROPONINI 6.40* 3.91*   BNP: No components found with this basename: POCBNP,  D-Dimer: No results found for this basename: DDIMER,  in the last 72 hours Hemoglobin A1C: No results found for this basename: HGBA1C,  in the last 72 hours Fasting Lipid Panel:  Recent Labs  02/04/13 0314  CHOL 205*  HDL 26*    LDLCALC 133*  TRIG 229*  CHOLHDL 7.9   Thyroid Function Tests: No results found for this basename: TSH, T4TOTAL, FREET3, T3FREE, THYROIDAB,  in the last 72 hours Anemia Panel: No results found for this basename: VITAMINB12, FOLATE, FERRITIN, TIBC, IRON, RETICCTPCT,  in the last 72 hours Coag Panel:   No results found for this basename: INR, PROTIME    RADIOLOGY: No results found.  ASSESSMENT/PLAN: 1. CAD/ NSTEMI  Severe 3V CAD LVEF 30% LVEDP 20 mm -plan for CABG Monday if creatine stable  - Continue lopressor/ASA/statin /IV Heparin 2. COPD Significant  3. HTN stable and low BP improved after med adjustments yesterday.  Continue amlodipine/beta blocker/Clonidine 4. Renal Signif bump in Cr yesterday and ACE I stopped.  BMET this am pending 5. HL On lipitor 80.  6. Elbow Will check URic acid     Sueanne Margarita, MD  02/05/2013  11:53 AM

## 2013-02-06 ENCOUNTER — Inpatient Hospital Stay (HOSPITAL_COMMUNITY): Payer: Medicare Other | Admitting: Certified Registered Nurse Anesthetist

## 2013-02-06 ENCOUNTER — Inpatient Hospital Stay (HOSPITAL_COMMUNITY): Payer: Medicare Other

## 2013-02-06 ENCOUNTER — Encounter (HOSPITAL_COMMUNITY)
Admission: AD | Disposition: A | Discharge status: Home / Self Care | Payer: Medicare Other | Source: Other Acute Inpatient Hospital | Attending: Surgery

## 2013-02-06 ENCOUNTER — Encounter (HOSPITAL_COMMUNITY): Payer: Medicare Other | Admitting: Certified Registered Nurse Anesthetist

## 2013-02-06 DIAGNOSIS — I2510 Atherosclerotic heart disease of native coronary artery without angina pectoris: Secondary | ICD-10-CM

## 2013-02-06 DIAGNOSIS — Z951 Presence of aortocoronary bypass graft: Secondary | ICD-10-CM

## 2013-02-06 HISTORY — PX: CORONARY ARTERY BYPASS GRAFT: SHX141

## 2013-02-06 LAB — POCT I-STAT 4, (NA,K, GLUC, HGB,HCT)
GLUCOSE: 143 mg/dL — AB (ref 70–99)
GLUCOSE: 164 mg/dL — AB (ref 70–99)
Glucose, Bld: 154 mg/dL — ABNORMAL HIGH (ref 70–99)
Glucose, Bld: 166 mg/dL — ABNORMAL HIGH (ref 70–99)
Glucose, Bld: 149 mg/dL — ABNORMAL HIGH (ref 70–99)
HCT: 38 % — ABNORMAL LOW (ref 39.0–52.0)
HCT: 33 % — ABNORMAL LOW (ref 39.0–52.0)
HCT: 38 % — ABNORMAL LOW (ref 39.0–52.0)
HEMATOCRIT: 34 % — AB (ref 39.0–52.0)
HEMATOCRIT: 51 % (ref 39.0–52.0)
HEMOGLOBIN: 12.9 g/dL — AB (ref 13.0–17.0)
HEMOGLOBIN: 11.2 g/dL — AB (ref 13.0–17.0)
HEMOGLOBIN: 11.6 g/dL — AB (ref 13.0–17.0)
Hemoglobin: 12.9 g/dL — ABNORMAL LOW (ref 13.0–17.0)
Hemoglobin: 17.3 g/dL — ABNORMAL HIGH (ref 13.0–17.0)
POTASSIUM: 4.4 meq/L (ref 3.7–5.3)
Potassium: 4.6 mEq/L (ref 3.7–5.3)
Potassium: 4.4 mEq/L (ref 3.7–5.3)
Potassium: 4.6 mEq/L (ref 3.7–5.3)
Potassium: 3.8 mEq/L (ref 3.7–5.3)
Sodium: 137 mEq/L (ref 137–147)
Sodium: 137 mEq/L (ref 137–147)
Sodium: 139 mEq/L (ref 137–147)
Sodium: 138 mEq/L (ref 137–147)
Sodium: 140 mEq/L (ref 137–147)

## 2013-02-06 LAB — POCT I-STAT GLUCOSE
Glucose, Bld: 144 mg/dL — ABNORMAL HIGH (ref 70–99)
Operator id: 3408

## 2013-02-06 LAB — BASIC METABOLIC PANEL
BUN: 26 mg/dL — ABNORMAL HIGH (ref 6–23)
CHLORIDE: 99 meq/L (ref 96–112)
CO2: 26 meq/L (ref 19–32)
Calcium: 9 mg/dL (ref 8.4–10.5)
Creatinine, Ser: 1.11 mg/dL (ref 0.50–1.35)
GFR calc non Af Amer: 75 mL/min — ABNORMAL LOW (ref >90)
GFR, EST AFRICAN AMERICAN: 87 mL/min — AB (ref >90)
Glucose, Bld: 174 mg/dL — ABNORMAL HIGH (ref 70–99)
POTASSIUM: 4.8 meq/L (ref 3.7–5.3)
SODIUM: 137 meq/L (ref 137–147)

## 2013-02-06 LAB — POCT I-STAT 3, ART BLOOD GAS (G3+)
ACID-BASE DEFICIT: 2 mmol/L (ref 0.0–2.0)
Acid-Base Excess: 1 mmol/L (ref 0.0–2.0)
Acid-Base Excess: 1 mmol/L (ref 0.0–2.0)
Acid-base deficit: 1 mmol/L (ref 0.0–2.0)
Acid-base deficit: 2 mmol/L (ref 0.0–2.0)
BICARBONATE: 26.9 meq/L — AB (ref 20.0–24.0)
Bicarbonate: 28.1 mEq/L — ABNORMAL HIGH (ref 20.0–24.0)
Bicarbonate: 25.9 mEq/L — ABNORMAL HIGH (ref 20.0–24.0)
Bicarbonate: 25.4 mEq/L — ABNORMAL HIGH (ref 20.0–24.0)
Bicarbonate: 24.1 mEq/L — ABNORMAL HIGH (ref 20.0–24.0)
Bicarbonate: 23.5 mEq/L (ref 20.0–24.0)
O2 SAT: 93 %
O2 Saturation: 100 %
O2 Saturation: 100 %
O2 Saturation: 99 %
O2 Saturation: 95 %
O2 Saturation: 91 %
PCO2 ART: 56.1 mmHg — AB (ref 35.0–45.0)
PCO2 ART: 47 mmHg — AB (ref 35.0–45.0)
PH ART: 7.377 (ref 7.350–7.450)
PH ART: 7.314 — AB (ref 7.350–7.450)
PO2 ART: 71 mmHg — AB (ref 80.0–100.0)
PO2 ART: 88 mmHg (ref 80.0–100.0)
Patient temperature: 36.7
Patient temperature: 38.3
Patient temperature: 38.6
TCO2: 30 mmol/L (ref 0–100)
TCO2: 28 mmol/L (ref 0–100)
TCO2: 27 mmol/L (ref 0–100)
TCO2: 27 mmol/L (ref 0–100)
TCO2: 25 mmol/L (ref 0–100)
TCO2: 25 mmol/L (ref 0–100)
pCO2 arterial: 45.8 mmHg — ABNORMAL HIGH (ref 35.0–45.0)
pCO2 arterial: 44.4 mmHg (ref 35.0–45.0)
pCO2 arterial: 47.4 mmHg — ABNORMAL HIGH (ref 35.0–45.0)
pCO2 arterial: 45.8 mmHg — ABNORMAL HIGH (ref 35.0–45.0)
pH, Arterial: 7.307 — ABNORMAL LOW (ref 7.350–7.450)
pH, Arterial: 7.375 (ref 7.350–7.450)
pH, Arterial: 7.335 — ABNORMAL LOW (ref 7.350–7.450)
pH, Arterial: 7.335 — ABNORMAL LOW (ref 7.350–7.450)
pO2, Arterial: 221 mmHg — ABNORMAL HIGH (ref 80.0–100.0)
pO2, Arterial: 316 mmHg — ABNORMAL HIGH (ref 80.0–100.0)
pO2, Arterial: 172 mmHg — ABNORMAL HIGH (ref 80.0–100.0)
pO2, Arterial: 72 mmHg — ABNORMAL LOW (ref 80.0–100.0)

## 2013-02-06 LAB — CBC
HCT: 37.7 % — ABNORMAL LOW (ref 39.0–52.0)
HCT: 36.7 % — ABNORMAL LOW (ref 39.0–52.0)
HEMATOCRIT: 37.2 % — AB (ref 39.0–52.0)
HEMOGLOBIN: 12.2 g/dL — AB (ref 13.0–17.0)
Hemoglobin: 12.9 g/dL — ABNORMAL LOW (ref 13.0–17.0)
Hemoglobin: 12.3 g/dL — ABNORMAL LOW (ref 13.0–17.0)
MCH: 31.2 pg (ref 26.0–34.0)
MCH: 30.1 pg (ref 26.0–34.0)
MCH: 30.3 pg (ref 26.0–34.0)
MCHC: 34.2 g/dL (ref 30.0–36.0)
MCHC: 33.1 g/dL (ref 30.0–36.0)
MCHC: 33.2 g/dL (ref 30.0–36.0)
MCV: 91.3 fL (ref 78.0–100.0)
MCV: 91 fL (ref 78.0–100.0)
MCV: 91.3 fL (ref 78.0–100.0)
PLATELETS: 211 10*3/uL (ref 150–400)
Platelets: 171 10*3/uL (ref 150–400)
Platelets: 181 10*3/uL (ref 150–400)
RBC: 4.13 MIL/uL — AB (ref 4.22–5.81)
RBC: 4.09 MIL/uL — ABNORMAL LOW (ref 4.22–5.81)
RBC: 4.02 MIL/uL — AB (ref 4.22–5.81)
RDW: 14 % (ref 11.5–15.5)
RDW: 13.9 % (ref 11.5–15.5)
RDW: 13.9 % (ref 11.5–15.5)
WBC: 10.3 10*3/uL (ref 4.0–10.5)
WBC: 14.8 10*3/uL — ABNORMAL HIGH (ref 4.0–10.5)
WBC: 13.8 10*3/uL — AB (ref 4.0–10.5)

## 2013-02-06 LAB — GLUCOSE, CAPILLARY
GLUCOSE-CAPILLARY: 109 mg/dL — AB (ref 70–99)
GLUCOSE-CAPILLARY: 109 mg/dL — AB (ref 70–99)
GLUCOSE-CAPILLARY: 94 mg/dL (ref 70–99)
Glucose-Capillary: 159 mg/dL — ABNORMAL HIGH (ref 70–99)
Glucose-Capillary: 101 mg/dL — ABNORMAL HIGH (ref 70–99)
Glucose-Capillary: 91 mg/dL (ref 70–99)

## 2013-02-06 LAB — HEMOGLOBIN AND HEMATOCRIT, BLOOD
HCT: 30.7 % — ABNORMAL LOW (ref 39.0–52.0)
Hemoglobin: 10.4 g/dL — ABNORMAL LOW (ref 13.0–17.0)

## 2013-02-06 LAB — MAGNESIUM: MAGNESIUM: 2.3 mg/dL (ref 1.5–2.5)

## 2013-02-06 LAB — POCT I-STAT 3, VENOUS BLOOD GAS (G3P V)
BICARBONATE: 27.7 meq/L — AB (ref 20.0–24.0)
O2 Saturation: 69 %
PH VEN: 7.301 — AB (ref 7.250–7.300)
PO2 VEN: 40 mmHg (ref 30.0–45.0)
TCO2: 29 mmol/L (ref 0–100)
pCO2, Ven: 56.2 mmHg — ABNORMAL HIGH (ref 45.0–50.0)

## 2013-02-06 LAB — POCT I-STAT, CHEM 8
BUN: 17 mg/dL (ref 6–23)
Calcium, Ion: 1.18 mmol/L (ref 1.12–1.23)
Chloride: 105 mEq/L (ref 96–112)
Creatinine, Ser: 0.9 mg/dL (ref 0.50–1.35)
GLUCOSE: 112 mg/dL — AB (ref 70–99)
HCT: 38 % — ABNORMAL LOW (ref 39.0–52.0)
Hemoglobin: 12.9 g/dL — ABNORMAL LOW (ref 13.0–17.0)
Potassium: 4.6 mEq/L (ref 3.7–5.3)
Sodium: 139 mEq/L (ref 137–147)
TCO2: 23 mmol/L (ref 0–100)

## 2013-02-06 LAB — PROTIME-INR
INR: 1.59 — AB (ref 0.00–1.49)
Prothrombin Time: 18.5 seconds — ABNORMAL HIGH (ref 11.6–15.2)

## 2013-02-06 LAB — CREATININE, SERUM: CREATININE: 0.83 mg/dL (ref 0.50–1.35)

## 2013-02-06 LAB — HEPARIN LEVEL (UNFRACTIONATED): HEPARIN UNFRACTIONATED: 0.41 [IU]/mL (ref 0.30–0.70)

## 2013-02-06 LAB — APTT: aPTT: 40 seconds — ABNORMAL HIGH (ref 24–37)

## 2013-02-06 LAB — PLATELET COUNT: PLATELETS: 163 10*3/uL (ref 150–400)

## 2013-02-06 SURGERY — CORONARY ARTERY BYPASS GRAFTING (CABG)
Anesthesia: General | Site: Chest

## 2013-02-06 MED ORDER — LIDOCAINE HCL (CARDIAC) 20 MG/ML IV SOLN
INTRAVENOUS | Status: AC
  Filled 2013-02-06: qty 5

## 2013-02-06 MED ORDER — ACETAMINOPHEN 650 MG RE SUPP: 650.0000 mg | Freq: Once | RECTAL | Status: DC

## 2013-02-06 MED ORDER — ALBUMIN HUMAN 5 % IV SOLN
INTRAVENOUS | Status: DC | PRN
  Administered 2013-02-06: 14:00:00 via INTRAVENOUS

## 2013-02-06 MED ORDER — MIDAZOLAM HCL 10 MG/2ML IJ SOLN
INTRAMUSCULAR | Status: AC
  Filled 2013-02-06: qty 2

## 2013-02-06 MED ORDER — PROPOFOL 10 MG/ML IV BOLUS
INTRAVENOUS | Status: DC | PRN
  Administered 2013-02-06: 60 mg via INTRAVENOUS

## 2013-02-06 MED ORDER — METOPROLOL TARTRATE 1 MG/ML IV SOLN
2.5000 mg | INTRAVENOUS | Status: DC | PRN
  Administered 2013-02-06 – 2013-02-07 (×3): 5 mg via INTRAVENOUS

## 2013-02-06 MED ORDER — ACETAMINOPHEN 160 MG/5ML PO SOLN: 650.0000 mg | Freq: Once | ORAL | Status: DC

## 2013-02-06 MED ORDER — INSULIN REGULAR BOLUS VIA INFUSION
0.0000 [IU] | Freq: Three times a day (TID) | INTRAVENOUS | Status: DC
  Filled 2013-02-06: qty 10

## 2013-02-06 MED ORDER — SODIUM CHLORIDE 0.9 % IV SOLN
200.0000 ug | INTRAVENOUS | Status: DC | PRN
  Administered 2013-02-06: 0.2 ug/kg/h via INTRAVENOUS

## 2013-02-06 MED ORDER — ASPIRIN 81 MG PO CHEW: 324.0000 mg | CHEWABLE_TABLET | Freq: Every day | ORAL | Status: DC

## 2013-02-06 MED ORDER — MAGNESIUM SULFATE 40 MG/ML IJ SOLN
4.0000 g | Freq: Once | INTRAMUSCULAR | Status: AC
Start: 2013-02-06 — End: 2013-02-06
  Administered 2013-02-06: 4 g via INTRAVENOUS
  Filled 2013-02-06: qty 100

## 2013-02-06 MED ORDER — PHENYLEPHRINE HCL 10 MG/ML IJ SOLN
10.0000 mg | INTRAVENOUS | Status: DC | PRN
  Administered 2013-02-06: 40 ug/min via INTRAVENOUS

## 2013-02-06 MED ORDER — DOPAMINE-DEXTROSE 3.2-5 MG/ML-% IV SOLN
INTRAVENOUS | Status: DC | PRN
  Administered 2013-02-06: 5 ug/kg/min via INTRAVENOUS

## 2013-02-06 MED ORDER — SODIUM CHLORIDE 0.45 % IV SOLN
INTRAVENOUS | Status: DC
  Administered 2013-02-06: 20 mL/h via INTRAVENOUS

## 2013-02-06 MED ORDER — MORPHINE SULFATE 2 MG/ML IJ SOLN
2.0000 mg | INTRAMUSCULAR | Status: DC | PRN
  Administered 2013-02-06 (×2): 2 mg via INTRAVENOUS
  Administered 2013-02-07 (×5): 4 mg via INTRAVENOUS
  Administered 2013-02-08: 2 mg via INTRAVENOUS
  Filled 2013-02-06: qty 2
  Filled 2013-02-06: qty 1
  Filled 2013-02-06 (×3): qty 2
  Filled 2013-02-06: qty 1
  Filled 2013-02-06: qty 2
  Filled 2013-02-06: qty 1
  Filled 2013-02-06 (×2): qty 2

## 2013-02-06 MED ORDER — PROTAMINE SULFATE 10 MG/ML IV SOLN
INTRAVENOUS | Status: DC | PRN
  Administered 2013-02-06: 540 mg via INTRAVENOUS
  Administered 2013-02-06: 10 mg via INTRAVENOUS

## 2013-02-06 MED ORDER — SUCCINYLCHOLINE CHLORIDE 20 MG/ML IJ SOLN
INTRAMUSCULAR | Status: AC
  Filled 2013-02-06: qty 1

## 2013-02-06 MED ORDER — SODIUM CHLORIDE 0.9 % IJ SOLN
INTRAMUSCULAR | Status: AC
  Filled 2013-02-06: qty 10

## 2013-02-06 MED ORDER — PANTOPRAZOLE SODIUM 40 MG PO TBEC
40.0000 mg | DELAYED_RELEASE_TABLET | Freq: Every day | ORAL | Status: DC
  Administered 2013-02-08 – 2013-02-13 (×6): 40 mg via ORAL
  Filled 2013-02-06 (×6): qty 1

## 2013-02-06 MED ORDER — VECURONIUM BROMIDE 10 MG IV SOLR
INTRAVENOUS | Status: DC | PRN
  Administered 2013-02-06: 5 mg via INTRAVENOUS

## 2013-02-06 MED ORDER — LACTATED RINGERS IV SOLN: 500.0000 mL | Freq: Once | INTRAVENOUS | Status: AC | PRN

## 2013-02-06 MED ORDER — LACTATED RINGERS IV SOLN
INTRAVENOUS | Status: DC | PRN
  Administered 2013-02-06: 08:00:00 via INTRAVENOUS

## 2013-02-06 MED ORDER — ATORVASTATIN CALCIUM 80 MG PO TABS
80.0000 mg | ORAL_TABLET | Freq: Every day | ORAL | Status: DC
  Administered 2013-02-07 – 2013-02-12 (×6): 80 mg via ORAL
  Filled 2013-02-06 (×7): qty 1

## 2013-02-06 MED ORDER — THROMBIN 20000 UNITS EX SOLR
CUTANEOUS | Status: DC | PRN
  Administered 2013-02-06: 20000 [IU] via TOPICAL

## 2013-02-06 MED ORDER — SODIUM CHLORIDE 0.9 % IJ SOLN: 3.0000 mL | INTRAMUSCULAR | Status: DC | PRN

## 2013-02-06 MED ORDER — SODIUM CHLORIDE 0.9 % IV SOLN
INTRAVENOUS | Status: DC | PRN
  Administered 2013-02-06: 14:00:00 via INTRAVENOUS

## 2013-02-06 MED ORDER — FENTANYL CITRATE 0.05 MG/ML IJ SOLN
INTRAMUSCULAR | Status: AC
  Filled 2013-02-06: qty 5

## 2013-02-06 MED ORDER — THROMBIN 20000 UNITS EX SOLR
OROMUCOSAL | Status: DC | PRN
  Administered 2013-02-06 (×3): via TOPICAL

## 2013-02-06 MED ORDER — SODIUM CHLORIDE 0.9 % IV SOLN: INTRAVENOUS | Status: DC

## 2013-02-06 MED ORDER — PROTAMINE SULFATE 10 MG/ML IV SOLN
INTRAVENOUS | Status: AC
  Filled 2013-02-06: qty 10

## 2013-02-06 MED ORDER — HEPARIN SODIUM (PORCINE) 1000 UNIT/ML IJ SOLN
INTRAMUSCULAR | Status: DC | PRN
  Administered 2013-02-06: 57 mL via INTRAVENOUS

## 2013-02-06 MED ORDER — BISACODYL 5 MG PO TBEC
10.0000 mg | DELAYED_RELEASE_TABLET | Freq: Every day | ORAL | Status: DC
  Administered 2013-02-07 – 2013-02-13 (×5): 10 mg via ORAL
  Filled 2013-02-06 (×5): qty 2

## 2013-02-06 MED ORDER — DOPAMINE-DEXTROSE 3.2-5 MG/ML-% IV SOLN: 0.0000 ug/kg/min | INTRAVENOUS | Status: DC

## 2013-02-06 MED ORDER — ACETAMINOPHEN 500 MG PO TABS: 1000.0000 mg | ORAL_TABLET | Freq: Four times a day (QID) | ORAL | Status: DC

## 2013-02-06 MED ORDER — ONDANSETRON HCL 4 MG/2ML IJ SOLN
INTRAMUSCULAR | Status: AC
  Filled 2013-02-06: qty 2

## 2013-02-06 MED ORDER — 0.9 % SODIUM CHLORIDE (POUR BTL) OPTIME
TOPICAL | Status: DC | PRN
  Administered 2013-02-06: 6000 mL

## 2013-02-06 MED ORDER — ROCURONIUM BROMIDE 100 MG/10ML IV SOLN
INTRAVENOUS | Status: DC | PRN
  Administered 2013-02-06: 100 mg via INTRAVENOUS

## 2013-02-06 MED ORDER — HEMOSTATIC AGENTS (NO CHARGE) OPTIME
TOPICAL | Status: DC | PRN
  Administered 2013-02-06: 1 via TOPICAL

## 2013-02-06 MED ORDER — DOCUSATE SODIUM 100 MG PO CAPS
200.0000 mg | ORAL_CAPSULE | Freq: Every day | ORAL | Status: DC
  Administered 2013-02-07 – 2013-02-13 (×5): 200 mg via ORAL
  Filled 2013-02-06 (×7): qty 2

## 2013-02-06 MED ORDER — ONDANSETRON HCL 4 MG/2ML IJ SOLN
4.0000 mg | Freq: Four times a day (QID) | INTRAMUSCULAR | Status: DC | PRN
  Administered 2013-02-07: 4 mg via INTRAVENOUS
  Filled 2013-02-06: qty 2

## 2013-02-06 MED ORDER — DULOXETINE HCL 30 MG PO CPEP
30.0000 mg | ORAL_CAPSULE | Freq: Every day | ORAL | Status: DC
  Administered 2013-02-07 – 2013-02-13 (×7): 30 mg via ORAL
  Filled 2013-02-06 (×7): qty 1

## 2013-02-06 MED ORDER — ROCURONIUM BROMIDE 50 MG/5ML IV SOLN
INTRAVENOUS | Status: AC
  Filled 2013-02-06: qty 2

## 2013-02-06 MED ORDER — EPHEDRINE SULFATE 50 MG/ML IJ SOLN
INTRAMUSCULAR | Status: AC
  Filled 2013-02-06: qty 1

## 2013-02-06 MED ORDER — SUCCINYLCHOLINE CHLORIDE 20 MG/ML IJ SOLN
INTRAMUSCULAR | Status: DC | PRN
  Administered 2013-02-06: 140 mg via INTRAVENOUS

## 2013-02-06 MED ORDER — ASPIRIN EC 325 MG PO TBEC
325.0000 mg | DELAYED_RELEASE_TABLET | Freq: Every day | ORAL | Status: DC
  Administered 2013-02-07 – 2013-02-13 (×7): 325 mg via ORAL
  Filled 2013-02-06 (×7): qty 1

## 2013-02-06 MED ORDER — FAMOTIDINE IN NACL 20-0.9 MG/50ML-% IV SOLN
20.0000 mg | Freq: Two times a day (BID) | INTRAVENOUS | Status: AC
  Administered 2013-02-06: 20 mg via INTRAVENOUS

## 2013-02-06 MED ORDER — ALBUMIN HUMAN 5 % IV SOLN
250.0000 mL | INTRAVENOUS | Status: AC | PRN
  Administered 2013-02-06: 250 mL via INTRAVENOUS

## 2013-02-06 MED ORDER — SODIUM CHLORIDE 0.9 % IV SOLN: 250.0000 mL | INTRAVENOUS | Status: DC

## 2013-02-06 MED ORDER — SODIUM CHLORIDE 0.9 % IV SOLN
100.0000 [IU] | INTRAVENOUS | Status: DC | PRN
  Administered 2013-02-06: 2.8 [IU]/h via INTRAVENOUS

## 2013-02-06 MED ORDER — THROMBIN 20000 UNITS EX SOLR
CUTANEOUS | Status: AC
Start: 2013-02-06 — End: 2013-02-06
  Filled 2013-02-06: qty 20000

## 2013-02-06 MED ORDER — FENTANYL CITRATE 0.05 MG/ML IJ SOLN
INTRAMUSCULAR | Status: DC | PRN
  Administered 2013-02-06 (×4): 250 ug via INTRAVENOUS
  Administered 2013-02-06: 25 ug via INTRAVENOUS
  Administered 2013-02-06: 250 ug via INTRAVENOUS
  Administered 2013-02-06: 225 ug via INTRAVENOUS

## 2013-02-06 MED ORDER — DEXTROSE 5 % IV SOLN
1.5000 g | Freq: Two times a day (BID) | INTRAVENOUS | Status: AC
  Administered 2013-02-06 – 2013-02-08 (×4): 1.5 g via INTRAVENOUS
  Filled 2013-02-06 (×4): qty 1.5

## 2013-02-06 MED ORDER — PROPOFOL 10 MG/ML IV BOLUS
INTRAVENOUS | Status: AC
  Filled 2013-02-06: qty 20

## 2013-02-06 MED ORDER — METOPROLOL TARTRATE 12.5 MG HALF TABLET
12.5000 mg | ORAL_TABLET | Freq: Two times a day (BID) | ORAL | Status: DC
  Filled 2013-02-06 (×3): qty 1

## 2013-02-06 MED ORDER — VANCOMYCIN HCL IN DEXTROSE 1-5 GM/200ML-% IV SOLN
1000.0000 mg | Freq: Once | INTRAVENOUS | Status: AC
  Administered 2013-02-06: 1000 mg via INTRAVENOUS
  Filled 2013-02-06: qty 200

## 2013-02-06 MED ORDER — SODIUM CHLORIDE 0.9 % IV SOLN
INTRAVENOUS | Status: DC
  Administered 2013-02-06: 2.9 [IU]/h via INTRAVENOUS
  Filled 2013-02-06 (×2): qty 1

## 2013-02-06 MED ORDER — PHENYLEPHRINE 40 MCG/ML (10ML) SYRINGE FOR IV PUSH (FOR BLOOD PRESSURE SUPPORT)
PREFILLED_SYRINGE | INTRAVENOUS | Status: AC
  Filled 2013-02-06: qty 10

## 2013-02-06 MED ORDER — ROCURONIUM BROMIDE 50 MG/5ML IV SOLN
INTRAVENOUS | Status: AC
  Filled 2013-02-06: qty 1

## 2013-02-06 MED ORDER — PHENYLEPHRINE HCL 10 MG/ML IJ SOLN
20.0000 mg | INTRAMUSCULAR | Status: DC | PRN
  Administered 2013-02-06: 20 ug/min via INTRAVENOUS

## 2013-02-06 MED ORDER — NITROGLYCERIN IN D5W 200-5 MCG/ML-% IV SOLN: 0.0000 ug/min | INTRAVENOUS | Status: DC

## 2013-02-06 MED ORDER — NITROGLYCERIN IN D5W 200-5 MCG/ML-% IV SOLN
INTRAVENOUS | Status: DC | PRN
  Administered 2013-02-06: 16.6 ug/min via INTRAVENOUS

## 2013-02-06 MED ORDER — PHENYLEPHRINE HCL 10 MG/ML IJ SOLN
0.0000 ug/min | INTRAMUSCULAR | Status: DC
  Filled 2013-02-06: qty 2

## 2013-02-06 MED ORDER — SODIUM CHLORIDE 0.9 % IV SOLN
1.0000 g/h | Freq: Once | INTRAVENOUS | Status: DC
  Filled 2013-02-06: qty 20

## 2013-02-06 MED ORDER — ONDANSETRON HCL 4 MG/2ML IJ SOLN: 4.0000 mg | Freq: Once | INTRAMUSCULAR | Status: DC | PRN

## 2013-02-06 MED ORDER — SODIUM CHLORIDE 0.9 % IJ SOLN
3.0000 mL | Freq: Two times a day (BID) | INTRAMUSCULAR | Status: DC
  Administered 2013-02-07 – 2013-02-12 (×4): 3 mL via INTRAVENOUS

## 2013-02-06 MED ORDER — HYDROMORPHONE HCL PF 1 MG/ML IJ SOLN: 0.2500 mg | INTRAMUSCULAR | Status: DC | PRN

## 2013-02-06 MED ORDER — POTASSIUM CHLORIDE 10 MEQ/50ML IV SOLN
10.0000 meq | INTRAVENOUS | Status: AC
  Administered 2013-02-06 (×3): 10 meq via INTRAVENOUS

## 2013-02-06 MED ORDER — SODIUM CHLORIDE 0.9 % IV SOLN
10.0000 g | INTRAVENOUS | Status: DC | PRN
  Administered 2013-02-06: 5 g/h via INTRAVENOUS

## 2013-02-06 MED ORDER — ACETAMINOPHEN 160 MG/5ML PO SOLN: 1000.0000 mg | Freq: Four times a day (QID) | ORAL | Status: DC

## 2013-02-06 MED ORDER — MIDAZOLAM HCL 2 MG/2ML IJ SOLN: 2.0000 mg | INTRAMUSCULAR | Status: DC | PRN

## 2013-02-06 MED ORDER — METOPROLOL TARTRATE 25 MG/10 ML ORAL SUSPENSION
12.5000 mg | Freq: Two times a day (BID) | ORAL | Status: DC
  Filled 2013-02-06 (×3): qty 5

## 2013-02-06 MED ORDER — DEXMEDETOMIDINE HCL IN NACL 200 MCG/50ML IV SOLN
0.1000 ug/kg/h | INTRAVENOUS | Status: DC
  Filled 2013-02-06: qty 50

## 2013-02-06 MED ORDER — LACTATED RINGERS IV SOLN: INTRAVENOUS | Status: DC

## 2013-02-06 MED ORDER — FENTANYL CITRATE 0.05 MG/ML IJ SOLN
INTRAMUSCULAR | Status: AC
Start: 2013-02-06 — End: 2013-02-06
  Filled 2013-02-06: qty 5

## 2013-02-06 MED ORDER — MORPHINE SULFATE 2 MG/ML IJ SOLN
1.0000 mg | INTRAMUSCULAR | Status: AC | PRN
  Administered 2013-02-07: 4 mg via INTRAVENOUS
  Filled 2013-02-06 (×2): qty 2

## 2013-02-06 MED ORDER — BISACODYL 10 MG RE SUPP: 10.0000 mg | Freq: Every day | RECTAL | Status: DC

## 2013-02-06 MED ORDER — MIDAZOLAM HCL 5 MG/5ML IJ SOLN
INTRAMUSCULAR | Status: DC | PRN
  Administered 2013-02-06: 5 mg via INTRAVENOUS
  Administered 2013-02-06: 2 mg via INTRAVENOUS
  Administered 2013-02-06: 3 mg via INTRAVENOUS

## 2013-02-06 MED ORDER — HEPARIN SODIUM (PORCINE) 1000 UNIT/ML IJ SOLN
INTRAMUSCULAR | Status: AC
  Filled 2013-02-06: qty 1

## 2013-02-06 MED ORDER — OXYCODONE HCL 5 MG PO TABS
5.0000 mg | ORAL_TABLET | ORAL | Status: DC | PRN
  Administered 2013-02-07 – 2013-02-08 (×5): 10 mg via ORAL
  Administered 2013-02-08 – 2013-02-09 (×2): 5 mg via ORAL
  Administered 2013-02-09: 10 mg via ORAL
  Administered 2013-02-09 – 2013-02-10 (×3): 5 mg via ORAL
  Administered 2013-02-12 (×2): 10 mg via ORAL
  Filled 2013-02-06 (×2): qty 2
  Filled 2013-02-06 (×2): qty 1
  Filled 2013-02-06 (×3): qty 2
  Filled 2013-02-06: qty 1
  Filled 2013-02-06: qty 2
  Filled 2013-02-06: qty 1
  Filled 2013-02-06 (×2): qty 2
  Filled 2013-02-06: qty 1

## 2013-02-06 MED FILL — Magnesium Sulfate Inj 50%: INTRAMUSCULAR | Qty: 10 | Status: AC

## 2013-02-06 MED FILL — Nitroglycerin IV Soln 200 MCG/ML in D5W: INTRAVENOUS | Qty: 250 | Status: AC

## 2013-02-06 MED FILL — Heparin Sodium (Porcine) Inj 1000 Unit/ML: INTRAMUSCULAR | Qty: 30 | Status: AC

## 2013-02-06 MED FILL — Heparin Sodium (Porcine) 100 Unt/ML in Sodium Chloride 0.45%: INTRAMUSCULAR | Qty: 250 | Status: AC

## 2013-02-06 MED FILL — Dexmedetomidine HCl IV Soln 200 MCG/2ML: INTRAVENOUS | Qty: 2 | Status: AC

## 2013-02-06 MED FILL — Potassium Chloride Inj 2 mEq/ML: INTRAVENOUS | Qty: 40 | Status: AC

## 2013-02-06 SURGICAL SUPPLY — 102 items
ATTRACTOMAT 16X20 MAGNETIC DRP (DRAPES) ×2 IMPLANT
BAG DECANTER FOR FLEXI CONT (MISCELLANEOUS) ×2 IMPLANT
BANDAGE ELASTIC 4 VELCRO ST LF (GAUZE/BANDAGES/DRESSINGS) ×2 IMPLANT
BANDAGE ELASTIC 6 VELCRO ST LF (GAUZE/BANDAGES/DRESSINGS) ×2 IMPLANT
BANDAGE GAUZE ELAST BULKY 4 IN (GAUZE/BANDAGES/DRESSINGS) ×2 IMPLANT
BASKET HEART (ORDER IN 25'S) (MISCELLANEOUS) ×1
BASKET HEART (ORDER IN 25S) (MISCELLANEOUS) ×1 IMPLANT
BLADE STERNUM SYSTEM 6 (BLADE) ×2 IMPLANT
BNDG GAUZE ELAST 4 BULKY (GAUZE/BANDAGES/DRESSINGS) ×1 IMPLANT
CANISTER SUCTION 2500CC (MISCELLANEOUS) ×2 IMPLANT
CANNULA ARTERIAL NVNT 3/8 22FR (MISCELLANEOUS) ×1 IMPLANT
CANNULA VESSEL 3MM BLUNT TIP (CANNULA) ×3 IMPLANT
CARDIAC SUCTION (MISCELLANEOUS) ×2 IMPLANT
CATH ROBINSON RED A/P 18FR (CATHETERS) ×4 IMPLANT
CATH THORACIC 28FR (CATHETERS) ×2 IMPLANT
CATH THORACIC 36FR (CATHETERS) ×2 IMPLANT
CATH THORACIC 36FR RT ANG (CATHETERS) ×2 IMPLANT
CLIP TI MEDIUM 24 (CLIP) IMPLANT
CLIP TI WIDE RED SMALL 24 (CLIP) ×1 IMPLANT
COVER SURGICAL LIGHT HANDLE (MISCELLANEOUS) ×2 IMPLANT
CRADLE DONUT ADULT HEAD (MISCELLANEOUS) ×2 IMPLANT
DRAPE CARDIOVASCULAR INCISE (DRAPES) ×2
DRAPE SLUSH/WARMER DISC (DRAPES) ×2 IMPLANT
DRAPE SRG 135X102X78XABS (DRAPES) ×1 IMPLANT
DRSG COVADERM 4X14 (GAUZE/BANDAGES/DRESSINGS) ×2 IMPLANT
ELECT CAUTERY BLADE 6.4 (BLADE) ×2 IMPLANT
ELECT REM PT RETURN 9FT ADLT (ELECTROSURGICAL) ×4
ELECTRODE REM PT RTRN 9FT ADLT (ELECTROSURGICAL) ×2 IMPLANT
GAUZE SPONGE 4X4 16PLY XRAY LF (GAUZE/BANDAGES/DRESSINGS) ×1 IMPLANT
GLOVE BIO SURGEON STRL SZ 6 (GLOVE) ×3 IMPLANT
GLOVE BIO SURGEON STRL SZ 6.5 (GLOVE) ×4 IMPLANT
GLOVE BIO SURGEON STRL SZ7 (GLOVE) ×1 IMPLANT
GLOVE BIO SURGEON STRL SZ7.5 (GLOVE) ×2 IMPLANT
GLOVE BIOGEL PI IND STRL 6 (GLOVE) IMPLANT
GLOVE BIOGEL PI IND STRL 6.5 (GLOVE) IMPLANT
GLOVE BIOGEL PI IND STRL 7.0 (GLOVE) IMPLANT
GLOVE BIOGEL PI INDICATOR 6 (GLOVE)
GLOVE BIOGEL PI INDICATOR 6.5 (GLOVE)
GLOVE BIOGEL PI INDICATOR 7.0 (GLOVE)
GLOVE EUDERMIC 7 POWDERFREE (GLOVE) ×4 IMPLANT
GLOVE ORTHO TXT STRL SZ7.5 (GLOVE) IMPLANT
GOWN PREVENTION PLUS XLARGE (GOWN DISPOSABLE) ×3 IMPLANT
GOWN STRL NON-REIN LRG LVL3 (GOWN DISPOSABLE) ×10 IMPLANT
HEMOSTAT POWDER SURGIFOAM 1G (HEMOSTASIS) ×6 IMPLANT
HEMOSTAT SURGICEL 2X14 (HEMOSTASIS) ×2 IMPLANT
INSERT FOGARTY 61MM (MISCELLANEOUS) ×1 IMPLANT
INSERT FOGARTY XLG (MISCELLANEOUS) IMPLANT
KIT BASIN OR (CUSTOM PROCEDURE TRAY) ×2 IMPLANT
KIT CATH CPB BARTLE (MISCELLANEOUS) ×2 IMPLANT
KIT ROOM TURNOVER OR (KITS) ×2 IMPLANT
KIT SUCTION CATH 14FR (SUCTIONS) ×2 IMPLANT
KIT VASOVIEW W/TROCAR VH 2000 (KITS) ×2 IMPLANT
NS IRRIG 1000ML POUR BTL (IV SOLUTION) ×11 IMPLANT
PACK OPEN HEART (CUSTOM PROCEDURE TRAY) ×2 IMPLANT
PAD ARMBOARD 7.5X6 YLW CONV (MISCELLANEOUS) ×4 IMPLANT
PAD ELECT DEFIB RADIOL ZOLL (MISCELLANEOUS) ×2 IMPLANT
PENCIL BUTTON HOLSTER BLD 10FT (ELECTRODE) ×2 IMPLANT
PUNCH AORTIC ROTATE 4.0MM (MISCELLANEOUS) IMPLANT
PUNCH AORTIC ROTATE 4.5MM 8IN (MISCELLANEOUS) ×2 IMPLANT
PUNCH AORTIC ROTATE 5MM 8IN (MISCELLANEOUS) IMPLANT
SET CARDIOPLEGIA MPS 5001102 (MISCELLANEOUS) ×1 IMPLANT
SPONGE GAUZE 4X4 12PLY (GAUZE/BANDAGES/DRESSINGS) ×4 IMPLANT
SPONGE GAUZE 4X4 12PLY STER LF (GAUZE/BANDAGES/DRESSINGS) ×1 IMPLANT
SPONGE INTESTINAL PEANUT (DISPOSABLE) IMPLANT
SPONGE LAP 18X18 X RAY DECT (DISPOSABLE) ×2 IMPLANT
SPONGE LAP 4X18 X RAY DECT (DISPOSABLE) ×2 IMPLANT
SUT BONE WAX W31G (SUTURE) ×2 IMPLANT
SUT MNCRL AB 4-0 PS2 18 (SUTURE) IMPLANT
SUT PROLENE 3 0 SH DA (SUTURE) IMPLANT
SUT PROLENE 3 0 SH1 36 (SUTURE) ×2 IMPLANT
SUT PROLENE 4 0 RB 1 (SUTURE)
SUT PROLENE 4 0 SH DA (SUTURE) IMPLANT
SUT PROLENE 4-0 RB1 .5 CRCL 36 (SUTURE) IMPLANT
SUT PROLENE 5 0 C 1 36 (SUTURE) IMPLANT
SUT PROLENE 6 0 C 1 30 (SUTURE) IMPLANT
SUT PROLENE 7 0 BV 1 (SUTURE) IMPLANT
SUT PROLENE 7 0 BV1 MDA (SUTURE) ×4 IMPLANT
SUT PROLENE 8 0 BV175 6 (SUTURE) IMPLANT
SUT SILK  1 MH (SUTURE)
SUT SILK 1 MH (SUTURE) IMPLANT
SUT STEEL STERNAL CCS#1 18IN (SUTURE) IMPLANT
SUT STEEL SZ 6 DBL 3X14 BALL (SUTURE) ×3 IMPLANT
SUT VIC AB 1 CTX 36 (SUTURE) ×4
SUT VIC AB 1 CTX36XBRD ANBCTR (SUTURE) ×2 IMPLANT
SUT VIC AB 2-0 CT1 27 (SUTURE)
SUT VIC AB 2-0 CT1 36 (SUTURE) ×1 IMPLANT
SUT VIC AB 2-0 CT1 TAPERPNT 27 (SUTURE) IMPLANT
SUT VIC AB 2-0 CTX 27 (SUTURE) IMPLANT
SUT VIC AB 3-0 SH 27 (SUTURE)
SUT VIC AB 3-0 SH 27X BRD (SUTURE) IMPLANT
SUT VIC AB 3-0 X1 27 (SUTURE) ×1 IMPLANT
SUT VICRYL 4-0 PS2 18IN ABS (SUTURE) IMPLANT
SUTURE E-PAK OPEN HEART (SUTURE) ×2 IMPLANT
SYSTEM SAHARA CHEST DRAIN ATS (WOUND CARE) ×2 IMPLANT
TAPE CLOTH SURG 4X10 WHT LF (GAUZE/BANDAGES/DRESSINGS) ×1 IMPLANT
TAPE PAPER 2X10 WHT MICROPORE (GAUZE/BANDAGES/DRESSINGS) ×1 IMPLANT
TOWEL OR 17X24 6PK STRL BLUE (TOWEL DISPOSABLE) ×2 IMPLANT
TOWEL OR 17X26 10 PK STRL BLUE (TOWEL DISPOSABLE) ×2 IMPLANT
TRAY FOLEY IC TEMP SENS 14FR (CATHETERS) ×2 IMPLANT
TUBING INSUFFLATION 10FT LAP (TUBING) ×2 IMPLANT
UNDERPAD 30X30 INCONTINENT (UNDERPADS AND DIAPERS) ×2 IMPLANT
WATER STERILE IRR 1000ML POUR (IV SOLUTION) ×4 IMPLANT

## 2013-02-06 NOTE — Progress Notes (Signed)
Echocardiogram Echocardiogram Transesophageal has been performed.  Joelene Millin 02/06/2013, 10:55 AM

## 2013-02-06 NOTE — OR Nursing (Signed)
13:05 - 1st call to SICU charge nurse, 13:35 - 2nd call to SICU.

## 2013-02-06 NOTE — Anesthesia Procedure Notes (Signed)
Procedure Name: Intubation Date/Time: 02/06/2013 9:20 AM Performed by: Carney Living Pre-anesthesia Checklist: Patient identified, Emergency Drugs available, Suction available, Patient being monitored and Timeout performed Patient Re-evaluated:Patient Re-evaluated prior to inductionOxygen Delivery Method: Circle system utilized Preoxygenation: Pre-oxygenation with 100% oxygen Intubation Type: IV induction Laryngoscope Size: Mac and 4 Grade View: Grade II Tube type: Oral Tube size: 8.0 mm Number of attempts: 1 Airway Equipment and Method: Stylet Placement Confirmation: ETT inserted through vocal cords under direct vision,  positive ETCO2 and breath sounds checked- equal and bilateral Secured at: 22 cm Tube secured with: Tape Dental Injury: Teeth and Oropharynx as per pre-operative assessment

## 2013-02-06 NOTE — Progress Notes (Signed)
Dr Cyndia Bent updated pt status. Pt on cpap x67min, updated cpap abg results. Updated pt on 9mcg neo and insulin gtt. Updated pt febrile with temp >100, pt with allergy to tylenol, scheduled tylenol held. MD ok to stop scheduled tylenol. MD ok to proceed with extubation. Reather Laurence

## 2013-02-06 NOTE — Anesthesia Preprocedure Evaluation (Addendum)
Anesthesia Evaluation  Patient identified by MRN, date of birth, ID band Patient awake    Reviewed: Allergy & Precautions, H&P , NPO status , Patient's Chart, lab work & pertinent test results  Airway Mallampati: III TM Distance: >3 FB Neck ROM: Full    Dental  (+) Edentulous Lower and Edentulous Upper   Pulmonary COPD COPD inhaler, former smoker,    + decreased breath sounds      Cardiovascular hypertension, Pt. on medications + CAD and + Past MI Rhythm:Regular Rate:Normal     Neuro/Psych Anxiety Depression    GI/Hepatic GERD-  Medicated and Controlled,  Endo/Other  diabetes, Type 2, Oral Hypoglycemic AgentsMorbid obesity  Renal/GU      Musculoskeletal   Abdominal (+) + obese,   Peds  Hematology   Anesthesia Other Findings EF 30%  Reproductive/Obstetrics                        Anesthesia Physical Anesthesia Plan  ASA: IV  Anesthesia Plan: General   Post-op Pain Management:    Induction: Intravenous  Airway Management Planned: Oral ETT  Additional Equipment: Arterial line, CVP, PA Cath and TEE  Intra-op Plan:   Post-operative Plan: Post-operative intubation/ventilation  Informed Consent: I have reviewed the patients History and Physical, chart, labs and discussed the procedure including the risks, benefits and alternatives for the proposed anesthesia with the patient or authorized representative who has indicated his/her understanding and acceptance.   Dental advisory given  Plan Discussed with: CRNA, Anesthesiologist and Surgeon  Anesthesia Plan Comments:         Anesthesia Quick Evaluation

## 2013-02-06 NOTE — Op Note (Signed)
CARDIOVASCULAR SURGERY OPERATIVE NOTE  02/06/2013  Surgeon:  [name], MD  First Assistant: Jadene Pierini, [facility]   Preoperative Diagnosis:  Severe multi-vessel coronary artery disease, s/p NSTEMI.   Postoperative Diagnosis:  Same   Procedure:  1. Median Sternotomy 2. Extracorporeal circulation 3.   Coronary artery bypass grafting x 3   Left internal mammary graft to the LAD  SVG to OM  SVG to RCA  4.   Endoscopic vein harvest from the right leg   Anesthesia:  General Endotracheal   Clinical History/Surgical Indication:  The patient is a 53 year old gentleman with uncontrolled DM due to noncompliance, hyperlipidemia, hypertension, COPD, chronic anxiety and a hx of coronary disease s/p MI in the past. He reports developing left arm pain in church last Sunday that waxed and waned and then on Wednesday it became continuous and severe prompting a visit to [facility] ER. His ECG was abnormal and troponin was elevated so he was transferred to [facility] for further workup. He underwent cath yesterday that shows severe 3-vessel CAD with the culprit appearing to be a 90% mid RCA stenosis that looks thrombotic. His EF was 30%. agree that CABG is indicated for this patient with diabetes and severe 3-vessel CAD. I discussed the operative procedure with the patient including alternatives, benefits and risks; including but not limited to bleeding, blood transfusion, infection, stroke, myocardial infarction, graft failure, heart block requiring a permanent pacemaker, organ dysfunction, and death.  Manuel Knight understands and agrees to proceed.     Preparation:  The patient was seen in the preoperative holding area and the correct patient, correct operation were confirmed with the patient after reviewing the medical record and catheterization. The consent was signed by me. Preoperative antibiotics were  given. A pulmonary arterial line and radial arterial line were placed by the anesthesia team. The patient was taken back to the operating room and positioned supine on the operating room table. After being placed under general endotracheal anesthesia by the anesthesia team a foley catheter was placed. The neck, chest, abdomen, and both legs were prepped with betadine soap and solution and draped in the usual sterile manner. A surgical time-out was taken and the correct patient and operative procedure were confirmed with the nursing and anesthesia staff.  TEE:  Performed by Dr. Roberts Gaudy. This showed moderate LV dysfunction with inferior akinesis and anterior severe hypokinesis. There was no MR, no AS or AI. RV function appeared moderately depressed. Post-bypass LV function was markedly improved with recovery of inferior contractility and improved anterior contractility.  Cardiopulmonary Bypass:  A median sternotomy was performed. The pericardium was opened in the midline. Right ventricular function appeared normal. The ascending aorta was of normal size and had no palpable plaque. There were no contraindications to aortic cannulation or cross-clamping. The patient was fully systemically heparinized and the ACT was maintained > 400 sec. The proximal aortic arch was cannulated with a 2 F aortic cannula for arterial  inflow. Venous cannulation was performed via the right atrial appendage using a two-staged venous cannula. An antegrade cardioplegia/vent cannula was inserted into the mid-ascending aorta. Aortic occlusion was performed with a single cross-clamp. Systemic cooling to 32 degrees Centigrade and topical cooling of the heart with iced saline were used. Hyperkalemic antegrade cold blood cardioplegia was used to induce diastolic arrest and was then given at about 20 minute intervals throughout the period of arrest to maintain myocardial temperature at or below 10 degrees centigrade. A temperature probe  was inserted into the interventricular septum and an insulating pad was placed in the pericardium.   Left internal mammary harvest:  The left side of the sternum was retracted using the Rultract retractor. The left internal mammary artery was harvested as a pedicle graft. All side branches were clipped. It was a medium-sized vessel of good quality with excellent blood flow. It was ligated distally and divided. It was sprayed with topical papaverine solution to prevent vasospasm.   Endoscopic vein harvest:  The right greater saphenous vein was harvested endoscopically through a 2 cm incision medial to the right knee. It was harvested from the upper thigh to below the knee. It was a medium-sized vein of good quality. The side branches were all ligated with 4-0 silk ties.    Coronary arteries:  The coronary arteries were examined.   LAD:  Moderate-sized vessel with moderate diffuse disease. The first diagonal was small and diffusely diseased and not graftable.  LCX:  The OM was visible in the mid portion just distal to the stenosis and had minimal disease there. It then became intramyocardial beneath a large epicardial vein and the remainder of the vessel was not accessible.  RCA:  Diffusely diseased throughout the proximal and mid-portion. The distal portion at the takeoff of the PDA was free of disease. The PDA had no disease. The first PL was small and diffusely diseased and not graftable. The second PL was small but free of disease.   Grafts:  1. LIMA to the LAD: 1.75 mm. It was sewn end to side using 8-0 prolene continuous suture. 2. SVG to OM:  1.6 mm. It was sewn end to side using 7-0 prolene continuous suture. 3. SVG to RCA 2.0 mm. It was sewn end to side using 7-0 prolene continuous suture.   The proximal vein graft anastomoses were performed to the mid-ascending aorta using continuous 6-0 prolene suture. Graft markers were placed around the proximal  anastomoses.   Completion:  The patient was rewarmed to 37 degrees Centigrade. The clamp was removed from the LIMA pedicle and there was rapid warming of the septum and return of ventricular fibrillation. The crossclamp was removed with a time of 69 minutes. There was spontaneous return of sinus rhythm. The distal and proximal anastomoses were checked for hemostasis. The position of the grafts was satisfactory. Two temporary epicardial pacing wires were placed on the right atrium and two on the right ventricle. The patient was weaned from CPB without difficulty on dopamine 5 mcg/kg/min CPB time was 91 minutes. Cardiac output was 8 LPM. The dopamine was quickly weaned. Heparin was fully reversed with protamine and the aortic and venous cannulas removed. Hemostasis was achieved. Mediastinal and left pleural drainage tubes were placed. The sternum was closed with double #6 stainless steel wires. The fascia was closed with continuous # 1 vicryl suture. The subcutaneous tissue was closed with 2-0 vicryl continuous suture. The skin was closed with 3-0 vicryl subcuticular suture. All sponge, needle, and  instrument counts were reported correct at the end of the case. Dry sterile dressings were placed over the incisions and around the chest tubes which were connected to pleurevac suction. The patient was then transported to the surgical intensive care unit in critical but stable condition.

## 2013-02-06 NOTE — Progress Notes (Signed)
Utilization Review Completed.Melissia Lahman T12/06/2013  

## 2013-02-06 NOTE — Progress Notes (Signed)
Dr Servando Snare updated post extubation ABG. MD updated pt febrile, temp >101, pt with tylenol allergy, will continue to monitor. Reather Laurence

## 2013-02-06 NOTE — Progress Notes (Signed)
I agree with the assessment performed by Fredderick Severance, student nurse.

## 2013-02-06 NOTE — CV Procedure (Signed)
Intra-operative Transesophageal Echocardiography Report:  [name] is a 53 year old male with uncontrolled diabetes, hyperlipidemia, hypertension, COPD, and a history of coronary artery disease with previous MI. He was admitted to [facility] on January 21 with a three-day history of left arm pain and he ruled in for a non-STEMI. Subsequent cardiac catheterization revealed severe three-vessel coronary disease with 90% mid RCA stenosis that appeared thrombotic. Ejection fraction was 30%. He is now scheduled to undergo coronary artery bypass grafting by Dr. Cyndia Bent. Intraoperative transesophageal echocardiography was requested to evaluate the left and right ventricular function, to serve as a monitor for intraoperative volume status, and to determine if any valvular pathology was present.  The patient was brought to the operating room at [facility] and general anesthesia was induced without difficulty. Following induction of general anesthesia and uneventful endotracheal intubation, the transesophageal echocardiography probe was inserted into the esophagus without difficulty.  Impression: Pre-bypass findings:  1. Aortic valve: The aortic valve was trileaflet. The leaflets were thin and pliable and open normally with no aortic insufficiency.  2. Mitral valve: The mitral leaflets are thickened. There were no prolapsing or flail segments noted. There was lateral displacement of the papillary muscles with an apically displaced coaptation point with a central jet of mitral regurgitation which was graded as 2+.  3. Left ventricle: There was severe left ventricular dysfunction. The left ventricular cavity was enlarged. The LV end-diastolic diameter measured 6.3 mm. There was moderate left ventricular hypertrophy with left ventricular wall thickness measuring 1.15-1.25 cm. There was dyskinesis of the mid-to distal inferior wall and inferior septum. There was severe hypokinesis of the  apex, mid-anterior wall and  Mid-anterior septum. The lateral wall was hypokinetic in the apical region but appeared to have normal basilar contractility. The ejection fraction was estimated at 25%. There were no obvious thinning thinned segments. There was no thrombus noted in the left ventricular apex.  4. Right ventricle: The right ventricular cavity did not appear enlarged. However there was moderate  right ventricular dysfunction. There was hypokinesis of the right ventricular free wall and mildly reduced basilar right ventricular contractility.  5. Inter- atrial septum. The interatrial septum was intact without evidence of patent foramen ovale or atrial septal defect by color Doppler and bubble study.  6. Tricuspid Valve: The tricuspid leaflets appeared structurally normal and there was 2+ tricuspid insufficiency.  7. Left atrium. There was no thrombus noted in the left atrium or left atrial appendage.  8. Descending aorta: The ascending aorta was not dilated or aneurysmal. There was no significant atheromatous disease appreciated.  9. Descending aorta: The descending aorta showed scattered  grade 1-2 atheromatous disease.  Post-bypass findings:  1. Aortic valve: The aortic valve was unchanged from the pre-bypass study. The leaflets opened normally and there was no aortic insufficiency.  2. Mitral valve: There was improved coaptation of the mitral leaflets and there was 1-2+ mitral regurgitation.  3. Left ventricle: There was marked improvement in left ventricular function. The inferior wall and inferoseptum which he previously appeared dyskinetic wall appeared to contract. There appeared to be improved contractility of the anterior wall and anterior septum is well. The ejection fraction was estimated at 40-45%.  4. Right ventricle: Right ventricular function appeared markedly improved. There was increased contractility the right ventricular free wall and improved lateral tricuspid  annular systolic excursion.  5 Tricuspid valve: There was 1+ tricuspid insufficiency.  Roberts Gaudy, M.D.

## 2013-02-06 NOTE — Transfer of Care (Signed)
Immediate Anesthesia Transfer of Care Note  Patient: Manuel Knight  Procedure(s) Performed: Procedure(s) with comments: CORONARY ARTERY BYPASS GRAFTING (CABG) with TEE (N/A) - CABG x three,  using left internal mammary artery and right leg greater saphenous vein harvested endoscopically  Patient Location: SICU  Anesthesia Type:General  Level of Consciousness: sedated and unresponsive  Airway & Oxygen Therapy: Patient remains intubated per anesthesia plan and Patient placed on Ventilator (see vital sign flow sheet for setting)  Post-op Assessment: Post -op Vital signs reviewed and stable and Report given to [place], RN  Post vital signs: Reviewed and stable  Complications: No apparent anesthesia complications

## 2013-02-06 NOTE — Progress Notes (Signed)
Patient ID: Hyde Sires, male   DOB: Jun 14, 1960, 53 y.o.   MRN: [id number] EVENING ROUNDS NOTE :     [address]       [place] [address]             [phone]                 Day of Surgery Procedure(s) (LRB): CORONARY ARTERY BYPASS GRAFTING (CABG) with TEE (N/A)  Total Length of Stay:  LOS: 3 days  BP 172/72  Pulse 96  Temp(Src) 101.1 F (38.4 C) (Core (Comment))  Resp 24  Ht 5\' 9"  (1.753 m)  Wt 266 lb 12.1 oz (121 kg)  BMI 39.38 kg/m2  SpO2 99%  .Intake/Output     01/25 0701 - 01/26 0700 01/26 0701 - 01/27 0700   P.O. 900    I.V. (mL/kg) 630 (5.2) 2985 (24.7)   Blood  565   NG/GT  30   IV Piggyback  1010   Total Intake(mL/kg) 1530 (12.6) 4590 (37.9)   Urine (mL/kg/hr) 1900 (0.7) 2470 (1.7)   Emesis/NG output  50 (0)   Blood  1000 (0.7)   Chest Tube  70 (0)   Total Output 1900 3590   Net -370 +1000          . sodium chloride 20 mL/hr at 02/06/13 1500  . sodium chloride    . [START ON 02/07/2013] sodium chloride    . dexmedetomidine Stopped (02/06/13 1730)  . DOPamine Stopped (02/06/13 1500)  . insulin (NOVOLIN-R) infusion 2.9 Units/hr (02/06/13 1711)  . lactated ringers 20 mL/hr (02/06/13 1430)  . nitroGLYCERIN 25 mcg/min (02/06/13 1430)  . phenylephrine (NEO-SYNEPHRINE) Adult infusion 10 mcg/min (02/06/13 1700)     Lab Results  Component Value Date   WBC 14.8* 02/06/2013   HGB 17.3* 02/06/2013   HCT 51.0 02/06/2013   PLT 171 02/06/2013   GLUCOSE 149* 02/06/2013   CHOL 205* 02/04/2013   TRIG 229* 02/04/2013   HDL 26* 02/04/2013   LDLDIRECT 137* 07/03/2009   LDLCALC 133* 02/04/2013   ALT 24 02/04/2013   AST 37 02/04/2013   NA 140 02/06/2013   K 3.8 02/06/2013   CL 99 02/06/2013   CREATININE 1.11 02/06/2013   BUN 26* 02/06/2013   CO2 26 02/06/2013   TSH 3.348 05/22/2011   PSA 0.38 01/25/2009   INR 1.59* 02/06/2013   HGBA1C 12.2* 05/22/2011   MICROALBUR 0.50 01/25/2009   Now extubated Not bleeding Insulin drip now on ,now off  dopamine  Grace Isaac MD  Beeper [phone] Office [phone] 02/06/2013 6:41 PM

## 2013-02-06 NOTE — Procedures (Signed)
**Note De-Identified Daleisa Halperin Obfuscation** Extubation Procedure Note  Patient Details:   Name: Audrick Lamoureaux DOB: [birth year] MRN: [id number]   Airway Documentation:     Evaluation2  O2 sats: stable throughout Complications: No apparent complications Patient did tolerate procedure well. Bilateral Breath Sounds: Diminished   Yes No stridor noted, + leak  VC .677ml -NIF -26 Catlin Aycock, Penni Bombard 02/06/2013, 6:17 PM

## 2013-02-06 NOTE — Preoperative (Signed)
Beta Blockers   Reason not to administer Beta Blockers:Not Applicable 

## 2013-02-06 NOTE — Brief Op Note (Signed)
      [address]       [place] [address]             [phone]     02/03/2013 - 02/06/2013  12:31 PM  PATIENT:  [name]  53 y.o. male  PRE-OPERATIVE DIAGNOSIS:  coronary artery disease  POST-OPERATIVE DIAGNOSIS:  coronary artery disease  PROCEDURE:  Procedure(s): CORONARY ARTERY BYPASS GRAFTING (CABG) x3 LIMA-LAD; SVG-OM; SVG-RCA RVH RIGHT LEG TEE SURGEON:  Surgeon(s): [name], MD  PHYSICIAN ASSISTANT: Minaal Struckman PA-C  ANESTHESIA:   general  PATIENT CONDITION:  ICU - intubated and hemodynamically stable.  PRE-OPERATIVE WEIGHT: [id number]  COMPLICATIONS: NO KNOWN

## 2013-02-07 ENCOUNTER — Inpatient Hospital Stay (HOSPITAL_COMMUNITY): Payer: Medicare Other

## 2013-02-07 ENCOUNTER — Encounter (HOSPITAL_COMMUNITY): Payer: Self-pay | Admitting: Surgery

## 2013-02-07 LAB — GLUCOSE, CAPILLARY
GLUCOSE-CAPILLARY: 101 mg/dL — AB (ref 70–99)
GLUCOSE-CAPILLARY: 104 mg/dL — AB (ref 70–99)
GLUCOSE-CAPILLARY: 105 mg/dL — AB (ref 70–99)
GLUCOSE-CAPILLARY: 112 mg/dL — AB (ref 70–99)
GLUCOSE-CAPILLARY: 112 mg/dL — AB (ref 70–99)
GLUCOSE-CAPILLARY: 113 mg/dL — AB (ref 70–99)
GLUCOSE-CAPILLARY: 114 mg/dL — AB (ref 70–99)
GLUCOSE-CAPILLARY: 128 mg/dL — AB (ref 70–99)
GLUCOSE-CAPILLARY: 137 mg/dL — AB (ref 70–99)
GLUCOSE-CAPILLARY: 180 mg/dL — AB (ref 70–99)
Glucose-Capillary: 102 mg/dL — ABNORMAL HIGH (ref 70–99)
Glucose-Capillary: 137 mg/dL — ABNORMAL HIGH (ref 70–99)
Glucose-Capillary: 111 mg/dL — ABNORMAL HIGH (ref 70–99)
Glucose-Capillary: 103 mg/dL — ABNORMAL HIGH (ref 70–99)
Glucose-Capillary: 106 mg/dL — ABNORMAL HIGH (ref 70–99)
Glucose-Capillary: 100 mg/dL — ABNORMAL HIGH (ref 70–99)
Glucose-Capillary: 106 mg/dL — ABNORMAL HIGH (ref 70–99)
Glucose-Capillary: 97 mg/dL (ref 70–99)
Glucose-Capillary: 106 mg/dL — ABNORMAL HIGH (ref 70–99)

## 2013-02-07 LAB — CBC
HCT: 35.7 % — ABNORMAL LOW (ref 39.0–52.0)
HCT: 37.8 % — ABNORMAL LOW (ref 39.0–52.0)
Hemoglobin: 11.9 g/dL — ABNORMAL LOW (ref 13.0–17.0)
Hemoglobin: 12.4 g/dL — ABNORMAL LOW (ref 13.0–17.0)
MCH: 30.7 pg (ref 26.0–34.0)
MCH: 30.8 pg (ref 26.0–34.0)
MCHC: 33.3 g/dL (ref 30.0–36.0)
MCHC: 32.8 g/dL (ref 30.0–36.0)
MCV: 92 fL (ref 78.0–100.0)
MCV: 93.8 fL (ref 78.0–100.0)
PLATELETS: 205 10*3/uL (ref 150–400)
Platelets: 212 10*3/uL (ref 150–400)
RBC: 3.88 MIL/uL — AB (ref 4.22–5.81)
RBC: 4.03 MIL/uL — ABNORMAL LOW (ref 4.22–5.81)
RDW: 14.2 % (ref 11.5–15.5)
RDW: 14.3 % (ref 11.5–15.5)
WBC: 15 10*3/uL — AB (ref 4.0–10.5)
WBC: 17.3 10*3/uL — ABNORMAL HIGH (ref 4.0–10.5)

## 2013-02-07 LAB — POCT I-STAT, CHEM 8
BUN: 18 mg/dL (ref 6–23)
Calcium, Ion: 1.25 mmol/L — ABNORMAL HIGH (ref 1.12–1.23)
Chloride: 96 mEq/L (ref 96–112)
Creatinine, Ser: 1 mg/dL (ref 0.50–1.35)
Glucose, Bld: 149 mg/dL — ABNORMAL HIGH (ref 70–99)
HEMATOCRIT: 40 % (ref 39.0–52.0)
Hemoglobin: 13.6 g/dL (ref 13.0–17.0)
Potassium: 4.5 mEq/L (ref 3.7–5.3)
SODIUM: 137 meq/L (ref 137–147)
TCO2: 28 mmol/L (ref 0–100)

## 2013-02-07 LAB — CREATININE, SERUM
Creatinine, Ser: 0.88 mg/dL (ref 0.50–1.35)
GFR calc Af Amer: >90 mL/min (ref >90)
GFR calc non Af Amer: >90 mL/min (ref >90)

## 2013-02-07 LAB — BASIC METABOLIC PANEL
BUN: 16 mg/dL (ref 6–23)
CO2: 24 mEq/L (ref 19–32)
CREATININE: 0.83 mg/dL (ref 0.50–1.35)
Calcium: 8.1 mg/dL — ABNORMAL LOW (ref 8.4–10.5)
Chloride: 104 mEq/L (ref 96–112)
GFR calc non Af Amer: >90 mL/min (ref >90)
Glucose, Bld: 105 mg/dL — ABNORMAL HIGH (ref 70–99)
POTASSIUM: 4.4 meq/L (ref 3.7–5.3)
Sodium: 140 mEq/L (ref 137–147)

## 2013-02-07 LAB — MAGNESIUM
MAGNESIUM: 2 mg/dL (ref 1.5–2.5)
Magnesium: 2 mg/dL (ref 1.5–2.5)

## 2013-02-07 MED ORDER — INSULIN DETEMIR 100 UNIT/ML ~~LOC~~ SOLN
30.0000 [IU] | Freq: Two times a day (BID) | SUBCUTANEOUS | Status: DC
  Filled 2013-02-07 (×4): qty 0.3

## 2013-02-07 MED ORDER — INSULIN ASPART 100 UNIT/ML ~~LOC~~ SOLN
0.0000 [IU] | SUBCUTANEOUS | Status: DC
  Administered 2013-02-07 (×3): 2 [IU] via SUBCUTANEOUS
  Administered 2013-02-08: 8 [IU] via SUBCUTANEOUS
  Administered 2013-02-08: 4 [IU] via SUBCUTANEOUS
  Administered 2013-02-08 (×3): 2 [IU] via SUBCUTANEOUS
  Administered 2013-02-08: 4 [IU] via SUBCUTANEOUS
  Administered 2013-02-09 (×2): 2 [IU] via SUBCUTANEOUS

## 2013-02-07 MED ORDER — FUROSEMIDE 10 MG/ML IJ SOLN
40.0000 mg | Freq: Two times a day (BID) | INTRAMUSCULAR | Status: AC
  Administered 2013-02-07 (×2): 40 mg via INTRAVENOUS
  Filled 2013-02-07: qty 4

## 2013-02-07 MED ORDER — ENOXAPARIN SODIUM 40 MG/0.4ML ~~LOC~~ SOLN
40.0000 mg | Freq: Every day | SUBCUTANEOUS | Status: DC
  Administered 2013-02-07 – 2013-02-12 (×6): 40 mg via SUBCUTANEOUS
  Filled 2013-02-07 (×7): qty 0.4

## 2013-02-07 MED ORDER — METOPROLOL TARTRATE 25 MG/10 ML ORAL SUSPENSION
25.0000 mg | Freq: Two times a day (BID) | ORAL | Status: DC
  Administered 2013-02-10: 25 mg
  Filled 2013-02-07 (×10): qty 10

## 2013-02-07 MED ORDER — METOPROLOL TARTRATE 25 MG PO TABS
25.0000 mg | ORAL_TABLET | Freq: Two times a day (BID) | ORAL | Status: DC
  Administered 2013-02-07 – 2013-02-10 (×7): 25 mg via ORAL
  Filled 2013-02-07 (×10): qty 1

## 2013-02-07 MED ORDER — INSULIN DETEMIR 100 UNIT/ML ~~LOC~~ SOLN
30.0000 [IU] | Freq: Two times a day (BID) | SUBCUTANEOUS | Status: DC
  Administered 2013-02-07 (×2): 30 [IU] via SUBCUTANEOUS
  Filled 2013-02-07 (×4): qty 0.3

## 2013-02-07 MED FILL — Lidocaine HCl IV Inj 20 MG/ML: INTRAVENOUS | Qty: 5 | Status: AC

## 2013-02-07 MED FILL — Mannitol IV Soln 20%: INTRAVENOUS | Qty: 500 | Status: AC

## 2013-02-07 MED FILL — Electrolyte-R (PH 7.4) Solution: INTRAVENOUS | Qty: 3000 | Status: AC

## 2013-02-07 MED FILL — Heparin Sodium (Porcine) Inj 1000 Unit/ML: INTRAMUSCULAR | Qty: 20 | Status: AC

## 2013-02-07 MED FILL — Sodium Chloride IV Soln 0.9%: INTRAVENOUS | Qty: 3000 | Status: AC

## 2013-02-07 MED FILL — Heparin Sodium (Porcine) Inj 1000 Unit/ML: INTRAMUSCULAR | Qty: 30 | Status: AC

## 2013-02-07 MED FILL — Sodium Bicarbonate IV Soln 8.4%: INTRAVENOUS | Qty: 50 | Status: AC

## 2013-02-07 NOTE — Progress Notes (Signed)
TCTS BRIEF SICU PROGRESS NOTE  1 Day Post-Op  S/P Procedure(s) (LRB): CORONARY ARTERY BYPASS GRAFTING (CABG) with TEE (N/A)   Stable day NSR w/ stable BP O2 sats 95% on 2 L/min Diuresing well  Plan: Continue current plan  Tuyen Uncapher H 02/07/2013 9:18 PM

## 2013-02-07 NOTE — Progress Notes (Signed)
UR Completed.  Manuel Knight [phone] 12/18/2013  

## 2013-02-07 NOTE — Care Management Note (Signed)
    Page 1 of 1   02/07/2013     10:35:14 AM   CARE MANAGEMENT NOTE 02/07/2013  Patient:  Manuel Knight, Manuel Knight   Account Number:  [id number]  Date Initiated:  02/07/2013  Documentation initiated by:  Luz Lex  Subjective/Objective Assessment:   Admitted with NSTEMI - 3V CAD - now post op CABG.     Action/Plan:   Anticipated DC Date:  02/13/2013   Anticipated DC Plan:  [place]  CM consult      Choice offered to / List presented to:             Status of service:  In process, will continue to follow Medicare Important Message given?   (If response is "NO", the following Medicare IM given date fields will be blank) Date Medicare IM given:   Date Additional Medicare IM given:    Discharge Disposition:    Per UR Regulation:  Reviewed for med. necessity/level of care/duration of stay  If discussed at [place] of Stay Meetings, dates discussed:    Comments:  ContactDavis Gourd [phone]  02-07-13 10:30pm Luz Lex, [place] [phone] Patient laying in bed.  States plan for discharge home - independent prior.  States his wife, Coralyn Mark and friends will be with him 24/7 on discharge. CM will continue to follow.

## 2013-02-07 NOTE — Progress Notes (Signed)
Anesthesiology Follow-up:  Awake and alert, neuro intact, taking po liquids, hemodynamically stable.  VS: T-37.9 BP 120/75 HR 106 (SR) RR 25 O2 Sat 96% on 4L  K-4.4 BUN/Cr. 16/0.83  H/H: 11.9/35.7 Plts 205   Extubated 4 hours post-op. Doing well, POD #1 S/P CABG following NSTEMI 01/27/13  Roberts Gaudy, MD

## 2013-02-08 ENCOUNTER — Inpatient Hospital Stay (HOSPITAL_COMMUNITY): Payer: Medicare Other

## 2013-02-08 LAB — GLUCOSE, CAPILLARY
GLUCOSE-CAPILLARY: 172 mg/dL — AB (ref 70–99)
GLUCOSE-CAPILLARY: 153 mg/dL — AB (ref 70–99)
Glucose-Capillary: 138 mg/dL — ABNORMAL HIGH (ref 70–99)
Glucose-Capillary: 144 mg/dL — ABNORMAL HIGH (ref 70–99)
Glucose-Capillary: 145 mg/dL — ABNORMAL HIGH (ref 70–99)
Glucose-Capillary: 178 mg/dL — ABNORMAL HIGH (ref 70–99)

## 2013-02-08 LAB — CBC
HCT: 36.5 % — ABNORMAL LOW (ref 39.0–52.0)
Hemoglobin: 11.9 g/dL — ABNORMAL LOW (ref 13.0–17.0)
MCH: 30.4 pg (ref 26.0–34.0)
MCHC: 32.6 g/dL (ref 30.0–36.0)
MCV: 93.4 fL (ref 78.0–100.0)
PLATELETS: 205 10*3/uL (ref 150–400)
RBC: 3.91 MIL/uL — AB (ref 4.22–5.81)
RDW: 14.1 % (ref 11.5–15.5)
WBC: 15.8 10*3/uL — ABNORMAL HIGH (ref 4.0–10.5)

## 2013-02-08 LAB — BASIC METABOLIC PANEL
BUN: 17 mg/dL (ref 6–23)
CALCIUM: 8.7 mg/dL (ref 8.4–10.5)
CO2: 30 meq/L (ref 19–32)
CREATININE: 0.81 mg/dL (ref 0.50–1.35)
Chloride: 99 mEq/L (ref 96–112)
GFR calc Af Amer: >90 mL/min (ref >90)
Glucose, Bld: 154 mg/dL — ABNORMAL HIGH (ref 70–99)
Potassium: 4.7 mEq/L (ref 3.7–5.3)
SODIUM: 140 meq/L (ref 137–147)

## 2013-02-08 MED ORDER — MOVING RIGHT ALONG BOOK
Freq: Once | Status: AC
  Administered 2013-02-08: 10:00:00
  Filled 2013-02-08: qty 1

## 2013-02-08 MED ORDER — MUPIROCIN 2 % EX OINT
1.0000 "application " | TOPICAL_OINTMENT | Freq: Two times a day (BID) | CUTANEOUS | Status: AC
  Administered 2013-02-08 – 2013-02-12 (×10): 1 via NASAL
  Filled 2013-02-08: qty 22

## 2013-02-08 MED ORDER — FUROSEMIDE 40 MG PO TABS
40.0000 mg | ORAL_TABLET | Freq: Every day | ORAL | Status: DC
  Administered 2013-02-08: 40 mg via ORAL
  Filled 2013-02-08 (×2): qty 1

## 2013-02-08 MED ORDER — ALPRAZOLAM 0.5 MG PO TABS
2.0000 mg | ORAL_TABLET | Freq: Four times a day (QID) | ORAL | Status: DC
  Administered 2013-02-08: 2 mg via ORAL
  Filled 2013-02-08: qty 4

## 2013-02-08 MED ORDER — MOMETASONE FURO-FORMOTEROL FUM 200-5 MCG/ACT IN AERO
2.0000 | INHALATION_SPRAY | Freq: Two times a day (BID) | RESPIRATORY_TRACT | Status: DC
Start: 2013-02-08 — End: 2013-02-13
  Administered 2013-02-08 – 2013-02-13 (×11): 2 via RESPIRATORY_TRACT
  Filled 2013-02-08: qty 8.8

## 2013-02-08 MED ORDER — METFORMIN HCL 500 MG PO TABS
500.0000 mg | ORAL_TABLET | Freq: Two times a day (BID) | ORAL | Status: DC
  Administered 2013-02-08: 500 mg via ORAL
  Filled 2013-02-08 (×4): qty 1

## 2013-02-08 MED ORDER — ALPRAZOLAM 0.25 MG PO TABS
1.0000 mg | ORAL_TABLET | Freq: Four times a day (QID) | ORAL | Status: DC
  Administered 2013-02-08 – 2013-02-09 (×2): 1 mg via ORAL
  Filled 2013-02-08 (×2): qty 2

## 2013-02-08 MED ORDER — HYDROCOD POLST-CHLORPHEN POLST 10-8 MG/5ML PO LQCR
5.0000 mL | Freq: Two times a day (BID) | ORAL | Status: DC | PRN
  Administered 2013-02-08 – 2013-02-09 (×3): 5 mL via ORAL
  Filled 2013-02-08 (×4): qty 5

## 2013-02-08 MED ORDER — POTASSIUM CHLORIDE CRYS ER 20 MEQ PO TBCR
20.0000 meq | EXTENDED_RELEASE_TABLET | Freq: Every day | ORAL | Status: DC
  Filled 2013-02-08 (×2): qty 1

## 2013-02-08 MED ORDER — SODIUM CHLORIDE 0.9 % IV SOLN: 250.0000 mL | INTRAVENOUS | Status: DC | PRN

## 2013-02-08 MED ORDER — TIOTROPIUM BROMIDE MONOHYDRATE 18 MCG IN CAPS
18.0000 ug | ORAL_CAPSULE | Freq: Every day | RESPIRATORY_TRACT | Status: DC
  Administered 2013-02-08 – 2013-02-11 (×4): 18 ug via RESPIRATORY_TRACT
  Filled 2013-02-08 (×2): qty 5

## 2013-02-08 MED ORDER — SODIUM CHLORIDE 0.9 % IJ SOLN: 3.0000 mL | INTRAMUSCULAR | Status: DC | PRN

## 2013-02-08 MED ORDER — CHLORHEXIDINE GLUCONATE CLOTH 2 % EX PADS
6.0000 | MEDICATED_PAD | Freq: Every day | CUTANEOUS | Status: AC
  Administered 2013-02-08 – 2013-02-12 (×4): 6 via TOPICAL

## 2013-02-08 MED ORDER — SODIUM CHLORIDE 0.9 % IJ SOLN
3.0000 mL | Freq: Two times a day (BID) | INTRAMUSCULAR | Status: DC
  Administered 2013-02-08 – 2013-02-12 (×7): 3 mL via INTRAVENOUS

## 2013-02-08 MED ORDER — LISINOPRIL 10 MG PO TABS
10.0000 mg | ORAL_TABLET | Freq: Every day | ORAL | Status: DC
  Administered 2013-02-08 – 2013-02-11 (×4): 10 mg via ORAL
  Filled 2013-02-08 (×5): qty 1

## 2013-02-08 MED ORDER — AMPHETAMINE-DEXTROAMPHETAMINE 10 MG PO TABS
60.0000 mg | ORAL_TABLET | Freq: Every day | ORAL | Status: DC
  Administered 2013-02-09 – 2013-02-13 (×5): 60 mg via ORAL
  Filled 2013-02-08 (×5): qty 6

## 2013-02-08 MED ORDER — INSULIN DETEMIR 100 UNIT/ML ~~LOC~~ SOLN
10.0000 [IU] | Freq: Two times a day (BID) | SUBCUTANEOUS | Status: DC
  Administered 2013-02-08 – 2013-02-11 (×8): 10 [IU] via SUBCUTANEOUS
  Filled 2013-02-08 (×10): qty 0.1

## 2013-02-08 NOTE — Progress Notes (Signed)
The surgery p.m. Rounds  Patient's pulmonary status improved with diuresis and nebulized bronchodilator therapy Patient placed back on Xanax dose that he was taking before surgery Currently resting comfortably in regular rhythm Ready for transfer to step down pending bed availability

## 2013-02-08 NOTE — Anesthesia Postprocedure Evaluation (Signed)
  Anesthesia Post-op Note  Patient: Manuel Knight  Procedure(s) Performed: Procedure(s) with comments: CORONARY ARTERY BYPASS GRAFTING (CABG) with TEE (N/A) - CABG x three,  using left internal mammary artery and right leg greater saphenous vein harvested endoscopically  Patient Location: SICU  Anesthesia Type:General  Level of Consciousness: awake, alert , oriented and patient cooperative  Airway and Oxygen Therapy: Patient Spontanous Breathing and Patient connected to nasal cannula oxygen  Post-op Pain: mild  Post-op Assessment: Post-op Vital signs reviewed, Patient's Cardiovascular Status Stable, Respiratory Function Stable, Patent Airway, No signs of Nausea or vomiting, Adequate PO intake and Pain level controlled  Post-op Vital Signs: Reviewed and stable  Complications: No apparent anesthesia complications

## 2013-02-08 NOTE — Progress Notes (Addendum)
TCTS DAILY ICU PROGRESS NOTE                   [address]            [place] [address]          [phone]   2 Days Post-Op Procedure(s) (LRB): CORONARY ARTERY BYPASS GRAFTING (CABG) with TEE (N/A)  Total Length of Stay:  LOS: 5 days   Subjective: Patient coughing and "sweating". He states has been "sweating" since surgery.  Objective: Vital signs in last 24 hours: Temp:  [97.5 F (36.4 C)-99.2 F (37.3 C)] 99.2 F (37.3 C) (01/28 0400) Pulse Rate:  [92-108] 104 (01/28 0805) Cardiac Rhythm:  [-] Sinus tachycardia (01/28 0800) Resp:  [12-30] 19 (01/28 0805) BP: (106-144)/(63-80) 144/70 mmHg (01/28 0805) SpO2:  [83 %-98 %] 98 % (01/28 0805) Weight:  [116.2 kg (256 lb 2.8 oz)] 116.2 kg (256 lb 2.8 oz) (01/28 0500)  Filed Weights   02/06/13 0400 02/07/13 0500 02/08/13 0500  Weight: 121 kg (266 lb 12.1 oz) 119.9 kg (264 lb 5.3 oz) 116.2 kg (256 lb 2.8 oz)    Weight change: -3.7 kg (-8 lb 2.5 oz)      Intake/Output from previous day: 01/27 0701 - 01/28 0700 In: 835.8 [P.O.:600; I.V.:135.8; IV Piggyback:100] Out: 5085 [Urine:5075; Chest Tube:10]  Intake/Output this shift: Total I/O In: 20 [I.V.:20] Out: 60 [Urine:60]  Current Meds: Scheduled Meds: . acetaminophen (TYLENOL) oral liquid 160 mg/5 mL  650 mg Per Tube Once   Or  . acetaminophen  650 mg Rectal Once  . aspirin EC  325 mg Oral Daily   Or  . aspirin  324 mg Per Tube Daily  . atorvastatin  80 mg Oral q1800  . bisacodyl  10 mg Oral Daily   Or  . bisacodyl  10 mg Rectal Daily  . cefUROXime (ZINACEF)  IV  1.5 g Intravenous Q12H  . docusate sodium  200 mg Oral Daily  . DULoxetine  30 mg Oral Daily  . enoxaparin (LOVENOX) injection  40 mg Subcutaneous QHS  . insulin aspart  0-24 Units Subcutaneous Q4H  . insulin detemir  30 Units Subcutaneous BID  . insulin detemir  30 Units Subcutaneous BID  . metoprolol tartrate  25 mg Oral BID   Or  . metoprolol tartrate  25 mg Per Tube BID  .  pantoprazole  40 mg Oral Daily  . sodium chloride  3 mL Intravenous Q12H   Continuous Infusions: . sodium chloride Stopped (02/07/13 0800)  . sodium chloride    . sodium chloride    . dexmedetomidine Stopped (02/06/13 1730)  . DOPamine Stopped (02/06/13 1500)  . lactated ringers 10 mL/hr at 02/07/13 1500  . nitroGLYCERIN 25 mcg/min (02/06/13 1430)  . phenylephrine (NEO-SYNEPHRINE) Adult infusion Stopped (02/06/13 1900)   PRN Meds:.HYDROmorphone (DILAUDID) injection, metoprolol, midazolam, morphine injection, ondansetron (ZOFRAN) IV, oxyCODONE, sodium chloride  General appearance: alert, cooperative and no distress Heart: RRR Lungs: Wheezing and diminished at bases Abdomen: soft, non-tender; bowel sounds normal; no masses,  no organomegaly Extremities: Mild LE edema Wound: Sternal dressing is clean and dry;RLE wound is clean and dry  Lab Results: CBC: Recent Labs  02/07/13 1700 02/08/13 0435  WBC 17.3* 15.8*  HGB 12.4* 11.9*  HCT 37.8* 36.5*  PLT 212 205   BMET:  Recent Labs  02/07/13 0400 02/07/13 1656 02/07/13 1700 02/08/13 0435  NA 140 137  --  140  K 4.4 4.5  --  4.7  CL 104 96  --  99  CO2 24  --   --  30  GLUCOSE 105* 149*  --  154*  BUN 16 18  --  17  CREATININE 0.83 1.00 0.88 0.81  CALCIUM 8.1*  --   --  8.7    PT/INR:  Recent Labs  02/06/13 1425  LABPROT 18.5*  INR 1.59*   Radiology: Dg Chest Portable 1 View In Am  02/07/2013   CLINICAL DATA:  Status post CABG.  EXAM: PORTABLE CHEST - 1 VIEW  COMPARISON:  DG CHEST 1V PORT dated 02/06/2013  FINDINGS: The patient has been extubated. Swan-Ganz catheter tip remains in the proximal right pulmonary artery. Left chest tube in place without pneumothorax. Residual mild bibasilar atelectasis present. No edema or significant airspace consolidation is identified. Cardiac and mediastinal contours are stable.  IMPRESSION: No pneumothorax.  Bibasilar atelectasis.   Electronically Signed   By: Aletta Edouard M.D.    On: 02/07/2013 08:10   Dg Chest Portable 1 View  02/06/2013   CLINICAL DATA:  Postoperative from CABG  EXAM: PORTABLE CHEST - 1 VIEW  COMPARISON:  DG CHEST 1V PORT dated 02/05/2013  FINDINGS: The lungs are reasonably well inflated. The cardiopericardial silhouette is mildly enlarged. The patient has undergone CABG. Its 6 intact sternal wires are visible in the seventh wire is partially excluded from the field of view inferiorly. The endotracheal tube tip lies approximately 4.7 cm above the crotch of the carina. The esophagogastric tube tip and proximal port project off the inferior margin of the image. There is a Swan-Ganz catheter in place vir a right internal jugular Cordis sheath. The tip of the Swan-Ganz catheter lies in the region of the proximal main pulmonary outflow tract. There is also a right internal jugular venous catheter in place whose tip lies in the region of the proximal to mid SVC. A large calibered chest tube is in place on the left and comes to lie in the interspace of the fourth and fifth ribs. There is no pneumothorax or significant pleural effusion.  IMPRESSION: There are postsurgical changes present. There is no significant increase in pulmonary interstitial edema nor evidence of a pneumothorax or pleural effusion. The support tubes and lines appear to be in reasonable position.   Electronically Signed   By: David  Martinique   On: 02/06/2013 15:05     Assessment/Plan: S/P Procedure(s) (LRB): CORONARY ARTERY BYPASS GRAFTING (CABG) with TEE (N/A)  1.CV-S/p NSTEMI. On Nitro gttp, Lopressor 25 bid. Will add ACE for better bp control. 2.Pulmonary-CXR appears to show no pneumothorax, bibasilar atelectasis. Encourage incentive spirometer and flutter valve. Xopenex PRN. Tussinonex for cough 3.Volume overload-continue with daily lasix 4.ABL anemia-H and H 11.9 and 36.5 5.DM-CBGs 180/144/145. On Levemir. Was on Metformin 1000 bid pre op. Will restart low dose. 6.Restart Xanax as takes 4  times daily prior to surgery to avoid benzo withdrawal 7.Transfer to PCTU    Nani Skillern  PA-C  02/08/2013 8:19 AM  I have seen and examined the patient and agree with the assessment and plan as outlined.  Ready for transfer to step down.  Add ACE-I.  Encourage pulm toilet.  OWEN,CLARENCE H 02/08/2013 9:01 AM

## 2013-02-08 NOTE — Progress Notes (Addendum)
Josie Saunders PA updated pt status at bedside. Updated pt diaphoretic, although pt states he is cold. Updated pt on 2-3L [place]. SR/ST (HR range 90-110s). Updated difficult stick per OR and PIV pt currently has needs to be rotated, pt also with double lumen CVC (right). PA stated if IV team able to start new PIV, ok to D/C central line. Updated difficult to currently keep clean/intact dressing to IJ due to pt sweating. Updated pt takes xanax regurlarly at home, not receiving post op. PA updated pt with flat affect, slow to respond to questions, answers orientation questions appropriately. Updated pt with congested, frequent cough. Secretions white/thin. PA to order PRN tussinonex for cough. Will d/c foley catheter this shift. Pt ambulated 314ft around unit this AM, currently up in chair. Will continue to monitor. Reather Laurence

## 2013-02-09 ENCOUNTER — Inpatient Hospital Stay (HOSPITAL_COMMUNITY): Payer: Medicare Other

## 2013-02-09 LAB — CBC
HEMATOCRIT: 34.8 % — AB (ref 39.0–52.0)
HEMOGLOBIN: 11.4 g/dL — AB (ref 13.0–17.0)
MCH: 30.4 pg (ref 26.0–34.0)
MCHC: 32.8 g/dL (ref 30.0–36.0)
MCV: 92.8 fL (ref 78.0–100.0)
Platelets: 239 10*3/uL (ref 150–400)
RBC: 3.75 MIL/uL — ABNORMAL LOW (ref 4.22–5.81)
RDW: 14 % (ref 11.5–15.5)
WBC: 13.6 10*3/uL — ABNORMAL HIGH (ref 4.0–10.5)

## 2013-02-09 LAB — GLUCOSE, CAPILLARY
GLUCOSE-CAPILLARY: 202 mg/dL — AB (ref 70–99)
GLUCOSE-CAPILLARY: 148 mg/dL — AB (ref 70–99)
GLUCOSE-CAPILLARY: 152 mg/dL — AB (ref 70–99)
Glucose-Capillary: 140 mg/dL — ABNORMAL HIGH (ref 70–99)
Glucose-Capillary: 180 mg/dL — ABNORMAL HIGH (ref 70–99)
Glucose-Capillary: 155 mg/dL — ABNORMAL HIGH (ref 70–99)

## 2013-02-09 LAB — BASIC METABOLIC PANEL
BUN: 24 mg/dL — AB (ref 6–23)
CALCIUM: 8.5 mg/dL (ref 8.4–10.5)
CO2: 28 mEq/L (ref 19–32)
Chloride: 96 mEq/L (ref 96–112)
Creatinine, Ser: 0.78 mg/dL (ref 0.50–1.35)
GFR calc Af Amer: >90 mL/min (ref >90)
GFR calc non Af Amer: >90 mL/min (ref >90)
GLUCOSE: 147 mg/dL — AB (ref 70–99)
Potassium: 4 mEq/L (ref 3.7–5.3)
Sodium: 135 mEq/L — ABNORMAL LOW (ref 137–147)

## 2013-02-09 MED ORDER — GUAIFENESIN ER 600 MG PO TB12
600.0000 mg | ORAL_TABLET | Freq: Two times a day (BID) | ORAL | Status: DC
  Administered 2013-02-09 – 2013-02-13 (×9): 600 mg via ORAL
  Filled 2013-02-09 (×10): qty 1

## 2013-02-09 MED ORDER — INSULIN ASPART 100 UNIT/ML ~~LOC~~ SOLN
0.0000 [IU] | SUBCUTANEOUS | Status: DC
Start: 2013-02-09 — End: 2013-02-10
  Administered 2013-02-09 (×4): 2 [IU] via SUBCUTANEOUS

## 2013-02-09 MED ORDER — METFORMIN HCL 500 MG PO TABS
1000.0000 mg | ORAL_TABLET | Freq: Two times a day (BID) | ORAL | Status: DC
  Administered 2013-02-09 – 2013-02-13 (×8): 1000 mg via ORAL
  Filled 2013-02-09 (×11): qty 2

## 2013-02-09 NOTE — Progress Notes (Signed)
3 Days Post-Op Procedure(s) (LRB): CORONARY ARTERY BYPASS GRAFTING (CABG) with TEE (N/A) Subjective:  No complaints. Wants to walk  Objective: Vital signs in last 24 hours: Temp:  [97.2 F (36.2 C)-98.3 F (36.8 C)] 97.2 F (36.2 C) (01/29 0751) Pulse Rate:  [88-107] 96 (01/29 0700) Cardiac Rhythm:  [-] Sinus tachycardia (01/28 2000) Resp:  [13-27] 18 (01/29 0700) BP: (88-145)/(50-96) 109/56 mmHg (01/29 0500) SpO2:  [90 %-99 %] 97 % (01/29 0806) Weight:  [115.6 kg (254 lb 13.6 oz)] 115.6 kg (254 lb 13.6 oz) (01/29 0500)  Hemodynamic parameters for last 24 hours:    Intake/Output from previous day: 01/28 0701 - 01/29 0700 In: 1040 [P.O.:900; I.V.:90; IV Piggyback:50] Out: 715 [Urine:715] Intake/Output this shift:    General appearance: alert and cooperative Neurologic: intact. Flat affect present pre-op Heart: regular rate and rhythm, S1, S2 normal, no murmur, click, rub or gallop Lungs: diminished breath sounds bibasilar Extremities: extremities normal, atraumatic, no cyanosis or edema Wound: incision ok  Lab Results:  Recent Labs  02/08/13 0435 02/09/13 0251  WBC 15.8* 13.6*  HGB 11.9* 11.4*  HCT 36.5* 34.8*  PLT 205 239   BMET:  Recent Labs  02/08/13 0435 02/09/13 0251  NA 140 135*  K 4.7 4.0  CL 99 96  CO2 30 28  GLUCOSE 154* 147*  BUN 17 24*  CREATININE 0.81 0.78  CALCIUM 8.7 8.5    PT/INR:  Recent Labs  02/06/13 1425  LABPROT 18.5*  INR 1.59*   ABG    Component Value Date/Time   PHART 7.314* 02/06/2013 1911   HCO3 23.5 02/06/2013 1911   TCO2 28 02/07/2013 1656   ACIDBASEDEF 2.0 02/06/2013 1911   O2SAT 91.0 02/06/2013 1911   CBG (last 3)   Recent Labs  02/08/13 2329 02/09/13 0347 02/09/13 0744  GLUCAP 153* 148* 140*   CXR:  Bibasilar atelectasis. Assessment/Plan: S/P Procedure(s) (LRB): CORONARY ARTERY BYPASS GRAFTING (CABG) with TEE (N/A) Mobilize, IS. Productive cough. Add guaifenesin. Diabetes control: glucose under  adequate control. Increase metformin to 1000 bid as at home. Plan for transfer to 2W step-down.  LOS: 6 days    BARTLE,BRYAN K 02/09/2013

## 2013-02-09 NOTE — Progress Notes (Signed)
[facility] Card[facility] Re[facility] On arrival pt in recliner, states that he has walked 4 times today. Pt asking for cough medication" Tussinex". I talked with RN she states that he has walked 2 times today and that he has been confused. I encouraged him to take one more walk before bedtime.He had no coughing while I was in his room. We will follow tomorrow. Deon Pilling, RN 02/09/2013 3:24 PM

## 2013-02-09 NOTE — Progress Notes (Signed)
Pt transferred to 2W06. Pt ambulated distance to new room, tolerated well. Transferred on portable tele, 1LNC. Family present for transfer. Pt cell phone, charger and clothing sent with pt to new room. Receiving RN present on arrival, pt placed on receiving units tele box and 1LNC. Will continue to monitor. Reather Laurence

## 2013-02-10 LAB — GLUCOSE, CAPILLARY
GLUCOSE-CAPILLARY: 160 mg/dL — AB (ref 70–99)
GLUCOSE-CAPILLARY: 150 mg/dL — AB (ref 70–99)
Glucose-Capillary: 152 mg/dL — ABNORMAL HIGH (ref 70–99)
Glucose-Capillary: 134 mg/dL — ABNORMAL HIGH (ref 70–99)

## 2013-02-10 MED ORDER — INSULIN ASPART 100 UNIT/ML ~~LOC~~ SOLN
0.0000 [IU] | Freq: Three times a day (TID) | SUBCUTANEOUS | Status: DC
  Administered 2013-02-10 – 2013-02-12 (×5): 2 [IU] via SUBCUTANEOUS

## 2013-02-10 MED ORDER — SULFAMETHOXAZOLE-TMP DS 800-160 MG PO TABS
1.0000 | ORAL_TABLET | Freq: Two times a day (BID) | ORAL | Status: DC
  Administered 2013-02-10 – 2013-02-13 (×7): 1 via ORAL
  Filled 2013-02-10 (×8): qty 1

## 2013-02-10 MED ORDER — ALPRAZOLAM 0.25 MG PO TABS
0.2500 mg | ORAL_TABLET | Freq: Four times a day (QID) | ORAL | Status: DC
  Administered 2013-02-10 – 2013-02-13 (×13): 0.25 mg via ORAL
  Filled 2013-02-10 (×13): qty 1

## 2013-02-10 MED ORDER — CANAGLIFLOZIN 100 MG PO TABS
100.0000 mg | ORAL_TABLET | Freq: Every day | ORAL | Status: DC
  Administered 2013-02-10 – 2013-02-12 (×3): 100 mg via ORAL
  Filled 2013-02-10 (×3): qty 1

## 2013-02-10 NOTE — Discharge Summary (Signed)
[name]       [place] [address]             [phone]       Manuel Knight 23-Jun-1960 53 y.o. [id number]  02/03/2013   [name], MD  elevated trop  Chest pain   HPI:  This is a 53 y.o. male recently developed increasing symptoms of chest pain. On the day of admission the symptoms were even worse and he presented to [facility] emergency department. There, EC ECG was abnormal and cardiac enzymes were elevated. He was transferred to [facility] for further evaluation and treatment. He is a previous history of "small heart attack" but does not recall having previous stress test or catheterization. He was followed in [place] for a short-term by cardiology but subsequently quick going after a time. Symptoms have been described as chest pain which were severe and intermittent. Pain was characterized as 8/10 and it was mainly in the left arm and shoulder area. He denies shortness of breath but did get some diaphoresis. Past Medical History   Diagnosis  Date   .  Anxiety    .  Pulmonary nodule    .  Hyperlipidemia    .  COPD (chronic obstructive pulmonary disease)    .  Chronic back pain    .  Obesity    .  Diabetes mellitus, type 2    .  Hypertension    .  Cellulitis  2011     of neck   .  GERD (gastroesophageal reflux disease)    .  Claustrophobia    .  Diverticulitis of colon  [id number]     complicated by abscess   .  Heart attack     Surgical History:  Past Surgical History   Procedure  Laterality  Date   .  Appendectomy   2007     gangrenous appendicitis   .  Tonsillectomy      I have reviewed the patient's current medications.  Prior to Admission medications - not yet confirmed   Medication  Sig  Start Date  End Date  Taking?  Authorizing Provider   albuterol (PROVENTIL HFA;VENTOLIN HFA) 108 (90 BASE) MCG/ACT inhaler  Inhale 2 puffs into the lungs every 6 (six) hours as needed. Patient states that he uses 3 times daily      Historical Provider, MD   ALPRAZolam [name]) 1 MG tablet  Take 2 mg by mouth 4 (four) times daily.     Historical Provider, MD   benazepril-hydrochlorthiazide (LOTENSIN HCT) 20-12.5 MG per tablet  Take 2 tablets by mouth daily.     Historical Provider, MD   Fluticasone-Salmeterol (ADVAIR DISKUS) 500-50 MCG/DOSE AEPB  Inhale 1 puff into the lungs every 12 (twelve) hours.     Historical Provider, MD   levalbuterol Penne Lash) 0.63 MG/3ML nebulizer solution  Take 3 mLs (0.63 mg total) by nebulization 4 (four) times daily.  05/24/11  08/05/12   [name], MD   metFORMIN (GLUCOPHAGE) 1000 MG tablet  Take 1,000 mg by mouth 2 (two) times daily with a meal.     Historical Provider, MD   oxyCODONE (ROXICODONE) 15 MG immediate release tablet  Take 1 tablet (15 mg total) by mouth every 4 (four) x day  08/05/12    [name], PA-C   tiotropium (SPIRIVA) 18 MCG inhalation capsule  Place 18 mcg into inhaler and inhale daily.     Historical Provider,  MD   Scheduled Meds:  Continuous Infusions:  PRN Meds:.metoprolol  Allergies:  Allergies   Allergen  Reactions   .  Tylenol [Acetaminophen]  Hives   .  Propoxyphene N-Acetaminophen  Other (See Comments)     unknown   .  Lodine [Etodolac]  Swelling and Rash    History    Social History   .  Marital Status:  Single     Spouse Name:  N/A     Number of Children:  2tw   .  Years of Education:  N/A    Occupational History   .  Disabled     Social History Main Topics   .  Smoking status:  Former Smoker -- 1.50 packs/day     Types:  Cigarettes     Quit date:  01/13/1988   .  Smokeless tobacco:  Never Used   .  Alcohol Use:  No      Comment:   .  Drug Use:  No      Comment: Formerly used marijuana for the pain, denies now.   .  Sexual Activity:  Not on file    Other Topics  Concern   .  Not on file    Social History Narrative    Lives with significant other of 18 years. They are raising his grandson.    Family History   Problem  Relation  Age  of Onset   .  Heart attack  Father    .  Diabetes  Sister    .  Coronary artery disease  Sister    .  Heart attack  Brother    .  Colon cancer  Neg Hx    .  Cancer  Mother      "blood transplant"   .  Coronary artery disease  Mother     Family Status   Relation  Status  Death Age   .  Mother  Deceased    .  Father  Deceased    .  Sister  Alive    .  Sister  Alive    .  [facility] Course:  The patient was transferred to [facility] for further evaluation and treatment to include cardiac catheterization. On 02/03/2013 he was taken to the catheterization lab by Dr. Irish Lack. The following results were noted: PROCEDURE: Left heart catheterization with selective coronary angiography, left ventriculogram.  INDICATIONS: NSTEMI  The risks, benefits, and details of the procedure were explained to the patient. The patient verbalized understanding and wanted to proceed. Informed written consent was obtained.  PROCEDURE TECHNIQUE: After Xylocaine anesthesia a 16F slender sheath was placed in the right radial artery with a single anterior needle wall stick. Right coronary angiography was done using a Judkins R4 guide catheter. Left coronary angiography was done using a Judkins L3.5 guide catheter. Left ventriculography was done using a pigtail catheter. A TR band was used for hemostasis.  CONTRAST: Total of 90 cc.  COMPLICATIONS: None.  HEMODYNAMICS: Aortic pressure was 132/86; LV pressure was 134/14; LVEDP 20. There was no gradient between the left ventricle and aorta.  ANGIOGRAPHIC DATA: The left main coronary artery is widely patent.  The left anterior descending artery is a large vessel which wraps around the apex. There is mild ostial LAD disease. There is a large branching septal proximally. In the mid LAD, there is a diffuse area of atherosclerosis with up to 80% stenosis.  After this diffuse lesion, there is a medium-sized diagonal which is patent. There several  smaller diagonals which are patent. The first diagonal has a high takeoff and has diffuse severe disease.  The left circumflex artery is a large vessel. The first obtuse marginal is a medium size vessel with a diffuse stenosis up to 80%. M2 is medium-sized and patent.  The right coronary artery is a large dominant vessel. There is moderate proximal disease. In the midportion, there is a focal 90%, thrombotic stenosis. This is followed by a long segment of moderate disease. The posterior descending artery has mild diffuse atherosclerosis and some more severe disease in the mid portion. The posterior lateral artery branches and in the lower pole, there is a severe, 90% stenosis. 6  LEFT VENTRICULOGRAM: Left ventricular angiogram was done in the 30 RAO projection and revealed global left ventricular hypokinesis of moderate degree with severe apical hypokinesis. Overall systolic function is moderate to severely decreased with an estimated ejection fraction of 30%. LVEDP was 20 mmHg.  IMPRESSIONS:  1. Normal left main coronary artery. 2. Severe disease in in the mid left anterior descending artery, up to 80%. Significant disease in a medium-sized first diagonal. 3. Patent left circumflex artery. OM1 with 80% stenosis. 4. Long segment of moderate to severe disease in the proximal to mid right coronary artery, up to 90%. This is likely the culprit for his presentation today.  5. Decreased left ventricular systolic function. LVEDP 20 mmHg. Ejection fraction 30%. RECOMMENDATION: Patient has multivessel disease. Per the history, he has not been compliant with his medications. Given that he may not be compliant with dual antiplatelet therapy and he is diabetic, we'll obtain surgical consult. He also has significantly decreased left ventricular function. He will stay in the hospital until his bypass surgery.      He was seen by Arvid Right M.D. in cardiothoracic surgical consultation and recommended to proceed  with surgical coronary artery revascularization. He has some elevation in his creatinine which was followed preoperatively to see if he would return to his normal. He remained medically stable and on 02/06/2013 he was taken to the operating room at which time he underwent the following procedure:  CARDIOVASCULAR SURGERY OPERATIVE NOTE  02/06/2013  Surgeon: [name], MD  First Assistant: Jadene Pierini, [facility]  Preoperative Diagnosis: Severe multi-vessel coronary artery disease, s/p NSTEMI.  Postoperative Diagnosis: Same  Procedure:  6. Median Sternotomy 7. Extracorporeal circulation 3. Coronary artery bypass grafting x 3  Left internal mammary graft to the LAD  SVG to OM  SVG to RCA 4. Endoscopic vein harvest from the right leg  Anesthesia: General Endotracheal The patient was then transported to the surgical intensive care unit in critical but stable condition.   Postoperative hospital course:  The patient has overall done quite well. He was weaned from the ventilator without difficulty. He has remained neurologically intact. He initially she required some low-dose dopamine this was weaned without difficulty. All routine lines, monitors and drainage devices have been discontinued in the standard fashion. He has required aggressive pulmonary toilet. This has also included nebulizers. He has a moderate volume overload but is responding well to diuretics. Diabetes has been under good control using standard protocols with transition to his oral medications. He has had some oversedation symptoms and his Xanax has been adjusted. The sternal incision has shown some evidence of fatty necrosis were with mild drainage and he is on Bactrim. Overall he is felt to be tentatively stable for  discharge in the next 48-72 hours pending ongoing reevaluation of his recovery.       Recent Labs  02/08/13 0435 02/09/13 0251  NA 140 135*  K 4.7 4.0  CL 99 96  CO2 30 28  GLUCOSE 154* 147*  BUN 17 24*    CALCIUM 8.7 8.5    Recent Labs  02/08/13 0435 02/09/13 0251  WBC 15.8* 13.6*  HGB 11.9* 11.4*  HCT 36.5* 34.8*  PLT 205 239   No results found for this basename: INR,  in the last 72 hours   Discharge Instructions:  The patient is discharged to home with extensive instructions on wound care and progressive ambulation.  They are instructed not to drive or perform any heavy lifting until returning to see the physician in his office.  Discharge Diagnosis:  elevated trop  Chest pain  Secondary Diagnosis: Patient Active Problem List   Diagnosis Date Noted  . S/P CABG x 3 02/06/2013  . CAD (coronary artery disease), native coronary artery 02/05/2013  . Non-ST elevation myocardial infarction (NSTEMI), initial episode of care 02/03/2013  . NSTEMI (non-ST elevated myocardial infarction) 02/03/2013  . Acute respiratory failure 05/21/2011  . Accelerated hypertension 05/21/2011  . GERD (gastroesophageal reflux disease) 12/15/2010  . Esophageal dysphagia 12/15/2010  . Chronic diarrhea 12/15/2010  . CELLULITIS, NECK 12/07/2009  . DISORDER OF BONE AND CARTILAGE UNSPECIFIED 08/27/2009  . HERNIA 06/24/2009  . ABDOMINAL PAIN 06/24/2009  . FATIGUE 01/25/2009  . 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 ACUTE LYMPHADENITIS 11/23/2007  . NUMBNESS 08/23/2007  . DIABETES MELLITUS, TYPE II 05/13/2007  . HYPERLIPIDEMIA 05/13/2007  . Obesity, unspecified 05/13/2007  . ANXIETY 05/13/2007  . HYPERTENSION 05/13/2007  . COPD 05/13/2007  . PULMONARY NODULE 05/13/2007  . BACK PAIN, CHRONIC 05/13/2007   Past Medical History  Diagnosis Date  . Anxiety   . Pulmonary nodule   . Hyperlipidemia   . COPD (chronic obstructive pulmonary disease)   . Chronic back pain   . Obesity   . Diabetes mellitus, type 2   . Hypertension   . Cellulitis 2011    of neck   . GERD (gastroesophageal reflux disease)   . Claustrophobia   . Diverticulitis of colon 0272    complicated by  abscess  . Heart attack    Medication list:   Medication List    STOP taking these medications       albuterol 108 (90 BASE) MCG/ACT inhaler  Commonly known as:  PROVENTIL HFA;VENTOLIN HFA     cloNIDine 0.3 MG tablet  Commonly known as:  CATAPRES     gabapentin 300 MG capsule  Commonly known as:  NEURONTIN     insulin detemir 100 UNIT/ML injection  Commonly known as:  LEVEMIR     meloxicam 15 MG tablet  Commonly known as:  MOBIC     pantoprazole 40 MG tablet  Commonly known as:  PROTONIX      TAKE these medications       ADVAIR DISKUS 500-50 MCG/DOSE Aepb  Generic drug:  Fluticasone-Salmeterol  Inhale 1 puff into the lungs every 12 (twelve) hours.     ALPRAZolam 0.25 MG tablet  Commonly known as:  XANAX  Take 1 tablet (0.25 mg total) by mouth 4 (four) times daily.     amLODipine 10 MG tablet  Commonly known as:  NORVASC  Take 10 mg by mouth daily.     amphetamine-dextroamphetamine 20 MG tablet  Commonly known as:  ADDERALL  Take 60 mg by mouth daily.     aspirin 325 MG EC tablet  Take 1 tablet (325 mg total) by mouth daily.     atorvastatin 80 MG tablet  Commonly known as:  LIPITOR  Take 1 tablet (80 mg total) by mouth daily at 6 PM.     benazepril 20 MG tablet  Commonly known as:  LOTENSIN  Take 20 mg by mouth daily.     DULoxetine 30 MG capsule  Commonly known as:  CYMBALTA  Take 30 mg by mouth daily.     esomeprazole 40 MG capsule  Commonly known as:  NEXIUM  Take 40 mg by mouth daily at 12 noon.     INVOKANA 100 MG Tabs  Generic drug:  Canagliflozin  Take 100 mg by mouth daily.     metFORMIN 1000 MG tablet  Commonly known as:  GLUCOPHAGE  Take 1,000 mg by mouth 2 (two) times daily with a meal.     metoprolol 50 MG tablet  Commonly known as:  LOPRESSOR  Take 1 tablet (50 mg total) by mouth 2 (two) times daily.     NOVOLOG FLEXPEN 100 UNIT/ML FlexPen  Generic drug:  insulin aspart  Inject 0-26 Units into the skin 3 (three) times daily  with meals. 20 units three times a day with sliding scale max of 26 units     oxyCODONE 5 MG immediate release tablet  Commonly known as:  Oxy IR/ROXICODONE  Take 1-2 tablets (5-10 mg total) by mouth every 6 (six) hours as needed for moderate pain.     sulfamethoxazole-trimethoprim 800-160 MG per tablet  Commonly known as:  BACTRIM DS  Take 1 tablet by mouth every 12 (twelve) hours.     tiotropium 18 MCG inhalation capsule  Commonly known as:  SPIRIVA  Place 18 mcg into inhaler and inhale daily.       The patient has been discharged on:   1.Beta Blocker:  Yes [ y  ]                              No   [   ]                              If No, reason:  2.Ace Inhibitor/ARB: Yes [ y  ]                                     No  [    ]                                     If No, reason:  3.Statin:   Yes [ y  ]                  No  [   ]                  If No, reason:  4.Ecasa:  Yes  [ y  ]                  No   [   ]  If No, reason: Follow-up Information   Follow up with [name], MD Today. (03/01/2013 at 12 noon to see Dr. Cyndia Bent. Please obtain chest x-ray at [facility] is located in the same office complex.)    Specialty:  Cardiothoracic Surgery   Contact information:   [place] [address] [phone]       Follow up with Herminio Commons, MD. (02/23/13 at 3:00 pm for cardiology follow-up)    Specialty:  Cardiology   Contact information:   [address] [place] [address] [phone]       Condition on discharge is Good Discharge: home Jadene Pierini, [place] 02/10/2013  12:32 PM

## 2013-02-10 NOTE — Progress Notes (Signed)
CARDIAC REHAB PHASE I   PRE:  Rate/Rhythm: 97SR  BP:  Supine:   Sitting: 130/72  Standing:    SaO2: 99%2L  MODE:  Ambulation: 500 ft   POST:  Rate/Rhythm: 106ST  BP:  Supine:   Sitting: 160/98  Standing:    SaO2: 96%2L 0958-1047 Pt very sleepy looking but responds to questions slowly and appropriately. Walked 500 ft on 2L and gait belt and rolling walker with slow, steady gait. Stated he liked to walk. To recliner after walk with call bell. Encouraged two more walks with staff. Coughing productively.   [name], RN BSN  02/10/2013 10:43 AM

## 2013-02-10 NOTE — Progress Notes (Addendum)
[address]       [place] [address]             [phone]      4 Days Post-Op  Procedure(s) (LRB): CORONARY ARTERY BYPASS GRAFTING (CABG) with TEE (N/A) Subjective: Appears to be somewhat over sedated,lethargic but respods appropriatly to prompts  Objective  Telemetry sinus rhythm, QTc 455  Temp:  [96.3 F (35.7 C)-98.2 F (36.8 C)] 96.3 F (35.7 C) (01/30 0444) Pulse Rate:  [88-116] 88 (01/30 0444) Resp:  [18-23] 18 (01/30 0444) BP: (93-144)/(54-78) 110/69 mmHg (01/30 0444) SpO2:  [93 %-99 %] 94 % (01/30 0444) Weight:  [254 lb 6.4 oz (115.395 kg)] 254 lb 6.4 oz (115.395 kg) (01/30 0444)   Intake/Output Summary (Last 24 hours) at 02/10/13 0726 Last data filed at 02/10/13 0451  Gross per 24 hour  Intake    840 ml  Output   1475 ml  Net   -635 ml       General appearance: alert, cooperative, fatigued and no distress Neurologic: somewhat lethargic Heart: regular rate and rhythm Lungs: dim in bases Abdomen: benign Extremities: mild edema Wound: incis healing well  Lab Results:  Recent Labs  02/07/13 1700 02/08/13 0435 02/09/13 0251  NA  --  140 135*  K  --  4.7 4.0  CL  --  99 96  CO2  --  30 28  GLUCOSE  --  154* 147*  BUN  --  17 24*  CREATININE 0.88 0.81 0.78  CALCIUM  --  8.7 8.5  MG 2.0  --   --    No results found for this basename: AST, ALT, ALKPHOS, BILITOT, PROT, ALBUMIN,  in the last 72 hours No results found for this basename: LIPASE, AMYLASE,  in the last 72 hours  Recent Labs  02/08/13 0435 02/09/13 0251  WBC 15.8* 13.6*  HGB 11.9* 11.4*  HCT 36.5* 34.8*  MCV 93.4 92.8  PLT 205 239   No results found for this basename: CKTOTAL, CKMB, TROPONINI,  in the last 72 hours No components found with this basename: POCBNP,  No results found for this basename: DDIMER,  in the last 72 hours No results found for this basename: HGBA1C,  in the last 72 hours No results found for this basename: CHOL, HDL, LDLCALC,  TRIG, CHOLHDL,  in the last 72 hours No results found for this basename: TSH, T4TOTAL, FREET3, T3FREE, THYROIDAB,  in the last 72 hours No results found for this basename: VITAMINB12, FOLATE, FERRITIN, TIBC, IRON, RETICCTPCT,  in the last 72 hours  Medications: Scheduled . acetaminophen (TYLENOL) oral liquid 160 mg/5 mL  650 mg Per Tube Once   Or  . acetaminophen  650 mg Rectal Once  . ALPRAZolam  1 mg Oral QID  . amphetamine-dextroamphetamine  60 mg Oral Q breakfast  . aspirin EC  325 mg Oral Daily   Or  . aspirin  324 mg Per Tube Daily  . atorvastatin  80 mg Oral q1800  . bisacodyl  10 mg Oral Daily   Or  . bisacodyl  10 mg Rectal Daily  . Chlorhexidine Gluconate Cloth  6 each Topical Q0600  . docusate sodium  200 mg Oral Daily  . DULoxetine  30 mg Oral Daily  . enoxaparin (LOVENOX) injection  40 mg Subcutaneous QHS  . guaiFENesin  600 mg Oral BID  . insulin aspart  0-24 Units Subcutaneous TID AC & HS  . insulin detemir  10 Units Subcutaneous BID  . lisinopril  10 mg Oral Daily  . metFORMIN  1,000 mg Oral BID WC  . metoprolol tartrate  25 mg Oral BID   Or  . metoprolol tartrate  25 mg Per Tube BID  . mometasone-formoterol  2 puff Inhalation BID  . mupirocin ointment  1 application Nasal BID  . pantoprazole  40 mg Oral Daily  . sodium chloride  3 mL Intravenous Q12H  . sodium chloride  3 mL Intravenous Q12H  . tiotropium  18 mcg Inhalation Daily     Radiology/Studies:  Dg Chest 2 View  02/09/2013   CLINICAL DATA:  Chest pain after CABG.  EXAM: CHEST  2 VIEW  COMPARISON:  02/08/2013, 02/07/2013, and 02/06/2013  FINDINGS: Central line is been removed. There is no pneumothorax. Heart size and pulmonary vascularity are normal. There is minimal bibasilar atelectasis with tiny bilateral pleural effusions.  IMPRESSION: Minimal bibasilar atelectasis in tiny effusions.  No pneumothorax.   Electronically Signed   By: Rozetta Nunnery M.D.   On: 02/09/2013 08:10    INR: Will add last  result for INR, ABG once components are confirmed Will add last 4 CBG results once components are confirmed  Assessment/Plan: S/P Procedure(s) (LRB): CORONARY ARTERY BYPASS GRAFTING (CABG) with TEE (N/A)  1 oversedation - will decrease xanax, but was on pretty high dose preop so prob can't make only prn 2 sugars mod elevated, restart home invokana 3 BP pretty well controlled on current rx- follow as may need further tx 4 diuresing well- may need lasix, watch for now 5 pulm toilet/rehab 6 recheck labs in am  LOS: 7 days    GOLD,WAYNE E 1/30/20157:26 AM   Chart reviewed, patient examined, agree with above. He has some fat necrosis type drainage from the lower end of the chest incision. Will put on Bactrim since he was MRSA positive. Keep incision clean and covered and check daily. Agree with reducing Xanax.

## 2013-02-10 NOTE — Discharge Instructions (Signed)
Endoscopic Saphenous Vein Harvesting °Care After °Refer to this sheet in the next few weeks. These instructions provide you with information on caring for yourself after your procedure. Your caregiver may also give you more specific instructions. Your treatment has been planned according to current medical practices, but problems sometimes occur. Call your caregiver if you have any problems or questions after your procedure. °HOME CARE INSTRUCTIONS °Medicine °· Take whatever pain medicine your surgeon prescribes. Follow the directions carefully. Do not take over-the-counter pain medicine unless your surgeon says it is okay. Some pain medicine can cause bleeding problems for several weeks after surgery. °· Follow your surgeon's instructions about driving. You will probably not be permitted to drive after heart surgery. °· Take any medicines your surgeon prescribes. Any medicines you took before your heart surgery should be checked with your caregiver before you start taking them again. °Wound care °· Ask your surgeon how long you should keep wearing your elastic bandage or stocking. °· Check the area around your surgical cuts (incisions) whenever your bandages (dressings) are changed. Look for any redness or swelling. °· You will need to return to have the stitches (sutures) or staples taken out. Ask your surgeon when to do that. °· Ask your surgeon when you can shower or bathe. °Activity °· Try to keep your legs raised when you are sitting. °· Do any exercises your caregivers have given you. These may include deep breathing exercises, coughing, walking, or other exercises. °SEEK MEDICAL CARE IF: °· You have any questions about your medicines. °· You have more leg pain, especially if your pain medicine stops working. °· New or growing bruises develop on your leg. °· Your leg swells, feels tight, or becomes red. °· You have numbness in your leg. °SEEK IMMEDIATE MEDICAL CARE IF: °· Your pain gets much worse. °· Blood  or fluid leaks from any of the incisions. °· Your incisions become warm, swollen, or red. °· You have chest pain. °· You have trouble breathing. °· You have a fever. °· You have more pain near your leg incision. °MAKE SURE YOU: °· Understand these instructions. °· Will watch your condition. °· Will get help right away if you are not doing well or get worse. °Document Released: 09/10/2010 Document Revised: 03/23/2011 Document Reviewed: 09/10/2010 °ExitCare® Patient Information ©2014 [employer]. °Coronary Artery Bypass Grafting, Care After °Refer to this sheet in the next few weeks. These instructions provide you with information on caring for yourself after your procedure. Your health care provider may also give you more specific instructions. Your treatment has been planned according to current medical practices, but problems sometimes occur. Call your health care provider if you have any problems or questions after your procedure. °WHAT TO EXPECT AFTER THE PROCEDURE °Recovery from surgery will be different for everyone. Some people feel well after 3 or 4 weeks, while for others it takes longer. After your procedure, it is typical to have the following: °· Nausea and a lack of appetite.   °· Constipation. °· Weakness and fatigue.   °· Depression or irritability.   °· Pain or discomfort at your incision site. °HOME CARE INSTRUCTIONS °· Only take over-the-counter or prescription medicines as directed by your health care provider. Take all medicines exactly as directed. Do not stop taking medicines or start any new medicines without first checking with your health care provider.   °· Take your pulse as directed by your health care provider. °· Perform deep breathing as directed by your health care provider. If you were   given a device called an incentive spirometer, use it to practice deep breathing several times a day. Support your chest with a pillow or your arms when you take deep breaths or cough. °· Keep  incision areas clean, dry, and protected. Remove or change any bandages (dressings) only as directed by your health care provider. You may have skin adhesive strips over the incision areas. Do not take the strips off. They will fall off on their own. °· Check incision areas daily for any swelling, redness, or drainage. °· If incisions were made in your legs, do the following: °· Avoid crossing your legs.   °· Avoid sitting for long periods of time. Change positions every 30 minutes.   °· Elevate your legs when you are sitting.   °· Wear compression stockings as directed by your health care provider. These stockings help keep blood clots from forming in your legs. °· Take showers once your health care provider approves. Until then, only take sponge baths. Pat incisions dry. Do not rub incisions with a washcloth or towel. Do not take tub baths or go swimming until your health care provider approves. °· Eat foods that are high in fiber, such as raw fruits and vegetables, whole grains, beans, and nuts. Meats should be lean cut. Avoid canned, processed, and fried foods. °· Drink enough fluids to keep your urine clear or pale yellow. °· Weigh yourself every day. This helps identify if you are retaining fluid that may make your heart and lungs work harder.   °· Rest and limit activity as directed by your health care provider. You may be instructed to: °· Stop any activity at once if you have chest pain, shortness of breath, irregular heartbeats, or dizziness. Get help right away if you have any of these symptoms. °· Move around frequently for short periods or take short walks as directed by your health care provider. Increase your activities gradually. You may need physical therapy or cardiac rehabilitation to help strengthen your muscles and build your endurance. °· Avoid lifting, pushing, or pulling anything heavier than 10 lb (4.5 kg) for at least 6 weeks after surgery. °· Do not drive until your health care provider  approves.  °· Ask your health care provider when you may return to work and resume sexual activity. °· Follow up with your health care provider as directed.   °SEEK MEDICAL CARE IF: °· You have swelling, redness, increasing pain, or drainage at the site of an incision.   °· You develop a fever.   °· You have swelling in your ankles or legs.   °· You have pain in your legs.   °· You have weight gain of 2 or more pounds a day. °· You are nauseous or vomit. °· You have diarrhea.  °SEEK IMMEDIATE MEDICAL CARE IF: °· You have chest pain that goes to your jaw or arms. °· You have shortness of breath.   °· You have a fast or irregular heartbeat.   °· You notice a "clicking" in your breastbone (sternum) when you move.   °· You have numbness or weakness in your arms or legs. °· You feel dizzy or lightheaded.   °MAKE SURE YOU: °· Understand these instructions. °· Will watch your condition. °· Will get help right away if you are not doing well or get worse. °Document Released: 07/18/2004 Document Revised: 08/31/2012 Document Reviewed: 06/07/2012 °ExitCare® Patient Information ©2014 [employer]. ° °

## 2013-02-10 NOTE — Progress Notes (Signed)
Pt ambulated 450 ft with RW and 2L O2 without complaints of pain, dizziness or SOB.  To recliner after walk with call bell in reach.  Will con't plan of care.

## 2013-02-11 LAB — CBC
HCT: 37.9 % — ABNORMAL LOW (ref 39.0–52.0)
HEMOGLOBIN: 12.6 g/dL — AB (ref 13.0–17.0)
MCH: 30.4 pg (ref 26.0–34.0)
MCHC: 33.2 g/dL (ref 30.0–36.0)
MCV: 91.5 fL (ref 78.0–100.0)
PLATELETS: 368 10*3/uL (ref 150–400)
RBC: 4.14 MIL/uL — AB (ref 4.22–5.81)
RDW: 13.8 % (ref 11.5–15.5)
WBC: 11.6 10*3/uL — ABNORMAL HIGH (ref 4.0–10.5)

## 2013-02-11 LAB — GLUCOSE, CAPILLARY
GLUCOSE-CAPILLARY: 127 mg/dL — AB (ref 70–99)
GLUCOSE-CAPILLARY: 89 mg/dL (ref 70–99)
GLUCOSE-CAPILLARY: 114 mg/dL — AB (ref 70–99)
Glucose-Capillary: 111 mg/dL — ABNORMAL HIGH (ref 70–99)
Glucose-Capillary: 119 mg/dL — ABNORMAL HIGH (ref 70–99)

## 2013-02-11 LAB — BASIC METABOLIC PANEL
BUN: 22 mg/dL (ref 6–23)
CALCIUM: 9.3 mg/dL (ref 8.4–10.5)
CO2: 27 meq/L (ref 19–32)
Chloride: 97 mEq/L (ref 96–112)
Creatinine, Ser: 0.95 mg/dL (ref 0.50–1.35)
GFR calc Af Amer: >90 mL/min (ref >90)
GLUCOSE: 118 mg/dL — AB (ref 70–99)
Potassium: 4.2 mEq/L (ref 3.7–5.3)
Sodium: 139 mEq/L (ref 137–147)

## 2013-02-11 LAB — TYPE AND SCREEN
ABO/RH(D): A NEG
Antibody Screen: NEGATIVE

## 2013-02-11 MED ORDER — METOPROLOL TARTRATE 25 MG PO TABS
37.5000 mg | ORAL_TABLET | Freq: Two times a day (BID) | ORAL | Status: DC
  Administered 2013-02-11 – 2013-02-13 (×5): 37.5 mg via ORAL
  Filled 2013-02-11 (×6): qty 1

## 2013-02-11 MED ORDER — METOPROLOL TARTRATE 25 MG/10 ML ORAL SUSPENSION
37.5000 mg | Freq: Two times a day (BID) | ORAL | Status: DC
  Filled 2013-02-11 (×6): qty 15

## 2013-02-11 NOTE — Progress Notes (Addendum)
      [address]       [place] [address]             [phone]        5 Days Post-Op Procedure(s) (LRB): CORONARY ARTERY BYPASS GRAFTING (CABG) with TEE (N/A)  Subjective: Patient is awake and alert, sitting in chair. He states he had a good day and night.Cough is much improved. He just had a bowel movement. He hopes to go home tomorrow.   Objective: Vital signs in last 24 hours: Temp:  [97.3 F (36.3 C)-97.8 F (36.6 C)] 97.8 F (36.6 C) (01/31 0313) Pulse Rate:  [91-99] 94 (01/31 0313) Cardiac Rhythm:  [-] Normal sinus rhythm;Sinus tachycardia (01/30 1944) Resp:  [18-24] 18 (01/31 0313) BP: (128-147)/(75-83) 141/83 mmHg (01/31 0313) SpO2:  [92 %-98 %] 92 % (01/31 0313) Weight:  [112.4 kg (247 lb 12.8 oz)] 112.4 kg (247 lb 12.8 oz) (01/31 0305)  Pre op weight  121 kg Current Weight  02/11/13 112.4 kg (247 lb 12.8 oz)      Intake/Output from previous day: 01/30 0701 - 01/31 0700 In: 1443 [P.O.:1440; I.V.:3] Out: 2 [Stool:2]   Physical Exam:  Cardiovascular: RRR Pulmonary: Diminished at bases; no rales, wheezes, or rhonchi. Abdomen: Soft, non tender, bowel sounds present. Extremities: No lower extremity edema. Wounds: RLE wound is clean and dry.  Lower portion of sternal wound has minor sero sanguinous ooze. No erythema. Likely from fat necrosis  Lab Results: CBC: Recent Labs  02/09/13 0251 02/11/13 0430  WBC 13.6* 11.6*  HGB 11.4* 12.6*  HCT 34.8* 37.9*  PLT 239 368   BMET:  Recent Labs  02/09/13 0251 02/11/13 0430  NA 135* 139  K 4.0 4.2  CL 96 97  CO2 28 27  GLUCOSE 147* 118*  BUN 24* 22  CREATININE 0.78 0.95  CALCIUM 8.5 9.3    PT/INR:  Lab Results  Component Value Date   INR 1.59* 02/06/2013   ABG:  INR: Will add last result for INR, ABG once components are confirmed Will add last 4 CBG results once components are confirmed  Assessment/Plan:  1. CV - SR/ST. On Lopressor 25 bid and Lisinopril 10. Will  increase Lopressor to 37.5 bid for better HR and BP control. 2.  Pulmonary - On 2 liters of oxygen via [place]. Wean as tolerates.Encourage incentive spirometer and flutter valve. 3. Remove EPW 4.  Acute blood loss anemia - H and H up to 12.6 and 37.9 5.DM-CBGs 134/127/111. On Metformin 1000 bid, Invokana,and insulin.  Check HGA1C 6. MRSA positive-on Bactrim 7. Possible discharge 1-2 days  ZIMMERMAN,DONIELLE MPA-C 02/11/2013,7:52 AM  Serous drainage on sternal dressing, no additional drainage with cough, watch wound carefully  No fever Sternum stable I have seen and examined Manuel Knight and agree with the above assessment  and plan.  Grace Isaac MD Beeper [phone] Office [phone] 02/11/2013 11:38 AM

## 2013-02-11 NOTE — Progress Notes (Signed)
EPWs removed per Md order and protocol. Wire ends intact. Pt tolerated procedure well. Pt resting in bed X 1 hour. Call bell within reach. Bed Alarm on . Vital signs stable, Will continue to monitor.

## 2013-02-11 NOTE — Progress Notes (Signed)
Nutrition Brief Note  Patient identified on the Malnutrition Screening Tool (MST) Report  Patient admitted with NSTEMI. He is s/p CABG. Likely discharge in 1-2 days. He reports intake is usually good. Lower intakes over the last few days related to food choices. We discussed menu options and patient encouraged to order foods per preference. Weight loss during hospital stay related to diuresis. Current weight within 2 pounds of reported usual body weight.   Wt Readings from Last 15 Encounters:  02/11/13 247 lb 12.8 oz (112.4 kg)  02/11/13 247 lb 12.8 oz (112.4 kg)  02/11/13 247 lb 12.8 oz (112.4 kg)  06/25/12 256 lb (116.121 kg)  05/22/11 243 lb 13.3 oz (110.6 kg)  12/15/10 264 lb 12.8 oz (120.112 kg)  01/21/10 280 lb (127.007 kg)  12/11/09 264 lb 12 oz (120.09 kg)  10/17/09 270 lb 12 oz (122.811 kg)  08/27/09 277 lb (125.646 kg)  06/24/09 277 lb 4 oz (125.76 kg)  03/25/09 266 lb 8 oz (120.884 kg)  01/25/09 273 lb 4 oz (123.945 kg)  12/19/08 263 lb (119.296 kg)  12/04/08 271 lb 12 oz (123.265 kg)    Body mass index is 36.58 kg/(m^2). Patient meets criteria for Obesity, Class II based on current BMI.   Current diet order is Heart Healthy, patient is consuming approximately 50-100% of meals at this time. Labs and medications reviewed.   No nutrition interventions warranted at this time. If nutrition issues arise, please consult RD.   Larey Seat, RD, LDN Pager #: [phone] After-Hours Pager #: [phone]

## 2013-02-11 NOTE — Progress Notes (Addendum)
CARDIAC REHAB PHASE I   PRE:  Rate/Rhythm: 116 ST  BP:  Supine:   Sitting: 140/90  Standing:    SaO2: 99 2L 95 RA   MODE:  Ambulation: 550 ft   POST:  Rate/Rhythm: 118 ST  BP:  Supine:   Sitting: 160/90  Standing:    SaO2: 96 RA in hall 97 RA after walk 0940-1035  On arrival pt on O2 2L sat 995, O2 d/c, RA sat 95%. Assisted X 1 to ambulate. Gait mostly steady. Pt lost balance X 1 but was able to correct self. RA sat in hall 96% after walk 97%. Pt to recliner after walk. Completed discharge education with pt. He voives understanding. Pt agrees to [place]. CRP in [place], will send referral. Put recovery from heart surgery video for pt to watch.  Rodney Langton RN 02/11/2013 10:48 AM

## 2013-02-12 LAB — BASIC METABOLIC PANEL
BUN: 20 mg/dL (ref 6–23)
CO2: 24 mEq/L (ref 19–32)
Calcium: 9 mg/dL (ref 8.4–10.5)
Chloride: 103 mEq/L (ref 96–112)
Creatinine, Ser: 0.93 mg/dL (ref 0.50–1.35)
GFR calc Af Amer: >90 mL/min (ref >90)
GFR calc non Af Amer: >90 mL/min (ref >90)
Glucose, Bld: 118 mg/dL — ABNORMAL HIGH (ref 70–99)
Potassium: 4.3 mEq/L (ref 3.7–5.3)
Sodium: 140 mEq/L (ref 137–147)

## 2013-02-12 LAB — CBC
HCT: 36.5 % — ABNORMAL LOW (ref 39.0–52.0)
Hemoglobin: 12.3 g/dL — ABNORMAL LOW (ref 13.0–17.0)
MCH: 30.3 pg (ref 26.0–34.0)
MCHC: 33.7 g/dL (ref 30.0–36.0)
MCV: 89.9 fL (ref 78.0–100.0)
Platelets: 385 10*3/uL (ref 150–400)
RBC: 4.06 MIL/uL — ABNORMAL LOW (ref 4.22–5.81)
RDW: 13.6 % (ref 11.5–15.5)
WBC: 12.2 10*3/uL — ABNORMAL HIGH (ref 4.0–10.5)

## 2013-02-12 LAB — GLUCOSE, CAPILLARY
GLUCOSE-CAPILLARY: 118 mg/dL — AB (ref 70–99)
GLUCOSE-CAPILLARY: 141 mg/dL — AB (ref 70–99)
GLUCOSE-CAPILLARY: 125 mg/dL — AB (ref 70–99)
GLUCOSE-CAPILLARY: 107 mg/dL — AB (ref 70–99)

## 2013-02-12 LAB — HEMOGLOBIN A1C
Hgb A1c MFr Bld: 9.1 % — ABNORMAL HIGH (ref <5.7)
MEAN PLASMA GLUCOSE: 214 mg/dL — AB (ref <117)

## 2013-02-12 MED ORDER — INSULIN DETEMIR 100 UNIT/ML ~~LOC~~ SOLN
6.0000 [IU] | Freq: Two times a day (BID) | SUBCUTANEOUS | Status: DC
  Administered 2013-02-12 – 2013-02-13 (×3): 6 [IU] via SUBCUTANEOUS
  Filled 2013-02-12 (×4): qty 0.06

## 2013-02-12 MED ORDER — LISINOPRIL 20 MG PO TABS
20.0000 mg | ORAL_TABLET | Freq: Every day | ORAL | Status: DC
  Administered 2013-02-12 – 2013-02-13 (×2): 20 mg via ORAL
  Filled 2013-02-12 (×2): qty 1

## 2013-02-12 MED ORDER — CEPHALEXIN 500 MG PO CAPS
500.0000 mg | ORAL_CAPSULE | Freq: Three times a day (TID) | ORAL | Status: DC
  Administered 2013-02-12: 500 mg via ORAL
  Filled 2013-02-12 (×3): qty 1

## 2013-02-12 NOTE — Progress Notes (Signed)
Sternum with scant serosanguenous drainage approximately midway down incision. No signs of redness, tenderness, or purulent discharge. Incision covered with dry gauze.

## 2013-02-12 NOTE — Progress Notes (Addendum)
      [address]       [place] [address]             [phone]        6 Days Post-Op Procedure(s) (LRB): CORONARY ARTERY BYPASS GRAFTING (CABG) with TEE (N/A)  Subjective: Patient has no complaints and he really wants to go home.  Objective: Vital signs in last 24 hours: Temp:  [98.1 F (36.7 C)-98.3 F (36.8 C)] 98.3 F (36.8 C) (02/01 0348) Pulse Rate:  [79-99] 79 (02/01 0348) Cardiac Rhythm:  [-] Normal sinus rhythm;Sinus tachycardia (01/31 2014) Resp:  [18] 18 (02/01 0348) BP: (143-159)/(69-75) 159/75 mmHg (02/01 0348) SpO2:  [94 %-100 %] 97 % (02/01 0348) Weight:  [110.9 kg (244 lb 7.8 oz)] 110.9 kg (244 lb 7.8 oz) (02/01 0552)  Pre op weight  121 kg Current Weight  02/12/13 110.9 kg (244 lb 7.8 oz)     Intake/Output from previous day: 01/31 0701 - 02/01 0700 In: 120 [P.O.:120] Out: -    Physical Exam:  Cardiovascular: RRR Pulmonary: Diminished at bases; no rales, wheezes, or rhonchi. Abdomen: Soft, non tender, bowel sounds present. Extremities: No lower extremity edema. Wounds: RLE wound is clean and dry.  Mid and ower portion of sternal wound has minor sero sanguinous ooze. No erythema and no sternal instability.Likely from fat necrosis  Lab Results: CBC:  Recent Labs  02/11/13 0430 02/12/13 0342  WBC 11.6* 12.2*  HGB 12.6* 12.3*  HCT 37.9* 36.5*  PLT 368 385   BMET:   Recent Labs  02/11/13 0430 02/12/13 0342  NA 139 140  K 4.2 4.3  CL 97 103  CO2 27 24  GLUCOSE 118* 118*  BUN 22 20  CREATININE 0.95 0.93  CALCIUM 9.3 9.0    PT/INR:  Lab Results  Component Value Date   INR 1.59* 02/06/2013   ABG:  INR: Will add last result for INR, ABG once components are confirmed Will add last 4 CBG results once components are confirmed  Assessment/Plan:  1. CV - SR. On Lopressor 37.5 bid and Lisinopril 10. Still hypertensive so will increase Lisinopril to 20 daily. 2.  Pulmonary - Encourage incentive spirometer and  flutter valve. 3. He remains afebrile.WBC slightly increased from 11.6 to 12.2. Continues to have sero sanguinous drainage from sternum. Will start Keflex 500 tid. 4.  Acute blood loss anemia - H and H stable at 12.3and 36.5 5.DM-CBGs 89/114/118. On Metformin 1000 bid, Invokana,and insulin.  Decrease insulin to avoid hypoglycemia 6. MRSA positive-on Bactrim 7. Possibledischarge in am  ZIMMERMAN,DONIELLE MPA-C 02/12/2013,7:45 AM  sternum stable wound intact but small amt sereor drainage Check wbc and wound in am I have seen and examined [name] and agree with the above assessment  and plan.  Grace Isaac MD Beeper [phone] Office [phone] 02/12/2013 12:01 PM

## 2013-02-13 LAB — GLUCOSE, CAPILLARY: GLUCOSE-CAPILLARY: 91 mg/dL (ref 70–99)

## 2013-02-13 MED ORDER — ATORVASTATIN CALCIUM 80 MG PO TABS: 80.0000 mg | ORAL_TABLET | Freq: Every day | ORAL | Status: DC

## 2013-02-13 MED ORDER — ALPRAZOLAM 0.25 MG PO TABS: 0.2500 mg | ORAL_TABLET | Freq: Four times a day (QID) | ORAL | Status: DC

## 2013-02-13 MED ORDER — METOPROLOL TARTRATE 50 MG PO TABS: 50.0000 mg | ORAL_TABLET | Freq: Two times a day (BID) | ORAL | Status: DC

## 2013-02-13 MED ORDER — OXYCODONE HCL 5 MG PO TABS: 5.0000 mg | ORAL_TABLET | Freq: Four times a day (QID) | ORAL | Status: DC | PRN

## 2013-02-13 MED ORDER — SULFAMETHOXAZOLE-TMP DS 800-160 MG PO TABS: 1.0000 | ORAL_TABLET | Freq: Two times a day (BID) | ORAL | Status: DC

## 2013-02-13 MED ORDER — ASPIRIN 325 MG PO TBEC: 325.0000 mg | DELAYED_RELEASE_TABLET | Freq: Every day | ORAL | Status: DC

## 2013-02-13 NOTE — Progress Notes (Addendum)
[address]       [place] [address]             [phone]      7 Days Post-Op  Procedure(s) (LRB): CORONARY ARTERY BYPASS GRAFTING (CABG) with TEE (N/A) Subjective: Feels ok, no new C/O, afebrile , scant drainage drom incis, non-purulent  Objective  Telemetry sinus rhythm Temp:  [97.9 F (36.6 C)-98.3 F (36.8 C)] 98.3 F (36.8 C) (02/02 0426) Pulse Rate:  [73-91] 73 (02/02 0426) Resp:  [18-19] 18 (02/02 0426) BP: (143-170)/(70-81) 146/73 mmHg (02/02 0426) SpO2:  [94 %-99 %] 94 % (02/02 0426) Weight:  [242 lb 1 oz (109.8 kg)] 242 lb 1 oz (109.8 kg) (02/02 0426)   Intake/Output Summary (Last 24 hours) at 02/13/13 8938 Last data filed at 02/12/13 2045  Gross per 24 hour  Intake    370 ml  Output      0 ml  Net    370 ml       General appearance: alert, cooperative and no distress Heart: regular rate and rhythm Lungs: mildly dim in bases Abdomen: benign Extremities: no edema Wound: incis ok, scant serosang drainage lower pole of sternal incis. No erethema, no purulence, sternum is stable  Lab Results:  Recent Labs  02/11/13 0430 02/12/13 0342  NA 139 140  K 4.2 4.3  CL 97 103  CO2 27 24  GLUCOSE 118* 118*  BUN 22 20  CREATININE 0.95 0.93  CALCIUM 9.3 9.0   No results found for this basename: AST, ALT, ALKPHOS, BILITOT, PROT, ALBUMIN,  in the last 72 hours No results found for this basename: LIPASE, AMYLASE,  in the last 72 hours  Recent Labs  02/11/13 0430 02/12/13 0342  WBC 11.6* 12.2*  HGB 12.6* 12.3*  HCT 37.9* 36.5*  MCV 91.5 89.9  PLT 368 385   No results found for this basename: CKTOTAL, CKMB, TROPONINI,  in the last 72 hours No components found with this basename: POCBNP,  No results found for this basename: DDIMER,  in the last 72 hours  Recent Labs  02/12/13 0342  HGBA1C 9.1*   No results found for this basename: CHOL, HDL, LDLCALC, TRIG, CHOLHDL,  in the last 72 hours No results found for this  basename: TSH, T4TOTAL, FREET3, T3FREE, THYROIDAB,  in the last 72 hours No results found for this basename: VITAMINB12, FOLATE, FERRITIN, TIBC, IRON, RETICCTPCT,  in the last 72 hours  Medications: Scheduled . acetaminophen (TYLENOL) oral liquid 160 mg/5 mL  650 mg Per Tube Once   Or  . acetaminophen  650 mg Rectal Once  . ALPRAZolam  0.25 mg Oral QID  . amphetamine-dextroamphetamine  60 mg Oral Q breakfast  . aspirin EC  325 mg Oral Daily   Or  . aspirin  324 mg Per Tube Daily  . atorvastatin  80 mg Oral q1800  . bisacodyl  10 mg Oral Daily   Or  . bisacodyl  10 mg Rectal Daily  . Canagliflozin  100 mg Oral Daily  . docusate sodium  200 mg Oral Daily  . DULoxetine  30 mg Oral Daily  . enoxaparin (LOVENOX) injection  40 mg Subcutaneous QHS  . guaiFENesin  600 mg Oral BID  . insulin aspart  0-24 Units Subcutaneous TID AC & HS  . insulin detemir  6 Units Subcutaneous BID  . lisinopril  20 mg Oral Daily  . metFORMIN  1,000 mg Oral BID WC  .  metoprolol tartrate  37.5 mg Oral BID   Or  . metoprolol tartrate  37.5 mg Per Tube BID  . mometasone-formoterol  2 puff Inhalation BID  . pantoprazole  40 mg Oral Daily  . sodium chloride  3 mL Intravenous Q12H  . sodium chloride  3 mL Intravenous Q12H  . sulfamethoxazole-trimethoprim  1 tablet Oral Q12H  . tiotropium  18 mcg Inhalation Daily     Radiology/Studies:  No results found.  INR: Will add last result for INR, ABG once components are confirmed Will add last 4 CBG results once components are confirmed  Assessment/Plan: S/P Procedure(s) (LRB): CORONARY ARTERY BYPASS GRAFTING (CABG) with TEE (N/A) Plan for discharge: see discharge orders BP elevated- resume home norvasc, increase lopressor to 50 bid Resume home glucose- will need primary care follow up - HGBA1C= 9.1  LOS: 10 days    GOLD,WAYNE E 2/2/20157:28 AM   Chart reviewed, patient examined, agree with above. Incision looks ok with small amount of serosanguinous  drainage from one spot. No erythema.

## 2013-02-13 NOTE — Progress Notes (Signed)
Discharge instructions along with med list and scripts provided. IV d/c'd with catheter intact. Betadine and gauze provided to patient for daily change to midline incision per MD request. Chest tube sutures removed with sites intact; steri-strips applied. Belongings with patient and family. Patient transported via wheelchair to lobby for d/c home.Manuel Knight

## 2013-02-20 ENCOUNTER — Telehealth: Payer: Self-pay | Admitting: Cardiology

## 2013-02-20 NOTE — Telephone Encounter (Signed)
Pt returned call from phone tree calling to confirm appt. Pt states that he was unaware of appt and doesn't know why he was operated on. Pt stated that he was unaware of any heart conditions that he had prior to this. I informed pt that it was very important to keep this appt especially since he is unsure of why he had the procedure pt verbalized understanding.

## 2013-02-23 ENCOUNTER — Encounter: Payer: Self-pay | Admitting: Cardiovascular Disease

## 2013-02-23 ENCOUNTER — Ambulatory Visit (INDEPENDENT_AMBULATORY_CARE_PROVIDER_SITE_OTHER): Payer: Medicare Other | Admitting: Cardiovascular Disease

## 2013-02-23 VITALS — BP 149/89 | HR 103 | Ht 69.0 in | Wt 247.0 lb

## 2013-02-23 DIAGNOSIS — I2589 Other forms of chronic ischemic heart disease: Secondary | ICD-10-CM

## 2013-02-23 DIAGNOSIS — I252 Old myocardial infarction: Secondary | ICD-10-CM

## 2013-02-23 DIAGNOSIS — I2510 Atherosclerotic heart disease of native coronary artery without angina pectoris: Secondary | ICD-10-CM

## 2013-02-23 DIAGNOSIS — G609 Hereditary and idiopathic neuropathy, unspecified: Secondary | ICD-10-CM

## 2013-02-23 DIAGNOSIS — E785 Hyperlipidemia, unspecified: Secondary | ICD-10-CM

## 2013-02-23 DIAGNOSIS — Z951 Presence of aortocoronary bypass graft: Secondary | ICD-10-CM

## 2013-02-23 DIAGNOSIS — I5022 Chronic systolic (congestive) heart failure: Secondary | ICD-10-CM

## 2013-02-23 DIAGNOSIS — G5793 Unspecified mononeuropathy of bilateral lower limbs: Secondary | ICD-10-CM

## 2013-02-23 DIAGNOSIS — I25810 Atherosclerosis of coronary artery bypass graft(s) without angina pectoris: Secondary | ICD-10-CM

## 2013-02-23 DIAGNOSIS — I10 Essential (primary) hypertension: Secondary | ICD-10-CM

## 2013-02-23 DIAGNOSIS — I255 Ischemic cardiomyopathy: Secondary | ICD-10-CM

## 2013-02-23 MED ORDER — GABAPENTIN 300 MG PO CAPS: 300.0000 mg | ORAL_CAPSULE | Freq: Three times a day (TID) | ORAL | Status: DC

## 2013-02-23 MED ORDER — METOPROLOL TARTRATE 50 MG PO TABS: 75.0000 mg | ORAL_TABLET | Freq: Two times a day (BID) | ORAL | Status: DC

## 2013-02-23 NOTE — Progress Notes (Signed)
Patient ID: Manuel Knight, male   DOB: 02-Oct-1960, 53 y.o.   MRN: [id number]      SUBJECTIVE: The patient is a 53 yr old male with diabetes, hyperlipidemia, hypertension, COPD, and CAD. He recently sustained a non-ST elevation myocardial infarction and ultimately underwent 3-vessel coronary artery bypass graft surgery on 1/26, with a LIMA to the LAD, SVG to OM1, and SVG to RCA. Left ventriculography revealed an EF of 30% and LVEDP was 20 mmHg. He says he feels no better than prior to his surgery. He says he feels weaker. He has severe bilateral feet neuropathy. He denies chest pain. He has bilateral wrist pain. He denies shortness of breath. He [place] fle[place] 3 times a week but is limited by his neuropathy. He says he used to be on Neurontin but this was stopped. He used to see Dr. Tula Nakayama who he really likes but has not been able to see her in quite some time.    Allergies  Allergen Reactions  . Tylenol [Acetaminophen] Hives  . Propoxyphene N-Acetaminophen Other (See Comments)    unknown  . Lodine [Etodolac] Swelling and Rash    Current Outpatient Prescriptions  Medication Sig Dispense Refill  . ALPRAZolam (XANAX) 0.25 MG tablet Take 1 tablet (0.25 mg total) by mouth 4 (four) times daily.  120 tablet  0  . amLODipine (NORVASC) 10 MG tablet Take 10 mg by mouth daily.      Marland Kitchen amphetamine-dextroamphetamine (ADDERALL) 20 MG tablet Take 60 mg by mouth daily.      Marland Kitchen aspirin EC 325 MG EC tablet Take 1 tablet (325 mg total) by mouth daily.      Marland Kitchen atorvastatin (LIPITOR) 80 MG tablet Take 1 tablet (80 mg total) by mouth daily at 6 PM.  30 tablet  1  . benazepril (LOTENSIN) 20 MG tablet Take 20 mg by mouth daily.      . Canagliflozin (INVOKANA) 100 MG TABS Take 100 mg by mouth daily.      . DULoxetine (CYMBALTA) 30 MG capsule Take 30 mg by mouth daily.      Marland Kitchen esomeprazole (NEXIUM) 40 MG capsule Take 40 mg by mouth daily at 12 noon.      . Fluticasone-Salmeterol (ADVAIR  DISKUS) 500-50 MCG/DOSE AEPB Inhale 1 puff into the lungs every 12 (twelve) hours.        . insulin aspart (NOVOLOG FLEXPEN) 100 UNIT/ML FlexPen Inject 0-26 Units into the skin 3 (three) times daily with meals. 20 units three times a day with sliding scale max of 26 units      . metFORMIN (GLUCOPHAGE) 1000 MG tablet Take 1,000 mg by mouth 2 (two) times daily with a meal.      . metoprolol tartrate (LOPRESSOR) 50 MG tablet Take 1 tablet (50 mg total) by mouth 2 (two) times daily.  60 tablet  1  . oxyCODONE (OXY IR/ROXICODONE) 5 MG immediate release tablet Take 1-2 tablets (5-10 mg total) by mouth every 6 (six) hours as needed for moderate pain.  50 tablet  0  . sulfamethoxazole-trimethoprim (BACTRIM DS) 800-160 MG per tablet Take 1 tablet by mouth every 12 (twelve) hours.  12 tablet  0  . tiotropium (SPIRIVA) 18 MCG inhalation capsule Place 18 mcg into inhaler and inhale daily.         No current facility-administered medications for this visit.    Past Medical History  Diagnosis Date  . Anxiety   . Pulmonary nodule   .  Hyperlipidemia   . COPD (chronic obstructive pulmonary disease)   . Chronic back pain   . Obesity   . Diabetes mellitus, type 2   . Hypertension   . Cellulitis 2011    of neck   . GERD (gastroesophageal reflux disease)   . Claustrophobia   . Diverticulitis of colon 6644    complicated by abscess  . Heart attack     Past Surgical History  Procedure Laterality Date  . Appendectomy  2007    gangrenous appendicitis   . Tonsillectomy    . Coronary artery bypass graft N/A 02/06/2013    Procedure: CORONARY ARTERY BYPASS GRAFTING (CABG) with TEE;  Surgeon: [name], MD;  Location: [facility];  Service: Open Heart Surgery;  Laterality: N/A;  CABG x three,  using left internal mammary artery and right leg greater saphenous vein harvested endoscopically    History   Social History  . Marital Status: Single    Spouse Name: N/A    Number of Children: 2tw  . Years of  Education: N/A   Occupational History  . Disabled    Social History Main Topics  . Smoking status: Former Smoker -- 1.50 packs/day    Types: Cigarettes    Quit date: 01/13/1988  . Smokeless tobacco: Never Used  . Alcohol Use: No     Comment:    . Drug Use: No     Comment: Formerly used marijuana for the pain, denies now.  . Sexual Activity: Not on file   Other Topics Concern  . Not on file   Social History Narrative   Lives with significant other of 18 years. They are raising his grandson.     Filed Vitals:   02/23/13 1516  BP: 149/89  Pulse: 103  Height: 5\' 9"  (1.753 m)  Weight: 247 lb (112.038 kg)  SpO2: 99%    PHYSICAL EXAM General: NAD Neck: No JVD, no thyromegaly or thyroid nodule.  Lungs: Clear to auscultation bilaterally with normal respiratory effort. CV: Nondisplaced PMI.  Healing midline incision, no drainage. Heart regular rhythm, slightly tachycardic, normal S1/S2, no S3/S4, no murmur.  No peripheral edema.  No carotid bruit.  Normal pedal pulses.  Abdomen: Soft, nontender, no hepatosplenomegaly, no distention.  Neurologic: Alert and oriented x 3.  Psych: Normal affect. Extremities: No clubbing or cyanosis.   ECG: reviewed and available in electronic records.   1. Normal left main coronary artery. 2. Severe disease in in the mid left anterior descending artery, up to 80%. Significant disease in a medium-sized first diagonal. 3. Patent left circumflex artery. OM1 with 80% stenosis. 4. Long segment of moderate to severe disease in the proximal to mid right coronary artery, up to 90%. This is likely the culprit for his presentation today.  5. Decreased left ventricular systolic function. LVEDP 20 mmHg. Ejection fraction 30%.      ASSESSMENT AND PLAN: 1. CAD s/p 3-vessel CABG: continue ASA and high-dose atorvastatin. Increase metoprolol to 75 mg bid due to resting tachycardia. 2. Ischemic cardiomyopathy, EF 30%: no evidence of heart failure. Renal  function is currently normal. Will not prescribe a diuretic at present. Will need to reassess LV function in future (at least 3 months). 3. HTN: uncontrolled on present therapy. Will increase metoprolol given his resting tachycardia. 4. Hyperlipidemia: continue high dose Lipitor 80 mg daily. 5. Bilateral feet neuropathy: will prescribe gabapentin 300 mg tid.  Dispo: f/u 2-3 months.  Kate Sable, M.D., F.A.C.C.

## 2013-02-23 NOTE — Patient Instructions (Addendum)
Your physician recommends that you schedule a follow-up appointment in: 2-3 months with Dr. Bronson Ing. This appointment will be scheduled today before you leave.  Your physician has recommended you make the following change in your medication:  Increase Metoprolol 50 MG take 1 1/2 tablet by mouth to equal 75 MG twice daily. Start: Gabapentin (Neurotonin) 300 MG take 1 tablet by mouth three times a day.  Continue all other medications the same.   Your physician has referred you to cardiac rehab.

## 2013-02-24 ENCOUNTER — Other Ambulatory Visit: Payer: Self-pay | Admitting: *Deleted

## 2013-02-24 DIAGNOSIS — I2510 Atherosclerotic heart disease of native coronary artery without angina pectoris: Secondary | ICD-10-CM

## 2013-02-26 NOTE — Addendum Note (Signed)
Addendum created 02/26/13 1906 by Roberts Gaudy, MD   Modules edited: Anesthesia Attestations

## 2013-03-01 ENCOUNTER — Ambulatory Visit: Payer: Medicare Other | Admitting: Surgery

## 2013-03-03 ENCOUNTER — Other Ambulatory Visit: Payer: Self-pay | Admitting: *Deleted

## 2013-03-03 DIAGNOSIS — I2510 Atherosclerotic heart disease of native coronary artery without angina pectoris: Secondary | ICD-10-CM

## 2013-03-08 ENCOUNTER — Encounter: Payer: Self-pay | Admitting: Surgery

## 2013-03-08 ENCOUNTER — Ambulatory Visit
Admission: RE | Admit: 2013-03-08 | Discharge: 2013-03-08 | Disposition: A | Discharge status: Home / Self Care | Payer: Medicare Other | Source: Ambulatory Visit | Attending: Surgery | Admitting: Surgery

## 2013-03-08 ENCOUNTER — Ambulatory Visit (INDEPENDENT_AMBULATORY_CARE_PROVIDER_SITE_OTHER): Payer: Medicare Other | Admitting: Surgery

## 2013-03-08 VITALS — BP 149/92 | HR 80 | Temp 97.0°F | Resp 18 | Ht 69.0 in | Wt 242.0 lb

## 2013-03-08 DIAGNOSIS — I2510 Atherosclerotic heart disease of native coronary artery without angina pectoris: Secondary | ICD-10-CM

## 2013-03-08 DIAGNOSIS — Z951 Presence of aortocoronary bypass graft: Secondary | ICD-10-CM

## 2013-03-08 NOTE — Progress Notes (Signed)
HPI:  Patient returns for routine postoperative follow-up having undergone CABG x 3  on 02/06/2013. The patient's early postoperative recovery while in the hospital was notable for an uncomplicated postop course. Since hospital discharge the patient reports that he has been feeling well. He is walking daily without chest pain or shortness of breath.   Current Outpatient Prescriptions  Medication Sig Dispense Refill  . ALPRAZolam (XANAX) 0.25 MG tablet Take 1 tablet (0.25 mg total) by mouth 4 (four) times daily.  120 tablet  0  . amLODipine (NORVASC) 10 MG tablet Take 10 mg by mouth daily.      Marland Kitchen amphetamine-dextroamphetamine (ADDERALL) 20 MG tablet Take 60 mg by mouth daily.      Marland Kitchen aspirin EC 325 MG EC tablet Take 1 tablet (325 mg total) by mouth daily.      Marland Kitchen atorvastatin (LIPITOR) 80 MG tablet Take 1 tablet (80 mg total) by mouth daily at 6 PM.  30 tablet  1  . benazepril (LOTENSIN) 20 MG tablet Take 20 mg by mouth daily.      . Canagliflozin (INVOKANA) 100 MG TABS Take 100 mg by mouth daily.      . DULoxetine (CYMBALTA) 30 MG capsule Take 30 mg by mouth daily.      Marland Kitchen esomeprazole (NEXIUM) 40 MG capsule Take 40 mg by mouth daily at 12 noon.      . Fluticasone-Salmeterol (ADVAIR DISKUS) 500-50 MCG/DOSE AEPB Inhale 1 puff into the lungs every 12 (twelve) hours.        . gabapentin (NEURONTIN) 300 MG capsule Take 1 capsule (300 mg total) by mouth 3 (three) times daily.  90 capsule  3  . insulin aspart (NOVOLOG FLEXPEN) 100 UNIT/ML FlexPen Inject 0-26 Units into the skin 3 (three) times daily with meals. 20 units three times a day with sliding scale max of 26 units      . metFORMIN (GLUCOPHAGE) 1000 MG tablet Take 1,000 mg by mouth 2 (two) times daily with a meal.      . metoprolol (LOPRESSOR) 50 MG tablet Take 1.5 tablets (75 mg total) by mouth 2 (two) times daily.  90 tablet  6  . tiotropium (SPIRIVA) 18 MCG inhalation capsule Place 18 mcg into inhaler and inhale daily.        Marland Kitchen  oxyCODONE (OXY IR/ROXICODONE) 5 MG immediate release tablet Take 1-2 tablets (5-10 mg total) by mouth every 6 (six) hours as needed for moderate pain.  50 tablet  0   No current facility-administered medications for this visit.    Physical Exam: BP 149/92  Pulse 80  Temp(Src) 97 F (36.1 C) (Oral)  Resp 18  Ht 5\' 9"  (1.753 m)  Wt 242 lb (109.77 kg)  BMI 35.72 kg/m2  SpO2 98% He looks well. Lung exam is clear. Cardiac exam shows a regular rate and rhythm with normal heart sounds. Chest incision is healing well and sternum is stable. The leg incisions are healing well and there is no peripheral edema.    Diagnostic Tests:  CLINICAL DATA: CABG  EXAM:  CHEST 2 VIEW  COMPARISON: 02/09/2013  FINDINGS:  Mild cardiac enlargement without heart failure. Improved aeration in  the lung bases compared with the prior study. Negative for effusion.  Negative for pneumonia.  IMPRESSION:  Improved aeration in the lung bases. No acute abnormality  Electronically Signed  By: Franchot Gallo M.D.  On: 03/08/2013 12:12   Impression:  Overall I think he is doing  well. I encouraged him to continue walking. He is planning to participate in cardiac rehab. I told him he could drive his car but should not lift anything heavier than 10 lbs for three months postop.   Plan:  He will continue to follow-up with cardiology and will contact me if he develops any problems with his incisions.

## 2013-03-16 ENCOUNTER — Other Ambulatory Visit: Payer: Self-pay | Admitting: Surgical

## 2013-03-16 ENCOUNTER — Telehealth: Payer: Self-pay | Admitting: Cardiovascular Disease

## 2013-03-16 NOTE — Telephone Encounter (Signed)
Per staff at [facility], patient has been d/c from their practice and can' t not be accepted back.

## 2013-03-16 NOTE — Telephone Encounter (Signed)
Patient informed. 

## 2013-03-16 NOTE — Telephone Encounter (Signed)
Patient discharged from [facility] 02/13/10 for non-compliance.  He is not eligible to re-establish with [facility].

## 2013-03-16 NOTE — Telephone Encounter (Signed)
Mr. Hilligoss called stating that on his last visit with Dr. Bronson Ing he was told that We would refer him back to Dr.Simpson to re-establish. Need to add referral in EP[facility].

## 2013-03-27 ENCOUNTER — Emergency Department (HOSPITAL_COMMUNITY): Payer: Medicare Other

## 2013-03-27 ENCOUNTER — Encounter (HOSPITAL_COMMUNITY): Payer: Self-pay | Admitting: Emergency Medicine

## 2013-03-27 ENCOUNTER — Emergency Department (HOSPITAL_COMMUNITY)
Admission: EM | Admit: 2013-03-27 | Discharge: 2013-03-27 | Disposition: A | Discharge status: Home / Self Care | Payer: Medicare Other | Attending: Emergency Medicine | Admitting: Emergency Medicine

## 2013-03-27 DIAGNOSIS — J4489 Other specified chronic obstructive pulmonary disease: Secondary | ICD-10-CM | POA: Insufficient documentation

## 2013-03-27 DIAGNOSIS — L089 Local infection of the skin and subcutaneous tissue, unspecified: Secondary | ICD-10-CM

## 2013-03-27 DIAGNOSIS — J449 Chronic obstructive pulmonary disease, unspecified: Secondary | ICD-10-CM | POA: Insufficient documentation

## 2013-03-27 DIAGNOSIS — M549 Dorsalgia, unspecified: Secondary | ICD-10-CM | POA: Insufficient documentation

## 2013-03-27 DIAGNOSIS — E119 Type 2 diabetes mellitus without complications: Secondary | ICD-10-CM | POA: Insufficient documentation

## 2013-03-27 DIAGNOSIS — E669 Obesity, unspecified: Secondary | ICD-10-CM | POA: Insufficient documentation

## 2013-03-27 DIAGNOSIS — Z794 Long term (current) use of insulin: Secondary | ICD-10-CM | POA: Insufficient documentation

## 2013-03-27 DIAGNOSIS — F411 Generalized anxiety disorder: Secondary | ICD-10-CM | POA: Insufficient documentation

## 2013-03-27 DIAGNOSIS — Z87891 Personal history of nicotine dependence: Secondary | ICD-10-CM | POA: Insufficient documentation

## 2013-03-27 DIAGNOSIS — Z8719 Personal history of other diseases of the digestive system: Secondary | ICD-10-CM | POA: Insufficient documentation

## 2013-03-27 DIAGNOSIS — Z7982 Long term (current) use of aspirin: Secondary | ICD-10-CM | POA: Insufficient documentation

## 2013-03-27 DIAGNOSIS — Z79899 Other long term (current) drug therapy: Secondary | ICD-10-CM | POA: Insufficient documentation

## 2013-03-27 DIAGNOSIS — I10 Essential (primary) hypertension: Secondary | ICD-10-CM | POA: Insufficient documentation

## 2013-03-27 DIAGNOSIS — G8929 Other chronic pain: Secondary | ICD-10-CM | POA: Insufficient documentation

## 2013-03-27 DIAGNOSIS — E785 Hyperlipidemia, unspecified: Secondary | ICD-10-CM | POA: Insufficient documentation

## 2013-03-27 LAB — CBC WITH DIFFERENTIAL/PLATELET
BASOS ABS: 0.1 10*3/uL (ref 0.0–0.1)
BASOS PCT: 1 % (ref 0–1)
EOS ABS: 0.2 10*3/uL (ref 0.0–0.7)
Eosinophils Relative: 3 % (ref 0–5)
HCT: 43.5 % (ref 39.0–52.0)
Hemoglobin: 14.3 g/dL (ref 13.0–17.0)
Lymphocytes Relative: 29 % (ref 12–46)
Lymphs Abs: 2.4 10*3/uL (ref 0.7–4.0)
MCH: 29.9 pg (ref 26.0–34.0)
MCHC: 32.9 g/dL (ref 30.0–36.0)
MCV: 90.8 fL (ref 78.0–100.0)
Monocytes Absolute: 0.8 10*3/uL (ref 0.1–1.0)
Monocytes Relative: 10 % (ref 3–12)
NEUTROS ABS: 4.7 10*3/uL (ref 1.7–7.7)
NEUTROS PCT: 58 % (ref 43–77)
Platelets: 273 10*3/uL (ref 150–400)
RBC: 4.79 MIL/uL (ref 4.22–5.81)
RDW: 13.4 % (ref 11.5–15.5)
WBC: 8.1 10*3/uL (ref 4.0–10.5)

## 2013-03-27 LAB — BASIC METABOLIC PANEL
BUN: 15 mg/dL (ref 6–23)
CALCIUM: 9.6 mg/dL (ref 8.4–10.5)
CO2: 30 mEq/L (ref 19–32)
CREATININE: 0.76 mg/dL (ref 0.50–1.35)
Chloride: 100 mEq/L (ref 96–112)
GFR calc Af Amer: >90 mL/min (ref >90)
Glucose, Bld: 301 mg/dL — ABNORMAL HIGH (ref 70–99)
Potassium: 4.7 mEq/L (ref 3.7–5.3)
Sodium: 139 mEq/L (ref 137–147)

## 2013-03-27 LAB — CBG MONITORING, ED: Glucose-Capillary: 269 mg/dL — ABNORMAL HIGH (ref 70–99)

## 2013-03-27 MED ORDER — BACITRACIN-NEOMYCIN-POLYMYXIN 400-5-5000 EX OINT
TOPICAL_OINTMENT | Freq: Once | CUTANEOUS | Status: AC
  Administered 2013-03-27: 1 via TOPICAL
  Filled 2013-03-27: qty 1

## 2013-03-27 MED ORDER — ONDANSETRON HCL 4 MG PO TABS
4.0000 mg | ORAL_TABLET | Freq: Once | ORAL | Status: AC
  Administered 2013-03-27: 4 mg via ORAL
  Filled 2013-03-27: qty 1

## 2013-03-27 MED ORDER — DOXYCYCLINE HYCLATE 100 MG PO TABS
100.0000 mg | ORAL_TABLET | Freq: Once | ORAL | Status: AC
  Administered 2013-03-27: 100 mg via ORAL
  Filled 2013-03-27: qty 1

## 2013-03-27 MED ORDER — STERILE WATER FOR INJECTION IJ SOLN
INTRAMUSCULAR | Status: AC
  Administered 2013-03-27: 2.1 mL
  Filled 2013-03-27: qty 10

## 2013-03-27 MED ORDER — MINOCYCLINE HCL 100 MG PO CAPS: 100.0000 mg | ORAL_CAPSULE | Freq: Two times a day (BID) | ORAL | Status: DC

## 2013-03-27 MED ORDER — CEFTRIAXONE SODIUM 1 G IJ SOLR
1.0000 g | Freq: Once | INTRAMUSCULAR | Status: AC
  Administered 2013-03-27: 1 g via INTRAMUSCULAR
  Filled 2013-03-27: qty 10

## 2013-03-27 NOTE — ED Provider Notes (Signed)
CSN: [id number]     Arrival date & time 03/27/13  0708 History   First MD Initiated Contact with Patient 03/27/13 [phone]     Chief Complaint  Patient presents with  . Abscess    left     (Consider location/radiation/quality/duration/timing/severity/associated sxs/prior Treatment) Patient is a 53 y.o. male presenting with abscess. The history is provided by the patient.  Abscess Location:  Hand Hand abscess location:  L palm Size:  2cm Abscess quality: draining, painful and redness   Red streaking: no   Duration:  2 weeks Progression:  Worsening Pain details:    Quality:  Aching   Severity:  Moderate   Duration:  2 weeks   Timing:  Intermittent   Progression:  Worsening Chronicity:  Recurrent Context: diabetes   Relieved by:  Nothing Ineffective treatments: antibiotic creams. Associated symptoms: no fever, no nausea and no vomiting   Risk factors: hx of MRSA     Past Medical History  Diagnosis Date  . Anxiety   . Pulmonary nodule   . Hyperlipidemia   . COPD (chronic obstructive pulmonary disease)   . Chronic back pain   . Obesity   . Diabetes mellitus, type 2   . Hypertension   . Cellulitis 2011    of neck   . GERD (gastroesophageal reflux disease)   . Claustrophobia   . Diverticulitis of colon 0737    complicated by abscess  . Heart attack    Past Surgical History  Procedure Laterality Date  . Appendectomy  2007    gangrenous appendicitis   . Tonsillectomy    . Coronary artery bypass graft N/A 02/06/2013    Procedure: CORONARY ARTERY BYPASS GRAFTING (CABG) with TEE;  Surgeon: [name], MD;  Location: [place];  Service: Open Heart Surgery;  Laterality: N/A;  CABG x three,  using left internal mammary artery and right leg greater saphenous vein harvested endoscopically   Family History  Problem Relation Age of Onset  . Heart attack Father   . Diabetes Sister   . Coronary artery disease Sister   . Heart attack Brother   . Colon cancer Neg Hx   . Cancer  Mother     "blood transplant"  . Coronary artery disease Mother    History  Substance Use Topics  . Smoking status: Former Smoker -- 1.50 packs/day    Types: Cigarettes    Quit date: 01/13/1988  . Smokeless tobacco: Never Used  . Alcohol Use: No     Comment:      Review of Systems  Constitutional: Negative for fever and activity change.       All ROS Neg except as noted in HPI  HENT: Negative for nosebleeds.   Eyes: Negative for photophobia and discharge.  Respiratory: Negative for cough, shortness of breath and wheezing.   Cardiovascular: Negative for chest pain and palpitations.  Gastrointestinal: Negative for nausea, vomiting, abdominal pain and blood in stool.  Genitourinary: Negative for dysuria, frequency and hematuria.  Musculoskeletal: Positive for back pain. Negative for arthralgias and neck pain.  Skin: Positive for wound.  Neurological: Negative for dizziness, seizures and speech difficulty.  Psychiatric/Behavioral: Negative for hallucinations and confusion. The patient is nervous/anxious.       Allergies  Tylenol; Propoxyphene n-acetaminophen; and Lodine  Home Medications   Current Outpatient Rx  Name  Route  Sig  Dispense  Refill  . ALPRAZolam (XANAX) 0.25 MG tablet   Oral   Take 1 tablet (0.25 mg total) by  mouth 4 (four) times daily.   120 tablet   0   . amLODipine (NORVASC) 10 MG tablet   Oral   Take 10 mg by mouth daily.         Marland Kitchen amphetamine-dextroamphetamine (ADDERALL) 20 MG tablet   Oral   Take 60 mg by mouth daily.         Marland Kitchen aspirin EC 325 MG EC tablet   Oral   Take 1 tablet (325 mg total) by mouth daily.         Marland Kitchen atorvastatin (LIPITOR) 80 MG tablet   Oral   Take 1 tablet (80 mg total) by mouth daily at 6 PM.   30 tablet   1   . benazepril (LOTENSIN) 20 MG tablet   Oral   Take 20 mg by mouth daily.         . Canagliflozin (INVOKANA) 100 MG TABS   Oral   Take 100 mg by mouth daily.         . DULoxetine (CYMBALTA) 30  MG capsule   Oral   Take 30 mg by mouth daily.         Marland Kitchen esomeprazole (NEXIUM) 40 MG capsule   Oral   Take 40 mg by mouth daily at 12 noon.         . Fluticasone-Salmeterol (ADVAIR DISKUS) 500-50 MCG/DOSE AEPB   Inhalation   Inhale 1 puff into the lungs every 12 (twelve) hours.           . gabapentin (NEURONTIN) 300 MG capsule   Oral   Take 1 capsule (300 mg total) by mouth 3 (three) times daily.   90 capsule   3   . insulin aspart (NOVOLOG FLEXPEN) 100 UNIT/ML FlexPen   Subcutaneous   Inject 0-26 Units into the skin 3 (three) times daily with meals. 20 units three times a day with sliding scale max of 26 units         . metFORMIN (GLUCOPHAGE) 1000 MG tablet   Oral   Take 1,000 mg by mouth 2 (two) times daily with a meal.         . metoprolol (LOPRESSOR) 50 MG tablet   Oral   Take 1.5 tablets (75 mg total) by mouth 2 (two) times daily.   90 tablet   6   . oxyCODONE (OXY IR/ROXICODONE) 5 MG immediate release tablet   Oral   Take 1-2 tablets (5-10 mg total) by mouth every 6 (six) hours as needed for moderate pain.   50 tablet   0   . tiotropium (SPIRIVA) 18 MCG inhalation capsule   Inhalation   Place 18 mcg into inhaler and inhale daily.            BP 185/116  Pulse 95  Temp(Src) 97.7 F (36.5 C) (Oral)  Resp 20  Ht 5\' 9"  (1.753 m)  Wt 243 lb (110.224 kg)  BMI 35.87 kg/m2 Physical Exam  Musculoskeletal:       Hands:   ED Course  Procedures (including critical care time) Labs Review Labs Reviewed  BASIC METABOLIC PANEL - Abnormal; Notable for the following:    Glucose, Bld 301 (*)    All other components within normal limits  CBG MONITORING, ED - Abnormal; Notable for the following:    Glucose-Capillary 269 (*)    All other components within normal limits  CBC WITH DIFFERENTIAL   Imaging Review Dg Hand Complete Left  03/27/2013   CLINICAL DATA:  Abscessed  area to left anterior 5th MCP joint, no known injury  EXAM: LEFT HAND - COMPLETE 3+  VIEW  COMPARISON:  None.  FINDINGS: No fracture or dislocation is seen.  The joint spaces are preserved.  Mild dorsal soft tissue swelling overlying the metacarpals on the lateral view.  No radiographic findings to suggest osteomyelitis.  IMPRESSION: No acute osseous abnormality is seen.   Electronically Signed   By: Julian Hy M.D.   On: 03/27/2013 08:06     EKG Interpretation None      MDM Patient presents to the emergency department with what he believes to be an abscess of his left hand on the palmar surface. There is moderate swelling at the fifth MP area. There is mild swelling in the web space between the fourth and fifth fingers. Is no ridge treat appreciated. The patient states that the wound area drains a yellowish looking substance, this is followed by a bloody mucousy stuff substance. This occurred while the patient was in x-ray, but no active draining well in the emergency department.  Question if the patient has an infected wart, versus an abscess. There is no air or crepitus appreciated on examination. The x-ray of the left hand reveals mild dorsal soft tissue swelling, but no suggestion of osteomyelitis.  The plan at this time is for the patient to receive Rocephin 1 g in the emergency department. He will also receive doxycycline, since he has a history of methicillin-resistant staph arias. A prescription for doxycycline  Will also be given. Patient is advised to follow up with his primary physician in 2-3 days.    Final diagnoses:  None    *I have reviewed nursing notes, vital signs, and all appropriate lab and imaging results for this patient.Lenox Ahr, PA-C 03/27/13 [phone]

## 2013-03-27 NOTE — ED Notes (Signed)
Pt has 2cm abscess abover 5th digit on lt hand, started in December, healed and reopened 2 weeks ago. Pt has HX of MRSA. Pt had open heart surgery in January.

## 2013-03-27 NOTE — Discharge Instructions (Signed)
Please cleanse the wound to your hand with soap and water. Apply a Neosporin dressing daily until the wound heals. Use minocycline daily until all taken. Please see Dr. Bridget Hartshorn or a member of his team in 2-3 days for recheck of your hand. The x-ray of your hand is negative for any fracture, dislocation, or evidence of infection of the bone.

## 2013-03-27 NOTE — ED Provider Notes (Signed)
Medical screening examination/treatment/procedure(s) were performed by non-physician practitioner and as supervising physician I was immediately available for consultation/collaboration.   EKG Interpretation None        Sharyon Cable, MD 03/27/13 [phone]

## 2013-03-27 NOTE — ED Notes (Signed)
Pt also has HX of DMII, pt has not checked sugar in 4 days.

## 2013-03-29 ENCOUNTER — Telehealth: Payer: Self-pay | Admitting: Cardiovascular Disease

## 2013-03-29 NOTE — Telephone Encounter (Signed)
Manuel Knight called office today stating that he called his heart surgeon requesting refill on his OXYCODONE HCL 5 MG PO TABS Medication ° °Was told that he would need to follow up with Dr. Koneswaran and we would take care of his refills.  °

## 2013-03-29 NOTE — Telephone Encounter (Signed)
Manuel Knight called office today stating that he called his heart surgeon requesting refill on his OXYCODONE HCL 5 MG PO TABS Medication  Was told that he would need to follow up with Dr. Bronson Ing and we would take care of his refills.

## 2013-03-30 ENCOUNTER — Encounter (HOSPITAL_COMMUNITY): Payer: Medicare Other

## 2013-03-30 ENCOUNTER — Other Ambulatory Visit: Payer: Self-pay | Admitting: *Deleted

## 2013-03-30 DIAGNOSIS — G8918 Other acute postprocedural pain: Secondary | ICD-10-CM

## 2013-03-30 MED ORDER — OXYCODONE HCL 5 MG PO TABS: 5.0000 mg | ORAL_TABLET | Freq: Four times a day (QID) | ORAL | Status: DC | PRN

## 2013-03-30 NOTE — Telephone Encounter (Addendum)
Patient notified.  Advised that we do not prescribe these types of medications in this office.  CABG 02/06/2013.  Advised him to check with PMD as he would need to manage this long term for him.  Stated that Dr. Bridget Hartshorn had retired.  (previously discharged from Dr. Moshe Cipro office due to non compliance)  Advised him that he may try to establish with health department or a new primary care physician.  Patient verbalized understanding.

## 2013-04-05 ENCOUNTER — Emergency Department (HOSPITAL_COMMUNITY)
Admission: EM | Admit: 2013-04-05 | Discharge: 2013-04-05 | Disposition: A | Discharge status: Home / Self Care | Payer: Medicare Other | Attending: Emergency Medicine | Admitting: Emergency Medicine

## 2013-04-05 ENCOUNTER — Emergency Department (HOSPITAL_COMMUNITY): Payer: Medicare Other

## 2013-04-05 ENCOUNTER — Encounter (HOSPITAL_COMMUNITY): Payer: Self-pay | Admitting: Emergency Medicine

## 2013-04-05 DIAGNOSIS — B078 Other viral warts: Secondary | ICD-10-CM

## 2013-04-05 DIAGNOSIS — F411 Generalized anxiety disorder: Secondary | ICD-10-CM | POA: Insufficient documentation

## 2013-04-05 DIAGNOSIS — E785 Hyperlipidemia, unspecified: Secondary | ICD-10-CM | POA: Insufficient documentation

## 2013-04-05 DIAGNOSIS — I10 Essential (primary) hypertension: Secondary | ICD-10-CM | POA: Insufficient documentation

## 2013-04-05 DIAGNOSIS — Z87891 Personal history of nicotine dependence: Secondary | ICD-10-CM | POA: Insufficient documentation

## 2013-04-05 DIAGNOSIS — Z951 Presence of aortocoronary bypass graft: Secondary | ICD-10-CM | POA: Insufficient documentation

## 2013-04-05 DIAGNOSIS — G8929 Other chronic pain: Secondary | ICD-10-CM | POA: Insufficient documentation

## 2013-04-05 DIAGNOSIS — Z794 Long term (current) use of insulin: Secondary | ICD-10-CM | POA: Insufficient documentation

## 2013-04-05 DIAGNOSIS — J4489 Other specified chronic obstructive pulmonary disease: Secondary | ICD-10-CM | POA: Insufficient documentation

## 2013-04-05 DIAGNOSIS — E119 Type 2 diabetes mellitus without complications: Secondary | ICD-10-CM | POA: Insufficient documentation

## 2013-04-05 DIAGNOSIS — I252 Old myocardial infarction: Secondary | ICD-10-CM | POA: Insufficient documentation

## 2013-04-05 DIAGNOSIS — B079 Viral wart, unspecified: Secondary | ICD-10-CM | POA: Insufficient documentation

## 2013-04-05 DIAGNOSIS — E669 Obesity, unspecified: Secondary | ICD-10-CM | POA: Insufficient documentation

## 2013-04-05 DIAGNOSIS — IMO0002 Reserved for concepts with insufficient information to code with codable children: Secondary | ICD-10-CM | POA: Insufficient documentation

## 2013-04-05 DIAGNOSIS — Z79899 Other long term (current) drug therapy: Secondary | ICD-10-CM | POA: Insufficient documentation

## 2013-04-05 DIAGNOSIS — Z7982 Long term (current) use of aspirin: Secondary | ICD-10-CM | POA: Insufficient documentation

## 2013-04-05 DIAGNOSIS — J449 Chronic obstructive pulmonary disease, unspecified: Secondary | ICD-10-CM | POA: Insufficient documentation

## 2013-04-05 DIAGNOSIS — K219 Gastro-esophageal reflux disease without esophagitis: Secondary | ICD-10-CM | POA: Insufficient documentation

## 2013-04-05 MED ORDER — SILVER NITRATE-POT NITRATE 75-25 % EX MISC
CUTANEOUS | Status: AC
  Administered 2013-04-05: 14:00:00
  Filled 2013-04-05: qty 1

## 2013-04-05 MED ORDER — LIDOCAINE HCL (PF) 1 % IJ SOLN
INTRAMUSCULAR | Status: AC
  Filled 2013-04-05: qty 5

## 2013-04-05 NOTE — ED Notes (Signed)
Patient given discharge instruction, verbalized understand. Patient ambulatory out of the department.  

## 2013-04-05 NOTE — ED Provider Notes (Signed)
CSN: [id number]     Arrival date & time 04/05/13  1155 History   First MD Initiated Contact with Patient 04/05/13 1214     Chief Complaint  Patient presents with  . Abscess     (Consider location/radiation/quality/duration/timing/severity/associated sxs/prior Treatment) The history is provided by the patient. No language interpreter was used.    Past Medical History  Diagnosis Date  . Anxiety   . Pulmonary nodule   . Hyperlipidemia   . COPD (chronic obstructive pulmonary disease)   . Chronic back pain   . Obesity   . Diabetes mellitus, type 2   . Hypertension   . Cellulitis 2011    of neck   . GERD (gastroesophageal reflux disease)   . Claustrophobia   . Diverticulitis of colon 1448    complicated by abscess  . Heart attack    Past Surgical History  Procedure Laterality Date  . Appendectomy  2007    gangrenous appendicitis   . Tonsillectomy    . Coronary artery bypass graft N/A 02/06/2013    Procedure: CORONARY ARTERY BYPASS GRAFTING (CABG) with TEE;  Surgeon: [name], MD;  Location: [place];  Service: Open Heart Surgery;  Laterality: N/A;  CABG x three,  using left internal mammary artery and right leg greater saphenous vein harvested endoscopically   Family History  Problem Relation Age of Onset  . Heart attack Father   . Diabetes Sister   . Coronary artery disease Sister   . Heart attack Brother   . Colon cancer Neg Hx   . Cancer Mother     "blood transplant"  . Coronary artery disease Mother    History  Substance Use Topics  . Smoking status: Former Smoker -- 1.50 packs/day    Types: Cigarettes    Quit date: 01/13/1988  . Smokeless tobacco: Never Used  . Alcohol Use: No     Comment:      Review of Systems  Constitutional: Negative for fever and chills.  Musculoskeletal: Negative for arthralgias, back pain, gait problem and myalgias.  All other systems reviewed and are negative.      Allergies  Tylenol; Propoxyphene n-acetaminophen; and  Lodine  Home Medications   Current Outpatient Rx  Name  Route  Sig  Dispense  Refill  . ALPRAZolam (XANAX) 0.25 MG tablet   Oral   Take 1 tablet (0.25 mg total) by mouth 4 (four) times daily.   120 tablet   0   . amLODipine (NORVASC) 10 MG tablet   Oral   Take 10 mg by mouth daily.         Marland Kitchen amphetamine-dextroamphetamine (ADDERALL) 20 MG tablet   Oral   Take 60 mg by mouth daily.         Marland Kitchen aspirin EC 325 MG EC tablet   Oral   Take 1 tablet (325 mg total) by mouth daily.         Marland Kitchen atorvastatin (LIPITOR) 80 MG tablet   Oral   Take 1 tablet (80 mg total) by mouth daily at 6 PM.   30 tablet   1   . benazepril (LOTENSIN) 20 MG tablet   Oral   Take 20 mg by mouth daily.         . Canagliflozin (INVOKANA) 100 MG TABS   Oral   Take 100 mg by mouth daily.         . DULoxetine (CYMBALTA) 30 MG capsule   Oral   Take 30 mg  by mouth daily.         . Fluticasone-Salmeterol (ADVAIR DISKUS) 500-50 MCG/DOSE AEPB   Inhalation   Inhale 1 puff into the lungs every 12 (twelve) hours.           . gabapentin (NEURONTIN) 300 MG capsule   Oral   Take 1 capsule (300 mg total) by mouth 3 (three) times daily.   90 capsule   3   . insulin aspart (NOVOLOG FLEXPEN) 100 UNIT/ML FlexPen   Subcutaneous   Inject 0-26 Units into the skin 3 (three) times daily with meals. 20 units three times a day with sliding scale max of 26 units         . metFORMIN (GLUCOPHAGE) 1000 MG tablet   Oral   Take 1,000 mg by mouth 2 (two) times daily with a meal.         . metoprolol (LOPRESSOR) 50 MG tablet   Oral   Take 1.5 tablets (75 mg total) by mouth 2 (two) times daily.   90 tablet   6   . oxyCODONE (OXY IR/ROXICODONE) 5 MG immediate release tablet   Oral   Take 1-2 tablets (5-10 mg total) by mouth every 6 (six) hours as needed for moderate pain.   40 tablet   0   . pantoprazole (PROTONIX) 40 MG tablet   Oral   Take 1 tablet by mouth daily.         Marland Kitchen tiotropium (SPIRIVA)  18 MCG inhalation capsule   Inhalation   Place 18 mcg into inhaler and inhale daily.            BP 190/100  Pulse 120  Temp(Src) 97.7 F (36.5 C) (Oral)  Resp 20  Ht 5\' 9"  (1.753 m)  Wt 243 lb (110.224 kg)  BMI 35.87 kg/m2  SpO2 100% Physical Exam  Nursing note and vitals reviewed. Constitutional: He is oriented to person, place, and time. He appears well-developed and well-nourished.  Well-appearing  HENT:  Head: Normocephalic and atraumatic.  Eyes: Conjunctivae and EOM are normal.  Neck: Normal range of motion. Neck supple. No JVD present. No tracheal deviation present. No thyromegaly present.  Cardiovascular: Normal rate, regular rhythm and normal heart sounds.   Pulmonary/Chest: Effort normal and breath sounds normal. No respiratory distress. He has no wheezes.  Musculoskeletal: Normal range of motion.  Neurological: He is alert and oriented to person, place, and time.  Skin: Skin is warm and dry.  Left palm, 1cm vesicular skin lesion.  Psychiatric: He has a normal mood and affect. His behavior is normal. Judgment and thought content normal.    ED Course  Procedures (including critical care time) Labs Review Labs Reviewed - No data to display Imaging Review Dg Hand Complete Left  04/05/2013   CLINICAL DATA:  Abscess in the region of the fifth metacarpal  EXAM: LEFT HAND - COMPLETE 3+ VIEW  COMPARISON:  DG HAND COMPLETE*L* dated 03/27/2013  FINDINGS: There is no evidence of fracture or dislocation. There is no evidence of arthropathy or other focal bone abnormality. Soft tissues are unremarkable.  IMPRESSION: Negative.   Electronically Signed   By: Conchita Paris M.D.   On: 04/05/2013 13:31     EKG Interpretation None      MDM   Final diagnoses:  Common wart   Pt had vesicular, blister type lesion. Underneath this lesion was a wart, approx 0.5-1 cm in size. Discussed pan of care with pt. May try OTC wart preparations in a  few days. Follow-up with PCP. No I & D  here. Return precautions given.       Elisha Headland, NP 04/09/13 1409

## 2013-04-05 NOTE — ED Notes (Signed)
Pt reports has abscess to left palm of hand.  Reports was seen here last week and started on minocycline.

## 2013-04-05 NOTE — Discharge Instructions (Signed)
Warts Warts are a common viral infection. They are most commonly caused by the human papillomavirus (HPV). Warts can occur at all ages. However, they occur most frequently in older children and infrequently in the elderly. Warts may be single or multiple. Location and size varies. Warts can be spread by scratching the wart and then scratching normal skin. The life cycle of warts varies. However, most will disappear over many months to a couple years. Warts commonly do not cause problems (asymptomatic) unless they are over an area of pressure, such as the bottom of the foot. If they are large enough, they may cause pain with walking. DIAGNOSIS  Warts are most commonly diagnosed by their appearance. Tissue samples (biopsies) are not required unless the wart looks abnormal. Most warts have a rough surface, are round, oval, or irregular, and are skin-colored to light yellow, brown, or gray. They are generally less than  inch (1.3 cm), but they can be any size. TREATMENT   Observation or no treatment.  Freezing with liquid nitrogen.  High heat (cautery).  Boosting the body's immunity to fight off the wart (immunotherapy using Candida antigen).  Laser surgery.  Application of various irritants and solutions. HOME CARE INSTRUCTIONS  Follow your caregiver's instructions. No special precautions are necessary. Often, treatment may be followed by a return (recurrence) of warts. Warts are generally difficult to treat and get rid of. If treatment is done in a clinic setting, usually more than 1 treatment is required. This is usually done on only a monthly basis until the wart is completely gone. SEEK IMMEDIATE MEDICAL CARE IF: The treated skin becomes red, puffy (swollen), or painful. Document Released: 10/08/2004 Document Revised: 04/25/2012 Document Reviewed: 04/05/2009 [facility] Patient Information 2014 [place].   Over the counter wart treatments, salicylic acid, in one week Follow-up with  your regular doctor

## 2013-04-12 NOTE — ED Provider Notes (Signed)
Medical screening examination/treatment/procedure(s) were performed by non-physician practitioner and as supervising physician I was immediately available for consultation/collaboration.   EKG Interpretation None        Maudry Diego, MD 04/12/13 [phone]

## 2013-04-13 ENCOUNTER — Other Ambulatory Visit: Payer: Self-pay | Admitting: Cardiovascular Disease

## 2013-04-13 ENCOUNTER — Other Ambulatory Visit: Payer: Self-pay | Admitting: Surgical

## 2013-04-18 ENCOUNTER — Encounter (HOSPITAL_COMMUNITY): Payer: Medicare Other

## 2013-04-19 ENCOUNTER — Telehealth: Payer: Self-pay | Admitting: *Deleted

## 2013-04-19 NOTE — Telephone Encounter (Signed)
States he saw his PMD on Monday for a pain in his chest.  Stated it feels like a needle jabbing him off/on. Does not happen regularly .  Stated his PMD thought is was muscular related.  Patient does not c/o shortness of breath or any other symptoms.  Stated that he did mow with self propelled push mower the other day & was able to do a lot of walking without any difficulty.  Advised that he could discuss this further at his next visit with Dr. Bronson Ing.  2 month follow up scheduled for 04/27/2013.  Patient verbalized understanding.

## 2013-04-19 NOTE — Telephone Encounter (Signed)
Message left on voice mail - had a question to ask.  Attempted to return call - left message.

## 2013-04-24 DIAGNOSIS — Z951 Presence of aortocoronary bypass graft: Secondary | ICD-10-CM | POA: Insufficient documentation

## 2013-04-27 ENCOUNTER — Encounter (HOSPITAL_COMMUNITY)
Admission: RE | Admit: 2013-04-27 | Discharge: 2013-04-27 | Disposition: A | Discharge status: Home / Self Care | Payer: Medicare Other | Source: Ambulatory Visit | Attending: Cardiovascular Disease | Admitting: Cardiovascular Disease

## 2013-04-27 ENCOUNTER — Ambulatory Visit: Payer: Medicare Other | Admitting: Cardiovascular Disease

## 2013-05-01 ENCOUNTER — Ambulatory Visit: Payer: Medicare Other | Admitting: Cardiovascular Disease

## 2013-05-02 ENCOUNTER — Ambulatory Visit: Payer: Medicare Other | Admitting: Cardiovascular Disease

## 2013-06-20 DIAGNOSIS — Z0271 Encounter for disability determination: Secondary | ICD-10-CM

## 2013-10-11 ENCOUNTER — Other Ambulatory Visit: Payer: Self-pay | Admitting: *Deleted

## 2013-10-11 MED ORDER — METOPROLOL TARTRATE 50 MG PO TABS: 75.0000 mg | ORAL_TABLET | Freq: Two times a day (BID) | ORAL | Status: DC

## 2013-11-07 ENCOUNTER — Encounter: Payer: Self-pay | Admitting: Cardiovascular Disease

## 2013-11-07 ENCOUNTER — Ambulatory Visit (INDEPENDENT_AMBULATORY_CARE_PROVIDER_SITE_OTHER): Payer: Medicare Other | Admitting: Cardiovascular Disease

## 2013-11-07 VITALS — BP 136/82 | HR 85 | Ht 69.0 in | Wt 249.0 lb

## 2013-11-07 DIAGNOSIS — I10 Essential (primary) hypertension: Secondary | ICD-10-CM

## 2013-11-07 DIAGNOSIS — I2510 Atherosclerotic heart disease of native coronary artery without angina pectoris: Secondary | ICD-10-CM

## 2013-11-07 DIAGNOSIS — R0609 Other forms of dyspnea: Secondary | ICD-10-CM

## 2013-11-07 DIAGNOSIS — J438 Other emphysema: Secondary | ICD-10-CM

## 2013-11-07 DIAGNOSIS — I25810 Atherosclerosis of coronary artery bypass graft(s) without angina pectoris: Secondary | ICD-10-CM

## 2013-11-07 DIAGNOSIS — I429 Cardiomyopathy, unspecified: Secondary | ICD-10-CM

## 2013-11-07 DIAGNOSIS — I5022 Chronic systolic (congestive) heart failure: Secondary | ICD-10-CM

## 2013-11-07 DIAGNOSIS — R0600 Dyspnea, unspecified: Secondary | ICD-10-CM

## 2013-11-07 DIAGNOSIS — E785 Hyperlipidemia, unspecified: Secondary | ICD-10-CM

## 2013-11-07 MED ORDER — CARVEDILOL 12.5 MG PO TABS: 12.5000 mg | ORAL_TABLET | Freq: Two times a day (BID) | ORAL | Status: DC

## 2013-11-07 MED ORDER — ASPIRIN EC 81 MG PO TBEC: 81.0000 mg | DELAYED_RELEASE_TABLET | Freq: Every day | ORAL | Status: AC

## 2013-11-07 NOTE — Patient Instructions (Signed)
Your physician has requested that you have an echocardiogram. Echocardiography is a painless test that uses sound waves to create images of your heart. It provides your doctor with information about the size and shape of your heart and how well your heart's chambers and valves are working. This procedure takes approximately one hour. There are no restrictions for this procedure. Office will contact with results via phone or letter.   Decrease Aspirin to 81mg  daily Stop Metoprolol Change to Coreg 12.5mg  twice a day  - new sent to pharm Continue all other medications.   Please have copy of PFT results sent to office. Your physician wants you to follow up in: 6 months.  You will receive a reminder letter in the mail one-two months in advance.  If you don't receive a letter, please call our office to schedule the follow up appointment

## 2013-11-07 NOTE — Progress Notes (Signed)
Patient ID: Manuel Knight, male   DOB: [birth year], 53 y.o.   MRN: [id number]      SUBJECTIVE: The patient is a 53 yr old male with diabetes, hyperlipidemia, hypertension, COPD, and CAD. He sustained a non-ST elevation myocardial infarction and ultimately underwent 3-vessel coronary artery bypass graft surgery on 02/06/13, with a LIMA to the LAD, SVG to OM1, and SVG to RCA. Left ventriculography revealed an EF of 30% and LVEDP was 20 mmHg. He very seldom has chest pain. He has been more dyspneic with exertion. He does not smoke. He does have COPD. He is being scheduled to undergo pulmonary function testing in the near future. He denies leg swelling. He has woken up with shortness of breath.    Review of Systems: As per "subjective", otherwise negative.  Allergies  Allergen Reactions  . Tylenol [Acetaminophen] Hives  . Propoxyphene N-Acetaminophen Other (See Comments)    unknown  . Lodine [Etodolac] Swelling and Rash    Current Outpatient Prescriptions  Medication Sig Dispense Refill  . alprazolam (XANAX) 2 MG tablet Take 1 tablet by mouth 4 (four) times daily.      Marland Kitchen amLODipine (NORVASC) 10 MG tablet Take 10 mg by mouth daily.      Marland Kitchen amphetamine-dextroamphetamine (ADDERALL) 20 MG tablet Take 60 mg by mouth daily.      Marland Kitchen aspirin EC 325 MG EC tablet Take 1 tablet (325 mg total) by mouth daily.      . benazepril (LOTENSIN) 20 MG tablet Take 20 mg by mouth daily.      . Canagliflozin (INVOKANA) 100 MG TABS Take 100 mg by mouth daily.      . DULoxetine (CYMBALTA) 30 MG capsule Take 30 mg by mouth daily.      . Fluticasone-Salmeterol (ADVAIR DISKUS) 500-50 MCG/DOSE AEPB Inhale 1 puff into the lungs every 12 (twelve) hours.        . gabapentin (NEURONTIN) 300 MG capsule Take 1 capsule (300 mg total) by mouth 3 (three) times daily.  90 capsule  3  . insulin aspart (NOVOLOG FLEXPEN) 100 UNIT/ML FlexPen Inject 0-26 Units into the skin 3 (three) times daily with meals. 20 units three times a day  with sliding scale max of 26 units      . metFORMIN (GLUCOPHAGE) 1000 MG tablet Take 1,000 mg by mouth 2 (two) times daily with a meal.      . metoprolol (LOPRESSOR) 50 MG tablet Take 1.5 tablets (75 mg total) by mouth 2 (two) times daily.  90 tablet  0  . Oxycodone HCl 10 MG TABS Take 1 tablet by mouth 2 (two) times daily.      . pantoprazole (PROTONIX) 40 MG tablet Take 1 tablet by mouth daily.      Marland Kitchen PROAIR HFA 108 (90 BASE) MCG/ACT inhaler Inhale 2 puffs into the lungs every 4 (four) hours as needed.      . tiotropium (SPIRIVA) 18 MCG inhalation capsule Place 18 mcg into inhaler and inhale daily.         No current facility-administered medications for this visit.    Past Medical History  Diagnosis Date  . Anxiety   . Pulmonary nodule   . Hyperlipidemia   . COPD (chronic obstructive pulmonary disease)   . Chronic back pain   . Obesity   . Diabetes mellitus, type 2   . Hypertension   . Cellulitis 2011    of neck   . GERD (gastroesophageal reflux disease)   . Claustrophobia   .  Diverticulitis of colon 6073    complicated by abscess  . Heart attack     Past Surgical History  Procedure Laterality Date  . Appendectomy  2007    gangrenous appendicitis   . Tonsillectomy    . Coronary artery bypass graft N/A 02/06/2013    Procedure: CORONARY ARTERY BYPASS GRAFTING (CABG) with TEE;  Surgeon: [name], MD;  Location: [place];  Service: Open Heart Surgery;  Laterality: N/A;  CABG x three,  using left internal mammary artery and right leg greater saphenous vein harvested endoscopically    History   Social History  . Marital Status: Single    Spouse Name: N/A    Number of Children: 2tw  . Years of Education: N/A   Occupational History  . Disabled    Social History Main Topics  . Smoking status: Former Smoker -- 1.50 packs/day for 19 years    Types: Cigarettes    Start date: 03/12/1967    Quit date: 01/13/1988  . Smokeless tobacco: Never Used  . Alcohol Use: No      Comment:    . Drug Use: No     Comment: Formerly used marijuana for the pain, denies now.  . Sexual Activity: Not on file   Other Topics Concern  . Not on file   Social History Narrative   Lives with significant other of 18 years. They are raising his grandson.     Filed Vitals:   11/07/13 1303  Height: 5\' 9"  (1.753 m)  Weight: 249 lb (112.946 kg)   BP 136/82 Pulse 85   PHYSICAL EXAM General: NAD HEENT: Normal. Neck: No JVD, no thyromegaly. Lungs: Prolonged expiratory phase, no rales or wheezes. CV: Nondisplaced PMI.  Regular rate and rhythm, normal S1/S2, no S3/S4, no murmur. No pretibial or periankle edema.  No carotid bruit.  Abdomen: Soft, obese, no distention.  Neurologic: Alert and oriented x 3.  Psych: Normal affect. Skin: Normal. Musculoskeletal: Normal range of motion, no gross deformities. Extremities: No clubbing or cyanosis.   ECG: Most recent ECG reviewed.      ASSESSMENT AND PLAN: 1. CAD s/p 3-vessel CABG: Continue ASA (reduce to 81 mg) and high-dose atorvastatin. Will switch metoprolol to Coreg 12.5 mg bid. Will need echo to reassess LV systolic function.   2. Ischemic cardiomyopathy, EF 30%: No evidence of heart failure. Will need to reassess LV function with echocardiogram. Will switch metoprolol to Coreg given increasing DOE with h/o COPD. If it remains severely reduced, I would consider addition of Entresto and refer for ICD. 3. Essential HTN: Well controlled on present therapy.  4. Hyperlipidemia: Continue high dose Lipitor 80 mg daily.  5. DOE/COPD: Being scheduled for PFT's. Does have COPD. Obtain echo to assess LV function. Encouraged Spiriva and Advair use.  Dispo: f/u 6 months.  Kate Sable, M.D., F.A.C.C.

## 2013-11-15 ENCOUNTER — Other Ambulatory Visit: Payer: Self-pay

## 2013-11-15 ENCOUNTER — Other Ambulatory Visit (INDEPENDENT_AMBULATORY_CARE_PROVIDER_SITE_OTHER): Payer: Medicare Other

## 2013-11-15 DIAGNOSIS — I429 Cardiomyopathy, unspecified: Secondary | ICD-10-CM

## 2013-11-15 DIAGNOSIS — I25810 Atherosclerosis of coronary artery bypass graft(s) without angina pectoris: Secondary | ICD-10-CM

## 2013-11-15 DIAGNOSIS — R0609 Other forms of dyspnea: Secondary | ICD-10-CM

## 2013-11-16 ENCOUNTER — Telehealth: Payer: Self-pay | Admitting: *Deleted

## 2013-11-16 DIAGNOSIS — R931 Abnormal findings on diagnostic imaging of heart and coronary circulation: Secondary | ICD-10-CM

## 2013-11-16 MED ORDER — SACUBITRIL-VALSARTAN 49-51 MG PO TABS: 1.0000 | ORAL_TABLET | Freq: Two times a day (BID) | ORAL | Status: DC

## 2013-11-16 NOTE — Telephone Encounter (Signed)
Notes Recorded by Laurine Blazer, LPN on 19/07/5881 at 25:49 PM Patient notified. Will put referral in & forward to [facility]). Patient will come by office today to pick up samples & new rx will be sent to [place].

## 2013-11-16 NOTE — Telephone Encounter (Signed)
-----   Message from Herminio Commons, MD sent at 11/15/2013  2:25 PM EST ----- LV function remains severely reduced. D/c benazepril for 48 hours, then start Entresto 49/51 mg bid. Please provide 2 weeks of samples. Please make referral for ICD to EP.

## 2013-11-17 ENCOUNTER — Telehealth: Payer: Self-pay | Admitting: Cardiovascular Disease

## 2013-11-17 NOTE — Telephone Encounter (Signed)
Patient called. Manuel Knight that he was returning a call?

## 2013-11-17 NOTE — Telephone Encounter (Signed)
Girlfriend came to pick up medication Arnell Asal) today.

## 2013-12-06 ENCOUNTER — Encounter: Payer: Self-pay | Admitting: Internal Medicine

## 2013-12-06 ENCOUNTER — Ambulatory Visit (INDEPENDENT_AMBULATORY_CARE_PROVIDER_SITE_OTHER): Payer: Medicare Other | Admitting: Internal Medicine

## 2013-12-06 DIAGNOSIS — J449 Chronic obstructive pulmonary disease, unspecified: Secondary | ICD-10-CM

## 2013-12-06 DIAGNOSIS — I5022 Chronic systolic (congestive) heart failure: Secondary | ICD-10-CM

## 2013-12-06 NOTE — Patient Instructions (Addendum)
Your physician recommends that you schedule a follow-up appointment in: 3 months with Dr. Lovena Le in the [place] office.  Your physician recommends that you continue on your current medications as directed. Please refer to the Current Medication list given to you today.  You have been referred to Dr. Lamonte Sakai in pulmonary (COPD/ cough)

## 2013-12-06 NOTE — Progress Notes (Signed)
HPI [name] is referred today by Dr. Jacinta Shoe for consideration for prophylactic ICD implantation. He is a very pleasant 53 year old man who sustained a non-ST elevation MI approximately 12 months ago. He underwent bypass grafting after he was found to have severe three-vessel coronary disease. Postoperatively, his ejection fraction was 30%. On repeat evaluation one month ago, his ejection fraction was 35%. On benazepril, the patient developed a severe cough which was discontinued approximately 2 weeks ago. He was placed on Entresto. Since then, he is much improved. His heart failure symptoms have gone from class III to class II. His cough is also much better though not resolved completely. No syncope. No anginal symptoms. Allergies  Allergen Reactions  . Tylenol [Acetaminophen] Hives  . Lodine [Etodolac] Swelling and Rash    Rash and swelling   . Propoxyphene N-Acetaminophen Other (See Comments)    unknown     Current Outpatient Prescriptions  Medication Sig Dispense Refill  . alprazolam (XANAX) 2 MG tablet Take 1 tablet by mouth 4 (four) times daily.    Manuel Knight amLODipine (NORVASC) 10 MG tablet Take 10 mg by mouth daily.    Manuel Knight amphetamine-dextroamphetamine (ADDERALL) 20 MG tablet Take 60 mg by mouth daily.    Manuel Knight aspirin EC 81 MG tablet Take 1 tablet (81 mg total) by mouth daily.    Manuel Knight atorvastatin (LIPITOR) 80 MG tablet Take 1 tablet by mouth daily.    . Canagliflozin (INVOKANA) 100 MG TABS Take 100 mg by mouth daily.    . carvedilol (COREG) 12.5 MG tablet Take 1 tablet (12.5 mg total) by mouth 2 (two) times daily. 60 tablet 6  . citalopram (CELEXA) 20 MG tablet Take 1 tablet by mouth daily.    . cloNIDine (CATAPRES) 0.3 MG tablet Take 1 tablet by mouth 2 (two) times daily.    . Fluticasone-Salmeterol (ADVAIR DISKUS) 500-50 MCG/DOSE AEPB Inhale 1 puff into the lungs every 12 (twelve) hours.      . insulin aspart (NOVOLOG FLEXPEN) 100 UNIT/ML FlexPen Inject 0-26 Units into the skin 3  (three) times daily with meals. 20 units three times a day with sliding scale max of 26 units    . meloxicam (MOBIC) 15 MG tablet Take 1 tablet by mouth daily.    . metFORMIN (GLUCOPHAGE) 1000 MG tablet Take 1,000 mg by mouth 2 (two) times daily with a meal.    . Oxycodone HCl 10 MG TABS Take 1 tablet by mouth 2 (two) times daily.    . pantoprazole (PROTONIX) 40 MG tablet Take 1 tablet by mouth daily.    Manuel Knight PROAIR HFA 108 (90 BASE) MCG/ACT inhaler Inhale 2 puffs into the lungs every 4 (four) hours as needed.    . Sacubitril-Valsartan (ENTRESTO) 49-51 MG TABS Take 1 tablet by mouth 2 (two) times daily. 60 tablet 6  . tiotropium (SPIRIVA) 18 MCG inhalation capsule Place 18 mcg into inhaler and inhale daily.       No current facility-administered medications for this visit.     Past Medical History  Diagnosis Date  . Anxiety   . Pulmonary nodule   . Hyperlipidemia   . COPD (chronic obstructive pulmonary disease)   . Chronic back pain   . Obesity   . Diabetes mellitus, type 2   . Hypertension   . Cellulitis 2011    of neck   . GERD (gastroesophageal reflux disease)   . Claustrophobia   . Diverticulitis of colon 2694    complicated by  abscess  . Heart attack     ROS:   All systems reviewed and negative except as noted in the HPI.   Past Surgical History  Procedure Laterality Date  . Appendectomy  2007    gangrenous appendicitis   . Tonsillectomy    . Coronary artery bypass graft N/A 02/06/2013    Procedure: CORONARY ARTERY BYPASS GRAFTING (CABG) with TEE;  Surgeon: [name], MD;  Location: [place];  Service: Open Heart Surgery;  Laterality: N/A;  CABG x three,  using left internal mammary artery and right leg greater saphenous vein harvested endoscopically     Family History  Problem Relation Age of Onset  . Heart attack Father   . Diabetes Sister   . Coronary artery disease Sister   . Heart attack Brother   . Colon cancer Neg Hx   . Cancer Mother     "blood  transplant"  . Coronary artery disease Mother      History   Social History  . Marital Status: Single    Spouse Name: N/A    Number of Children: 2tw  . Years of Education: N/A   Occupational History  . Disabled    Social History Main Topics  . Smoking status: Former Smoker -- 1.50 packs/day for 19 years    Types: Cigarettes    Start date: 03/12/1967    Quit date: 01/13/1988  . Smokeless tobacco: Never Used  . Alcohol Use: No     Comment:    . Drug Use: No     Comment: Formerly used marijuana for the pain, denies now.  . Sexual Activity: Not on file   Other Topics Concern  . Not on file   Social History Narrative   Lives with significant other of 18 years. They are raising his grandson.     BP 110/70 mmHg  Pulse 83  Ht 5\' 9"  (1.753 m)  Wt 254 lb 9.6 oz (115.486 kg)  BMI 37.58 kg/m2  Physical Exam:  Well appearing 53 yo man, NAD HEENT: Unremarkable Neck:  No JVD, no thyromegally Lymphatics:  No adenopathy Back:  No CVA tenderness Lungs:  Clear with no wheezes HEART:  Regular rate rhythm, no murmurs, no rubs, no clicks Abd:  soft, positive bowel sounds, no organomegally, no rebound, no guarding Ext:  2 plus pulses, no edema, no cyanosis, no clubbing Skin:  No rashes no nodules Neuro:  CN II through XII intact, motor grossly intact  EKG - nsr with inferior and anterior MI   Assess/Plan:

## 2013-12-06 NOTE — Assessment & Plan Note (Signed)
His denies anginal symptoms. He will continue his current symptoms.

## 2013-12-06 NOTE — Assessment & Plan Note (Signed)
He was supposed to see a doctor in [place] but prefers to see one of our [facility] lung specialists. Will refer to Dr. Lamonte Sakai and associates.

## 2013-12-06 NOTE — Assessment & Plan Note (Signed)
His blood pressure is well controlled. No change in meds. He will maintain a low sodium diet. 

## 2013-12-06 NOTE — Assessment & Plan Note (Signed)
His heart failure is class 2, improved with initiation of Entresto. He may continue to improve his LV function and I have recommended he wait to see if his EF improves further on Entresto. I will schedule a 2D echo in 3 months and see him back after in [place].

## 2013-12-21 ENCOUNTER — Encounter (HOSPITAL_COMMUNITY): Payer: Self-pay | Admitting: Interventional Cardiology

## 2014-01-19 ENCOUNTER — Institutional Professional Consult (permissible substitution): Payer: Medicare Other | Admitting: Emergency Medicine

## 2014-01-25 ENCOUNTER — Telehealth: Payer: Self-pay | Admitting: *Deleted

## 2014-01-25 NOTE — Telephone Encounter (Signed)
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  Feels like he is having problem now.  Advised patient to contact PMD as this is something that we can not treat here.

## 2014-02-13 ENCOUNTER — Ambulatory Visit (INDEPENDENT_AMBULATORY_CARE_PROVIDER_SITE_OTHER): Payer: Medicare Other | Admitting: Pulmonary Disease

## 2014-02-13 ENCOUNTER — Encounter: Payer: Self-pay | Admitting: Pulmonary Disease

## 2014-02-13 VITALS — BP 120/68 | HR 80 | Temp 97.6°F | Ht 69.5 in | Wt 270.2 lb

## 2014-02-13 DIAGNOSIS — R0600 Dyspnea, unspecified: Secondary | ICD-10-CM

## 2014-02-13 DIAGNOSIS — K2100 Gastro-esophageal reflux disease with esophagitis, without bleeding: Secondary | ICD-10-CM

## 2014-02-13 MED ORDER — PANTOPRAZOLE SODIUM 40 MG PO TBEC: 40.0000 mg | DELAYED_RELEASE_TABLET | Freq: Two times a day (BID) | ORAL | Status: DC

## 2014-02-13 NOTE — Patient Instructions (Addendum)
Breathing test showed moderate restriction OK to stop oxygen- use as needed only Increase protonix twice daily Stay on advair & spiriva

## 2014-02-13 NOTE — Assessment & Plan Note (Signed)
Increase Protonix 40 twice a day

## 2014-02-13 NOTE — Progress Notes (Signed)
Subjective:    Patient ID: Manuel Knight, male    DOB: August 04, 1960, 54 y.o.   MRN: [id number]  HPI  Chief Complaint  Patient presents with  . Advice Only    referred by cardiology; SOB; uses 2.5L pt should be using all the time, but refuses to use all the time because he states he "doesn't want to be dependent on anything"; hurts to breath right now; pain is more in the back than in the chest; wheezing all the time.  sometimes wakes up with mouth full of blood, pt says sometimes he will cough up blood   54 year old ex-smoker, presents for evaluation of COPD and cough. He underwent CABG in 01/2013, but states that he does not feel any better since then. He reports persistent dyspnea. He has been provided with oxygen at home-but only uses this as needed. He smoked about a pack and a half per day for 20 years-about 30-pack-years before he quit in 1990. He smoked marijuana for 40 years, quit 6 months ago, and states that his problems started since then. He has been placed on sacubitril since 11/2013, benazepril was stopped   Echo 11/215 EF 35%,WMA +, RVSP 38 Spirometry showed moderate restriction-with a ratio 73, FEV1 46%, FVC 50% He did not desaturate on walking 3 times around the office He is compliant with Advair Spiriva  He reports severe reflux, he reports a dry cough that occasionally keeps him up at night. His coughing spells cause dizziness  Past Medical History  Diagnosis Date  . Anxiety   . Pulmonary nodule   . Hyperlipidemia   . COPD (chronic obstructive pulmonary disease)   . Chronic back pain   . Obesity   . Diabetes mellitus, type 2   . Hypertension   . Cellulitis 2011    of neck   . GERD (gastroesophageal reflux disease)   . Claustrophobia   . Diverticulitis of colon 3295    complicated by abscess  . Heart attack     Past Surgical History  Procedure Laterality Date  . Appendectomy  2007    gangrenous appendicitis   . Tonsillectomy    . Coronary artery  bypass graft N/A 02/06/2013    Procedure: CORONARY ARTERY BYPASS GRAFTING (CABG) with TEE;  Surgeon: [name], MD;  Location: [facility];  Service: Open Heart Surgery;  Laterality: N/A;  CABG x three,  using left internal mammary artery and right leg greater saphenous vein harvested endoscopically  . Left heart catheterization with coronary angiogram N/A 02/03/2013    Procedure: LEFT HEART CATHETERIZATION WITH CORONARY ANGIOGRAM;  Surgeon: Jettie Booze, MD;  Location: [facility] CATH LAB;  Service: Cardiovascular;  Laterality: N/A;   Allergies  Allergen Reactions  . Tylenol [Acetaminophen] Hives  . Lodine [Etodolac] Swelling and Rash    Rash and swelling   . Propoxyphene N-Acetaminophen Other (See Comments)    unknown    History   Social History  . Marital Status: Single    Spouse Name: N/A    Number of Children: 2tw  . Years of Education: N/A   Occupational History  . Disabled    Social History Main Topics  . Smoking status: Former Smoker -- 1.50 packs/day for 19 years    Types: Cigarettes    Start date: 03/12/1967    Quit date: 01/13/1988  . Smokeless tobacco: Never Used  . Alcohol Use: No     Comment:    . Drug Use: No     Comment:  Formerly used marijuana for the pain, denies now.  . Sexual Activity: Not on file   Other Topics Concern  . Not on file   Social History Narrative   Lives with significant other of 18 years. They are raising his grandson.      Review of Systems  Constitutional: Positive for unexpected weight change. Negative for fever.  HENT: Positive for congestion. Negative for dental problem, ear pain, nosebleeds, postnasal drip, rhinorrhea, sinus pressure, sneezing, sore throat and trouble swallowing.   Eyes: Negative for redness and itching.  Respiratory: Positive for cough, choking, chest tightness, shortness of breath and wheezing.   Cardiovascular: Positive for chest pain. Negative for palpitations and leg swelling.  Gastrointestinal: Negative  for nausea and vomiting.  Genitourinary: Negative for dysuria.  Musculoskeletal: Negative for joint swelling.  Skin: Negative for rash.  Neurological: Negative for headaches.  Hematological: Does not bruise/bleed easily.  Psychiatric/Behavioral: Negative for dysphoric mood. The patient is not nervous/anxious.        Objective:   Physical Exam  Gen. Pleasant, obese, in no distress, normal affect ENT - no lesions, no post nasal drip, class 2-3 airway Neck: No JVD, no thyromegaly, no carotid bruits Lungs: no use of accessory muscles, no dullness to percussion, coarse BS without rales or rhonchi  Cardiovascular: Rhythm regular, heart sounds  normal, no murmurs or gallops, no peripheral edema Abdomen: soft and non-tender, no hepatosplenomegaly, BS normal. Musculoskeletal: No deformities, no cyanosis or clubbing Neuro:  alert, non focal, no tremors       Assessment & Plan:

## 2014-02-13 NOTE — Assessment & Plan Note (Signed)
He will continue Advair and Spiriva He does not desaturate-as such I have asked him to continue using oxygen during sleep, but okay to stop in the daytime. His cough-maybe related to severe reflux or postnasal drip. It does seem like sacubitril is associated with cough about 10% of the times-but it does seem like he needs this medication for his heart

## 2014-03-02 ENCOUNTER — Ambulatory Visit (INDEPENDENT_AMBULATORY_CARE_PROVIDER_SITE_OTHER): Payer: Medicare Other | Admitting: Internal Medicine

## 2014-03-02 ENCOUNTER — Encounter: Payer: Self-pay | Admitting: Internal Medicine

## 2014-03-02 VITALS — BP 102/74 | HR 87 | Ht 69.5 in | Wt 267.0 lb

## 2014-03-02 DIAGNOSIS — I2510 Atherosclerotic heart disease of native coronary artery without angina pectoris: Secondary | ICD-10-CM

## 2014-03-02 DIAGNOSIS — I5022 Chronic systolic (congestive) heart failure: Secondary | ICD-10-CM

## 2014-03-02 DIAGNOSIS — K529 Noninfective gastroenteritis and colitis, unspecified: Secondary | ICD-10-CM

## 2014-03-02 NOTE — Assessment & Plan Note (Signed)
His heart failure symptoms are stable. He is class 2. He will need to have his 2D echo repeated. Will await results. I suspect his EF will be above 35%.

## 2014-03-02 NOTE — Assessment & Plan Note (Signed)
He will continue his bronchodilators. He denies cough and is no longer using supplemental oxygen. He will continue his current meds. He no longer smokes.

## 2014-03-02 NOTE — Progress Notes (Signed)
HPI [name] returns today for reconsideration of an ICD. He is a very pleasant 54 year old man who sustained a non-ST elevation MI approximately 16 months ago. He underwent bypass grafting after he was found to have severe three-vessel coronary disease. Postoperatively, his ejection fraction was 30%. On repeat evaluation 4 months month ago, his ejection fraction was 35%. On benazepril, the patient developed a severe cough which was discontinued. He was placed on Entresto. Since then, he is much improved. His heart failure symptoms have gone from class III to class II. His cough is also much better though not resolved completely. No syncope. No anginal symptoms. He has had problems with chronic back pain. The etiology is unclear. He still has some cough which is non-productive. Finally, over the past week, he has had problems with diarrhea although no one at his home has had similar symptoms. Allergies  Allergen Reactions  . Tylenol [Acetaminophen] Hives  . Lodine [Etodolac] Swelling and Rash    Rash and swelling   . Propoxyphene N-Acetaminophen Other (See Comments)    unknown     Current Outpatient Prescriptions  Medication Sig Dispense Refill  . alprazolam (XANAX) 2 MG tablet Take 1 tablet by mouth 4 (four) times daily.    Marland Kitchen amLODipine (NORVASC) 10 MG tablet Take 10 mg by mouth daily.    Marland Kitchen amphetamine-dextroamphetamine (ADDERALL) 20 MG tablet Take 60 mg by mouth daily.    Marland Kitchen aspirin EC 81 MG tablet Take 1 tablet (81 mg total) by mouth daily.    Marland Kitchen atorvastatin (LIPITOR) 80 MG tablet Take 1 tablet by mouth daily.    . Canagliflozin (INVOKANA) 100 MG TABS Take 100 mg by mouth daily.    . carvedilol (COREG) 12.5 MG tablet Take 1 tablet (12.5 mg total) by mouth 2 (two) times daily. 60 tablet 6  . citalopram (CELEXA) 20 MG tablet Take 1 tablet by mouth daily.    . cloNIDine (CATAPRES) 0.3 MG tablet Take 1 tablet by mouth 2 (two) times daily.    . Fluticasone-Salmeterol (ADVAIR DISKUS)  500-50 MCG/DOSE AEPB Inhale 1 puff into the lungs every 12 (twelve) hours.      . insulin aspart (NOVOLOG FLEXPEN) 100 UNIT/ML FlexPen Inject 0-26 Units into the skin 3 (three) times daily with meals. 20 units three times a day with sliding scale max of 26 units    . meloxicam (MOBIC) 15 MG tablet Take 1 tablet by mouth daily.    . metFORMIN (GLUCOPHAGE) 1000 MG tablet Take 1,000 mg by mouth 2 (two) times daily with a meal.    . Oxycodone HCl 10 MG TABS Take 1 tablet by mouth 2 (two) times daily.    . pantoprazole (PROTONIX) 40 MG tablet Take 1 tablet (40 mg total) by mouth 2 (two) times daily. 60 tablet 6  . PROAIR HFA 108 (90 BASE) MCG/ACT inhaler Inhale 2 puffs into the lungs every 4 (four) hours as needed.    . Sacubitril-Valsartan (ENTRESTO) 49-51 MG TABS Take 1 tablet by mouth 2 (two) times daily. 60 tablet 6  . tiotropium (SPIRIVA) 18 MCG inhalation capsule Place 18 mcg into inhaler and inhale daily.       No current facility-administered medications for this visit.     Past Medical History  Diagnosis Date  . Anxiety   . Pulmonary nodule   . Hyperlipidemia   . COPD (chronic obstructive pulmonary disease)   . Chronic back pain   . Obesity   . Diabetes  mellitus, type 2   . Hypertension   . Cellulitis 2011    of neck   . GERD (gastroesophageal reflux disease)   . Claustrophobia   . Diverticulitis of colon 4270    complicated by abscess  . Heart attack     ROS:   All systems reviewed and negative except as noted in the HPI.   Past Surgical History  Procedure Laterality Date  . Appendectomy  2007    gangrenous appendicitis   . Tonsillectomy    . Coronary artery bypass graft N/A 02/06/2013    Procedure: CORONARY ARTERY BYPASS GRAFTING (CABG) with TEE;  Surgeon: [name], MD;  Location: [facility];  Service: Open Heart Surgery;  Laterality: N/A;  CABG x three,  using left internal mammary artery and right leg greater saphenous vein harvested endoscopically  . Left heart  catheterization with coronary angiogram N/A 02/03/2013    Procedure: LEFT HEART CATHETERIZATION WITH CORONARY ANGIOGRAM;  Surgeon: Jettie Booze, MD;  Location: [facility] CATH LAB;  Service: Cardiovascular;  Laterality: N/A;     Family History  Problem Relation Age of Onset  . Heart attack Father   . Diabetes Sister   . Coronary artery disease Sister   . Heart attack Brother   . Colon cancer Neg Hx   . Cancer Mother     "blood transplant"  . Coronary artery disease Mother      History   Social History  . Marital Status: Single    Spouse Name: N/A  . Number of Children: 2tw  . Years of Education: N/A   Occupational History  . Disabled    Social History Main Topics  . Smoking status: Former Smoker -- 1.50 packs/day for 19 years    Types: Cigarettes    Start date: 03/12/1967    Quit date: 01/13/1988  . Smokeless tobacco: Never Used  . Alcohol Use: No     Comment:    . Drug Use: No     Comment: Formerly used marijuana for the pain, denies now.  . Sexual Activity: Not on file   Other Topics Concern  . Not on file   Social History Narrative   Lives with significant other of 18 years. They are raising his grandson.     BP 102/74 mmHg  Pulse 87  Ht 5' 9.5" (1.765 m)  Wt 267 lb (121.11 kg)  BMI 38.88 kg/m2  SpO2 93%  Physical Exam:  Well appearing 54 yo man, NAD HEENT: Unremarkable Neck:  No JVD, no thyromegally Back:  No CVA tenderness Lungs:  Clear with no wheezes HEART:  Regular rate rhythm, no murmurs, no rubs, no clicks Abd:  soft, positive bowel sounds, no organomegally, no rebound, no guarding Ext:  2 plus pulses, no edema, no cyanosis, no clubbing Skin:  No rashes no nodules Neuro:  CN II through XII intact, motor grossly intact   Assess/Plan:

## 2014-03-02 NOTE — Assessment & Plan Note (Signed)
He denies anginal symptoms. He will continue his current meds. I do not think his back pain is an anginal equivalent.

## 2014-03-02 NOTE — Assessment & Plan Note (Signed)
His diarrhea has been intermittent. He denies bloody diarrhea. It is unclear whether this is due to a GI virus or some time of inflammatory process. If not improved, he is encouraged to contact his primary MD.

## 2014-03-02 NOTE — Patient Instructions (Signed)
Your physician wants you to follow-up in: 6 months with Dr. Lovena Le. You will receive a reminder letter in the mail two months in advance. If you don't receive a letter, please call our office to schedule the follow-up appointment.  Your physician recommends that you continue on your current medications as directed. Please refer to the Current Medication list given to you today.  Thank you for choosing [facility]!

## 2014-06-13 ENCOUNTER — Other Ambulatory Visit: Payer: Self-pay | Admitting: Cardiovascular Disease

## 2014-07-16 ENCOUNTER — Other Ambulatory Visit: Payer: Self-pay | Admitting: Cardiovascular Disease

## 2014-08-14 ENCOUNTER — Other Ambulatory Visit: Payer: Self-pay | Admitting: Cardiovascular Disease

## 2014-09-13 ENCOUNTER — Ambulatory Visit: Payer: Medicare Other | Admitting: Internal Medicine

## 2014-09-14 ENCOUNTER — Other Ambulatory Visit: Payer: Self-pay | Admitting: Pulmonary Disease

## 2014-12-18 ENCOUNTER — Other Ambulatory Visit: Payer: Self-pay | Admitting: Internal Medicine

## 2014-12-22 ENCOUNTER — Inpatient Hospital Stay
Admit: 2014-12-22 | Discharge: 2014-12-23 | Disposition: A | Discharge status: Home / Self Care | Payer: MEDICARE | Attending: Internal Medicine | Admitting: Internal Medicine

## 2014-12-22 ENCOUNTER — Emergency Department: Admit: 2014-12-23 | Payer: MEDICARE | Primary: Family Medicine

## 2014-12-22 DIAGNOSIS — L03211 Cellulitis of face: Principal | ICD-10-CM

## 2014-12-22 DIAGNOSIS — R6510 Systemic inflammatory response syndrome (SIRS) of non-infectious origin without acute organ dysfunction: Secondary | ICD-10-CM | POA: Insufficient documentation

## 2014-12-22 LAB — GLUCOSE, POC: Glucose (POC): 419 mg/dL — ABNORMAL HIGH (ref 65–100)

## 2014-12-22 MED ORDER — SODIUM CHLORIDE 0.9 % IJ SYRG
INTRAMUSCULAR | Status: DC | PRN
Start: 2014-12-22 — End: 2014-12-22
  Administered 2014-12-23: 02:00:00 via INTRAVENOUS

## 2014-12-22 MED ORDER — CLINDAMYCIN IN D5W 900 MG/50 ML IV PIGGY BACK
900 mg/50 mL | INTRAVENOUS | Status: AC
Start: 2014-12-22 — End: 2014-12-22
  Administered 2014-12-23: via INTRAVENOUS

## 2014-12-22 MED ORDER — SODIUM CHLORIDE 0.9 % IJ SYRG
Freq: Three times a day (TID) | INTRAMUSCULAR | Status: DC
Start: 2014-12-22 — End: 2014-12-22
  Administered 2014-12-23: 03:00:00 via INTRAVENOUS

## 2014-12-22 MED ORDER — SODIUM CHLORIDE 0.9% BOLUS IV
0.9 % | INTRAVENOUS | Status: AC
Start: 2014-12-22 — End: 2014-12-22
  Administered 2014-12-23: via INTRAVENOUS

## 2014-12-22 MED FILL — SODIUM CHLORIDE 0.9 % IV: INTRAVENOUS | Qty: 1000

## 2014-12-22 MED FILL — BD POSIFLUSH NORMAL SALINE 0.9 % INJECTION SYRINGE: INTRAMUSCULAR | Qty: 10

## 2014-12-22 MED FILL — CLEOCIN 900 MG/50 ML IN 5 % DEXTROSE INTRAVENOUS PIGGYBACK: 900 mg/50 mL | INTRAVENOUS | Qty: 50

## 2014-12-22 NOTE — ED Notes (Signed)
Dr. Samuella CotaPandya and Clifton CustardAaron, NP at bedside

## 2014-12-22 NOTE — ED Notes (Signed)
Pt tolerating ice water (Ok with Dr. Babette RelicAtri). IVF infusing via pump without difficulty. Sister at bedside. Call bell within reach, will continue to monitor.

## 2014-12-22 NOTE — Consults (Signed)
See Admit H&P.

## 2014-12-22 NOTE — Progress Notes (Signed)
[facility]: MED RECONCILIATION    Comments/Recommendations:   ? Please interpret medication list with caution. The patient is a poor historian as to his medications. The patient's significant other Festus Aloeerri Lynn was phoned [phone]) with no answer. Voicemail left  ? List below compiled using prescription refill history from Rx Query  ? [place]. He has not taken medications today as he started driving towards  at 4am this morning  ? Requested that if the patient speak with Festus Aloeerri Lynn to request she bring in a list or pill bottles to the hospital in the morning  ? Pharmacy: [facility] [place], [place] Phone: [phone]  ? Verifies allergies  Medications added:     ?? Amlodipine  ?? Canagliflozin  ?? Carvedilol  ?? Citalopram  ?? Clonidine  ?? Advair  ?? Gabapentin  ?? Novolog Flex pen  ?? Levemir  ?? Meloxicam  ?? Naloxone nasal spray  ?? Oxycodone  ?? Pantoprazole  ?? Tiotpropium    Medications removed:    ?? None    Medications adjusted:    ?? None    Information obtained from: Patient, Rx Query    Significant PMH/Disease States:   Patient Active Problem List   Diagnosis Code   ??? Facial cellulitis L03.211   ??? HTN (hypertension) I10   ??? CAD (coronary artery disease) I25.10   ??? DM (diabetes mellitus) (HCC) E11.9   ??? SIRS (systemic inflammatory response syndrome) (HCC) R65.10   ??? Elevated serum creatinine R79.89   ??? COPD (chronic obstructive pulmonary disease) (HCC) J44.9     Past Medical History   Diagnosis Date   ??? COPD (chronic obstructive pulmonary disease) (HCC) 12/22/2014   ??? Diabetes (HCC)    ??? Heart attack (HCC)    ??? Hypertension      Chief Complaint for this Admission:   Chief Complaint   Patient presents with   ??? Facial Swelling     Allergies: Lodine [etodolac]    Prior to Admission Medications:     Prior to Admission Medications   Prescriptions Last Dose Informant Patient Reported? Taking?   albuterol (PROAIR HFA) 90 mcg/actuation inhaler 12/22/2014 at Unknown time   Yes Yes   Sig: Take 1 Puff by inhalation every four (4) hours as needed.   albuterol (PROVENTIL VENTOLIN) 2.5 mg /3 mL (0.083 %) nebulizer solution 12/21/2014 at Unknown time  Yes Yes   Sig: 2.5 mg by Nebulization route two (2) times a day.   amLODIPine (NORVASC) 10 mg tablet 12/21/2014 at Unknown time  Yes Yes   Sig: Take 10 mg by mouth daily.   amoxicillin (AMOXIL) 500 mg capsule 12/22/2014 at Unknown time  Yes Yes   Sig: Take 500 mg by mouth two (2) times a day. X 7 days   atorvastatin (LIPITOR) 80 mg tablet 12/21/2014 at Unknown time  Yes Yes   Sig: Take 80 mg by mouth daily.   canagliflozin (INVOKANA) 100 mg tablet 12/21/2014 at Unknown time  Yes Yes   Sig: Take 100 mg by mouth Daily (before breakfast).   carvedilol (COREG) 12.5 mg tablet   Yes No   Sig: Take 12.5 mg by mouth two (2) times daily (with meals).   citalopram (CELEXA) 20 mg tablet 12/21/2014 at Unknown time  Yes Yes   Sig: Take 20 mg by mouth daily.   cloNIDine HCl (CATAPRES) 0.3 mg tablet 12/21/2014 at Unknown time  Yes Yes   Sig: Take 0.3 mg  by mouth two (2) times a day.   fluticasone-salmeterol (ADVAIR DISKUS) 500-50 mcg/dose diskus inhaler 12/21/2014 at Unknown time  Yes Yes   Sig: Take 1 Puff by inhalation every twelve (12) hours.   gabapentin (NEURONTIN) 600 mg tablet 12/21/2014 at Unknown time  Yes Yes   Sig: Take 600 mg by mouth three (3) times daily.   insulin aspart (NOVOLOG FLEXPEN) 100 unit/mL inpn 12/21/2014 at Unknown time  Yes Yes   Sig: by SubCUTAneous route three (3) times daily (after meals). Sliding scale (Patient reports he takes this after meals)   insulin detemir (LEVEMIR FLEXPEN) 100 unit/mL (3 mL) inpn 12/21/2014 at Unknown time  Yes Yes   Sig: 10 Units by SubCUTAneous route daily.   meloxicam (MOBIC) 15 mg tablet   Yes No   Sig: Take 15 mg by mouth two (2) times a day.   metFORMIN (GLUCOPHAGE) 1,000 mg tablet 12/22/2014 at Unknown time  Yes Yes   Sig: Take 1,000 mg by mouth two (2) times daily (with meals).    naloxone [facility]) 4 mg/actuation spry   Yes Yes   Sig: 4 mg by Nasal route as needed (oversedation with narcotic pain medicine).   oxyCODONE IR (ROXICODONE) 10 mg tab immediate release tablet 12/21/2014 at Unknown time  Yes Yes   Sig: Take 10 mg by mouth every eight (8) hours as needed for Pain. Indications: PAIN, rheumatoid arthritis   pantoprazole (PROTONIX) 40 mg tablet 12/21/2014 at Unknown time  Yes Yes   Sig: Take 40 mg by mouth Before breakfast and dinner.   tiotropium (SPIRIVA WITH HANDIHALER) 18 mcg inhalation capsule 12/21/2014 at Unknown time  Yes Yes   Sig: Take 1 Cap by inhalation daily.      Facility-Administered Medications: None      Thank you,    Shawnie Pons, PharmD, BCPS

## 2014-12-22 NOTE — ED Provider Notes (Signed)
HPI Comments: Patient presents to the ED with left sided facial swelling for 2 days.  Initially began with what seemed to him like a few ingrown hairs.  Saw his pcp and was given amoxicillin, which he has been taking for 2 days.  Today, noted swelling and pain to have increased.  Denies fever or other symptoms except for localized pain.    The history is provided by the patient.        Past Medical History:   Diagnosis Date   ??? Diabetes (HCC)    ??? Heart attack (HCC)    ??? Hypertension        Past Surgical History:   Procedure Laterality Date   ??? Hx coronary artery bypass graft           No family history on file.    Social History     Social History   ??? Marital status: N/A     Spouse name: N/A   ??? Number of children: N/A   ??? Years of education: N/A     Occupational History   ??? Not on file.     Social History Main Topics   ??? Smoking status: Former Smoker   ??? Smokeless tobacco: Never Used   ??? Alcohol use No   ??? Drug use: Yes     Special: Marijuana      Comment: quit about a year ago   ??? Sexual activity: Not on file     Other Topics Concern   ??? Not on file     Social History Narrative   ??? No narrative on file         ALLERGIES: Review of patient's allergies indicates no known allergies.    Review of Systems   Constitutional: Negative.  Negative for appetite change, fever and unexpected weight change.   HENT: Negative.  Negative for ear pain, hearing loss, nosebleeds, rhinorrhea, sore throat and trouble swallowing.    Respiratory: Negative.  Negative for cough, chest tightness and shortness of breath.    Cardiovascular: Negative.  Negative for chest pain and palpitations.   Gastrointestinal: Negative.  Negative for abdominal distention, abdominal pain, blood in stool and vomiting.   Endocrine: Negative.    Genitourinary: Negative for dysuria and hematuria.   Musculoskeletal: Negative.  Negative for back pain and myalgias.   Skin: Positive for wound. Negative for rash.   Allergic/Immunologic: Negative.     Neurological: Negative.  Negative for dizziness, syncope, weakness and numbness.   Hematological: Negative.    Psychiatric/Behavioral: Negative.    All other systems reviewed and are negative.      There were no vitals filed for this visit.         Physical Exam   Constitutional: He is oriented to person, place, and time. He appears well-developed and well-nourished. No distress.   HENT:   Head: Normocephalic and atraumatic.       Right Ear: External ear normal.   Left Ear: External ear normal.   Nose: Nose normal.   Mouth/Throat: Oropharynx is clear and moist.   No intraoral swelling.  No airway involvement.  No drainage from Stenson's duct.   Eyes: Conjunctivae and EOM are normal. Pupils are equal, round, and reactive to light.   Neck: Normal range of motion. Neck supple. No JVD present. No thyromegaly present.   Cardiovascular: Normal rate, regular rhythm, normal heart sounds and intact distal pulses.    No murmur heard.  Pulmonary/Chest: Effort normal and breath sounds  normal. No respiratory distress. He has no wheezes. He has no rales.   Abdominal: Soft. Bowel sounds are normal. He exhibits no distension. There is no tenderness.   Musculoskeletal: Normal range of motion. He exhibits no edema.   Neurological: He is alert and oriented to person, place, and time. No cranial nerve deficit.   Skin: Skin is warm and dry. No rash noted.   Psychiatric: He has a normal mood and affect. His behavior is normal. Thought content normal.   Vitals reviewed.       MDM  Number of Diagnoses or Management Options  Diagnosis management comments: Facial abscess/folliculitis.  Patient is diabetic and has a blood sugar initially of 419.  Will obtain labs, CT, treat hyperglycemia and administer IV antibiotics.  Based upon CT findings will make decision to locally incise under ultrasound guidance or involve consultative service.  Case signed out to Dr. Babette Relic at 7pm pending results  of lab data and CT scan and final treatment and disposition.       Amount and/or Complexity of Data Reviewed  Clinical lab tests: ordered  Tests in the radiology section of CPT??: ordered  Discuss the patient with other providers: yes    Risk of Complications, Morbidity, and/or Mortality  Presenting problems: moderate  Diagnostic procedures: moderate  Management options: moderate    Patient Progress  Patient progress: stable    ED Course       Procedures

## 2014-12-22 NOTE — ED Notes (Signed)
Pt states " my left eye feels better, not as swollen." pus continues to ooze from left cheek

## 2014-12-22 NOTE — Progress Notes (Addendum)
Primary Nurse Dorthey SawyerEunice Starlina Lapre and Francine GravenElizabeth S, RN performed a dual skin assessment on this patient Impairment noted- see wound doc flow sheet  Braden score is 22  1. Facial wound with pus  2 Tender area to left great toe.   Patient refused total skin assessment, skin assessment incomplete due to patient getting agitated. ]  2300 Admission data incomplete; patient states they are unnecessary, he also stated that the [facility] [place] don't ask all these questions, and then was reluctant answering further questions.  Patient states that he wants to sleep.  0400 Patient refused to have vital stats done.  0600 Patient has refused pain medication offered twice, he denies pain, moderate amount of pus continues to drain from area to left cheek.  [id number] Bedside and Verbal shift change report given to Morrie SheldonAshley RN (oncoming nurse) by Lynder ParentsEunice RN (offgoing nurse). Report included the following information SBAR, Kardex and MAR.

## 2014-12-22 NOTE — ED Notes (Addendum)
Dr. Babette RelicAtri at bedside to discuss possible admission

## 2014-12-22 NOTE — ED Notes (Signed)
Dr. Atri at bedside

## 2014-12-22 NOTE — Progress Notes (Addendum)
[facility]: 12/22/14    Consult for Vancomycin by Dr. Verlin FesterBeveridge  Pharmacist reviewed antibiotic appropriateness for  54 y.o. year old ,male for indication of facial cellulitis.  Day of Therapy 1    Current Antimicrobial Therapy:  Vancomycin 2250mg  IVPB x1 Load then 1750mg  IVPB q12h  Trough goal: 10-5715mcg/mL    Previous Dosing Regimen N/a   Other Current Antibiotics Clindamycin 900mg  ivpb [facility] 12/22/14   Serum Creatinine/ BUN Lab Results   Component Value Date/Time    CREATININE 1.26 12/22/2014 06:55 PM       Creatinine Clearance Estimated Creatinine Clearance: 76.9 mL/min (based on Cr of 1.26).   Temp 98.5 ??F (36.9 ??C)    WBC Lab Results   Component Value Date/Time    WBC 18.3 12/22/2014 06:55 PM      H/H Lab Results   Component Value Date/Time    HGB 16.7 12/22/2014 06:55 PM      Platelets Lab Results   Component Value Date/Time    PLATELET 254 12/22/2014 06:55 PM        Significant Cultures: none      Pharmacist Judi Congenise C Wardlaw, PHARMD  Contact information: [phone]

## 2014-12-22 NOTE — ED Notes (Signed)
Discussed admission with patient. Agreeable with plan for admission for iv abx for facial cellulitis and dehydration.    8:40 PM  Joseph DunnLakshmi S Quanah Majka, MD spoke with Dr. Verlin FesterBeveridge, Consult for Hospitalist. Discussed available diagnostic tests and clinical findings. He/She is in agreement with care plans as outlined. He/she accepts patient for admission.

## 2014-12-22 NOTE — ED Notes (Signed)
Patient tachy 120s, dehydrated, marked Left facial swelling.

## 2014-12-22 NOTE — ED Notes (Signed)
[facility] they will call back when available

## 2014-12-22 NOTE — ED Notes (Signed)
Pt tolerating ice chips (ok with Dr. Samuella CotaPandya)

## 2014-12-22 NOTE — ED Notes (Signed)
Attempted to call pt's girlfriend, Aurther Lofterry [phone] to obtain medicine list, but no answer.  Pt states he is on inhalers and neb treatments at home.

## 2014-12-22 NOTE — ED Triage Notes (Addendum)
Pt pulled out a couple hairs from left side of face on tues, went to [facility] on wed and was given amox.   Left side of face is swollen and discolored. Pt ambulated to room. Pt swallowing without difficulty.

## 2014-12-22 NOTE — ED Notes (Signed)
Time out: [facility]  Pt stable at time of transfer. IVF infusing to gravity. Pt transferred on cardiac monitor. Pt's belongings sent with pt as well as EMTALA.

## 2014-12-22 NOTE — ED Notes (Signed)
IVF and IV antibiotics infusing via pump without difficulty. Pt remains on cardiac monitor x 4. Call bell within reach, pillows for comfort. Family at bedside. Will continue to monitor.

## 2014-12-22 NOTE — Other (Signed)
TRANSFER - OUT REPORT:    Verbal report given to Dorthey SawyerEunice Wiafe, RN on Joseph Farmer  [facility]       Report consisted of patient???s Situation, Background, Assessment and   Recommendations(SBAR).     Information from the following report(s) SBAR was reviewed with the receiving nurse.    Lines:   Peripheral IV 12/22/14 Right Antecubital (Active)   Site Assessment Clean, dry, & intact 12/22/2014  6:55 PM   Phlebitis Assessment 0 12/22/2014  6:55 PM   Infiltration Assessment 0 12/22/2014  6:55 PM   Dressing Status Clean, dry, & intact 12/22/2014  6:55 PM   Dressing Type Tape;Transparent 12/22/2014  6:55 PM   Hub Color/Line Status Pink;Flushed 12/22/2014  6:55 PM   Alcohol Cap Used Yes 12/22/2014  6:55 PM        Opportunity for questions and clarification was provided.      Patient transported with:   EMTALA  Clothes  Cell  wallet

## 2014-12-22 NOTE — Progress Notes (Signed)
Pharmacy changed lovenox to 40 mg q12H, for BMI>40, per anticoagulant protocol.

## 2014-12-22 NOTE — ED Notes (Signed)
BS is 419, Dr. Samuella CotaPandya aware.

## 2014-12-22 NOTE — ED Notes (Signed)
Re-evaluation-pus draining from facial wound. HR 111.

## 2014-12-22 NOTE — H&P (Signed)
[facility]  [address] [place], [place]  [address]  [id number]    Admission History and Physical      NAME:  Joseph Farmer   DOB:   1960/04/02   MRN:  [id number]     PCP:  Phys Other, MD     Date/Time:  12/22/2014         Subjective:     CHIEF COMPLAINT: swelling     HISTORY OF PRESENT ILLNESS:     Mr. Purves is a 54 y.o.  Caucasian male who is admitted with facial cellulitis.  Mr. Kopf presented to the [facility] Emergency Department this PM complaining of swelling: left face, for the last 2 days, seen by PCP and started on amoxicillin, not improving; swelling is severe and constant, associated with erythema, purulent drainage and pain    History obtained from chart review and the patient.       Past Medical History   Diagnosis Date   ??? Diabetes (HCC)    ??? Heart attack (HCC)    ??? Hypertension         Past Surgical History   Procedure Laterality Date   ??? Hx coronary artery bypass graft         Social History   Substance Use Topics   ??? Smoking status: Former Smoker   ??? Smokeless tobacco: Never Used   ??? Alcohol use No        Family History   Problem Relation Age of Onset   ??? Diabetes Other    ??? Hypertension Other         Allergies   Allergen Reactions   ??? Lodine [Etodolac] Hives        Prior to Admission medications    Medication Sig Start Date End Date Taking? Authorizing Provider   metFORMIN (GLUCOPHAGE) 1,000 mg tablet Take 1,000 mg by mouth two (2) times daily (with meals).   Yes Phys Other, MD   OTHER Pt on about 18 different medicines, unsure of names   Yes Phys Other, MD   amoxicillin (AMOXIL) 500 mg capsule Take 500 mg by mouth two (2) times a day.   Yes Phys Other, MD   albuterol (PROAIR HFA) 90 mcg/actuation inhaler Take  by inhalation.   Yes Phys Other, MD         Review of Systems:  (bold if positive, if negative)    Gen:  Eyes:  ENT:  CVS:  Pulm:  GI:    GU:    MS:  Skin:  erythema, abscess, woundPsych:  Endo:    Hem:  Renal:    Neuro:             Objective:      VITALS:    Vital signs reviewed; most recent are:    Visit Vitals   ??? BP (!) 163/91 (BP 1 Location: Left arm, BP Patient Position: At rest)   ??? Pulse (!) 110   ??? Temp 98.3 ??F (36.8 ??C)   ??? Resp 26   ??? Ht 5\' 4"  (1.626 m)   ??? Wt 114 kg (251 lb 5.2 oz)   ??? SpO2 98%   ??? BMI 43.14 kg/m2     SpO2 Readings from Last 6 Encounters:   12/22/14 98%        No intake or output data in the 24 hours ending 12/22/14 2218         Exam:     Physical  Exam:    Gen: Well-developed, well-nourished, in no acute distress  HEENT:  Pink conjunctivae, PERRL, hearing intact to voice, moist mucous membranes  Neck: Supple, without masses, thyroid non-tender  Resp: No accessory muscle use, clear breath sounds without wheezes rales or rhonchi  Card: No murmurs, tachy S1, S2 without thrills, bruits or peripheral edema  Abd:  Soft, non-tender, non-distended, normoactive bowel sounds are present, no palpable organomegaly and no detectable hernias  Lymph:  No cervical or inguinal adenopathy  Musc: No cyanosis or clubbing  Skin: Marked induration and erythema of left cheek with increased warmth and tenderness, areas of purulence  Neuro:  Cranial nerves are grossly intact, no focal motor weakness, follows commands appropriately  Psych:  Good insight, oriented to person, place and time, alert       Labs:    Recent Labs      12/22/14   1855   WBC  18.3*   HGB  16.7   HCT  49.1   PLT  254     Recent Labs      12/22/14   1855   NA  131*   K  4.4   CL  93*   CO2  26   GLU  427*   BUN  18   CREA  1.26   CA  8.7     Lab Results   Component Value Date/Time    GLUCOSE (POC) 419 12/22/2014 06:39 PM     No results for input(s): PH, PCO2, PO2, HCO3, FIO2 in the last 72 hours.  No results for input(s): INR in the last 72 hours.    No lab exists for component: INREXT, INREXT    Additional testing:  Head and neck CT with cellulitis of left face without abscess       Assessment/Plan:       Principal Problem:    Facial cellulitis (12/22/2014)    - wound culture and MRSA screen ordered, [facility]   - clindamycin alone likely to be insufficient   - started vancomycin   - given superficial purulence I doubt this oral or odontogenic and so clindamycin is unlikely to add any significant coverage to the likely pathogen   - will hold off on steroids as he reports the swelling is already receding and his BS is already elevated   - PRN opiates for pain    Active Problems:    HTN (hypertension) (12/22/2014)   - BP elevated, likely due to pain   - he [facility] for [facility]   - continue home BP meds      CAD (coronary artery disease) (12/22/2014)   - stable, continue cardiac meds      DM (diabetes mellitus) (HCC) (12/22/2014)   - type 2, no complications   - check A1c   - follow BS on SSI with home meds when confirmed by Pharmacy (he takes insulin but can't remember the type/dose)      SIRS (systemic inflammatory response syndrome) (HCC) (12/22/2014)   - POA, due to acute infection   - does not appear to be septic at this time   - follow WBC on antibiotics      Elevated serum creatinine (12/22/2014)   - no history of CKD per patient   - may have ARF, does appear volume depleted   - follow creatinine on IV fluids      COPD   - clear lung exam now, unclear if he was  actually wheezing at [facility], he reports he got nebs because he was having difficulty breathing in a hot room   - continue inhaled meds       Code status:   - patient is FULL CODE      Total time spent with patient: 66 Minutes                  Care Plan discussed with: Patient, Nursing Staff and Dr. Babette Relic    Discussed:  Code Status, Care Plan and D/C Planning    Prophylaxis:  Lovenox and SCD's    Probable Disposition:  Home w/Family           ___________________________________________________    Attending Physician: Marijo File, MD

## 2014-12-22 NOTE — ED Notes (Signed)
Vancomycin ordered but not verified by pharmacy prior to transfer, see MAR. [facility]

## 2014-12-22 NOTE — ED Notes (Signed)
Received signout from Dr. Samuella CotaPandya pending facial CT and possible I+D for facial abscess.

## 2014-12-22 NOTE — Progress Notes (Signed)
TRANSFER - IN REPORT:    Verbal report received from April G. RN (name) on Loyce DysRussell Bolda  being received from [facility] (unit) for routine progression of care      Report consisted of patient???s Situation, Background, Assessment and   Recommendations(SBAR).     Information from the following report(s) SBAR, [facility], [facility] and Cardiac Rhythm ST was reviewed with the receiving nurse.    Opportunity for questions and clarification was provided.      Assessment completed upon patient???s arrival to unit and care assumed.

## 2014-12-23 LAB — CBC WITH AUTOMATED DIFF
ABS. BASOPHILS: 0.1 10*3/uL (ref 0.0–0.1)
ABS. EOSINOPHILS: 0.1 10*3/uL (ref 0.0–0.4)
ABS. LYMPHOCYTES: 2.2 10*3/uL
ABS. MONOCYTES: 2 10*3/uL — ABNORMAL HIGH (ref 0.0–1.0)
ABS. NEUTROPHILS: 14.1 10*3/uL — ABNORMAL HIGH (ref 1.8–8.0)
BASOPHILS: 0 % (ref 0–1)
EOSINOPHILS: 0 % (ref 0–7)
HCT: 49.1 % (ref 36.6–50.3)
HGB: 16.7 g/dL (ref 12.1–17.0)
LYMPHOCYTES: 12 % (ref 12–49)
MCH: 29.3 PG (ref 26.0–34.0)
MCHC: 34 g/dL (ref 30.0–36.5)
MCV: 86.1 FL (ref 80.0–99.0)
MONOCYTES: 11 % (ref 5–13)
NEUTROPHILS: 77 % — ABNORMAL HIGH (ref 32–75)
PLATELET: 254 10*3/uL (ref 150–400)
RBC: 5.7 M/uL (ref 4.10–5.70)
RDW: 13.4 % (ref 11.5–14.5)
WBC: 18.3 10*3/uL — ABNORMAL HIGH (ref 4.1–11.1)

## 2014-12-23 LAB — GLUCOSE, POC
Glucose (POC): 283 mg/dL — ABNORMAL HIGH (ref 65–100)
Glucose (POC): 247 mg/dL — ABNORMAL HIGH (ref 65–100)
Glucose (POC): 243 mg/dL — ABNORMAL HIGH (ref 65–100)

## 2014-12-23 LAB — METABOLIC PANEL, BASIC
Anion gap: 11 mmol/L (ref 5–15)
Anion gap: 12 mmol/L (ref 5–15)
BUN/Creatinine ratio: 12 (ref 12–20)
BUN/Creatinine ratio: 14 (ref 12–20)
BUN: 13 MG/DL (ref 6–20)
BUN: 18 MG/DL (ref 6–20)
CO2: 25 mmol/L (ref 21–32)
CO2: 26 mmol/L (ref 21–32)
Calcium: 8.2 MG/DL — ABNORMAL LOW (ref 8.5–10.1)
Calcium: 8.7 MG/DL (ref 8.5–10.1)
Chloride: 93 mmol/L — ABNORMAL LOW (ref 97–108)
Chloride: 98 mmol/L (ref 97–108)
Creatinine: 1.09 MG/DL (ref 0.70–1.30)
Creatinine: 1.26 MG/DL (ref 0.70–1.30)
GFR est AA: >60 mL/min/{1.73_m2} (ref >60)
GFR est AA: >60 mL/min/{1.73_m2} (ref >60)
GFR est non-AA: 60 mL/min/{1.73_m2} — ABNORMAL LOW (ref >60)
GFR est non-AA: >60 mL/min/{1.73_m2} (ref >60)
Glucose: 280 mg/dL — ABNORMAL HIGH (ref 65–100)
Glucose: 427 mg/dL — ABNORMAL HIGH (ref 65–100)
Potassium: 3.9 mmol/L (ref 3.5–5.1)
Potassium: 4.4 mmol/L (ref 3.5–5.1)
Sodium: 131 mmol/L — ABNORMAL LOW (ref 136–145)
Sodium: 134 mmol/L — ABNORMAL LOW (ref 136–145)

## 2014-12-23 LAB — MAGNESIUM: Magnesium: 1.8 mg/dL (ref 1.6–2.4)

## 2014-12-23 LAB — CBC W/O DIFF
HCT: 45.7 % (ref 36.6–50.3)
HGB: 15.6 g/dL (ref 12.1–17.0)
MCH: 30.1 PG (ref 26.0–34.0)
MCHC: 34.1 g/dL (ref 30.0–36.5)
MCV: 88.2 FL (ref 80.0–99.0)
PLATELET: 239 10*3/uL (ref 150–400)
RBC: 5.18 M/uL (ref 4.10–5.70)
RDW: 13.7 % (ref 11.5–14.5)
WBC: 17 10*3/uL — ABNORMAL HIGH (ref 4.1–11.1)

## 2014-12-23 LAB — LACTIC ACID: Lactic acid: 1.5 MMOL/L (ref 0.4–2.0)

## 2014-12-23 LAB — PHOSPHORUS: Phosphorus: 2.6 MG/DL (ref 2.6–4.7)

## 2014-12-23 LAB — HEMOGLOBIN A1C WITH EAG
Est. average glucose: 306 mg/dL
Hemoglobin A1c: 12.3 % — ABNORMAL HIGH (ref 4.2–6.3)

## 2014-12-23 MED ORDER — DEXTROSE 50% IN WATER (D50W) IV SYRG
INTRAVENOUS | Status: DC | PRN
Start: 2014-12-23 — End: 2014-12-23

## 2014-12-23 MED ORDER — ENOXAPARIN 40 MG/0.4 ML SUB-Q SYRINGE
40 mg/0.4 mL | SUBCUTANEOUS | Status: DC
Start: 2014-12-23 — End: 2014-12-22

## 2014-12-23 MED ORDER — SODIUM CHLORIDE 0.9 % IJ SYRG
Freq: Three times a day (TID) | INTRAMUSCULAR | Status: DC
Start: 2014-12-23 — End: 2014-12-23
  Administered 2014-12-23 (×3): via INTRAVENOUS

## 2014-12-23 MED ORDER — ACETAMINOPHEN 325 MG TABLET
325 mg | ORAL | Status: DC | PRN
Start: 2014-12-23 — End: 2014-12-23

## 2014-12-23 MED ORDER — SODIUM CHLORIDE 0.9 % IJ SYRG
INTRAMUSCULAR | Status: DC | PRN
Start: 2014-12-23 — End: 2014-12-23

## 2014-12-23 MED ORDER — SODIUM CHLORIDE 0.9 % INJECTION
25 mg/mL | Freq: Four times a day (QID) | INTRAMUSCULAR | Status: DC | PRN
Start: 2014-12-23 — End: 2014-12-22

## 2014-12-23 MED ORDER — ZOLPIDEM 5 MG TAB
5 mg | Freq: Every evening | ORAL | Status: DC | PRN
Start: 2014-12-23 — End: 2014-12-23
  Administered 2014-12-23: 07:00:00 via ORAL

## 2014-12-23 MED ORDER — ATORVASTATIN 20 MG TAB
20 mg | Freq: Every day | ORAL | Status: DC
Start: 2014-12-23 — End: 2014-12-23
  Administered 2014-12-23: 14:00:00 via ORAL

## 2014-12-23 MED ORDER — LEVOFLOXACIN 750 MG TAB
750 mg | ORAL_TABLET | Freq: Every day | ORAL | 0 refills | Status: AC
Start: 2014-12-23 — End: ?

## 2014-12-23 MED ORDER — IPRATROPIUM-ALBUTEROL 2.5 MG-0.5 MG/3 ML NEB SOLUTION
2.5 mg-0.5 mg/3 ml | RESPIRATORY_TRACT | Status: DC | PRN
Start: 2014-12-23 — End: 2014-12-23

## 2014-12-23 MED ORDER — IOPAMIDOL 61 % IV SOLN
300 mg iodine /mL (61 %) | Freq: Once | INTRAVENOUS | Status: AC
Start: 2014-12-23 — End: 2014-12-22
  Administered 2014-12-23: 01:00:00 via INTRAVENOUS

## 2014-12-23 MED ORDER — INSULIN LISPRO 100 UNIT/ML INJECTION
100 unit/mL | Freq: Four times a day (QID) | SUBCUTANEOUS | Status: DC
Start: 2014-12-23 — End: 2014-12-23
  Administered 2014-12-23: 17:00:00 via SUBCUTANEOUS

## 2014-12-23 MED ORDER — GLUCOSE 4 GRAM CHEWABLE TAB
4 gram | ORAL | Status: DC | PRN
Start: 2014-12-23 — End: 2014-12-23

## 2014-12-23 MED ORDER — AMLODIPINE 5 MG TAB
5 mg | Freq: Every day | ORAL | Status: DC
Start: 2014-12-23 — End: 2014-12-23
  Administered 2014-12-23: 14:00:00 via ORAL

## 2014-12-23 MED ORDER — SODIUM CHLORIDE 0.9 % IV
INTRAVENOUS | Status: DC
Start: 2014-12-23 — End: 2014-12-23
  Administered 2014-12-23 (×2): via INTRAVENOUS

## 2014-12-23 MED ORDER — SODIUM CHLORIDE 0.9% BOLUS IV
0.9 % | Freq: Once | INTRAVENOUS | Status: AC
Start: 2014-12-23 — End: 2014-12-22
  Administered 2014-12-23: 02:00:00 via INTRAVENOUS

## 2014-12-23 MED ORDER — IPRATROPIUM-ALBUTEROL 2.5 MG-0.5 MG/3 ML NEB SOLUTION
2.5 mg-0.5 mg/3 ml | RESPIRATORY_TRACT | Status: AC
Start: 2014-12-23 — End: 2014-12-22
  Administered 2014-12-23: 02:00:00 via RESPIRATORY_TRACT

## 2014-12-23 MED ORDER — CLONIDINE 0.1 MG TAB
0.1 mg | Freq: Two times a day (BID) | ORAL | Status: DC
Start: 2014-12-23 — End: 2014-12-23
  Administered 2014-12-23: 14:00:00 via ORAL

## 2014-12-23 MED ORDER — FLUTICASONE 200 MCG-VILANTEROL 25 MCG/DOSE BREATH ACTIVATED INHALER
200-25 mcg/dose | Freq: Every day | RESPIRATORY_TRACT | Status: DC
Start: 2014-12-23 — End: 2014-12-23
  Administered 2014-12-23: 14:00:00 via RESPIRATORY_TRACT

## 2014-12-23 MED ORDER — ALBUTEROL SULFATE 0.083 % (0.83 MG/ML) SOLN FOR INHALATION
2.5 mg /3 mL (0.083 %) | Freq: Two times a day (BID) | RESPIRATORY_TRACT | Status: DC
Start: 2014-12-23 — End: 2014-12-23
  Administered 2014-12-23: 15:00:00 via RESPIRATORY_TRACT

## 2014-12-23 MED ORDER — HYDROMORPHONE (PF) 1 MG/ML IJ SOLN
1 mg/mL | INTRAMUSCULAR | Status: DC | PRN
Start: 2014-12-23 — End: 2014-12-23

## 2014-12-23 MED ORDER — OXYCODONE-ACETAMINOPHEN 5 MG-325 MG TAB
5-325 mg | ORAL | Status: DC | PRN
Start: 2014-12-23 — End: 2014-12-23
  Administered 2014-12-23: 17:00:00 via ORAL

## 2014-12-23 MED ORDER — CITALOPRAM 20 MG TAB
20 mg | Freq: Every day | ORAL | Status: DC
Start: 2014-12-23 — End: 2014-12-23
  Administered 2014-12-23: 14:00:00 via ORAL

## 2014-12-23 MED ORDER — LEVOFLOXACIN 750 MG TAB
750 mg | ORAL | Status: DC
Start: 2014-12-23 — End: 2014-12-23
  Administered 2014-12-23: 16:00:00 via ORAL

## 2014-12-23 MED ORDER — VANCOMYCIN 10 GRAM IV SOLR
10 gram | Freq: Two times a day (BID) | INTRAVENOUS | Status: DC
Start: 2014-12-23 — End: 2014-12-23
  Administered 2014-12-23: 16:00:00 via INTRAVENOUS

## 2014-12-23 MED ORDER — GLUCAGON 1 MG INJECTION
1 mg | INTRAMUSCULAR | Status: DC | PRN
Start: 2014-12-23 — End: 2014-12-23

## 2014-12-23 MED ORDER — PANTOPRAZOLE 40 MG TAB, DELAYED RELEASE
40 mg | Freq: Two times a day (BID) | ORAL | Status: DC
Start: 2014-12-23 — End: 2014-12-23
  Administered 2014-12-23: 14:00:00 via ORAL

## 2014-12-23 MED ORDER — ALBUTEROL SULFATE HFA 90 MCG/ACTUATION AEROSOL INHALER
90 mcg/actuation | RESPIRATORY_TRACT | Status: DC | PRN
Start: 2014-12-23 — End: 2014-12-23

## 2014-12-23 MED ORDER — CARVEDILOL 12.5 MG TAB
12.5 mg | Freq: Two times a day (BID) | ORAL | Status: DC
Start: 2014-12-23 — End: 2014-12-23
  Administered 2014-12-23: 16:00:00 via ORAL

## 2014-12-23 MED ORDER — DOXYCYCLINE 100 MG CAP
100 mg | ORAL_CAPSULE | Freq: Two times a day (BID) | ORAL | 0 refills | Status: AC
Start: 2014-12-23 — End: 2015-01-02

## 2014-12-23 MED ORDER — ENOXAPARIN 40 MG/0.4 ML SUB-Q SYRINGE
40 mg/0.4 mL | Freq: Two times a day (BID) | SUBCUTANEOUS | Status: DC
Start: 2014-12-23 — End: 2014-12-23
  Administered 2014-12-23 (×2): via SUBCUTANEOUS

## 2014-12-23 MED ORDER — LABETALOL 5 MG/ML IV SYRINGE
20 mg/4 mL (5 mg/mL) | INTRAVENOUS | Status: DC | PRN
Start: 2014-12-23 — End: 2014-12-23

## 2014-12-23 MED ORDER — INSULIN LISPRO 100 UNIT/ML INJECTION
100 unit/mL | Freq: Once | SUBCUTANEOUS | Status: AC
Start: 2014-12-23 — End: 2014-12-23
  Administered 2014-12-23: 07:00:00 via SUBCUTANEOUS

## 2014-12-23 MED ORDER — SODIUM CHLORIDE 0.9 % IV
10 gram | Freq: Once | INTRAVENOUS | Status: AC
Start: 2014-12-23 — End: 2014-12-23
  Administered 2014-12-23: 05:00:00 via INTRAVENOUS

## 2014-12-23 MED ORDER — INSULIN GLARGINE 100 UNIT/ML INJECTION
100 unit/mL | Freq: Every day | SUBCUTANEOUS | Status: DC
Start: 2014-12-23 — End: 2014-12-23
  Administered 2014-12-23: 14:00:00 via SUBCUTANEOUS

## 2014-12-23 MED ORDER — DOXYCYCLINE HYCLATE 100 MG TAB
100 mg | Freq: Once | ORAL | Status: AC
Start: 2014-12-23 — End: 2014-12-23
  Administered 2014-12-23: 17:00:00 via ORAL

## 2014-12-23 MED ORDER — PROCHLORPERAZINE EDISYLATE 5 MG/ML INJECTION
5 mg/mL | Freq: Four times a day (QID) | INTRAMUSCULAR | Status: DC | PRN
Start: 2014-12-23 — End: 2014-12-23

## 2014-12-23 MED ORDER — GABAPENTIN 300 MG CAP
300 mg | Freq: Three times a day (TID) | ORAL | Status: DC
Start: 2014-12-23 — End: 2014-12-23
  Administered 2014-12-23: 14:00:00 via ORAL

## 2014-12-23 MED ORDER — CANAGLIFLOZIN 100 MG TABLET
100 mg | Freq: Every day | ORAL | Status: DC
Start: 2014-12-23 — End: 2014-12-23
  Administered 2014-12-23: 14:00:00 via ORAL

## 2014-12-23 MED FILL — AMLODIPINE 5 MG TAB: 5 mg | ORAL | Qty: 2

## 2014-12-23 MED FILL — CARVEDILOL 12.5 MG TAB: 12.5 mg | ORAL | Qty: 1

## 2014-12-23 MED FILL — VANCOMYCIN 10 GRAM IV SOLR: 10 gram | INTRAVENOUS | Qty: 2250

## 2014-12-23 MED FILL — BD POSIFLUSH NORMAL SALINE 0.9 % INJECTION SYRINGE: INTRAMUSCULAR | Qty: 10

## 2014-12-23 MED FILL — PANTOPRAZOLE 40 MG TAB, DELAYED RELEASE: 40 mg | ORAL | Qty: 1

## 2014-12-23 MED FILL — VANCOMYCIN 10 GRAM IV SOLR: 10 gram | INTRAVENOUS | Qty: 1750

## 2014-12-23 MED FILL — ZOLPIDEM 5 MG TAB: 5 mg | ORAL | Qty: 1

## 2014-12-23 MED FILL — ALBUTEROL SULFATE 0.083 % (0.83 MG/ML) SOLN FOR INHALATION: 2.5 mg /3 mL (0.083 %) | RESPIRATORY_TRACT | Qty: 1

## 2014-12-23 MED FILL — INVOKANA 100 MG TABLET: 100 mg | ORAL | Qty: 1

## 2014-12-23 MED FILL — IPRATROPIUM-ALBUTEROL 2.5 MG-0.5 MG/3 ML NEB SOLUTION: 2.5 mg-0.5 mg/3 ml | RESPIRATORY_TRACT | Qty: 3

## 2014-12-23 MED FILL — CLEOCIN 900 MG/50 ML IN 5 % DEXTROSE INTRAVENOUS PIGGYBACK: 900 mg/50 mL | INTRAVENOUS | Qty: 50

## 2014-12-23 MED FILL — INSULIN GLARGINE 100 UNIT/ML INJECTION: 100 unit/mL | SUBCUTANEOUS | Qty: 1

## 2014-12-23 MED FILL — VENTOLIN HFA 90 MCG/ACTUATION AEROSOL INHALER: 90 mcg/actuation | RESPIRATORY_TRACT | Qty: 8

## 2014-12-23 MED FILL — GLUCAGEN DIAGNOSTIC KIT 1 MG/ML INJECTION: 1 mg/mL | INTRAMUSCULAR | Qty: 1

## 2014-12-23 MED FILL — LOVENOX 40 MG/0.4 ML SUBCUTANEOUS SYRINGE: 40 mg/0.4 mL | SUBCUTANEOUS | Qty: 0.4

## 2014-12-23 MED FILL — DOXYCYCLINE HYCLATE 100 MG TAB: 100 mg | ORAL | Qty: 1

## 2014-12-23 MED FILL — GABAPENTIN 300 MG CAP: 300 mg | ORAL | Qty: 2

## 2014-12-23 MED FILL — LIPITOR 20 MG TABLET: 20 mg | ORAL | Qty: 4

## 2014-12-23 MED FILL — CITALOPRAM 20 MG TAB: 20 mg | ORAL | Qty: 1

## 2014-12-23 MED FILL — OXYCODONE-ACETAMINOPHEN 5 MG-325 MG TAB: 5-325 mg | ORAL | Qty: 1

## 2014-12-23 MED FILL — SODIUM CHLORIDE 0.9 % IV: INTRAVENOUS | Qty: 1000

## 2014-12-23 MED FILL — INSULIN LISPRO 100 UNIT/ML INJECTION: 100 unit/mL | SUBCUTANEOUS | Qty: 1

## 2014-12-23 MED FILL — CLONIDINE 0.1 MG TAB: 0.1 mg | ORAL | Qty: 3

## 2014-12-23 MED FILL — BREO ELLIPTA 200 MCG-25 MCG/DOSE POWDER FOR INHALATION: 200-25 mcg/dose | RESPIRATORY_TRACT | Qty: 28

## 2014-12-23 MED FILL — LEVAQUIN 750 MG TABLET: 750 mg | ORAL | Qty: 1

## 2014-12-23 NOTE — Progress Notes (Signed)
[facility]: MED RECONCILIATION    Comments/Recommendations:   Patient's significant other states,"his PCP told him to take Clonidine at night only if his blood pressure was high." She does not know what the stated blood pressure value should be to take the medication.    Medication(s) ADDED to PTA list:  1. none    Medication(s) REMOVED from PTA list:  1. none    Medication(s) ADJUSTED on PTA list:  1. Adderall frequency added as 20 mg TID  2. Novolog dose added as 20 units TID  3. Levemir dose and frequency changed to 15 units at bedtime  4. Alprazolam frequency changed to 2 mg 4 x daily as needed.    Information obtained from: Rx bottles (read by significant other)/ Rx query    Significant PMH/Disease States:   Past Medical History   Diagnosis Date   ??? COPD (chronic obstructive pulmonary disease) (HCC) 12/22/2014   ??? Diabetes (HCC)    ??? Heart attack (HCC)    ??? Hypertension      Chief Complaint for this Admission:   Chief Complaint   Patient presents with   ??? Facial Swelling         Allergies: Lodine [etodolac]    Prior to Admission Medications:     Medication Documentation Review Audit       Reviewed by Verner Mouldandice D Duncan, PHARMD (Pharmacist) on 12/23/14 at 1012         Medication Sig Documenting Provider Last Dose Status Taking?      albuterol (PROAIR HFA) 90 mcg/actuation inhaler Take 1 Puff by inhalation every four (4) hours as needed. Phys Other, MD 12/22/2014 Unknown time Active Yes    albuterol (PROVENTIL VENTOLIN) 2.5 mg /3 mL (0.083 %) nebulizer solution 2.5 mg by Nebulization route two (2) times a day. Historical Provider 12/21/2014 Unknown time Active Yes    ALPRAZolam (XANAX) 2 mg tablet Take 2 mg by mouth four (4) times daily as needed for Anxiety. Historical Provider  Active Yes    amLODIPine (NORVASC) 10 mg tablet Take 10 mg by mouth daily. Historical Provider 12/21/2014 Active Yes    amoxicillin (AMOXIL) 500 mg capsule Take 500 mg by mouth two (2) times a  day. X 7 days Phys Other, MD 12/22/2014 am Active Yes    atorvastatin (LIPITOR) 80 mg tablet Take 80 mg by mouth daily. Historical Provider 12/21/2014 Unknown time Active Yes    canagliflozin (INVOKANA) 100 mg tablet Take 100 mg by mouth Daily (before breakfast). Historical Provider 12/21/2014 Unknown time Active Yes    carvedilol (COREG) 12.5 mg tablet Take 12.5 mg by mouth two (2) times daily (with meals). Historical Provider 12/22/2014 Unknown time Active Yes             Med Note Para March(DUNCAN, CANDICE D   Sun Dec 23, 2014 10:10 AM):        citalopram (CELEXA) 20 mg tablet Take 20 mg by mouth daily. Historical Provider 12/21/2014 Unknown time Active Yes    cloNIDine HCl (CATAPRES) 0.3 mg tablet Take 0.3 mg by mouth nightly as needed. If BP is high Historical Provider 12/21/2014 Unknown time Active Yes    dextroamphetamine-amphetamine (ADDERALL) 20 mg tablet Take 20 mg by mouth three (3) times daily. Historical Provider  Active Yes    fluticasone-salmeterol (ADVAIR DISKUS) 500-50 mcg/dose diskus inhaler Take 1 Puff by inhalation every twelve (12) hours. Historical Provider 12/21/2014 Unknown time Active Yes    gabapentin (NEURONTIN) 600 mg tablet Take 600 mg by mouth  three (3) times daily. Historical Provider 12/21/2014 Unknown time Active Yes    insulin aspart (NOVOLOG FLEXPEN) 100 unit/mL inpn 20 Units by SubCUTAneous route three (3) times daily (after meals). Sliding scale (Patient reports he takes this after meals) Historical Provider 12/21/2014 Unknown time Active Yes    insulin detemir (LEVEMIR FLEXPEN) 100 unit/mL (3 mL) inpn 15 Units by SubCUTAneous route nightly. Historical Provider 12/21/2014 Unknown time Active Yes    meloxicam (MOBIC) 15 mg tablet Take 15 mg by mouth two (2) times a day. Historical Provider  Active Yes             Med Note Chrys Racer D   Sun Dec 23, 2014 10:12 AM):        metFORMIN (GLUCOPHAGE) 1,000 mg tablet Take 1,000 mg by mouth two (2)  times daily (with meals). Phys Other, MD 12/22/2014 Unknown time Active Yes    naloxone (NARCAN) 4 mg/actuation spry 4 mg by Nasal route as needed (oversedation with narcotic pain medicine). Historical Provider  Active Yes    oxyCODONE IR (ROXICODONE) 10 mg tab immediate release tablet Take 10 mg by mouth every eight (8) hours as needed for Pain. Indications: PAIN, rheumatoid arthritis Historical Provider 12/21/2014 Unknown time Active Yes    pantoprazole (PROTONIX) 40 mg tablet Take 40 mg by mouth Before breakfast and dinner. Historical Provider 12/21/2014 Unknown time Active Yes    tiotropium (SPIRIVA WITH HANDIHALER) 18 mcg inhalation capsule Take 1 Cap by inhalation daily. Historical Provider 12/21/2014 Unknown time Active Yes                        Verner Mould, [place]   Contact: [phone]

## 2014-12-23 NOTE — Discharge Summary (Signed)
Physician Discharge Summary     Patient ID:  Joseph Farmer  [id number]  54 y.o.  07/31/1960    Admit date: 12/22/2014    Discharge date and time: 12/23/2014    Admission Diagnoses: Facial cellulitis    Discharge Diagnoses:    Principal Diagnosis   Facial cellulitis                                               Other Diagnoses  Principal Problem:    Facial cellulitis (12/22/2014)    Active Problems:    HTN (hypertension) (12/22/2014)      CAD (coronary artery disease) (12/22/2014)      DM (diabetes mellitus) (HCC) (12/22/2014)      SIRS (systemic inflammatory response syndrome) (HCC) (12/22/2014)      Elevated serum creatinine (12/22/2014)      COPD (chronic obstructive pulmonary disease) (HCC) (12/22/2014)         Hospital Course:     Principal Problem:  Facial cellulitis (12/22/2014)  - wound culture and MRSA screen obtained, pending at time of discharge but purulence supports MRSA  - clindamycin alone likely to be insufficient. Received 2 doses of vancomycin with marked improvement  - given superficial purulence I doubt this oral or odontogenic and so clindamycin is unlikely to add any significant coverage to the likely pathogen   - At this point, doxycycline is likely best agent to add MRSA coverage and we understand his constraints about [place]. Given his very uncontrolled DMII, we will also add levofloxacin for extended GNR coverage. We will discharge him home to have follow-up tomorrow with his primary care physician.  ??  Active Problems:  HTN (hypertension) (12/22/2014)  - BP elevated, likely due to pain  - continue home BP meds  ??  CAD (coronary artery disease) (12/22/2014)  - stable, continue cardiac meds  ??  DM (diabetes mellitus) (HCC) (12/22/2014), very uncontrolled  - type 2, no complications  - HgbA1c is 12.3, avg BG over 300  -- Continue home regimen    SIRS (systemic inflammatory response syndrome) (HCC) (12/22/2014)  - POA, due to acute infection   - does not appear to be septic at this time  ??  Elevated serum creatinine (12/22/2014)  - no history of CKD per patient  - may have ARF, does appear volume depleted. Improved with hydration  ??  COPD  - clear lung exam now, unclear if [facility], he reports he got nebs because he was having difficulty breathing in a hot room  - continue inhaled meds    PCP: Phys Other, MD    Consults: None    Significant Diagnostic Studies: See Hospital Course    Discharged home in improved condition.    Discharge Exam:    Visit Vitals   ??? BP (!) 165/94   ??? Pulse (!) 113   ??? Temp 98.3 ??F (36.8 ??C)   ??? Resp 10   ??? Ht 5\' 4"  (1.626 m)   ??? Wt 114 kg (251 lb 5.2 oz)   ??? SpO2 (!) 88%   ??? BMI 43.14 kg/m2     Physical Exam:    Gen: Well-developed, well-nourished, in no acute distress  HEENT:  Pink conjunctivae, hearing intact to voice, moist mucous membranes, left cheek over zygomatic arch with punctate site of purulence  and surrounding erythema and induration.  Neck: Supple  Resp: No accessory muscle use, clear breath sounds without wheezes rales or rhonchi  Card: No murmurs, normal S1, S2 without thrills or peripheral edema  Abd:  Soft, non-tender, non-distended, normoactive bowel sounds are present  Musc: No cyanosis or clubbing  Skin: No rashes or ulcers, skin turgor is good  Neuro:  Cranial nerves are grossly intact, tongue is midline, speech is fluent, follows commands appropriately  Psych:  Good insight, oriented to person, place and time, alert        Disposition: home    Patient Instructions:   Current Discharge Medication List      START taking these medications    Details   doxycycline (MONODOX) 100 mg capsule Take 1 Cap by mouth two (2) times a day for 10 days.  Qty: 20 Cap, Refills: 0         CONTINUE these medications which have NOT CHANGED    Details   metFORMIN (GLUCOPHAGE) 1,000 mg tablet Take 1,000 mg by mouth two (2) times daily (with meals).       albuterol (PROAIR HFA) 90 mcg/actuation inhaler Take 1 Puff by inhalation every four (4) hours as needed.      albuterol (PROVENTIL VENTOLIN) 2.5 mg /3 mL (0.083 %) nebulizer solution 2.5 mg by Nebulization route two (2) times a day.      amLODIPine (NORVASC) 10 mg tablet Take 10 mg by mouth daily.      atorvastatin (LIPITOR) 80 mg tablet Take 80 mg by mouth daily.      canagliflozin (INVOKANA) 100 mg tablet Take 100 mg by mouth Daily (before breakfast).      carvedilol (COREG) 12.5 mg tablet Take 12.5 mg by mouth two (2) times daily (with meals).      citalopram (CELEXA) 20 mg tablet Take 20 mg by mouth daily.      cloNIDine HCl (CATAPRES) 0.3 mg tablet Take 0.3 mg by mouth two (2) times a day.      fluticasone-salmeterol (ADVAIR DISKUS) 500-50 mcg/dose diskus inhaler Take 1 Puff by inhalation every twelve (12) hours.      gabapentin (NEURONTIN) 600 mg tablet Take 600 mg by mouth three (3) times daily.      insulin aspart (NOVOLOG FLEXPEN) 100 unit/mL inpn by SubCUTAneous route three (3) times daily (after meals). Sliding scale (Patient reports he takes this after meals)      insulin detemir (LEVEMIR FLEXPEN) 100 unit/mL (3 mL) inpn 10 Units by SubCUTAneous route daily.      meloxicam (MOBIC) 15 mg tablet Take 15 mg by mouth.      naloxone (NARCAN) 4 mg/actuation spry 4 mg by Nasal route as needed (oversedation with narcotic pain medicine).      oxyCODONE IR (ROXICODONE) 10 mg tab immediate release tablet Take 10 mg by mouth every eight (8) hours as needed for Pain. Indications: PAIN, rheumatoid arthritis      pantoprazole (PROTONIX) 40 mg tablet Take 40 mg by mouth Before breakfast and dinner.      tiotropium (SPIRIVA WITH HANDIHALER) 18 mcg inhalation capsule Take 1 Cap by inhalation daily.      ALPRAZolam (XANAX) 2 mg tablet Take 2 mg by mouth four (4) times daily as needed for Anxiety.      dextroamphetamine-amphetamine (ADDERALL) 20 mg tablet    Levofloxacin 750 mg tablet Take 20 mg by mouth.       Take 1 tablet by mouth x 10 days  STOP taking these medications       amoxicillin (AMOXIL) 500 mg capsule Comments:   Reason for Stopping:             Activity: Activity as tolerated  Diet: Diabetic Diet  Wound Care: Keep wound clean and dry and follow-up with PCP tomorrow    Follow-up with PCP in 1 day.  Follow-up tests/labs Culture data    Signed:  Duane Boston, DO  12/23/2014  10:10 AM    Greater than 30 minutes was spent in the coordination, counseling and execution of this discharge

## 2014-12-23 NOTE — Progress Notes (Addendum)
0820  Provided wound care to patient.  Patient states pain is tolerable now, he doesn't want pain medicine.  Patient stated, "I just want [place]      1227  Patient's vancomycin complete.  PO levaquin and doxycicline given.  Tele leads removed.  Instructed patient that he could go ahead and get dressed while discharge paperwork is being printed.        1315  Patient's family have arrived.  Everyone acknowledges the importance of filling the written prescriptions and following up with his PCP.

## 2014-12-24 LAB — CULTURE, MRSA

## 2014-12-25 LAB — CULTURE, WOUND W GRAM STAIN

## 2015-01-13 DIAGNOSIS — I219 Acute myocardial infarction, unspecified: Secondary | ICD-10-CM

## 2015-01-13 HISTORY — DX: Acute myocardial infarction, unspecified: I21.9

## 2015-01-23 ENCOUNTER — Ambulatory Visit (INDEPENDENT_AMBULATORY_CARE_PROVIDER_SITE_OTHER): Payer: Medicare Other | Admitting: Cardiovascular Disease

## 2015-01-23 ENCOUNTER — Encounter: Payer: Self-pay | Admitting: Cardiovascular Disease

## 2015-01-23 VITALS — BP 126/98 | HR 108 | Ht 69.5 in | Wt 249.0 lb

## 2015-01-23 DIAGNOSIS — Z951 Presence of aortocoronary bypass graft: Secondary | ICD-10-CM

## 2015-01-23 DIAGNOSIS — I25768 Atherosclerosis of bypass graft of coronary artery of transplanted heart with other forms of angina pectoris: Secondary | ICD-10-CM | POA: Diagnosis not present

## 2015-01-23 DIAGNOSIS — I10 Essential (primary) hypertension: Secondary | ICD-10-CM

## 2015-01-23 DIAGNOSIS — I5022 Chronic systolic (congestive) heart failure: Secondary | ICD-10-CM | POA: Diagnosis not present

## 2015-01-23 DIAGNOSIS — I429 Cardiomyopathy, unspecified: Secondary | ICD-10-CM | POA: Diagnosis not present

## 2015-01-23 DIAGNOSIS — J438 Other emphysema: Secondary | ICD-10-CM

## 2015-01-23 DIAGNOSIS — R0600 Dyspnea, unspecified: Secondary | ICD-10-CM

## 2015-01-23 DIAGNOSIS — R000 Tachycardia, unspecified: Secondary | ICD-10-CM

## 2015-01-23 DIAGNOSIS — R0609 Other forms of dyspnea: Secondary | ICD-10-CM

## 2015-01-23 DIAGNOSIS — I519 Heart disease, unspecified: Secondary | ICD-10-CM

## 2015-01-23 MED ORDER — CARVEDILOL 25 MG PO TABS: 25.0000 mg | ORAL_TABLET | Freq: Two times a day (BID) | ORAL | Status: DC

## 2015-01-23 MED ORDER — SACUBITRIL-VALSARTAN 97-103 MG PO TABS: 1.0000 | ORAL_TABLET | Freq: Two times a day (BID) | ORAL | Status: DC

## 2015-01-23 MED ORDER — NITROGLYCERIN 0.4 MG SL SUBL: 0.4000 mg | SUBLINGUAL_TABLET | SUBLINGUAL | Status: AC | PRN

## 2015-01-23 NOTE — Patient Instructions (Signed)
Medication Instructions:  Increase coreg TO 25 MG TWO TIMES DAILY INCREASE ENTRESTO TO 97/103 MG TWO TIMES DAILY START NITRO (INSTRUCTION SHEET GIVEN)  Labwork: NONE  Testing/Procedures: Your physician has requested that you have an echocardiogram. Echocardiography is a painless test that uses sound waves to create images of your heart. It provides your doctor with information about the size and shape of your heart and how well your heart's chambers and valves are working. This procedure takes approximately one hour. There are no restrictions for this procedure.    Follow-Up: Your physician recommends that you schedule a follow-up appointment in: DR. Lovena Le IN ONE MONTH     Any Other Special Instructions Will Be Listed Below (If Applicable).      If you need a refill on your cardiac medications before your next appointment, please call your pharmacy.

## 2015-01-23 NOTE — Progress Notes (Signed)
Patient ID: Manuel Knight, male   DOB: Aug 13, 1960, 55 y.o.   MRN: [id number]      SUBJECTIVE: The patient is a 55 yr old male with diabetes, hyperlipidemia, hypertension, COPD, and CAD. He sustained a non-ST elevation myocardial infarction and ultimately underwent 3-vessel coronary artery bypass graft surgery on 02/06/13, with a LIMA to the LAD, SVG to OM1, and SVG to RCA. Left ventriculography revealed an EF of 30% and LVEDP was 20 mmHg.  Echocardiogram on 11/15/13 showed severely reduced LV systolic function, EF [id number], restrictive diastolic physiology, RV dysfunction, mild to moderate mitral regurgitation.  He saw Dr. Lovena Le on 03/02/14 and was supposed to have an echocardiogram repeated but I cannot locate one.  Referred to see me today for elevated HR, noted by pain management physician.  ECG shows sinus tachycardia (HR 104 bpm) with LVH, nonspecific IVCD, and repolarization abnormalities.  Says he has chest pain on occasion but does not have any SL nitroglycerin. Also has COPD and exertional dyspnea and inhalers did not alleviate symptoms this morning. Denies leg swelling.  Says he is very stressed as he and his wife are raising their 26 yr old grandson and they may lose custody.  Review of Systems: As per "subjective", otherwise negative.  Allergies  Allergen Reactions  . Tylenol [Acetaminophen] Hives  . Lodine [Etodolac] Swelling and Rash    Rash and swelling   . Propoxyphene N-Acetaminophen Other (See Comments)    unknown    Current Outpatient Prescriptions  Medication Sig Dispense Refill  . alprazolam (XANAX) 2 MG tablet Take 1 tablet by mouth 4 (four) times daily.    Marland Kitchen amLODipine (NORVASC) 10 MG tablet Take 10 mg by mouth daily.    Marland Kitchen amphetamine-dextroamphetamine (ADDERALL) 20 MG tablet Take 60 mg by mouth daily.    Marland Kitchen aspirin EC 81 MG tablet Take 1 tablet (81 mg total) by mouth daily.    Marland Kitchen atorvastatin (LIPITOR) 80 MG tablet Take 1 tablet by mouth daily.    . Canagliflozin  (INVOKANA) 100 MG TABS Take 100 mg by mouth daily.    . carvedilol (COREG) 12.5 MG tablet TAKE ONE TABLET BY MOUTH TWICE DAILY. 60 tablet 0  . citalopram (CELEXA) 20 MG tablet Take 1 tablet by mouth daily.    . cloNIDine (CATAPRES) 0.3 MG tablet Take 1 tablet by mouth 2 (two) times daily.    Marland Kitchen ENTRESTO 49-51 MG TAKE ONE TABLET BY MOUTH TWICE DAILY 60 tablet 0  . Fluticasone-Salmeterol (ADVAIR DISKUS) 500-50 MCG/DOSE AEPB Inhale 1 puff into the lungs every 12 (twelve) hours.      . insulin aspart (NOVOLOG FLEXPEN) 100 UNIT/ML FlexPen Inject 0-26 Units into the skin 3 (three) times daily with meals. 20 units three times a day with sliding scale max of 26 units    . metFORMIN (GLUCOPHAGE) 1000 MG tablet Take 1,000 mg by mouth 2 (two) times daily with a meal.    . Oxycodone HCl 10 MG TABS Take 1 tablet by mouth 2 (two) times daily.    . pantoprazole (PROTONIX) 40 MG tablet TAKE ONE TABLET BY MOUTH TWICE DAILY 60 tablet 3  . PROAIR HFA 108 (90 BASE) MCG/ACT inhaler Inhale 2 puffs into the lungs every 4 (four) hours as needed.    . tiotropium (SPIRIVA) 18 MCG inhalation capsule Place 18 mcg into inhaler and inhale daily.       No current facility-administered medications for this visit.    Past Medical History  Diagnosis Date  . Anxiety   .  Pulmonary nodule   . Hyperlipidemia   . COPD (chronic obstructive pulmonary disease) ([place])   . Chronic back pain   . Obesity   . Diabetes mellitus, type 2 ([place])   . Hypertension   . Cellulitis 2011    of neck   . GERD (gastroesophageal reflux disease)   . Claustrophobia   . Diverticulitis of colon [id number]    complicated by abscess  . Heart attack [facility])     Past Surgical History  Procedure Laterality Date  . Appendectomy  2007    gangrenous appendicitis   . Tonsillectomy    . Coronary artery bypass graft N/A 02/06/2013    Procedure: CORONARY ARTERY BYPASS GRAFTING (CABG) with TEE;  Surgeon: [name], MD;  Location: [place];  Service: Open Heart  Surgery;  Laterality: N/A;  CABG x three,  using left internal mammary artery and right leg greater saphenous vein harvested endoscopically  . Left heart catheterization with coronary angiogram N/A 02/03/2013    Procedure: LEFT HEART CATHETERIZATION WITH CORONARY ANGIOGRAM;  Surgeon: Jettie Booze, MD;  Location: [facility] CATH LAB;  Service: Cardiovascular;  Laterality: N/A;    Social History   Social History  . Marital Status: Single    Spouse Name: N/A  . Number of Children: 2tw  . Years of Education: N/A   Occupational History  . Disabled    Social History Main Topics  . Smoking status: Former Smoker -- 1.50 packs/day for 19 years    Types: Cigarettes    Start date: 03/12/1967    Quit date: 01/13/1988  . Smokeless tobacco: Never Used  . Alcohol Use: No     Comment:    . Drug Use: No     Comment: Formerly used marijuana for the pain, denies now.  . Sexual Activity: Not on file   Other Topics Concern  . Not on file   Social History Narrative   Lives with significant other of 18 years. They are raising his grandson.     Filed Vitals:   01/23/15 1555  BP: 126/98  Pulse: 108  Height: 5' 9.5" (1.765 m)  Weight: 249 lb (112.946 kg)  SpO2: 94%    PHYSICAL EXAM General: NAD HEENT: Normal. Neck: No JVD, no thyromegaly. Lungs: Diminished throughout, no rales/wheezes. CV: Tachycardic, regular rhythm, normal S1/S2, no S3/S4, no murmur. No pretibial or periankle edema.    Abdomen: Soft, non-distended, obese.  Neurologic: Alert and oriented x 3.  Psych: Normal affect. Skin: Normal. Musculoskeletal: No gross deformities. Extremities: No clubbing or cyanosis.   ECG: Most recent ECG reviewed.      ASSESSMENT AND PLAN: 1. CAD s/p 3-vessel CABG: Symptomatically stable. Will prescribe SL nitroglycerin. Continue ASA, statin, Entresto (increase to 97-103 mg bid), and Coreg (increase to 25 mg bid).   2. Ischemic cardiomyopathy, EF [id number] systolic heart failure: No  evidence of heart failure. Follows with Dr. Lovena Le. Will obtain repeat echocardiogram to see if he meets criteria for ICD implantation.  Continue Entresto with increase to 97/103 mg bid (target dose) and increase Coreg to 25 mg bid.  3. Essential HTN: Mildly elevated diastolic BP. Increase Entresto as noted above to target dose and Coreg to 25 mg bid.  4. Hyperlipidemia: Continue high dose Lipitor 80 mg daily.   Dispo: f/u with Dr. Lovena Le in March. Follow up with me 4 months thereafter.  Kate Sable, M.D., F.A.C.C.

## 2015-01-29 ENCOUNTER — Ambulatory Visit (HOSPITAL_COMMUNITY): Payer: Medicare Other | Attending: Cardiovascular Disease

## 2015-03-12 ENCOUNTER — Emergency Department (HOSPITAL_COMMUNITY): Payer: Medicare Other

## 2015-03-12 ENCOUNTER — Inpatient Hospital Stay (HOSPITAL_COMMUNITY)
Admission: EM | Admit: 2015-03-12 | Discharge: 2015-03-15 | DRG: 728 | Disposition: A | Discharge status: Home / Self Care | Payer: Medicare Other | Attending: Internal Medicine | Admitting: Internal Medicine

## 2015-03-12 ENCOUNTER — Encounter (HOSPITAL_COMMUNITY): Payer: Self-pay | Admitting: Emergency Medicine

## 2015-03-12 DIAGNOSIS — G894 Chronic pain syndrome: Secondary | ICD-10-CM | POA: Diagnosis present

## 2015-03-12 DIAGNOSIS — L03221 Cellulitis of neck: Secondary | ICD-10-CM

## 2015-03-12 DIAGNOSIS — Z951 Presence of aortocoronary bypass graft: Secondary | ICD-10-CM

## 2015-03-12 DIAGNOSIS — Z794 Long term (current) use of insulin: Secondary | ICD-10-CM | POA: Diagnosis not present

## 2015-03-12 DIAGNOSIS — E1165 Type 2 diabetes mellitus with hyperglycemia: Secondary | ICD-10-CM

## 2015-03-12 DIAGNOSIS — I10 Essential (primary) hypertension: Secondary | ICD-10-CM

## 2015-03-12 DIAGNOSIS — Z809 Family history of malignant neoplasm, unspecified: Secondary | ICD-10-CM

## 2015-03-12 DIAGNOSIS — F329 Major depressive disorder, single episode, unspecified: Secondary | ICD-10-CM | POA: Diagnosis present

## 2015-03-12 DIAGNOSIS — R1310 Dysphagia, unspecified: Secondary | ICD-10-CM

## 2015-03-12 DIAGNOSIS — I2510 Atherosclerotic heart disease of native coronary artery without angina pectoris: Secondary | ICD-10-CM

## 2015-03-12 DIAGNOSIS — Z8249 Family history of ischemic heart disease and other diseases of the circulatory system: Secondary | ICD-10-CM | POA: Diagnosis not present

## 2015-03-12 DIAGNOSIS — Z6835 Body mass index (BMI) 35.0-35.9, adult: Secondary | ICD-10-CM

## 2015-03-12 DIAGNOSIS — E872 Acidosis: Secondary | ICD-10-CM | POA: Diagnosis present

## 2015-03-12 DIAGNOSIS — J439 Emphysema, unspecified: Secondary | ICD-10-CM | POA: Diagnosis not present

## 2015-03-12 DIAGNOSIS — Z87891 Personal history of nicotine dependence: Secondary | ICD-10-CM

## 2015-03-12 DIAGNOSIS — F909 Attention-deficit hyperactivity disorder, unspecified type: Secondary | ICD-10-CM | POA: Diagnosis present

## 2015-03-12 DIAGNOSIS — K529 Noninfective gastroenteritis and colitis, unspecified: Secondary | ICD-10-CM

## 2015-03-12 DIAGNOSIS — F411 Generalized anxiety disorder: Secondary | ICD-10-CM

## 2015-03-12 DIAGNOSIS — I252 Old myocardial infarction: Secondary | ICD-10-CM

## 2015-03-12 DIAGNOSIS — E669 Obesity, unspecified: Secondary | ICD-10-CM | POA: Diagnosis present

## 2015-03-12 DIAGNOSIS — R209 Unspecified disturbances of skin sensation: Secondary | ICD-10-CM

## 2015-03-12 DIAGNOSIS — F40240 Claustrophobia: Secondary | ICD-10-CM | POA: Diagnosis present

## 2015-03-12 DIAGNOSIS — L049 Acute lymphadenitis, unspecified: Secondary | ICD-10-CM

## 2015-03-12 DIAGNOSIS — Z79891 Long term (current) use of opiate analgesic: Secondary | ICD-10-CM | POA: Diagnosis not present

## 2015-03-12 DIAGNOSIS — K219 Gastro-esophageal reflux disease without esophagitis: Secondary | ICD-10-CM

## 2015-03-12 DIAGNOSIS — Z7982 Long term (current) use of aspirin: Secondary | ICD-10-CM | POA: Diagnosis not present

## 2015-03-12 DIAGNOSIS — I110 Hypertensive heart disease with heart failure: Secondary | ICD-10-CM | POA: Diagnosis present

## 2015-03-12 DIAGNOSIS — E785 Hyperlipidemia, unspecified: Secondary | ICD-10-CM | POA: Diagnosis present

## 2015-03-12 DIAGNOSIS — J449 Chronic obstructive pulmonary disease, unspecified: Secondary | ICD-10-CM | POA: Diagnosis present

## 2015-03-12 DIAGNOSIS — N492 Inflammatory disorders of scrotum: Secondary | ICD-10-CM | POA: Diagnosis present

## 2015-03-12 DIAGNOSIS — N5089 Other specified disorders of the male genital organs: Secondary | ICD-10-CM | POA: Diagnosis present

## 2015-03-12 DIAGNOSIS — R1319 Other dysphagia: Secondary | ICD-10-CM

## 2015-03-12 DIAGNOSIS — I5022 Chronic systolic (congestive) heart failure: Secondary | ICD-10-CM | POA: Diagnosis present

## 2015-03-12 DIAGNOSIS — I214 Non-ST elevation (NSTEMI) myocardial infarction: Secondary | ICD-10-CM

## 2015-03-12 DIAGNOSIS — L0211 Cutaneous abscess of neck: Secondary | ICD-10-CM

## 2015-03-12 DIAGNOSIS — J438 Other emphysema: Secondary | ICD-10-CM | POA: Diagnosis not present

## 2015-03-12 DIAGNOSIS — N498 Inflammatory disorders of other specified male genital organs: Secondary | ICD-10-CM

## 2015-03-12 DIAGNOSIS — Z833 Family history of diabetes mellitus: Secondary | ICD-10-CM

## 2015-03-12 DIAGNOSIS — L0291 Cutaneous abscess, unspecified: Secondary | ICD-10-CM

## 2015-03-12 LAB — HEPATIC FUNCTION PANEL
ALK PHOS: 61 U/L (ref 38–126)
ALT: 18 U/L (ref 17–63)
AST: 16 U/L (ref 15–41)
Albumin: 3 g/dL — ABNORMAL LOW (ref 3.5–5.0)
BILIRUBIN DIRECT: 0.1 mg/dL (ref 0.1–0.5)
BILIRUBIN INDIRECT: 0.4 mg/dL (ref 0.3–0.9)
Total Bilirubin: 0.5 mg/dL (ref 0.3–1.2)
Total Protein: 6.2 g/dL — ABNORMAL LOW (ref 6.5–8.1)

## 2015-03-12 LAB — CBC WITH DIFFERENTIAL/PLATELET
BASOS PCT: 1 %
Basophils Absolute: 0.1 10*3/uL (ref 0.0–0.1)
EOS ABS: 0.2 10*3/uL (ref 0.0–0.7)
EOS PCT: 1 %
HCT: 44.7 % (ref 39.0–52.0)
HEMOGLOBIN: 15.2 g/dL (ref 13.0–17.0)
LYMPHS ABS: 1.8 10*3/uL (ref 0.7–4.0)
Lymphocytes Relative: 14 %
MCH: 30.5 pg (ref 26.0–34.0)
MCHC: 34 g/dL (ref 30.0–36.0)
MCV: 89.8 fL (ref 78.0–100.0)
Monocytes Absolute: 1.5 10*3/uL — ABNORMAL HIGH (ref 0.1–1.0)
Monocytes Relative: 12 %
NEUTROS PCT: 72 %
Neutro Abs: 9 10*3/uL — ABNORMAL HIGH (ref 1.7–7.7)
PLATELETS: 278 10*3/uL (ref 150–400)
RBC: 4.98 MIL/uL (ref 4.22–5.81)
RDW: 13.4 % (ref 11.5–15.5)
WBC: 12.5 10*3/uL — ABNORMAL HIGH (ref 4.0–10.5)

## 2015-03-12 LAB — CBG MONITORING, ED
GLUCOSE-CAPILLARY: 444 mg/dL — AB (ref 65–99)
GLUCOSE-CAPILLARY: 322 mg/dL — AB (ref 65–99)
Glucose-Capillary: 506 mg/dL — ABNORMAL HIGH (ref 65–99)

## 2015-03-12 LAB — URINALYSIS, ROUTINE W REFLEX MICROSCOPIC
BILIRUBIN URINE: NEGATIVE
Glucose, UA: >1000 mg/dL — AB
KETONES UR: NEGATIVE mg/dL
Leukocytes, UA: NEGATIVE
NITRITE: NEGATIVE
Protein, ur: NEGATIVE mg/dL
Specific Gravity, Urine: 1.01 (ref 1.005–1.030)
pH: 5.5 (ref 5.0–8.0)

## 2015-03-12 LAB — BASIC METABOLIC PANEL
Anion gap: 12 (ref 5–15)
BUN: 18 mg/dL (ref 6–20)
CHLORIDE: 96 mmol/L — AB (ref 101–111)
CO2: 25 mmol/L (ref 22–32)
CREATININE: 0.88 mg/dL (ref 0.61–1.24)
Calcium: 9 mg/dL (ref 8.9–10.3)
Glucose, Bld: 551 mg/dL (ref 65–99)
Potassium: 4.1 mmol/L (ref 3.5–5.1)
SODIUM: 133 mmol/L — AB (ref 135–145)

## 2015-03-12 LAB — GLUCOSE, CAPILLARY
Glucose-Capillary: 309 mg/dL — ABNORMAL HIGH (ref 65–99)
Glucose-Capillary: 361 mg/dL — ABNORMAL HIGH (ref 65–99)

## 2015-03-12 LAB — URINE MICROSCOPIC-ADD ON
Squamous Epithelial / LPF: NONE SEEN
WBC UA: NONE SEEN WBC/hpf (ref 0–5)

## 2015-03-12 LAB — MAGNESIUM: Magnesium: 1.4 mg/dL — ABNORMAL LOW (ref 1.7–2.4)

## 2015-03-12 LAB — PHOSPHORUS: Phosphorus: 3.2 mg/dL (ref 2.5–4.6)

## 2015-03-12 LAB — LACTIC ACID, PLASMA
LACTIC ACID, VENOUS: 2.1 mmol/L — AB (ref 0.5–2.0)
LACTIC ACID, VENOUS: 1.3 mmol/L (ref 0.5–2.0)

## 2015-03-12 MED ORDER — SODIUM CHLORIDE 0.9 % IV SOLN: 1000.0000 mL | Freq: Once | INTRAVENOUS | Status: DC

## 2015-03-12 MED ORDER — PIPERACILLIN-TAZOBACTAM 3.375 G IVPB
3.3750 g | Freq: Three times a day (TID) | INTRAVENOUS | Status: DC
  Administered 2015-03-12 – 2015-03-15 (×8): 3.375 g via INTRAVENOUS
  Filled 2015-03-12 (×7): qty 50

## 2015-03-12 MED ORDER — ONDANSETRON HCL 4 MG PO TABS: 4.0000 mg | ORAL_TABLET | Freq: Four times a day (QID) | ORAL | Status: DC | PRN

## 2015-03-12 MED ORDER — INSULIN ASPART 100 UNIT/ML ~~LOC~~ SOLN
10.0000 [IU] | Freq: Once | SUBCUTANEOUS | Status: AC
  Administered 2015-03-12: 10 [IU] via INTRAVENOUS
  Filled 2015-03-12: qty 1

## 2015-03-12 MED ORDER — CITALOPRAM HYDROBROMIDE 20 MG PO TABS
20.0000 mg | ORAL_TABLET | Freq: Every day | ORAL | Status: DC
  Administered 2015-03-12 – 2015-03-14 (×3): 20 mg via ORAL
  Filled 2015-03-12 (×3): qty 1

## 2015-03-12 MED ORDER — IOHEXOL 300 MG/ML  SOLN
100.0000 mL | Freq: Once | INTRAMUSCULAR | Status: AC | PRN
  Administered 2015-03-12: 100 mL via INTRAVENOUS

## 2015-03-12 MED ORDER — ONDANSETRON HCL 4 MG/2ML IJ SOLN
4.0000 mg | Freq: Four times a day (QID) | INTRAMUSCULAR | Status: DC | PRN
  Administered 2015-03-12: 4 mg via INTRAVENOUS
  Filled 2015-03-12: qty 2

## 2015-03-12 MED ORDER — SACUBITRIL-VALSARTAN 97-103 MG PO TABS
1.0000 | ORAL_TABLET | Freq: Two times a day (BID) | ORAL | Status: DC
  Administered 2015-03-13 – 2015-03-14 (×3): 1 via ORAL
  Filled 2015-03-12 (×12): qty 1

## 2015-03-12 MED ORDER — SODIUM CHLORIDE 0.9 % IV SOLN
Freq: Once | INTRAVENOUS | Status: AC
  Administered 2015-03-12: 10:00:00 via INTRAVENOUS

## 2015-03-12 MED ORDER — CLONIDINE HCL 0.2 MG PO TABS
0.3000 mg | ORAL_TABLET | Freq: Two times a day (BID) | ORAL | Status: DC
  Administered 2015-03-12: 0.3 mg via ORAL
  Filled 2015-03-12: qty 1

## 2015-03-12 MED ORDER — CLONIDINE HCL 0.1 MG PO TABS: 0.1000 mg | ORAL_TABLET | Freq: Two times a day (BID) | ORAL | Status: DC

## 2015-03-12 MED ORDER — TIOTROPIUM BROMIDE MONOHYDRATE 18 MCG IN CAPS
18.0000 ug | ORAL_CAPSULE | Freq: Every day | RESPIRATORY_TRACT | Status: DC
  Administered 2015-03-13 – 2015-03-15 (×3): 18 ug via RESPIRATORY_TRACT
  Filled 2015-03-12: qty 5

## 2015-03-12 MED ORDER — HEPARIN SODIUM (PORCINE) 5000 UNIT/ML IJ SOLN
5000.0000 [IU] | Freq: Three times a day (TID) | INTRAMUSCULAR | Status: DC
Start: 2015-03-12 — End: 2015-03-15
  Administered 2015-03-12 – 2015-03-15 (×8): 5000 [IU] via SUBCUTANEOUS
  Filled 2015-03-12 (×7): qty 1

## 2015-03-12 MED ORDER — ONDANSETRON HCL 4 MG/2ML IJ SOLN
4.0000 mg | Freq: Once | INTRAMUSCULAR | Status: AC
  Administered 2015-03-12: 4 mg via INTRAVENOUS
  Filled 2015-03-12: qty 2

## 2015-03-12 MED ORDER — ALPRAZOLAM 1 MG PO TABS
2.0000 mg | ORAL_TABLET | Freq: Four times a day (QID) | ORAL | Status: DC
  Administered 2015-03-12 – 2015-03-14 (×10): 2 mg via ORAL
  Filled 2015-03-12 (×10): qty 2

## 2015-03-12 MED ORDER — CARVEDILOL 12.5 MG PO TABS
25.0000 mg | ORAL_TABLET | Freq: Two times a day (BID) | ORAL | Status: DC
  Administered 2015-03-12 – 2015-03-15 (×5): 25 mg via ORAL
  Filled 2015-03-12 (×5): qty 2

## 2015-03-12 MED ORDER — HYDROMORPHONE HCL 1 MG/ML IJ SOLN
1.0000 mg | Freq: Once | INTRAMUSCULAR | Status: AC
  Administered 2015-03-12: 1 mg via INTRAVENOUS
  Filled 2015-03-12: qty 1

## 2015-03-12 MED ORDER — AMPHETAMINE-DEXTROAMPHETAMINE 10 MG PO TABS
60.0000 mg | ORAL_TABLET | Freq: Every day | ORAL | Status: DC
Start: 2015-03-13 — End: 2015-03-15
  Administered 2015-03-13 – 2015-03-14 (×2): 60 mg via ORAL
  Filled 2015-03-12 (×2): qty 6

## 2015-03-12 MED ORDER — MORPHINE SULFATE (PF) 2 MG/ML IV SOLN
2.0000 mg | INTRAVENOUS | Status: DC | PRN
  Administered 2015-03-12 – 2015-03-14 (×4): 2 mg via INTRAVENOUS
  Filled 2015-03-12 (×5): qty 1

## 2015-03-12 MED ORDER — ASPIRIN EC 81 MG PO TBEC
81.0000 mg | DELAYED_RELEASE_TABLET | Freq: Every day | ORAL | Status: DC
  Administered 2015-03-12 – 2015-03-14 (×3): 81 mg via ORAL
  Filled 2015-03-12 (×3): qty 1

## 2015-03-12 MED ORDER — INSULIN ASPART 100 UNIT/ML ~~LOC~~ SOLN
0.0000 [IU] | Freq: Three times a day (TID) | SUBCUTANEOUS | Status: DC
  Administered 2015-03-13: 11 [IU] via SUBCUTANEOUS
  Administered 2015-03-13: 8 [IU] via SUBCUTANEOUS
  Administered 2015-03-14: 3 [IU] via SUBCUTANEOUS
  Administered 2015-03-14: 2 [IU] via SUBCUTANEOUS
  Administered 2015-03-14: 5 [IU] via SUBCUTANEOUS
  Administered 2015-03-15: 2 [IU] via SUBCUTANEOUS

## 2015-03-12 MED ORDER — BUDESONIDE 0.25 MG/2ML IN SUSP
0.2500 mg | Freq: Two times a day (BID) | RESPIRATORY_TRACT | Status: DC
  Administered 2015-03-12 – 2015-03-15 (×5): 0.25 mg via RESPIRATORY_TRACT
  Filled 2015-03-12 (×5): qty 2

## 2015-03-12 MED ORDER — INSULIN DETEMIR 100 UNIT/ML ~~LOC~~ SOLN
10.0000 [IU] | Freq: Every day | SUBCUTANEOUS | Status: DC
  Administered 2015-03-12: 10 [IU] via SUBCUTANEOUS
  Filled 2015-03-12 (×2): qty 0.1

## 2015-03-12 MED ORDER — ACETAMINOPHEN 325 MG PO TABS: 650.0000 mg | ORAL_TABLET | Freq: Four times a day (QID) | ORAL | Status: DC | PRN

## 2015-03-12 MED ORDER — DIATRIZOATE MEGLUMINE & SODIUM 66-10 % PO SOLN
ORAL | Status: AC
  Filled 2015-03-12: qty 30

## 2015-03-12 MED ORDER — ACETAMINOPHEN 650 MG RE SUPP: 650.0000 mg | Freq: Four times a day (QID) | RECTAL | Status: DC | PRN

## 2015-03-12 MED ORDER — SODIUM CHLORIDE 0.9 % IV BOLUS (SEPSIS)
500.0000 mL | Freq: Once | INTRAVENOUS | Status: AC
  Administered 2015-03-12: 500 mL via INTRAVENOUS

## 2015-03-12 MED ORDER — SODIUM CHLORIDE 0.9 % IV SOLN
INTRAVENOUS | Status: AC
  Administered 2015-03-12: 20:00:00 via INTRAVENOUS

## 2015-03-12 MED ORDER — PANTOPRAZOLE SODIUM 40 MG PO TBEC
40.0000 mg | DELAYED_RELEASE_TABLET | Freq: Two times a day (BID) | ORAL | Status: DC
  Administered 2015-03-12 – 2015-03-14 (×5): 40 mg via ORAL
  Filled 2015-03-12 (×5): qty 1

## 2015-03-12 MED ORDER — NITROGLYCERIN 0.4 MG SL SUBL: 0.4000 mg | SUBLINGUAL_TABLET | SUBLINGUAL | Status: DC | PRN

## 2015-03-12 MED ORDER — VANCOMYCIN HCL IN DEXTROSE 1-5 GM/200ML-% IV SOLN: 1000.0000 mg | Freq: Once | INTRAVENOUS | Status: DC

## 2015-03-12 MED ORDER — VANCOMYCIN HCL IN DEXTROSE 1-5 GM/200ML-% IV SOLN
INTRAVENOUS | Status: AC
Start: 2015-03-12 — End: 2015-03-12
  Filled 2015-03-12: qty 200

## 2015-03-12 MED ORDER — VANCOMYCIN HCL 10 G IV SOLR
1500.0000 mg | Freq: Two times a day (BID) | INTRAVENOUS | Status: DC
  Administered 2015-03-12 – 2015-03-14 (×5): 1500 mg via INTRAVENOUS
  Filled 2015-03-12 (×13): qty 1500

## 2015-03-12 MED ORDER — OXYCODONE HCL ER 10 MG PO T12A
10.0000 mg | EXTENDED_RELEASE_TABLET | Freq: Two times a day (BID) | ORAL | Status: DC
  Administered 2015-03-12 – 2015-03-14 (×5): 10 mg via ORAL
  Filled 2015-03-12 (×5): qty 1

## 2015-03-12 MED ORDER — ALBUTEROL SULFATE (2.5 MG/3ML) 0.083% IN NEBU
2.5000 mg | INHALATION_SOLUTION | Freq: Four times a day (QID) | RESPIRATORY_TRACT | Status: DC | PRN
  Administered 2015-03-12 – 2015-03-13 (×2): 2.5 mg via RESPIRATORY_TRACT
  Filled 2015-03-12 (×2): qty 3

## 2015-03-12 MED ORDER — SODIUM CHLORIDE 0.9 % IV SOLN: 1000.0000 mL | INTRAVENOUS | Status: DC

## 2015-03-12 MED ORDER — AMLODIPINE BESYLATE 5 MG PO TABS
10.0000 mg | ORAL_TABLET | Freq: Every day | ORAL | Status: DC
  Administered 2015-03-12: 10 mg via ORAL
  Filled 2015-03-12: qty 2

## 2015-03-12 MED ORDER — PIPERACILLIN-TAZOBACTAM 3.375 G IVPB 30 MIN
3.3750 g | Freq: Once | INTRAVENOUS | Status: AC
  Administered 2015-03-12: 3.375 g via INTRAVENOUS
  Filled 2015-03-12: qty 50

## 2015-03-12 MED ORDER — ATORVASTATIN CALCIUM 40 MG PO TABS
80.0000 mg | ORAL_TABLET | Freq: Every day | ORAL | Status: DC
  Administered 2015-03-12 – 2015-03-14 (×3): 80 mg via ORAL
  Filled 2015-03-12 (×3): qty 2

## 2015-03-12 MED ORDER — VANCOMYCIN HCL IN DEXTROSE 1-5 GM/200ML-% IV SOLN
1000.0000 mg | Freq: Once | INTRAVENOUS | Status: AC
  Administered 2015-03-12: 1000 mg via INTRAVENOUS
  Filled 2015-03-12: qty 200

## 2015-03-12 MED ORDER — ALBUTEROL SULFATE (2.5 MG/3ML) 0.083% IN NEBU
2.5000 mg | INHALATION_SOLUTION | Freq: Once | RESPIRATORY_TRACT | Status: AC
  Administered 2015-03-12: 2.5 mg via RESPIRATORY_TRACT
  Filled 2015-03-12: qty 3

## 2015-03-12 NOTE — ED Notes (Signed)
CRITICAL VALUE ALERT  Critical value received:  Lactic 2.1  Date of notification:  03/12/15  Time of notification:  0912  Critical value read back:Yes.    Nurse who received alert:  Charmayne Sheer, RN  MD notified (1st page):  Lily Kocher

## 2015-03-12 NOTE — ED Notes (Signed)
Limiting fluids at this time for history of CHF, per Lily Kocher.

## 2015-03-12 NOTE — ED Notes (Signed)
CRITICAL VALUE ALERT  Critical value received:  GLUCOSE 551  Date of notification:  03/12/15  Time of notification:  0910  Critical value read back:Yes.    Nurse who received alert:  Charmayne Sheer, RN  MD notified (1st page):  Lily Kocher

## 2015-03-12 NOTE — ED Notes (Signed)
MD at bedside. 

## 2015-03-12 NOTE — Progress Notes (Signed)
Dr. Dyann Kief notified of new admission.

## 2015-03-12 NOTE — ED Notes (Signed)
Patient unable to provide urine specimen at this time.

## 2015-03-12 NOTE — Progress Notes (Signed)
Patient CBG 309. No insulin ordered at this time. Dr. Dyann Kief notified.

## 2015-03-12 NOTE — H&P (Addendum)
[facility] History and Physical  Manuel Knight [id number] DOB: 1960/08/09 DOA: 03/12/2015  Referring physician: Dr. Reather Knight PCP: [facility]   Chief Complaint: Scrotal swelling, erythema and pain  HPI: Manuel Knight is a 55 y.o. male with a past medical history significant for hyperlipidemia, COPD, obesity, coronary artery disease, chronic systolic heart failure (last ejection fraction 35%), hypertension, diabetes type 2 (chronically on insulin and oral hypoglycemic regimen) and GERD; who presented to the emergency department secondary to swelling, erythema and pain on his scrotum. Patient reports approximately 3-4 days of discomfort on his scrotum (specifically the left side), with associated erythema, swelling and in the last day prior to admission active, purulent/serosanguineous drainage. Patient endorses some chills, but denies frank fever. He also denies chest pain, shortness of breath, cough, abdominal pain, dysuria, melena, hematochezia, hematemesis, headaches, focal lateralizing weakness/deficit or any other acute complaints. In date the patient was found to be afebrile, with normal vital signs, elevated WBCs of 12.5 and findings on physical exam for scrotal cellulitis with early abscess. Patient has scrotal ultrasound and also CT abdomen and pelvis, both of them rule out the presence of fournier's and confirmed diagnosis of cellulitis with possible early abscess. Patient also has hyperglycemia with CBG's in the 500 range while in ED. No sepsis and nontoxic on exam.    Review of Systems:  Negative except as otherwise mentioned in history of present illness.   Past Medical History  Diagnosis Date  . Anxiety   . Pulmonary nodule   . Hyperlipidemia   . COPD (chronic obstructive pulmonary disease) ([place])   . Chronic back pain   . Obesity   . Diabetes mellitus, type 2 (Manuel Knight)   . Hypertension   . Cellulitis 2011    of neck   . GERD  (gastroesophageal reflux disease)   . Claustrophobia   . Diverticulitis of colon [id number]    complicated by abscess  . Heart attack [facility])    Past Surgical History  Procedure Laterality Date  . Appendectomy  2007    gangrenous appendicitis   . Tonsillectomy    . Coronary artery bypass graft N/A 02/06/2013    Procedure: CORONARY ARTERY BYPASS GRAFTING (CABG) with TEE;  Surgeon: [name], MD;  Location: [place];  Service: Open Heart Surgery;  Laterality: N/A;  CABG x three,  using left internal mammary artery and right leg greater saphenous vein harvested endoscopically  . Left heart catheterization with coronary angiogram N/A 02/03/2013    Procedure: LEFT HEART CATHETERIZATION WITH CORONARY ANGIOGRAM;  Surgeon: Jettie Booze, MD;  Location: [facility] CATH LAB;  Service: Cardiovascular;  Laterality: N/A;   Social History:  reports that he quit smoking about 26 years ago. His smoking use included Cigarettes. He started smoking about 48 years ago. He has a 28.5 pack-year smoking history. He has never used smokeless tobacco. He reports that he uses illicit drugs (Marijuana). He reports that he does not drink alcohol.  Allergies  Allergen Reactions  . Tylenol [Acetaminophen] Hives  . Lodine [Etodolac] Swelling and Rash    Rash and swelling   . Propoxyphene N-Acetaminophen Other (See Comments)    unknown    Family History  Problem Relation Age of Onset  . Heart attack Father   . Diabetes Sister   . Coronary artery disease Sister   . Heart attack Brother   . Colon cancer Neg Hx   . Cancer Mother     "blood transplant"  . Coronary artery  disease Mother     Prior to Admission medications   Medication Sig Start Date End Date Taking? Authorizing Provider  alprazolam [name]) 2 MG tablet Take 1 tablet by mouth 4 (four) times daily. 11/02/13  Yes Historical Provider, MD  amLODipine (NORVASC) 10 MG tablet Take 10 mg by mouth daily.   Yes Historical Provider, MD  amphetamine-dextroamphetamine  (ADDERALL) 20 MG tablet Take 60 mg by mouth daily. 01/26/13  Yes Historical Provider, MD  aspirin EC 81 MG tablet Take 1 tablet (81 mg total) by mouth daily. 11/07/13  Yes Manuel Commons, MD  atorvastatin (LIPITOR) 80 MG tablet Take 1 tablet by mouth daily. 10/11/13  Yes Historical Provider, MD  Canagliflozin (INVOKANA) 100 MG TABS Take 100 mg by mouth daily.   Yes Historical Provider, MD  carvedilol (COREG) 25 MG tablet Take 1 tablet (25 mg total) by mouth 2 (two) times daily. 01/23/15  Yes Manuel Commons, MD  citalopram (CELEXA) 20 MG tablet Take 1 tablet by mouth daily. 10/17/13  Yes Historical Provider, MD  cloNIDine (CATAPRES) 0.3 MG tablet Take 1 tablet by mouth 2 (two) times daily. 10/17/13  Yes Historical Provider, MD  Fluticasone-Salmeterol (ADVAIR DISKUS) 500-50 MCG/DOSE AEPB Inhale 1 puff into the lungs every 12 (twelve) hours.     Yes Historical Provider, MD  insulin aspart (NOVOLOG FLEXPEN) 100 UNIT/ML FlexPen Inject 0-26 Units into the skin 3 (three) times daily with meals. 20 units three times a day with sliding scale max of 26 units   Yes Historical Provider, MD  metFORMIN (GLUCOPHAGE) 1000 MG tablet Take 1,000 mg by mouth 2 (two) times daily with a meal.   Yes Historical Provider, MD  nitroGLYCERIN (NITROSTAT) 0.4 MG SL tablet Place 1 tablet (0.4 mg total) under the tongue every 5 (five) minutes as needed for chest pain. 01/23/15  Yes Manuel Commons, MD  Oxycodone HCl 10 MG TABS Take 1 tablet by mouth 2 (two) times daily. 10/27/13  Yes Historical Provider, MD  pantoprazole (PROTONIX) 40 MG tablet TAKE ONE TABLET BY MOUTH TWICE DAILY 09/18/14  Yes Manuel Noel, MD  PROAIR HFA 108 (90 BASE) MCG/ACT inhaler Inhale 2 puffs into the lungs every 4 (four) hours as needed. 10/17/13  Yes Historical Provider, MD  sacubitril-valsartan (ENTRESTO) 97-103 MG Take 1 tablet by mouth 2 (two) times daily. 01/23/15  Yes Manuel Commons, MD  tiotropium (SPIRIVA) 18 MCG inhalation capsule Place  18 mcg into inhaler and inhale daily.     Yes Historical Provider, MD   Physical Exam: Filed Vitals:   03/12/15 1300 03/12/15 1347 03/12/15 1353 03/12/15 1436  BP: 145/84 163/99  159/90  Pulse: 103 107  103  Temp:    97.7 F (36.5 C)  TempSrc:    Oral  Resp: 14 18  16   Height:      Weight:      SpO2: 95% 94% 93% 92%    Wt Readings from Last 3 Encounters:  03/12/15 109.77 kg (242 lb)  01/23/15 112.946 kg (249 lb)  03/02/14 121.11 kg (267 lb)    General:  Patient in no distress, afebrile, complaining of discomfort for his discomfort due to swelling and ongoing drainage. Denies now sit, vomiting, hematuria, chest pain, shortness of breath, orthopnea or any other acute complaints. Eyes: PERRL, normal lids, irises & conjunctiva, no icterus, no nystagmus ENT: grossly normal hearing, no erythema, no exudates, no thrush; no drainage out of his ears or nostrils  Neck: no LAD, masses or  thyromegaly, no JVD Cardiovascular: RRR, no rubs, no gallops; positive systolic ejection murmur over mitral area. trace edema bilaterally. Respiratory: CTA bilaterally, no w/r/r. Normal respiratory effort. No use of accessory muscles. Abdomen: soft, nt, nd; positive bowel sounds Skin: Erythema, swelling, induration and active drainage out of the left side of his scrotum; no other open wounds or ulcerations appreciated.  Musculoskeletal: grossly normal tone BUE/BLE, FROM Psychiatric: grossly normal mood and affect, speech fluent and appropriate Neurologic: grossly non-focal.          Labs on Admission:  Basic Metabolic Panel:  Recent Labs Lab 03/12/15 0840  NA 133*  K 4.1  CL 96*  CO2 25  GLUCOSE 551*  BUN 18  CREATININE 0.88  CALCIUM 9.0   CBC:  Recent Labs Lab 03/12/15 0840  WBC 12.5*  NEUTROABS 9.0*  HGB 15.2  HCT 44.7  MCV 89.8  PLT 278   CBG:  Recent Labs Lab 03/12/15 0826 03/12/15 1020 03/12/15 1301 03/12/15 1702  GLUCAP 506* 444* 322* 309*    Radiological Exams on  Admission: US Scrotum  03/12/2015  CLINICAL DATA:  Left scrotal abscess.  Diabetes. EXAM: SCROTAL ULTRASOUND DOPPLER ULTRASOUND OF THE TESTICLES TECHNIQUE: Complete ultrasound examination of the testicles, epididymis, and other scrotal structures was performed. Color and spectral Doppler ultrasound were also utilized to evaluate blood flow to the testicles. COMPARISON:  None. FINDINGS: Right testicle Measurements: 3.9 x 2.3 x 2.7 cm, within normal limits. No mass or microlithiasis visualized. Left testicle Measurements: 4.1 x 2.3 x 2.6 cm, within normal limits. No mass or microlithiasis visualized. Right epididymis:  Normal in size and appearance. Left epididymis:  Normal in size and appearance. Hydrocele: Small bilateral hydroceles are present. These appear simple. Varicocele:  None visualized. Pulsed Doppler interrogation of both testes demonstrates normal low resistance arterial and venous waveforms bilaterally. There is a complex hyperechoic area along the posterior aspect of the scrotum, more prominent on the left. Scrotal skin thickening measures up to 1.1 cm. IMPRESSION: 1. Complex hyperechoic skin thickening along the posterior aspect of the scrotum compatible with cellulitis. No discrete abscess is evident. 2. Normal appearance of the testicles bilaterally. 3. Small bilateral hydroceles. 4. Normal pulsed Doppler interrogation within normal waveforms and color Doppler signal. Electronically Signed   By: San Morelle M.D.   On: 03/12/2015 10:40   Ct Abdomen Pelvis W Contrast  03/12/2015  CLINICAL DATA:  Left scrotal pain and swelling for 4 days, opened and drained last night, status post appendectomy EXAM: CT ABDOMEN AND PELVIS WITH CONTRAST TECHNIQUE: Multidetector CT imaging of the abdomen and pelvis was performed using the standard protocol following bolus administration of intravenous contrast. CONTRAST:  132mL OMNIPAQUE IOHEXOL 300 MG/ML  SOLN COMPARISON:  Scrotal ultrasound 03/12/2015 and  CT scan abdomen and pelvis 08/10/2010 FINDINGS: Sagittal images of the spine shows mild degenerative changes thoracolumbar spine. There is small right pleural effusion with right base posterior atelectasis. Significant fatty infiltration of the liver again noted. No focal hepatic mass. No calcified gallstones are noted within gallbladder. A distal duodenum diverticulum containing some air and contrast material again noted without significant change from prior exam. Enhanced pancreas, spleen and adrenal glands are unremarkable. Enhanced kidneys are symmetrical in size. No hydronephrosis or hydroureter. Delayed renal images shows bilateral renal symmetrical excretion. Tiny hiatal hernia is noted. Atherosclerotic calcifications of abdominal aorta and iliac arteries. No aortic aneurysm. No small bowel obstruction.  No ascites or free air.  No adenopathy. There is no pericecal inflammation. The patient  is status post appendectomy. Some colonic stool and gas noted in right colon and transverse colon. No colonic obstruction. No diverticulitis. Few diverticula are noted proximal sigmoid colon. No acute diverticulitis. Some colonic gas and stool noted in sigmoid colon and rectum. Prostate gland calcifications are noted. The the As noted on recent scrotal ultrasound there is skin thickening and subcutaneous edema and stranding in left posterior scrotum/anterior perineum. Small irregular fluid noted in left posterior scrotum axial image 106 superficial measures about 1.7 cm. This corresponds to focal hyperechoic area noted on ultrasound. This is highly suspicious for focal cellulitis and subcutaneous edema. Early subcutaneous abscess cannot be excluded. Clinical correlation is necessary. There is no evidence of subcutaneous air or air-fluid level. Small bilateral hydrocele is noted. A right inguinal lymph node measures 1.2 cm in diameter. Left inguinal lymph node measures 1.2 cm in diameter. These are borderline enlarged by  size criteria probable reactive. IMPRESSION: 1. As noted on recent scrotal ultrasound there is skin thickening and subcutaneous edema and stranding in left posterior scrotum/anterior perineum. Small subcutaneous irregular fluid noted in left posterior scrotum axial image 106 measures about 1.7 cm. This corresponds to focal hyperechoic area noted on ultrasound. This is highly suspicious for focal cellulitis and subcutaneous edema. Early subcutaneous abscess cannot be excluded. Clinical correlation is necessary. There is no evidence of subcutaneous air or air-fluid level. 2. Small bilateral hydrocele. 3. Borderline bilateral inguinal enlarged lymph nodes probable reactive. 4. Again noted significant fatty infiltration of the liver. 5. No hydronephrosis or hydroureter. 6. Small right pleural effusion with right basilar atelectasis. 7. Stable distal duodenum diverticulum. Electronically Signed   By: Lahoma Crocker M.D.   On: 03/12/2015 12:39   [place] Art/ven Flow Abd Pelv Doppler  03/12/2015  CLINICAL DATA:  Left scrotal abscess.  Diabetes. EXAM: SCROTAL ULTRASOUND DOPPLER ULTRASOUND OF THE TESTICLES TECHNIQUE: Complete ultrasound examination of the testicles, epididymis, and other scrotal structures was performed. Color and spectral Doppler ultrasound were also utilized to evaluate blood flow to the testicles. COMPARISON:  None. FINDINGS: Right testicle Measurements: 3.9 x 2.3 x 2.7 cm, within normal limits. No mass or microlithiasis visualized. Left testicle Measurements: 4.1 x 2.3 x 2.6 cm, within normal limits. No mass or microlithiasis visualized. Right epididymis:  Normal in size and appearance. Left epididymis:  Normal in size and appearance. Hydrocele: Small bilateral hydroceles are present. These appear simple. Varicocele:  None visualized. Pulsed Doppler interrogation of both testes demonstrates normal low resistance arterial and venous waveforms bilaterally. There is a complex hyperechoic area along the posterior  aspect of the scrotum, more prominent on the left. Scrotal skin thickening measures up to 1.1 cm. IMPRESSION: 1. Complex hyperechoic skin thickening along the posterior aspect of the scrotum compatible with cellulitis. No discrete abscess is evident. 2. Normal appearance of the testicles bilaterally. 3. Small bilateral hydroceles. 4. Normal pulsed Doppler interrogation within normal waveforms and color Doppler signal. Electronically Signed   By: San Morelle M.D.   On: 03/12/2015 10:40    EKG:  None  Assessment/Plan 1-Cellulitis, scrotum: With signs of early abscess on physical examination/clinical assessment -Patient will be admitted to [place] -Broad-spectrum antibiotics (vancomycin and Zosyn) -As needed antiemetics, analgesics and antipyretics -warm compresses application -Will provide supportive care and follow clinical response -follow urine culture and blood cx's data  2-Type 2 diabetes mellitus with hyperglycemia, with long-term current use of insulin ([place]): On control and currently with hyperglycemia -Infection most probably contributing to patient's uncontrolled diabetes currently -Will hold oral hypoglycemic  regimen -will start patient on levemir and SSI -follow CBG's and adjust insulin therapy as needed -will check A1C  3-HLD (hyperlipidemia): -Continue statins -will check lipid panel  4-Obesity, unspecified -Body mass index is 35.72 kg/(m^2). -Low calorie diet and increase on exertion has been discussed with patient   5-Anxiety state/depression: -Condition is stable, no suicidal ideation or hallucinations -Continue home medications Celexa and as needed xanax  6-Essential hypertension: Stable overall -Will continue home antihypertensive regimen  7-ADHD: will continue prescribed adderall)  8-GERD (gastroesophageal reflux disease): -continue PPI  9-Chronic systolic heart failure ([place]): -Appears to be compensated currently -Last ejection fraction in the 35%  range -Will continue home medication regimen -Daily weights and strict intake output -Heart healthy diet  10-Emphysema of lung ([place]) -Will continue as needed nebulizer (albuterol)  -Twice a day Pulmicort and Spiriva  -Patient is currently now with an unwitnessed stable air movement.  11-chronic pain syndrome: -Continue PRN analgesics  Code Status: Full code DVT Prophylaxis: Heparin Family Communication: No family at bedside Disposition Plan: Length of stay > 2 midnights, inpatient, med-surg bed  Time spent: 70 minutes  Barton Dubois [facility] Pager [phone]

## 2015-03-12 NOTE — ED Notes (Addendum)
PT c/o scrotal swelling and pain x4 days with area opening last night and draining with nausea and lack of appetitie. PT denies any urinary symptoms or fevers.

## 2015-03-12 NOTE — ED Notes (Signed)
Lab at bedside getting cultures °

## 2015-03-12 NOTE — ED Provider Notes (Signed)
CSN: [id number]     Arrival date & time 03/12/15  [id number] History   First MD Initiated Contact with Patient 03/12/15 [phone]     No chief complaint on file.    (Consider location/radiation/quality/duration/timing/severity/associated sxs/prior Treatment) HPI Comments: Patient is a 55 year old male who presents to the emergency department with a complaint of scrotal area pain and swelling.  . Patient has a history of chronic congestive heart failure with an ejection fraction of 30%. Has a history of coronary artery disease requiring coronary artery bypass grafting. Anxiety, chronic objective pulmonary disease, diabetes mellitus, hypertension, and claustrophobia.  The patient states he is noticing of scrotal swelling, followed by scrotal pain, followed by drainage over the last 4 days. The drainage actually started on last night. The patient on states he's had some chills, but no actual fever. He has had no problems with urinary symptoms. Will will likely a  The history is provided by the patient.    Past Medical History  Diagnosis Date  . Anxiety   . Pulmonary nodule   . Hyperlipidemia   . COPD (chronic obstructive pulmonary disease) ([place])   . Chronic back pain   . Obesity   . Diabetes mellitus, type 2 ([place])   . Hypertension   . Cellulitis 2011    of neck   . GERD (gastroesophageal reflux disease)   . Claustrophobia   . Diverticulitis of colon [id number]    complicated by abscess  . Heart attack [facility])    Past Surgical History  Procedure Laterality Date  . Appendectomy  2007    gangrenous appendicitis   . Tonsillectomy    . Coronary artery bypass graft N/A 02/06/2013    Procedure: CORONARY ARTERY BYPASS GRAFTING (CABG) with TEE;  Surgeon: [name], MD;  Location: [place];  Service: Open Heart Surgery;  Laterality: N/A;  CABG x three,  using left internal mammary artery and right leg greater saphenous vein harvested endoscopically  . Left heart catheterization with coronary angiogram N/A  02/03/2013    Procedure: LEFT HEART CATHETERIZATION WITH CORONARY ANGIOGRAM;  Surgeon: Jettie Booze, MD;  Location: [facility] CATH LAB;  Service: Cardiovascular;  Laterality: N/A;   Family History  Problem Relation Age of Onset  . Heart attack Father   . Diabetes Sister   . Coronary artery disease Sister   . Heart attack Brother   . Colon cancer Neg Hx   . Cancer Mother     "blood transplant"  . Coronary artery disease Mother    Social History  Substance Use Topics  . Smoking status: Former Smoker -- 1.50 packs/day for 19 years    Types: Cigarettes    Start date: 03/12/1967    Quit date: 01/13/1988  . Smokeless tobacco: Never Used  . Alcohol Use: No     Comment:      Review of Systems  Respiratory: Positive for shortness of breath.   Musculoskeletal: Positive for back pain.  Psychiatric/Behavioral: The patient is nervous/anxious.       Allergies  Tylenol; Lodine; and Propoxyphene n-acetaminophen  Home Medications   Prior to Admission medications   Medication Sig Start Date End Date Taking? Authorizing Provider  alprazolam [name]) 2 MG tablet Take 1 tablet by mouth 4 (four) times daily. 11/02/13   Historical Provider, MD  amLODipine (NORVASC) 10 MG tablet Take 10 mg by mouth daily.    Historical Provider, MD  amphetamine-dextroamphetamine (ADDERALL) 20 MG tablet Take 60 mg by mouth daily. 01/26/13   Historical Provider,  MD  aspirin EC 81 MG tablet Take 1 tablet (81 mg total) by mouth daily. 11/07/13   Herminio Commons, MD  atorvastatin (LIPITOR) 80 MG tablet Take 1 tablet by mouth daily. 10/11/13   Historical Provider, MD  Canagliflozin (INVOKANA) 100 MG TABS Take 100 mg by mouth daily.    Historical Provider, MD  carvedilol (COREG) 25 MG tablet Take 1 tablet (25 mg total) by mouth 2 (two) times daily. 01/23/15   Herminio Commons, MD  citalopram (CELEXA) 20 MG tablet Take 1 tablet by mouth daily. 10/17/13   Historical Provider, MD  cloNIDine (CATAPRES) 0.3 MG tablet  Take 1 tablet by mouth 2 (two) times daily. 10/17/13   Historical Provider, MD  Fluticasone-Salmeterol (ADVAIR DISKUS) 500-50 MCG/DOSE AEPB Inhale 1 puff into the lungs every 12 (twelve) hours.      Historical Provider, MD  insulin aspart (NOVOLOG FLEXPEN) 100 UNIT/ML FlexPen Inject 0-26 Units into the skin 3 (three) times daily with meals. 20 units three times a day with sliding scale max of 26 units    Historical Provider, MD  metFORMIN (GLUCOPHAGE) 1000 MG tablet Take 1,000 mg by mouth 2 (two) times daily with a meal.    Historical Provider, MD  nitroGLYCERIN (NITROSTAT) 0.4 MG SL tablet Place 1 tablet (0.4 mg total) under the tongue every 5 (five) minutes as needed for chest pain. 01/23/15   Herminio Commons, MD  Oxycodone HCl 10 MG TABS Take 1 tablet by mouth 2 (two) times daily. 10/27/13   Historical Provider, MD  pantoprazole (PROTONIX) 40 MG tablet TAKE ONE TABLET BY MOUTH TWICE DAILY 09/18/14   Rigoberto Noel, MD  PROAIR HFA 108 (90 BASE) MCG/ACT inhaler Inhale 2 puffs into the lungs every 4 (four) hours as needed. 10/17/13   Historical Provider, MD  sacubitril-valsartan (ENTRESTO) 97-103 MG Take 1 tablet by mouth 2 (two) times daily. 01/23/15   Herminio Commons, MD  tiotropium (SPIRIVA) 18 MCG inhalation capsule Place 18 mcg into inhaler and inhale daily.      Historical Provider, MD   There were no vitals taken for this visit. Physical Exam  Constitutional: He is oriented to person, place, and time. He appears well-developed and well-nourished.  Non-toxic appearance.  HENT:  Head: Normocephalic.  Right Ear: Tympanic membrane and external ear normal.  Left Ear: Tympanic membrane and external ear normal.  Eyes: EOM and lids are normal. Pupils are equal, round, and reactive to light.  Neck: Normal range of motion. Neck supple. Carotid bruit is not present.  Cardiovascular: Regular rhythm, normal heart sounds, intact distal pulses and normal pulses.  Tachycardia present.   Pulmonary/Chest:  Breath sounds normal. No respiratory distress.  Scattered rhonchi  Abdominal: Soft. Bowel sounds are normal. There is no tenderness. There is no guarding.  Genitourinary: Left testis shows swelling and tenderness. Uncircumcised.  Abscess with mild drainage from the left scrotal area. Increase redness of the scrotum and penis. Few palpable nodes of the inguinal area.  Musculoskeletal: Normal range of motion.  Lymphadenopathy:       Head (right side): No submandibular adenopathy present.       Head (left side): No submandibular adenopathy present.    He has no cervical adenopathy.  Neurological: He is alert and oriented to person, place, and time. He has normal strength. No cranial nerve deficit or sensory deficit.  Skin: Skin is warm and dry.  Psychiatric: He has a normal mood and affect. His speech is normal.  Nursing  note and vitals reviewed.   ED Course  Patient seen with me by Dr. Reather Converse   Procedures (including critical care time)  CRITICAL CARE Performed by: Lenox Ahr Total critical care time: **40* minutes Critical care time was exclusive of separately billable procedures and treating other patients. Critical care was necessary to treat or prevent imminent or life-threatening deterioration. Critical care was time spent personally by me on the following activities: development of treatment plan with patient and/or surrogate as well as nursing, discussions with consultants, evaluation of patient's response to treatment, examination of patient, obtaining history from patient or surrogate, ordering and performing treatments and interventions, ordering and review of laboratory studies, ordering and review of radiographic studies, pulse oximetry and re-evaluation of patient's condition. Labs Review Labs Reviewed - No data to display  Imaging Review No results found. I have personally reviewed and evaluated these images and lab results as part of my medical decision-making.    EKG Interpretation None      MDM  Vital signs reviewed.  Urinalysis is negative for urinary tract infection or kidney stone. Complete blood count shows the white blood cells elevated at 12,500, no shift to the left noted. The basic metabolic panel shows the sodium to be slightly low at 133, the chloride slightly low at 96, the glucose elevated at 551. The initial lactic acid is elevated at 2.1.  Given the elevated white blood cell count, elevated heart rate, cellulitis/possible abscess of the scrotum and groin, and lactic acid of 2.1, code sepsis was called.  After fluids and IV insulin the glucose was improved to 444. Ultrasound of the scrotum and Doppler ultrasound of the testes reveals a complex hyperechoic skin thickening at the posterior aspect of the scrotum compatible with cellulitis. No discrete abscess is appreciated. There is good blood flow to the testicle area, no evidence of torsion.  CT scan shows some thickening and subcutaneous edema and stranding in the left posterior scrotum/anterior perineum. There is a small subcutaneous irregular fluid noted at the left posterior scrotum these findings are highly suspicious for focal cellulitis and subcutaneous edema. The possibility of early subcutaneous abscess cannot be excluded at this time. There is also noted of fatty infiltration of the liver, and a small right pleural effusion with right basilar atelectasis.  Case was discussed with the [facility] hospitalist. The patient will be admitted to [facility] telemetry. The patient has been started on vancomycin. Zosyn will also be given.  I have discussed the findings throughout the emergency department stay , and on multiple occasions,with the patient and family in terms which they understand. They are in agreement with hospital admission at this time.    Final diagnoses:  Cellulitis, scrotum    **I have reviewed nursing notes, vital signs, and all appropriate lab and imaging results for this  patient.Lily Kocher, PA-C 03/12/15 1342  Elnora Morrison, MD 03/12/15 [phone]

## 2015-03-12 NOTE — ED Notes (Signed)
Pt is in [place], unbale to get cultures at this time.

## 2015-03-12 NOTE — Progress Notes (Signed)
Pharmacy Antibiotic Note  Manuel Knight is a 55 y.o. male admitted on 03/12/2015 with cellulitis.  Pharmacy has been consulted for Vancomycin and Zosyn dosing.  Obesity/Normalized CrCl Vancomycin dosing protocol will be initiated with an estimated normalized CrCl = 97 ml/min. First doses given in ED.  Plan: Vancomycin 1500 IV every 12 hours.  Goal trough 10-15 mcg/mL. Zosyn 3.375g IV q8h (4 hour infusion). Monitor labs, micro and vitals.   Height: 5\' 9"  (175.3 cm) Weight: 242 lb (109.77 kg) IBW/kg (Calculated) : 70.7  Temp (24hrs), Avg:98.1 F (36.7 C), Min:97.7 F (36.5 C), Max:98.6 F (37 C)   Recent Labs Lab 03/12/15 0840 03/12/15 1100  WBC 12.5*  --   CREATININE 0.88  --   LATICACIDVEN 2.1* 1.3    Estimated Creatinine Clearance: 115.8 mL/min (by C-G formula based on Cr of 0.88).    Allergies  Allergen Reactions  . Tylenol [Acetaminophen] Hives  . Lodine [Etodolac] Swelling and Rash    Rash and swelling   . Propoxyphene N-Acetaminophen Other (See Comments)    unknown    Antimicrobials this admission: Vancomycin 2/28 >>  Zosyn  2/28 >>   Dose adjustments this admission: n/a  Microbiology results: 2/28 BCx: pending   Thank you for allowing pharmacy to be a part of this patient's care.  Pricilla Larsson 03/12/2015 6:08 PM

## 2015-03-13 LAB — GLUCOSE, CAPILLARY
GLUCOSE-CAPILLARY: 281 mg/dL — AB (ref 65–99)
GLUCOSE-CAPILLARY: 119 mg/dL — AB (ref 65–99)
GLUCOSE-CAPILLARY: 130 mg/dL — AB (ref 65–99)
Glucose-Capillary: 341 mg/dL — ABNORMAL HIGH (ref 65–99)

## 2015-03-13 LAB — HEMOGLOBIN A1C
Hgb A1c MFr Bld: 13.8 % — ABNORMAL HIGH (ref 4.8–5.6)
MEAN PLASMA GLUCOSE: 349 mg/dL

## 2015-03-13 LAB — BASIC METABOLIC PANEL
ANION GAP: 7 (ref 5–15)
BUN: 16 mg/dL (ref 6–20)
CHLORIDE: 102 mmol/L (ref 101–111)
CO2: 26 mmol/L (ref 22–32)
Calcium: 7.9 mg/dL — ABNORMAL LOW (ref 8.9–10.3)
Creatinine, Ser: 0.88 mg/dL (ref 0.61–1.24)
GFR calc non Af Amer: >60 mL/min (ref >60)
Glucose, Bld: 391 mg/dL — ABNORMAL HIGH (ref 65–99)
POTASSIUM: 4.5 mmol/L (ref 3.5–5.1)
SODIUM: 135 mmol/L (ref 135–145)

## 2015-03-13 LAB — LIPID PANEL
CHOL/HDL RATIO: 7.6 ratio
CHOLESTEROL: 183 mg/dL (ref 0–200)
HDL: 24 mg/dL — ABNORMAL LOW (ref >40)
LDL Cholesterol: 121 mg/dL — ABNORMAL HIGH (ref 0–99)
TRIGLYCERIDES: 191 mg/dL — AB (ref <150)
VLDL: 38 mg/dL (ref 0–40)

## 2015-03-13 LAB — CBC
HEMATOCRIT: 37.4 % — AB (ref 39.0–52.0)
Hemoglobin: 12.2 g/dL — ABNORMAL LOW (ref 13.0–17.0)
MCH: 29.8 pg (ref 26.0–34.0)
MCHC: 32.6 g/dL (ref 30.0–36.0)
MCV: 91.4 fL (ref 78.0–100.0)
Platelets: 261 10*3/uL (ref 150–400)
RBC: 4.09 MIL/uL — AB (ref 4.22–5.81)
RDW: 13.7 % (ref 11.5–15.5)
WBC: 9 10*3/uL (ref 4.0–10.5)

## 2015-03-13 MED ORDER — INSULIN ASPART 100 UNIT/ML ~~LOC~~ SOLN: 10.0000 [IU] | Freq: Three times a day (TID) | SUBCUTANEOUS | Status: DC

## 2015-03-13 MED ORDER — INSULIN DETEMIR 100 UNIT/ML ~~LOC~~ SOLN
20.0000 [IU] | Freq: Every day | SUBCUTANEOUS | Status: DC
  Administered 2015-03-13 – 2015-03-14 (×2): 20 [IU] via SUBCUTANEOUS
  Filled 2015-03-13 (×5): qty 0.2

## 2015-03-13 MED ORDER — INSULIN ASPART 100 UNIT/ML ~~LOC~~ SOLN
8.0000 [IU] | Freq: Three times a day (TID) | SUBCUTANEOUS | Status: DC
  Administered 2015-03-13 – 2015-03-15 (×6): 8 [IU] via SUBCUTANEOUS

## 2015-03-13 NOTE — Progress Notes (Signed)
Inpatient Diabetes Program Recommendations  AACE/ADA: New Consensus Statement on Inpatient Glycemic Control (2015)  Target Ranges:  Prepandial:   less than 140 mg/dL      Peak postprandial:   less than 180 mg/dL (1-2 hours)      Critically ill patients:  140 - 180 mg/dL   Results for JANDRE, SALDIERNA (MRN [id number]) as of 03/13/2015 12:30  Ref. Range 03/12/2015 10:20 03/12/2015 13:01 03/12/2015 17:02 03/12/2015 22:37 03/13/2015 07:39  Glucose-Capillary Latest Ref Range: 65-99 mg/dL 444 (H) 322 (H) 309 (H) 361 (H) 341 (H)   3/1 New hospital insulin orders:  Mod correction scale tid, 8 units meal coverage, 20 units levemir daily. Pt states that his BS run about 101 at home.  His HgbA1c is 13.8  Discussed that an  A1c that high reflects average BS of 350-380.  Suggested he bring in his meter to check against hospital reading .  Discussed the impact of elevated BS  on infection.  Encouraged to attend outpatient class.  Left information regarding class at [facility].    [place], CDE. M.Ed. Pager [phone] Inpatient Diabetes Coordinator

## 2015-03-13 NOTE — Progress Notes (Signed)
[facility]                                                                              Patient Demographics  Manuel Knight, is a 55 y.o. male, DOB - 29-Dec-1960, [id number]  Admit date - 03/12/2015   Admitting Physician Barton Dubois, MD  Outpatient Primary MD for the patient is [facility]  LOS - 1   Chief Complaint  Patient presents with  . Groin Swelling       Brief HPI   Manuel Knight is a 55 y.o. male with a past medical history significant for hyperlipidemia, COPD, obesity, coronary artery disease, chronic systolic heart failure (last ejection fraction 35%), hypertension, diabetes type 2 (chronically on insulin and oral hypoglycemic regimen) and GERD; who presented to the emergency department secondary to swelling, erythema and pain on his scrotum. Patient reports approximately 3-4 days of discomfort on his scrotum (specifically the left side), with associated erythema, swelling and in the last day prior to admission active, purulent/serosanguineous drainage. Patient has scrotal ultrasound and also CT abdomen and pelvis, both of them rule out the presence of fournier's and confirmed diagnosis of cellulitis with possible early abscess. Patient also has hyperglycemia with CBG's in the 500 range while in ED  Assessment & Plan   Cellulitis, scrotum:  -Continue broad-spectrum antibiotics (vancomycin and Zosyn) - Continue pain control, warm compresses  - follow urine culture and blood cx's data - Urology consult placed  Type 2 diabetes mellitus with hyperglycemia, with long-term current use of insulin ([place]): Poorly controlled - Significant hyperglycemia, increase Levemir to 20 units  - Add meal coverage 8 units, siting scale insulin  - Hemoglobin A1c 13.8  HLD (hyperlipidemia): -Continue statins -LDL 121  Obesity, unspecified -Body mass index is 35.72 kg/(m^2). - Patient counseled on diet and weight control   Anxiety  state/depression: - no suicidal ideation or hallucinations -Continue home medications Celexa and as needed xanax  Essential hypertension: Stable overall -Will continue home antihypertensive regimen  ADHD: will continue prescribed adderall)  GERD (gastroesophageal reflux disease): -continue PPI  Chronic systolic heart failure ([place]): -Appears to be compensated currently -Last echo in 11/2013 showed EF  35%, strict I's and O's and daily weights   Emphysema of lung ([place]) -Will continue as needed nebulizer (albuterol)  -Twice a day Pulmicort and Spiriva  -Patient is currently now with an unwitnessed stable air movement.  chronic pain syndrome: -Continue PRN analgesics   Code Status: Full code  Family Communication: Discussed in detail with the patient, all imaging results, lab results explained to the patient    Disposition Plan:  Time Spent in minutes   25 minutes  Procedures  None   Consults   urology  DVT Prophylaxis SCD's  Medications  Scheduled Meds: . alprazolam  2 mg Oral QID  . amphetamine-dextroamphetamine  60 mg Oral Daily  . aspirin EC  81 mg Oral Daily  . atorvastatin  80 mg Oral q1800  . budesonide (PULMICORT) nebulizer solution  0.25 mg Nebulization BID  . carvedilol  25 mg Oral BID WC  . citalopram  20 mg Oral Daily  .  heparin  5,000 Units Subcutaneous 3 times per day  . insulin aspart  0-15 Units Subcutaneous TID WC  . insulin detemir  10 Units Subcutaneous QHS  . oxyCODONE  10 mg Oral BID  . pantoprazole  40 mg Oral BID  . piperacillin-tazobactam (ZOSYN)  IV  3.375 g Intravenous Q8H  . sacubitril-valsartan  1 tablet Oral BID  . tiotropium  18 mcg Inhalation Daily  . vancomycin  1,500 mg Intravenous Q12H   Continuous Infusions:  PRN Meds:.albuterol, morphine injection, nitroGLYCERIN, ondansetron **OR** ondansetron (ZOFRAN) IV   Antibiotics   Anti-infectives    Start     Dose/Rate Route Frequency Ordered Stop   03/12/15 2000   piperacillin-tazobactam (ZOSYN) IVPB 3.375 g     3.375 g 12.5 mL/hr over 240 Minutes Intravenous Every 8 hours 03/12/15 1817     03/12/15 1830  vancomycin (VANCOCIN) 1,500 mg in sodium chloride 0.9 % 500 mL IVPB     1,500 mg 250 mL/hr over 120 Minutes Intravenous Every 12 hours 03/12/15 1817     03/12/15 1800  vancomycin (VANCOCIN) IVPB 1000 mg/200 mL premix  Status:  Discontinued     1,000 mg 200 mL/hr over 60 Minutes Intravenous  Once 03/12/15 1745 03/12/15 1758   03/12/15 1345  piperacillin-tazobactam (ZOSYN) IVPB 3.375 g     3.375 g 100 mL/hr over 30 Minutes Intravenous  Once 03/12/15 1331 03/12/15 1419   03/12/15 1125  vancomycin (VANCOCIN) 1 GM/200ML IVPB    Comments:  Charmayne Sheer   : cabinet override      03/12/15 1125 03/12/15 1130   03/12/15 1030  vancomycin (VANCOCIN) IVPB 1000 mg/200 mL premix     1,000 mg 200 mL/hr over 60 Minutes Intravenous  Once 03/12/15 1022 03/12/15 1219        Subjective:   Manuel Knight was seen and examined today.  Complaining of ongoing discomfort in the scrotal region. Patient denies dizziness, chest pain, shortness of breath, N/V/D/C, new weakness, numbess, tingling. No acute events overnight.    Objective:   Filed Vitals:   03/12/15 2316 03/12/15 2341 03/13/15 0030 03/13/15 0438  BP: 78/52 85/45 106/62 114/68  Pulse:    81  Temp: 97.1 F (36.2 C)   98.4 F (36.9 C)  TempSrc: Oral   Axillary  Resp:    20  Height:    5\' 9"  (1.753 m)  Weight:    114.08 kg (251 lb 8 oz)  SpO2: 92% 96%  94%    Intake/Output Summary (Last 24 hours) at 03/13/15 1059 Last data filed at 03/12/15 1700  Gross per 24 hour  Intake    240 ml  Output      0 ml  Net    240 ml     Wt Readings from Last 3 Encounters:  03/13/15 114.08 kg (251 lb 8 oz)  01/23/15 112.946 kg (249 lb)  03/02/14 121.11 kg (267 lb)     Exam  General: Alert and oriented x 3, NAD  HEENT:  PERRLA, EOMI, Anicteric Sclera, mucous membranes moist.   Neck: Supple, no  JVD, no masses  CVS: S1 S2 auscultated, no rubs, murmurs or gallops. Regular rate and rhythm.  Respiratory: Clear to auscultation bilaterally, no wheezing, rales or rhonchi  Abdomen: Soft, nontender, nondistended, + bowel sounds  Ext: no cyanosis clubbing or edema  Neuro: AAOx3, Cr N's II- XII. Strength 5/5 upper and lower extremities bilaterally  Skin: Erythema, swelling, on the left side of the scrotum  Psych: Normal  affect and demeanor, alert and oriented x3    Data Review   Micro Results Recent Results (from the past 240 hour(s))  Blood culture (routine x 2)     Status: None (Preliminary result)   Collection Time: 03/12/15 10:48 AM  Result Value Ref Range Status   Specimen Description BLOOD RIGHT ANTECUBITAL  Final   Special Requests BOTTLES DRAWN AEROBIC AND ANAEROBIC 6CC EACH  Final   Culture NO GROWTH < 24 HOURS  Final   Report Status PENDING  Incomplete  Blood culture (routine x 2)     Status: None (Preliminary result)   Collection Time: 03/12/15 11:00 AM  Result Value Ref Range Status   Specimen Description BLOOD RIGHT HAND  Final   Special Requests BOTTLES DRAWN AEROBIC AND ANAEROBIC 5CC EACH  Final   Culture NO GROWTH < 24 HOURS  Final   Report Status PENDING  Incomplete    Radiology Reports US Scrotum  03/12/2015  CLINICAL DATA:  Left scrotal abscess.  Diabetes. EXAM: SCROTAL ULTRASOUND DOPPLER ULTRASOUND OF THE TESTICLES TECHNIQUE: Complete ultrasound examination of the testicles, epididymis, and other scrotal structures was performed. Color and spectral Doppler ultrasound were also utilized to evaluate blood flow to the testicles. COMPARISON:  None. FINDINGS: Right testicle Measurements: 3.9 x 2.3 x 2.7 cm, within normal limits. No mass or microlithiasis visualized. Left testicle Measurements: 4.1 x 2.3 x 2.6 cm, within normal limits. No mass or microlithiasis visualized. Right epididymis:  Normal in size and appearance. Left epididymis:  Normal in size and  appearance. Hydrocele: Small bilateral hydroceles are present. These appear simple. Varicocele:  None visualized. Pulsed Doppler interrogation of both testes demonstrates normal low resistance arterial and venous waveforms bilaterally. There is a complex hyperechoic area along the posterior aspect of the scrotum, more prominent on the left. Scrotal skin thickening measures up to 1.1 cm. IMPRESSION: 1. Complex hyperechoic skin thickening along the posterior aspect of the scrotum compatible with cellulitis. No discrete abscess is evident. 2. Normal appearance of the testicles bilaterally. 3. Small bilateral hydroceles. 4. Normal pulsed Doppler interrogation within normal waveforms and color Doppler signal. Electronically Signed   By: San Morelle M.D.   On: 03/12/2015 10:40   Ct Abdomen Pelvis W Contrast  03/12/2015  CLINICAL DATA:  Left scrotal pain and swelling for 4 days, opened and drained last night, status post appendectomy EXAM: CT ABDOMEN AND PELVIS WITH CONTRAST TECHNIQUE: Multidetector CT imaging of the abdomen and pelvis was performed using the standard protocol following bolus administration of intravenous contrast. CONTRAST:  119mL OMNIPAQUE IOHEXOL 300 MG/ML  SOLN COMPARISON:  Scrotal ultrasound 03/12/2015 and CT scan abdomen and pelvis 08/10/2010 FINDINGS: Sagittal images of the spine shows mild degenerative changes thoracolumbar spine. There is small right pleural effusion with right base posterior atelectasis. Significant fatty infiltration of the liver again noted. No focal hepatic mass. No calcified gallstones are noted within gallbladder. A distal duodenum diverticulum containing some air and contrast material again noted without significant change from prior exam. Enhanced pancreas, spleen and adrenal glands are unremarkable. Enhanced kidneys are symmetrical in size. No hydronephrosis or hydroureter. Delayed renal images shows bilateral renal symmetrical excretion. Tiny hiatal hernia is  noted. Atherosclerotic calcifications of abdominal aorta and iliac arteries. No aortic aneurysm. No small bowel obstruction.  No ascites or free air.  No adenopathy. There is no pericecal inflammation. The patient is status post appendectomy. Some colonic stool and gas noted in right colon and transverse colon. No colonic obstruction. No diverticulitis. Few  diverticula are noted proximal sigmoid colon. No acute diverticulitis. Some colonic gas and stool noted in sigmoid colon and rectum. Prostate gland calcifications are noted. The the As noted on recent scrotal ultrasound there is skin thickening and subcutaneous edema and stranding in left posterior scrotum/anterior perineum. Small irregular fluid noted in left posterior scrotum axial image 106 superficial measures about 1.7 cm. This corresponds to focal hyperechoic area noted on ultrasound. This is highly suspicious for focal cellulitis and subcutaneous edema. Early subcutaneous abscess cannot be excluded. Clinical correlation is necessary. There is no evidence of subcutaneous air or air-fluid level. Small bilateral hydrocele is noted. A right inguinal lymph node measures 1.2 cm in diameter. Left inguinal lymph node measures 1.2 cm in diameter. These are borderline enlarged by size criteria probable reactive. IMPRESSION: 1. As noted on recent scrotal ultrasound there is skin thickening and subcutaneous edema and stranding in left posterior scrotum/anterior perineum. Small subcutaneous irregular fluid noted in left posterior scrotum axial image 106 measures about 1.7 cm. This corresponds to focal hyperechoic area noted on ultrasound. This is highly suspicious for focal cellulitis and subcutaneous edema. Early subcutaneous abscess cannot be excluded. Clinical correlation is necessary. There is no evidence of subcutaneous air or air-fluid level. 2. Small bilateral hydrocele. 3. Borderline bilateral inguinal enlarged lymph nodes probable reactive. 4. Again noted  significant fatty infiltration of the liver. 5. No hydronephrosis or hydroureter. 6. Small right pleural effusion with right basilar atelectasis. 7. Stable distal duodenum diverticulum. Electronically Signed   By: Lahoma Crocker M.D.   On: 03/12/2015 12:39   [place] Art/ven Flow Abd Pelv Doppler  03/12/2015  CLINICAL DATA:  Left scrotal abscess.  Diabetes. EXAM: SCROTAL ULTRASOUND DOPPLER ULTRASOUND OF THE TESTICLES TECHNIQUE: Complete ultrasound examination of the testicles, epididymis, and other scrotal structures was performed. Color and spectral Doppler ultrasound were also utilized to evaluate blood flow to the testicles. COMPARISON:  None. FINDINGS: Right testicle Measurements: 3.9 x 2.3 x 2.7 cm, within normal limits. No mass or microlithiasis visualized. Left testicle Measurements: 4.1 x 2.3 x 2.6 cm, within normal limits. No mass or microlithiasis visualized. Right epididymis:  Normal in size and appearance. Left epididymis:  Normal in size and appearance. Hydrocele: Small bilateral hydroceles are present. These appear simple. Varicocele:  None visualized. Pulsed Doppler interrogation of both testes demonstrates normal low resistance arterial and venous waveforms bilaterally. There is a complex hyperechoic area along the posterior aspect of the scrotum, more prominent on the left. Scrotal skin thickening measures up to 1.1 cm. IMPRESSION: 1. Complex hyperechoic skin thickening along the posterior aspect of the scrotum compatible with cellulitis. No discrete abscess is evident. 2. Normal appearance of the testicles bilaterally. 3. Small bilateral hydroceles. 4. Normal pulsed Doppler interrogation within normal waveforms and color Doppler signal. Electronically Signed   By: San Morelle M.D.   On: 03/12/2015 10:40    CBC  Recent Labs Lab 03/12/15 0840 03/13/15 0700  WBC 12.5* 9.0  HGB 15.2 12.2*  HCT 44.7 37.4*  PLT 278 261  MCV 89.8 91.4  MCH 30.5 29.8  MCHC 34.0 32.6  RDW 13.4 13.7    LYMPHSABS 1.8  --   MONOABS 1.5*  --   EOSABS 0.2  --   BASOSABS 0.1  --     Chemistries   Recent Labs Lab 03/12/15 0840 03/12/15 1809 03/13/15 0700  NA 133*  --  135  K 4.1  --  4.5  CL 96*  --  102  CO2 25  --  26  GLUCOSE 551*  --  391*  BUN 18  --  16  CREATININE 0.88  --  0.88  CALCIUM 9.0  --  7.9*  MG  --  1.4*  --   AST  --  16  --   ALT  --  18  --   ALKPHOS  --  61  --   BILITOT  --  0.5  --    ------------------------------------------------------------------------------------------------------------------ estimated creatinine clearance is 118.2 mL/min (by C-G formula based on Cr of 0.88). ------------------------------------------------------------------------------------------------------------------  Recent Labs  03/12/15 0841  HGBA1C 13.8*   ------------------------------------------------------------------------------------------------------------------  Recent Labs  03/13/15 0700  CHOL 183  HDL 24*  LDLCALC 121*  TRIG 191*  CHOLHDL 7.6   ------------------------------------------------------------------------------------------------------------------ No results for input(s): TSH, T4TOTAL, T3FREE, THYROIDAB in the last 72 hours.  Invalid input(s): FREET3 ------------------------------------------------------------------------------------------------------------------ No results for input(s): VITAMINB12, FOLATE, FERRITIN, TIBC, IRON, RETICCTPCT in the last 72 hours.  Coagulation profile No results for input(s): INR, PROTIME in the last 168 hours.  No results for input(s): DDIMER in the last 72 hours.  Cardiac Enzymes No results for input(s): CKMB, TROPONINI, MYOGLOBIN in the last 168 hours.  Invalid input(s): CK ------------------------------------------------------------------------------------------------------------------ Invalid input(s): [place]  03/12/15 0826 03/12/15 1020 03/12/15 1301 03/12/15 1702  03/12/15 2237 03/13/15 0739  GLUCAP 506* 444* 322* 309* 361* 341*     Manuel Knight M.D. [facility] 03/13/2015, 10:59 AM  Pager: [phone] Between 7am to 7pm - call Pager - [phone]  After 7pm go to [email or web address] - password [email or web address]  Call night coverage person covering after 7pm

## 2015-03-14 DIAGNOSIS — Z794 Long term (current) use of insulin: Secondary | ICD-10-CM

## 2015-03-14 DIAGNOSIS — A419 Sepsis, unspecified organism: Secondary | ICD-10-CM

## 2015-03-14 DIAGNOSIS — N492 Inflammatory disorders of scrotum: Principal | ICD-10-CM

## 2015-03-14 DIAGNOSIS — J439 Emphysema, unspecified: Secondary | ICD-10-CM

## 2015-03-14 DIAGNOSIS — E669 Obesity, unspecified: Secondary | ICD-10-CM

## 2015-03-14 DIAGNOSIS — E119 Type 2 diabetes mellitus without complications: Secondary | ICD-10-CM

## 2015-03-14 DIAGNOSIS — I5022 Chronic systolic (congestive) heart failure: Secondary | ICD-10-CM

## 2015-03-14 LAB — CBC
HCT: 41.7 % (ref 39.0–52.0)
Hemoglobin: 13.4 g/dL (ref 13.0–17.0)
MCH: 29.7 pg (ref 26.0–34.0)
MCHC: 32.1 g/dL (ref 30.0–36.0)
MCV: 92.5 fL (ref 78.0–100.0)
PLATELETS: 290 10*3/uL (ref 150–400)
RBC: 4.51 MIL/uL (ref 4.22–5.81)
RDW: 13.7 % (ref 11.5–15.5)
WBC: 10.1 10*3/uL (ref 4.0–10.5)

## 2015-03-14 LAB — BASIC METABOLIC PANEL
Anion gap: 8 (ref 5–15)
BUN: 14 mg/dL (ref 6–20)
CALCIUM: 8.2 mg/dL — AB (ref 8.9–10.3)
CO2: 27 mmol/L (ref 22–32)
CREATININE: 0.83 mg/dL (ref 0.61–1.24)
Chloride: 104 mmol/L (ref 101–111)
Glucose, Bld: 189 mg/dL — ABNORMAL HIGH (ref 65–99)
Potassium: 4.2 mmol/L (ref 3.5–5.1)
SODIUM: 139 mmol/L (ref 135–145)

## 2015-03-14 LAB — GLUCOSE, CAPILLARY
GLUCOSE-CAPILLARY: 221 mg/dL — AB (ref 65–99)
GLUCOSE-CAPILLARY: 137 mg/dL — AB (ref 65–99)
GLUCOSE-CAPILLARY: 164 mg/dL — AB (ref 65–99)
Glucose-Capillary: 178 mg/dL — ABNORMAL HIGH (ref 65–99)

## 2015-03-14 NOTE — Progress Notes (Signed)
[facility]                                                                              Patient Demographics  Manuel Knight, is a 55 y.o. male, DOB - [birth year], [id number]  Admit date - 03/12/2015   Admitting Physician Barton Dubois, MD  Outpatient Primary MD for the patient is [facility]  LOS - 2   Chief Complaint  Patient presents with  . Groin Swelling       Brief HPI   Manuel Knight is a 55 y.o. male with a past medical history significant for hyperlipidemia, COPD, obesity, coronary artery disease, chronic systolic heart failure (last ejection fraction 35%), hypertension, diabetes type 2 (chronically on insulin and oral hypoglycemic regimen) and GERD; who presented to the emergency department secondary to swelling, erythema and pain on his scrotum. Patient reports approximately 3-4 days of discomfort on his scrotum (specifically the left side), with associated erythema, swelling and in the last day prior to admission active, purulent/serosanguineous drainage. Patient has scrotal ultrasound and also CT abdomen and pelvis, both of them rule out the presence of fournier's and confirmed diagnosis of cellulitis with possible early abscess. Patient also has hyperglycemia with CBG's in the 500 range while in ED  Assessment & Plan   Cellulitis, scrotum with sepsis presented on admission with leukocytosis wbc 12.5, hypotension 82/48, lactic acidosis, lactic acid 2.1) -Continue broad-spectrum antibiotics (vancomycin and Zosyn) (h/o mrsa) , s/p gentle hydration, norvasc/clonidine held - Continue pain control, warm compresses  - follow urine culture and blood cx's data, check hiv, urine gc/chlamydia.  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 - Patient counseled on diet and weight control   Anxiety state/depression: - no suicidal ideation or hallucinations -Continue home medications Celexa and as needed xanax  Essential hypertension: hypotension initially, sbp in the 80.s, now low 100's Continue coreg, hold norvasc/clonidine  ADHD: will continue prescribed adderall)  GERD (gastroesophageal reflux disease): -continue PPI  Chronic systolic heart failure ([place]): -Appears to be compensated currently -Last echo in 11/2013 showed EF  35%, strict I's and O's and daily weights -s/p gentle hydration initially, now off ivf, close monitor volume status   Emphysema of lung ([place]) -Will continue as needed nebulizer (albuterol)  -Twice a day Pulmicort and Spiriva  -Patient is currently on room air, no wheezing, stable air movement.  chronic pain syndrome: -Continue home meds oxycodone bid and  PRN analgesics   Code Status: Full code  Family Communication: Discussed in detail with the patient, all imaging results, lab results explained to the patient    Disposition Plan: not  medically ready  Time Spent in minutes   25 minutes  Procedures  None   Consults   urology  DVT Prophylaxis SCD's  Medications  Scheduled Meds: . alprazolam  2 mg Oral QID  . amphetamine-dextroamphetamine  60 mg Oral Daily  . aspirin EC  81 mg Oral Daily  . atorvastatin  80 mg Oral q1800  . budesonide (PULMICORT) nebulizer solution  0.25 mg Nebulization BID  . carvedilol  25 mg Oral BID WC  . citalopram  20 mg Oral Daily  . heparin  5,000 Units Subcutaneous 3 times per day  . insulin aspart  0-15  Units Subcutaneous TID WC  . insulin aspart  8 Units Subcutaneous TID WC  . insulin detemir  20 Units Subcutaneous QHS  . oxyCODONE  10 mg Oral BID  . pantoprazole  40 mg Oral BID  . piperacillin-tazobactam (ZOSYN)  IV  3.375 g Intravenous Q8H  . sacubitril-valsartan  1 tablet Oral BID  . tiotropium  18 mcg Inhalation Daily  . vancomycin  1,500 mg Intravenous Q12H   Continuous Infusions:  PRN Meds:.albuterol, morphine injection, nitroGLYCERIN, ondansetron **OR** ondansetron (ZOFRAN) IV   Antibiotics   Anti-infectives    Start     Dose/Rate Route Frequency Ordered Stop   03/12/15 2000  piperacillin-tazobactam (ZOSYN) IVPB 3.375 g     3.375 g 12.5 mL/hr over 240 Minutes Intravenous Every 8 hours 03/12/15 1817     03/12/15 1830  vancomycin (VANCOCIN) 1,500 mg in sodium chloride 0.9 % 500 mL IVPB     1,500 mg 250 mL/hr over 120 Minutes Intravenous Every 12 hours 03/12/15 1817     03/12/15 1800  vancomycin (VANCOCIN) IVPB 1000 mg/200 mL premix  Status:  Discontinued     1,000 mg 200 mL/hr over 60 Minutes Intravenous  Once 03/12/15 1745 03/12/15 1758   03/12/15 1345  piperacillin-tazobactam (ZOSYN) IVPB 3.375 g     3.375 g 100 mL/hr over 30 Minutes Intravenous  Once 03/12/15 1331 03/12/15 1419   03/12/15 1125  vancomycin (VANCOCIN) 1 GM/200ML IVPB    Comments:  Charmayne Sheer   : cabinet override      03/12/15 1125 03/12/15 1130   03/12/15 1030  vancomycin (VANCOCIN) IVPB 1000 mg/200 mL premix     1,000 mg 200 mL/hr over 60 Minutes Intravenous  Once 03/12/15 1022 03/12/15 1219        Subjective:   Manuel Knight was seen and examined today.  Complaining of ongoing discomfort in the scrotal region. Patient denies dizziness, chest pain, shortness of breath, N/V/D/C, new weakness, numbess, tingling. No acute events overnight.    Objective:   Filed Vitals:   03/13/15 2058 03/13/15 2121 03/14/15 0623 03/14/15 0744  BP:  91/61 100/52   Pulse:  84 80   Temp:  98 F (36.7 C)  97.9 F (36.6 C)   TempSrc:  Oral Oral   Resp:  20 21   Height:      Weight:   114.624 kg (252 lb 11.2 oz)   SpO2: 93% 92% 93% 94%    Intake/Output Summary (Last 24 hours) at 03/14/15 0959 Last data filed at 03/14/15 0656  Gross per 24 hour  Intake    720 ml  Output      4 ml  Net    716 ml     Wt Readings from Last 3 Encounters:  03/14/15 114.624 kg (252 lb 11.2 oz)  01/23/15 112.946 kg (249 lb)  03/02/14 121.11 kg (267 lb)  Exam  General: Alert and oriented x 3, NAD  HEENT:  PERRLA, EOMI, Anicteric Sclera, mucous membranes moist.   Neck: Supple, no JVD, no masses  CVS: S1 S2 auscultated, no rubs, murmurs or gallops. Regular rate and rhythm.  Respiratory: Clear to auscultation bilaterally, no wheezing, rales or rhonchi  Abdomen: Soft, nontender, nondistended, + bowel sounds  Ext: no cyanosis clubbing or edema  Neuro: AAOx3, Cr N's II- XII. Strength 5/5 upper and lower extremities bilaterally  Skin: Erythema, swelling, tender on the left side of the scrotum  Psych: Normal affect and demeanor, alert and oriented x3    Data Review   Micro Results Recent Results (from the past 240 hour(s))  Blood culture (routine x 2)     Status: None (Preliminary result)   Collection Time: 03/12/15 10:48 AM  Result Value Ref Range Status   Specimen Description BLOOD RIGHT ANTECUBITAL  Final   Special Requests BOTTLES DRAWN AEROBIC AND ANAEROBIC 6CC EACH  Final   Culture NO GROWTH 2 DAYS  Final   Report Status PENDING  Incomplete  Blood culture (routine x 2)     Status: None (Preliminary result)   Collection Time: 03/12/15 11:00 AM  Result Value Ref Range Status   Specimen Description BLOOD RIGHT HAND  Final   Special Requests BOTTLES DRAWN AEROBIC AND ANAEROBIC 5CC EACH  Final   Culture NO GROWTH 2 DAYS  Final   Report Status PENDING  Incomplete    Radiology Reports US Scrotum  03/12/2015  CLINICAL DATA:  Left scrotal abscess.  Diabetes. EXAM: SCROTAL ULTRASOUND  DOPPLER ULTRASOUND OF THE TESTICLES TECHNIQUE: Complete ultrasound examination of the testicles, epididymis, and other scrotal structures was performed. Color and spectral Doppler ultrasound were also utilized to evaluate blood flow to the testicles. COMPARISON:  None. FINDINGS: Right testicle Measurements: 3.9 x 2.3 x 2.7 cm, within normal limits. No mass or microlithiasis visualized. Left testicle Measurements: 4.1 x 2.3 x 2.6 cm, within normal limits. No mass or microlithiasis visualized. Right epididymis:  Normal in size and appearance. Left epididymis:  Normal in size and appearance. Hydrocele: Small bilateral hydroceles are present. These appear simple. Varicocele:  None visualized. Pulsed Doppler interrogation of both testes demonstrates normal low resistance arterial and venous waveforms bilaterally. There is a complex hyperechoic area along the posterior aspect of the scrotum, more prominent on the left. Scrotal skin thickening measures up to 1.1 cm. IMPRESSION: 1. Complex hyperechoic skin thickening along the posterior aspect of the scrotum compatible with cellulitis. No discrete abscess is evident. 2. Normal appearance of the testicles bilaterally. 3. Small bilateral hydroceles. 4. Normal pulsed Doppler interrogation within normal waveforms and color Doppler signal. Electronically Signed   By: San Morelle M.D.   On: 03/12/2015 10:40   Ct Abdomen Pelvis W Contrast  03/12/2015  CLINICAL DATA:  Left scrotal pain and swelling for 4 days, opened and drained last night, status post appendectomy EXAM: CT ABDOMEN AND PELVIS WITH CONTRAST TECHNIQUE: Multidetector CT imaging of the abdomen and pelvis was performed using the standard protocol following bolus administration of intravenous contrast. CONTRAST:  163mL OMNIPAQUE IOHEXOL 300 MG/ML  SOLN COMPARISON:  Scrotal ultrasound 03/12/2015 and CT scan abdomen and pelvis 08/10/2010 FINDINGS: Sagittal images of the spine shows mild degenerative changes  thoracolumbar spine. There is small right pleural effusion with right base posterior atelectasis. Significant fatty infiltration of the liver again noted. No focal hepatic mass. No calcified gallstones are noted within gallbladder. A distal duodenum diverticulum containing some air and  contrast material again noted without significant change from prior exam. Enhanced pancreas, spleen and adrenal glands are unremarkable. Enhanced kidneys are symmetrical in size. No hydronephrosis or hydroureter. Delayed renal images shows bilateral renal symmetrical excretion. Tiny hiatal hernia is noted. Atherosclerotic calcifications of abdominal aorta and iliac arteries. No aortic aneurysm. No small bowel obstruction.  No ascites or free air.  No adenopathy. There is no pericecal inflammation. The patient is status post appendectomy. Some colonic stool and gas noted in right colon and transverse colon. No colonic obstruction. No diverticulitis. Few diverticula are noted proximal sigmoid colon. No acute diverticulitis. Some colonic gas and stool noted in sigmoid colon and rectum. Prostate gland calcifications are noted. The the As noted on recent scrotal ultrasound there is skin thickening and subcutaneous edema and stranding in left posterior scrotum/anterior perineum. Small irregular fluid noted in left posterior scrotum axial image 106 superficial measures about 1.7 cm. This corresponds to focal hyperechoic area noted on ultrasound. This is highly suspicious for focal cellulitis and subcutaneous edema. Early subcutaneous abscess cannot be excluded. Clinical correlation is necessary. There is no evidence of subcutaneous air or air-fluid level. Small bilateral hydrocele is noted. A right inguinal lymph node measures 1.2 cm in diameter. Left inguinal lymph node measures 1.2 cm in diameter. These are borderline enlarged by size criteria probable reactive. IMPRESSION: 1. As noted on recent scrotal ultrasound there is skin thickening  and subcutaneous edema and stranding in left posterior scrotum/anterior perineum. Small subcutaneous irregular fluid noted in left posterior scrotum axial image 106 measures about 1.7 cm. This corresponds to focal hyperechoic area noted on ultrasound. This is highly suspicious for focal cellulitis and subcutaneous edema. Early subcutaneous abscess cannot be excluded. Clinical correlation is necessary. There is no evidence of subcutaneous air or air-fluid level. 2. Small bilateral hydrocele. 3. Borderline bilateral inguinal enlarged lymph nodes probable reactive. 4. Again noted significant fatty infiltration of the liver. 5. No hydronephrosis or hydroureter. 6. Small right pleural effusion with right basilar atelectasis. 7. Stable distal duodenum diverticulum. Electronically Signed   By: Lahoma Crocker M.D.   On: 03/12/2015 12:39   [place] Art/ven Flow Abd Pelv Doppler  03/12/2015  CLINICAL DATA:  Left scrotal abscess.  Diabetes. EXAM: SCROTAL ULTRASOUND DOPPLER ULTRASOUND OF THE TESTICLES TECHNIQUE: Complete ultrasound examination of the testicles, epididymis, and other scrotal structures was performed. Color and spectral Doppler ultrasound were also utilized to evaluate blood flow to the testicles. COMPARISON:  None. FINDINGS: Right testicle Measurements: 3.9 x 2.3 x 2.7 cm, within normal limits. No mass or microlithiasis visualized. Left testicle Measurements: 4.1 x 2.3 x 2.6 cm, within normal limits. No mass or microlithiasis visualized. Right epididymis:  Normal in size and appearance. Left epididymis:  Normal in size and appearance. Hydrocele: Small bilateral hydroceles are present. These appear simple. Varicocele:  None visualized. Pulsed Doppler interrogation of both testes demonstrates normal low resistance arterial and venous waveforms bilaterally. There is a complex hyperechoic area along the posterior aspect of the scrotum, more prominent on the left. Scrotal skin thickening measures up to 1.1 cm. IMPRESSION:  1. Complex hyperechoic skin thickening along the posterior aspect of the scrotum compatible with cellulitis. No discrete abscess is evident. 2. Normal appearance of the testicles bilaterally. 3. Small bilateral hydroceles. 4. Normal pulsed Doppler interrogation within normal waveforms and color Doppler signal. Electronically Signed   By: San Morelle M.D.   On: 03/12/2015 10:40    CBC  Recent Labs Lab 03/12/15 0840 03/13/15 0700 03/14/15 [id number]  WBC 12.5* 9.0 10.1  HGB 15.2 12.2* 13.4  HCT 44.7 37.4* 41.7  PLT 278 261 290  MCV 89.8 91.4 92.5  MCH 30.5 29.8 29.7  MCHC 34.0 32.6 32.1  RDW 13.4 13.7 13.7  LYMPHSABS 1.8  --   --   MONOABS 1.5*  --   --   EOSABS 0.2  --   --   BASOSABS 0.1  --   --     Chemistries   Recent Labs Lab 03/12/15 0840 03/12/15 1809 03/13/15 0700 03/14/15 0611  NA 133*  --  135 139  K 4.1  --  4.5 4.2  CL 96*  --  102 104  CO2 25  --  26 27  GLUCOSE 551*  --  391* 189*  BUN 18  --  16 14  CREATININE 0.88  --  0.88 0.83  CALCIUM 9.0  --  7.9* 8.2*  MG  --  1.4*  --   --   AST  --  16  --   --   ALT  --  18  --   --   ALKPHOS  --  61  --   --   BILITOT  --  0.5  --   --    ------------------------------------------------------------------------------------------------------------------ estimated creatinine clearance is 125.6 mL/min (by C-G formula based on Cr of 0.83). ------------------------------------------------------------------------------------------------------------------  Recent Labs  03/12/15 0841  HGBA1C 13.8*   ------------------------------------------------------------------------------------------------------------------  Recent Labs  03/13/15 0700  CHOL 183  HDL 24*  LDLCALC 121*  TRIG 191*  CHOLHDL 7.6   ------------------------------------------------------------------------------------------------------------------ No results for input(s): TSH, T4TOTAL, T3FREE, THYROIDAB in the last 72 hours.  Invalid  input(s): FREET3 ------------------------------------------------------------------------------------------------------------------ No results for input(s): VITAMINB12, FOLATE, FERRITIN, TIBC, IRON, RETICCTPCT in the last 72 hours.  Coagulation profile No results for input(s): INR, PROTIME in the last 168 hours.  No results for input(s): DDIMER in the last 72 hours.  Cardiac Enzymes No results for input(s): CKMB, TROPONINI, MYOGLOBIN in the last 168 hours.  Invalid input(s): CK ------------------------------------------------------------------------------------------------------------------ Invalid input(s): [place]  03/12/15 2237 03/13/15 0739 03/13/15 1232 03/13/15 1722 03/13/15 2124 03/14/15 0720  GLUCAP 361* 341* 281* 119* 130* 178*     Navjot Loera M.D. Ph.D. [facility] 03/14/2015, 9:59 AM Pager: [phone]   After 7pm go to [email or web address] - password [email or web address]  Call night coverage person covering after 7pm

## 2015-03-15 LAB — CBC
HCT: 45.5 % (ref 39.0–52.0)
Hemoglobin: 14.5 g/dL (ref 13.0–17.0)
MCH: 29.4 pg (ref 26.0–34.0)
MCHC: 31.9 g/dL (ref 30.0–36.0)
MCV: 92.3 fL (ref 78.0–100.0)
PLATELETS: 307 10*3/uL (ref 150–400)
RBC: 4.93 MIL/uL (ref 4.22–5.81)
RDW: 13.8 % (ref 11.5–15.5)
WBC: 9.8 10*3/uL (ref 4.0–10.5)

## 2015-03-15 LAB — BASIC METABOLIC PANEL
Anion gap: 10 (ref 5–15)
BUN: 13 mg/dL (ref 6–20)
CHLORIDE: 105 mmol/L (ref 101–111)
CO2: 27 mmol/L (ref 22–32)
CREATININE: 0.98 mg/dL (ref 0.61–1.24)
Calcium: 8.3 mg/dL — ABNORMAL LOW (ref 8.9–10.3)
GFR calc non Af Amer: >60 mL/min (ref >60)
GLUCOSE: 132 mg/dL — AB (ref 65–99)
Potassium: 4 mmol/L (ref 3.5–5.1)
Sodium: 142 mmol/L (ref 135–145)

## 2015-03-15 LAB — GLUCOSE, CAPILLARY: Glucose-Capillary: 125 mg/dL — ABNORMAL HIGH (ref 65–99)

## 2015-03-15 LAB — MAGNESIUM: Magnesium: 1.5 mg/dL — ABNORMAL LOW (ref 1.7–2.4)

## 2015-03-15 LAB — MRSA PCR SCREENING: MRSA by PCR: NEGATIVE

## 2015-03-15 MED ORDER — DOXYCYCLINE HYCLATE 100 MG PO TABS: 100.0000 mg | ORAL_TABLET | Freq: Two times a day (BID) | ORAL | Status: DC

## 2015-03-15 NOTE — Care Management Important Message (Signed)
Important Message  Patient Details  Name: Miachel Kirchmeier MRN: [id number] Date of Birth: 1960/10/06   Medicare Important Message Given:  Yes    Sherald Barge, RN 03/15/2015, 9:47 AM

## 2015-03-15 NOTE — Progress Notes (Signed)
Down via wheelchair accompanied by staff for discharge home in care of family.  Stable at discharge. 

## 2015-03-15 NOTE — Care Management Note (Signed)
Case Management Note  Patient Details  Name: Manuel Knight MRN: [id number] Date of Birth: October 23, 1960  Subjective/Objective:                  Pt is from home, lives with wife and is ind with ADL's. Pt plans to return home with self care today.   Action/Plan: No CM needs.   Expected Discharge Date:     03/15/2015             Expected Discharge Plan:  Home/Self Care  In-House Referral:  NA  Discharge planning Services  CM Consult  Post Acute Care Choice:  NA Choice offered to:  NA  DME Arranged:    DME Agency:     HH Arranged:    HH Agency:     Status of Service:  Completed, signed off  Medicare Important Message Given:  Yes Date Medicare IM Given:    Medicare IM give by:    Date Additional Medicare IM Given:    Additional Medicare Important Message give by:     If discussed at [place] of Stay Meetings, dates discussed:    Additional Comments:  Sherald Barge, RN 03/15/2015, 9:47 AM

## 2015-03-15 NOTE — Progress Notes (Signed)
IV access removed.  Discharge instructions reviewed with patient, questions answered, understanding verbalized.

## 2015-03-15 NOTE — Discharge Summary (Addendum)
Physician Discharge Summary   Patient ID: Manuel Knight MRN: [id number] DOB/AGE: [birth year] 55 y.o.  Admit date: 03/12/2015 Discharge date: 03/15/2015  Primary Care Physician:  [facility]  Discharge Diagnoses:    . Cellulitis, scrotum . Obesity, unspecified . GERD (gastroesophageal reflux disease) . Essential hypertension . HLD (hyperlipidemia) . Chronic systolic heart failure ([place]) . Anxiety state . Emphysema of lung ([place])   Consults:  None  Recommendations for Outpatient Follow-up:  1. Patient will need outpatient referral to urology 2. Please repeat CBC/BMET at next visit 3.    DIET: Heart healthy diet    Allergies:   Allergies  Allergen Reactions  . Tylenol [Acetaminophen] Hives  . Lodine [Etodolac] Swelling and Rash    Rash and swelling   . Propoxyphene N-Acetaminophen Other (See Comments)    unknown     DISCHARGE MEDICATIONS: Current Discharge Medication List    START taking these medications   Details  doxycycline (VIBRA-TABS) 100 MG tablet Take 1 tablet (100 mg total) by mouth 2 (two) times daily. X 10days Qty: 20 tablet, Refills: 0      CONTINUE these medications which have NOT CHANGED   Details  alprazolam (XANAX) 2 MG tablet Take 1 tablet by mouth 4 (four) times daily.    amphetamine-dextroamphetamine (ADDERALL) 20 MG tablet Take 60 mg by mouth daily.    aspirin EC 81 MG tablet Take 1 tablet (81 mg total) by mouth daily.    atorvastatin (LIPITOR) 80 MG tablet Take 1 tablet by mouth daily.    Canagliflozin (INVOKANA) 100 MG TABS Take 100 mg by mouth daily.    carvedilol (COREG) 25 MG tablet Take 1 tablet (25 mg total) by mouth 2 (two) times daily. Qty: 180 tablet, Refills: 3    citalopram (CELEXA) 20 MG tablet Take 1 tablet by mouth daily.    Fluticasone-Salmeterol (ADVAIR DISKUS) 500-50 MCG/DOSE AEPB Inhale 1 puff into the lungs every 12 (twelve) hours.      insulin aspart (NOVOLOG FLEXPEN) 100 UNIT/ML  FlexPen Inject 0-26 Units into the skin 3 (three) times daily with meals. 20 units three times a day with sliding scale max of 26 units    metFORMIN (GLUCOPHAGE) 1000 MG tablet Take 1,000 mg by mouth 2 (two) times daily with a meal.    nitroGLYCERIN (NITROSTAT) 0.4 MG SL tablet Place 1 tablet (0.4 mg total) under the tongue every 5 (five) minutes as needed for chest pain. Qty: 90 tablet, Refills: 3    Oxycodone HCl 10 MG TABS Take 1 tablet by mouth 2 (two) times daily.    pantoprazole (PROTONIX) 40 MG tablet TAKE ONE TABLET BY MOUTH TWICE DAILY Qty: 60 tablet, Refills: 3    PROAIR HFA 108 (90 BASE) MCG/ACT inhaler Inhale 2 puffs into the lungs every 4 (four) hours as needed.    sacubitril-valsartan (ENTRESTO) 97-103 MG Take 1 tablet by mouth 2 (two) times daily. Qty: 60 tablet, Refills: 6    tiotropium (SPIRIVA) 18 MCG inhalation capsule Place 18 mcg into inhaler and inhale daily.        STOP taking these medications     amLODipine (NORVASC) 10 MG tablet      cloNIDine (CATAPRES) 0.3 MG tablet          Brief H and P: For complete details please refer to admission H and P, but in brief [name] is a 55 y.o. male with a past medical history significant for hyperlipidemia, COPD, obesity, coronary artery disease, chronic  systolic heart failure (last ejection fraction 35%), hypertension, diabetes type 2 (chronically on insulin and oral hypoglycemic regimen) and GERD; who presented to the emergency department secondary to swelling, erythema and pain on his scrotum. Patient reports approximately 3-4 days of discomfort on his scrotum (specifically the left side), with associated erythema, swelling and in the last day prior to admission active, purulent/serosanguineous drainage. Patient has scrotal ultrasound and also CT abdomen and pelvis, both of them rule out the presence of fournier's and confirmed diagnosis of cellulitis with possible early abscess. Patient also has hyperglycemia  with CBG's in the 500 range while in ED.  Hospital Course:  Cellulitis, scrotum: Currently improved Patient was placed on broad-spectrum antibiotics with IV vancomycin and Zosyn., Pain control and warm compresses  Blood cultures and the remained negative to date. Cellulitis has significantly improved, patient transitioned to oral doxycycline for another 10 days to complete full course. He was recommended to follow-up with his PCP and urology referral.  Type 2 diabetes mellitus with hyperglycemia, with long-term current use of insulin ([place]): Poorly controlled and worsened likely due to acute infection, #1 -  Hemoglobin A1c 13.8 - Patient was placed on sliding scale insulin and Levemir while inpatient. Patient however reported his blood sugars run controlled at home about 101. He was recommended to check his glucometer at home see if it is broken. For now we will continue his outpatient schedule and he will follow-up with his PCP ASAP regarding adjustment of his insulin and oral hypoglycemics. I would recommend strongly to start patient on basal insulin and sliding scale insulin and discontinue oral hypoglycemics outpatient.   HLD (hyperlipidemia): -Continue statins -LDL 121  Obesity, unspecified -Body mass index is 35.72 kg/(m^2). - Patient counseled on diet and weight control   Anxiety state/depression: - no suicidal ideation or hallucinations -Continue home medications Celexa and as needed xanax  Essential hypertension: Stable overall BP was somewhat soft, likely due to #1, patient recommended to hold amlodipine and clonidine   ADHD: will continue prescribed adderall)  GERD (gastroesophageal reflux disease): -continue PPI  Chronic systolic heart failure ([place]): -Appears to be compensated currently -Last echo in 11/2013 showed EF 35%   Emphysema of lung ([place]) -Will continue as needed nebulizer (albuterol), Spiriva, started on the symbicort - Stable, on room air   chronic pain  syndrome: -Continue PRN analgesics    Day of Discharge BP 103/53 mmHg  Pulse 76  Temp(Src) 97.5 F (36.4 C) (Oral)  Resp 21  Ht 5\' 9"  (1.753 m)  Wt 114 kg (251 lb 5.2 oz)  BMI 37.10 kg/m2  SpO2 97%  Physical Exam: General: Alert and awake oriented x3 not in any acute distress. HEENT: anicteric sclera, pupils reactive to light and accommodation CVS: S1-S2 clear no murmur rubs or gallops Chest: clear to auscultation bilaterally, no wheezing rales or rhonchi Abdomen: soft nontender, nondistended, normal bowel sounds Extremities: no cyanosis, clubbing or edema noted bilaterally Neuro: Cranial nerves II-XII intact, no focal neurological deficits   The results of significant diagnostics from this hospitalization (including imaging, microbiology, ancillary and laboratory) are listed below for reference.    LAB RESULTS: Basic Metabolic Panel:  Recent Labs Lab 03/12/15 1809  03/14/15 0611 03/15/15 0633  NA  --   < > 139 142  K  --   < > 4.2 4.0  CL  --   < > 104 105  CO2  --   < > 27 27  GLUCOSE  --   < > 189*  132*  BUN  --   < > 14 13  CREATININE  --   < > 0.83 0.98  CALCIUM  --   < > 8.2* 8.3*  MG 1.4*  --   --  1.5*  PHOS 3.2  --   --   --   < > = values in this interval not displayed. Liver Function Tests:  Recent Labs Lab 03/12/15 1809  AST 16  ALT 18  ALKPHOS 61  BILITOT 0.5  PROT 6.2*  ALBUMIN 3.0*   No results for input(s): LIPASE, AMYLASE in the last 168 hours. No results for input(s): AMMONIA in the last 168 hours. CBC:  Recent Labs Lab 03/12/15 0840  03/14/15 0611 03/15/15 0633  WBC 12.5*  < > 10.1 9.8  NEUTROABS 9.0*  --   --   --   HGB 15.2  < > 13.4 14.5  HCT 44.7  < > 41.7 45.5  MCV 89.8  < > 92.5 92.3  PLT 278  < > 290 307  < > = values in this interval not displayed. Cardiac Enzymes: No results for input(s): CKTOTAL, CKMB, CKMBINDEX, TROPONINI in the last 168 hours. BNP: Invalid input(s): POCBNP CBG:  Recent Labs Lab  03/14/15 1953 03/15/15 0713  GLUCAP 164* 125*    Significant Diagnostic Studies:  US Scrotum  03/12/2015  CLINICAL DATA:  Left scrotal abscess.  Diabetes. EXAM: SCROTAL ULTRASOUND DOPPLER ULTRASOUND OF THE TESTICLES TECHNIQUE: Complete ultrasound examination of the testicles, epididymis, and other scrotal structures was performed. Color and spectral Doppler ultrasound were also utilized to evaluate blood flow to the testicles. COMPARISON:  None. FINDINGS: Right testicle Measurements: 3.9 x 2.3 x 2.7 cm, within normal limits. No mass or microlithiasis visualized. Left testicle Measurements: 4.1 x 2.3 x 2.6 cm, within normal limits. No mass or microlithiasis visualized. Right epididymis:  Normal in size and appearance. Left epididymis:  Normal in size and appearance. Hydrocele: Small bilateral hydroceles are present. These appear simple. Varicocele:  None visualized. Pulsed Doppler interrogation of both testes demonstrates normal low resistance arterial and venous waveforms bilaterally. There is a complex hyperechoic area along the posterior aspect of the scrotum, more prominent on the left. Scrotal skin thickening measures up to 1.1 cm. IMPRESSION: 1. Complex hyperechoic skin thickening along the posterior aspect of the scrotum compatible with cellulitis. No discrete abscess is evident. 2. Normal appearance of the testicles bilaterally. 3. Small bilateral hydroceles. 4. Normal pulsed Doppler interrogation within normal waveforms and color Doppler signal. Electronically Signed   By: San Morelle M.D.   On: 03/12/2015 10:40   Ct Abdomen Pelvis W Contrast  03/12/2015  CLINICAL DATA:  Left scrotal pain and swelling for 4 days, opened and drained last night, status post appendectomy EXAM: CT ABDOMEN AND PELVIS WITH CONTRAST TECHNIQUE: Multidetector CT imaging of the abdomen and pelvis was performed using the standard protocol following bolus administration of intravenous contrast. CONTRAST:  121mL  OMNIPAQUE IOHEXOL 300 MG/ML  SOLN COMPARISON:  Scrotal ultrasound 03/12/2015 and CT scan abdomen and pelvis 08/10/2010 FINDINGS: Sagittal images of the spine shows mild degenerative changes thoracolumbar spine. There is small right pleural effusion with right base posterior atelectasis. Significant fatty infiltration of the liver again noted. No focal hepatic mass. No calcified gallstones are noted within gallbladder. A distal duodenum diverticulum containing some air and contrast material again noted without significant change from prior exam. Enhanced pancreas, spleen and adrenal glands are unremarkable. Enhanced kidneys are symmetrical in size. No hydronephrosis or hydroureter.  Delayed renal images shows bilateral renal symmetrical excretion. Tiny hiatal hernia is noted. Atherosclerotic calcifications of abdominal aorta and iliac arteries. No aortic aneurysm. No small bowel obstruction.  No ascites or free air.  No adenopathy. There is no pericecal inflammation. The patient is status post appendectomy. Some colonic stool and gas noted in right colon and transverse colon. No colonic obstruction. No diverticulitis. Few diverticula are noted proximal sigmoid colon. No acute diverticulitis. Some colonic gas and stool noted in sigmoid colon and rectum. Prostate gland calcifications are noted. The the As noted on recent scrotal ultrasound there is skin thickening and subcutaneous edema and stranding in left posterior scrotum/anterior perineum. Small irregular fluid noted in left posterior scrotum axial image 106 superficial measures about 1.7 cm. This corresponds to focal hyperechoic area noted on ultrasound. This is highly suspicious for focal cellulitis and subcutaneous edema. Early subcutaneous abscess cannot be excluded. Clinical correlation is necessary. There is no evidence of subcutaneous air or air-fluid level. Small bilateral hydrocele is noted. A right inguinal lymph node measures 1.2 cm in diameter. Left  inguinal lymph node measures 1.2 cm in diameter. These are borderline enlarged by size criteria probable reactive. IMPRESSION: 1. As noted on recent scrotal ultrasound there is skin thickening and subcutaneous edema and stranding in left posterior scrotum/anterior perineum. Small subcutaneous irregular fluid noted in left posterior scrotum axial image 106 measures about 1.7 cm. This corresponds to focal hyperechoic area noted on ultrasound. This is highly suspicious for focal cellulitis and subcutaneous edema. Early subcutaneous abscess cannot be excluded. Clinical correlation is necessary. There is no evidence of subcutaneous air or air-fluid level. 2. Small bilateral hydrocele. 3. Borderline bilateral inguinal enlarged lymph nodes probable reactive. 4. Again noted significant fatty infiltration of the liver. 5. No hydronephrosis or hydroureter. 6. Small right pleural effusion with right basilar atelectasis. 7. Stable distal duodenum diverticulum. Electronically Signed   By: Lahoma Crocker M.D.   On: 03/12/2015 12:39   [place] Art/ven Flow Abd Pelv Doppler  03/12/2015  CLINICAL DATA:  Left scrotal abscess.  Diabetes. EXAM: SCROTAL ULTRASOUND DOPPLER ULTRASOUND OF THE TESTICLES TECHNIQUE: Complete ultrasound examination of the testicles, epididymis, and other scrotal structures was performed. Color and spectral Doppler ultrasound were also utilized to evaluate blood flow to the testicles. COMPARISON:  None. FINDINGS: Right testicle Measurements: 3.9 x 2.3 x 2.7 cm, within normal limits. No mass or microlithiasis visualized. Left testicle Measurements: 4.1 x 2.3 x 2.6 cm, within normal limits. No mass or microlithiasis visualized. Right epididymis:  Normal in size and appearance. Left epididymis:  Normal in size and appearance. Hydrocele: Small bilateral hydroceles are present. These appear simple. Varicocele:  None visualized. Pulsed Doppler interrogation of both testes demonstrates normal low resistance arterial and  venous waveforms bilaterally. There is a complex hyperechoic area along the posterior aspect of the scrotum, more prominent on the left. Scrotal skin thickening measures up to 1.1 cm. IMPRESSION: 1. Complex hyperechoic skin thickening along the posterior aspect of the scrotum compatible with cellulitis. No discrete abscess is evident. 2. Normal appearance of the testicles bilaterally. 3. Small bilateral hydroceles. 4. Normal pulsed Doppler interrogation within normal waveforms and color Doppler signal. Electronically Signed   By: San Morelle M.D.   On: 03/12/2015 10:40    2D ECHO:   Disposition and Follow-up: Discharge Instructions    Diet Carb Modified    Complete by:  As directed      Discharge instructions    Complete by:  As directed  Please continue warm compresses to the scrotal until cellulitis resolved  Please HOLD norvasc and clonidine until you follow-up with your doctor   It is VERY IMPORTANT that you follow up with a PCP on a regular basis.  Check your blood glucoses before each meal and at bedtime and maintain a log of your readings.  Bring this log with you when you follow up with your PCP so that he or she can adjust your insulin at your follow up visit.     Increase activity slowly    Complete by:  As directed             DISPOSITION:   home   DISCHARGE FOLLOW-UP Follow-up Information    Follow up with [facility]. Schedule an appointment as soon as possible for a visit in 1 week.   Why:  for hospital follow-up   Contact information:   [address] [place] [address] [phone]        Time spent on Discharge: 28mins   Signed:   Olen Eaves M.D. [facility] 03/15/2015, 9:28 AM Pager: [phone]  Addendum: Coding query Sepsis ruled out   Renne Cornick M.D. [facility] 03/19/2015, 5:50 PM  Pager: [phone]

## 2015-03-16 LAB — HIV ANTIBODY (ROUTINE TESTING W REFLEX): HIV SCREEN 4TH GENERATION: NONREACTIVE

## 2015-03-18 LAB — CULTURE, BLOOD (ROUTINE X 2)
CULTURE: NO GROWTH
CULTURE: NO GROWTH

## 2015-03-19 ENCOUNTER — Ambulatory Visit: Payer: Medicare Other | Admitting: Internal Medicine

## 2015-03-19 ENCOUNTER — Encounter: Payer: Self-pay | Admitting: Internal Medicine

## 2015-05-16 ENCOUNTER — Telehealth: Payer: Self-pay | Admitting: *Deleted

## 2015-05-16 NOTE — Telephone Encounter (Signed)
sw pt informed him that this appt for 5.25.17 will need to be cancelled. I explained the process and made him aware that I will give him a call when a doctor approves him.I apologized several of times for this mistake.Marland Kitchentd

## 2015-05-17 ENCOUNTER — Other Ambulatory Visit: Payer: Self-pay | Admitting: Cardiovascular Disease

## 2015-05-30 ENCOUNTER — Telehealth: Payer: Self-pay | Admitting: *Deleted

## 2015-05-30 NOTE — Telephone Encounter (Signed)
Returned pt call made him aware that his referral is still in review with Dr. Dossie Arbour. Pt is aware .Marland KitchenMarland KitchenTD

## 2015-06-06 ENCOUNTER — Ambulatory Visit: Payer: Medicare Other | Admitting: Anesthesiology

## 2015-06-13 ENCOUNTER — Other Ambulatory Visit
Admission: RE | Admit: 2015-06-13 | Discharge: 2015-06-13 | Disposition: A | Discharge status: Home / Self Care | Payer: Medicare Other | Source: Ambulatory Visit | Attending: Pain Medicine | Admitting: Pain Medicine

## 2015-06-13 ENCOUNTER — Ambulatory Visit
Admission: RE | Admit: 2015-06-13 | Discharge: 2015-06-13 | Disposition: A | Discharge status: Home / Self Care | Payer: Medicare Other | Source: Ambulatory Visit | Attending: Pain Medicine | Admitting: Pain Medicine

## 2015-06-13 ENCOUNTER — Encounter: Payer: Self-pay | Admitting: Pain Medicine

## 2015-06-13 ENCOUNTER — Ambulatory Visit (HOSPITAL_BASED_OUTPATIENT_CLINIC_OR_DEPARTMENT_OTHER): Payer: Medicare Other | Admitting: Pain Medicine

## 2015-06-13 ENCOUNTER — Encounter (INDEPENDENT_AMBULATORY_CARE_PROVIDER_SITE_OTHER): Payer: Self-pay

## 2015-06-13 VITALS — BP 117/64 | HR 70 | Temp 97.9°F | Resp 16 | Ht 69.0 in | Wt 220.0 lb

## 2015-06-13 DIAGNOSIS — E559 Vitamin D deficiency, unspecified: Secondary | ICD-10-CM

## 2015-06-13 DIAGNOSIS — M25569 Pain in unspecified knee: Secondary | ICD-10-CM | POA: Insufficient documentation

## 2015-06-13 DIAGNOSIS — G8929 Other chronic pain: Secondary | ICD-10-CM | POA: Insufficient documentation

## 2015-06-13 DIAGNOSIS — M533 Sacrococcygeal disorders, not elsewhere classified: Secondary | ICD-10-CM

## 2015-06-13 DIAGNOSIS — M160 Bilateral primary osteoarthritis of hip: Secondary | ICD-10-CM | POA: Insufficient documentation

## 2015-06-13 DIAGNOSIS — M47816 Spondylosis without myelopathy or radiculopathy, lumbar region: Secondary | ICD-10-CM

## 2015-06-13 DIAGNOSIS — F1190 Opioid use, unspecified, uncomplicated: Secondary | ICD-10-CM | POA: Insufficient documentation

## 2015-06-13 DIAGNOSIS — M545 Low back pain, unspecified: Secondary | ICD-10-CM

## 2015-06-13 DIAGNOSIS — Z0189 Encounter for other specified special examinations: Secondary | ICD-10-CM | POA: Diagnosis not present

## 2015-06-13 DIAGNOSIS — R209 Unspecified disturbances of skin sensation: Secondary | ICD-10-CM

## 2015-06-13 DIAGNOSIS — M25561 Pain in right knee: Secondary | ICD-10-CM

## 2015-06-13 DIAGNOSIS — M5416 Radiculopathy, lumbar region: Secondary | ICD-10-CM | POA: Insufficient documentation

## 2015-06-13 DIAGNOSIS — M79673 Pain in unspecified foot: Secondary | ICD-10-CM

## 2015-06-13 DIAGNOSIS — Z5181 Encounter for therapeutic drug level monitoring: Secondary | ICD-10-CM | POA: Insufficient documentation

## 2015-06-13 DIAGNOSIS — E1142 Type 2 diabetes mellitus with diabetic polyneuropathy: Secondary | ICD-10-CM

## 2015-06-13 DIAGNOSIS — M79604 Pain in right leg: Secondary | ICD-10-CM

## 2015-06-13 DIAGNOSIS — M79605 Pain in left leg: Secondary | ICD-10-CM

## 2015-06-13 DIAGNOSIS — I214 Non-ST elevation (NSTEMI) myocardial infarction: Secondary | ICD-10-CM

## 2015-06-13 DIAGNOSIS — M170 Bilateral primary osteoarthritis of knee: Secondary | ICD-10-CM | POA: Diagnosis not present

## 2015-06-13 DIAGNOSIS — M79606 Pain in leg, unspecified: Secondary | ICD-10-CM

## 2015-06-13 DIAGNOSIS — Z87898 Personal history of other specified conditions: Secondary | ICD-10-CM | POA: Insufficient documentation

## 2015-06-13 DIAGNOSIS — F1291 Cannabis use, unspecified, in remission: Secondary | ICD-10-CM

## 2015-06-13 DIAGNOSIS — M25562 Pain in left knee: Secondary | ICD-10-CM

## 2015-06-13 LAB — COMPREHENSIVE METABOLIC PANEL
ALK PHOS: 58 U/L (ref 38–126)
ALT: 19 U/L (ref 17–63)
ANION GAP: 6 (ref 5–15)
AST: 17 U/L (ref 15–41)
Albumin: 4 g/dL (ref 3.5–5.0)
BUN: 15 mg/dL (ref 6–20)
CO2: 25 mmol/L (ref 22–32)
CREATININE: 0.98 mg/dL (ref 0.61–1.24)
Calcium: 9.4 mg/dL (ref 8.9–10.3)
Chloride: 107 mmol/L (ref 101–111)
Glucose, Bld: 140 mg/dL — ABNORMAL HIGH (ref 65–99)
Potassium: 4.6 mmol/L (ref 3.5–5.1)
Sodium: 138 mmol/L (ref 135–145)
Total Bilirubin: 0.2 mg/dL — ABNORMAL LOW (ref 0.3–1.2)
Total Protein: 6.9 g/dL (ref 6.5–8.1)

## 2015-06-13 LAB — SEDIMENTATION RATE: SED RATE: 2 mm/h (ref 0–20)

## 2015-06-13 LAB — C-REACTIVE PROTEIN: CRP: 0.8 mg/dL (ref <1.0)

## 2015-06-13 LAB — VITAMIN B12: Vitamin B-12: 1002 pg/mL — ABNORMAL HIGH (ref 180–914)

## 2015-06-13 LAB — MAGNESIUM: MAGNESIUM: 1.4 mg/dL — AB (ref 1.7–2.4)

## 2015-06-13 NOTE — Progress Notes (Signed)
Patient's Name: Manuel Knight  Patient type: New patient  MRN: [id number]  Service setting: Ambulatory outpatient  DOB: 23-Jul-1960  Location: [facility]  DOS: 06/13/2015  Primary Care Physician: Manuel Knight  Note by: Manuel Knight, M.D, DABA, DABAPM, DABPM, [facility], FIPP  Referring Physician: Leonie Douglas, MD  Specialty: Board-Certified Interventional Pain Management     Primary Reason(s) for Visit: Initial Patient Evaluation CC: Back Pain and Knee Pain   HPI  Manuel Knight is a 55 y.o. year old, male patient, who comes today for an initial evaluation. He has Type 2 diabetes mellitus with hyperglycemia, with long-term current use of insulin ([facility]); HLD (hyperlipidemia); Obesity, unspecified; Anxiety state; Essential hypertension; COPD; PULMONARY NODULE; HERNIA; GERD (gastroesophageal reflux disease); Esophageal dysphagia; Chronic diarrhea; Accelerated hypertension; NSTEMI (non-ST elevated myocardial infarction) ([facility]); CAD (coronary artery disease), native coronary artery; S/P CABG x 3; Chronic systolic heart failure (Man[facility]); Emphysema of lung ([facility]); Disorder of joint of spine; H/O coronary artery bypass surgery; Chronic pain; Encounter for pain management planning; Encounter for therapeutic drug level monitoring; Opiate use (90 MME/Day); Chronic low back pain (Location of Secondary source of pain) (Bilateral) (L>R); Chronic knee pain (Location of Primary Source of Pain) (Bilateral) (L>R); Chronic foot pain (Bilateral) (L>R); Avitaminosis D; Disturbance of skin sensation; Diabetic peripheral neuropathy ([facility]) (lower extremity); Chronic lumbar radicular pain (L5 dermatome) (Location of Tertiary source of pain) (Left); 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) (L>R); Osteoarthritis of knee (Bilateral) (L>R); Lumbar spondylosis; Lumbar facet syndrome (Location of Secondary source of pain) (Bilateral) (L>R);  Lumbar facet arthropathy; Chronic lower extremity pain (Location of Tertiary source of pain) (Bilateral) (L>R); and History of marijuana use on his problem list.. His primarily concern today is the Back Pain and Knee Pain   Pain Assessment: Self-Reported Pain Score: 7 , clinically he looks like a 2/10. Reported level of pain is not compatible with clinical observations. This symptom exaggeration may be due to malingering, an emotional response, Somatic Symptom Disorder, or a lack of understanding on how the pain scale works. Pain Descriptors / Indicators: Constant, Sharp, Shooting, Throbbing Pain Frequency: Constant  Onset and Duration: Gradual, Date of onset: Years ago and Present longer than 3 months Cause of pain: Unknown Severity: Getting worse, NAS-11 at its worse: 8/10, NAS-11 at its best: 7/10, NAS-11 now: 7/10 and NAS-11 on the average: 7/10 Timing: After a period of immobility Aggravating Factors: Bending, Climbing, Intercourse (sex), Kneeling, Lifiting, Motion, Prolonged sitting, Prolonged standing, Squatting, Stooping , Twisting, Walking, Walking uphill, Walking downhill and Working Alleviating Factors: Bending and Medications Associated Problems: Sweating, Swelling, Weakness, Pain that wakes patient up and Pain that does not allow patient to sleep Quality of Pain: Aching, Agonizing, Cruel, Deep, Disabling, Distressing, Dreadful, Dull, Exhausting, Fearful, Getting longer, Heavy, Horrible, Nagging, Sharp, Shooting, Sickening, Stabbing, Tender, Throbbing, Tingling and Toothache-like Previous Examinations or Tests: CT scan, MRI scan and X-rays Previous Treatments: Narcotic medications  The patient comes into the clinics today for the first time for a chronic pain management evaluation. The patient indicates that his primary pain is that of the knees with the left being worst on the right. He denies her having had any surgery but he does admit to having had multiple injections into the  knee by Dr. Andre Knight in [place], [place]. His second worst pain is described to the lower back with the left being worst on the right. This pain does seem to go down the legs  occasionally going to his toes on the left side. He describes his third worst pain as being that of the lower extremities with the left being worst on the right. In the case of the left lower extremity the pain goes all the way down to the top of his foot and into the big toe in what seems to be an L5 dermatomal distribution. In the case of the left lower extremity the pain goes down to the ankle but does not go into his foot.  Historic Controlled Substance Pharmacotherapy Review  Previously Prescribed Opioids: Hydrocodone, oxycodone. Currently Prescribed Analgesic: Currently he is not taking any opioids, but in the past he has taken oxycodone IR 15 mg every 6 hours (60 mg/day) Medications: The patient did not bring the medication(s) to the appointment, as requested in our "New Patient Package" MME/day: 90 mg/day Pharmacodynamics: Analgesic Effect: More than 50% Activity Facilitation: Medication(s) allow patient to sit, stand, walk, and do the basic ADLs Perceived Effectiveness: Described as relatively effective, allowing for increase in activities of daily living (ADL) Side-effects or Adverse reactions: None reported Historical Background Evaluation: [facility] PDMP: Five (5) year initial data search conducted. No abnormal patterns identified [facility] [employer] Public [employer] Offender Public Information: Non-contributory Historical Hospital-associated UDS Results:   Lab Results  Component Value Date   THCU NONE DETECTED 08/05/2012   COCAINSCRNUR NONE DETECTED 08/05/2012   PCPSCRNUR NEG 02/10/2010   AMPHETMU NONE DETECTED 08/05/2012   METHADONE NEG 02/10/2010   UDS Results: No UDS results available at this time UDS Interpretation: N/A Medication Assessment Form: Not applicable. Initial evaluation. The patient has not received any  medications from our practice Treatment compliance: Not applicable. Initial evaluation Risk Assessment: Aberrant Behavior: None observed or detected today Opioid Fatal Overdose Risk Factors: Concomitant use of Benzodiazepines, A history of substance abuse, Male gender, Caucasian and COPD or asthma Non-fatal overdose hazard ratio (HR): Calculation deferred Fatal overdose hazard ratio (HR): Calculation deferred Substance Use Disorder (SUD) Risk Level: High Opioid Risk Tool (ORT) Score: Total Score: 9 High Risk for Opioid Abuse (Score >8) Depression Scale Score: PHQ-2: PHQ-2 Total Score: 0 No depression (0) PHQ-9: PHQ-9 Total Score: 0 No depression (0-4)  Pharmacologic Plan: Pending ordered tests and/or consults  Previous Illicit Drug Screen Labs(s): Lab Results  Component Value Date   COCAINSCRNUR NONE DETECTED 08/05/2012   COCAINSCRNUR NONE DETECTED 05/22/2011   COCAINSCRNUR NEG 02/10/2010   COCAINSCRNUR NONE DETECTED 11/21/2008   COCAINSCRNUR NONE DETECTED 11/23/2006   PCPSCRNUR NEG 02/10/2010   THCU NONE DETECTED 08/05/2012   THCU POSITIVE* 05/22/2011   THCU POSITIVE* 11/21/2008   THCU POSITIVE* 11/23/2006    Meds  The patient has a current medication list which includes the following prescription(s): alprazolam, amlodipine, amphetamine-dextroamphetamine, aspirin ec, atorvastatin, canagliflozin, citalopram, fluticasone-salmeterol, gabapentin, insulin aspart, meloxicam, metformin, mupirocin ointment, nitroglycerin, pantoprazole, proair hfa, rosuvastatin, sacubitril-valsartan, and tiotropium.  Imaging Review  Cervical Imaging: Cervical CT wo contrast:  Results for orders placed during the hospital encounter of 06/25/12  CT Cervical Spine Wo Contrast   Narrative *RADIOLOGY REPORT*  Clinical Data:  NECK PAIN.  CT HEAD WITHOUT CONTRAST CT CERVICAL SPINE WITHOUT CONTRAST  Technique:  Multidetector CT imaging of the head and cervical spine was performed following the standard  protocol without IV contrast. Multiplanar CT image reconstructions of the cervical spine were also generated.  Comparison: None available  CT HEAD  Findings: Atherosclerotic and physiologic intracranial calcifications. There is no evidence of acute intracranial hemorrhage, brain edema, mass lesion, acute infarction,  mass effect, or midline shift. Acute infarct may be inapparent on noncontrast CT.  No other intra-axial abnormalities are seen, and the ventricles and sulci are within normal limits in size and symmetry.   No abnormal extra-axial fluid collections or masses are identified.  No significant calvarial abnormality.  IMPRESSION: 1. Negative for bleed or other acute intracranial process.  CT CERVICAL SPINE  Findings: Small anterior endplate spurs and D34-534 and C7-T1. Vertebral body and intervertebral disc height well maintained throughout.  Facets seated.  No prevertebral soft tissue swelling. Negative for fracture.  Bilateral carotid bifurcation calcified plaque.  Remainder paraspinal soft tissues unremarkable.  IMPRESSION:  1.  Negative for fracture or other acute bony abnormality. 2. Degenerative spurring as above. 3.  Bilateral carotid bifurcation calcified plaque.   Original Report Authenticated By: D. Wallace Going, MD    Thoracic Imaging: Thoracic MR wo contrast:  Results for orders placed during the hospital encounter of 08/05/12  MR Thoracic Spine Wo Contrast   Narrative *RADIOLOGY REPORT*  Clinical Data: Loss of bladder and bowel control.  Right-sided weakness.  MRI THORACIC SPINE WITHOUT CONTRAST  Technique:  Multiplanar and multiecho pulse sequences of the thoracic spine were obtained without intravenous contrast.  Comparison: Chest CT scan dated 12/02/2006  Findings: There are small disc bulges to the right left of midline at T3-4 and T4-5 as well as a small disc bulge to the right at T5-6 which slightly indents the ventral aspect of the  spinal cord but causes no myelopathy.  There is a small disc bulge to the left of midline at C6-7 without impingement.  Tiny disc bulges to the right and left at T7-8.  Small disc protrusion to the left at T11-12 without neural impingement.  The vertebral bodies are normal.  Facet joints are normal.  No spinal or foraminal stenosis.  Paraspinal soft tissues are normal.  IMPRESSION:  1.  Multilevel degenerative disc disease.  There is minimal indentation upon the ventral aspect of the right side of the spinal cord at T5-6 without myelopathy. Small disc protrusion to the left T11-12.  2.  No findings to explain the patient's loss of bowel and bladder control.   Original Report Authenticated By: Lorriane Shire, M.D.    Lumbar Imaging: Lumbar MR wo contrast:  Results for orders placed during the hospital encounter of 06/17/10  MR Lumbar Spine Wo Contrast   Narrative *RADIOLOGY REPORT*  Clinical Data: Low back pain extending through the hips of the past several weeks after fall.  MRI LUMBAR SPINE WITHOUT CONTRAST  Technique:  Multiplanar and multiecho pulse sequences of the lumbar spine were obtained without intravenous contrast.  Comparison: 06/17/2010  Findings: Expedited scan performed due to patient claustrophobia despite premedication.  This leads to reduced signal to noise ratio.  The lowest full intervertebral disk space is labeled L5-S1.  If procedural intervention is to be performed, careful correlation with this numbering strategy is recommended.  The conus medullaris appears unremarkable.  Conus level:  T12-L1.  No significant vertebral subluxation.  Inversion recovery weighted images demonstrate no significant abnormal vertebral or periligamentous edema.  There is suspicion for a diffuse disc bulge at the T11-12 level causing mild central stenosis and mild left foraminal stenosis. There may be a left foraminal annular tear.  Degenerative endplate  findings are present at L2-3 eccentric to the left.  Non-rotated left kidney noted.  Additional findings at individual levels are as follows:  L1-2:  Unremarkable.  L2-3:  Unremarkable.  L3-4:  Minimal  disc osteophyte complex without impingement.  L4-5:  Mild disc osteophyte complex with a borderline right foraminal stenosis.  Mild facet arthropathy.  L5-S1:  Broad left disc protrusion noted with mild left foraminal stenosis.  The disc protrusion slightly abuts the left S1 nerve roots in the lateral recess.  IMPRESSION:  1.  Mild degenerative disc disease and spondylosis, with suspicion for mild central and mild left foraminal stenosis at T11-12, and mild left foraminal stenosis at L5-S1. 2.  Broad disc protrusion at L5-S1 slightly abuts the left S1 nerve roots in the lateral recess.  Original Report Authenticated By: Carron Curie, M.D.   Knee Imaging: Knee-L DG 4 views:  Results for orders placed during the hospital encounter of 08/22/03  DG Knee Complete 4 Views Left   Narrative Clinical Data: Pain. Recent trauma.  LEFT KNEE, FOUR VIEWS   Mineralization normal. Joint spaces preserved. No fracture, dislocation, or bone destruction.   IMPRESSION  No evidence of acute injury.  Provider: Jennye Boroughs, Lavera Guise    ROS  Cardiovascular History: Heart trouble, Daily Aspirin intake, Hypertension, Heart attack ( Date: February 2015), Heart surgery, Congestive heart failure and Needs antibiotics prior to dental procedures Pulmonary or Respiratory History: Lung problems, Asthma, Shortness of breath and Snoring  Neurological History: Negative for epilepsy, stroke, urinary or fecal inontinence, spina bifida or tethered cord syndrome Review of Past Neurological Studies:  Results for orders placed or performed during the hospital encounter of 06/25/12  CT Head Wo Contrast   Narrative   *RADIOLOGY REPORT*  Clinical Data:  NECK PAIN.  CT HEAD WITHOUT CONTRAST CT  CERVICAL SPINE WITHOUT CONTRAST  Technique:  Multidetector CT imaging of the head and cervical spine was performed following the standard protocol without IV contrast. Multiplanar CT image reconstructions of the cervical spine were also generated.  Comparison: None available  CT HEAD  Findings: Atherosclerotic and physiologic intracranial calcifications. There is no evidence of acute intracranial hemorrhage, brain edema, mass lesion, acute infarction,   mass effect, or midline shift. Acute infarct may be inapparent on noncontrast CT.  No other intra-axial abnormalities are seen, and the ventricles and sulci are within normal limits in size and symmetry.   No abnormal extra-axial fluid collections or masses are identified.  No significant calvarial abnormality.  IMPRESSION: 1. Negative for bleed or other acute intracranial process.  CT CERVICAL SPINE  Findings: Small anterior endplate spurs and D34-534 and C7-T1. Vertebral body and intervertebral disc height well maintained throughout.  Facets seated.  No prevertebral soft tissue swelling. Negative for fracture.  Bilateral carotid bifurcation calcified plaque.  Remainder paraspinal soft tissues unremarkable.  IMPRESSION:  1.  Negative for fracture or other acute bony abnormality. 2. Degenerative spurring as above. 3.  Bilateral carotid bifurcation calcified plaque.   Original Report Authenticated By: D. Wallace Going, MD    Psychological-Psychiatric History: Anxiety and Panic Attacks Gastrointestinal History: Reflux or heatburn Genitourinary History: Nephrolithiasis Hematological History: Negative for anticoagulant therapy, anemia, bruising or bleeding easily, hemophilia, sickle cell disease or trait, thrombocytopenia or coagulupathies Endocrine History: Insulin-dependent diabetes mellitus Rheumatologic History: Negative for lupus, osteoarthritis, rheumatoid arthritis, myositis, polymyositis or fibromyagia Musculoskeletal  History: Negative for myasthenia gravis, muscular dystrophy, multiple sclerosis or malignant hyperthermia Work History: Unemployed  Allergies  Mr. Mcdonagh is allergic to tylenol; lodine; and propoxyphene n-acetaminophen.   Laboratory Chemistry  Inflammation Markers Lab Results  Component Value Date   ESRSEDRATE 2 06/13/2015   CRP 0.8 06/13/2015    Renal Function Lab Results  Component Value Date   BUN 15 06/13/2015   CREATININE 0.98 06/13/2015   GFRAA >60 06/13/2015   GFRNONAA >60 06/13/2015    Hepatic Function Lab Results  Component Value Date   AST 17 06/13/2015   ALT 19 06/13/2015   ALBUMIN 4.0 06/13/2015    Electrolytes Lab Results  Component Value Date   NA 138 06/13/2015   K 4.6 06/13/2015   CL 107 06/13/2015   CALCIUM 9.4 06/13/2015   MG 1.4* 06/13/2015    Pain Modulating Vitamins Lab Results  Component Value Date   VD25OH 31 02/03/2010   VITAMINB12 1002* 06/13/2015    Coagulation Parameters Lab Results  Component Value Date   INR 1.59* 02/06/2013   LABPROT 18.5* 02/06/2013   APTT 40* 02/06/2013   PLT 307 03/15/2015    Note: Labs reviewed.  [place]  Medical:  Mr. Mound  has a past medical history of Anxiety; Pulmonary nodule; Hyperlipidemia; COPD (chronic obstructive pulmonary disease) ([place]); Chronic back pain; Obesity; Diabetes mellitus, type 2 ([place]); Hypertension; Cellulitis (2011); GERD (gastroesophageal reflux disease); Claustrophobia; Diverticulitis of colon (2008); and Heart attack ([place]). Family: family history includes Cancer in his mother; Coronary artery disease in his mother and sister; Diabetes in his sister; Heart attack in his brother and father. There is no history of Colon cancer. Surgical:  has past surgical history that includes Appendectomy (2007); Tonsillectomy; Coronary artery bypass graft (N/A, 02/06/2013); and left heart catheterization with coronary angiogram (N/A, 02/03/2013). Tobacco:  reports that he quit smoking about 26  years ago. His smoking use included Cigarettes. He started smoking about 48 years ago. He has a 28.5 pack-year smoking history. He has never used smokeless tobacco. Alcohol:  reports that he does not drink alcohol. Drug:  reports that he uses illicit drugs (Marijuana). Active Ambulatory Problems    Diagnosis Date Noted  . Type 2 diabetes mellitus with hyperglycemia, with long-term current use of insulin ([place]) 05/13/2007  . HLD (hyperlipidemia) 05/13/2007  . Obesity, unspecified 05/13/2007  . Anxiety state 05/13/2007  . Essential hypertension 05/13/2007  . COPD 05/13/2007  . PULMONARY NODULE 05/13/2007  . HERNIA 06/24/2009  . GERD (gastroesophageal reflux disease) 12/15/2010  . Esophageal dysphagia 12/15/2010  . Chronic diarrhea 12/15/2010  . Accelerated hypertension 05/21/2011  . NSTEMI (non-ST elevated myocardial infarction) ([place] [facility]) 02/03/2013  . CAD (coronary artery disease), native coronary artery 02/05/2013  . S/P CABG x 3 02/06/2013  . Chronic systolic heart failure ([place]) 12/06/2013  . Emphysema of lung ([place]) 03/12/2015  . Disorder of joint of spine 08/23/2012  . H/O coronary artery bypass surgery 04/24/2013  . Chronic pain 06/13/2015  . Encounter for pain management planning 06/13/2015  . Encounter for therapeutic drug level monitoring 06/13/2015  . Opiate use (90 MME/Day) 06/13/2015  . Chronic low back pain (Location of Secondary source of pain) (Bilateral) (L>R) 06/13/2015  . Chronic knee pain (Location of Primary Source of Pain) (Bilateral) (L>R) 06/13/2015  . Chronic foot pain (Bilateral) (L>R) 06/13/2015  . Avitaminosis D 06/13/2015  . Disturbance of skin sensation 06/13/2015  . Diabetic peripheral neuropathy ([facility]) (lower extremity) 06/13/2015  . Chronic lumbar radicular pain (L5 dermatome) (Location of Tertiary source of pain) (Left) 06/13/2015  . 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) (L>R) 06/13/2015  . Osteoarthritis of knee  (Bilateral) (L>R) 06/13/2015  . Lumbar spondylosis 06/13/2015  . Lumbar facet syndrome (Location of Secondary source of pain) (Bilateral) (L>R) 06/13/2015  . Lumbar facet arthropathy 06/13/2015  . Chronic lower  extremity pain (Location of Tertiary source of pain) (Bilateral) (L>R) 06/13/2015  . History of marijuana use 06/13/2015   Resolved Ambulatory Problems    Diagnosis Date Noted  . 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 CELLULITIS, NECK 12/07/2009  . Acute lymphadenitis 11/23/2007  . FATIGUE 01/25/2009  . ABDOMINAL PAIN 06/24/2009  . Acute respiratory failure ([place]) 05/21/2011  . Non-ST elevation myocardial infarction (NSTEMI), initial episode of care ([place]) 02/03/2013  . Cellulitis, scrotum 03/12/2015  . Cellulitis of scrotum 03/12/2015  . Back pain, thoracic 08/05/2012   Past Medical History  Diagnosis Date  . Anxiety   . Pulmonary nodule   . Hyperlipidemia   . COPD (chronic obstructive pulmonary disease) ([place])   . Chronic back pain   . Obesity   . Diabetes mellitus, type 2 ([place])   . Hypertension   . Cellulitis 2011  . Claustrophobia   . Diverticulitis of colon 2008  . Heart attack ([place])     Constitutional Exam  Vitals: Blood pressure 117/64, pulse 70, temperature 97.9 F (36.6 C), temperature source Oral, resp. rate 16, height 5\' 9"  (1.753 m), weight 220 lb (99.791 kg), SpO2 99 %. General appearance: Well nourished, well developed, and well hydrated. In no acute distress Calculated BMI/Body habitus: Body mass index is 32.47 kg/(m^2). (30-34.9 kg/m2) Obese (Class I) - 68% higher incidence of chronic pain Psych/Mental status: Alert and oriented x 3 (person, place, & time) Eyes: PERLA Respiratory: No evidence of acute respiratory distress  Cervical Spine Exam  Inspection: No masses, redness, or swelling Alignment: Symmetrical ROM: Functional: Adequate ROM Active: Unrestricted ROM Stability: No instability detected Muscle strength & Tone:  Functionally intact Sensory: Unimpaired Palpation: No complaints of tenderness  Upper Extremity (UE) Exam    Side: Right upper extremity  Side: Left upper extremity  Inspection: No masses, redness, swelling, or asymmetry  Inspection: No masses, redness, swelling, or asymmetry  ROM:  ROM:  Functional: Adequate ROM  Functional: Adequate ROM  Active: Unrestricted ROM  Active: Unrestricted ROM  Muscle strength & Tone: Functionally intact  Muscle strength & Tone: Functionally intact  Sensory: Unimpaired  Sensory: Unimpaired  Palpation: Non-contributory  Palpation: Non-contributory   Thoracic Spine Exam  Inspection: No masses, redness, or swelling Alignment: Symmetrical ROM: Functional: Adequate ROM Active: Unrestricted ROM Stability: No instability detected Sensory: Unimpaired Muscle strength & Tone: Functionally intact Palpation: No complaints of tenderness  Lumbar Spine Exam  Inspection: No masses, redness, or swelling Alignment: Symmetrical ROM: Functional: Limited ROM Active: Decreased ROM Stability: No instability detected Muscle strength & Tone: Functionally intact Sensory: Unimpaired Palpation: Tender Provocative Tests: Lumbar Hyperextension and rotation test: Positive for bilateral lumbar facet pain Patrick's Maneuver: deferred  Gait & Posture Assessment  Ambulation: Unassisted Gait: Unaffected Posture: WNL  Lower Extremity Exam    Side: Right lower extremity  Side: Left lower extremity  Inspection: No masses, redness, swelling, or asymmetry ROM:  Inspection: No masses, redness, swelling, or asymmetry ROM:  Functional: Adequate ROM  Functional: Adequate ROM  Active: Unrestricted ROM  Active: Unrestricted ROM  Muscle strength & Tone: Functionally intact  Muscle strength & Tone: Functionally intact  Sensory: Unimpaired  Sensory: Unimpaired  Palpation: Non-contributory  Palpation: Non-contributory   Assessment  Primary Diagnosis & Pertinent Problem List: The  primary encounter diagnosis was Chronic pain. Diagnoses of Encounter for pain management planning, Encounter for therapeutic drug level monitoring, Opiate use, Chronic low back pain, Chronic knee pain, unspecified laterality, Chronic foot pain, unspecified laterality, Avitaminosis D, Disturbance of  skin sensation, Diabetic peripheral neuropathy ([facility]), Chronic lumbar radicular pain (L5 dermatome) (Location of Tertiary source of pain) (Left), 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) (L>R), Primary osteoarthritis of both knees, Lumbar spondylosis, unspecified spinal osteoarthritis, Lumbar facet syndrome (Location of Secondary source of pain) (Bilateral) (L>R), Lumbar facet arthropathy, Chronic pain of lower extremity, unspecified laterality, and History of marijuana use were also pertinent to this visit.  Visit Diagnosis: 1. Chronic pain   2. Encounter for pain management planning   3. Encounter for therapeutic drug level monitoring   4. Opiate use   5. Chronic low back pain   6. Chronic knee pain, unspecified laterality   7. Chronic foot pain, unspecified laterality   8. Avitaminosis D   9. Disturbance of skin sensation   10. Diabetic peripheral neuropathy ([place])   11. Chronic lumbar radicular pain (L5 dermatome) (Location of Tertiary source of pain) (Left)   12. 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) (L>R)   13. Primary osteoarthritis of both knees   14. Lumbar spondylosis, unspecified spinal osteoarthritis   15. Lumbar facet syndrome (Location of Secondary source of pain) (Bilateral) (L>R)   16. Lumbar facet arthropathy   17. Chronic pain of lower extremity, unspecified laterality   18. History of marijuana use     Assessment: No problem-specific assessment & plan notes found for this encounter.   Plan of Care  Initial Treatment Plan:  Please be advised that as per protocol, today's visit has been an  evaluation only. We have not taken over the patient's controlled substance management.  Problem List Items Addressed This Visit      High   Chronic foot pain (Bilateral) (L>R) (Chronic)   Chronic knee pain (Location of Primary Source of Pain) (Bilateral) (L>R) (Chronic)   Relevant Orders   DG Knee 1-2 Views Left (Completed)   DG Knee 1-2 Views Right (Completed)   Chronic low back pain (Location of Secondary source of pain) (Bilateral) (L>R) (Chronic)   Relevant Medications   meloxicam (MOBIC) 15 MG tablet   Other Relevant Orders   DG Lumbar Spine Complete W/Bend (Completed)   Chronic lower extremity pain (Location of Tertiary source of pain) (Bilateral) (L>R) (Chronic)   Chronic lumbar radicular pain (L5 dermatome) (Location of Tertiary source of pain) (Left) (Chronic)   Relevant Orders   NCV with EMG(electromyography)   MR Lumbar Spine Wo Contrast   Chronic pain - Primary (Chronic)   Relevant Medications   meloxicam (MOBIC) 15 MG tablet   gabapentin (NEURONTIN) 600 MG tablet   Other Relevant Orders   Comprehensive metabolic panel (Completed)   C-reactive protein (Completed)   Magnesium (Completed)   Sedimentation rate (Completed)   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) (L>R) (Chronic)   Relevant Medications   meloxicam (MOBIC) 15 MG tablet   Other Relevant Orders   DG Si Joints (Completed)   Diabetic peripheral neuropathy ([place]) (lower extremity) (Chronic)   Relevant Medications   gabapentin (NEURONTIN) 600 MG tablet   rosuvastatin (CRESTOR) 10 MG tablet   Other Relevant Orders   NCV with EMG(electromyography)   Lumbar facet arthropathy (Chronic)   Lumbar facet syndrome (Location of Secondary source of pain) (Bilateral) (L>R) (Chronic)   Relevant Medications   meloxicam (MOBIC) 15 MG tablet   Lumbar spondylosis (Chronic)   Relevant Medications   meloxicam (MOBIC) 15 MG tablet   Osteoarthritis of knee (Bilateral) (L>R) (Chronic)  Relevant Medications   meloxicam (MOBIC) 15 MG tablet   Other Relevant Orders   DG Knee 1-2 Views Left (Completed)   DG Knee 1-2 Views Right (Completed)     Medium   Encounter for pain management planning   Encounter for therapeutic drug level monitoring   History of marijuana use   Opiate use (90 MME/Day)   Relevant Orders   Compliance Drug Analysis, Ur   Ambulatory referral to Psychology     Low   Avitaminosis D   Relevant Orders   25-Hydroxyvitamin D Lcms D2+D3   Disturbance of skin sensation   Relevant Orders   Vitamin B12 (Completed)      Pharmacotherapy (Medications Ordered): No orders of the defined types were placed in this encounter.    Lab-work & Procedure Ordered: Orders Placed This Encounter  Procedures  . DG Lumbar Spine Complete W/Bend  . DG Si Joints  . MR Lumbar Spine Wo Contrast  . DG Knee 1-2 Views Left  . DG Knee 1-2 Views Right  . Compliance Drug Analysis, Ur  . Comprehensive metabolic panel  . C-reactive protein  . Magnesium  . Sedimentation rate  . Vitamin B12  . 25-Hydroxyvitamin D Lcms D2+D3  . Ambulatory referral to Psychology  . NCV with EMG(electromyography)    Imaging Ordered: AMB REFERRAL TO PSYCHOLOGY MR LUMBAR SPINE WO CONTRAST  Interventional Therapies: Scheduled:  None at this time.    Considering:   1. Diagnostic bilateral intra-articular knee injections without fluoroscopy or sedation.  2. Possible diagnostic bilateral genicular nerve block under fluoroscopic guidance and IV sedation.  3. Possible genicular radiofrequency ablation.  4. Diagnostic bilateral lumbar facet blocks under fluoroscopic guidance and IV sedation.  5. Diagnostic left sided L5-S1  Lumbar epidural steroid injection under fluoroscopic guidance, with or without sedation.    PRN Procedures:  None at this time.    Referral(s) or Consult(s): Medical psychology consult for substance use disorder evaluation  Medications administered during this  visit: Mr. Hoeg had no medications administered during this visit.  Prescriptions ordered during this visit: New Prescriptions   No medications on file    Requested PM Follow-up: Return for Return after [facility] (SUD) Eval.  Future Appointments Date Time Provider [place]  06/21/2015 9:00 AM [facility] 1 [facility] [place]     Primary Care Physician: [facility] Location: [facility] Note by: Kathlen Brunswick. Dossie [facility], M.D, DABA, DABAPM, DABPM, DABIPP, FIPP  Pain Score Disclaimer: We use the NRS-11 scale. This is a self-reported, subjective measurement of pain severity with only modest accuracy. It is used primarily to identify changes within a particular patient. It must be understood that outpatient pain scales are significantly less accurate that those used for research, where they can be applied under ideal controlled circumstances with minimal exposure to variables. In reality, the score is likely to be a combination of pain intensity and pain affect, where pain affect describes the degree of emotional arousal or changes in action readiness caused by the sensory experience of pain. Factors such as social and work situation, setting, emotional state, anxiety levels, expectation, and prior pain experience may influence pain perception and show large inter-individual differences that may also be affected by time variables.  Patient instructions provided during this appointment: There are no Patient Instructions on file for this visit.

## 2015-06-13 NOTE — Progress Notes (Signed)
Safety precautions to be maintained throughout the outpatient stay will include: orient to surroundings, keep bed in low position, maintain call bell within reach at all times, provide assistance with transfer out of bed and ambulation.  

## 2015-06-16 LAB — 25-HYDROXYVITAMIN D LCMS D2+D3: 25-HYDROXY, VITAMIN D: 25 ng/mL — AB

## 2015-06-16 LAB — 25-HYDROXY VITAMIN D LCMS D2+D3: 25-Hydroxy, Vitamin D-3: 25 ng/mL

## 2015-06-17 ENCOUNTER — Other Ambulatory Visit: Payer: Self-pay | Admitting: Pain Medicine

## 2015-06-17 ENCOUNTER — Encounter: Payer: Self-pay | Admitting: Pain Medicine

## 2015-06-17 DIAGNOSIS — E559 Vitamin D deficiency, unspecified: Secondary | ICD-10-CM

## 2015-06-17 MED ORDER — MAGNESIUM OXIDE -MG SUPPLEMENT 500 MG PO CAPS: 1.0000 | ORAL_CAPSULE | Freq: Two times a day (BID) | ORAL | Status: DC

## 2015-06-17 MED ORDER — VITAMIN D3 50 MCG (2000 UT) PO CAPS: ORAL_CAPSULE | ORAL | Status: DC

## 2015-06-17 MED ORDER — VITAMIN D (ERGOCALCIFEROL) 1.25 MG (50000 UNIT) PO CAPS: ORAL_CAPSULE | ORAL | Status: DC

## 2015-06-17 NOTE — Progress Notes (Signed)

## 2015-06-17 NOTE — Progress Notes (Signed)
Quick Note:   Normal fasting (NPO x 8 hours) glucose levels are between 65-99 mg/dl, with 2 hour fasting, levels are usually less than 140 mg/dl. Any random blood glucose level greater than 200 mg/dl is considered to be Diabetes.  This is a blood test that measures the amount of a substance called bilirubin. This test is used to find out how well your liver is working. It is often given as part of a panel of tests that measure liver function. A small amount of bilirubin in your blood is normal, but a high level may be a sign of liver disease.  The liver makes bile to help you digest food, and bile contains bilirubin. Most bilirubin comes from the body's normal process of breaking down old red blood cells. A healthy liver can normally get rid of bilirubin. But when you have liver problems, it can build up in your body to unhealthy levels.  The reference range of total bilirubin is 0.3 - 1.2 mg/dL. The reference range of direct bilirubin is 0.1-0.4 mg/dL.  Low levels of bilirubin (<0.3) are generally not concerning and are not monitored. ______

## 2015-06-17 NOTE — Progress Notes (Signed)
Quick Note:  Normal Magnesium levels are between 1.6 and 2.3 mEq/L. Low magnesium blood level can lead to low calcium and potassium levels. Low levels may indicate inadequate dietary consuming, poor absorbtion, or excessive excretion. Signs and symptoms may include: leg cramps, foot pain, muscle twitches, loss of appetite, nausea, vomiting, fatigue, weakness, numbness, tingling, seizures, personality changes, abnormal heart rhythms, and/or coronary artery spasms.  ______ 

## 2015-06-17 NOTE — Progress Notes (Signed)
Quick Note:   Normal Vitamin B-12 levels are between 211 and 946 pg/mL, for our Lab. Medical conditions that can increase levels of vitamin B12 include liver disease, kidney failure and myeloproliferative disorders, which includes myelocytic leukemia and polycythemia vera.  ______ 

## 2015-06-20 LAB — COMPLIANCE DRUG ANALYSIS, UR: PDF: 0

## 2015-06-21 ENCOUNTER — Ambulatory Visit (HOSPITAL_COMMUNITY)
Admission: RE | Admit: 2015-06-21 | Discharge: 2015-06-21 | Disposition: A | Discharge status: Home / Self Care | Payer: Medicare Other | Source: Ambulatory Visit | Attending: Pain Medicine | Admitting: Pain Medicine

## 2015-06-21 DIAGNOSIS — G8929 Other chronic pain: Secondary | ICD-10-CM | POA: Diagnosis not present

## 2015-06-21 DIAGNOSIS — M5416 Radiculopathy, lumbar region: Secondary | ICD-10-CM | POA: Insufficient documentation

## 2015-06-21 DIAGNOSIS — M5136 Other intervertebral disc degeneration, lumbar region: Secondary | ICD-10-CM | POA: Insufficient documentation

## 2015-07-08 ENCOUNTER — Ambulatory Visit: Payer: Medicare Other | Attending: Pain Medicine | Admitting: Pain Medicine

## 2015-07-08 ENCOUNTER — Encounter: Payer: Self-pay | Admitting: Pain Medicine

## 2015-07-08 VITALS — BP 129/63 | Ht 69.0 in | Wt 235.0 lb

## 2015-07-08 DIAGNOSIS — F1190 Opioid use, unspecified, uncomplicated: Secondary | ICD-10-CM

## 2015-07-08 DIAGNOSIS — M47896 Other spondylosis, lumbar region: Secondary | ICD-10-CM | POA: Diagnosis not present

## 2015-07-08 DIAGNOSIS — Z951 Presence of aortocoronary bypass graft: Secondary | ICD-10-CM | POA: Insufficient documentation

## 2015-07-08 DIAGNOSIS — R197 Diarrhea, unspecified: Secondary | ICD-10-CM | POA: Insufficient documentation

## 2015-07-08 DIAGNOSIS — E559 Vitamin D deficiency, unspecified: Secondary | ICD-10-CM | POA: Insufficient documentation

## 2015-07-08 DIAGNOSIS — M5146 Schmorl's nodes, lumbar region: Secondary | ICD-10-CM | POA: Diagnosis not present

## 2015-07-08 DIAGNOSIS — E785 Hyperlipidemia, unspecified: Secondary | ICD-10-CM | POA: Insufficient documentation

## 2015-07-08 DIAGNOSIS — M533 Sacrococcygeal disorders, not elsewhere classified: Secondary | ICD-10-CM | POA: Insufficient documentation

## 2015-07-08 DIAGNOSIS — M160 Bilateral primary osteoarthritis of hip: Secondary | ICD-10-CM | POA: Insufficient documentation

## 2015-07-08 DIAGNOSIS — Z5181 Encounter for therapeutic drug level monitoring: Secondary | ICD-10-CM

## 2015-07-08 DIAGNOSIS — M79605 Pain in left leg: Secondary | ICD-10-CM | POA: Diagnosis not present

## 2015-07-08 DIAGNOSIS — R911 Solitary pulmonary nodule: Secondary | ICD-10-CM | POA: Diagnosis not present

## 2015-07-08 DIAGNOSIS — M25569 Pain in unspecified knee: Secondary | ICD-10-CM | POA: Diagnosis present

## 2015-07-08 DIAGNOSIS — M545 Low back pain, unspecified: Secondary | ICD-10-CM

## 2015-07-08 DIAGNOSIS — I2510 Atherosclerotic heart disease of native coronary artery without angina pectoris: Secondary | ICD-10-CM | POA: Insufficient documentation

## 2015-07-08 DIAGNOSIS — I214 Non-ST elevation (NSTEMI) myocardial infarction: Secondary | ICD-10-CM

## 2015-07-08 DIAGNOSIS — F419 Anxiety disorder, unspecified: Secondary | ICD-10-CM | POA: Insufficient documentation

## 2015-07-08 DIAGNOSIS — M79604 Pain in right leg: Secondary | ICD-10-CM | POA: Insufficient documentation

## 2015-07-08 DIAGNOSIS — E1140 Type 2 diabetes mellitus with diabetic neuropathy, unspecified: Secondary | ICD-10-CM | POA: Insufficient documentation

## 2015-07-08 DIAGNOSIS — K469 Unspecified abdominal hernia without obstruction or gangrene: Secondary | ICD-10-CM | POA: Diagnosis not present

## 2015-07-08 DIAGNOSIS — M5126 Other intervertebral disc displacement, lumbar region: Secondary | ICD-10-CM | POA: Insufficient documentation

## 2015-07-08 DIAGNOSIS — I10 Essential (primary) hypertension: Secondary | ICD-10-CM | POA: Insufficient documentation

## 2015-07-08 DIAGNOSIS — R1310 Dysphagia, unspecified: Secondary | ICD-10-CM | POA: Insufficient documentation

## 2015-07-08 DIAGNOSIS — Z794 Long term (current) use of insulin: Secondary | ICD-10-CM | POA: Diagnosis not present

## 2015-07-08 DIAGNOSIS — Z7984 Long term (current) use of oral hypoglycemic drugs: Secondary | ICD-10-CM | POA: Diagnosis not present

## 2015-07-08 DIAGNOSIS — E669 Obesity, unspecified: Secondary | ICD-10-CM | POA: Insufficient documentation

## 2015-07-08 DIAGNOSIS — J439 Emphysema, unspecified: Secondary | ICD-10-CM | POA: Diagnosis not present

## 2015-07-08 DIAGNOSIS — I252 Old myocardial infarction: Secondary | ICD-10-CM | POA: Diagnosis not present

## 2015-07-08 DIAGNOSIS — I5022 Chronic systolic (congestive) heart failure: Secondary | ICD-10-CM | POA: Diagnosis not present

## 2015-07-08 DIAGNOSIS — M170 Bilateral primary osteoarthritis of knee: Secondary | ICD-10-CM | POA: Insufficient documentation

## 2015-07-08 DIAGNOSIS — M47816 Spondylosis without myelopathy or radiculopathy, lumbar region: Secondary | ICD-10-CM

## 2015-07-08 DIAGNOSIS — G8929 Other chronic pain: Secondary | ICD-10-CM | POA: Insufficient documentation

## 2015-07-08 DIAGNOSIS — M549 Dorsalgia, unspecified: Secondary | ICD-10-CM | POA: Diagnosis present

## 2015-07-08 DIAGNOSIS — R296 Repeated falls: Secondary | ICD-10-CM | POA: Insufficient documentation

## 2015-07-08 DIAGNOSIS — E1165 Type 2 diabetes mellitus with hyperglycemia: Secondary | ICD-10-CM | POA: Insufficient documentation

## 2015-07-08 DIAGNOSIS — M5416 Radiculopathy, lumbar region: Secondary | ICD-10-CM

## 2015-07-08 DIAGNOSIS — K219 Gastro-esophageal reflux disease without esophagitis: Secondary | ICD-10-CM | POA: Diagnosis not present

## 2015-07-08 DIAGNOSIS — Z87891 Personal history of nicotine dependence: Secondary | ICD-10-CM | POA: Diagnosis not present

## 2015-07-08 MED ORDER — OXYCODONE HCL 15 MG PO TABS: 15.0000 mg | ORAL_TABLET | Freq: Four times a day (QID) | ORAL | Status: DC | PRN

## 2015-07-08 NOTE — Patient Instructions (Signed)
Epidural Steroid Injection Patient Information  Description: The epidural space surrounds the nerves as they exit the spinal cord.  In some patients, the nerves can be compressed and inflamed by a bulging disc or a tight spinal canal (spinal stenosis).  By injecting steroids into the epidural space, we can bring irritated nerves into direct contact with a potentially helpful medication.  These steroids act directly on the irritated nerves and can reduce swelling and inflammation which often leads to decreased pain.  Epidural steroids may be injected anywhere along the spine and from the neck to the low back depending upon the location of your pain.   After numbing the skin with local anesthetic (like Novocaine), a small needle is passed into the epidural space slowly.  You may experience a sensation of pressure while this is being done.  The entire block usually last less than 10 minutes.  Conditions which may be treated by epidural steroids:   Low back and leg pain  Neck and arm pain  Spinal stenosis  Post-laminectomy syndrome  Herpes zoster (shingles) pain  Pain from compression fractures  Preparation for the injection:  1. Do not eat any solid food or dairy products within 8 hours of your appointment.  2. You may drink clear liquids up to 3 hours before appointment.  Clear liquids include water, black coffee, juice or soda.  No milk or cream please. 3. You may take your regular medication, including pain medications, with a sip of water before your appointment  Diabetics should hold regular insulin (if taken separately) and take 1/2 normal NPH dos the morning of the procedure.  Carry some sugar containing items with you to your appointment. 4. A driver must accompany you and be prepared to drive you home after your procedure.  5. Bring all your current medications with your. 6. An IV may be inserted and sedation may be given at the discretion of the physician.   7. A blood pressure  cuff, EKG and other monitors will often be applied during the procedure.  Some patients may need to have extra oxygen administered for a short period. 8. You will be asked to provide medical information, including your allergies, prior to the procedure.  We must know immediately if you are taking blood thinners (like Coumadin/Warfarin)  Or if you are allergic to IV iodine contrast (dye). We must know if you could possible be pregnant.  Possible side-effects:  Bleeding from needle site  Infection (rare, may require surgery)  Nerve injury (rare)  Numbness & tingling (temporary)  Difficulty urinating (rare, temporary)  Spinal headache ( a headache worse with upright posture)  Light -headedness (temporary)  Pain at injection site (several days)  Decreased blood pressure (temporary)  Weakness in arm/leg (temporary)  Pressure sensation in back/neck (temporary)  Call if you experience:  Fever/chills associated with headache or increased back/neck pain.  Headache worsened by an upright position.  New onset weakness or numbness of an extremity below the injection site  Hives or difficulty breathing (go to the emergency room)  Inflammation or drainage at the infection site  Severe back/neck pain  Any new symptoms which are concerning to you  Please note:  Although the local anesthetic injected can often make your back or neck feel good for several hours after the injection, the pain will likely return.  It takes 3-7 days for steroids to work in the epidural space.  You may not notice any pain relief for at least that one week.    If effective, we will often do a series of three injections spaced 3-6 weeks apart to maximally decrease your pain.  After the initial series, we generally will wait several months before considering a repeat injection of the same type.  If you have any questions, please call [phone] [facility] Prescription  given for roxicodone. Please follow above instructions to prepare for your procedure

## 2015-07-08 NOTE — Progress Notes (Signed)
Safety precautions to be maintained throughout the outpatient stay will include: orient to surroundings, keep bed in low position, maintain call bell within reach at all times, provide assistance with transfer out of bed and ambulation.  

## 2015-07-08 NOTE — Progress Notes (Signed)
Patient's Name: Manuel Knight  Patient type: Established  MRN: [id number]  Service setting: Ambulatory outpatient  DOB: 29-Sep-1960  Location: [facility]  DOS: 07/08/2015  Primary Care Physician: [facility]  Note by: Beatriz Chancellor A. Dossie Arbour, M.D, DABA, DABAPM, DABPM, DABIPP, FIPP  Referring Physician: [facility]*  Specialty: Board-Certified Interventional Pain Management  Last Visit to Pain Management: 06/13/2015   Primary Reason(s) for Visit: Encounter for evaluation before starting new chronic pain management plan of care (Level of risk: moderate) CC: Knee Pain and Back Pain   HPI  Manuel Knight is a 55 y.o. year old, male patient, who returns today as an established patient. He has Type 2 diabetes mellitus with hyperglycemia, with long-term current use of insulin ([place]); HLD (hyperlipidemia); Obesity, unspecified; Anxiety state; Essential hypertension; COPD; PULMONARY NODULE; HERNIA; GERD (gastroesophageal reflux disease); Esophageal dysphagia; Chronic diarrhea; Accelerated hypertension; NSTEMI (non-ST elevated myocardial infarction) ([place]); CAD (coronary artery disease), native coronary artery; S/P CABG x 3; Chronic systolic heart failure ([place]); Emphysema of lung ([place]); Disorder of joint of spine; H/O coronary artery bypass surgery; Chronic pain; Encounter for pain management planning; Encounter for therapeutic drug level monitoring; Opiate use (90 MME/Day); Chronic low back pain (Location of Secondary source of pain) (Bilateral) (L>R); Chronic knee pain (Location of Primary Source of Pain) (Bilateral) (L>R); Chronic foot pain (Bilateral) (L>R); Disturbance of skin sensation; Diabetic peripheral neuropathy (HCC) (lower extremity); Chronic lumbar radicular pain (L5 dermatome) (Location of Tertiary source of pain) (Left); 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) (L>R); Osteoarthritis of knee (Bilateral)  (L>R); Lumbar spondylosis; Lumbar facet syndrome (Location of Secondary source of pain) (Bilateral) (L>R); Lumbar facet arthropathy; Chronic lower extremity pain (Location of Tertiary source of pain) (Bilateral) (L>R); History of marijuana use; Hypomagnesemia; and Vitamin D insufficiency on his problem list.. His primarily concern today is the Knee Pain and Back Pain   Pain Assessment: Self-Reported Pain Score: 6 . Clinically the patient looks like a 2/10. Reported level is inconsistent with clinical obrservations Information on the proper use of the pain score provided to the patient today. Pain Type: Chronic pain Pain Location: Knee (back) Pain Orientation: Left (left knee worse, low back) Pain Descriptors / Indicators: Constant, Sharp, Shooting, Throbbing Pain Frequency: Constant  The patient comes into the clinics today for post-procedure evaluation on the interventional treatment done on 06/13/2015. In addition, he comes in today for pharmacological management of his chronic pain.  The patient  reports that he uses illicit drugs (Marijuana).   The patient indicates that he has not used any marijuana or illegal substances since last year.  Date of Last Visit: 06/13/15 Service Provided on Last Visit: Evaluation (new patient)   Controlled Substance Pharmacotherapy Assessment & REMS (Risk Evaluation and Mitigation Strategy)  Analgesic: Oxycodone IR 15 mg every 6 hours (60 mg/day) MME/day: 90 mg/day.   Pill Count: The patient did not bring any of his pills to the appointment. Patient reminded that we need to count pills. Pharmacokinetics: Onset of action (Liberation/Absorption): Within expected pharmacological parameters Time to Peak effect (Distribution): Timing and results are as within normal expected parameters Duration of action (Metabolism/Excretion): Within normal limits for medication Pharmacodynamics: Analgesic Effect: More than 50% Activity Facilitation: Medication(s) allow  patient to sit, stand, walk, and do the basic ADLs Perceived Effectiveness: Described as relatively effective, allowing for increase in activities of daily living (ADL) Side-effects or Adverse reactions: None reported Monitoring: [place] PMP: Online review of  the past 86-month period conducted. Compliant with practice rules and regulations Last UDS on record: SUMMARY  Date Value Ref Range Status  06/13/2015 FINAL  Final    Comment:    ==================================================================== TOXASSURE COMP DRUG ANALYSIS,UR ==================================================================== Test                             Result       Flag       Units Drug Present and Declared for Prescription Verification   Citalopram                     PRESENT      EXPECTED   Desmethylcitalopram            PRESENT      EXPECTED    Desmethylcitalopram is an expected metabolite of citalopram or    the enantiomeric form, escitalopram. Drug Present not Declared for Prescription Verification   Alcohol, Ethyl                 0.381        UNEXPECTED g/dL    Sources of ethyl alcohol include alcoholic beverages or as a    fermentation product of glucose; glucose is present in this    specimen.  The high concentration of ethyl alcohol and the    presence of glucose supports fermentation as the source of ethyl    alcohol in this specimen. Drug Absent but Declared for Prescription Verification   Amphetamine                    Not Detected UNEXPECTED ng/mg creat   Alprazolam                     Not Detected UNEXPECTED ng/mg creat   Salicylate                     Not Detected UNEXPECTED    Aspirin, as indicated in the declared medication list, is not    always detected even when used as directed. ==================================================================== Test                      Result    Flag   Units      Ref Range   Creatinine              80               mg/dL       >=20 ==================================================================== Declared Medications:  The flagging and interpretation on this report are based on the  following declared medications.  Unexpected results may arise from  inaccuracies in the declared medications.  **Note: The testing scope of this panel includes these medications:  Alprazolam (Xanax)  Amphetamine (Adderall)  Citalopram (Celexa)  **Note: The testing scope of this panel does not include small to  moderate amounts of these reported medications:  Aspirin (Aspirin 81)  **Note: The testing scope of this panel does not include following  reported medications:  Albuterol (ProAir HFA)  Amlodipine (Norvasc)  Atorvastatin (Lipitor)  Canagliflozin (Invokana)  Fluticasone (Advair)  Insulin (NovoLog)  Meloxicam (Mobic)  Metformin (Glucophage)  Mupirocin (Bactroban)  Nitroglycerin (Nitrostat)  Pantoprazole (Protonix)  Sacubitril (Entresto)  Salmeterol (Advair)  Tiotropium (Spiriva)  Valsartan (Entresto) ==================================================================== For clinical consultation, please call [phone]. ====================================================================    UDS interpretation: Compliant Medication Assessment Form: Reviewed. Patient indicates  being compliant with therapy Treatment compliance: Compliant Risk Assessment: Aberrant Behavior: None observed today Substance Use Disorder (SUD) Risk Level: Very High. High risk for SUD with prior history of marijuana use and 3 positive UDS tests for THC (11/23/2006, 11/21/2008, and 05/22/2011). Risk of opioid abuse or dependence: 0.7-3.0% with doses ? 36 MME/day and 6.1-26% with doses ? 120 MME/day. Opioid Risk Tool (ORT) Score:  9 High Risk for Opioid Abuse (Score >8) Depression Scale Score: PHQ-2: PHQ-2 Total Score: 0 No depression (0) PHQ-9: PHQ-9 Total Score: 0 No depression (0-4)  Pharmacologic Plan: Today we will take over the  chronic pain medication management and from this point on our medication agreement with this patient is active. The patient was reminded that we have a "Zero tolerance" for illegal medications. He indicated having understood.  Previous Illicit Drug Screen Labs(s): Lab Results  Component Value Date   COCAINSCRNUR NONE DETECTED 08/05/2012   PCPSCRNUR NEG 02/10/2010   THCU NONE DETECTED 08/05/2012   All Historical UDS testing(s): Lab Results  Component Value Date   COCAINSCRNUR NONE DETECTED 08/05/2012   COCAINSCRNUR NONE DETECTED 05/22/2011   COCAINSCRNUR NEG 02/10/2010   COCAINSCRNUR NONE DETECTED 11/21/2008   COCAINSCRNUR NONE DETECTED 11/23/2006   PCPSCRNUR NEG 02/10/2010   THCU NONE DETECTED 08/05/2012   THCU POSITIVE* 05/22/2011   THCU POSITIVE* 11/21/2008   THCU POSITIVE* 11/23/2006   List of UDS test(s) done: Lab Results  Component Value Date   SUMMARY FINAL 06/13/2015   Laboratory Chemistry  Inflammation Markers Lab Results  Component Value Date   ESRSEDRATE 2 06/13/2015   CRP 0.8 06/13/2015    Renal Function Lab Results  Component Value Date   BUN 15 06/13/2015   CREATININE 0.98 06/13/2015   GFRAA >60 06/13/2015   GFRNONAA >60 06/13/2015    Hepatic Function Lab Results  Component Value Date   AST 17 06/13/2015   ALT 19 06/13/2015   ALBUMIN 4.0 06/13/2015    Electrolytes Lab Results  Component Value Date   NA 138 06/13/2015   K 4.6 06/13/2015   CL 107 06/13/2015   CALCIUM 9.4 06/13/2015   MG 1.4* 06/13/2015    Pain Modulating Vitamins Lab Results  Component Value Date   VD25OH 31 02/03/2010   VITAMINB12 1002* 06/13/2015    Coagulation Parameters Lab Results  Component Value Date   INR 1.59* 02/06/2013   LABPROT 18.5* 02/06/2013   APTT 40* 02/06/2013   PLT 307 03/15/2015    Note: Labs Reviewed.  Recent Diagnostic Imaging  Mr Lumbar Spine Wo Contrast  06/21/2015  CLINICAL DATA:  Low back pain radiating to both knees for  approximately 10 years. No known injury. EXAM: MRI LUMBAR SPINE WITHOUT CONTRAST TECHNIQUE: Multiplanar, multisequence MR imaging of the lumbar spine was performed. No intravenous contrast was administered. COMPARISON:  MRI lumbar spine 06/17/2010. FINDINGS: Segmentation:  Unremarkable. Alignment:  Normal. Vertebrae: No fracture or marrow signal abnormality. Small Schmorl's nodes in the superior endplates of L1, L2 and L3 are unchanged. Conus medullaris: Extends to the T12-L1 level and appears normal. Paraspinal and other soft tissues: Unremarkable. Disc levels: T12-L1 is imaged in the sagittal plane only and negative. L1-2: Negative. L2-3: Negative. L3-4: Negative. L4-5: Mild facet degenerative change and a minimal disc bulge without central canal or foraminal stenosis. The appearance is stable. L5-S1: Shallow, broad-based central and eccentric to the left protrusion without central canal or foraminal stenosis. The appearance is unchanged. IMPRESSION: No change in mild lower lumbar degenerative disease without central canal  or foraminal narrowing. Electronically Signed   By: Inge Rise M.D.   On: 06/21/2015 10:10   Cervical Imaging: Cervical CT wo contrast:  Results for orders placed during the hospital encounter of 06/25/12  CT Cervical Spine Wo Contrast   Narrative *RADIOLOGY REPORT*  Clinical Data:  NECK PAIN.  CT HEAD WITHOUT CONTRAST CT CERVICAL SPINE WITHOUT CONTRAST  Technique:  Multidetector CT imaging of the head and cervical spine was performed following the standard protocol without IV contrast. Multiplanar CT image reconstructions of the cervical spine were also generated.  Comparison: None available  CT HEAD  Findings: Atherosclerotic and physiologic intracranial calcifications. There is no evidence of acute intracranial hemorrhage, brain edema, mass lesion, acute infarction,   mass effect, or midline shift. Acute infarct may be inapparent on noncontrast CT.  No other  intra-axial abnormalities are seen, and the ventricles and sulci are within normal limits in size and symmetry.   No abnormal extra-axial fluid collections or masses are identified.  No significant calvarial abnormality.  IMPRESSION: 1. Negative for bleed or other acute intracranial process.  CT CERVICAL SPINE  Findings: Small anterior endplate spurs and [id number] and C7-T1. Vertebral body and intervertebral disc height well maintained throughout.  Facets seated.  No prevertebral soft tissue swelling. Negative for fracture.  Bilateral carotid bifurcation calcified plaque.  Remainder paraspinal soft tissues unremarkable.  IMPRESSION:  1.  Negative for fracture or other acute bony abnormality. 2. Degenerative spurring as above. 3.  Bilateral carotid bifurcation calcified plaque.   Original Report Authenticated By: D. Wallace Going, MD    Thoracic Imaging: Thoracic MR wo contrast:  Results for orders placed during the hospital encounter of 08/05/12  MR Thoracic Spine Wo Contrast   Narrative *RADIOLOGY REPORT*  Clinical Data: Loss of bladder and bowel control.  Right-sided weakness.  MRI THORACIC SPINE WITHOUT CONTRAST  Technique:  Multiplanar and multiecho pulse sequences of the thoracic spine were obtained without intravenous contrast.  Comparison: Chest CT scan dated 12/02/2006  Findings: There are small disc bulges to the right left of midline at T3-4 and T4-5 as well as a small disc bulge to the right at T5-6 which slightly indents the ventral aspect of the spinal cord but causes no myelopathy.  There is a small disc bulge to the left of midline at C6-7 without impingement.  Tiny disc bulges to the right and left at T7-8.  Small disc protrusion to the left at T11-12 without neural impingement.  The vertebral bodies are normal.  Facet joints are normal.  No spinal or foraminal stenosis.  Paraspinal soft tissues are normal.  IMPRESSION:  1.  Multilevel degenerative disc  disease.  There is minimal indentation upon the ventral aspect of the right side of the spinal cord at T5-6 without myelopathy. Small disc protrusion to the left T11-12.  2.  No findings to explain the patient's loss of bowel and bladder control.   Original Report Authenticated By: Lorriane Shire, M.D.    Lumbosacral Imaging: Lumbar MR wo contrast:  Results for orders placed during the hospital encounter of 06/21/15  MR Lumbar Spine Wo Contrast   Narrative CLINICAL DATA:  Low back pain radiating to both knees for approximately 10 years. No known injury.  EXAM: MRI LUMBAR SPINE WITHOUT CONTRAST  TECHNIQUE: Multiplanar, multisequence MR imaging of the lumbar spine was performed. No intravenous contrast was administered.  COMPARISON:  MRI lumbar spine 06/17/2010.  FINDINGS: Segmentation:  Unremarkable.  Alignment:  Normal.  Vertebrae: No fracture or marrow  signal abnormality. Small Schmorl's nodes in the superior endplates of L1, L2 and L3 are unchanged.  Conus medullaris: Extends to the T12-L1 level and appears normal.  Paraspinal and other soft tissues: Unremarkable.  Disc levels: T12-L1 is imaged in the sagittal plane only and negative.  L1-2: Negative.  L2-3: Negative.  L3-4: Negative.  L4-5: Mild facet degenerative change and a minimal disc bulge without central canal or foraminal stenosis. The appearance is stable.  L5-S1: Shallow, broad-based central and eccentric to the left protrusion without central canal or foraminal stenosis. The appearance is unchanged.  IMPRESSION: No change in mild lower lumbar degenerative disease without central canal or foraminal narrowing.   Electronically Signed   By: Inge Rise M.D.   On: 06/21/2015 10:10    Lumbar DG (Complete) 4+V:  Results for orders placed during the hospital encounter of 06/17/10  DG Lumbar Spine Complete   Narrative *RADIOLOGY REPORT*  Clinical Data: Low back pain post fall a couple  weeks ago  LUMBAR SPINE - COMPLETE 4+ VIEW  Comparison: None  Findings: Five non-rib bearing lumbar vertebrae. Vertebral body and disc space heights maintained. No fracture, subluxation or bone destruction. Tiny anterior spurs at L3-L4 disc space. Facet degenerative changes lower lumbar spine. No spondylolysis. SI joints symmetric. Very minimal levoconvex scoliosis. Prostatic calcifications noted. Minimal scattered atherosclerotic calcifications.  IMPRESSION: Minimal degenerative disc and facet disease changes of lumbar spine as above.  Original Report Authenticated By: Burnetta Sabin, M.D.   Lumbar DG Bending views:  Results for orders placed during the hospital encounter of 06/13/15  DG Lumbar Spine Complete W/Bend   Narrative CLINICAL DATA:  History of multiple falls over the last 2 months, arthritis  EXAM: LUMBAR SPINE - COMPLETE WITH BENDING VIEWS  COMPARISON:  None.  FINDINGS: The lumbar vertebrae are in normal alignment. Very slight loss of disc space is noted at L3-4 with minimal anterior osteophyte formation. No compression deformity is seen. The remainder of intervertebral did disc spaces appear normal. The SI joints are corticated. The bowel gas pattern is nonspecific. Through flexion and extension there is only slightly limited range of motion with no malalignment noted.  IMPRESSION: 1. Normal alignment with minimally decreased disc space at L3-4. 2. Only slightly limited range of motion through flexion and extension.   Electronically Signed   By: Ivar Drape M.D.   On: 06/13/2015 14:13    Sacroiliac Joint Imaging: Sacroiliac Joint DG:  Results for orders placed during the hospital encounter of 06/13/15  DG Si Joints   Narrative CLINICAL DATA:  History of several falls over the last 2 months  EXAM: BILATERAL SACROILIAC JOINTS - 3+ VIEW  COMPARISON:  None.  FINDINGS: Both hips appear to be normally positioned with mild degenerative joint  disease of the hips right greater than left. The pelvic rami are intact. The SI joints are corticated. The sacral foramina are unremarkable.  IMPRESSION: No acute abnormality. Only mild degenerative joint disease of the hips.   Electronically Signed   By: Ivar Drape M.D.   On: 06/13/2015 14:14    Knee Imaging: Knee-R DG 1-2 views:  Results for orders placed during the hospital encounter of 06/13/15  DG Knee 1-2 Views Right   Narrative CLINICAL DATA:  History of several falls over the last 2 months  EXAM: RIGHT KNEE - 1-2 VIEW  COMPARISON:  None.  FINDINGS: The right knee joint spaces are relatively well preserved. No significant degenerative change is seen. No fracture is noted. No  joint effusion is seen. Surgical clips are present along the medial soft tissues of the right lower leg.  IMPRESSION: No acute fracture.  No effusion.   Electronically Signed   By: Ivar Drape M.D.   On: 06/13/2015 14:16    Knee-L DG 1-2 views:  Results for orders placed during the hospital encounter of 06/13/15  DG Knee 1-2 Views Left   Narrative CLINICAL DATA:  Multiple falls over the last 2 months  EXAM: LEFT KNEE - 1-2 VIEW  COMPARISON:  Left knee films of 09/18/2014  FINDINGS: No significant degenerative joint disease of the left knee is seen. The joint spaces are relatively well preserved. No fracture is seen. No joint effusion is noted.  IMPRESSION: Negative.   Electronically Signed   By: Ivar Drape M.D.   On: 06/13/2015 14:15    Knee-L DG 4 views:  Results for orders placed during the hospital encounter of 08/22/03  DG Knee Complete 4 Views Left   Narrative Clinical Data: Pain. Recent trauma.  LEFT KNEE, FOUR VIEWS   Mineralization normal. Joint spaces preserved. No fracture, dislocation, or bone destruction.   IMPRESSION  No evidence of acute injury.  Provider: Jennye Boroughs, Lavera Guise   Note: Imaging reviewed.  Meds  The patient has a current  medication list which includes the following prescription(s): alprazolam, amlodipine, amphetamine-dextroamphetamine, aspirin ec, atorvastatin, canagliflozin, carvedilol, vitamin d3, citalopram, fluticasone-salmeterol, gabapentin, insulin aspart, levemir flextouch, litetouch pen needles, magnesium oxide, meloxicam, metformin, nitroglycerin, pantoprazole, proair hfa, sacubitril-valsartan, sure comfort lancets 30g, tiotropium, truetrack test, vitamin d (ergocalciferol), and oxycodone.  Current Outpatient Prescriptions on File Prior to Visit  Medication Sig  . alprazolam (XANAX) 2 MG tablet Take 1 tablet by mouth 3 (three) times daily as needed.   Marland Kitchen amLODipine (NORVASC) 10 MG tablet Take 10 mg by mouth daily.  Marland Kitchen amphetamine-dextroamphetamine (ADDERALL) 20 MG tablet Take 20 mg by mouth 3 (three) times daily.   Marland Kitchen aspirin EC 81 MG tablet Take 1 tablet (81 mg total) by mouth daily.  Marland Kitchen atorvastatin (LIPITOR) 80 MG tablet Take 1 tablet by mouth daily.  . Canagliflozin (INVOKANA) 100 MG TABS Take 100 mg by mouth daily.  . Cholecalciferol (VITAMIN D3) 2000 units capsule Take 1 capsule (2,000 Units total) by mouth daily.  . citalopram (CELEXA) 20 MG tablet Take 1 tablet by mouth daily.  . Fluticasone-Salmeterol (ADVAIR DISKUS) 500-50 MCG/DOSE AEPB Inhale 1 puff into the lungs every 12 (twelve) hours.    . gabapentin (NEURONTIN) 600 MG tablet Take 600 mg by mouth 3 (three) times daily.  . insulin aspart (NOVOLOG FLEXPEN) 100 UNIT/ML FlexPen Inject 0-26 Units into the skin 3 (three) times daily with meals. 20 units three times a day with sliding scale max of 26 units  . Magnesium Oxide 500 MG CAPS Take 1 capsule (500 mg total) by mouth 2 (two) times daily at 8 am and 10 pm.  . meloxicam (MOBIC) 15 MG tablet Take 15 mg by mouth 2 (two) times daily.  . metFORMIN (GLUCOPHAGE) 1000 MG tablet Take 1,000 mg by mouth 2 (two) times daily with a meal.  . nitroGLYCERIN (NITROSTAT) 0.4 MG SL tablet Place 1 tablet (0.4 mg  total) under the tongue every 5 (five) minutes as needed for chest pain.  . pantoprazole (PROTONIX) 40 MG tablet TAKE ONE TABLET BY MOUTH TWICE DAILY  . PROAIR HFA 108 (90 BASE) MCG/ACT inhaler Inhale 2 puffs into the lungs every 4 (four) hours as needed.  . sacubitril-valsartan (ENTRESTO) 97-103  MG Take 1 tablet by mouth 2 (two) times daily.  Marland Kitchen tiotropium (SPIRIVA) 18 MCG inhalation capsule Place 18 mcg into inhaler and inhale daily.    . Vitamin D, Ergocalciferol, (DRISDOL) 50000 units CAPS capsule Take 1 capsule (50,000 Units total) by mouth 2 (two) times a week. X 6 weeks.   No current facility-administered medications on file prior to visit.    ROS  Constitutional: Denies any fever or chills Gastrointestinal: No reported hemesis, hematochezia, vomiting, or acute GI distress Musculoskeletal: Denies any acute onset joint swelling, redness, loss of ROM, or weakness Neurological: No reported episodes of acute onset apraxia, aphasia, dysarthria, agnosia, amnesia, paralysis, loss of coordination, or loss of consciousness  Allergies  Mr. Goding is allergic to tylenol; lodine; and propoxyphene n-acetaminophen.  [place]  Medical:  Mr. Gucciardo  has a past medical history of Anxiety; Pulmonary nodule; Hyperlipidemia; COPD (chronic obstructive pulmonary disease) ([place]); Chronic back pain; Obesity; Diabetes mellitus, type 2 ([place]); Hypertension; Cellulitis (2011); GERD (gastroesophageal reflux disease); Claustrophobia; Diverticulitis of colon (2008); and Heart attack ([place]). Family: family history includes Cancer in his mother; Coronary artery disease in his mother and sister; Diabetes in his sister; Heart attack in his brother and father. There is no history of Colon cancer. Surgical:  has past surgical history that includes Appendectomy (2007); Tonsillectomy; Coronary artery bypass graft (N/A, 02/06/2013); and left heart catheterization with coronary angiogram (N/A, 02/03/2013). Tobacco:  reports that he  quit smoking about 26 years ago. His smoking use included Cigarettes. He started smoking about 48 years ago. He has a 28.5 pack-year smoking history. He has never used smokeless tobacco. Alcohol:  reports that he does not drink alcohol. Drug:  reports that he uses illicit drugs (Marijuana).  Constitutional Exam  Vitals: Blood pressure 129/63, height 5\' 9"  (1.753 m), weight 235 lb (106.595 kg). General appearance: Well nourished, well developed, and well hydrated. In no acute distress Calculated BMI/Body habitus: Body mass index is 34.69 kg/(m^2). (30-34.9 kg/m2) Obese (Class I) - 68% higher incidence of chronic pain Psych/Mental status: Alert and oriented x 3 (person, place, & time) Eyes: PERLA Respiratory: No evidence of acute respiratory distress  Cervical Spine Exam  Inspection: No masses, redness, or swelling Alignment: Symmetrical ROM: Functional: ROM is within functional limits [facility]) Stability: No instability detected Muscle strength & Tone: Functionally intact Sensory: Unimpaired Palpation: No complaints of tenderness  Upper Extremity (UE) Exam    Side: Right upper extremity  Side: Left upper extremity  Inspection: No masses, redness, swelling, or asymmetry  Inspection: No masses, redness, swelling, or asymmetry  ROM:  ROM:  Functional: ROM is within functional limits [facility])  Functional: ROM is within functional limits [facility])  Muscle strength & Tone: Functionally intact  Muscle strength & Tone: Functionally intact  Sensory: Unimpaired  Sensory: Unimpaired  Palpation: Non-contributory  Palpation: Non-contributory   Thoracic Spine Exam  Inspection: No masses, redness, or swelling Alignment: Symmetrical ROM: Functional: ROM is within functional limits [facility]) Stability: No instability detected Sensory: Unimpaired Muscle strength & Tone: Functionally intact Palpation: No complaints of tenderness  Lumbar Spine Exam  Inspection: No masses, redness, or swelling Alignment:  Symmetrical ROM: Functional: ROM is within functional limits [facility]) Stability: No instability detected Muscle strength & Tone: Functionally intact Sensory: Unimpaired Palpation: No complaints of tenderness Provocative Tests: Lumbar Hyperextension and rotation test: deferred Patrick's Maneuver: deferred  Gait & Posture Assessment  Ambulation: Unassisted Gait: Unaffected Posture: WNL  Lower Extremity Exam    Side: Right lower extremity  Side: Left  lower extremity  Inspection: No masses, redness, swelling, or asymmetry ROM:  Inspection: No masses, redness, swelling, or asymmetry ROM:  Functional: ROM is within functional limits [facility])  Functional: ROM is within functional limits [facility])  Muscle strength & Tone: Functionally intact  Muscle strength & Tone: Functionally intact  Sensory: Unimpaired  Sensory: Unimpaired  Palpation: Non-contributory  Palpation: Non-contributory   Assessment & Plan  Primary Diagnosis & Pertinent Problem List: The primary encounter diagnosis was Chronic pain. Diagnoses of Encounter for therapeutic drug level monitoring, Opiate use (90 MME/Day), Chronic knee pain, unspecified laterality, Primary osteoarthritis of both knees, Chronic lumbar radicular pain (L5 dermatome) (Location of Tertiary source of pain) (Left), Chronic low back pain (Location of Secondary source of pain) (Bilateral) (L>R), 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) (L>R), Lumbar facet syndrome (Location of Secondary source of pain) (Bilateral) (L>R), and Lumbar spondylosis, unspecified spinal osteoarthritis were also pertinent to this visit.  Visit Diagnosis: 1. Chronic pain   2. Encounter for therapeutic drug level monitoring   3. Opiate use (90 MME/Day)   4. Chronic knee pain, unspecified laterality   5. Primary osteoarthritis of both knees   6. Chronic lumbar radicular pain (L5 dermatome) (Location of Tertiary source of pain) (Left)   7. Chronic low  back pain (Location of Secondary source of pain) (Bilateral) (L>R)   8. 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) (L>R)   9. Lumbar facet syndrome (Location of Secondary source of pain) (Bilateral) (L>R)   10. Lumbar spondylosis, unspecified spinal osteoarthritis     Problems updated and reviewed during this visit: No problems updated.  Problem-specific Plan(s): No problem-specific assessment & plan notes found for this encounter.  No new assessment & plan notes have been filed under this hospital service since the last note was generated. Service: Pain Management   Plan of Care   Problem List Items Addressed This Visit      High   Chronic knee pain (Location of Primary Source of Pain) (Bilateral) (L>R) (Chronic)   Relevant Orders   GENICULAR NERVE BLOCK   KNEE INJECTION   Chronic low back pain (Location of Secondary source of pain) (Bilateral) (L>R) (Chronic)   Relevant Medications   oxyCODONE (ROXICODONE) 15 MG immediate release tablet   Other Relevant Orders   LUMBAR FACET(MEDIAL BRANCH NERVE BLOCK) MBNB   SACROILIAC JOINT INJECTINS   Chronic lumbar radicular pain (L5 dermatome) (Location of Tertiary source of pain) (Left) (Chronic)   Relevant Orders   LUMBAR EPIDURAL STEROID INJECTION   LUMBAR EPIDURAL STEROID INJECTION   Chronic pain - Primary (Chronic)   Relevant Medications   oxyCODONE (ROXICODONE) 15 MG immediate release tablet   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) (L>R) (Chronic)   Relevant Medications   oxyCODONE (ROXICODONE) 15 MG immediate release tablet   Other Relevant Orders   SACROILIAC JOINT INJECTINS   Lumbar facet syndrome (Location of Secondary source of pain) (Bilateral) (L>R) (Chronic)   Relevant Medications   oxyCODONE (ROXICODONE) 15 MG immediate release tablet   Other Relevant Orders   LUMBAR FACET(MEDIAL BRANCH NERVE BLOCK) MBNB   Lumbar spondylosis (Chronic)    Relevant Medications   oxyCODONE (ROXICODONE) 15 MG immediate release tablet   Other Relevant Orders   LUMBAR FACET(MEDIAL BRANCH NERVE BLOCK) MBNB   Osteoarthritis of knee (Bilateral) (L>R) (Chronic)   Relevant Medications   oxyCODONE (ROXICODONE) 15 MG immediate release tablet   Other Relevant Orders  GENICULAR NERVE BLOCK   KNEE INJECTION     Medium   Encounter for therapeutic drug level monitoring   Opiate use (90 MME/Day)       Pharmacotherapy (Medications Ordered): Meds ordered this encounter  Medications  . oxyCODONE (ROXICODONE) 15 MG immediate release tablet    Sig: Take 1 tablet (15 mg total) by mouth every 6 (six) hours as needed for pain.    Dispense:  120 tablet    Refill:  0    Do not place this medication, or any other prescription from our practice, on "Automatic Refill". Patient may have prescription filled one day early if pharmacy is closed on scheduled refill date. Do not fill until:  To last until:    [facility] & Procedure Ordered: Orders Placed This Encounter  Procedures  . LUMBAR EPIDURAL STEROID INJECTION  . GENICULAR NERVE BLOCK  . KNEE INJECTION  . LUMBAR FACET(MEDIAL BRANCH NERVE BLOCK) MBNB  . SACROILIAC JOINT INJECTINS  . LUMBAR EPIDURAL STEROID INJECTION    Imaging Ordered: None  Interventional Therapies: Scheduled:  None at this time.    Considering:   1. Diagnostic bilateral intra-articular knee injection, without fluoroscopic guidance or IV sedation.  2. Diagnostic bilateral genicular nerve block under fluoroscopic guidance, with a without sedation.  3. Possible bilateral genicular nerve radiofrequency ablation under fluoroscopic guidance and IV sedation, the pending on the results of the diagnostic injection.  4. Diagnostic bilateral lumbar facet block under fluoroscopic guidance and IV sedation.  5. Possible bilateral lumbar facet radiofrequency ablation under fluoroscopic guidance and IV sedation depending on the results of the  diagnostic injection.  6. Diagnostic left-sided L4-5 lumbar epidural steroid injection under fluoroscopic guidance, with or without IV sedation.  7. Diagnostic bilateral sacroiliac joint block under fluoroscopic guidance, with or without sedation.  8. Possible bilateral sacroiliac joint radiofrequency ablation under fluoroscopic guidance and IV sedation, the pending on the results of the diagnostic injection.    PRN Procedures:   1. Diagnostic bilateral intra-articular knee injection, without fluoroscopic guidance or IV sedation.  2. Diagnostic bilateral genicular nerve block under fluoroscopic guidance, with a without sedation.  3. Diagnostic bilateral lumbar facet block under fluoroscopic guidance and IV sedation.  4. Diagnostic left-sided L4-5 lumbar epidural steroid injection under fluoroscopic guidance, with or without IV sedation.  5. Diagnostic bilateral sacroiliac joint block under fluoroscopic guidance, with or without sedation.    Referral(s) or Consult(s): None at this time.  New Prescriptions   OXYCODONE (ROXICODONE) 15 MG IMMEDIATE RELEASE TABLET    Take 1 tablet (15 mg total) by mouth every 6 (six) hours as needed for pain.    Medications administered during this visit: Mr. Cape had no medications administered during this visit.  Requested PM Follow-up: Return in about 4 weeks (around 08/05/2015) for Procedure (ASAP), Medication Management, (1-Mo).  Future Appointments Date Time Provider [place]  08/02/2015 9:20 AM Milinda Pointer, MD [facility] None  08/06/2015 1:30 PM Milinda Pointer, MD [facility] None    Primary Care Physician: [facility] Location: [facility] Note by: Kathlen Brunswick. Dossie Arbour, M.D, DABA, DABAPM, DABPM, DABIPP, FIPP  Pain Score Disclaimer: We use the NRS-11 scale. This is a self-reported, subjective measurement of pain severity with only modest accuracy. It is used primarily to  identify changes within a particular patient. It must be understood that outpatient pain scales are significantly less accurate that those used for research, where they can be applied under ideal controlled circumstances with  minimal exposure to variables. In reality, the score is likely to be a combination of pain intensity and pain affect, where pain affect describes the degree of emotional arousal or changes in action readiness caused by the sensory experience of pain. Factors such as social and work situation, setting, emotional state, anxiety levels, expectation, and prior pain experience may influence pain perception and show large inter-individual differences that may also be affected by time variables.  Patient instructions provided during this appointment: Patient Instructions  Epidural Steroid Injection Patient Information  Description: The epidural space surrounds the nerves as they exit the spinal cord.  In some patients, the nerves can be compressed and inflamed by a bulging disc or a tight spinal canal (spinal stenosis).  By injecting steroids into the epidural space, we can bring irritated nerves into direct contact with a potentially helpful medication.  These steroids act directly on the irritated nerves and can reduce swelling and inflammation which often leads to decreased pain.  Epidural steroids may be injected anywhere along the spine and from the neck to the low back depending upon the location of your pain.   After numbing the skin with local anesthetic (like Novocaine), a small needle is passed into the epidural space slowly.  You may experience a sensation of pressure while this is being done.  The entire block usually last less than 10 minutes.  Conditions which may be treated by epidural steroids:   Low back and leg pain  Neck and arm pain  Spinal stenosis  Post-laminectomy syndrome  Herpes zoster (shingles) pain  Pain from compression fractures  Preparation for  the injection:  1. Do not eat any solid food or dairy products within 8 hours of your appointment.  2. You may drink clear liquids up to 3 hours before appointment.  Clear liquids include water, black coffee, juice or soda.  No milk or cream please. 3. You may take your regular medication, including pain medications, with a sip of water before your appointment  Diabetics should hold regular insulin (if taken separately) and take 1/2 normal NPH dos the morning of the procedure.  Carry some sugar containing items with you to your appointment. 4. A driver must accompany you and be prepared to drive you home after your procedure.  5. Bring all your current medications with your. 6. An IV may be inserted and sedation may be given at the discretion of the physician.   7. A blood pressure cuff, EKG and other monitors will often be applied during the procedure.  Some patients may need to have extra oxygen administered for a short period. 8. You will be asked to provide medical information, including your allergies, prior to the procedure.  We must know immediately if you are taking blood thinners (like Coumadin/Warfarin)  Or if you are allergic to IV iodine contrast (dye). We must know if you could possible be pregnant.  Possible side-effects:  Bleeding from needle site  Infection (rare, may require surgery)  Nerve injury (rare)  Numbness & tingling (temporary)  Difficulty urinating (rare, temporary)  Spinal headache ( a headache worse with upright posture)  Light -headedness (temporary)  Pain at injection site (several days)  Decreased blood pressure (temporary)  Weakness in arm/leg (temporary)  Pressure sensation in back/neck (temporary)  Call if you experience:  Fever/chills associated with headache or increased back/neck pain.  Headache worsened by an upright position.  New onset weakness or numbness of an extremity below the injection site  Hives or  difficulty breathing (go to  the emergency room)  Inflammation or drainage at the infection site  Severe back/neck pain  Any new symptoms which are concerning to you  Please note:  Although the local anesthetic injected can often make your back or neck feel good for several hours after the injection, the pain will likely return.  It takes 3-7 days for steroids to work in the epidural space.  You may not notice any pain relief for at least that one week.  If effective, we will often do a series of three injections spaced 3-6 weeks apart to maximally decrease your pain.  After the initial series, we generally will wait several months before considering a repeat injection of the same type.  If you have any questions, please call [phone] [facility] Prescription given for roxicodone. Please follow above instructions to prepare for your procedure

## 2015-08-02 ENCOUNTER — Ambulatory Visit: Payer: Medicare Other | Attending: Pain Medicine | Admitting: Pain Medicine

## 2015-08-02 ENCOUNTER — Encounter: Payer: Self-pay | Admitting: Pain Medicine

## 2015-08-02 VITALS — BP 156/72 | HR 82 | Temp 98.0°F | Resp 16 | Ht 69.0 in | Wt 242.0 lb

## 2015-08-02 DIAGNOSIS — K219 Gastro-esophageal reflux disease without esophagitis: Secondary | ICD-10-CM | POA: Diagnosis not present

## 2015-08-02 DIAGNOSIS — E119 Type 2 diabetes mellitus without complications: Secondary | ICD-10-CM | POA: Insufficient documentation

## 2015-08-02 DIAGNOSIS — I5022 Chronic systolic (congestive) heart failure: Secondary | ICD-10-CM | POA: Insufficient documentation

## 2015-08-02 DIAGNOSIS — Z79899 Other long term (current) drug therapy: Secondary | ICD-10-CM | POA: Diagnosis not present

## 2015-08-02 DIAGNOSIS — I10 Essential (primary) hypertension: Secondary | ICD-10-CM | POA: Insufficient documentation

## 2015-08-02 DIAGNOSIS — J449 Chronic obstructive pulmonary disease, unspecified: Secondary | ICD-10-CM | POA: Insufficient documentation

## 2015-08-02 DIAGNOSIS — M170 Bilateral primary osteoarthritis of knee: Secondary | ICD-10-CM | POA: Diagnosis not present

## 2015-08-02 DIAGNOSIS — E669 Obesity, unspecified: Secondary | ICD-10-CM | POA: Insufficient documentation

## 2015-08-02 DIAGNOSIS — G8929 Other chronic pain: Secondary | ICD-10-CM | POA: Insufficient documentation

## 2015-08-02 DIAGNOSIS — I214 Non-ST elevation (NSTEMI) myocardial infarction: Secondary | ICD-10-CM | POA: Diagnosis not present

## 2015-08-02 DIAGNOSIS — Z87898 Personal history of other specified conditions: Secondary | ICD-10-CM | POA: Insufficient documentation

## 2015-08-02 DIAGNOSIS — I2510 Atherosclerotic heart disease of native coronary artery without angina pectoris: Secondary | ICD-10-CM | POA: Diagnosis not present

## 2015-08-02 DIAGNOSIS — E785 Hyperlipidemia, unspecified: Secondary | ICD-10-CM | POA: Diagnosis not present

## 2015-08-02 DIAGNOSIS — M545 Low back pain: Secondary | ICD-10-CM | POA: Diagnosis not present

## 2015-08-02 DIAGNOSIS — Z951 Presence of aortocoronary bypass graft: Secondary | ICD-10-CM | POA: Diagnosis not present

## 2015-08-02 DIAGNOSIS — M25569 Pain in unspecified knee: Secondary | ICD-10-CM | POA: Insufficient documentation

## 2015-08-02 DIAGNOSIS — M549 Dorsalgia, unspecified: Secondary | ICD-10-CM | POA: Diagnosis present

## 2015-08-02 DIAGNOSIS — Z79891 Long term (current) use of opiate analgesic: Secondary | ICD-10-CM | POA: Insufficient documentation

## 2015-08-02 DIAGNOSIS — F1190 Opioid use, unspecified, uncomplicated: Secondary | ICD-10-CM | POA: Diagnosis not present

## 2015-08-02 DIAGNOSIS — E559 Vitamin D deficiency, unspecified: Secondary | ICD-10-CM | POA: Insufficient documentation

## 2015-08-02 DIAGNOSIS — M5416 Radiculopathy, lumbar region: Secondary | ICD-10-CM | POA: Insufficient documentation

## 2015-08-02 DIAGNOSIS — E1140 Type 2 diabetes mellitus with diabetic neuropathy, unspecified: Secondary | ICD-10-CM | POA: Insufficient documentation

## 2015-08-02 DIAGNOSIS — F419 Anxiety disorder, unspecified: Secondary | ICD-10-CM | POA: Diagnosis not present

## 2015-08-02 DIAGNOSIS — M79606 Pain in leg, unspecified: Secondary | ICD-10-CM

## 2015-08-02 DIAGNOSIS — R911 Solitary pulmonary nodule: Secondary | ICD-10-CM | POA: Diagnosis not present

## 2015-08-02 MED ORDER — OXYCODONE HCL 15 MG PO TABS: 15.0000 mg | ORAL_TABLET | Freq: Four times a day (QID) | ORAL | Status: DC | PRN

## 2015-08-02 NOTE — Progress Notes (Signed)
Patient's Name: Manuel Knight  Patient type: Established  MRN: [id number]  Service setting: Ambulatory outpatient  DOB: [birth year]  Location: [facility]  DOS: 08/02/2015  Primary Care Physician: [facility]  Note by: Beatriz Chancellor A. Dossie Arbour, M.D, DABA, DABAPM, DABPM, DABIPP, FIPP  Referring Physician: [facility]*  Specialty: Board-Certified Interventional Pain Management  Last Visit to Pain Management: 07/08/2015   Primary Reason(s) for Visit: Encounter for prescription drug management (Level of risk: moderate) CC: Back Pain and Knee Pain   HPI  Manuel Knight is a 55 y.o. year old, male patient, who returns today as an established patient. He has Type 2 diabetes mellitus with hyperglycemia, with long-term current use of insulin ([place]); HLD (hyperlipidemia); Obesity, unspecified; Anxiety state; Essential hypertension; COPD; PULMONARY NODULE; HERNIA; GERD (gastroesophageal reflux disease); Esophageal dysphagia; Chronic diarrhea; Accelerated hypertension; NSTEMI (non-ST elevated myocardial infarction) ([place]); CAD (coronary artery disease), native coronary artery; S/P CABG x 3; Chronic systolic heart failure ([place]); Emphysema of lung ([place]); Disorder of joint of spine; H/O coronary artery bypass surgery; Chronic pain; Encounter for pain management planning; Encounter for therapeutic drug level monitoring; Opiate use (90 MME/Day); Chronic low back pain (Location of Secondary source of pain) (Bilateral) (L>R); Chronic knee pain (Location of Primary Source of Pain) (Bilateral) (L>R); Chronic foot pain (Bilateral) (L>R); Disturbance of skin sensation; Diabetic peripheral neuropathy (HCC) (lower extremity); Chronic lumbar radicular pain (L5 dermatome) (Location of Tertiary source of pain) (Left); 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) (L>R); Osteoarthritis of knee (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 His primarily concern today is the Back Pain and Knee Pain   Pain Assessment: Self-Reported Pain Score: 4              Reported level is compatible with observation       Pain Type: Chronic pain Pain Location: Back (knee) Pain Orientation: Lower, Right (right) Pain Descriptors / Indicators: Constant, Throbbing, Sharp, Shooting Pain Frequency: Constant  The patient comes into the clinics today for pharmacological management of his chronic pain. I last saw this patient on 55/26/2017. The patient  reports that he uses illicit drugs (Marijuana). His body mass index is 35.72 kg/(m^2).  Date of Last Visit: 07/08/15 Service Provided on Last Visit: Med Refill  Controlled Substance Pharmacotherapy Assessment & REMS (Risk Evaluation and Mitigation Strategy)  Analgesic: Oxycodone IR 15 mg every 6 hours (60 mg/day) MME/day: 90 mg/day. Pill Count: Oxycodone HCL 15 mg tablets last fill 07/08/2015 16/120 Pharmacokinetics: Onset of action (Liberation/Absorption): Within expected pharmacological parameters Time to Peak effect (Distribution): Timing and results are as within normal expected parameters Duration of action (Metabolism/Excretion): Within normal limits for medication Pharmacodynamics: Analgesic Effect: More than 50% Activity Facilitation: Medication(s) allow patient to sit, stand, walk, and do the basic ADLs Perceived Effectiveness: Described as relatively effective, allowing for increase in activities of daily living (ADL) Side-effects or Adverse reactions: None reported Monitoring: [place] PMP: Online review of the past 48-month period conducted.  Compliant with practice rules and regulations All Historical UDS testing(s):  Lab Results  Component Value Date   COCAINSCRNUR NONE DETECTED 08/05/2012   COCAINSCRNUR NONE DETECTED 05/22/2011   COCAINSCRNUR NEG  02/10/2010   COCAINSCRNUR NONE DETECTED 11/21/2008   COCAINSCRNUR NONE DETECTED 11/23/2006   PCPSCRNUR NEG 02/10/2010   THCU NONE DETECTED 08/05/2012   THCU POSITIVE* 05/22/2011   THCU POSITIVE* 11/21/2008   THCU POSITIVE* 11/23/2006   Initial UDS Testing:  SUMMARY  Date Value Ref Range Status  06/13/2015 FINAL  Final    Comment:    ==================================================================== TOXASSURE COMP DRUG ANALYSIS,UR ==================================================================== Test                             Result       Flag       Units Drug Present and Declared for Prescription Verification   Citalopram                     PRESENT      EXPECTED   Desmethylcitalopram            PRESENT      EXPECTED    Desmethylcitalopram is an expected metabolite of citalopram or    the enantiomeric form, escitalopram. Drug Present not Declared for Prescription Verification   Alcohol, Ethyl                 0.381        UNEXPECTED g/dL    Sources of ethyl alcohol include alcoholic beverages or as a    fermentation product of glucose; glucose is present in this    specimen.  The high concentration of ethyl alcohol and the    presence of glucose supports fermentation as the source of ethyl    alcohol in this specimen. Drug Absent but Declared for Prescription Verification   Amphetamine                    Not Detected UNEXPECTED ng/mg creat   Alprazolam                     Not Detected UNEXPECTED ng/mg creat   Salicylate                     Not Detected UNEXPECTED    Aspirin, as indicated in the declared medication list, is not    always detected even when used as directed. ==================================================================== Test                       Result    Flag   Units      Ref Range   Creatinine              80               mg/dL      >=20 ==================================================================== Declared Medications:  The flagging and interpretation on this report are based on the  following declared medications.  Unexpected results may arise from  inaccuracies in the declared medications.  **Note: The testing scope of this panel includes these medications:  Alprazolam (Xanax)  Amphetamine (Adderall)  Citalopram (Celexa)  **Note: The testing scope of this panel does not include small to  moderate amounts of these reported medications:  Aspirin (Aspirin 81)  **Note: The testing scope of this panel does not include following  reported medications:  Albuterol (ProAir HFA)  Amlodipine (Norvasc)  Atorvastatin (Lipitor)  Canagliflozin (Invokana)  Fluticasone (Advair)  Insulin (NovoLog)  Meloxicam (Mobic)  Metformin (Glucophage)  Mupirocin (Bactroban)  Nitroglycerin (Nitrostat)  Pantoprazole (Protonix)  Sacubitril (Entresto)  Salmeterol (Advair)  Tiotropium (Spiriva)  Valsartan (Entresto) ==================================================================== For clinical consultation, please call [phone]. ====================================================================    List of UDS test(s) done:  Lab Results  Component Value Date   SUMMARY FINAL 06/13/2015   UDS interpretation: Compliant          Medication Assessment Form: Reviewed. Patient indicates being compliant with therapy Treatment compliance: Compliant Risk Assessment: Aberrant Behavior: None observed today Substance Use Disorder (SUD) Risk Level: High Risk of opioid abuse or dependence: 0.7-3.0% with doses ? 36 MME/day and 6.1-26% with doses ? 120 MME/day. Opioid Risk Tool (ORT) Score: Total Score: 8 High Risk for Opioid Abuse (Score >8) Depression Scale Score: PHQ-2: PHQ-2 Total Score: 0 No depression (0) PHQ-9: PHQ-9 Total  Score: 0 No depression (0-4)  Pharmacologic Plan: No change in therapy, at this time  Laboratory Chemistry  Inflammation Markers Lab Results  Component Value Date   ESRSEDRATE 2 06/13/2015   CRP 0.8 06/13/2015    Renal Function Lab Results  Component Value Date   BUN 15 06/13/2015   CREATININE 0.98 06/13/2015   GFRAA >60 06/13/2015   GFRNONAA >60 06/13/2015    Hepatic Function Lab Results  Component Value Date   AST 17 06/13/2015   ALT 19 06/13/2015   ALBUMIN 4.0 06/13/2015    Electrolytes Lab Results  Component Value Date   NA 138 06/13/2015   K 4.6 06/13/2015   CL 107 06/13/2015   CALCIUM 9.4 06/13/2015   MG 1.4* 06/13/2015    Pain Modulating Vitamins Lab Results  Component Value Date   VD25OH 31 02/03/2010   25OHVITD1 25* 06/13/2015   25OHVITD2 <1.0 06/13/2015   25OHVITD3 25 06/13/2015   VITAMINB12 1002* 06/13/2015    Coagulation Parameters Lab Results  Component Value Date   INR 1.59* 02/06/2013   LABPROT 18.5* 02/06/2013   APTT 40* 02/06/2013   PLT 307 03/15/2015    Cardiovascular Lab Results  Component Value Date   HGB 14.5 03/15/2015   HCT 45.5 03/15/2015    Note: Lab results reviewed.  Recent Diagnostic Imaging  Mr Lumbar Spine Wo Contrast  06/21/2015  CLINICAL DATA:  Low back pain radiating to both knees for approximately 10 years. No known injury. EXAM: MRI LUMBAR SPINE WITHOUT CONTRAST TECHNIQUE: Multiplanar, multisequence MR imaging of the lumbar spine was performed. No intravenous contrast was administered. COMPARISON:  MRI lumbar spine 06/17/2010. FINDINGS: Segmentation:  Unremarkable. Alignment:  Normal. Vertebrae: No fracture or marrow signal abnormality. Small Schmorl's nodes in the superior endplates of L1, L2 and L3 are unchanged. Conus medullaris: Extends to the T12-L1 level and appears normal. Paraspinal and other soft tissues: Unremarkable. Disc levels: T12-L1 is imaged in the sagittal plane only and negative. L1-2: Negative.  L2-3: Negative. L3-4: Negative. L4-5: Mild facet degenerative change and a minimal disc bulge without central canal or foraminal stenosis. The appearance is stable. L5-S1: Shallow, broad-based central and eccentric to the left protrusion without central canal or foraminal stenosis. The appearance is unchanged. IMPRESSION: No change in mild lower lumbar degenerative disease without central canal or foraminal narrowing. Electronically Signed   By: Inge Rise M.D.   On: 06/21/2015 10:10   Cervical Imaging: Cervical CT wo contrast:  Results for orders placed during the hospital encounter of 06/25/12  CT Cervical Spine Wo Contrast   Narrative *RADIOLOGY REPORT*  Clinical Data:  NECK PAIN.  CT HEAD WITHOUT CONTRAST CT CERVICAL SPINE WITHOUT CONTRAST  Technique:  Multidetector CT imaging of the head and cervical spine was  performed following the standard protocol without IV contrast. Multiplanar CT image reconstructions of the cervical spine were also generated.  Comparison: None available  CT HEAD  Findings: Atherosclerotic and physiologic intracranial calcifications. There is no evidence of acute intracranial hemorrhage, brain edema, mass lesion, acute infarction,   mass effect, or midline shift. Acute infarct may be inapparent on noncontrast CT.  No other intra-axial abnormalities are seen, and the ventricles and sulci are within normal limits in size and symmetry.   No abnormal extra-axial fluid collections or masses are identified.  No significant calvarial abnormality.  IMPRESSION: 1. Negative for bleed or other acute intracranial process.  CT CERVICAL SPINE  Findings: Small anterior endplate spurs and [id number] and C7-T1. Vertebral body and intervertebral disc height well maintained throughout.  Facets seated.  No prevertebral soft tissue swelling. Negative for fracture.  Bilateral carotid bifurcation calcified plaque.  Remainder paraspinal soft tissues  unremarkable.  IMPRESSION:  1.  Negative for fracture or other acute bony abnormality. 2. Degenerative spurring as above. 3.  Bilateral carotid bifurcation calcified plaque.   Original Report Authenticated By: D. Wallace Going, MD    Thoracic Imaging: Thoracic MR wo contrast:  Results for orders placed during the hospital encounter of 08/05/12  MR Thoracic Spine Wo Contrast   Narrative *RADIOLOGY REPORT*  Clinical Data: Loss of bladder and bowel control.  Right-sided weakness.  MRI THORACIC SPINE WITHOUT CONTRAST  Technique:  Multiplanar and multiecho pulse sequences of the thoracic spine were obtained without intravenous contrast.  Comparison: Chest CT scan dated 12/02/2006  Findings: There are small disc bulges to the right left of midline at T3-4 and T4-5 as well as a small disc bulge to the right at T5-6 which slightly indents the ventral aspect of the spinal cord but causes no myelopathy.  There is a small disc bulge to the left of midline at C6-7 without impingement.  Tiny disc bulges to the right and left at T7-8.  Small disc protrusion to the left at T11-12 without neural impingement.  The vertebral bodies are normal.  Facet joints are normal.  No spinal or foraminal stenosis.  Paraspinal soft tissues are normal.  IMPRESSION:  1.  Multilevel degenerative disc disease.  There is minimal indentation upon the ventral aspect of the right side of the spinal cord at T5-6 without myelopathy. Small disc protrusion to the left T11-12.  2.  No findings to explain the patient's loss of bowel and bladder control.   Original Report Authenticated By: Lorriane Shire, M.D.    Lumbosacral Imaging: Lumbar MR wo contrast:  Results for orders placed during the hospital encounter of 06/21/15  MR Lumbar Spine Wo Contrast   Narrative CLINICAL DATA:  Low back pain radiating to both knees for approximately 10 years. No known injury.  EXAM: MRI LUMBAR SPINE WITHOUT  CONTRAST  TECHNIQUE: Multiplanar, multisequence MR imaging of the lumbar spine was performed. No intravenous contrast was administered.  COMPARISON:  MRI lumbar spine 06/17/2010.  FINDINGS: Segmentation:  Unremarkable.  Alignment:  Normal.  Vertebrae: No fracture or marrow signal abnormality. Small Schmorl's nodes in the superior endplates of L1, L2 and L3 are unchanged.  Conus medullaris: Extends to the T12-L1 level and appears normal.  Paraspinal and other soft tissues: Unremarkable.  Disc levels: T12-L1 is imaged in the sagittal plane only and negative.  L1-2: Negative.  L2-3: Negative.  L3-4: Negative.  L4-5: Mild facet degenerative change and a minimal disc bulge without central canal or foraminal stenosis. The appearance is  stable.  L5-S1: Shallow, broad-based central and eccentric to the left protrusion without central canal or foraminal stenosis. The appearance is unchanged.  IMPRESSION: No change in mild lower lumbar degenerative disease without central canal or foraminal narrowing.   Electronically Signed   By: Inge Rise M.D.   On: 06/21/2015 10:10    Lumbar DG (Complete) 4+V:  Results for orders placed during the hospital encounter of 06/17/10  DG Lumbar Spine Complete   Narrative *RADIOLOGY REPORT*  Clinical Data: Low back pain post fall a couple weeks ago  LUMBAR SPINE - COMPLETE 4+ VIEW  Comparison: None  Findings: Five non-rib bearing lumbar vertebrae. Vertebral body and disc space heights maintained. No fracture, subluxation or bone destruction. Tiny anterior spurs at L3-L4 disc space. Facet degenerative changes lower lumbar spine. No spondylolysis. SI joints symmetric. Very minimal levoconvex scoliosis. Prostatic calcifications noted. Minimal scattered atherosclerotic calcifications.  IMPRESSION: Minimal degenerative disc and facet disease changes of lumbar spine as above.  Original Report Authenticated By: Burnetta Sabin, M.D.   Lumbar DG Bending views:  Results for orders placed during the hospital encounter of 06/13/15  DG Lumbar Spine Complete W/Bend   Narrative CLINICAL DATA:  History of multiple falls over the last 2 months, arthritis  EXAM: LUMBAR SPINE - COMPLETE WITH BENDING VIEWS  COMPARISON:  None.  FINDINGS: The lumbar vertebrae are in normal alignment. Very slight loss of disc space is noted at L3-4 with minimal anterior osteophyte formation. No compression deformity is seen. The remainder of intervertebral did disc spaces appear normal. The SI joints are corticated. The bowel gas pattern is nonspecific. Through flexion and extension there is only slightly limited range of motion with no malalignment noted.  IMPRESSION: 1. Normal alignment with minimally decreased disc space at L3-4. 2. Only slightly limited range of motion through flexion and extension.   Electronically Signed   By: Ivar Drape M.D.   On: 06/13/2015 14:13    Sacroiliac Joint Imaging: Sacroiliac Joint DG:  Results for orders placed during the hospital encounter of 06/13/15  DG Si Joints   Narrative CLINICAL DATA:  History of several falls over the last 2 months  EXAM: BILATERAL SACROILIAC JOINTS - 3+ VIEW  COMPARISON:  None.  FINDINGS: Both hips appear to be normally positioned with mild degenerative joint disease of the hips right greater than left. The pelvic rami are intact. The SI joints are corticated. The sacral foramina are unremarkable.  IMPRESSION: No acute abnormality. Only mild degenerative joint disease of the hips.   Electronically Signed   By: Ivar Drape M.D.   On: 06/13/2015 14:14    Knee Imaging: Knee-R DG 1-2 views:  Results for orders placed during the hospital encounter of 06/13/15  DG Knee 1-2 Views Right   Narrative CLINICAL DATA:  History of several falls over the last 2 months  EXAM: RIGHT KNEE - 1-2 VIEW  COMPARISON:  None.  FINDINGS: The right knee  joint spaces are relatively well preserved. No significant degenerative change is seen. No fracture is noted. No joint effusion is seen. Surgical clips are present along the medial soft tissues of the right lower leg.  IMPRESSION: No acute fracture.  No effusion.   Electronically Signed   By: Ivar Drape M.D.   On: 06/13/2015 14:16    Knee-L DG 1-2 views:  Results for orders placed during the hospital encounter of 06/13/15  DG Knee 1-2 Views Left   Narrative CLINICAL DATA:  Multiple falls over the  last 2 months  EXAM: LEFT KNEE - 1-2 VIEW  COMPARISON:  Left knee films of 09/18/2014  FINDINGS: No significant degenerative joint disease of the left knee is seen. The joint spaces are relatively well preserved. No fracture is seen. No joint effusion is noted.  IMPRESSION: Negative.   Electronically Signed   By: Ivar Drape M.D.   On: 06/13/2015 14:15    Knee-L DG 4 views:  Results for orders placed during the hospital encounter of 08/22/03  DG Knee Complete 4 Views Left   Narrative Clinical Data: Pain. Recent trauma.  LEFT KNEE, FOUR VIEWS   Mineralization normal. Joint spaces preserved. No fracture, dislocation, or bone destruction.   IMPRESSION  No evidence of acute injury.  Provider: Jennye Boroughs, Lavera Guise   Note: Imaging results reviewed.  Meds  The patient has a current medication list which includes the following prescription(s): alprazolam, amlodipine, amphetamine-dextroamphetamine, aspirin ec, atorvastatin, canagliflozin, carvedilol, citalopram, fluticasone-salmeterol, gabapentin, insulin aspart, levemir flextouch, litetouch pen needles, magnesium oxide, meloxicam, metformin, nitroglycerin, oxycodone, pantoprazole, proair hfa, sacubitril-valsartan, sure comfort lancets 30g, tiotropium, truetrack test, and vitamin d (ergocalciferol).  Current Outpatient Prescriptions on File Prior to Visit  Medication Sig  . alprazolam (XANAX) 2 MG tablet Take 1 tablet  by mouth 3 (three) times daily as needed.   Marland Kitchen amLODipine (NORVASC) 10 MG tablet Take 10 mg by mouth daily.  Marland Kitchen amphetamine-dextroamphetamine (ADDERALL) 20 MG tablet Take 20 mg by mouth 3 (three) times daily.   Marland Kitchen aspirin EC 81 MG tablet Take 1 tablet (81 mg total) by mouth daily.  Marland Kitchen atorvastatin (LIPITOR) 80 MG tablet Take 1 tablet by mouth daily.  . Canagliflozin (INVOKANA) 100 MG TABS Take 100 mg by mouth daily.  . carvedilol (COREG) 25 MG tablet Take 25 mg by mouth 2 (two) times daily with a meal.   . citalopram (CELEXA) 20 MG tablet Take 1 tablet by mouth daily.  . Fluticasone-Salmeterol (ADVAIR DISKUS) 500-50 MCG/DOSE AEPB Inhale 1 puff into the lungs every 12 (twelve) hours.    . gabapentin (NEURONTIN) 600 MG tablet Take 600 mg by mouth 3 (three) times daily.  . insulin aspart (NOVOLOG FLEXPEN) 100 UNIT/ML FlexPen Inject 0-26 Units into the skin 3 (three) times daily with meals. 20 units three times a day with sliding scale max of 26 units  . LEVEMIR FLEXTOUCH 100 UNIT/ML Pen INJECT 20 UNITS EVERY MORNING.  . LITETOUCH PEN NEEDLES 29G X 12.7MM MISC   . Magnesium Oxide 500 MG CAPS Take 1 capsule (500 mg total) by mouth 2 (two) times daily at 8 am and 10 pm.  . meloxicam (MOBIC) 15 MG tablet Take 15 mg by mouth 2 (two) times daily.  . metFORMIN (GLUCOPHAGE) 1000 MG tablet Take 1,000 mg by mouth 2 (two) times daily with a meal.  . nitroGLYCERIN (NITROSTAT) 0.4 MG SL tablet Place 1 tablet (0.4 mg total) under the tongue every 5 (five) minutes as needed for chest pain.  . pantoprazole (PROTONIX) 40 MG tablet TAKE ONE TABLET BY MOUTH TWICE DAILY  . PROAIR HFA 108 (90 BASE) MCG/ACT inhaler Inhale 2 puffs into the lungs every 4 (four) hours as needed.  . sacubitril-valsartan (ENTRESTO) 97-103 MG Take 1 tablet by mouth 2 (two) times daily.  . SURE COMFORT LANCETS 30G MISC USE TO CHECK SUGAR THREE TIMES DAILY {AS DIRECTED]  . tiotropium (SPIRIVA) 18 MCG inhalation capsule Place 18 mcg into inhaler and  inhale daily.    Angelia Mould TEST test strip USE TO  CHECK BLOOD SUGAR THREE TIMES DAILY  . Vitamin D, Ergocalciferol, (DRISDOL) 50000 units CAPS capsule Take 1 capsule (50,000 Units total) by mouth 2 (two) times a week. X 6 weeks.   No current facility-administered medications on file prior to visit.    ROS  Constitutional: Denies any fever or chills Gastrointestinal: No reported hemesis, hematochezia, vomiting, or acute GI distress Musculoskeletal: Denies any acute onset joint swelling, redness, loss of ROM, or weakness Neurological: No reported episodes of acute onset apraxia, aphasia, dysarthria, agnosia, amnesia, paralysis, loss of coordination, or loss of consciousness  Allergies  Mr. Mehringer is allergic to tylenol; lodine; and propoxyphene n-acetaminophen.  [place]  Medical:  Mr. Reczek  has a past medical history of Anxiety; Pulmonary nodule; Hyperlipidemia; COPD (chronic obstructive pulmonary disease) ([place]); Chronic back pain; Obesity; Diabetes mellitus, type 2 ([place]); Hypertension; Cellulitis (2011); GERD (gastroesophageal reflux disease); Claustrophobia; Diverticulitis of colon (2008); and Heart attack ([place]). Family: family history includes Cancer in his mother; Coronary artery disease in his mother and sister; Diabetes in his sister; Heart attack in his brother and father. There is no history of Colon cancer. Surgical:  has past surgical history that includes Appendectomy (2007); Tonsillectomy; Coronary artery bypass graft (N/A, 02/06/2013); and left heart catheterization with coronary angiogram (N/A, 02/03/2013). Tobacco:  reports that he quit smoking about 26 years ago. His smoking use included Cigarettes. He started smoking about 48 years ago. He has a 28.5 pack-year smoking history. He has never used smokeless tobacco. Alcohol:  reports that he does not drink alcohol. Drug:  reports that he uses illicit drugs (Marijuana).  Constitutional Exam  Vitals: Blood pressure 156/72, pulse  82, temperature 98 F (36.7 C), temperature source Oral, resp. rate 16, height 5\' 9"  (1.753 m), weight 242 lb (109.77 kg), SpO2 97 %. General appearance: Well nourished, well developed, and well hydrated. In no acute distress Calculated BMI/Body habitus: Body mass index is 35.72 kg/(m^2). (35-39.9 kg/m2) Severe obesity (Class II) - 136% higher incidence of chronic pain Psych/Mental status: Alert and oriented x 3 (person, place, & time) Eyes: PERLA Respiratory: No evidence of acute respiratory distress  Cervical Spine Exam  Inspection: No masses, redness, or swelling Alignment: Symmetrical ROM: Functional: ROM is within functional limits [facility]) Stability: No instability detected Muscle strength & Tone: Functionally intact Sensory: Unimpaired Palpation: No complaints of tenderness  Upper Extremity (UE) Exam    Side: Right upper extremity  Side: Left upper extremity  Inspection: No masses, redness, swelling, or asymmetry  Inspection: No masses, redness, swelling, or asymmetry  ROM:  ROM:  Functional: ROM is within functional limits [facility])        Functional: ROM is within functional limits [facility])        Muscle strength & Tone: Functionally intact  Muscle strength & Tone: Functionally intact  Sensory: Unimpaired  Sensory: Unimpaired  Palpation: No complaints of tenderness  Palpation: No complaints of tenderness   Thoracic Spine Exam  Inspection: No masses, redness, or swelling Alignment: Symmetrical ROM: Functional: ROM is within functional limits [facility]) Stability: No instability detected Sensory: Unimpaired Muscle strength & Tone: Functionally intact Palpation: No complaints of tenderness  Lumbar Spine Exam  Inspection: No masses, redness, or swelling Alignment: Symmetrical ROM: Functional: ROM is within functional limits [facility]) Stability: No instability detected Muscle strength & Tone: Functionally intact Sensory: Unimpaired Palpation: No complaints of tenderness Provocative  Tests: Lumbar Hyperextension and rotation test: evaluation deferred today       Patrick's Maneuver: evaluation deferred today  Gait & Posture Assessment  Ambulation: Unassisted Gait: Unaffected Posture: WNL   Lower Extremity Exam    Side: Right lower extremity  Side: Left lower extremity  Inspection: No masses, redness, swelling, or asymmetry ROM:  Inspection: No masses, redness, swelling, or asymmetry ROM:  Functional: ROM is within functional limits [facility])        Functional: ROM is within functional limits [facility])        Muscle strength & Tone: Functionally intact  Muscle strength & Tone: Functionally intact  Sensory: Unimpaired  Sensory: Unimpaired  Palpation: No complaints of tenderness  Palpation: No complaints of tenderness   Assessment & Plan  Primary Diagnosis & Pertinent Problem List: The primary encounter diagnosis was Chronic pain. Diagnoses of Long term current use of opiate analgesic, Long term prescription opiate use, Opiate use (90 MME/Day), Chronic knee pain, unspecified laterality, Chronic low back pain (Location of Secondary source of pain) (Bilateral) (L>R), Chronic pain of lower extremity, unspecified laterality, and Chronic lumbar radicular pain (L5 dermatome) (Location of Tertiary source of pain) (Left) were also pertinent to this visit.  Visit Diagnosis: 1. Chronic pain   2. Long term current use of opiate analgesic   3. Long term prescription opiate use   4. Opiate use (90 MME/Day)   5. Chronic knee pain, unspecified laterality   6. Chronic low back pain (Location of Secondary source of pain) (Bilateral) (L>R)   7. Chronic pain of lower extremity, unspecified laterality   8. Chronic lumbar radicular pain (L5 dermatome) (Location of Tertiary source of pain) (Left)     Problems updated and reviewed during this visit: Problem  Long Term Current Use of Opiate Analgesic  Long Term Prescription Opiate Use  Opiate use (90 MME/Day)   At the time of his  initial evaluation at the [facility] pain clinic on 06/13/2015 the patient indicated not taking any medications. However evaluation of the [place] PMP has shown that the patient used opioids on a regular basis since before 2011. The highest documented those in the PMP [facility] to be oxycodone IR 15 mg every 6 hours (60 mg/day of oxycodone). Background evaluation of prior UDS testing shows the patient to have been positive for THC on 3 different locations (11/23/2006, 11/21/2008, and 05/22/2011).     Problem-specific Plan(s): No problem-specific assessment & plan notes found for this encounter.  No new assessment & plan notes have been filed under this hospital service since the last note was generated. Service: Pain Management   Plan of Care   Problem List Items Addressed This Visit      High   Chronic knee pain (Location of Primary Source of Pain) (Bilateral) (L>R) (Chronic)   Chronic low back pain (Location of Secondary source of pain) (Bilateral) (L>R) (Chronic)   Relevant Medications   oxyCODONE (ROXICODONE) 15 MG immediate release tablet   Chronic lower extremity pain (Location of Tertiary source of pain) (Bilateral) (L>R) (Chronic)   Chronic lumbar radicular pain (L5 dermatome) (Location of Tertiary source of pain) (Left) (Chronic)   Chronic pain - Primary (Chronic)   Relevant Medications   oxyCODONE (ROXICODONE) 15 MG immediate release tablet     Medium   Long term current use of opiate analgesic (Chronic)   Relevant Orders   ToxASSURE Select 13 (MW), Urine   Long term prescription opiate use (Chronic)   Opiate use (90 MME/Day) (Chronic)       Pharmacotherapy (Medications Ordered): Meds ordered this encounter  Medications  . oxyCODONE (ROXICODONE)  15 MG immediate release tablet    Sig: Take 1 tablet (15 mg total) by mouth every 6 (six) hours as needed for pain.    Dispense:  120 tablet    Refill:  0    Do not place this medication, or any other  prescription from our practice, on "Automatic Refill". Patient may have prescription filled one day early if pharmacy is closed on scheduled refill date. Do not fill until: 08/06/15 To last until: 09/05/15    [facility] & Procedure Ordered: Orders Placed This Encounter  Procedures  . ToxASSURE Select 13 (MW), Urine    Imaging Ordered: None  Interventional Therapies: Scheduled: Left L4-5 LESI, w/o sedation   Considering:   Diagnostic bilateral intra-articular knee injection, without fluoroscopic guidance or IV sedation.   Diagnostic bilateral genicular nerve block under fluoroscopic guidance, with a without sedation.   Possible bilateral genicular nerve radiofrequency ablation under fluoroscopic guidance and IV sedation, the pending on the results of the diagnostic injection.   Diagnostic bilateral lumbar facet block under fluoroscopic guidance and IV sedation.   Possible bilateral lumbar facet radiofrequency ablation under fluoroscopic guidance and IV sedation depending on the results of the diagnostic injection.   Diagnostic left-sided L4-5 lumbar epidural steroid injection under fluoroscopic guidance, with or without IV sedation.   Diagnostic bilateral sacroiliac joint block under fluoroscopic guidance, with or without sedation.   Possible bilateral sacroiliac joint radiofrequency ablation under fluoroscopic guidance and IV sedation, the pending on the results of the diagnostic injection.    PRN Procedures:   Diagnostic bilateral intra-articular knee injection, without fluoroscopic guidance or IV sedation.   Diagnostic bilateral genicular nerve block under fluoroscopic guidance, with a without sedation.   Diagnostic bilateral lumbar facet block under fluoroscopic guidance and IV sedation.   Diagnostic left-sided L4-5 lumbar epidural steroid injection under fluoroscopic guidance, with or without IV sedation.   Diagnostic bilateral sacroiliac joint  block under fluoroscopic guidance, with or without sedation.        Referral(s) or Consult(s): None at this time.  New Prescriptions   No medications on file    Medications administered during this visit: Mr. Fradette had no medications administered during this visit.  Requested PM Follow-up: Return in about 4 weeks (around 08/28/2015) for Med-Mgmt, (1-Mo).  Future Appointments Date Time Provider [place]  08/06/2015 1:30 PM Milinda Pointer, MD [facility] None  08/27/2015 9:20 AM Milinda Pointer, MD [facility] None    Primary Care Physician: [facility] Location: [facility] Outpatient Pain Management Facility Note by: Kathlen Brunswick. Dossie Arbour, M.D, DABA, DABAPM, DABPM, DABIPP, FIPP  Pain Score Disclaimer: We use the NRS-11 scale. This is a self-reported, subjective measurement of pain severity with only modest accuracy. It is used primarily to identify changes within a particular patient. It must be understood that outpatient pain scales are significantly less accurate that those used for research, where they can be applied under ideal controlled circumstances with minimal exposure to variables. In reality, the score is likely to be a combination of pain intensity and pain affect, where pain affect describes the degree of emotional arousal or changes in action readiness caused by the sensory experience of pain. Factors such as social and work situation, setting, emotional state, anxiety levels, expectation, and prior pain experience may influence pain perception and show large inter-individual differences that may also be affected by time variables.  Patient instructions provided during this appointment: Patient Instructions   Epidural Steroid Injection An epidural steroid injection is given  to relieve pain in your neck, back, or legs that is caused by the irritation or swelling of a nerve root. This procedure involves injecting a steroid and numbing medicine  (anesthetic) into the epidural space. The epidural space is the space between the outer covering of your spinal cord and the bones that form your backbone (vertebra).  LET [facility] CARE PROVIDER KNOW ABOUT:   Any allergies you have.  All medicines you are taking, including vitamins, herbs, eye drops, creams, and over-the-counter medicines such as aspirin.  Previous problems you or members of your family have had with the use of anesthetics.  Any blood disorders or blood clotting disorders you have.  Previous surgeries you have had.  Medical conditions you have. RISKS AND COMPLICATIONS Generally, this is a safe procedure. However, as with any procedure, complications can occur. Possible complications of epidural steroid injection include:  Headache.  Bleeding.  Infection.  Allergic reaction to the medicines.  Damage to your nerves. The response to this procedure depends on the underlying cause of the pain and its duration. People who have long-term (chronic) pain are less likely to benefit from epidural steroids than are those people whose pain comes on strong and suddenly. BEFORE THE PROCEDURE   Ask your health care provider about changing or stopping your regular medicines. You may be advised to stop taking blood-thinning medicines a few days before the procedure.  You may be given medicines to reduce anxiety.  Arrange for someone to take you home after the procedure. PROCEDURE   You will remain awake during the procedure. You may receive medicine to make you relaxed.  You will be asked to lie on your stomach.  The injection site will be cleaned.  The injection site will be numbed with a medicine (local anesthetic).  A needle will be injected through your skin into the epidural space.  Your health care provider will use an X-ray machine to ensure that the steroid is delivered closest to the affected nerve. You may have minimal discomfort at this time.  Once the  needle is in the right position, the local anesthetic and the steroid will be injected into the epidural space.  The needle will then be removed and a bandage will be applied to the injection site. AFTER THE PROCEDURE   You may be monitored for a short time before you go home.  You may feel weakness or numbness in your arm or leg, which disappears within hours.  You may be allowed to eat, drink, and take your regular medicine.  You may have soreness at the site of the injection.   This information is not intended to replace advice given to you by your health care provider. Make sure you discuss any questions you have with your health care provider.   Document Released: 04/07/2007 Document Revised: 08/31/2012 Document Reviewed: 06/17/2012 Elsevier Interactive Patient Education 2016 [place]  What are the risk, side effects and possible complications? Generally speaking, most procedures are safe.  However, with any procedure there are risks, side effects, and the possibility of complications.  The risks and complications are dependent upon the sites that are lesioned, or the type of nerve block to be performed.  The closer the procedure is to the spine, the more serious the risks are.  Great care is taken when placing the radio frequency needles, block needles or lesioning probes, but sometimes complications can occur. Infection: Any time there is an injection through the skin,  there is a risk of infection.  This is why sterile conditions are used for these blocks.  There are four possible types of infection. Localized skin infection. Central Nervous System Infection-This can be in the form of Meningitis, which can be deadly. Epidural Infections-This can be in the form of an epidural abscess, which can cause pressure inside of the spine, causing compression of the spinal cord with subsequent paralysis. This would require an emergency surgery to decompress,  and there are no guarantees that the patient would recover from the paralysis. Discitis-This is an infection of the intervertebral discs.  It occurs in about 1% of discography procedures.  It is difficult to treat and it may lead to surgery.        2. Pain: the needles have to go through skin and soft tissues, will cause soreness.       3. Damage to internal structures:  The nerves to be lesioned may be near blood vessels or    other nerves which can be potentially damaged.       4. Bleeding: Bleeding is more common if the patient is taking blood thinners such as  aspirin, Coumadin, Ticiid, Plavix, etc., or if he/she have some genetic predisposition  such as hemophilia. Bleeding into the spinal canal can cause compression of the spinal  cord with subsequent paralysis.  This would require an emergency surgery to  decompress and there are no guarantees that the patient would recover from the  paralysis.       5. Pneumothorax:  Puncturing of a lung is a possibility, every time a needle is introduced in  the area of the chest or upper back.  Pneumothorax refers to free air around the  collapsed lung(s), inside of the thoracic cavity (chest cavity).  Another two possible  complications related to a similar event would include: Hemothorax and Chylothorax.   These are variations of the Pneumothorax, where instead of air around the collapsed  lung(s), you may have blood or chyle, respectively.       6. Spinal headaches: They may occur with any procedures in the area of the spine.       7. Persistent CSF (Cerebro-Spinal Fluid) leakage: This is a rare problem, but may occur  with prolonged intrathecal or epidural catheters either due to the formation of a fistulous  track or a dural tear.       8. Nerve damage: By working so close to the spinal cord, there is always a possibility of  nerve damage, which could be as serious as a permanent spinal cord injury with  paralysis.       9. Death:  Although rare, severe  deadly allergic reactions known as "Anaphylactic  reaction" can occur to any of the medications used.      10. Worsening of the symptoms:  We can always make thing worse.  What are the chances of something like this happening? Chances of any of this occuring are extremely low.  By statistics, you have more of a chance of getting killed in a motor vehicle accident: while driving to the hospital than any of the above occurring .  Nevertheless, you should be aware that they are possibilities.  In general, it is similar to taking a shower.  Everybody knows that you can slip, hit your head and get killed.  Does that mean that you should not shower again?  Nevertheless always keep in mind that statistics do not mean anything if you happen to  be on the wrong side of them.  Even if a procedure has a 1 (one) in a 1,000,000 (million) chance of going wrong, it you happen to be that one..Also, keep in mind that by statistics, you have more of a chance of having something go wrong when taking medications.  Who should not have this procedure? If you are on a blood thinning medication (e.g. Coumadin, Plavix, see list of "Blood Thinners"), or if you have an active infection going on, you should not have the procedure.  If you are taking any blood thinners, please inform your physician.  How should I prepare for this procedure? Do not eat or drink anything at least six hours prior to the procedure. Bring a driver with you .  It cannot be a taxi. Come accompanied by an adult that can drive you back, and that is strong enough to help you if your legs get weak or numb from the local anesthetic. Take all of your medicines the morning of the procedure with just enough water to swallow them. If you have diabetes, make sure that you are scheduled to have your procedure done first thing in the morning, whenever possible. If you have diabetes, take only half of your insulin dose and notify our nurse that you have done so as  soon as you arrive at the clinic. If you are diabetic, but only take blood sugar pills (oral hypoglycemic), then do not take them on the morning of your procedure.  You may take them after you have had the procedure. Do not take aspirin or any aspirin-containing medications, at least eleven (11) days prior to the procedure.  They may prolong bleeding. Wear loose fitting clothing that may be easy to take off and that you would not mind if it got stained with Betadine or blood. Do not wear any jewelry or perfume Remove any nail coloring.  It will interfere with some of our monitoring equipment.  NOTE: Remember that this is not meant to be interpreted as a complete list of all possible complications.  Unforeseen problems may occur.  BLOOD THINNERS The following drugs contain aspirin or other products, which can cause increased bleeding during surgery and should not be taken for 2 weeks prior to and 1 week after surgery.  If you should need take something for relief of minor pain, you may take acetaminophen which is found in Tylenol,m Datril, Anacin-3 and Panadol. It is not blood thinner. The products listed below are.  Do not take any of the products listed below in addition to any listed on your instruction sheet.  A.P.C or A.P.C with Codeine Codeine Phosphate Capsules #3 Ibuprofen Ridaura  ABC compound Congesprin Imuran rimadil  Advil Cope Indocin Robaxisal  Alka-Seltzer Effervescent Pain Reliever and Antacid Coricidin or Coricidin-D  Indomethacin Rufen  Alka-Seltzer plus Cold Medicine Cosprin Ketoprofen S-A-C Tablets  Anacin Analgesic Tablets or Capsules Coumadin Korlgesic Salflex  Anacin Extra Strength Analgesic tablets or capsules CP-2 Tablets Lanoril Salicylate  Anaprox Cuprimine Capsules Levenox Salocol  Anexsia-D Dalteparin Magan Salsalate  Anodynos Darvon compound Magnesium Salicylate Sine-off  Ansaid Dasin Capsules Magsal Sodium Salicylate  Anturane Depen Capsules Marnal Soma  APF  Arthritis pain formula Dewitt's Pills Measurin Stanback  Argesic Dia-Gesic Meclofenamic Sulfinpyrazone  Arthritis Bayer Timed Release Aspirin Diclofenac Meclomen Sulindac  Arthritis pain formula Anacin Dicumarol Medipren Supac  Analgesic (Safety coated) Arthralgen Diffunasal Mefanamic Suprofen  Arthritis Strength Bufferin Dihydrocodeine Mepro Compound Suprol  Arthropan liquid Dopirydamole Methcarbomol with Aspirin Synalgos  ASA  tablets/Enseals Disalcid Micrainin Tagament  Ascriptin Doan's Midol Talwin  Ascriptin A/D Dolene Mobidin Tanderil  Ascriptin Extra Strength Dolobid Moblgesic Ticlid  Ascriptin with Codeine Doloprin or Doloprin with Codeine Momentum Tolectin  Asperbuf Duoprin Mono-gesic Trendar  Aspergum Duradyne Motrin or Motrin IB Triminicin  Aspirin plain, buffered or enteric coated Durasal Myochrisine Trigesic  Aspirin Suppositories Easprin Nalfon Trillsate  Aspirin with Codeine Ecotrin Regular or Extra Strength Naprosyn Uracel  Atromid-S Efficin Naproxen Ursinus  Auranofin Capsules Elmiron Neocylate Vanquish  Axotal Emagrin Norgesic Verin  Azathioprine Empirin or Empirin with Codeine Normiflo Vitamin E  Azolid Emprazil Nuprin Voltaren  Bayer Aspirin plain, buffered or children's or timed BC Tablets or powders Encaprin Orgaran Warfarin Sodium  Buff-a-Comp Enoxaparin Orudis Zorpin  Buff-a-Comp with Codeine Equegesic Os-Cal-Gesic   Buffaprin Excedrin plain, buffered or Extra Strength Oxalid   Bufferin Arthritis Strength Feldene Oxphenbutazone   Bufferin plain or Extra Strength Feldene Capsules Oxycodone with Aspirin   Bufferin with Codeine Fenoprofen Fenoprofen Pabalate or Pabalate-SF   Buffets II Flogesic Panagesic   Buffinol plain or Extra Strength Florinal or Florinal with Codeine Panwarfarin   Buf-Tabs Flurbiprofen Penicillamine   Butalbital Compound Four-way cold tablets Penicillin   Butazolidin Fragmin Pepto-Bismol   Carbenicillin Geminisyn Percodan   Carna Arthritis  Reliever Geopen Persantine   Carprofen Gold's salt Persistin   Chloramphenicol Goody's Phenylbutazone   Chloromycetin Haltrain Piroxlcam   Clmetidine heparin Plaquenil   Cllnoril Hyco-pap Ponstel   Clofibrate Hydroxy chloroquine Propoxyphen         Before stopping any of these medications, be sure to consult the physician who ordered them.  Some, such as Coumadin (Warfarin) are ordered to prevent or treat serious conditions such as "deep thrombosis", "pumonary embolisms", and other heart problems.  The amount of time that you may need off of the medication may also vary with the medication and the reason for which you were taking it.  If you are taking any of these medications, please make sure you notify your pain physician before you undergo any procedures.

## 2015-08-02 NOTE — Progress Notes (Signed)
Patient here for medication management and post procedure visit. Oxycodone HCL 15 mg tablets last fill 07/08/2015 16/120 Safety precautions to be maintained throughout the outpatient stay will include: orient to surroundings, keep bed in low position, maintain call bell within reach at all times, provide assistance with transfer out of bed and ambulation.

## 2015-08-02 NOTE — Patient Instructions (Addendum)
Epidural Steroid Injection An epidural steroid injection is given to relieve pain in your neck, back, or legs that is caused by the irritation or swelling of a nerve root. This procedure involves injecting a steroid and numbing medicine (anesthetic) into the epidural space. The epidural space is the space between the outer covering of your spinal cord and the bones that form your backbone (vertebra).  LET YOUR HEALTH CARE PROVIDER KNOW ABOUT:   Any allergies you have.  All medicines you are taking, including vitamins, herbs, eye drops, creams, and over-the-counter medicines such as aspirin.  Previous problems you or members of your family have had with the use of anesthetics.  Any blood disorders or blood clotting disorders you have.  Previous surgeries you have had.  Medical conditions you have. RISKS AND COMPLICATIONS Generally, this is a safe procedure. However, as with any procedure, complications can occur. Possible complications of epidural steroid injection include:  Headache.  Bleeding.  Infection.  Allergic reaction to the medicines.  Damage to your nerves. The response to this procedure depends on the underlying cause of the pain and its duration. People who have long-term (chronic) pain are less likely to benefit from epidural steroids than are those people whose pain comes on strong and suddenly. BEFORE THE PROCEDURE   Ask your health care provider about changing or stopping your regular medicines. You may be advised to stop taking blood-thinning medicines a few days before the procedure.  You may be given medicines to reduce anxiety.  Arrange for someone to take you home after the procedure. PROCEDURE   You will remain awake during the procedure. You may receive medicine to make you relaxed.  You will be asked to lie on your stomach.  The injection site will be cleaned.  The injection site will be numbed with a medicine (local anesthetic).  A needle will be  injected through your skin into the epidural space.  Your health care provider will use an X-ray machine to ensure that the steroid is delivered closest to the affected nerve. You may have minimal discomfort at this time.  Once the needle is in the right position, the local anesthetic and the steroid will be injected into the epidural space.  The needle will then be removed and a bandage will be applied to the injection site. AFTER THE PROCEDURE   You may be monitored for a short time before you go home.  You may feel weakness or numbness in your arm or leg, which disappears within hours.  You may be allowed to eat, drink, and take your regular medicine.  You may have soreness at the site of the injection.   This information is not intended to replace advice given to you by your health care provider. Make sure you discuss any questions you have with your health care provider.   Document Released: 04/07/2007 Document Revised: 08/31/2012 Document Reviewed: 06/17/2012 [employer] Interactive Patient Education 2016 [employer] GENERAL RISKS AND COMPLICATIONS  What are the risk, side effects and possible complications? Generally speaking, most procedures are safe.  However, with any procedure there are risks, side effects, and the possibility of complications.  The risks and complications are dependent upon the sites that are lesioned, or the type of nerve block to be performed.  The closer the procedure is to the spine, the more serious the risks are.  Great care is taken when placing the radio frequency needles, block needles or lesioning probes, but sometimes complications can occur. Infection: Any   time there is an injection through the skin, there is a risk of infection.  This is why sterile conditions are used for these blocks.  There are four possible types of infection. Localized skin infection. Central Nervous System Infection-This can be in the form of Meningitis, which can be  deadly. Epidural Infections-This can be in the form of an epidural abscess, which can cause pressure inside of the spine, causing compression of the spinal cord with subsequent paralysis. This would require an emergency surgery to decompress, and there are no guarantees that the patient would recover from the paralysis. Discitis-This is an infection of the intervertebral discs.  It occurs in about 1% of discography procedures.  It is difficult to treat and it may lead to surgery.        2. Pain: the needles have to go through skin and soft tissues, will cause soreness.       3. Damage to internal structures:  The nerves to be lesioned may be near blood vessels or    other nerves which can be potentially damaged.       4. Bleeding: Bleeding is more common if the patient is taking blood thinners such as  aspirin, Coumadin, Ticiid, Plavix, etc., or if he/she have some genetic predisposition  such as hemophilia. Bleeding into the spinal canal can cause compression of the spinal  cord with subsequent paralysis.  This would require an emergency surgery to  decompress and there are no guarantees that the patient would recover from the  paralysis.       5. Pneumothorax:  Puncturing of a lung is a possibility, every time a needle is introduced in  the area of the chest or upper back.  Pneumothorax refers to free air around the  collapsed lung(s), inside of the thoracic cavity (chest cavity).  Another two possible  complications related to a similar event would include: Hemothorax and Chylothorax.   These are variations of the Pneumothorax, where instead of air around the collapsed  lung(s), you may have blood or chyle, respectively.       6. Spinal headaches: They may occur with any procedures in the area of the spine.       7. Persistent CSF (Cerebro-Spinal Fluid) leakage: This is a rare problem, but may occur  with prolonged intrathecal or epidural catheters either due to the formation of a fistulous  track or a  dural tear.       8. Nerve damage: By working so close to the spinal cord, there is always a possibility of  nerve damage, which could be as serious as a permanent spinal cord injury with  paralysis.       9. Death:  Although rare, severe deadly allergic reactions known as "Anaphylactic  reaction" can occur to any of the medications used.      10. Worsening of the symptoms:  We can always make thing worse.  What are the chances of something like this happening? Chances of any of this occuring are extremely low.  By statistics, you have more of a chance of getting killed in a motor vehicle accident: while driving to the hospital than any of the above occurring .  Nevertheless, you should be aware that they are possibilities.  In general, it is similar to taking a shower.  Everybody knows that you can slip, hit your head and get killed.  Does that mean that you should not shower again?  Nevertheless always keep in mind that statistics   do not mean anything if you happen to be on the wrong side of them.  Even if a procedure has a 1 (one) in a 1,000,000 (million) chance of going wrong, it you happen to be that one..Also, keep in mind that by statistics, you have more of a chance of having something go wrong when taking medications.  Who should not have this procedure? If you are on a blood thinning medication (e.g. Coumadin, Plavix, see list of "Blood Thinners"), or if you have an active infection going on, you should not have the procedure.  If you are taking any blood thinners, please inform your physician.  How should I prepare for this procedure? Do not eat or drink anything at least six hours prior to the procedure. Bring a driver with you .  It cannot be a taxi. Come accompanied by an adult that can drive you back, and that is strong enough to help you if your legs get weak or numb from the local anesthetic. Take all of your medicines the morning of the procedure with just enough water to swallow  them. If you have diabetes, make sure that you are scheduled to have your procedure done first thing in the morning, whenever possible. If you have diabetes, take only half of your insulin dose and notify our nurse that you have done so as soon as you arrive at the clinic. If you are diabetic, but only take blood sugar pills (oral hypoglycemic), then do not take them on the morning of your procedure.  You may take them after you have had the procedure. Do not take aspirin or any aspirin-containing medications, at least eleven (11) days prior to the procedure.  They may prolong bleeding. Wear loose fitting clothing that may be easy to take off and that you would not mind if it got stained with Betadine or blood. Do not wear any jewelry or perfume Remove any nail coloring.  It will interfere with some of our monitoring equipment.  NOTE: Remember that this is not meant to be interpreted as a complete list of all possible complications.  Unforeseen problems may occur.  BLOOD THINNERS The following drugs contain aspirin or other products, which can cause increased bleeding during surgery and should not be taken for 2 weeks prior to and 1 week after surgery.  If you should need take something for relief of minor pain, you may take acetaminophen which is found in Tylenol,m Datril, Anacin-3 and Panadol. It is not blood thinner. The products listed below are.  Do not take any of the products listed below in addition to any listed on your instruction sheet.  A.P.C or A.P.C with Codeine Codeine Phosphate Capsules #3 Ibuprofen Ridaura  ABC compound Congesprin Imuran rimadil  Advil Cope Indocin Robaxisal  Alka-Seltzer Effervescent Pain Reliever and Antacid Coricidin or Coricidin-D  Indomethacin Rufen  Alka-Seltzer plus Cold Medicine Cosprin Ketoprofen S-A-C Tablets  Anacin Analgesic Tablets or Capsules Coumadin Korlgesic Salflex  Anacin Extra Strength Analgesic tablets or capsules CP-2 Tablets Lanoril  Salicylate  Anaprox Cuprimine Capsules Levenox Salocol  Anexsia-D Dalteparin Magan Salsalate  Anodynos Darvon compound Magnesium Salicylate Sine-off  Ansaid Dasin Capsules Magsal Sodium Salicylate  Anturane Depen Capsules Marnal Soma  APF Arthritis pain formula Dewitt's Pills Measurin Stanback  Argesic Dia-Gesic Meclofenamic Sulfinpyrazone  Arthritis Bayer Timed Release Aspirin Diclofenac Meclomen Sulindac  Arthritis pain formula Anacin Dicumarol Medipren Supac  Analgesic (Safety coated) Arthralgen Diffunasal Mefanamic Suprofen  Arthritis Strength Bufferin Dihydrocodeine Mepro Compound Suprol  Arthropan   liquid Dopirydamole Methcarbomol with Aspirin Synalgos  ASA tablets/Enseals Disalcid Micrainin Tagament  Ascriptin Doan's Midol Talwin  Ascriptin A/D Dolene Mobidin Tanderil  Ascriptin Extra Strength Dolobid Moblgesic Ticlid  Ascriptin with Codeine Doloprin or Doloprin with Codeine Momentum Tolectin  Asperbuf Duoprin Mono-gesic Trendar  Aspergum Duradyne Motrin or Motrin IB Triminicin  Aspirin plain, buffered or enteric coated Durasal Myochrisine Trigesic  Aspirin Suppositories Easprin Nalfon Trillsate  Aspirin with Codeine Ecotrin Regular or Extra Strength Naprosyn Uracel  Atromid-S Efficin Naproxen Ursinus  Auranofin Capsules Elmiron Neocylate Vanquish  Axotal Emagrin Norgesic Verin  Azathioprine Empirin or Empirin with Codeine Normiflo Vitamin E  Azolid Emprazil Nuprin Voltaren  Bayer Aspirin plain, buffered or children's or timed BC Tablets or powders Encaprin Orgaran Warfarin Sodium  Buff-a-Comp Enoxaparin Orudis Zorpin  Buff-a-Comp with Codeine Equegesic Os-Cal-Gesic   Buffaprin Excedrin plain, buffered or Extra Strength Oxalid   Bufferin Arthritis Strength Feldene Oxphenbutazone   Bufferin plain or Extra Strength Feldene Capsules Oxycodone with Aspirin   Bufferin with Codeine Fenoprofen Fenoprofen Pabalate or Pabalate-SF   Buffets II Flogesic Panagesic   Buffinol plain or  Extra Strength Florinal or Florinal with Codeine Panwarfarin   Buf-Tabs Flurbiprofen Penicillamine   Butalbital Compound Four-way cold tablets Penicillin   Butazolidin Fragmin Pepto-Bismol   Carbenicillin Geminisyn Percodan   Carna Arthritis Reliever Geopen Persantine   Carprofen Gold's salt Persistin   Chloramphenicol Goody's Phenylbutazone   Chloromycetin Haltrain Piroxlcam   Clmetidine heparin Plaquenil   Cllnoril Hyco-pap Ponstel   Clofibrate Hydroxy chloroquine Propoxyphen         Before stopping any of these medications, be sure to consult the physician who ordered them.  Some, such as Coumadin (Warfarin) are ordered to prevent or treat serious conditions such as "deep thrombosis", "pumonary embolisms", and other heart problems.  The amount of time that you may need off of the medication may also vary with the medication and the reason for which you were taking it.  If you are taking any of these medications, please make sure you notify your pain physician before you undergo any procedures.          

## 2015-08-06 ENCOUNTER — Ambulatory Visit: Payer: Medicare Other | Attending: Pain Medicine | Admitting: Pain Medicine

## 2015-08-06 ENCOUNTER — Encounter: Payer: Self-pay | Admitting: Pain Medicine

## 2015-08-06 VITALS — BP 145/72 | HR 71 | Temp 98.1°F | Resp 20 | Ht 69.0 in | Wt 242.0 lb

## 2015-08-06 DIAGNOSIS — E1165 Type 2 diabetes mellitus with hyperglycemia: Secondary | ICD-10-CM | POA: Insufficient documentation

## 2015-08-06 DIAGNOSIS — M79602 Pain in left arm: Secondary | ICD-10-CM | POA: Diagnosis not present

## 2015-08-06 DIAGNOSIS — M4726 Other spondylosis with radiculopathy, lumbar region: Secondary | ICD-10-CM

## 2015-08-06 DIAGNOSIS — Z79891 Long term (current) use of opiate analgesic: Secondary | ICD-10-CM | POA: Insufficient documentation

## 2015-08-06 DIAGNOSIS — I10 Essential (primary) hypertension: Secondary | ICD-10-CM | POA: Insufficient documentation

## 2015-08-06 DIAGNOSIS — J449 Chronic obstructive pulmonary disease, unspecified: Secondary | ICD-10-CM | POA: Diagnosis not present

## 2015-08-06 DIAGNOSIS — Z951 Presence of aortocoronary bypass graft: Secondary | ICD-10-CM | POA: Diagnosis not present

## 2015-08-06 DIAGNOSIS — M170 Bilateral primary osteoarthritis of knee: Secondary | ICD-10-CM | POA: Diagnosis not present

## 2015-08-06 DIAGNOSIS — M47816 Spondylosis without myelopathy or radiculopathy, lumbar region: Secondary | ICD-10-CM | POA: Diagnosis not present

## 2015-08-06 DIAGNOSIS — F419 Anxiety disorder, unspecified: Secondary | ICD-10-CM | POA: Insufficient documentation

## 2015-08-06 DIAGNOSIS — E785 Hyperlipidemia, unspecified: Secondary | ICD-10-CM | POA: Diagnosis not present

## 2015-08-06 DIAGNOSIS — M545 Low back pain: Secondary | ICD-10-CM | POA: Diagnosis present

## 2015-08-06 DIAGNOSIS — E559 Vitamin D deficiency, unspecified: Secondary | ICD-10-CM | POA: Insufficient documentation

## 2015-08-06 DIAGNOSIS — E1365 Other specified diabetes mellitus with hyperglycemia: Secondary | ICD-10-CM | POA: Insufficient documentation

## 2015-08-06 DIAGNOSIS — G8929 Other chronic pain: Secondary | ICD-10-CM | POA: Diagnosis not present

## 2015-08-06 DIAGNOSIS — E669 Obesity, unspecified: Secondary | ICD-10-CM | POA: Insufficient documentation

## 2015-08-06 DIAGNOSIS — K219 Gastro-esophageal reflux disease without esophagitis: Secondary | ICD-10-CM | POA: Insufficient documentation

## 2015-08-06 DIAGNOSIS — F1290 Cannabis use, unspecified, uncomplicated: Secondary | ICD-10-CM | POA: Insufficient documentation

## 2015-08-06 DIAGNOSIS — R1319 Other dysphagia: Secondary | ICD-10-CM | POA: Diagnosis not present

## 2015-08-06 DIAGNOSIS — R197 Diarrhea, unspecified: Secondary | ICD-10-CM | POA: Insufficient documentation

## 2015-08-06 DIAGNOSIS — I252 Old myocardial infarction: Secondary | ICD-10-CM | POA: Diagnosis not present

## 2015-08-06 DIAGNOSIS — Z794 Long term (current) use of insulin: Secondary | ICD-10-CM | POA: Diagnosis not present

## 2015-08-06 DIAGNOSIS — R911 Solitary pulmonary nodule: Secondary | ICD-10-CM | POA: Insufficient documentation

## 2015-08-06 DIAGNOSIS — M47896 Other spondylosis, lumbar region: Secondary | ICD-10-CM | POA: Diagnosis not present

## 2015-08-06 DIAGNOSIS — M5416 Radiculopathy, lumbar region: Secondary | ICD-10-CM

## 2015-08-06 DIAGNOSIS — M533 Sacrococcygeal disorders, not elsewhere classified: Secondary | ICD-10-CM | POA: Insufficient documentation

## 2015-08-06 DIAGNOSIS — M79605 Pain in left leg: Secondary | ICD-10-CM

## 2015-08-06 DIAGNOSIS — M25552 Pain in left hip: Secondary | ICD-10-CM | POA: Diagnosis present

## 2015-08-06 MED ORDER — IOPAMIDOL (ISOVUE-M 200) INJECTION 41%: 10.0000 mL | Freq: Once | INTRAMUSCULAR | Status: DC

## 2015-08-06 MED ORDER — ROPIVACAINE HCL 2 MG/ML IJ SOLN
2.0000 mL | Freq: Once | INTRAMUSCULAR | Status: AC
  Administered 2015-08-06: 2 mL via EPIDURAL
  Filled 2015-08-06: qty 10

## 2015-08-06 MED ORDER — SODIUM CHLORIDE 0.9 % IJ SOLN
INTRAMUSCULAR | Status: AC
  Administered 2015-08-06: 15:00:00
  Filled 2015-08-06: qty 10

## 2015-08-06 MED ORDER — LIDOCAINE HCL (PF) 1 % IJ SOLN
10.0000 mL | Freq: Once | INTRAMUSCULAR | Status: AC
  Administered 2015-08-06: 10 mL
  Filled 2015-08-06: qty 5

## 2015-08-06 MED ORDER — TRIAMCINOLONE ACETONIDE 40 MG/ML IJ SUSP
40.0000 mg | Freq: Once | INTRAMUSCULAR | Status: AC
Start: 2015-08-06 — End: 2015-08-06
  Administered 2015-08-06: 40 mg
  Filled 2015-08-06: qty 1

## 2015-08-06 MED ORDER — SODIUM CHLORIDE 0.9% FLUSH
2.0000 mL | Freq: Once | INTRAVENOUS | Status: AC
Start: 2015-08-06 — End: 2015-08-06
  Administered 2015-08-06: 2 mL

## 2015-08-06 NOTE — Patient Instructions (Signed)
Epidural Steroid Injection Patient Information  Description: The epidural space surrounds the nerves as they exit the spinal cord.  In some patients, the nerves can be compressed and inflamed by a bulging disc or a tight spinal canal (spinal stenosis).  By injecting steroids into the epidural space, we can bring irritated nerves into direct contact with a potentially helpful medication.  These steroids act directly on the irritated nerves and can reduce swelling and inflammation which often leads to decreased pain.  Epidural steroids may be injected anywhere along the spine and from the neck to the low back depending upon the location of your pain.   After numbing the skin with local anesthetic (like Novocaine), a small needle is passed into the epidural space slowly.  You may experience a sensation of pressure while this is being done.  The entire block usually last less than 10 minutes.  Conditions which may be treated by epidural steroids:   Low back and leg pain  Neck and arm pain  Spinal stenosis  Post-laminectomy syndrome  Herpes zoster (shingles) pain  Pain from compression fractures  Preparation for the injection:  1. Do not eat any solid food or dairy products within 8 hours of your appointment.  2. You may drink clear liquids up to 3 hours before appointment.  Clear liquids include water, black coffee, juice or soda.  No milk or cream please. 3. You may take your regular medication, including pain medications, with a sip of water before your appointment  Diabetics should hold regular insulin (if taken separately) and take 1/2 normal NPH dos the morning of the procedure.  Carry some sugar containing items with you to your appointment. 4. A driver must accompany you and be prepared to drive you home after your procedure.  5. Bring all your current medications with your. 6. An IV may be inserted and sedation may be given at the discretion of the physician.   7. A blood pressure  cuff, EKG and other monitors will often be applied during the procedure.  Some patients may need to have extra oxygen administered for a short period. 8. You will be asked to provide medical information, including your allergies, prior to the procedure.  We must know immediately if you are taking blood thinners (like Coumadin/Warfarin)  Or if you are allergic to IV iodine contrast (dye). We must know if you could possible be pregnant.  Possible side-effects:  Bleeding from needle site  Infection (rare, may require surgery)  Nerve injury (rare)  Numbness & tingling (temporary)  Difficulty urinating (rare, temporary)  Spinal headache ( a headache worse with upright posture)  Light -headedness (temporary)  Pain at injection site (several days)  Decreased blood pressure (temporary)  Weakness in arm/leg (temporary)  Pressure sensation in back/neck (temporary)  Call if you experience:  Fever/chills associated with headache or increased back/neck pain.  Headache worsened by an upright position.  New onset weakness or numbness of an extremity below the injection site  Hives or difficulty breathing (go to the emergency room)  Inflammation or drainage at the infection site  Severe back/neck pain  Any new symptoms which are concerning to you  Please note:  Although the local anesthetic injected can often make your back or neck feel good for several hours after the injection, the pain will likely return.  It takes 3-7 days for steroids to work in the epidural space.  You may not notice any pain relief for at least that one week.    If effective, we will often do a series of three injections spaced 3-6 weeks apart to maximally decrease your pain.  After the initial series, we generally will wait several months before considering a repeat injection of the same type.  If you have any questions, please call [phone] [facility] 

## 2015-08-06 NOTE — Progress Notes (Signed)
Safety precautions to be maintained throughout the outpatient stay will include: orient to surroundings, keep bed in low position, maintain call bell within reach at all times, provide assistance with transfer out of bed and ambulation.  

## 2015-08-06 NOTE — Progress Notes (Signed)
Patient's Name: Manuel Knight  Patient type: Established  MRN: [id number]  Service setting: Ambulatory outpatient  DOB: 23-Feb-1960  Location: [facility]  DOS: 08/06/2015  Primary Care Physician: [facility]  Note by: Beatriz Chancellor A. Dossie Arbour, M.D, DABA, DABAPM, DABPM, Milagros Evener, FIPP  Referring Physician: Milinda Pointer, MD  Specialty: Board-Certified Interventional Pain Management  Last Visit to Pain Management: 08/02/2015   Primary Reason(s) for Visit: Interventional Pain Management Treatment. CC: Back Pain (low left) and Knee Pain (left)  Chronic radicular lumbar pain [M54.16, G89.29]   Procedure:  Anesthesia, Analgesia, Anxiolysis:  Type: Therapeutic Inter-Laminar Epidural Steroid Injection Region: Lumbar Level: L4-5 Level. Laterality: Left Paramedial  Indications: 1. Chronic lumbar radicular pain (L5 dermatome) (Location of Tertiary source of pain) (Left)   2. Chronic pain of lower extremity, left   3. Osteoarthritis of spine with radiculopathy, lumbar region     Pre-procedure Pain Score: 3/10 Reported level of pain is compatible with clinical observations Post-procedure Pain Score: 1   Type: Local Anesthesia Local Anesthetic: Lidocaine 1% Route: Infiltration ([place]/IM) IV Access: Declined Sedation: Declined  Indication(s): Analgesia          Pre-Procedure Assessment:  Mr. Fearn is a 55 y.o. year old, male patient, seen today for interventional treatment. He has Type 2 diabetes mellitus with hyperglycemia, with long-term current use of insulin ([place]); HLD (hyperlipidemia); Obesity, unspecified; Anxiety state; Essential hypertension; COPD; PULMONARY NODULE; HERNIA; GERD (gastroesophageal reflux disease); Esophageal dysphagia; Chronic diarrhea; Accelerated hypertension; NSTEMI (non-ST elevated myocardial infarction) ([place]); CAD (coronary artery disease), native coronary artery; S/P CABG x 3; Chronic systolic heart failure ([place]);  Emphysema of lung ([place]); Disorder of joint of spine; H/O coronary artery bypass surgery; Chronic pain; Encounter for pain management planning; Encounter for therapeutic drug level monitoring; Opiate use (90 MME/Day); Chronic low back pain (Location of Secondary source of pain) (Bilateral) (L>R); Chronic knee pain (Location of Primary Source of Pain) (Bilateral) (L>R); Chronic foot pain (Bilateral) (L>R); Disturbance of skin sensation; Diabetic peripheral neuropathy (HCC) (lower extremity); Chronic lumbar radicular pain (L5 dermatome) (Location of Tertiary source of pain) (Left); 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) (L>R); 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 His primarily concern today is the Back Pain (low left) and Knee Pain (left)   Pain Type: Chronic pain Pain Location: Back Pain Orientation: Lower Pain Descriptors / Indicators: Constant, Throbbing, Sharp, Shooting Pain Frequency: Constant  Date of Last Visit: 08/02/15 Service Provided on Last Visit: Med Refill  Coagulation Parameters Lab Results  Component Value Date   INR 1.59 (H) 02/06/2013   LABPROT 18.5 (H) 02/06/2013   APTT 40 (H) 02/06/2013   PLT 307 03/15/2015    Verification of the correct person, correct site (including marking of site), and correct procedure were performed and confirmed by the patient.  Consent: Secured. Under the influence of no sedatives a written informed consent was obtained, after having provided information on the risks and possible complications. To fulfill our ethical and legal  obligations, as recommended by the American Medical Association's Code of Ethics, we have provided information to the patient about our clinical impression; the nature and purpose of the treatment  or procedure; the risks, benefits, and possible complications of the intervention; alternatives; the risk(s) and benefit(s) of the alternative treatment(s) or procedure(s); and the risk(s) and benefit(s) of doing nothing. The patient was provided information about the risks and possible complications associated with the procedure. These include, but are not limited to, failure to achieve desired goals, infection, bleeding, organ or nerve damage, allergic reactions, paralysis, and death. In the case of spinal procedures these may include, but are not limited to, failure to achieve desired goals, infection, bleeding, organ or nerve damage, allergic reactions, paralysis, and death. In addition, the patient was informed that Medicine is not an exact science; therefore, there is also the possibility of unforeseen risks and possible complications that may result in a catastrophic outcome. The patient indicated having understood very clearly. We have given the patient no guarantees and we have made no promises. Enough time was given to the patient to ask questions, all of which were answered to the patient's satisfaction.  Consent Attestation: I, the ordering provider, attest that I have discussed with the patient the benefits, risks, side-effects, alternatives, likelihood of achieving goals, and potential problems during recovery for the procedure that I have provided informed consent.  Pre-Procedure Preparation: Safety Precautions: Allergies reviewed. Appropriate site, procedure, and patient were confirmed by following the Joint Commission's Universal Protocol (UP.01.01.01), in the form of a "Time Out". The patient was asked to confirm marked site and procedure, before commencing. The patient was asked about blood  thinners, or active infections, both of which were denied. Patient was assessed for positional comfort and all pressure points were checked before starting procedure. Allergies: He is allergic to tylenol [acetaminophen]; iodine; lodine [etodolac]; and propoxyphene n-acetaminophen.. Infection Control Precautions: Sterile technique used. Standard Universal Precautions were taken as recommended by the Department of [facility] for Disease Control and Prevention (CDC). Standard pre-surgical skin prep was conducted. Respiratory hygiene and cough etiquette was practiced. Hand hygiene observed. Safe injection practices and needle disposal techniques followed. SDV (single dose vial) medications used. Medications properly checked for expiration dates and contaminants. Personal protective equipment (PPE) used: Surgical mask. Sterile Radiation-resistant gloves. Monitoring:  As per clinic protocol. Vitals:   08/06/15 1336 08/06/15 1512 08/06/15 1516 08/06/15 1519  BP: 111/68 (!) 143/77 (!) 160/83 (!) 145/72  Pulse: 64 71 72 71  Resp: _0 Temp: 98.1 F (36.7 C)     SpO2: 98% 96% 97% 97%  Weight: 242 lb (109.8 kg)     Height: _1  (1.753 m)     Calculated BMI: Body mass index is 35.74 kg/m.  Description of Procedure Process:  Time-out: "Time-out" completed before starting procedure, as per protocol. Position: Prone Target Area: For Epidural Steroid injections, the target area is the  interlaminar space, initially targeting the lower border of the superior vertebral body lamina. Approach: Posterior approach. Area Prepped: Entire Posterior Lumbosacral Region Prepping solution: ChloraPrep (2% chlorhexidine gluconate and 70% isopropyl alcohol) Safety Precautions: Aspiration looking for blood return was conducted prior to all injections. At no point did we inject any substances, as a needle was being advanced. No attempts were made at seeking any paresthesias. Safe injection practices and  needle disposal techniques used. Medications properly checked for expiration dates. SDV (single dose vial) medications used.         Description of the Procedure: Protocol guidelines were followed. The patient was placed in position over the fluoroscopy table. The target area was identified and the area prepped in the  usual manner. Skin desensitized using vapocoolant spray. Skin & deeper tissues infiltrated with local anesthetic. Appropriate amount of time allowed to pass for local anesthetics to take effect. The procedure needle was introduced through the skin, ipsilateral to the reported pain, and advanced to the target area. Bone was contacted and the needle walked caudad, until the lamina was cleared. The epidural space was identified using "loss-of-resistance technique" with 2-3 ml of PF-NaCl (0.9% NSS), in a 5cc LOR glass syringe. Proper needle placement secured. Negative aspiration confirmed. Solution injected in intermittent fashion, asking for systemic symptoms every 0.5cc of injectate. The needles were then removed and the area cleansed, making sure to leave some of the prepping solution back to take advantage of its long term bactericidal properties. EBL: None Materials & Medications Used:  Needle(s) Used: 20g - 10cm, Tuohy-style epidural needle Medication(s): Please see chart orders for medication and dosing details.  Imaging Guidance:  Type of Imaging Technique: Fluoroscopy Guidance (Spinal) Indication(s): Assistance in needle guidance and placement for procedures requiring needle placement in or near specific anatomical locations not easily accessible without such assistance. Exposure Time: Please see nurses notes. Contrast: Before injecting any contrast, we confirmed that the patient did not have an allergy to iodine, shellfish, or radiological contrast. Once satisfactory needle placement was completed at the desired level, radiological contrast was injected. Injection was conducted under  continuous fluoroscopic guidance. Injection of contrast accomplished without complications. See chart for type and volume of contrast used. Fluoroscopic Guidance: I was personally present in the fluoroscopy suite, where the patient was placed in position for the procedure, over the fluoroscopy-compatible table. Fluoroscopy was manipulated, using "Tunnel Vision Technique", to obtain the best possible view of the target area, on the affected side. Parallax error was corrected before commencing the procedure. A "direction-depth-direction" technique was used to introduce the needle under continuous pulsed fluoroscopic guidance. Once the target was reached, antero-posterior, oblique, and lateral fluoroscopic projection views were taken to confirm needle placement in all planes. Permanently recorded images stored by scanning into EMR. Interpretation: Intraoperative imaging interpretation by performing Physician. Adequate needle placement confirmed in AP & Oblique Views. Appropriate spread of contrast to desired area. No evidence of afferent or efferent intravascular uptake. No intrathecal or subarachnoid spread observed. Permanent images scanned into the patient's record.  Antibiotic Prophylaxis:  Indication(s): No indications identified. Type:  Antibiotics Given (last 72 hours)    None       Post-operative Assessment:   Complications: No immediate post-treatment complications were observed. Relevant Post-operative Information:  Disposition: Return to clinic for follow-up evaluation. The patient tolerated the entire procedure well. A repeat set of vitals were taken after the procedure and the patient was kept under observation following institutional policy, for this procedure. Post-procedural neurological assessment was performed, showing return to baseline, prior to discharge. The patient was discharged home, once institutional criteria were met. The patient was provided with post-procedure discharge  instructions, including a section on how to identify potential problems. Should any problems arise concerning this procedure, the patient was given instructions to immediately contact us, at any time, without hesitation. In any case, we plan to contact the patient by telephone for a follow-up status report regarding this interventional procedure. Comments:  No additional relevant information.  Medications administered during this visit: We administered triamcinolone acetonide, lidocaine (PF), sodium chloride flush, ropivacaine (PF) 2 mg/ml (0.2%), and sodium chloride.  Prescriptions ordered during this visit: New Prescriptions   No medications on file    Future Appointments Date  Time Provider [place]  08/27/2015 9:20 AM Milinda Pointer, MD [facility] None    Primary Care Physician: [facility] Location: [facility] Note by: Kathlen Brunswick. Dossie Arbour, M.D, DABA, DABAPM, DABPM, DABIPP, FIPP  Disclaimer:  Medicine is not an exact science. The only guarantee in medicine is that nothing is guaranteed. It is important to note that the decision to proceed with this intervention was based on the information collected from the patient. The Data and conclusions were drawn from the patient's questionnaire, the interview, and the physical examination. Because the information was provided in large part by the patient, it cannot be guaranteed that it has not been purposely or unconsciously manipulated. Every effort has been made to obtain as much relevant data as possible for this evaluation. It is important to note that the conclusions that lead to this procedure are derived in large part from the available data. Always take into account that the treatment will also be dependent on availability of resources and existing treatment guidelines, considered by other Pain Management Practitioners as being common knowledge and practice, at the time of the  intervention. For Medico-Legal purposes, it is also important to point out that variation in procedural techniques and pharmacological choices are the acceptable norm. The indications, contraindications, technique, and results of the above procedure should only be interpreted and judged by a Board-Certified Interventional Pain Specialist with extensive familiarity and expertise in the same exact procedure and technique. Attempts at providing opinions without similar or greater experience and expertise than that of the treating physician will be considered as inappropriate and unethical, and shall result in a formal complaint to the state medical board and applicable specialty societies.

## 2015-08-27 ENCOUNTER — Ambulatory Visit: Payer: Medicare Other | Attending: Pain Medicine | Admitting: Pain Medicine

## 2015-08-27 ENCOUNTER — Encounter: Payer: Self-pay | Admitting: Pain Medicine

## 2015-08-27 VITALS — BP 123/66 | HR 77 | Temp 98.0°F | Resp 16 | Ht 69.0 in | Wt 250.0 lb

## 2015-08-27 DIAGNOSIS — R911 Solitary pulmonary nodule: Secondary | ICD-10-CM | POA: Diagnosis not present

## 2015-08-27 DIAGNOSIS — F1290 Cannabis use, unspecified, uncomplicated: Secondary | ICD-10-CM | POA: Insufficient documentation

## 2015-08-27 DIAGNOSIS — R200 Anesthesia of skin: Secondary | ICD-10-CM | POA: Diagnosis not present

## 2015-08-27 DIAGNOSIS — E559 Vitamin D deficiency, unspecified: Secondary | ICD-10-CM | POA: Insufficient documentation

## 2015-08-27 DIAGNOSIS — F1190 Opioid use, unspecified, uncomplicated: Secondary | ICD-10-CM | POA: Diagnosis not present

## 2015-08-27 DIAGNOSIS — K469 Unspecified abdominal hernia without obstruction or gangrene: Secondary | ICD-10-CM | POA: Insufficient documentation

## 2015-08-27 DIAGNOSIS — M25562 Pain in left knee: Secondary | ICD-10-CM | POA: Diagnosis present

## 2015-08-27 DIAGNOSIS — I5022 Chronic systolic (congestive) heart failure: Secondary | ICD-10-CM | POA: Insufficient documentation

## 2015-08-27 DIAGNOSIS — E1140 Type 2 diabetes mellitus with diabetic neuropathy, unspecified: Secondary | ICD-10-CM | POA: Insufficient documentation

## 2015-08-27 DIAGNOSIS — M5416 Radiculopathy, lumbar region: Secondary | ICD-10-CM | POA: Diagnosis not present

## 2015-08-27 DIAGNOSIS — Z951 Presence of aortocoronary bypass graft: Secondary | ICD-10-CM | POA: Insufficient documentation

## 2015-08-27 DIAGNOSIS — R197 Diarrhea, unspecified: Secondary | ICD-10-CM | POA: Insufficient documentation

## 2015-08-27 DIAGNOSIS — G8929 Other chronic pain: Secondary | ICD-10-CM | POA: Diagnosis not present

## 2015-08-27 DIAGNOSIS — I10 Essential (primary) hypertension: Secondary | ICD-10-CM | POA: Insufficient documentation

## 2015-08-27 DIAGNOSIS — Z794 Long term (current) use of insulin: Secondary | ICD-10-CM | POA: Diagnosis not present

## 2015-08-27 DIAGNOSIS — E669 Obesity, unspecified: Secondary | ICD-10-CM | POA: Diagnosis not present

## 2015-08-27 DIAGNOSIS — E785 Hyperlipidemia, unspecified: Secondary | ICD-10-CM | POA: Insufficient documentation

## 2015-08-27 DIAGNOSIS — Z79891 Long term (current) use of opiate analgesic: Secondary | ICD-10-CM | POA: Diagnosis not present

## 2015-08-27 DIAGNOSIS — E1165 Type 2 diabetes mellitus with hyperglycemia: Secondary | ICD-10-CM | POA: Insufficient documentation

## 2015-08-27 DIAGNOSIS — M533 Sacrococcygeal disorders, not elsewhere classified: Secondary | ICD-10-CM | POA: Insufficient documentation

## 2015-08-27 DIAGNOSIS — K219 Gastro-esophageal reflux disease without esophagitis: Secondary | ICD-10-CM | POA: Diagnosis not present

## 2015-08-27 DIAGNOSIS — F419 Anxiety disorder, unspecified: Secondary | ICD-10-CM | POA: Diagnosis not present

## 2015-08-27 DIAGNOSIS — M545 Low back pain: Secondary | ICD-10-CM | POA: Diagnosis present

## 2015-08-27 DIAGNOSIS — I252 Old myocardial infarction: Secondary | ICD-10-CM | POA: Diagnosis not present

## 2015-08-27 DIAGNOSIS — R1310 Dysphagia, unspecified: Secondary | ICD-10-CM | POA: Diagnosis not present

## 2015-08-27 DIAGNOSIS — M79606 Pain in leg, unspecified: Secondary | ICD-10-CM

## 2015-08-27 DIAGNOSIS — I2510 Atherosclerotic heart disease of native coronary artery without angina pectoris: Secondary | ICD-10-CM | POA: Insufficient documentation

## 2015-08-27 DIAGNOSIS — J439 Emphysema, unspecified: Secondary | ICD-10-CM | POA: Insufficient documentation

## 2015-08-27 DIAGNOSIS — J449 Chronic obstructive pulmonary disease, unspecified: Secondary | ICD-10-CM | POA: Diagnosis not present

## 2015-08-27 DIAGNOSIS — M170 Bilateral primary osteoarthritis of knee: Secondary | ICD-10-CM | POA: Insufficient documentation

## 2015-08-27 MED ORDER — OXYCODONE HCL 15 MG PO TABS: 15.0000 mg | ORAL_TABLET | Freq: Four times a day (QID) | ORAL | 0 refills | Status: DC | PRN

## 2015-08-27 NOTE — Patient Instructions (Signed)
You were given a prescription for Oxycodone today. Epidural Steroid Injection Patient Information  Description: The epidural space surrounds the nerves as they exit the spinal cord.  In some patients, the nerves can be compressed and inflamed by a bulging disc or a tight spinal canal (spinal stenosis).  By injecting steroids into the epidural space, we can bring irritated nerves into direct contact with a potentially helpful medication.  These steroids act directly on the irritated nerves and can reduce swelling and inflammation which often leads to decreased pain.  Epidural steroids may be injected anywhere along the spine and from the neck to the low back depending upon the location of your pain.   After numbing the skin with local anesthetic (like Novocaine), a small needle is passed into the epidural space slowly.  You may experience a sensation of pressure while this is being done.  The entire block usually last less than 10 minutes.  Conditions which may be treated by epidural steroids:   Low back and leg pain  Neck and arm pain  Spinal stenosis  Post-laminectomy syndrome  Herpes zoster (shingles) pain  Pain from compression fractures  Preparation for the injection:  1. Do not eat any solid food or dairy products within 8 hours of your appointment.  2. You may drink clear liquids up to 3 hours before appointment.  Clear liquids include water, black coffee, juice or soda.  No milk or cream please. 3. You may take your regular medication, including pain medications, with a sip of water before your appointment  Diabetics should hold regular insulin (if taken separately) and take 1/2 normal NPH dos the morning of the procedure.  Carry some sugar containing items with you to your appointment. 4. A driver must accompany you and be prepared to drive you home after your procedure.  5. Bring all your current medications with your. 6. An IV may be inserted and sedation may be given at the  discretion of the physician.   7. A blood pressure cuff, EKG and other monitors will often be applied during the procedure.  Some patients may need to have extra oxygen administered for a short period. 8. You will be asked to provide medical information, including your allergies, prior to the procedure.  We must know immediately if you are taking blood thinners (like Coumadin/Warfarin)  Or if you are allergic to IV iodine contrast (dye). We must know if you could possible be pregnant.  Possible side-effects:  Bleeding from needle site  Infection (rare, may require surgery)  Nerve injury (rare)  Numbness & tingling (temporary)  Difficulty urinating (rare, temporary)  Spinal headache ( a headache worse with upright posture)  Light -headedness (temporary)  Pain at injection site (several days)  Decreased blood pressure (temporary)  Weakness in arm/leg (temporary)  Pressure sensation in back/neck (temporary)  Call if you experience:  Fever/chills associated with headache or increased back/neck pain.  Headache worsened by an upright position.  New onset weakness or numbness of an extremity below the injection site  Hives or difficulty breathing (go to the emergency room)  Inflammation or drainage at the infection site  Severe back/neck pain  Any new symptoms which are concerning to you  Please note:  Although the local anesthetic injected can often make your back or neck feel good for several hours after the injection, the pain will likely return.  It takes 3-7 days for steroids to work in the epidural space.  You may not notice any pain  relief for at least that one week.  If effective, we will often do a series of three injections spaced 3-6 weeks apart to maximally decrease your pain.  After the initial series, we generally will wait several months before considering a repeat injection of the same type.  If you have any questions, please call [phone] [facility]

## 2015-08-27 NOTE — Progress Notes (Signed)
Patient's Name: Manuel Knight  Patient type: Established  MRN: [id number]  Service setting: Ambulatory outpatient  DOB: [birth year]  Location: [facility]  DOS: 08/27/2015  Primary Care Physician: [facility]  Note by: Beatriz Chancellor A. Dossie Arbour, M.D, DABA, DABAPM, DABPM, DABIPP, FIPP  Referring Physician: [facility]*  Specialty: Board-Certified Interventional Pain Management  Last Visit to Pain Management: 08/06/2015   Primary Reason(s) for Visit: Encounter for prescription drug management & post-procedure evaluation of chronic illness with mild to moderate exacerbation(Level of risk: moderate) CC: Back Pain (lower) and Knee Pain (left)   HPI  Manuel Knight is a 55 y.o. year old, male patient, who returns today as an established patient. He has Type 2 diabetes mellitus with hyperglycemia, with long-term current use of insulin ([place]); HLD (hyperlipidemia); Obesity, unspecified; Anxiety state; Essential hypertension; COPD; PULMONARY NODULE; HERNIA; GERD (gastroesophageal reflux disease); Esophageal dysphagia; Chronic diarrhea; Accelerated hypertension; NSTEMI (non-ST elevated myocardial infarction) ([place]); CAD (coronary artery disease), native coronary artery; S/P CABG x 3; Chronic systolic heart failure ([place]); Emphysema of lung ([place]); Disorder of joint of spine; H/O coronary artery bypass surgery; Chronic pain; Encounter for pain management planning; Encounter for therapeutic drug level monitoring; Opiate use (90 MME/Day); Chronic low back pain (Location of Secondary source of pain) (Bilateral) (L>R); Chronic knee pain (Location of Primary Source of Pain) (Bilateral) (L>R); Chronic foot pain (Bilateral) (L>R); Disturbance of skin sensation; Diabetic peripheral neuropathy (HCC) (lower extremity); Chronic lumbar radicular pain (L5 dermatome) (Location of Tertiary source of pain) (Left); Chronic sacroiliac joint pain (Location of Secondary source of  pain) (Bilateral) (L>R); 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 His primarily concern today is the Back Pain (lower) and Knee Pain (left)   Pain Assessment: Self-Reported Pain Score: 4              Reported level is compatible with observation       Pain Type: Chronic pain Pain Location: Back Pain Orientation: Lower Pain Descriptors / Indicators: Constant Pain Frequency: Constant  The patient comes into the clinics today for post-procedure evaluation on the interventional treatment done on 08/06/2015. In addition, he comes in today for pharmacological management of his chronic pain.  The patient  reports that he uses drugs, including Marijuana.  Date of Last Visit: 08/06/15 Service Provided on Last Visit: Procedure  Controlled Substance Pharmacotherapy Assessment & REMS (Risk Evaluation and Mitigation Strategy)  Analgesic: Oxycodone IR 15 mg every 6 hours (60 mg/day) MME/day: 90 mg/day. Pill Count: Oxycodone 15 mg #35 out of  Remaining. Filled 08-06-15. Pharmacokinetics: Onset of action (Liberation/Absorption): Within expected pharmacological parameters Time to Peak effect (Distribution): Timing and results are as within normal expected parameters Duration of action (Metabolism/Excretion): Within normal limits for medication Pharmacodynamics: Analgesic Effect: More than 50% Activity Facilitation: Medication(s) allow patient to sit, stand, walk, and do the basic ADLs Perceived Effectiveness: Described as relatively effective, allowing for increase in activities of daily living (ADL) Side-effects or Adverse  reactions: None reported Monitoring: [place] PMP: Online review of the past 76-month period conducted. Compliant with practice rules and regulations Last UDS on record: No results found for: TOXASSSELUR UDS interpretation: Compliant  Medication Assessment Form: Reviewed. Patient indicates being compliant with therapy Treatment compliance: Compliant Risk Assessment: Aberrant Behavior: None observed today Substance Use Disorder (SUD) Risk Level: High Risk of opioid abuse or dependence: 0.7-3.0% with doses ? 36 MME/day and 6.1-26% with doses ? 120 MME/day. Opioid Risk Tool (ORT) Score:  8 High Risk for Opioid Abuse (Score >8) Depression Scale Score: PHQ-2: PHQ-2 Total Score: 0 No depression (0) PHQ-9: PHQ-9 Total Score: 0 No depression (0-4)  Pharmacologic Plan: No change in therapy, at this time  Post-Procedure Assessment  Procedure done on last visit: Left L4-5 LESI under fluoro, no sedation Side-effects or Adverse reactions: None reported Sedation: Sedation given  Results: Ultra-Short Term Relief (First 1 hour after procedure): 40 % 100% Analgesia during this period is likely to be Local Anesthetic and/or IV Sedative (Analgesic/Anxiolitic) related Short Term Relief (Initial 4-6 hrs after procedure): 40 % 100% Complete relief confirms area to be the source of pain Long Term Relief : 0 % >50% Long-term benefit would suggest an inflammatory etiology to the pain   Current Relief (Now): 50% x 8 days Persistent relief would suggest effective anti-inflammatory effects from steroids Interpretation of Results: We'll repeat to see if it will prolong the duration of benefit.  Laboratory Chemistry  Inflammation Markers Lab Results  Component Value Date   ESRSEDRATE 2 06/13/2015   CRP 0.8 06/13/2015    Renal Function Lab Results  Component Value Date   BUN 15 06/13/2015   CREATININE 0.98 06/13/2015   GFRAA >60 06/13/2015   GFRNONAA >60 06/13/2015    Hepatic Function Lab  Results  Component Value Date   AST 17 06/13/2015   ALT 19 06/13/2015   ALBUMIN 4.0 06/13/2015    Electrolytes Lab Results  Component Value Date   NA 138 06/13/2015   K 4.6 06/13/2015   CL 107 06/13/2015   CALCIUM 9.4 06/13/2015   MG 1.4 (L) 06/13/2015    Pain Modulating Vitamins Lab Results  Component Value Date   VD25OH 31 02/03/2010   25OHVITD1 25 (L) 06/13/2015   25OHVITD2 <1.0 06/13/2015   25OHVITD3 25 06/13/2015   VITAMINB12 1,002 (H) 06/13/2015    Coagulation Parameters Lab Results  Component Value Date   INR 1.59 (H) 02/06/2013   LABPROT 18.5 (H) 02/06/2013   APTT 40 (H) 02/06/2013   PLT 307 03/15/2015    Cardiovascular Lab Results  Component Value Date   HGB 14.5 03/15/2015   HCT 45.5 03/15/2015    Note: Lab results reviewed.  Recent Diagnostic Imaging  Mr Lumbar Spine Wo Contrast  Result Date: 06/21/2015 CLINICAL DATA:  Low back pain radiating to both knees for approximately 10 years. No known injury. EXAM: MRI LUMBAR SPINE WITHOUT CONTRAST TECHNIQUE: Multiplanar, multisequence MR imaging of the lumbar spine was performed. No intravenous contrast was administered. COMPARISON:  MRI lumbar spine 06/17/2010. FINDINGS: Segmentation:  Unremarkable. Alignment:  Normal. Vertebrae: No fracture or marrow signal abnormality. Small Schmorl's nodes in the superior endplates of L1, L2 and L3 are unchanged. Conus medullaris: Extends to the T12-L1 level and appears normal. Paraspinal and other soft tissues: Unremarkable. Disc levels: T12-L1 is imaged in the sagittal plane only and negative. L1-2: Negative. L2-3: Negative. L3-4: Negative. L4-5: Mild facet degenerative change and a minimal disc bulge without central canal or foraminal stenosis. The appearance is stable. L5-S1: Shallow, broad-based central and eccentric to the left protrusion without central canal or foraminal stenosis. The appearance is unchanged. IMPRESSION: No change in mild lower lumbar degenerative disease  without central canal or foraminal narrowing. Electronically Signed   By: Inge Rise M.D.   On: 06/21/2015 10:10    Meds  The patient has a current medication list which includes the following prescription(s): albuterol, alprazolam, amlodipine, amphetamine-dextroamphetamine, aspirin ec, atorvastatin, benazepril, canagliflozin, carvedilol, citalopram, clonidine, duloxetine, fluticasone-salmeterol, gabapentin, insulin aspart, insulin detemir, insulin pen needle, insulin pen needle, insulin syringe-needle u-100, insulin syringe-needle u-100, litetouch pen needles, magnesium oxide, meloxicam, metformin, mupirocin ointment, naloxone hcl, nitroglycerin, oxycodone, pantoprazole, proair hfa, sacubitril-valsartan, sure comfort lancets 30g, tiotropium, and vitamin d (ergocalciferol).  Current Outpatient Prescriptions on File Prior to Visit  Medication Sig  . albuterol (PROAIR HFA) 108 (90 Base) MCG/ACT inhaler Inhale into the lungs.  Marland Kitchen alprazolam (XANAX) 2 MG tablet Take 1 tablet by mouth 3 (three) times daily as needed.   Marland Kitchen amLODipine (NORVASC) 10 MG tablet Take 10 mg by mouth daily.  Marland Kitchen amphetamine-dextroamphetamine (ADDERALL) 20 MG tablet Take 20 mg by mouth 3 (three) times daily.   Marland Kitchen aspirin EC 81 MG tablet Take 1 tablet (81 mg total) by mouth daily.  Marland Kitchen atorvastatin (LIPITOR) 80 MG tablet Take 1 tablet by mouth daily.  . benazepril (LOTENSIN) 20 MG tablet Take by mouth.  . Canagliflozin (INVOKANA) 100 MG TABS Take 100 mg by mouth daily.  . carvedilol (COREG) 25 MG tablet Take 25 mg by mouth 2 (two) times daily with a meal.   . citalopram (CELEXA) 20 MG tablet Take 1 tablet by mouth daily.  . cloNIDine (CATAPRES) 0.3 MG tablet Take 0.3 mg by mouth 2 (two) times daily.  . DULoxetine (CYMBALTA) 30 MG capsule 1 tab daily for one day, then 2 tabs daily  . Fluticasone-Salmeterol (ADVAIR DISKUS) 500-50 MCG/DOSE AEPB Inhale 1 puff into the lungs every 12 (twelve) hours.    . gabapentin (NEURONTIN) 600 MG  tablet Take 600 mg by mouth 3 (three) times daily.  . insulin aspart (NOVOLOG FLEXPEN) 100 UNIT/ML FlexPen Inject 0-26 Units into the skin 3 (three) times daily with meals. 20 units three times a day with sliding scale max of 26 units  . insulin detemir (LEVEMIR) 100 UNIT/ML injection Inject into the skin.  . Insulin Pen Needle (LITETOUCH PEN NEEDLES) 29G X 12.7MM MISC Use as directed.  . Insulin Pen Needle (PRODIGY INSULIN PEN NEEDLES) 29G X 13MM MISC Use as directed.  . Insulin Syringe-Needle U-100 (B-D INS SYR ULTRAFINE 1CC/31G) 31G X 5/16" 1 ML MISC   . Insulin Syringe-Needle U-100 (B-D INS SYRINGE 0.5CC/31GX5/16) 31G X 5/16" 0.5 ML MISC   . LITETOUCH PEN NEEDLES 29G X 12.7MM MISC   . Magnesium Oxide 500 MG CAPS Take 1 capsule (500 mg total) by mouth 2 (two) times daily at 8 am and 10 pm.  . meloxicam (MOBIC) 15 MG tablet Take 15 mg by mouth 2 (two) times daily.  . metFORMIN (GLUCOPHAGE) 1000 MG tablet Take 1,000 mg by mouth 2 (two) times daily with a meal.  . mupirocin ointment (BACTROBAN) 2 % Apply topically.  . naloxone HCl 4 MG/0.1ML LIQD One spray in either nostril once for known/suspected opioid overdose. May repeat every 2-3 minutes in alternating nostril til EMS arrives  . nitroGLYCERIN (NITROSTAT) 0.4 MG SL tablet Place 1 tablet (0.4 mg total) under the tongue every 5 (five) minutes as needed for chest pain.  . pantoprazole (PROTONIX) 40 MG tablet 1 tablet Two (2) times a day.  Marland Kitchen PROAIR HFA 108 (90 BASE) MCG/ACT inhaler Inhale 2 puffs into the lungs every 4 (four) hours  as needed.  . sacubitril-valsartan (ENTRESTO) 97-103 MG Take 1 tablet by mouth 2 (two) times daily.  . SURE COMFORT LANCETS 30G MISC USE TO CHECK SUGAR THREE TIMES DAILY {AS DIRECTED]  . tiotropium (SPIRIVA) 18 MCG inhalation capsule Place 18 mcg into inhaler and inhale daily.    . Vitamin D, Ergocalciferol, (DRISDOL) 50000 units CAPS capsule Take 1 capsule (50,000 Units total) by mouth 2 (two) times a week. X 6 weeks.    No current facility-administered medications on file prior to visit.     ROS  Constitutional: Denies any fever or chills Gastrointestinal: No reported hemesis, hematochezia, vomiting, or acute GI distress Musculoskeletal: Denies any acute onset joint swelling, redness, loss of ROM, or weakness Neurological: No reported episodes of acute onset apraxia, aphasia, dysarthria, agnosia, amnesia, paralysis, loss of coordination, or loss of consciousness  Allergies  Mr. Docken is allergic to tylenol [acetaminophen]; iodine; lodine [etodolac]; and propoxyphene n-acetaminophen.  [place]  Medical:  Mr. Kallstrom  has a past medical history of Anxiety; Cellulitis (2011); Chronic back pain; Claustrophobia; COPD (chronic obstructive pulmonary disease) ([place]); Diabetes mellitus, type 2 ([place]); Diverticulitis of colon (2008); GERD (gastroesophageal reflux disease); Heart attack ([place]); Hyperlipidemia; Hypertension; Obesity; and Pulmonary nodule. Family: family history includes Cancer in his mother; Coronary artery disease in his mother and sister; Diabetes in his sister; Heart attack in his brother and father. Surgical:  has a past surgical history that includes Appendectomy (2007); Tonsillectomy; Coronary artery bypass graft (N/A, 02/06/2013); and left heart catheterization with coronary angiogram (N/A, 02/03/2013). Tobacco:  reports that he quit smoking about 26 years ago. His smoking use included Cigarettes. He started smoking about 48 years ago. He has a 28.50 pack-year smoking history. He has never used smokeless tobacco. Alcohol:  reports that he does not drink alcohol. Drug:  reports that he uses drugs, including Marijuana.  Constitutional Exam  Vitals: Blood pressure 123/66, pulse 77, temperature 98 F (36.7 C), temperature source Oral, resp. rate 16, height 5\' 9"  (1.753 m), weight 250 lb (113.4 kg), SpO2 94 %. General appearance: Well nourished, well developed, and well hydrated. In no acute  distress Calculated BMI/Body habitus: Body mass index is 36.92 kg/m. (35-39.9 kg/m2) Severe obesity (Class II) - 136% higher incidence of chronic pain Psych/Mental status: Alert and oriented x 3 (person, place, & time) Eyes: PERLA Respiratory: No evidence of acute respiratory distress  Cervical Spine Exam  Inspection: No masses, redness, or swelling Alignment: Symmetrical Functional ROM: ROM appears unrestricted Stability: No instability detected Muscle strength & Tone: Functionally intact Sensory: Unimpaired Palpation: Non-contributory  Upper Extremity (UE) Exam    Side: Right upper extremity  Side: Left upper extremity  Inspection: No masses, redness, swelling, or asymmetry  Inspection: No masses, redness, swelling, or asymmetry  Functional ROM: ROM appears unrestricted  Functional ROM: ROM appears unrestricted  Muscle strength & Tone: Functionally intact  Muscle strength & Tone: Functionally intact  Sensory: Unimpaired  Sensory: Unimpaired  Palpation: Non-contributory  Palpation: Non-contributory   Thoracic Spine Exam  Inspection: No masses, redness, or swelling Alignment: Symmetrical Functional ROM: ROM appears unrestricted Stability: No instability detected Sensory: Unimpaired Muscle strength & Tone: Functionally intact Palpation: Non-contributory  Lumbar Spine Exam  Inspection: No masses, redness, or swelling Alignment: Symmetrical Functional ROM: ROM appears unrestricted Stability: No instability detected Muscle strength & Tone: Functionally intact Sensory: Unimpaired Palpation: Non-contributory Provocative Tests: Lumbar Hyperextension and rotation test: evaluation deferred today       Patrick's Maneuver: evaluation deferred today  Gait & Posture Assessment  Ambulation: Unassisted Gait: Relatively normal for age and body habitus Posture: WNL   Lower Extremity Exam    Side: Right lower extremity  Side: Left lower extremity  Inspection: No masses,  redness, swelling, or asymmetry  Inspection: No masses, redness, swelling, or asymmetry  Functional ROM: ROM appears unrestricted  Functional ROM: ROM appears unrestricted  Muscle strength & Tone: Functionally intact  Muscle strength & Tone: Functionally intact  Sensory: Unimpaired  Sensory: Unimpaired  Palpation: Non-contributory  Palpation: Non-contributory    Assessment & Plan  Primary Diagnosis & Pertinent Problem List: The primary encounter diagnosis was Chronic lumbar radicular pain (L5 dermatome) (Location of Tertiary source of pain) (Left). Diagnoses of Chronic pain of lower extremity, unspecified laterality, Chronic pain, Opiate use (90 MME/Day), and Long term current use of opiate analgesic were also pertinent to this visit.  Visit Diagnosis: 1. Chronic lumbar radicular pain (L5 dermatome) (Location of Tertiary source of pain) (Left)   2. Chronic pain of lower extremity, unspecified laterality   3. Chronic pain   4. Opiate use (90 MME/Day)   5. Long term current use of opiate analgesic     Problems updated and reviewed during this visit: No problems updated.  Problem-specific Plan(s): No problem-specific Assessment & Plan notes found for this encounter.  No new Assessment & Plan notes have been filed under this hospital service since the last note was generated. Service: Pain Management   Plan of Care   Problem List Items Addressed This Visit      High   Chronic lower extremity pain (Location of Tertiary source of pain) (Bilateral) (L>R) (Chronic)   Chronic lumbar radicular pain (L5 dermatome) (Location of Tertiary source of pain) (Left) - Primary (Chronic)   Relevant Orders   Lumbar Epidural Injection   Chronic pain (Chronic)   Relevant Medications   oxyCODONE (ROXICODONE) 15 MG immediate release tablet     Medium   Long term current use of opiate analgesic (Chronic)   Relevant Orders   ToxASSURE Select 13 (MW), Urine   Opiate use (90 MME/Day) (Chronic)     Other Visit Diagnoses   None.      Pharmacotherapy (Medications Ordered): Meds ordered this encounter  Medications  . oxyCODONE (ROXICODONE) 15 MG immediate release tablet    Sig: Take 1 tablet (15 mg total) by mouth every 6 (six) hours as needed for pain.    Dispense:  120 tablet    Refill:  0    Do not place this medication, or any other prescription from our practice, on "Automatic Refill". Patient may have prescription filled one day early if pharmacy is closed on scheduled refill date. Do not fill until: 09/05/15 To last until: 10/05/15    [facility] & Procedure Ordered: Orders Placed This Encounter  Procedures  . Lumbar Epidural Injection  . ToxASSURE Select 13 (MW), Urine    Imaging Ordered: None  Interventional Therapies: Scheduled: Left L4-5 LESI, w/o sedation #2   Considering: Diagnostic bilateral intra-articular knee injection, without fluoroscopic guidance or IV sedation.  Diagnostic bilateral genicular nerve block under fluoroscopic guidance, with a without sedation.  Possible bilateral genicular nerve radiofrequency ablation under fluoroscopic guidance and IV sedation, the pending on the results of the diagnostic injection.  Diagnostic bilateral lumbar facet block under fluoroscopic guidance and IV sedation.  Possible bilateral lumbar facet radiofrequency ablation under fluoroscopic guidance and IV sedation depending on the results of the diagnostic injection.  Diagnostic left-sided L4-5 lumbar epidural steroid injection  under fluoroscopic guidance, with or without IV sedation.  Diagnostic bilateral sacroiliac joint block under fluoroscopic guidance, with or without sedation.  Possible bilateral sacroiliac joint radiofrequency ablation under fluoroscopic guidance and IV sedation, the pending on the results of the diagnostic injection.    PRN Procedures: Diagnostic bilateral intra-articular knee injection, without fluoroscopic guidance or IV sedation.   Diagnostic bilateral genicular nerve block under fluoroscopic guidance, with a without sedation.  Diagnostic bilateral lumbar facet block under fluoroscopic guidance and IV sedation.  Diagnostic left-sided L4-5 lumbar epidural steroid injection under fluoroscopic guidance, with or without IV sedation.  Diagnostic bilateral sacroiliac joint block under fluoroscopic guidance, with or without sedation.    Referral(s) or Consult(s): None at this time.  New Prescriptions   No medications on file    Medications administered during this visit: Mr. Cackowski had no medications administered during this visit.  Requested PM Follow-up: Return in 5 weeks (on 10/02/2015) for Med-Mgmt, In addition, Schedule Procedure, (ASAP).  No future appointments.  Primary Care Physician: [facility] Location: [facility] Note by: Kathlen Brunswick. Dossie Arbour, M.D, DABA, DABAPM, DABPM, DABIPP, FIPP  Pain Score Disclaimer: We use the NRS-11 scale. This is a self-reported, subjective measurement of pain severity with only modest accuracy. It is used primarily to identify changes within a particular patient. It must be understood that outpatient pain scales are significantly less accurate that those used for research, where they can be applied under ideal controlled circumstances with minimal exposure to variables. In reality, the score is likely to be a combination of pain intensity and pain affect, where pain affect describes the degree of emotional arousal or changes in action readiness caused by the sensory experience of pain. Factors such as social and work situation, setting, emotional state, anxiety levels, expectation, and prior pain experience may influence pain perception and show large inter-individual differences that may also be affected by time variables.  Patient instructions provided at this appointment:: There are no Patient Instructions on file for this  visit.

## 2015-08-27 NOTE — Progress Notes (Signed)
Safety precautions to be maintained throughout the outpatient stay will include: orient to surroundings, keep bed in low position, maintain call bell within reach at all times, provide assistance with transfer out of bed and ambulation.   Oxycodone 15 mg #35 out of  Remaining. Filled 08-06-15.

## 2015-09-03 ENCOUNTER — Other Ambulatory Visit: Payer: Self-pay | Admitting: Cardiovascular Disease

## 2015-09-04 LAB — TOXASSURE SELECT 13 (MW), URINE: PDF: 0

## 2015-09-10 ENCOUNTER — Encounter: Payer: Self-pay | Admitting: Pain Medicine

## 2015-09-10 ENCOUNTER — Ambulatory Visit: Payer: Medicare Other | Attending: Pain Medicine | Admitting: Pain Medicine

## 2015-09-10 VITALS — BP 179/89 | HR 80 | Temp 97.8°F | Resp 14 | Ht 69.0 in | Wt 243.0 lb

## 2015-09-10 DIAGNOSIS — M79605 Pain in left leg: Secondary | ICD-10-CM | POA: Diagnosis not present

## 2015-09-10 DIAGNOSIS — M79602 Pain in left arm: Secondary | ICD-10-CM

## 2015-09-10 DIAGNOSIS — R1310 Dysphagia, unspecified: Secondary | ICD-10-CM | POA: Insufficient documentation

## 2015-09-10 DIAGNOSIS — E669 Obesity, unspecified: Secondary | ICD-10-CM | POA: Diagnosis not present

## 2015-09-10 DIAGNOSIS — I10 Essential (primary) hypertension: Secondary | ICD-10-CM | POA: Insufficient documentation

## 2015-09-10 DIAGNOSIS — G8929 Other chronic pain: Secondary | ICD-10-CM | POA: Diagnosis not present

## 2015-09-10 DIAGNOSIS — J449 Chronic obstructive pulmonary disease, unspecified: Secondary | ICD-10-CM | POA: Insufficient documentation

## 2015-09-10 DIAGNOSIS — I2510 Atherosclerotic heart disease of native coronary artery without angina pectoris: Secondary | ICD-10-CM | POA: Insufficient documentation

## 2015-09-10 DIAGNOSIS — R197 Diarrhea, unspecified: Secondary | ICD-10-CM | POA: Diagnosis not present

## 2015-09-10 DIAGNOSIS — E785 Hyperlipidemia, unspecified: Secondary | ICD-10-CM | POA: Insufficient documentation

## 2015-09-10 DIAGNOSIS — K469 Unspecified abdominal hernia without obstruction or gangrene: Secondary | ICD-10-CM | POA: Diagnosis not present

## 2015-09-10 DIAGNOSIS — K219 Gastro-esophageal reflux disease without esophagitis: Secondary | ICD-10-CM | POA: Diagnosis not present

## 2015-09-10 DIAGNOSIS — Z951 Presence of aortocoronary bypass graft: Secondary | ICD-10-CM | POA: Insufficient documentation

## 2015-09-10 DIAGNOSIS — M47816 Spondylosis without myelopathy or radiculopathy, lumbar region: Secondary | ICD-10-CM | POA: Diagnosis not present

## 2015-09-10 DIAGNOSIS — M545 Low back pain: Secondary | ICD-10-CM | POA: Diagnosis present

## 2015-09-10 DIAGNOSIS — E1140 Type 2 diabetes mellitus with diabetic neuropathy, unspecified: Secondary | ICD-10-CM | POA: Diagnosis not present

## 2015-09-10 DIAGNOSIS — M5416 Radiculopathy, lumbar region: Secondary | ICD-10-CM

## 2015-09-10 DIAGNOSIS — I5022 Chronic systolic (congestive) heart failure: Secondary | ICD-10-CM | POA: Diagnosis not present

## 2015-09-10 DIAGNOSIS — Z87898 Personal history of other specified conditions: Secondary | ICD-10-CM | POA: Insufficient documentation

## 2015-09-10 DIAGNOSIS — M170 Bilateral primary osteoarthritis of knee: Secondary | ICD-10-CM | POA: Insufficient documentation

## 2015-09-10 DIAGNOSIS — E1365 Other specified diabetes mellitus with hyperglycemia: Secondary | ICD-10-CM | POA: Diagnosis not present

## 2015-09-10 DIAGNOSIS — F419 Anxiety disorder, unspecified: Secondary | ICD-10-CM | POA: Diagnosis not present

## 2015-09-10 DIAGNOSIS — R911 Solitary pulmonary nodule: Secondary | ICD-10-CM | POA: Diagnosis not present

## 2015-09-10 DIAGNOSIS — I252 Old myocardial infarction: Secondary | ICD-10-CM | POA: Diagnosis not present

## 2015-09-10 DIAGNOSIS — Z794 Long term (current) use of insulin: Secondary | ICD-10-CM | POA: Diagnosis not present

## 2015-09-10 DIAGNOSIS — E559 Vitamin D deficiency, unspecified: Secondary | ICD-10-CM | POA: Diagnosis not present

## 2015-09-10 DIAGNOSIS — R200 Anesthesia of skin: Secondary | ICD-10-CM | POA: Diagnosis not present

## 2015-09-10 MED ORDER — ROPIVACAINE HCL 2 MG/ML IJ SOLN
2.0000 mL | Freq: Once | INTRAMUSCULAR | Status: AC
  Administered 2015-09-10: 2 mL via EPIDURAL
  Filled 2015-09-10: qty 10

## 2015-09-10 MED ORDER — TRIAMCINOLONE ACETONIDE 40 MG/ML IJ SUSP
40.0000 mg | Freq: Once | INTRAMUSCULAR | Status: AC
  Administered 2015-09-10: 40 mg
  Filled 2015-09-10: qty 1

## 2015-09-10 MED ORDER — LIDOCAINE HCL (PF) 1 % IJ SOLN
10.0000 mL | Freq: Once | INTRAMUSCULAR | Status: AC
  Administered 2015-09-10: 10 mL
  Filled 2015-09-10: qty 10

## 2015-09-10 MED ORDER — SODIUM CHLORIDE 0.9% FLUSH
2.0000 mL | Freq: Once | INTRAVENOUS | Status: AC
  Administered 2015-09-10: 2 mL

## 2015-09-10 NOTE — Progress Notes (Signed)
Safety precautions to be maintained throughout the outpatient stay will include: orient to surroundings, keep bed in low position, maintain call bell within reach at all times, provide assistance with transfer out of bed and ambulation.  

## 2015-09-10 NOTE — Progress Notes (Signed)
Patient's Name: Manuel Knight  Patient type: Established  MRN: [id number]  Service setting: Ambulatory outpatient  DOB: 01/05/1961  Location: [facility]  DOS: 09/10/2015  Primary Care Physician: [facility]  Note by: Beatriz Chancellor A. Dossie Arbour, MD  Referring Physician: Milinda Pointer, MD  Specialty: Interventional Pain Management  Last Visit to Pain Management: 08/27/2015   Primary Reason(s) for Visit: Interventional Pain Management Treatment. CC: Back Pain (low)  Chronic radicular lumbar pain [M54.16, G89.29]   Procedure:  Anesthesia, Analgesia, Anxiolysis:  Type: Therapeutic Inter-Laminar Epidural Steroid Injection #2 Region: Lumbar Level: L4-5 Level. Laterality: Left-Sided Paramedial  Type: Local Anesthesia Local Anesthetic: Lidocaine 1% Route: Infiltration ([place]/IM) IV Access: Declined Sedation: Declined  Indication(s): Analgesia          Indications: 1. Chronic lumbar radicular pain (L5 dermatome) (Location of Tertiary source of pain) (Left)   2. Chronic pain of lower extremity, left     Pre-procedure Pain Score: 3/10 Reported level of pain is compatible with clinical observations Post-procedure Pain Score: 3   Pre-Procedure Assessment:  Manuel Knight is a 55 y.o. year old, male patient, seen today for interventional treatment. He has Type 2 diabetes mellitus with hyperglycemia, with long-term current use of insulin ([place]); HLD (hyperlipidemia); Obesity, unspecified; Anxiety state; Essential hypertension; COPD; PULMONARY NODULE; HERNIA; GERD (gastroesophageal reflux disease); Esophageal dysphagia; Chronic diarrhea; Accelerated hypertension; NSTEMI (non-ST elevated myocardial infarction) ([place]); CAD (coronary artery disease), native coronary artery; S/P CABG x 3; Chronic systolic heart failure ([place]); Emphysema of lung ([place]); Disorder of joint of spine; H/O coronary artery bypass surgery; Chronic pain; Encounter for pain management  planning; Encounter for therapeutic drug level monitoring; Opiate use (90 MME/Day); Chronic low back pain (Location of Secondary source of pain) (Bilateral) (L>R); Chronic knee pain (Location of Primary Source of Pain) (Bilateral) (L>R); Chronic foot pain (Bilateral) (L>R); Disturbance of skin sensation; Diabetic peripheral neuropathy (HCC) (lower extremity); Chronic lumbar radicular pain (L5 dermatome) (Location of Tertiary source of pain) (Left); 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) (L>R); 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 His primarily concern today is the Back Pain (low)   Pain Type: Chronic pain Pain Location: Back Pain Orientation: Lower Pain Descriptors / Indicators: Constant, Aching, Sharp, Stabbing, Shooting Pain Frequency: Constant  Date of Last Visit: 08/27/15 Service Provided on Last Visit: Med Refill  Coagulation Parameters Lab Results  Component Value Date   INR 1.59 (H) 02/06/2013   LABPROT 18.5 (H) 02/06/2013   APTT 40 (H) 02/06/2013   PLT 307 03/15/2015    Verification of the correct person, correct site (including marking of site), and correct procedure were performed and confirmed by the patient.  Consent: Secured. Under the influence of no sedatives a written informed consent was obtained, after having provided information on the risks and possible complications. To fulfill our ethical and legal obligations, as recommended by the American Medical Association's Code of Ethics, we have provided information to the patient about our clinical  impression; the nature and purpose of the treatment or procedure; the risks, benefits, and possible complications of the intervention; alternatives; the risk(s) and benefit(s) of the alternative treatment(s) or procedure(s); and the  risk(s) and benefit(s) of doing nothing. The patient was provided information about the risks and possible complications associated with the procedure. These include, but are not limited to, failure to achieve desired goals, infection, bleeding, organ or nerve damage, allergic reactions, paralysis, and death. In the case of spinal procedures these may include, but are not limited to, failure to achieve desired goals, infection, bleeding, organ or nerve damage, allergic reactions, paralysis, and death. In addition, the patient was informed that Medicine is not an exact science; therefore, there is also the possibility of unforeseen risks and possible complications that may result in a catastrophic outcome. The patient indicated having understood very clearly. We have given the patient no guarantees and we have made no promises. Enough time was given to the patient to ask questions, all of which were answered to the patient's satisfaction.  Consent Attestation: I, the ordering provider, attest that I have discussed with the patient the benefits, risks, side-effects, alternatives, likelihood of achieving goals, and potential problems during recovery for the procedure that I have provided informed consent.  Pre-Procedure Preparation: Safety Precautions: Allergies reviewed. Appropriate site, procedure, and patient were confirmed by following the Joint Commission's Universal Protocol (UP.01.01.01), in the form of a "Time Out". The patient was asked to confirm marked site and procedure, before commencing. The patient was asked about blood thinners, or active infections, both of which were denied. Patient was assessed for positional comfort and all pressure points were checked before  starting procedure. Allergies: He is allergic to lodine [etodolac]; tylenol [acetaminophen]; iodine; and propoxyphene n-acetaminophen.. Infection Control Precautions: Sterile technique used. Standard Universal Precautions were taken as recommended by the Department of [facility] for Disease Control and Prevention (CDC). Standard pre-surgical skin prep was conducted. Respiratory hygiene and cough etiquette was practiced. Hand hygiene observed. Safe injection practices and needle disposal techniques followed. SDV (single dose vial) medications used. Medications properly checked for expiration dates and contaminants. Personal protective equipment (PPE) used: Surgical mask. Sterile Radiation-resistant gloves. Monitoring:  As per clinic protocol. Vitals:   09/10/15 1417 09/10/15 1425 09/10/15 1429 09/10/15 1431  BP: (!) 143/90 (!) 160/83 (!) 167/104 (!) 179/89  Pulse:      Resp: (!) '21 16 12 14  ' Temp:      SpO2: 95% 95% 95% 95%  Weight:      Height:      Calculated BMI: Body mass index is 35.88 kg/m.  Description of Procedure Process:  Time-out: "Time-out" completed before starting procedure, as per protocol. Position: Prone Target Area: For Epidural Steroid injections, the target area is the  interlaminar space, initially targeting the lower border of the superior vertebral body lamina. Approach: Posterior approach. Area Prepped: Entire Posterior Lumbosacral Region Prepping solution: ChloraPrep (2% chlorhexidine gluconate and 70% isopropyl alcohol) Safety Precautions: Aspiration looking for blood return was conducted prior to all injections. At no point did we inject any substances, as a needle was being advanced. No attempts were made at seeking any paresthesias. Safe injection practices and needle disposal techniques used. Medications properly checked for expiration dates. SDV (single dose vial) medications used. Contrast Allergy precautions taken.         Description of the  Procedure: Protocol guidelines were followed. The patient was placed in position over the fluoroscopy table. The target area was identified and the area prepped in the usual manner. Skin desensitized using vapocoolant spray. Skin & deeper tissues infiltrated with local anesthetic. Appropriate amount of time allowed to pass for local anesthetics to take  effect. The procedure needle was introduced through the skin, ipsilateral to the reported pain, and advanced to the target area. Bone was contacted and the needle walked caudad, until the lamina was cleared. The epidural space was identified using "loss-of-resistance technique" with 2-3 ml of PF-NaCl (0.9% NSS), in a 5cc LOR glass syringe. Proper needle placement secured. Negative aspiration confirmed. Solution injected in intermittent fashion, asking for systemic symptoms every 0.5cc of injectate. The needles were then removed and the area cleansed, making sure to leave some of the prepping solution back to take advantage of its long term bactericidal properties. EBL: None Materials & Medications Used:  Needle(s) Used: 20g - 10cm, Tuohy-style epidural needle Medication(s): Please see chart orders for medication and dosing details.  Imaging Guidance:  Type of Imaging Technique: Fluoroscopy Guidance (Spinal) Indication(s): Assistance in needle guidance and placement for procedures requiring needle placement in or near specific anatomical locations not easily accessible without such assistance. Exposure Time: Please see nurses notes. Contrast: None used. Fluoroscopic Guidance: I was personally present in the fluoroscopy suite, where the patient was placed in position for the procedure, over the fluoroscopy-compatible table. Fluoroscopy was manipulated, using "Tunnel Vision Technique", to obtain the best possible view of the target area, on the affected side. Parallax error was corrected before commencing the procedure. A "direction-depth-direction" technique  was used to introduce the needle under continuous pulsed fluoroscopic guidance. Once the target was reached, antero-posterior, oblique, and lateral fluoroscopic projection views were taken to confirm needle placement in all planes. Permanently recorded images stored by scanning into EMR. Interpretation: No contrast injected. Intraoperative imaging interpretation by performing Physician. Adequate needle placement confirmed in AP & Oblique Views. Permanent images scanned into the patient's record.  Antibiotic Prophylaxis:  Indication(s): No indications identified. Type:  Antibiotics Given (last 72 hours)    None       Post-operative Assessment:   Complications: No immediate post-treatment complications were observed. Relevant Post-operative Information:  Disposition: Return to clinic for follow-up evaluation. The patient tolerated the entire procedure well. A repeat set of vitals were taken after the procedure and the patient was kept under observation following institutional policy, for this procedure. Post-procedural neurological assessment was performed, showing return to baseline, prior to discharge. The patient was discharged home, once institutional criteria were met. The patient was provided with post-procedure discharge instructions, including a section on how to identify potential problems. Should any problems arise concerning this procedure, the patient was given instructions to immediately contact us, at any time, without hesitation. In any case, we plan to contact the patient by telephone for a follow-up status report regarding this interventional procedure. Comments:  No additional relevant information.  Plan of Care   Problem List Items Addressed This Visit      High   Chronic lower extremity pain (Location of Tertiary source of pain) (Bilateral) (L>R) (Chronic)   Relevant Orders   Lumbar Epidural Injection   Chronic lumbar radicular pain (L5 dermatome) (Location of Tertiary  source of pain) (Left) - Primary (Chronic)   Relevant Medications   triamcinolone acetonide (KENALOG-40) injection 40 mg (Completed)   lidocaine (PF) (XYLOCAINE) 1 % injection 10 mL (Completed)   sodium chloride flush (NS) 0.9 % injection 2 mL (Completed)   ropivacaine (PF) 2 mg/ml (0.2%) (NAROPIN) epidural 2 mL (Completed)   Other Relevant Orders   Lumbar Epidural Injection    Other Visit Diagnoses   None.     Requested PM Follow-up: Return in about 2 weeks (around 09/24/2015) for  Schedule Procedure.  Future Appointments Date Time Provider [place]  10/02/2015 9:20 AM Milinda Pointer, MD [facility] None    Primary Care Physician: [facility] Location: [facility] Note by: Kathlen Brunswick. Dossie Arbour, M.D, DABA, DABAPM, DABPM, DABIPP, FIPP  Disclaimer:  Medicine is not an exact science. The only guarantee in medicine is that nothing is guaranteed. It is important to note that the decision to proceed with this intervention was based on the information collected from the patient. The Data and conclusions were drawn from the patient's questionnaire, the interview, and the physical examination. Because the information was provided in large part by the patient, it cannot be guaranteed that it has not been purposely or unconsciously manipulated. Every effort has been made to obtain as much relevant data as possible for this evaluation. It is important to note that the conclusions that lead to this procedure are derived in large part from the available data. Always take into account that the treatment will also be dependent on availability of resources and existing treatment guidelines, considered by other Pain Management Practitioners as being common knowledge and practice, at the time of the intervention. For Medico-Legal purposes, it is also important to point out that variation in procedural techniques and pharmacological choices are the  acceptable norm. The indications, contraindications, technique, and results of the above procedure should only be interpreted and judged by a Board-Certified Interventional Pain Specialist with extensive familiarity and expertise in the same exact procedure and technique. Attempts at providing opinions without similar or greater experience and expertise than that of the treating physician will be considered as inappropriate and unethical, and shall result in a formal complaint to the state medical board and applicable specialty societies.

## 2015-09-10 NOTE — Patient Instructions (Signed)
Pain Management Discharge Instructions  General Discharge Instructions :  If you need to reach your doctor call: Monday-Friday 8:00 am - 4:00 pm at [phone] or toll free [phone].  After clinic hours [phone] to have operator reach doctor.  Bring all of your medication bottles to all your appointments in the pain clinic.  To cancel or reschedule your appointment with Pain Management please remember to call 24 hours in advance to avoid a fee.  Refer to the educational materials which you have been given on: General Risks, I had my Procedure. Discharge Instructions, Post Sedation.  Post Procedure Instructions:  The drugs you were given will stay in your system until tomorrow, so for the next 24 hours you should not drive, make any legal decisions or drink any alcoholic beverages.  You may eat anything you prefer, but it is better to start with liquids then soups and crackers, and gradually work up to solid foods.  Please notify your doctor immediately if you have any unusual bleeding, trouble breathing or pain that is not related to your normal pain.  Depending on the type of procedure that was done, some parts of your body may feel week and/or numb.  This usually clears up by tonight or the next day.  Walk with the use of an assistive device or accompanied by an adult for the 24 hours.  You may use ice on the affected area for the first 24 hours.  Put ice in a Ziploc bag and cover with a towel and place against area 15 minutes on 15 minutes off.  You may switch to heat after 24 hours.Epidural Steroid Injection Patient Information  Description: The epidural space surrounds the nerves as they exit the spinal cord.  In some patients, the nerves can be compressed and inflamed by a bulging disc or a tight spinal canal (spinal stenosis).  By injecting steroids into the epidural space, we can bring irritated nerves into direct contact with a potentially helpful medication.  These  steroids act directly on the irritated nerves and can reduce swelling and inflammation which often leads to decreased pain.  Epidural steroids may be injected anywhere along the spine and from the neck to the low back depending upon the location of your pain.   After numbing the skin with local anesthetic (like Novocaine), a small needle is passed into the epidural space slowly.  You may experience a sensation of pressure while this is being done.  The entire block usually last less than 10 minutes.  Conditions which may be treated by epidural steroids:   Low back and leg pain  Neck and arm pain  Spinal stenosis  Post-laminectomy syndrome  Herpes zoster (shingles) pain  Pain from compression fractures  Preparation for the injection:  1. Do not eat any solid food or dairy products within 8 hours of your appointment.  2. You may drink clear liquids up to 3 hours before appointment.  Clear liquids include water, black coffee, juice or soda.  No milk or cream please. 3. You may take your regular medication, including pain medications, with a sip of water before your appointment  Diabetics should hold regular insulin (if taken separately) and take 1/2 normal NPH dos the morning of the procedure.  Carry some sugar containing items with you to your appointment. 4. A driver must accompany you and be prepared to drive you home after your procedure.  5. Bring all your current medications with your. 6. An IV may be inserted and   sedation may be given at the discretion of the physician.   7. A blood pressure cuff, EKG and other monitors will often be applied during the procedure.  Some patients may need to have extra oxygen administered for a short period. 8. You will be asked to provide medical information, including your allergies, prior to the procedure.  We must know immediately if you are taking blood thinners (like Coumadin/Warfarin)  Or if you are allergic to IV iodine contrast (dye). We must  know if you could possible be pregnant.  Possible side-effects:  Bleeding from needle site  Infection (rare, may require surgery)  Nerve injury (rare)  Numbness & tingling (temporary)  Difficulty urinating (rare, temporary)  Spinal headache ( a headache worse with upright posture)  Light -headedness (temporary)  Pain at injection site (several days)  Decreased blood pressure (temporary)  Weakness in arm/leg (temporary)  Pressure sensation in back/neck (temporary)  Call if you experience:  Fever/chills associated with headache or increased back/neck pain.  Headache worsened by an upright position.  New onset weakness or numbness of an extremity below the injection site  Hives or difficulty breathing (go to the emergency room)  Inflammation or drainage at the infection site  Severe back/neck pain  Any new symptoms which are concerning to you  Please note:  Although the local anesthetic injected can often make your back or neck feel good for several hours after the injection, the pain will likely return.  It takes 3-7 days for steroids to work in the epidural space.  You may not notice any pain relief for at least that one week.  If effective, we will often do a series of three injections spaced 3-6 weeks apart to maximally decrease your pain.  After the initial series, we generally will wait several months before considering a repeat injection of the same type.  If you have any questions, please call [phone] [facility] 

## 2015-09-11 ENCOUNTER — Telehealth: Payer: Self-pay | Admitting: *Deleted

## 2015-09-11 NOTE — Telephone Encounter (Signed)
Patient denies complications post procedure. 

## 2015-10-02 ENCOUNTER — Encounter: Payer: Self-pay | Admitting: Pain Medicine

## 2015-10-02 ENCOUNTER — Ambulatory Visit: Payer: Medicare Other | Attending: Pain Medicine | Admitting: Pain Medicine

## 2015-10-02 VITALS — BP 130/81 | HR 82 | Temp 98.4°F | Resp 18 | Ht 69.0 in | Wt 245.0 lb

## 2015-10-02 DIAGNOSIS — M5416 Radiculopathy, lumbar region: Secondary | ICD-10-CM

## 2015-10-02 DIAGNOSIS — F419 Anxiety disorder, unspecified: Secondary | ICD-10-CM | POA: Insufficient documentation

## 2015-10-02 DIAGNOSIS — Z951 Presence of aortocoronary bypass graft: Secondary | ICD-10-CM | POA: Insufficient documentation

## 2015-10-02 DIAGNOSIS — J449 Chronic obstructive pulmonary disease, unspecified: Secondary | ICD-10-CM | POA: Diagnosis not present

## 2015-10-02 DIAGNOSIS — Z794 Long term (current) use of insulin: Secondary | ICD-10-CM | POA: Diagnosis not present

## 2015-10-02 DIAGNOSIS — R1310 Dysphagia, unspecified: Secondary | ICD-10-CM | POA: Diagnosis not present

## 2015-10-02 DIAGNOSIS — I5022 Chronic systolic (congestive) heart failure: Secondary | ICD-10-CM | POA: Insufficient documentation

## 2015-10-02 DIAGNOSIS — E1165 Type 2 diabetes mellitus with hyperglycemia: Secondary | ICD-10-CM | POA: Diagnosis not present

## 2015-10-02 DIAGNOSIS — Z79891 Long term (current) use of opiate analgesic: Secondary | ICD-10-CM | POA: Diagnosis not present

## 2015-10-02 DIAGNOSIS — M47896 Other spondylosis, lumbar region: Secondary | ICD-10-CM | POA: Insufficient documentation

## 2015-10-02 DIAGNOSIS — G8929 Other chronic pain: Secondary | ICD-10-CM

## 2015-10-02 DIAGNOSIS — E669 Obesity, unspecified: Secondary | ICD-10-CM | POA: Diagnosis not present

## 2015-10-02 DIAGNOSIS — R911 Solitary pulmonary nodule: Secondary | ICD-10-CM | POA: Diagnosis not present

## 2015-10-02 DIAGNOSIS — E1140 Type 2 diabetes mellitus with diabetic neuropathy, unspecified: Secondary | ICD-10-CM | POA: Insufficient documentation

## 2015-10-02 DIAGNOSIS — E785 Hyperlipidemia, unspecified: Secondary | ICD-10-CM | POA: Insufficient documentation

## 2015-10-02 DIAGNOSIS — Z6836 Body mass index (BMI) 36.0-36.9, adult: Secondary | ICD-10-CM | POA: Insufficient documentation

## 2015-10-02 DIAGNOSIS — F1190 Opioid use, unspecified, uncomplicated: Secondary | ICD-10-CM

## 2015-10-02 DIAGNOSIS — M47816 Spondylosis without myelopathy or radiculopathy, lumbar region: Secondary | ICD-10-CM

## 2015-10-02 DIAGNOSIS — M170 Bilateral primary osteoarthritis of knee: Secondary | ICD-10-CM | POA: Diagnosis not present

## 2015-10-02 DIAGNOSIS — M533 Sacrococcygeal disorders, not elsewhere classified: Secondary | ICD-10-CM

## 2015-10-02 DIAGNOSIS — I2510 Atherosclerotic heart disease of native coronary artery without angina pectoris: Secondary | ICD-10-CM | POA: Diagnosis not present

## 2015-10-02 DIAGNOSIS — I110 Hypertensive heart disease with heart failure: Secondary | ICD-10-CM | POA: Diagnosis not present

## 2015-10-02 DIAGNOSIS — Z79899 Other long term (current) drug therapy: Secondary | ICD-10-CM | POA: Insufficient documentation

## 2015-10-02 DIAGNOSIS — M25569 Pain in unspecified knee: Secondary | ICD-10-CM | POA: Diagnosis not present

## 2015-10-02 DIAGNOSIS — K219 Gastro-esophageal reflux disease without esophagitis: Secondary | ICD-10-CM | POA: Insufficient documentation

## 2015-10-02 DIAGNOSIS — I252 Old myocardial infarction: Secondary | ICD-10-CM | POA: Insufficient documentation

## 2015-10-02 DIAGNOSIS — M545 Low back pain: Secondary | ICD-10-CM | POA: Diagnosis not present

## 2015-10-02 DIAGNOSIS — M79606 Pain in leg, unspecified: Secondary | ICD-10-CM

## 2015-10-02 MED ORDER — OXYCODONE HCL 15 MG PO TABS: 15.0000 mg | ORAL_TABLET | Freq: Four times a day (QID) | ORAL | 0 refills | Status: DC | PRN

## 2015-10-02 NOTE — Progress Notes (Signed)
Safety precautions to be maintained throughout the outpatient stay will include: orient to surroundings, keep bed in low position, maintain call bell within reach at all times, provide assistance with transfer out of bed and ambulation.   Oxycodone 15 mg 9 out of 120 remaining. Filled 09-05-15.

## 2015-10-02 NOTE — Progress Notes (Signed)
Patient's Name: Manuel Knight  MRN: [id number]  Referring Provider: [facility]  DOB: [birth year]  PCP: [facility]  DOS: 10/02/2015  Note by: Kathlen Brunswick. Dossie Arbour, MD  Service setting: Ambulatory outpatient  Specialty: Interventional Pain Management  Location: [facility] (AMB) Pain Management Facility    Patient type: Established   Primary Reason(s) for Visit: Encounter for prescription drug management & post-procedure evaluation of chronic illness with mild to moderate exacerbation(Level of risk: moderate) CC: Back Pain (lower) and Knee Pain (left)  HPI  Manuel Knight is a 55 y.o. year old, male patient, who comes today for an initial evaluation. He has Type 2 diabetes mellitus with hyperglycemia, with long-term current use of insulin ([place]); HLD (hyperlipidemia); Obesity, unspecified; Anxiety state; Essential hypertension; COPD; PULMONARY NODULE; HERNIA; GERD (gastroesophageal reflux disease); Esophageal dysphagia; Chronic diarrhea; Accelerated hypertension; NSTEMI (non-ST elevated myocardial infarction) ([place]); CAD (coronary artery disease), native coronary artery; S/P CABG x 3; Chronic systolic heart failure ([place]); Emphysema of lung ([place]); Disorder of joint of spine; H/O coronary artery bypass surgery; Chronic pain; Encounter for pain management planning; Encounter for therapeutic drug level monitoring; Opiate use (90 MME/Day); Chronic low back pain (Location of Secondary source of pain) (Bilateral) (L>R); Chronic knee pain (Location of Primary Source of Pain) (Bilateral) (L>R); Chronic foot pain (Bilateral) (L>R); Disturbance of skin sensation; Diabetic peripheral neuropathy (HCC) (lower extremity); Chronic lumbar radicular pain (L5 dermatome) (Location of Tertiary source of pain) (Left); 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) (L>R); Osteoarthritis of knee (Bilateral) (L>R); Lumbar spondylosis; Lumbar facet syndrome (Location of  Secondary source of pain) (Bilateral) (L>R); Lumbar facet arthropathy; Chronic lower extremity pain (Location of Tertiary source of pain) (Bilateral) (L>R); History of marijuana use; Hypomagnesemia; Vitamin D insufficiency; Long term current use of opiate analgesic; and Long term prescription opiate use on his problem list.. His primarily concern today is the Back Pain (lower) and Knee Pain (left)  Pain Assessment: Self-Reported Pain Score: 7 /10 Clinically the patient looks like a 3/10 Reported level is inconsistent with clinical observations. Information on the proper use of the pain score provided to the patient today. Pain Type: Chronic pain Pain Location: Back Pain Orientation: Lower Pain Descriptors / Indicators: Throbbing, Shooting Pain Frequency: Intermittent  The patient comes into the clinics today for post-procedure evaluation on the interventional treatment done on 09/10/2015. In addition, he comes in today for pharmacological management of his chronic pain.  The patient  reports that he uses drugs, including Marijuana.  Date of Last Visit: 09/10/15 Service Provided on Last Visit: Procedure  Controlled Substance Pharmacotherapy Assessment & REMS (Risk Evaluation and Mitigation Strategy)  Analgesic:Oxycodone IR 15 mg every 6 hours (60 mg/day) MME/day:90 mg/day. Pill Count: Oxycodone 15 mg 9 out of 120 remaining. Filled 09-05-15. Pharmacokinetics: Onset of action (Liberation/Absorption): Within expected pharmacological parameters Time to Peak effect (Distribution): Timing and results are as within normal expected parameters Duration of action (Metabolism/Excretion): Within normal limits for medication Pharmacodynamics: Analgesic Effect: More than 50% Activity Facilitation: Medication(s) allow patient to sit, stand, walk, and do the basic ADLs Perceived Effectiveness: Described as relatively effective, allowing for increase in activities of daily living (ADL) Side-effects or  Adverse reactions: None reported Monitoring: [place] PMP: Online review of the past 62-month period conducted. Compliant with practice rules and regulations List of all UDS test(s) done:  Lab Results  Component Value Date   TOXASSSELUR FINAL 08/27/2015   SUMMARY FINAL 06/13/2015   Last UDS on  record: ToxAssure Select 13  Date Value Ref Range Status  08/27/2015 FINAL  Final    Comment:    ==================================================================== TOXASSURE SELECT 13 (MW) ==================================================================== Test                             Result       Flag       Units Drug Present and Declared for Prescription Verification   Alprazolam                     128          EXPECTED   ng/mg creat   Alpha-hydroxyalprazolam        63           EXPECTED   ng/mg creat    Source of alprazolam is a scheduled prescription medication.    Alpha-hydroxyalprazolam is an expected metabolite of alprazolam.   Oxycodone                      1064         EXPECTED   ng/mg creat   Oxymorphone                    107          EXPECTED   ng/mg creat   Noroxycodone                   576          EXPECTED   ng/mg creat    Sources of oxycodone include scheduled prescription medications.    Oxymorphone and noroxycodone are expected metabolites of    oxycodone. Oxymorphone is also available as a scheduled    prescription medication. Drug Present not Declared for Prescription Verification   Alcohol, Ethyl                 >0.400       UNEXPECTED g/dL    Sources of ethyl alcohol include alcoholic beverages or as a    fermentation product of glucose; glucose is present in this    specimen.  The high concentration of ethyl alcohol and the    presence of glucose supports fermentation as the source of ethyl    alcohol in this specimen. Drug Absent but Declared for Prescription Verification   Amphetamine                    Not Detected UNEXPECTED ng/mg  creat ==================================================================== Test                      Result    Flag   Units      Ref Range   Creatinine              102              mg/dL      >=20 ==================================================================== Declared Medications:  The flagging and interpretation on this report are based on the  following declared medications.  Unexpected results may arise from  inaccuracies in the declared medications.  **Note: The testing scope of this panel includes these medications:  Alprazolam (Xanax)  Amphetamine (Adderall)  Oxycodone (Roxicodone)  **Note: The testing scope of this panel does not include following  reported medications:  Albuterol (ProAir)  Albuterol (Proventil)  Amlodipine (Norvasc)  Aspirin  Atorvastatin (Lipitor)  Benazepril (Lotensin)  Canagliflozin (  Invokana)  Carvedilol (Coreg)  Citalopram (Celexa)  Clonidine (Catapres)  Duloxetine (Cymbalta)  Fluticasone (Advair)  Gabapentin  Insulin (Levemir)  Insulin (NovoLog)  Magnesium  Meloxicam (Mobic)  Metformin (Glucophage)  Mupirocin (Bactroban)  Naloxone  Nitroglycerin (Nitrostat)  Pantoprazole (Protonix)  Sacubitril (Entresto)  Salmeterol (Advair)  Tiotropium (Spiriva)  Valsartan (Entresto)  Vitamin D2 (Drisdol) ==================================================================== For clinical consultation, please call [phone]. ====================================================================    Summary  Date Value Ref Range Status  06/13/2015 FINAL  Final    Comment:    ==================================================================== TOXASSURE COMP DRUG ANALYSIS,UR ==================================================================== Test                             Result       Flag       Units Drug Present and Declared for Prescription Verification   Citalopram                     PRESENT      EXPECTED   Desmethylcitalopram             PRESENT      EXPECTED    Desmethylcitalopram is an expected metabolite of citalopram or    the enantiomeric form, escitalopram. Drug Present not Declared for Prescription Verification   Alcohol, Ethyl                 0.381        UNEXPECTED g/dL    Sources of ethyl alcohol include alcoholic beverages or as a    fermentation product of glucose; glucose is present in this    specimen.  The high concentration of ethyl alcohol and the    presence of glucose supports fermentation as the source of ethyl    alcohol in this specimen. Drug Absent but Declared for Prescription Verification   Amphetamine                    Not Detected UNEXPECTED ng/mg creat   Alprazolam                     Not Detected UNEXPECTED ng/mg creat   Salicylate                     Not Detected UNEXPECTED    Aspirin, as indicated in the declared medication list, is not    always detected even when used as directed. ==================================================================== Test                      Result    Flag   Units      Ref Range   Creatinine              80               mg/dL      >=20 ==================================================================== Declared Medications:  The flagging and interpretation on this report are based on the  following declared medications.  Unexpected results may arise from  inaccuracies in the declared medications.  **Note: The testing scope of this panel includes these medications:  Alprazolam (Xanax)  Amphetamine (Adderall)  Citalopram (Celexa)  **Note: The testing scope of this panel does not include small to  moderate amounts of these reported medications:  Aspirin (Aspirin 81)  **Note: The testing scope of this panel does not include following  reported medications:  Albuterol (ProAir HFA)  Amlodipine (Norvasc)  Atorvastatin (Lipitor)  Canagliflozin (Invokana)  Fluticasone (Advair)  Insulin (NovoLog)  Meloxicam (Mobic)  Metformin (Glucophage)   Mupirocin (Bactroban)  Nitroglycerin (Nitrostat)  Pantoprazole (Protonix)  Sacubitril (Entresto)  Salmeterol (Advair)  Tiotropium (Spiriva)  Valsartan (Entresto) ==================================================================== For clinical consultation, please call [phone]. ====================================================================    UDS interpretation: Compliant          Medication Assessment Form: Reviewed. Patient indicates being compliant with therapy Treatment compliance: Compliant Risk Assessment: Aberrant Behavior: None observed today Substance Use Disorder (SUD) Risk Level: High Risk of opioid abuse or dependence: 0.7-3.0% with doses ? 36 MME/day and 6.1-26% with doses ? 120 MME/day. Opioid Risk Tool (ORT) Score: 3 Low Risk for SUD (Score <3) Depression Scale Score: PHQ-2: 0 No depression (0) PHQ-9: 0 No depression (0-4) Risk Mitigation Strategies:  Patient Counseling:  Completed Patient-Prescriber Agreement (PPA): Present and active  Notification to other healthcare providers: Done   Pharmacologic Plan: No change in therapy, at this time  Post-Procedure Assessment  Procedure done on 09/10/2015: Left L4-5 lumbar epidural steroid injection #2, without sedation. Complications experienced at the time of the procedure: None Side-effects or Adverse reactions: None reported Sedation: No sedation used. When no sedatives are used, the analgesic levels obtained are directly associated with the effectiveness of the local anesthetics. On the other hand, when sedation is provided, the level of analgesia obtained during the initial 1 hour immediately following the intervention, is believed to be the result of a combination of factors. These factors may include, but are not limited to: 1. The effectiveness of the local anesthetics used. 2. The effects of the analgesic(s) and/or anxiolytic(s) used. 3. The degree of discomfort experienced by the patient at the time  of the procedure. 4. The patients ability and reliability in recalling and recording the events. 5. The presence and influence of possible secondary gains. Results: Relief during the 1st hour after the procedure: 75 % (Ultra-Short Term Relief) Interpretation note: Analgesia during this period is likely to be Local Anesthetic and/or IV Sedative (Analgesic/Anxiolitic) related Local Anesthesia: Long-acting (4-6 hours) anesthetics used. The analgesic levels attained during this period are directly associated to the localized infiltration of local anesthetics and therefore cary significant diagnostic value as to the etiological location or origin of the pain. Results: Relief during the next 4 to 6 hour after the procedure: 65 % (Short Term Relief) Interpretation note: Complete relief confirms area to be the source of pain Long-Term Therapy: Steroids used. Results: Extended relief following procedure: 40 % Interpretation note: Long-term benefit would suggest an inflammatory etiology to the pain         Long-Term Benefits:  Current Relief (Now): <50%  Interpretation note: Persistent relief would suggest effective anti-inflammatory effects from steroids Interpretation of Results: The results would suggest therapy to be positively impacting the patient's condition.  Laboratory Chemistry  Inflammation Markers Lab Results  Component Value Date   ESRSEDRATE 2 06/13/2015   CRP 0.8 06/13/2015   Renal Function Lab Results  Component Value Date   BUN 15 06/13/2015   CREATININE 0.98 06/13/2015   GFRAA >60 06/13/2015   GFRNONAA >60 06/13/2015   Hepatic Function Lab Results  Component Value Date   AST 17 06/13/2015   ALT 19 06/13/2015   ALBUMIN 4.0 06/13/2015   Electrolytes Lab Results  Component Value Date   NA 138 06/13/2015   K 4.6 06/13/2015   CL 107 06/13/2015   CALCIUM 9.4 06/13/2015   MG 1.4 (L) 06/13/2015   Pain Modulating Vitamins Lab Results  Component  Value Date   VD25OH 31  02/03/2010   25OHVITD1 25 (L) 06/13/2015   25OHVITD2 <1.0 06/13/2015   25OHVITD3 25 06/13/2015   VITAMINB12 1,002 (H) 06/13/2015   Coagulation Parameters Lab Results  Component Value Date   INR 1.59 (H) 02/06/2013   LABPROT 18.5 (H) 02/06/2013   APTT 40 (H) 02/06/2013   PLT 307 03/15/2015   Cardiovascular Lab Results  Component Value Date   HGB 14.5 03/15/2015   HCT 45.5 03/15/2015   Note: Lab results reviewed.  Recent Diagnostic Imaging  Manuel Knight  Result Date: 06/21/2015 CLINICAL DATA:  Low back pain radiating to both knees for approximately 10 years. No known injury. EXAM: MRI LUMBAR SPINE WITHOUT Knight TECHNIQUE: Multiplanar, multisequence Manuel imaging of the lumbar spine was performed. No intravenous Knight was administered. COMPARISON:  MRI lumbar spine 06/17/2010. FINDINGS: Segmentation:  Unremarkable. Alignment:  Normal. Vertebrae: No fracture or marrow signal abnormality. Small Schmorl's nodes in the superior endplates of L1, L2 and L3 are unchanged. Conus medullaris: Extends to the T12-L1 level and appears normal. Paraspinal and other soft tissues: Unremarkable. Disc levels: T12-L1 is imaged in the sagittal plane only and negative. L1-2: Negative. L2-3: Negative. L3-4: Negative. L4-5: Mild facet degenerative change and a minimal disc bulge without central canal or foraminal stenosis. The appearance is stable. L5-S1: Shallow, broad-based central and eccentric to the left protrusion without central canal or foraminal stenosis. The appearance is unchanged. IMPRESSION: No change in mild lower lumbar degenerative disease without central canal or foraminal narrowing. Electronically Signed   By: Inge Rise M.D.   On: 06/21/2015 10:10    Meds  The patient has a current medication list which includes the following prescription(s): albuterol, alprazolam, amlodipine, amphetamine-dextroamphetamine, aspirin ec, atorvastatin, benazepril, canagliflozin, carvedilol,  citalopram, clonidine, d3 super strength, duloxetine, entresto, fluticasone, fluticasone-salmeterol, gabapentin, insulin aspart, insulin pen needle, insulin pen needle, insulin syringe-needle u-100, insulin syringe-needle u-100, levemir flextouch, litetouch pen needles, magnesium oxide, meloxicam, metformin, mupirocin ointment, naloxone hcl, nitroglycerin, oxycodone, pantoprazole, proair hfa, sure comfort lancets 30g, tiotropium, truetrack test, and vitamin d (ergocalciferol).  Current Outpatient Prescriptions on File Prior to Visit  Medication Sig  . albuterol (PROAIR HFA) 108 (90 Base) MCG/ACT inhaler Inhale into the lungs.  Marland Kitchen alprazolam (XANAX) 2 MG tablet Take 1 tablet by mouth 3 (three) times daily as needed.   Marland Kitchen amLODipine (NORVASC) 10 MG tablet Take 10 mg by mouth daily.  Marland Kitchen amphetamine-dextroamphetamine (ADDERALL) 20 MG tablet Take 20 mg by mouth 3 (three) times daily.   Marland Kitchen aspirin EC 81 MG tablet Take 1 tablet (81 mg total) by mouth daily.  Marland Kitchen atorvastatin (LIPITOR) 80 MG tablet Take 1 tablet by mouth daily.  . benazepril (LOTENSIN) 20 MG tablet Take by mouth.  . Canagliflozin (INVOKANA) 100 MG TABS Take 100 mg by mouth daily.  . carvedilol (COREG) 25 MG tablet Take 25 mg by mouth 2 (two) times daily with a meal.   . citalopram (CELEXA) 20 MG tablet Take 1 tablet by mouth daily.  . cloNIDine (CATAPRES) 0.3 MG tablet Take 0.3 mg by mouth 2 (two) times daily.  . D3 SUPER STRENGTH 2000 units CAPS Take 1 capsule by mouth daily.  . DULoxetine (CYMBALTA) 30 MG capsule 1 tab daily for one day, then 2 tabs daily  . ENTRESTO 97-103 MG TAKE ONE TABLET BY MOUTH TWICE DAILY.  . fluticasone (FLONASE) 50 MCG/ACT nasal spray USE TWO SPRAYS IN EACH NOSTRIL TWICE DAILY FOR 7 DAYS, THEN USE TWO  SPRAYS ONCE DAILY AS DIRECTED.  Marland Kitchen Fluticasone-Salmeterol (ADVAIR DISKUS) 500-50 MCG/DOSE AEPB Inhale 1 puff into the lungs every 12 (twelve) hours.    . gabapentin (NEURONTIN) 600 MG tablet Take 600 mg by mouth 3  (three) times daily.  . insulin aspart (NOVOLOG FLEXPEN) 100 UNIT/ML FlexPen Inject 0-26 Units into the skin 3 (three) times daily with meals. 20 units three times a day with sliding scale max of 26 units  . Insulin Pen Needle (LITETOUCH PEN NEEDLES) 29G X 12.7MM MISC Use as directed.  . Insulin Pen Needle (PRODIGY INSULIN PEN NEEDLES) 29G X 13MM MISC Use as directed.  . Insulin Syringe-Needle U-100 (B-D INS SYR ULTRAFINE 1CC/31G) 31G X 5/16" 1 ML MISC   . Insulin Syringe-Needle U-100 (B-D INS SYRINGE 0.5CC/31GX5/16) 31G X 5/16" 0.5 ML MISC   . LEVEMIR FLEXTOUCH 100 UNIT/ML Pen INJECT 20 UNITS EVERY MORNING.  . LITETOUCH PEN NEEDLES 29G X 12.7MM MISC   . Magnesium Oxide 500 MG CAPS Take 1 capsule (500 mg total) by mouth 2 (two) times daily at 8 am and 10 pm.  . meloxicam (MOBIC) 15 MG tablet Take 15 mg by mouth 2 (two) times daily.  . metFORMIN (GLUCOPHAGE) 1000 MG tablet Take 1,000 mg by mouth 2 (two) times daily with a meal.  . mupirocin ointment (BACTROBAN) 2 % Apply topically.  . naloxone HCl 4 MG/0.1ML LIQD One spray in either nostril once for known/suspected opioid overdose. May repeat every 2-3 minutes in alternating nostril til EMS arrives  . nitroGLYCERIN (NITROSTAT) 0.4 MG SL tablet Place 1 tablet (0.4 mg total) under the tongue every 5 (five) minutes as needed for chest pain.  . pantoprazole (PROTONIX) 40 MG tablet 1 tablet Two (2) times a day.  Marland Kitchen PROAIR HFA 108 (90 BASE) MCG/ACT inhaler Inhale 2 puffs into the lungs every 4 (four) hours as needed.  . SURE COMFORT LANCETS 30G MISC USE TO CHECK SUGAR THREE TIMES DAILY {AS DIRECTED]  . tiotropium (SPIRIVA) 18 MCG inhalation capsule Place 18 mcg into inhaler and inhale daily.    Angelia Mould TEST test strip USE TO CHECK BLOOD SUGAR THREE TIMES DAILY   No current facility-administered medications on file prior to visit.     ROS  Constitutional: Denies any fever or chills Gastrointestinal: No reported hemesis, hematochezia, vomiting, or  acute GI distress Musculoskeletal: Denies any acute onset joint swelling, redness, loss of ROM, or weakness Neurological: No reported episodes of acute onset apraxia, aphasia, dysarthria, agnosia, amnesia, paralysis, loss of coordination, or loss of consciousness  Allergies  Manuel Knight is allergic to lodine [etodolac]; tylenol [acetaminophen]; iodine; and propoxyphene n-acetaminophen.  [place]  Medical:  Manuel Knight  has a past medical history of Anxiety; Cellulitis (2011); Chronic back pain; Claustrophobia; COPD (chronic obstructive pulmonary disease) ([place]); Diabetes mellitus, type 2 ([place]); Diverticulitis of colon (2008); GERD (gastroesophageal reflux disease); Heart attack ([place]); Hyperlipidemia; Hypertension; Obesity; and Pulmonary nodule. Family: family history includes Cancer in his mother; Coronary artery disease in his mother and sister; Diabetes in his sister; Heart attack in his brother and father. Surgical:  has a past surgical history that includes Appendectomy (2007); Tonsillectomy; Coronary artery bypass graft (N/A, 02/06/2013); and left heart catheterization with coronary angiogram (N/A, 02/03/2013). Tobacco:  reports that he quit smoking about 26 years ago. His smoking use included Cigarettes. He started smoking about 48 years ago. He has a 28.50 pack-year smoking history. He has never used smokeless tobacco. Alcohol:  reports that he does not drink alcohol. Drug:  reports that he uses drugs, including Marijuana.  Constitutional Exam  General appearance: Well nourished, well developed, and well hydrated. In no acute distress Vitals:   10/02/15 0933  BP: 130/81  Pulse: 82  Resp: 18  Temp: 98.4 F (36.9 C)  TempSrc: Oral  SpO2: 98%  Weight: 245 lb (111.1 kg)  Height: 5\' 9"  (1.753 m)  BMI Assessment: Estimated body mass index is 36.18 kg/m as calculated from the following:   Height as of this encounter: 5\' 9"  (1.753 m).   Weight as of this encounter: 245 lb (111.1 kg).   BMI  interpretation:           BMI Readings from Last 4 Encounters:  10/02/15 36.18 kg/m  09/10/15 35.88 kg/m  08/27/15 36.92 kg/m  08/06/15 35.74 kg/m   Wt Readings from Last 4 Encounters:  10/02/15 245 lb (111.1 kg)  09/10/15 243 lb (110.2 kg)  08/27/15 250 lb (113.4 kg)  08/06/15 242 lb (109.8 kg)  Psych/Mental status: Alert and oriented x 3 (person, place, & time) Eyes: PERLA Respiratory: No evidence of acute respiratory distress  Cervical Spine Exam  Inspection: No masses, redness, or swelling Alignment: Symmetrical Functional ROM: ROM appears unrestricted Stability: No instability detected Muscle strength & Tone: Functionally intact Sensory: Unimpaired Palpation: Non-contributory  Upper Extremity (UE) Exam    Side: Right upper extremity  Side: Left upper extremity  Inspection: No masses, redness, swelling, or asymmetry  Inspection: No masses, redness, swelling, or asymmetry  Functional ROM: ROM appears unrestricted          Functional ROM: ROM appears unrestricted          Muscle strength & Tone: Functionally intact  Muscle strength & Tone: Functionally intact  Sensory: Unimpaired  Sensory: Unimpaired  Palpation: Non-contributory  Palpation: Non-contributory   Thoracic Spine Exam  Inspection: No masses, redness, or swelling Alignment: Symmetrical Functional ROM: ROM appears unrestricted Stability: No instability detected Sensory: Unimpaired Muscle strength & Tone: Functionally intact Palpation: Non-contributory  Lumbar Spine Exam  Inspection: No masses, redness, or swelling Alignment: Symmetrical Functional ROM: ROM appears unrestricted Stability: No instability detected Muscle strength & Tone: Functionally intact Sensory: Unimpaired Palpation: Non-contributory Provocative Tests: Lumbar Hyperextension and rotation test: evaluation deferred today       Patrick's Maneuver: evaluation deferred today              Gait & Posture Assessment  Ambulation:  Unassisted Gait: Relatively normal for age and body habitus Posture: WNL   Lower Extremity Exam    Side: Right lower extremity  Side: Left lower extremity  Inspection: No masses, redness, swelling, or asymmetry  Inspection: No masses, redness, swelling, or asymmetry  Functional ROM: ROM appears unrestricted          Functional ROM: ROM appears unrestricted          Muscle strength & Tone: Functionally intact  Muscle strength & Tone: Functionally intact  Sensory: Unimpaired  Sensory: Unimpaired  Palpation: Non-contributory  Palpation: Non-contributory   Assessment  Primary Diagnosis & Pertinent Problem List: The primary encounter diagnosis was Chronic pain. Diagnoses of Long term current use of opiate analgesic, Opiate use (90 MME/Day), Chronic knee pain, unspecified laterality, Primary osteoarthritis of both knees, Chronic low back pain (Location of Secondary source of pain) (Bilateral) (L>R), 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) (L>R), Chronic pain of lower extremity, unspecified laterality, Chronic lumbar radicular pain (L5 dermatome) (Location of Tertiary source of pain) (Left), and Lumbar facet syndrome (  Location of Secondary source of pain) (Bilateral) (L>R) were also pertinent to this visit.  Visit Diagnosis: 1. Chronic pain   2. Long term current use of opiate analgesic   3. Opiate use (90 MME/Day)   4. Chronic knee pain, unspecified laterality   5. Primary osteoarthritis of both knees   6. Chronic low back pain (Location of Secondary source of pain) (Bilateral) (L>R)   7. 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 Chronic pain of lower extremity, unspecified laterality   9. Chronic lumbar radicular pain (L5 dermatome) (Location of Tertiary source of pain) (Left)   10. Lumbar facet syndrome (Location of Secondary source of pain) (Bilateral) (L>R)    Plan of Care   Problem List Items  Addressed This Visit      High   Chronic knee pain (Location of Primary Source of Pain) (Bilateral) (L>R) (Chronic)   Chronic low back pain (Location of Secondary source of pain) (Bilateral) (L>R) (Chronic)   Relevant Medications   oxyCODONE (ROXICODONE) 15 MG immediate release tablet   Chronic lower extremity pain (Location of Tertiary source of pain) (Bilateral) (L>R) (Chronic)   Chronic lumbar radicular pain (L5 dermatome) (Location of Tertiary source of pain) (Left) (Chronic)   Chronic pain - Primary (Chronic)   Relevant Medications   oxyCODONE (ROXICODONE) 15 MG immediate release tablet   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) (L>R) (Chronic)   Relevant Medications   oxyCODONE (ROXICODONE) 15 MG immediate release tablet   Lumbar facet syndrome (Location of Secondary source of pain) (Bilateral) (L>R) (Chronic)   Relevant Medications   oxyCODONE (ROXICODONE) 15 MG immediate release tablet   Other Relevant Orders   LUMBAR FACET(MEDIAL BRANCH NERVE BLOCK) MBNB   Osteoarthritis of knee (Bilateral) (L>R) (Chronic)   Relevant Medications   oxyCODONE (ROXICODONE) 15 MG immediate release tablet     Medium   Long term current use of opiate analgesic (Chronic)   Opiate use (90 MME/Day) (Chronic)    Other Visit Diagnoses   None.    Pharmacotherapy (Medications Ordered): Meds ordered this encounter  Medications  . oxyCODONE (ROXICODONE) 15 MG immediate release tablet    Sig: Take 1 tablet (15 mg total) by mouth every 6 (six) hours as needed. for pain    Dispense:  30 tablet    Refill:  0    Do not add this medication to the electronic "Automatic Refill" notification system. Patient may have prescription filled one day early if pharmacy is closed on scheduled refill date. Do not fill until: 10/05/15 To last until: 11/04/15   New Prescriptions   No medications on file   Medications administered during this visit: Manuel Knight had no  medications administered during this visit. Lab-work, Procedure(s), & Referral(s) Ordered: Orders Placed This Encounter  Procedures  . LUMBAR FACET(MEDIAL BRANCH NERVE BLOCK) MBNB   Imaging & Referral(s) Ordered: None  Interventional Therapies: Scheduled:  Diagnostic Bilateral Lumbar Facet Block   Considering: Diagnostic bilateral intra-articular knee injection, without fluoroscopic guidance or IV sedation.  Diagnostic bilateral genicular nerve block under fluoroscopic guidance, with a without sedation.  Possible bilateral genicular nerve radiofrequency ablation under fluoroscopic guidance and IV sedation, the pending on the results of the diagnostic injection.  Diagnostic bilateral lumbar facet block under fluoroscopic guidance and IV sedation.  Possible bilateral lumbar facet radiofrequency ablation under fluoroscopic guidance and IV sedation depending on the results of the diagnostic injection.  Diagnostic left-sided L4-5  lumbar epidural steroid injection under fluoroscopic guidance, with or without IV sedation.  Diagnostic bilateral sacroiliac joint block under fluoroscopic guidance, with or without sedation.  Possible bilateral sacroiliac joint radiofrequency ablation under fluoroscopic guidance and IV sedation, the pending on the results of the diagnostic injection.    PRN Procedures: Diagnostic bilateral intra-articular knee injection, without fluoroscopic guidance or IV sedation.  Diagnostic bilateral genicular nerve block under fluoroscopic guidance, with a without sedation.  Diagnostic bilateral lumbar facet block under fluoroscopic guidance and IV sedation.  Diagnostic left-sided L4-5 lumbar epidural steroid injection under fluoroscopic guidance, with or without IV sedation.  Diagnostic bilateral sacroiliac joint block under fluoroscopic guidance, with or without sedation.    Requested PM Follow-up: Return in 4 weeks (on 10/30/2015) for Med-Mgmt, In addition,  Schedule Procedure, (ASAP).  Future Appointments Date Time Provider [place]  10/08/2015 9:15 AM Milinda Pointer, MD [facility] None  10/31/2015 1:45 PM Milinda Pointer, MD [facility] None    Primary Care Physician: [facility] Location: [facility] Note by: Kathlen Brunswick. Dossie Arbour, M.D, DABA, DABAPM, DABPM, DABIPP, FIPP  Pain Score Disclaimer: We use the NRS-11 scale. This is a self-reported, subjective measurement of pain severity with only modest accuracy. It is used primarily to identify changes within a particular patient. It must be understood that outpatient pain scales are significantly less accurate that those used for research, where they can be applied under ideal controlled circumstances with minimal exposure to variables. In reality, the score is likely to be a combination of pain intensity and pain affect, where pain affect describes the degree of emotional arousal or changes in action readiness caused by the sensory experience of pain. Factors such as social and work situation, setting, emotional state, anxiety levels, expectation, and prior pain experience may influence pain perception and show large inter-individual differences that may also be affected by time variables.  Patient instructions provided during this appointment: Patient Instructions   Facet Blocks Patient Information  Description: The facets are joints in the spine between the vertebrae.  Like any joints in the body, facets can become irritated and painful.  Arthritis can also effect the facets.  By injecting steroids and local anesthetic in and around these joints, we can temporarily block the nerve supply to them.  Steroids act directly on irritated nerves and tissues to reduce selling and inflammation which often leads to decreased pain.  Facet blocks may be done anywhere along the spine from the neck to the low back depending upon the location of  your pain.   After numbing the skin with local anesthetic (like Novocaine), a small needle is passed onto the facet joints under x-ray guidance.  You may experience a sensation of pressure while this is being done.  The entire block usually lasts about 15-25 minutes.   Conditions which may be treated by facet blocks:   Low back/buttock pain  Neck/shoulder pain  Certain types of headaches  Preparation for the injection:  1. Do not eat any solid food or dairy products within 8 hours of your appointment. 2. You may drink clear liquid up to 3 hours before appointment.  Clear liquids include water, black coffee, juice or soda.  No milk or cream please. 3. You may take your regular medication, including pain medications, with a sip of water before your appointment.  Diabetics should hold regular insulin (if taken separately) and take 1/2 normal NPH dose the morning of the procedure.  Carry some sugar  containing items with you to your appointment. 4. A driver must accompany you and be prepared to drive you home after your procedure. 5. Bring all your current medications with you. 6. An IV may be inserted and sedation may be given at the discretion of the physician. 7. A blood pressure cuff, EKG and other monitors will often be applied during the procedure.  Some patients may need to have extra oxygen administered for a short period. 8. You will be asked to provide medical information, including your allergies and medications, prior to the procedure.  We must know immediately if you are taking blood thinners (like Coumadin/Warfarin) or if you are allergic to IV iodine Knight (dye).  We must know if you could possible be pregnant.  Possible side-effects:   Bleeding from needle site  Infection (rare, may require surgery)  Nerve injury (rare)  Numbness & tingling (temporary)  Difficulty urinating (rare, temporary)  Spinal headache (a headache worse with upright posture)  Light-headedness  (temporary)  Pain at injection site (serveral days)  Decreased blood pressure (rare, temporary)  Weakness in arm/leg (temporary)  Pressure sensation in back/neck (temporary)   Call if you experience:   Fever/chills associated with headache or increased back/neck pain  Headache worsened by an upright position  New onset, weakness or numbness of an extremity below the injection site  Hives or difficulty breathing (go to the emergency room)  Inflammation or drainage at the injection site(s)  Severe back/neck pain greater than usual  New symptoms which are concerning to you  Please note:  Although the local anesthetic injected can often make your back or neck feel good for several hours after the injection, the pain will likely return. It takes 3-7 days for steroids to work.  You may not notice any pain relief for at least one week.  If effective, we will often do a series of 2-3 injections spaced 3-6 weeks apart to maximally decrease your pain.  After the initial series, you may be a candidate for a more permanent nerve block of the facets.  If you have any questions, please call #[phone] [place]  What are the risk, side effects and possible complications? Generally speaking, most procedures are safe.  However, with any procedure there are risks, side effects, and the possibility of complications.  The risks and complications are dependent upon the sites that are lesioned, or the type of nerve block to be performed.  The closer the procedure is to the spine, the more serious the risks are.  Great care is taken when placing the radio frequency needles, block needles or lesioning probes, but sometimes complications can occur. 1. Infection: Any time there is an injection through the skin, there is a risk of infection.  This is why sterile conditions are used for these blocks.  There are four possible types of  infection. 1. Localized skin infection. 2. Central Nervous System Infection-This can be in the form of Meningitis, which can be deadly. 3. Epidural Infections-This can be in the form of an epidural abscess, which can cause pressure inside of the spine, causing compression of the spinal cord with subsequent paralysis. This would require an emergency surgery to decompress, and there are no guarantees that the patient would recover from the paralysis. 4. Discitis-This is an infection of the intervertebral discs.  It occurs in about 1% of discography procedures.  It is difficult to treat and it may lead to surgery.  2. Pain: the needles have to go through skin and soft tissues, will cause soreness.       3. Damage to internal structures:  The nerves to be lesioned may be near blood vessels or    other nerves which can be potentially damaged.       4. Bleeding: Bleeding is more common if the patient is taking blood thinners such as  aspirin, Coumadin, Ticiid, Plavix, etc., or if he/she have some genetic predisposition  such as hemophilia. Bleeding into the spinal canal can cause compression of the spinal  cord with subsequent paralysis.  This would require an emergency surgery to  decompress and there are no guarantees that the patient would recover from the  paralysis.       5. Pneumothorax:  Puncturing of a lung is a possibility, every time a needle is introduced in  the area of the chest or upper back.  Pneumothorax refers to free air around the  collapsed lung(s), inside of the thoracic cavity (chest cavity).  Another two possible  complications related to a similar event would include: Hemothorax and Chylothorax.   These are variations of the Pneumothorax, where instead of air around the collapsed  lung(s), you may have blood or chyle, respectively.       6. Spinal headaches: They may occur with any procedures in the area of the spine.       7. Persistent CSF (Cerebro-Spinal Fluid) leakage: This is  a rare problem, but may occur  with prolonged intrathecal or epidural catheters either due to the formation of a fistulous  track or a dural tear.       8. Nerve damage: By working so close to the spinal cord, there is always a possibility of  nerve damage, which could be as serious as a permanent spinal cord injury with  paralysis.       9. Death:  Although rare, severe deadly allergic reactions known as "Anaphylactic  reaction" can occur to any of the medications used.      10. Worsening of the symptoms:  We can always make thing worse.  What are the chances of something like this happening? Chances of any of this occuring are extremely low.  By statistics, you have more of a chance of getting killed in a motor vehicle accident: while driving to the hospital than any of the above occurring .  Nevertheless, you should be aware that they are possibilities.  In general, it is similar to taking a shower.  Everybody knows that you can slip, hit your head and get killed.  Does that mean that you should not shower again?  Nevertheless always keep in mind that statistics do not mean anything if you happen to be on the wrong side of them.  Even if a procedure has a 1 (one) in a 1,000,000 (million) chance of going wrong, it you happen to be that one..Also, keep in mind that by statistics, you have more of a chance of having something go wrong when taking medications.  Who should not have this procedure? If you are on a blood thinning medication (e.g. Coumadin, Plavix, see list of "Blood Thinners"), or if you have an active infection going on, you should not have the procedure.  If you are taking any blood thinners, please inform your physician.  How should I prepare for this procedure?  Do not eat or drink anything at least six hours prior to the procedure.  Bring a driver with  you .  It cannot be a taxi.  Come accompanied by an adult that can drive you back, and that is strong enough to help you if your  legs get weak or numb from the local anesthetic.  Take all of your medicines the morning of the procedure with just enough water to swallow them.  If you have diabetes, make sure that you are scheduled to have your procedure done first thing in the morning, whenever possible.  If you have diabetes, take only half of your insulin dose and notify our nurse that you have done so as soon as you arrive at the clinic.  If you are diabetic, but only take blood sugar pills (oral hypoglycemic), then do not take them on the morning of your procedure.  You may take them after you have had the procedure.  Do not take aspirin or any aspirin-containing medications, at least eleven (11) days prior to the procedure.  They may prolong bleeding.  Wear loose fitting clothing that may be easy to take off and that you would not mind if it got stained with Betadine or blood.  Do not wear any jewelry or perfume  Remove any nail coloring.  It will interfere with some of our monitoring equipment.  NOTE: Remember that this is not meant to be interpreted as a complete list of all possible complications.  Unforeseen problems may occur.  BLOOD THINNERS The following drugs contain aspirin or other products, which can cause increased bleeding during surgery and should not be taken for 2 weeks prior to and 1 week after surgery.  If you should need take something for relief of minor pain, you may take acetaminophen which is found in Tylenol,m Datril, Anacin-3 and Panadol. It is not blood thinner. The products listed below are.  Do not take any of the products listed below in addition to any listed on your instruction sheet.  A.P.C or A.P.C with Codeine Codeine Phosphate Capsules #3 Ibuprofen Ridaura  ABC compound Congesprin Imuran rimadil  Advil Cope Indocin Robaxisal  Alka-Seltzer Effervescent Pain Reliever and Antacid Coricidin or Coricidin-D  Indomethacin Rufen  Alka-Seltzer plus Cold Medicine Cosprin Ketoprofen S-A-C  Tablets  Anacin Analgesic Tablets or Capsules Coumadin Korlgesic Salflex  Anacin Extra Strength Analgesic tablets or capsules CP-2 Tablets Lanoril Salicylate  Anaprox Cuprimine Capsules Levenox Salocol  Anexsia-D Dalteparin Magan Salsalate  Anodynos Darvon compound Magnesium Salicylate Sine-off  Ansaid Dasin Capsules Magsal Sodium Salicylate  Anturane Depen Capsules Marnal Soma  APF Arthritis pain formula Dewitt's Pills Measurin Stanback  Argesic Dia-Gesic Meclofenamic Sulfinpyrazone  Arthritis Bayer Timed Release Aspirin Diclofenac Meclomen Sulindac  Arthritis pain formula Anacin Dicumarol Medipren Supac  Analgesic (Safety coated) Arthralgen Diffunasal Mefanamic Suprofen  Arthritis Strength Bufferin Dihydrocodeine Mepro Compound Suprol  Arthropan liquid Dopirydamole Methcarbomol with Aspirin Synalgos  ASA tablets/Enseals Disalcid Micrainin Tagament  Ascriptin Doan's Midol Talwin  Ascriptin A/D Dolene Mobidin Tanderil  Ascriptin Extra Strength Dolobid Moblgesic Ticlid  Ascriptin with Codeine Doloprin or Doloprin with Codeine Momentum Tolectin  Asperbuf Duoprin Mono-gesic Trendar  Aspergum Duradyne Motrin or Motrin IB Triminicin  Aspirin plain, buffered or enteric coated Durasal Myochrisine Trigesic  Aspirin Suppositories Easprin Nalfon Trillsate  Aspirin with Codeine Ecotrin Regular or Extra Strength Naprosyn Uracel  Atromid-S Efficin Naproxen Ursinus  Auranofin Capsules Elmiron Neocylate Vanquish  Axotal Emagrin Norgesic Verin  Azathioprine Empirin or Empirin with Codeine Normiflo Vitamin E  Azolid Emprazil Nuprin Voltaren  Bayer Aspirin plain, buffered or children's or timed BC Tablets or powders Encaprin  Orgaran Warfarin Sodium  Buff-a-Comp Enoxaparin Orudis Zorpin  Buff-a-Comp with Codeine Equegesic Os-Cal-Gesic   Buffaprin Excedrin plain, buffered or Extra Strength Oxalid   Bufferin Arthritis Strength Feldene Oxphenbutazone   Bufferin plain or Extra Strength Feldene Capsules  Oxycodone with Aspirin   Bufferin with Codeine Fenoprofen Fenoprofen Pabalate or Pabalate-SF   Buffets II Flogesic Panagesic   Buffinol plain or Extra Strength Florinal or Florinal with Codeine Panwarfarin   Buf-Tabs Flurbiprofen Penicillamine   Butalbital Compound Four-way cold tablets Penicillin   Butazolidin Fragmin Pepto-Bismol   Carbenicillin Geminisyn Percodan   Carna Arthritis Reliever Geopen Persantine   Carprofen Gold's salt Persistin   Chloramphenicol Goody's Phenylbutazone   Chloromycetin Haltrain Piroxlcam   Clmetidine heparin Plaquenil   Cllnoril Hyco-pap Ponstel   Clofibrate Hydroxy chloroquine Propoxyphen         Before stopping any of these medications, be sure to consult the physician who ordered them.  Some, such as Coumadin (Warfarin) are ordered to prevent or treat serious conditions such as "deep thrombosis", "pumonary embolisms", and other heart problems.  The amount of time that you may need off of the medication may also vary with the medication and the reason for which you were taking it.  If you are taking any of these medications, please make sure you notify your pain physician before you undergo any procedures.         Beaulah Corin

## 2015-10-02 NOTE — Patient Instructions (Addendum)

## 2015-10-05 ENCOUNTER — Other Ambulatory Visit: Payer: Self-pay | Admitting: Cardiovascular Disease

## 2015-10-07 MED ORDER — SACUBITRIL-VALSARTAN 97-103 MG PO TABS: 1.0000 | ORAL_TABLET | Freq: Two times a day (BID) | ORAL | 1 refills | Status: AC

## 2015-10-08 ENCOUNTER — Ambulatory Visit
Admission: RE | Admit: 2015-10-08 | Discharge: 2015-10-08 | Disposition: A | Discharge status: Home / Self Care | Payer: Medicare Other | Source: Ambulatory Visit | Attending: Pain Medicine | Admitting: Pain Medicine

## 2015-10-08 ENCOUNTER — Encounter: Payer: Self-pay | Admitting: Pain Medicine

## 2015-10-08 ENCOUNTER — Ambulatory Visit (HOSPITAL_BASED_OUTPATIENT_CLINIC_OR_DEPARTMENT_OTHER): Payer: Medicare Other | Admitting: Pain Medicine

## 2015-10-08 VITALS — BP 133/68 | HR 76 | Temp 97.0°F | Resp 17 | Ht 69.0 in | Wt 242.0 lb

## 2015-10-08 DIAGNOSIS — I5022 Chronic systolic (congestive) heart failure: Secondary | ICD-10-CM | POA: Insufficient documentation

## 2015-10-08 DIAGNOSIS — E1142 Type 2 diabetes mellitus with diabetic polyneuropathy: Secondary | ICD-10-CM | POA: Diagnosis not present

## 2015-10-08 DIAGNOSIS — K219 Gastro-esophageal reflux disease without esophagitis: Secondary | ICD-10-CM | POA: Diagnosis not present

## 2015-10-08 DIAGNOSIS — I2510 Atherosclerotic heart disease of native coronary artery without angina pectoris: Secondary | ICD-10-CM | POA: Insufficient documentation

## 2015-10-08 DIAGNOSIS — Z5181 Encounter for therapeutic drug level monitoring: Secondary | ICD-10-CM | POA: Insufficient documentation

## 2015-10-08 DIAGNOSIS — E785 Hyperlipidemia, unspecified: Secondary | ICD-10-CM | POA: Diagnosis not present

## 2015-10-08 DIAGNOSIS — I110 Hypertensive heart disease with heart failure: Secondary | ICD-10-CM | POA: Insufficient documentation

## 2015-10-08 DIAGNOSIS — E669 Obesity, unspecified: Secondary | ICD-10-CM | POA: Insufficient documentation

## 2015-10-08 DIAGNOSIS — E1165 Type 2 diabetes mellitus with hyperglycemia: Secondary | ICD-10-CM | POA: Diagnosis not present

## 2015-10-08 DIAGNOSIS — M47816 Spondylosis without myelopathy or radiculopathy, lumbar region: Secondary | ICD-10-CM | POA: Diagnosis not present

## 2015-10-08 DIAGNOSIS — Z951 Presence of aortocoronary bypass graft: Secondary | ICD-10-CM | POA: Diagnosis not present

## 2015-10-08 DIAGNOSIS — J449 Chronic obstructive pulmonary disease, unspecified: Secondary | ICD-10-CM | POA: Insufficient documentation

## 2015-10-08 DIAGNOSIS — M545 Low back pain: Secondary | ICD-10-CM | POA: Diagnosis not present

## 2015-10-08 DIAGNOSIS — Z794 Long term (current) use of insulin: Secondary | ICD-10-CM | POA: Diagnosis not present

## 2015-10-08 DIAGNOSIS — Z79891 Long term (current) use of opiate analgesic: Secondary | ICD-10-CM | POA: Insufficient documentation

## 2015-10-08 DIAGNOSIS — G8929 Other chronic pain: Secondary | ICD-10-CM

## 2015-10-08 DIAGNOSIS — M4726 Other spondylosis with radiculopathy, lumbar region: Secondary | ICD-10-CM | POA: Diagnosis not present

## 2015-10-08 DIAGNOSIS — M1288 Other specific arthropathies, not elsewhere classified, other specified site: Secondary | ICD-10-CM | POA: Insufficient documentation

## 2015-10-08 DIAGNOSIS — R911 Solitary pulmonary nodule: Secondary | ICD-10-CM | POA: Diagnosis not present

## 2015-10-08 MED ORDER — ROPIVACAINE HCL 2 MG/ML IJ SOLN: 9.0000 mL | Freq: Once | INTRAMUSCULAR | Status: DC

## 2015-10-08 MED ORDER — OXYCODONE HCL 15 MG PO TABS: 15.0000 mg | ORAL_TABLET | Freq: Four times a day (QID) | ORAL | 0 refills | Status: DC | PRN

## 2015-10-08 MED ORDER — LACTATED RINGERS IV SOLN: 1000.0000 mL | Freq: Once | INTRAVENOUS | Status: DC

## 2015-10-08 MED ORDER — MIDAZOLAM HCL 5 MG/5ML IJ SOLN: 1.0000 mg | INTRAMUSCULAR | Status: DC | PRN

## 2015-10-08 MED ORDER — FENTANYL CITRATE (PF) 100 MCG/2ML IJ SOLN
INTRAMUSCULAR | Status: AC
  Administered 2015-10-08: 50 ug via INTRAVENOUS
  Filled 2015-10-08: qty 2

## 2015-10-08 MED ORDER — FENTANYL CITRATE (PF) 100 MCG/2ML IJ SOLN: 25.0000 ug | INTRAMUSCULAR | Status: DC | PRN

## 2015-10-08 MED ORDER — TRIAMCINOLONE ACETONIDE 40 MG/ML IJ SUSP: 40.0000 mg | Freq: Once | INTRAMUSCULAR | Status: DC

## 2015-10-08 MED ORDER — LIDOCAINE HCL (PF) 1 % IJ SOLN: 10.0000 mL | Freq: Once | INTRAMUSCULAR | Status: DC

## 2015-10-08 MED ORDER — ROPIVACAINE HCL 2 MG/ML IJ SOLN
INTRAMUSCULAR | Status: AC
  Administered 2015-10-08: 10:00:00
  Filled 2015-10-08: qty 20

## 2015-10-08 MED ORDER — TRIAMCINOLONE ACETONIDE 40 MG/ML IJ SUSP
INTRAMUSCULAR | Status: AC
  Administered 2015-10-08: 10:00:00
  Filled 2015-10-08: qty 2

## 2015-10-08 MED ORDER — MIDAZOLAM HCL 5 MG/5ML IJ SOLN
INTRAMUSCULAR | Status: AC
  Administered 2015-10-08: 2 mg via INTRAVENOUS
  Filled 2015-10-08: qty 5

## 2015-10-08 NOTE — Progress Notes (Signed)
Patient's Name: Manuel Knight  MRN: [id number]  Referring Provider: [facility]  DOB: January 22, 1960  PCP: [facility]  DOS: 10/08/2015  Note by: Kathlen Brunswick. Dossie Arbour, MD  Service setting: Ambulatory outpatient  Location: [facility] (AMB) Pain Management Facility  Visit type: Procedure  Specialty: Interventional Pain Management  Patient type: Established   Primary Reason for Visit: Interventional Pain Management Treatment. CC: Back Pain (lower)  Procedure:  Anesthesia, Analgesia, Anxiolysis:  Type: Diagnostic Medial Branch Facet Block Region: Lumbar Level: L2, L3, L4, L5, & S1 Medial Branch Level(s) Laterality: Bilateral    Type: Moderate (Conscious) Sedation & Local Anesthesia Local Anesthetic: Lidocaine 1% Route: Intravenous (IV) IV Access: Secured Sedation: Meaningful verbal contact was maintained at all times during the procedure  Indication(s): Analgesia & Anxiolysis   Indications: 1. Lumbar facet syndrome (Location of Secondary source of pain) (Bilateral) (L>R)   2. Chronic pain   3. Chronic low back pain (Location of Secondary source of pain) (Bilateral) (L>R)   4. Lumbar spondylosis, unspecified spinal osteoarthritis     Pain Score: Pre-procedure: 6 /10 Post-procedure: 0-No pain/10  Pre-Procedure Assessment:  Manuel Knight is a 55 y.o. year old, male patient, seen today for interventional treatment. He has Type 2 diabetes mellitus with hyperglycemia, with long-term current use of insulin ([place]); HLD (hyperlipidemia); Obesity, unspecified; Anxiety state; Essential hypertension; COPD; PULMONARY NODULE; HERNIA; GERD (gastroesophageal reflux disease); Esophageal dysphagia; Chronic diarrhea; Accelerated hypertension; NSTEMI (non-ST elevated myocardial infarction) ([place]); CAD (coronary artery disease), native coronary artery; S/P CABG x 3; Chronic systolic heart failure ([place]); Emphysema of lung ([place]); Disorder of joint of spine; H/O coronary artery bypass  surgery; Chronic pain; Encounter for pain management planning; Encounter for therapeutic drug level monitoring; Opiate use (90 MME/Day); Chronic low back pain (Location of Secondary source of pain) (Bilateral) (L>R); Chronic knee pain (Location of Primary Source of Pain) (Bilateral) (L>R); Chronic foot pain (Bilateral) (L>R); Disturbance of skin sensation; Diabetic peripheral neuropathy (HCC) (lower extremity); Chronic lumbar radicular pain (L5 dermatome) (Location of Tertiary source of pain) (Left); 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) (L>R); 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 His primarily concern today is the Back Pain (lower)   Pain Type: Chronic pain Pain Location: Back Pain Orientation: Lower, Left Pain Descriptors / Indicators: Shooting, Throbbing Pain Frequency: Constant  Date of Last Visit: 10/02/15 Service Provided on Last Visit: Med Refill  Coagulation Parameters Lab Results  Component Value Date   INR 1.59 (H) 02/06/2013   LABPROT 18.5 (H) 02/06/2013   APTT 40 (H) 02/06/2013   PLT 307 03/15/2015    Verification of the correct person, correct site (including marking of site), and correct procedure were performed and confirmed by the patient.  Consent: Secured. Under the influence of no sedatives a written informed consent was obtained, after having provided information on the risks and possible complications. To fulfill our ethical and legal obligations, as recommended by the American Medical Association's Code of Ethics, we have provided information to the  patient about our clinical impression; the nature and purpose of the treatment or procedure; the risks, benefits, and possible complications of the intervention; alternatives; the risk(s) and  benefit(s) of the alternative treatment(s) or procedure(s); and the risk(s) and benefit(s) of doing nothing. The patient was provided information about the risks and possible complications associated with the procedure. These include, but are not limited to, failure to achieve desired goals, infection, bleeding, organ or nerve damage, allergic reactions, paralysis, and death. In the case of spinal procedures these may include, but are not limited to, failure to achieve desired goals, infection, bleeding, organ or nerve damage, allergic reactions, paralysis, and death. In addition, the patient was informed that Medicine is not an exact science; therefore, there is also the possibility of unforeseen risks and possible complications that may result in a catastrophic outcome. The patient indicated having understood very clearly. We have given the patient no guarantees and we have made no promises. Enough time was given to the patient to ask questions, all of which were answered to the patient's satisfaction.  Consent Attestation: I, the ordering provider, attest that I have discussed with the patient the benefits, risks, side-effects, alternatives, likelihood of achieving goals, and potential problems during recovery for the procedure that I have provided informed consent.  Pre-Procedure Preparation: Safety Precautions: Allergies reviewed. Appropriate site, procedure, and patient were confirmed by following the Joint Commission's Universal Protocol (UP.01.01.01), in the form of a "Time Out". The patient was asked to confirm marked site and procedure, before commencing. The patient was asked about blood thinners, or active infections, both of which were denied. Patient was assessed for positional comfort and all pressure  points were checked before starting procedure. Infection Control Precautions: Sterile technique used. Standard Universal Precautions were taken as recommended by the Department of [facility] for Disease Control and Prevention (CDC). Standard pre-surgical skin prep was conducted. Respiratory hygiene and cough etiquette was practiced. Hand hygiene observed. Safe injection practices and needle disposal techniques followed. SDV (single dose vial) medications used. Medications properly checked for expiration dates and contaminants. Personal protective equipment (PPE) used: Surgical mask. Sterile Radiation-resistant gloves. Monitoring:  As per clinic protocol. Vitals:   10/08/15 1024 10/08/15 1034 10/08/15 1044 10/08/15 1054  BP: 134/81 (!) 141/73 (!) 143/71 133/68  Pulse: 84 77 75 76  Resp: 12 17    Temp: 98 F (36.7 C)   97 F (36.1 C)  TempSrc:      SpO2: 98% 98% 96% 97%  Weight:      Height:      Calculated BMI: Body mass index is 35.74 kg/m. Allergies: He is allergic to lodine [etodolac]; tylenol [acetaminophen]; iodine; and propoxyphene n-acetaminophen.. Allergy Precautions: None required  Description of Procedure Process:   Time-out: "Time-out" completed before starting procedure, as per protocol. Position: Prone Target Area: For Lumbar Facet blocks, the target is the groove formed by the junction of the transverse process and superior articular process. For the L5 dorsal ramus, the target is the notch between superior articular process and sacral ala. For the S1 dorsal ramus, the target is the superior and lateral edge of the posterior S1 Sacral foramen. Approach: Paramedial approach. Area Prepped: Entire Posterior Lumbosacral Region Prepping solution: ChloraPrep (2% chlorhexidine gluconate and 70% isopropyl alcohol) Safety Precautions: Aspiration looking for blood return was conducted prior to all injections. At no point did we inject any substances, as a needle was being  advanced. No attempts were made at seeking any paresthesias. Safe injection practices and needle disposal techniques used. Medications properly checked for expiration dates. SDV (single dose vial) medications used. Description of the Procedure: Protocol guidelines were followed. The patient was  placed in position over the fluoroscopy table. The target area was identified and the area prepped in the usual manner. Skin desensitized using vapocoolant spray. Skin & deeper tissues infiltrated with local anesthetic. Appropriate amount of time allowed to pass for local anesthetics to take effect. The procedure needle was introduced through the skin, ipsilateral to the reported pain, and advanced to the target area. Employing the "Medial Branch Technique", the needles were advanced to the angle made by the superior and medial portion of the transverse process, and the lateral and inferior portion of the superior articulating process of the targeted vertebral bodies. This area is known as "Burton's Eye" or the "Eye of the [place] Dog". A procedure needle was introduced through the skin, and this time advanced to the angle made by the superior and medial border of the sacral ala, and the lateral border of the S1 vertebral body. This last needle was later repositioned at the superior and lateral border of the posterior S1 foramen. Negative aspiration confirmed. Solution injected in intermittent fashion, asking for systemic symptoms every 0.5cc of injectate. The needles were then removed and the area cleansed, making sure to leave some of the prepping solution back to take advantage of its long term bactericidal properties. EBL: None Materials & Medications Used:  Needle(s) Used: 22g - 3.5" Spinal Needle(s)  Imaging Guidance:   Type of Imaging Technique: Fluoroscopy Guidance (Spinal) Indication(s): Assistance in needle guidance and placement for procedures requiring needle placement in or near specific anatomical  locations not easily accessible without such assistance. Exposure Time: Please see nurses notes. Contrast: None required. Fluoroscopic Guidance: I was personally present in the fluoroscopy suite, where the patient was placed in position for the procedure, over the fluoroscopy-compatible table. Fluoroscopy was manipulated, using "Tunnel Vision Technique", to obtain the best possible view of the target area, on the affected side. Parallax error was corrected before commencing the procedure. A "direction-depth-direction" technique was used to introduce the needle under continuous pulsed fluoroscopic guidance. Once the target was reached, antero-posterior, oblique, and lateral fluoroscopic projection views were taken to confirm needle placement in all planes. Permanently recorded images stored by scanning into EMR. Interpretation: Intraoperative imaging interpretation by performing Physician. Adequate needle placement confirmed. Adequate needle placement confirmed in AP, lateral, & Oblique Views. No contrast injected.  Antibiotic Prophylaxis:  Indication(s): No indications identified. Type:  Antibiotics Given (last 72 hours)    None       Post-operative Assessment:   Complications: No immediate post-treatment complications were observed. Disposition: Return to clinic for follow-up evaluation. The patient tolerated the entire procedure well. A repeat set of vitals were taken after the procedure and the patient was kept under observation following institutional policy, for this procedure. Post-procedural neurological assessment was performed, showing return to baseline, prior to discharge. The patient was discharged home, once institutional criteria were met. The patient was provided with post-procedure discharge instructions, including a section on how to identify potential problems. Should any problems arise concerning this procedure, the patient was given instructions to immediately contact us, at any  time, without hesitation. In any case, we plan to contact the patient by telephone for a follow-up status report regarding this interventional procedure. Comments:  No additional relevant information.  Plan of Care   Problem List Items Addressed This Visit      High   Chronic low back pain (Location of Secondary source of pain) (Bilateral) (L>R) (Chronic)   Relevant Medications   oxyCODONE (ROXICODONE) 15 MG immediate release tablet (  Start on 10/12/2015)   fentaNYL (SUBLIMAZE) injection 25-50 mcg   triamcinolone acetonide (KENALOG-40) injection 40 mg   triamcinolone acetonide (KENALOG-40) 40 MG/ML injection (Completed)   fentaNYL (SUBLIMAZE) 100 MCG/2ML injection (Completed)   Chronic pain (Chronic)   Relevant Medications   oxyCODONE (ROXICODONE) 15 MG immediate release tablet (Start on 10/12/2015)   fentaNYL (SUBLIMAZE) injection 25-50 mcg   triamcinolone acetonide (KENALOG-40) injection 40 mg   lidocaine (PF) (XYLOCAINE) 1 % injection 10 mL   ropivacaine (PF) 2 mg/ml (0.2%) (NAROPIN) epidural 9 mL   ropivacaine (PF) 2 mg/ml (0.2%) (NAROPIN) 2 MG/ML epidural (Completed)   triamcinolone acetonide (KENALOG-40) 40 MG/ML injection (Completed)   fentaNYL (SUBLIMAZE) 100 MCG/2ML injection (Completed)   Lumbar facet syndrome (Location of Secondary source of pain) (Bilateral) (L>R) - Primary (Chronic)   Relevant Medications   oxyCODONE (ROXICODONE) 15 MG immediate release tablet (Start on 10/12/2015)   fentaNYL (SUBLIMAZE) injection 25-50 mcg   lactated ringers infusion 1,000 mL   midazolam (VERSED) 5 MG/5ML injection 1-2 mg   triamcinolone acetonide (KENALOG-40) injection 40 mg   lidocaine (PF) (XYLOCAINE) 1 % injection 10 mL   ropivacaine (PF) 2 mg/ml (0.2%) (NAROPIN) epidural 9 mL   triamcinolone acetonide (KENALOG-40) 40 MG/ML injection (Completed)   fentaNYL (SUBLIMAZE) 100 MCG/2ML injection (Completed)   Other Relevant Orders   DG C-Arm 1-60 Min-No Report (Completed)   LUMBAR  FACET(MEDIAL BRANCH NERVE BLOCK) MBNB   Lumbar spondylosis (Chronic)   Relevant Medications   oxyCODONE (ROXICODONE) 15 MG immediate release tablet (Start on 10/12/2015)   fentaNYL (SUBLIMAZE) injection 25-50 mcg   triamcinolone acetonide (KENALOG-40) injection 40 mg   triamcinolone acetonide (KENALOG-40) 40 MG/ML injection (Completed)   fentaNYL (SUBLIMAZE) 100 MCG/2ML injection (Completed)    Other Visit Diagnoses   None.     Requested PM Follow-up: Return in about 2 weeks (around 10/22/2015) for Post-Procedure evaluation.  Future Appointments Date Time Provider [place]  10/31/2015 1:45 PM Milinda Pointer, MD [facility] None    Primary Care Physician: [facility] Location: [facility] Outpatient Pain Management Facility Note by: Kathlen Brunswick. Dossie Arbour, M.D, DABA, DABAPM, DABPM, DABIPP, FIPP   Illustration of the posterior view of the lumbar spine and the posterior neural structures. Laminae of L2 through S1 are labeled. DPRL5, dorsal primary ramus of L5; DPRS1, dorsal primary ramus of S1; DPR3, dorsal primary ramus of L3; FJ, facet (zygapophyseal) joint L3-L4; I, inferior articular process of L4; LB1, lateral branch of dorsal primary ramus of L1; IAB, inferior articular branches from L3 medial branch (supplies L4-L5 facet joint); IBP, intermediate branch plexus; MB3, medial branch of dorsal primary ramus of L3; NR3, third lumbar nerve root; S, superior articular process of L5; SAB, superior articular branches from L4 (supplies L4-5 facet joint also); TP3, transverse process of L3.  Disclaimer:  Medicine is not an Chief Strategy Officer. The only guarantee in medicine is that nothing is guaranteed. It is important to note that the decision to proceed with this intervention was based on the information collected from the patient. The Data and conclusions were drawn from the patient's questionnaire, the interview, and the physical examination. Because the information was  provided in large part by the patient, it cannot be guaranteed that it has not been purposely or unconsciously manipulated. Every effort has been made to obtain as much relevant data as possible for this evaluation. It is important to note that the conclusions that lead to this procedure are derived in large part from the  available data. Always take into account that the treatment will also be dependent on availability of resources and existing treatment guidelines, considered by other Pain Management Practitioners as being common knowledge and practice, at the time of the intervention. For Medico-Legal purposes, it is also important to point out that variation in procedural techniques and pharmacological choices are the acceptable norm. The indications, contraindications, technique, and results of the above procedure should only be interpreted and judged by a Board-Certified Interventional Pain Specialist with extensive familiarity and expertise in the same exact procedure and technique. Attempts at providing opinions without similar or greater experience and expertise than that of the treating physician will be considered as inappropriate and unethical, and shall result in a formal complaint to the state medical board and applicable specialty societies.

## 2015-10-08 NOTE — Progress Notes (Signed)
Safety precautions to be maintained throughout the outpatient stay will include: orient to surroundings, keep bed in low position, maintain call bell within reach at all times, provide assistance with transfer out of bed and ambulation.  

## 2015-10-08 NOTE — Patient Instructions (Signed)
Pain Management Discharge Instructions  General Discharge Instructions :  If you need to reach your doctor call: Monday-Friday 8:00 am - 4:00 pm at [phone] or toll free [phone].  After clinic hours [phone] to have operator reach doctor.  Bring all of your medication bottles to all your appointments in the pain clinic.  To cancel or reschedule your appointment with Pain Management please remember to call 24 hours in advance to avoid a fee.  Refer to the educational materials which you have been given on: General Risks, I had my Procedure. Discharge Instructions, Post Sedation.  Post Procedure Instructions:  The drugs you were given will stay in your system until tomorrow, so for the next 24 hours you should not drive, make any legal decisions or drink any alcoholic beverages.  You may eat anything you prefer, but it is better to start with liquids then soups and crackers, and gradually work up to solid foods.  Please notify your doctor immediately if you have any unusual bleeding, trouble breathing or pain that is not related to your normal pain.  Depending on the type of procedure that was done, some parts of your body may feel week and/or numb.  This usually clears up by tonight or the next day.  Walk with the use of an assistive device or accompanied by an adult for the 24 hours.  You may use ice on the affected area for the first 24 hours.  Put ice in a Ziploc bag and cover with a towel and place against area 15 minutes on 15 minutes off.  You may switch to heat after 24 hours.GENERAL RISKS AND COMPLICATIONS  What are the risk, side effects and possible complications? Generally speaking, most procedures are safe.  However, with any procedure there are risks, side effects, and the possibility of complications.  The risks and complications are dependent upon the sites that are lesioned, or the type of nerve block to be performed.  The closer the procedure is to the spine,  the more serious the risks are.  Great care is taken when placing the radio frequency needles, block needles or lesioning probes, but sometimes complications can occur. 1. Infection: Any time there is an injection through the skin, there is a risk of infection.  This is why sterile conditions are used for these blocks.  There are four possible types of infection. 1. Localized skin infection. 2. Central Nervous System Infection-This can be in the form of Meningitis, which can be deadly. 3. Epidural Infections-This can be in the form of an epidural abscess, which can cause pressure inside of the spine, causing compression of the spinal cord with subsequent paralysis. This would require an emergency surgery to decompress, and there are no guarantees that the patient would recover from the paralysis. 4. Discitis-This is an infection of the intervertebral discs.  It occurs in about 1% of discography procedures.  It is difficult to treat and it may lead to surgery.        2. Pain: the needles have to go through skin and soft tissues, will cause soreness.       3. Damage to internal structures:  The nerves to be lesioned may be near blood vessels or    other nerves which can be potentially damaged.       4. Bleeding: Bleeding is more common if the patient is taking blood thinners such as  aspirin, Coumadin, Ticiid, Plavix, etc., or if he/she have some genetic predisposition  such as   hemophilia. Bleeding into the spinal canal can cause compression of the spinal  cord with subsequent paralysis.  This would require an emergency surgery to  decompress and there are no guarantees that the patient would recover from the  paralysis.       5. Pneumothorax:  Puncturing of a lung is a possibility, every time a needle is introduced in  the area of the chest or upper back.  Pneumothorax refers to free air around the  collapsed lung(s), inside of the thoracic cavity (chest cavity).  Another two possible  complications  related to a similar event would include: Hemothorax and Chylothorax.   These are variations of the Pneumothorax, where instead of air around the collapsed  lung(s), you may have blood or chyle, respectively.       6. Spinal headaches: They may occur with any procedures in the area of the spine.       7. Persistent CSF (Cerebro-Spinal Fluid) leakage: This is a rare problem, but may occur  with prolonged intrathecal or epidural catheters either due to the formation of a fistulous  track or a dural tear.       8. Nerve damage: By working so close to the spinal cord, there is always a possibility of  nerve damage, which could be as serious as a permanent spinal cord injury with  paralysis.       9. Death:  Although rare, severe deadly allergic reactions known as "Anaphylactic  reaction" can occur to any of the medications used.      10. Worsening of the symptoms:  We can always make thing worse.  What are the chances of something like this happening? Chances of any of this occuring are extremely low.  By statistics, you have more of a chance of getting killed in a motor vehicle accident: while driving to the hospital than any of the above occurring .  Nevertheless, you should be aware that they are possibilities.  In general, it is similar to taking a shower.  Everybody knows that you can slip, hit your head and get killed.  Does that mean that you should not shower again?  Nevertheless always keep in mind that statistics do not mean anything if you happen to be on the wrong side of them.  Even if a procedure has a 1 (one) in a 1,000,000 (million) chance of going wrong, it you happen to be that one..Also, keep in mind that by statistics, you have more of a chance of having something go wrong when taking medications.  Who should not have this procedure? If you are on a blood thinning medication (e.g. Coumadin, Plavix, see list of "Blood Thinners"), or if you have an active infection going on, you should not  have the procedure.  If you are taking any blood thinners, please inform your physician.  How should I prepare for this procedure?  Do not eat or drink anything at least six hours prior to the procedure.  Bring a driver with you .  It cannot be a taxi.  Come accompanied by an adult that can drive you back, and that is strong enough to help you if your legs get weak or numb from the local anesthetic.  Take all of your medicines the morning of the procedure with just enough water to swallow them.  If you have diabetes, make sure that you are scheduled to have your procedure done first thing in the morning, whenever possible.  If you have diabetes,   take only half of your insulin dose and notify our nurse that you have done so as soon as you arrive at the clinic.  If you are diabetic, but only take blood sugar pills (oral hypoglycemic), then do not take them on the morning of your procedure.  You may take them after you have had the procedure.  Do not take aspirin or any aspirin-containing medications, at least eleven (11) days prior to the procedure.  They may prolong bleeding.  Wear loose fitting clothing that may be easy to take off and that you would not mind if it got stained with Betadine or blood.  Do not wear any jewelry or perfume  Remove any nail coloring.  It will interfere with some of our monitoring equipment.  NOTE: Remember that this is not meant to be interpreted as a complete list of all possible complications.  Unforeseen problems may occur.  BLOOD THINNERS The following drugs contain aspirin or other products, which can cause increased bleeding during surgery and should not be taken for 2 weeks prior to and 1 week after surgery.  If you should need take something for relief of minor pain, you may take acetaminophen which is found in Tylenol,m Datril, Anacin-3 and Panadol. It is not blood thinner. The products listed below are.  Do not take any of the products listed below  in addition to any listed on your instruction sheet.  A.P.C or A.P.C with Codeine Codeine Phosphate Capsules #3 Ibuprofen Ridaura  ABC compound Congesprin Imuran rimadil  Advil Cope Indocin Robaxisal  Alka-Seltzer Effervescent Pain Reliever and Antacid Coricidin or Coricidin-D  Indomethacin Rufen  Alka-Seltzer plus Cold Medicine Cosprin Ketoprofen S-A-C Tablets  Anacin Analgesic Tablets or Capsules Coumadin Korlgesic Salflex  Anacin Extra Strength Analgesic tablets or capsules CP-2 Tablets Lanoril Salicylate  Anaprox Cuprimine Capsules Levenox Salocol  Anexsia-D Dalteparin Magan Salsalate  Anodynos Darvon compound Magnesium Salicylate Sine-off  Ansaid Dasin Capsules Magsal Sodium Salicylate  Anturane Depen Capsules Marnal Soma  APF Arthritis pain formula Dewitt's Pills Measurin Stanback  Argesic Dia-Gesic Meclofenamic Sulfinpyrazone  Arthritis Bayer Timed Release Aspirin Diclofenac Meclomen Sulindac  Arthritis pain formula Anacin Dicumarol Medipren Supac  Analgesic (Safety coated) Arthralgen Diffunasal Mefanamic Suprofen  Arthritis Strength Bufferin Dihydrocodeine Mepro Compound Suprol  Arthropan liquid Dopirydamole Methcarbomol with Aspirin Synalgos  ASA tablets/Enseals Disalcid Micrainin Tagament  Ascriptin Doan's Midol Talwin  Ascriptin A/D Dolene Mobidin Tanderil  Ascriptin Extra Strength Dolobid Moblgesic Ticlid  Ascriptin with Codeine Doloprin or Doloprin with Codeine Momentum Tolectin  Asperbuf Duoprin Mono-gesic Trendar  Aspergum Duradyne Motrin or Motrin IB Triminicin  Aspirin plain, buffered or enteric coated Durasal Myochrisine Trigesic  Aspirin Suppositories Easprin Nalfon Trillsate  Aspirin with Codeine Ecotrin Regular or Extra Strength Naprosyn Uracel  Atromid-S Efficin Naproxen Ursinus  Auranofin Capsules Elmiron Neocylate Vanquish  Axotal Emagrin Norgesic Verin  Azathioprine Empirin or Empirin with Codeine Normiflo Vitamin E  Azolid Emprazil Nuprin Voltaren  Bayer  Aspirin plain, buffered or children's or timed BC Tablets or powders Encaprin Orgaran Warfarin Sodium  Buff-a-Comp Enoxaparin Orudis Zorpin  Buff-a-Comp with Codeine Equegesic Os-Cal-Gesic   Buffaprin Excedrin plain, buffered or Extra Strength Oxalid   Bufferin Arthritis Strength Feldene Oxphenbutazone   Bufferin plain or Extra Strength Feldene Capsules Oxycodone with Aspirin   Bufferin with Codeine Fenoprofen Fenoprofen Pabalate or Pabalate-SF   Buffets II Flogesic Panagesic   Buffinol plain or Extra Strength Florinal or Florinal with Codeine Panwarfarin   Buf-Tabs Flurbiprofen Penicillamine   Butalbital Compound Four-way cold tablets   Penicillin   Butazolidin Fragmin Pepto-Bismol   Carbenicillin Geminisyn Percodan   Carna Arthritis Reliever Geopen Persantine   Carprofen Gold's salt Persistin   Chloramphenicol Goody's Phenylbutazone   Chloromycetin Haltrain Piroxlcam   Clmetidine heparin Plaquenil   Cllnoril Hyco-pap Ponstel   Clofibrate Hydroxy chloroquine Propoxyphen         Before stopping any of these medications, be sure to consult the physician who ordered them.  Some, such as Coumadin (Warfarin) are ordered to prevent or treat serious conditions such as "deep thrombosis", "pumonary embolisms", and other heart problems.  The amount of time that you may need off of the medication may also vary with the medication and the reason for which you were taking it.  If you are taking any of these medications, please make sure you notify your pain physician before you undergo any procedures.         Facet Joint Block, Care After Refer to this sheet in the next few weeks. These instructions provide you with information on caring for yourself after your procedure. Your health care provider may also give you more specific instructions. Your treatment has been planned according to current medical practices, but problems sometimes occur. Call your health care provider if you have any problems  or questions after your procedure. HOME CARE INSTRUCTIONS   Keep track of the amount of pain relief you feel and how long it lasts.  Limit pain medicine within the first 4-6 hours after the procedure as directed by your health care provider.  Resume taking dietary supplements and medicines as directed by your health care provider.  You may resume your regular diet.  Do not apply heat near or over the injection site(s) for 24 hours.   Do not take a bath or soak in water (such as a pool or lake) for 24 hours.  Do not drive for 24 hours unless approved by your health care provider.  Avoid strenuous activity for 24 hours.  Remove your bandages the morning after the procedure.   If the injection site is tender, applying an ice pack may relieve some tenderness. To do this:  Put ice in a bag.  Place a towel between your skin and the bag.  Leave the ice on for 15-20 minutes, 3-4 times a day.  Keep follow-up appointments as directed by your health care provider. SEEK MEDICAL CARE IF:   Your pain is not controlled by your medicines.   There is drainage from the injection site.   There is significant bleeding or swelling at the injection site.  You have diabetes and your blood sugar is above 180 mg/dL. SEEK IMMEDIATE MEDICAL CARE IF:   You develop a fever of 101F (38.3C) or greater.   You have worsening pain or swelling around the injection site.   You have red streaking around the injection site.   You develop severe pain that is not controlled by your medicines.   You develop a headache, stiff neck, nausea, or vomiting.   Your eyes become very sensitive to light.   You have weakness, paralysis, or tingling in your arms or legs that was not present before the procedure.   You develop difficulty urinating or breathing.    This information is not intended to replace advice given to you by your health care provider. Make sure you discuss any questions you  have with your health care provider.   Document Released: 12/16/2011 Document Revised: 01/19/2014 Document Reviewed: 12/16/2011 Elsevier Interactive Patient Education 2016   [employer] Facet Joint Block The facet joints connect the bones of the spine (vertebrae). They make it possible for you to bend, twist, and make other movements with your spine. They also prevent you from overbending, overtwisting, and making other excessive movements.  A facet joint block is a procedure where a numbing medicine (anesthetic) is injected into a facet joint. Often, a type of anti-inflammatory medicine called a steroid is also injected. A facet joint block may be done for two reasons:   Diagnosis. A facet joint block may be done as a test to see whether neck or back pain is caused by a worn-down or infected facet joint. If the pain gets better after a facet joint block, it means the pain is probably coming from the facet joint. If the pain does not get better, it means the pain is probably not coming from the facet joint.   Therapy. A facet joint block may be done to relieve neck or back pain caused by a facet joint. A facet joint block is only done as a therapy if the pain does not improve with medicine, exercise programs, physical therapy, and other forms of pain management. LET YOUR HEALTH CARE PROVIDER KNOW ABOUT:   Any allergies you have.   All medicines you are taking, including vitamins, herbs, eyedrops, and over-the-counter medicines and creams.   Previous problems you or members of your family have had with the use of anesthetics.   Any blood disorders you have had.   Other health problems you have. RISKS AND COMPLICATIONS Generally, having a facet joint block is safe. However, as with any procedure, complications can occur. Possible complications associated with having a facet joint block include:   Bleeding.   Injury to a nerve near the injection site.   Pain at the injection site.    Weakness or numbness in areas controlled by nerves near the injection site.   Infection.   Temporary fluid retention.   Allergic reaction to anesthetics or medicines used during the procedure. BEFORE THE PROCEDURE   Follow your health care provider's instructions if you are taking dietary supplements or medicines. You may need to stop taking them or reduce your dosage.   Do not take any new dietary supplements or medicines without asking your health care provider first.   Follow your health care provider's instructions about eating and drinking before the procedure. You may need to stop eating and drinking several hours before the procedure.   Arrange to have an adult drive you home after the procedure. PROCEDURE  You may need to remove your clothing and dress in an open-back gown so that your health care provider can access your spine.   The procedure will be done while you are lying on an X-ray table. Most of the time you will be asked to lie on your stomach, but you may be asked to lie in a different position if an injection will be made in your neck.   Special machines will be used to monitor your oxygen levels, heart rate, and blood pressure.   If an injection will be made in your neck, an intravenous (IV) tube will be inserted into one of your veins. Fluids and medicine will flow directly into your body through the IV tube.   The area over the facet joint where the injection will be made will be cleaned with an antiseptic soap. The surrounding skin will be covered with sterile drapes.   An anesthetic will be applied to   your skin to make the injection area numb. You may feel a temporary stinging or burning sensation.   A video X-ray machine will be used to locate the joint. A contrast dye may be injected into the facet joint area to help with locating the joint.   When the joint is located, an anesthetic medicine will be injected into the joint through the  needle.   Your health care provider will ask you whether you feel pain relief. If you do feel relief, a steroid may be injected to provide pain relief for a longer period of time. If you do not feel relief or feel only partial relief, additional injections of an anesthetic may be made in other facet joints.   The needle will be removed, the skin will be cleansed, and bandages will be applied.  AFTER THE PROCEDURE   You will be observed for 15-30 minutes before being allowed to go home. Do not drive. Have an adult drive you or take a taxi or public transportation instead.   If you feel pain relief, the pain will return in several hours or days when the anesthetic wears off.   You may feel pain relief 2-14 days after the procedure. The amount of time this relief lasts varies from person to person.   It is normal to feel some tenderness over the injected area(s) for 2 days following the procedure.   If you have diabetes, you may have a temporary increase in blood sugar.   This information is not intended to replace advice given to you by your health care provider. Make sure you discuss any questions you have with your health care provider.   Document Released: 05/20/2006 Document Revised: 01/19/2014 Document Reviewed: 10/19/2011 Elsevier Interactive Patient Education 2016 [employer]  

## 2015-10-09 NOTE — Telephone Encounter (Signed)
Pt states he is really sore and unable to slle. Instructed to start with heat today and give it time for steroid to take effect. instrcted to call if needed

## 2015-10-14 ENCOUNTER — Encounter: Payer: Medicare Other | Admitting: Pain Medicine

## 2015-10-31 ENCOUNTER — Encounter: Payer: Self-pay | Admitting: Pain Medicine

## 2015-10-31 ENCOUNTER — Ambulatory Visit: Payer: Medicare Other | Attending: Pain Medicine | Admitting: Pain Medicine

## 2015-10-31 DIAGNOSIS — Z7982 Long term (current) use of aspirin: Secondary | ICD-10-CM | POA: Diagnosis not present

## 2015-10-31 DIAGNOSIS — Z87891 Personal history of nicotine dependence: Secondary | ICD-10-CM | POA: Diagnosis not present

## 2015-10-31 DIAGNOSIS — E1140 Type 2 diabetes mellitus with diabetic neuropathy, unspecified: Secondary | ICD-10-CM | POA: Insufficient documentation

## 2015-10-31 DIAGNOSIS — F419 Anxiety disorder, unspecified: Secondary | ICD-10-CM | POA: Diagnosis not present

## 2015-10-31 DIAGNOSIS — G894 Chronic pain syndrome: Secondary | ICD-10-CM

## 2015-10-31 DIAGNOSIS — K219 Gastro-esophageal reflux disease without esophagitis: Secondary | ICD-10-CM | POA: Diagnosis not present

## 2015-10-31 DIAGNOSIS — R1310 Dysphagia, unspecified: Secondary | ICD-10-CM | POA: Insufficient documentation

## 2015-10-31 DIAGNOSIS — J449 Chronic obstructive pulmonary disease, unspecified: Secondary | ICD-10-CM | POA: Diagnosis not present

## 2015-10-31 DIAGNOSIS — I252 Old myocardial infarction: Secondary | ICD-10-CM | POA: Insufficient documentation

## 2015-10-31 DIAGNOSIS — I5022 Chronic systolic (congestive) heart failure: Secondary | ICD-10-CM | POA: Insufficient documentation

## 2015-10-31 DIAGNOSIS — M545 Low back pain: Secondary | ICD-10-CM | POA: Diagnosis not present

## 2015-10-31 DIAGNOSIS — Z794 Long term (current) use of insulin: Secondary | ICD-10-CM | POA: Insufficient documentation

## 2015-10-31 DIAGNOSIS — I110 Hypertensive heart disease with heart failure: Secondary | ICD-10-CM | POA: Diagnosis not present

## 2015-10-31 DIAGNOSIS — E785 Hyperlipidemia, unspecified: Secondary | ICD-10-CM | POA: Insufficient documentation

## 2015-10-31 DIAGNOSIS — M25562 Pain in left knee: Secondary | ICD-10-CM | POA: Insufficient documentation

## 2015-10-31 DIAGNOSIS — Z6835 Body mass index (BMI) 35.0-35.9, adult: Secondary | ICD-10-CM | POA: Diagnosis not present

## 2015-10-31 DIAGNOSIS — E669 Obesity, unspecified: Secondary | ICD-10-CM | POA: Insufficient documentation

## 2015-10-31 DIAGNOSIS — E1165 Type 2 diabetes mellitus with hyperglycemia: Secondary | ICD-10-CM | POA: Insufficient documentation

## 2015-10-31 DIAGNOSIS — Z79891 Long term (current) use of opiate analgesic: Secondary | ICD-10-CM | POA: Insufficient documentation

## 2015-10-31 DIAGNOSIS — Z79899 Other long term (current) drug therapy: Secondary | ICD-10-CM | POA: Diagnosis not present

## 2015-10-31 DIAGNOSIS — I2510 Atherosclerotic heart disease of native coronary artery without angina pectoris: Secondary | ICD-10-CM | POA: Diagnosis not present

## 2015-10-31 MED ORDER — OXYCODONE HCL 15 MG PO TABS: 15.0000 mg | ORAL_TABLET | Freq: Four times a day (QID) | ORAL | 0 refills | Status: DC | PRN

## 2015-10-31 NOTE — Progress Notes (Signed)
Patient's Name: Manuel Knight  MRN: [id number]  Referring Provider: [facility]  DOB: 1960-06-30  PCP: [facility]  DOS: 10/31/2015  Note by: Kathlen Brunswick. Dossie Arbour, MD  Service setting: Ambulatory outpatient  Specialty: Interventional Pain Management  Location: [facility] (AMB) Pain Management Facility    Patient type: Established   Primary Reason(s) for Visit: Encounter for prescription drug management & post-procedure evaluation of chronic illness with mild to moderate exacerbation(Level of risk: moderate) CC: Knee Pain (left ) and Back Pain (lower )  HPI  Manuel Knight is a 55 y.o. year old, male patient, who comes today for an initial evaluation. He has Type 2 diabetes mellitus with hyperglycemia, with long-term current use of insulin ([place]); HLD (hyperlipidemia); Obesity, unspecified; Anxiety state; Essential hypertension; COPD; PULMONARY NODULE; HERNIA; GERD (gastroesophageal reflux disease); Esophageal dysphagia; Chronic diarrhea; Accelerated hypertension; NSTEMI (non-ST elevated myocardial infarction) ([place]); CAD (coronary artery disease), native coronary artery; S/P CABG x 3; Chronic systolic heart failure ([place]); Emphysema of lung ([place]); Disorder of joint of spine ([place]); H/O coronary artery bypass surgery; Chronic pain; Encounter for pain management planning; Encounter for therapeutic drug level monitoring; Opiate use (90 MME/Day); Chronic low back pain (Location of Secondary source of pain) (Bilateral) (L>R); Chronic knee pain (Location of Primary Source of Pain) (Bilateral) (L>R); Chronic foot pain (Bilateral) (L>R); Disturbance of skin sensation; Diabetic peripheral neuropathy (HCC) (lower extremity); Chronic lumbar radicular pain (L5 dermatome) (Location of Tertiary source of pain) (Left); 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) (L>R); Osteoarthritis of knee (Bilateral) (L>R); Lumbar spondylosis; Lumbar facet syndrome  (Location of Secondary source of pain) (Bilateral) (L>R); Lumbar facet arthropathy; Chronic lower extremity pain (Location of Tertiary source of pain) (Bilateral) (L>R); History of marijuana use; Hypomagnesemia; Vitamin D insufficiency; Long term current use of opiate analgesic; and Long term prescription opiate use on his problem list.. His primarily concern today is the Knee Pain (left ) and Back Pain (lower )  Pain Assessment: Self-Reported Pain Score: 4 /10 Clinically the patient looks like a 1/10 Reported level is inconsistent with clinical observations. Information on the proper use of the pain score provided to the patient today. Pain Type: Chronic pain Pain Location: Knee (back) Pain Orientation: Left (lower back) Pain Descriptors / Indicators: Shooting, Throbbing Pain Frequency: Constant  Manuel Knight was last seen on 10/08/2015 for a procedure. During today's appointment we reviewed Mr. Gearhart's post-procedure results, as well as the outpatient medication regimen. Details on both assessments and plan are as follows.  Controlled Substance Pharmacotherapy Assessment REMS (Risk Evaluation and Mitigation Strategy)  Analgesic:Oxycodone IR 15 mg every 6 hours (60 mg/day) MME/day:90 mg/day. Nursing Pain Medication Assessment:  Safety precautions to be maintained throughout the outpatient stay will include: orient to surroundings, keep bed in low position, maintain call bell within reach at all times, provide assistance with transfer out of bed and ambulation.  Medication Inspection Compliance: Pill count conducted under aseptic conditions, in front of the patient. Neither the pills nor the bottle was removed from the patient's sight at any time. Once count was completed pills were immediately returned to the patient in their original bottle. Pill Count: 27 of 120 pills remain. Based on my calculations that patient took more medicine than prescribed since it looks like he may be running out on  11/06/2015. Bottle Appearance: 55 Standard pharmacy container. Clearly labeled. Medication: Oxycodone 15 mg Filled Date: 09 / 30 / 2017 Pharmacokinetics: Onset of action (Liberation/Absorption): Within expected pharmacological  parameters Time to Peak effect (Distribution): Timing and results are as within normal expected parameters Duration of action (Metabolism/Excretion): Within normal limits for medication Pharmacodynamics: Analgesic Effect: More than 50% Activity Facilitation: Medication(s) allow patient to sit, stand, walk, and do the basic ADLs Perceived Effectiveness: Described as relatively effective, allowing for increase in activities of daily living (ADL) Side-effects or Adverse reactions: None reported Monitoring: [place] PMP: Online review of the past 57-month period conducted. Compliant with practice rules and regulations List of all UDS test(s) done:  Lab Results  Component Value Date   TOXASSSELUR FINAL 08/27/2015   SUMMARY FINAL 06/13/2015   Last UDS on record: ToxAssure Select 13  Date Value Ref Range Status  08/27/2015 FINAL  Final    Comment:    ==================================================================== TOXASSURE SELECT 13 (MW) ==================================================================== Test                             Result       Flag       Units Drug Present and Declared for Prescription Verification   Alprazolam                     128          EXPECTED   ng/mg creat   Alpha-hydroxyalprazolam        63           EXPECTED   ng/mg creat    Source of alprazolam is a scheduled prescription medication.    Alpha-hydroxyalprazolam is an expected metabolite of alprazolam.   Oxycodone                      1064         EXPECTED   ng/mg creat   Oxymorphone                    107          EXPECTED   ng/mg creat   Noroxycodone                   576          EXPECTED   ng/mg creat    Sources of oxycodone include scheduled prescription medications.     Oxymorphone and noroxycodone are expected metabolites of    oxycodone. Oxymorphone is also available as a scheduled    prescription medication. Drug Present not Declared for Prescription Verification   Alcohol, Ethyl                 >0.400       UNEXPECTED g/dL    Sources of ethyl alcohol include alcoholic beverages or as a    fermentation product of glucose; glucose is present in this    specimen.  The high concentration of ethyl alcohol and the    presence of glucose supports fermentation as the source of ethyl    alcohol in this specimen. Drug Absent but Declared for Prescription Verification   Amphetamine                    Not Detected UNEXPECTED ng/mg creat ==================================================================== Test                      Result    Flag   Units      Ref Range   Creatinine  102              mg/dL      >=20 ==================================================================== Declared Medications:  The flagging and interpretation on this report are based on the  following declared medications.  Unexpected results may arise from  inaccuracies in the declared medications.  **Note: The testing scope of this panel includes these medications:  Alprazolam (Xanax)  Amphetamine (Adderall)  Oxycodone (Roxicodone)  **Note: The testing scope of this panel does not include following  reported medications:  Albuterol (ProAir)  Albuterol (Proventil)  Amlodipine (Norvasc)  Aspirin  Atorvastatin (Lipitor)  Benazepril (Lotensin)  Canagliflozin (Invokana)  Carvedilol (Coreg)  Citalopram (Celexa)  Clonidine (Catapres)  Duloxetine (Cymbalta)  Fluticasone (Advair)  Gabapentin  Insulin (Levemir)  Insulin (NovoLog)  Magnesium  Meloxicam (Mobic)  Metformin (Glucophage)  Mupirocin (Bactroban)  Naloxone  Nitroglycerin (Nitrostat)  Pantoprazole (Protonix)  Sacubitril (Entresto)  Salmeterol (Advair)  Tiotropium (Spiriva)  Valsartan (Entresto)   Vitamin D2 (Drisdol) ==================================================================== For clinical consultation, please call [phone]. ====================================================================    Summary  Date Value Ref Range Status  06/13/2015 FINAL  Final    Comment:    ==================================================================== TOXASSURE COMP DRUG ANALYSIS,UR ==================================================================== Test                             Result       Flag       Units Drug Present and Declared for Prescription Verification   Citalopram                     PRESENT      EXPECTED   Desmethylcitalopram            PRESENT      EXPECTED    Desmethylcitalopram is an expected metabolite of citalopram or    the enantiomeric form, escitalopram. Drug Present not Declared for Prescription Verification   Alcohol, Ethyl                 0.381        UNEXPECTED g/dL    Sources of ethyl alcohol include alcoholic beverages or as a    fermentation product of glucose; glucose is present in this    specimen.  The high concentration of ethyl alcohol and the    presence of glucose supports fermentation as the source of ethyl    alcohol in this specimen. Drug Absent but Declared for Prescription Verification   Amphetamine                    Not Detected UNEXPECTED ng/mg creat   Alprazolam                     Not Detected UNEXPECTED ng/mg creat   Salicylate                     Not Detected UNEXPECTED    Aspirin, as indicated in the declared medication list, is not    always detected even when used as directed. ==================================================================== Test                      Result    Flag   Units      Ref Range   Creatinine              80               mg/dL      >=  20 ==================================================================== Declared Medications:  The flagging and interpretation on this report are based on  the  following declared medications.  Unexpected results may arise from  inaccuracies in the declared medications.  **Note: The testing scope of this panel includes these medications:  Alprazolam (Xanax)  Amphetamine (Adderall)  Citalopram (Celexa)  **Note: The testing scope of this panel does not include small to  moderate amounts of these reported medications:  Aspirin (Aspirin 81)  **Note: The testing scope of this panel does not include following  reported medications:  Albuterol (ProAir HFA)  Amlodipine (Norvasc)  Atorvastatin (Lipitor)  Canagliflozin (Invokana)  Fluticasone (Advair)  Insulin (NovoLog)  Meloxicam (Mobic)  Metformin (Glucophage)  Mupirocin (Bactroban)  Nitroglycerin (Nitrostat)  Pantoprazole (Protonix)  Sacubitril (Entresto)  Salmeterol (Advair)  Tiotropium (Spiriva)  Valsartan (Entresto) ==================================================================== For clinical consultation, please call [phone]. ====================================================================    UDS interpretation: Compliant          Medication Assessment Form: Reviewed. Patient indicates being compliant with therapy Treatment compliance: Compliant Risk Assessment Profile: Aberrant/High Risk Behavior: None observed or detected today Risk Factors for Fatal Opioid Overdose: None new ones identified today Substance Use Disorder (SUD) Risk Level: Low Opioid Risk Tool (ORT) Total Score:    ORT Score Interpretation Table:  Score <3 = Low Risk for SUD  Score between 4-7 = Moderate Risk for SUD  Score >8 = High Risk for Opioid Abuse   Risk Mitigation Strategies:  Patient Counseling:  Covered Patient-Prescriber Agreement (PPA): Present and active  Notification to other healthcare providers: Done  Pharmacologic Plan: No change in therapy, at this time  Post-Procedure Assessment  Procedure done on 10/08/2015: Diagnostic bilateral lumbar facet block under fluoroscopic  guidance and IV sedation. Complications experienced at the time of the procedure: None Side-effects or Adverse reactions: None reported Sedation: Sedation provided. When no sedatives are used, the analgesic levels obtained are directly associated with the effectiveness of the local anesthetics. On the other hand, when sedation is provided, the level of analgesia obtained during the initial 1 hour, immediately following the intervention, is believed to be the result of a combination of factors. These factors may include, but are not limited to: 1. The effectiveness of the local anesthetics used. 2. The effects of the analgesic(s) and/or anxiolytic(s) used. 3. The degree of discomfort experienced by the patient at the time of the procedure. 4. The patients ability and reliability in recalling and recording the events. 5. The presence and influence of possible secondary gains. Results: Relief during the 1st hour after the procedure: 0 % (Ultra-Short Term Relief) when the patient left the clinics indicated having a pain score 0/10. Interpretative note: No analgesic relief from Local Anesthetics would suggest pain etiology to reside elsewhere Local Anesthesia: Long-acting (4-6 hours) anesthetics used. The analgesic levels attained during this period are directly associated to the localized infiltration of local anesthetics and therefore cary significant diagnostic value as to the etiological location or origin of the pain. Results: Relief during the next 4 to 6 hour after the procedure: 0 % (Short Term Relief) Interpretative note: Unexpected non-physiological response Long-Term Therapy: Steroids used. Results: Extended relief following procedure: 0 % Interpretative note: No benefit could suggest etiology to be non-inflammatory, possibly mechanical compression or irritation         Long-Term Benefits:  Current Relief (Now): 0%  Interpretative note: No benefit would suggest neuropathy to be associated  with permanent nerve damage, persistent neural entrapment, or mechanical compression/impingement, as opposed to an inflammatory-mediated neuropraxia  Interpretation of Results: After carefully talking to the patient about the results, I would seem that he did not to anything correct in the postoperative period. He laid on the eyes for hours, without breaks which probably caused some additional pain as a consequence of this once the local anesthetic wore off. In addition, his recollection about the first hour is incompatible with the information that we collected immediately after the procedure when he indicated having absolutely no pain. Due to all of this, we have decided that we need to repeat this procedure since he admits having done the wrong things.          Laboratory Chemistry  Inflammation Markers Lab Results  Component Value Date   ESRSEDRATE 2 06/13/2015   CRP 0.8 06/13/2015   Renal Function Lab Results  Component Value Date   BUN 15 06/13/2015   CREATININE 0.98 06/13/2015   GFRAA >60 06/13/2015   GFRNONAA >60 06/13/2015   Hepatic Function Lab Results  Component Value Date   AST 17 06/13/2015   ALT 19 06/13/2015   ALBUMIN 4.0 06/13/2015   Electrolytes Lab Results  Component Value Date   NA 138 06/13/2015   K 4.6 06/13/2015   CL 107 06/13/2015   CALCIUM 9.4 06/13/2015   MG 1.4 (L) 06/13/2015   Pain Modulating Vitamins Lab Results  Component Value Date   VD25OH 31 02/03/2010   25OHVITD1 25 (L) 06/13/2015   25OHVITD2 <1.0 06/13/2015   25OHVITD3 25 06/13/2015   VITAMINB12 1,002 (H) 06/13/2015   Coagulation Parameters Lab Results  Component Value Date   INR 1.59 (H) 02/06/2013   LABPROT 18.5 (H) 02/06/2013   APTT 40 (H) 02/06/2013   PLT 307 03/15/2015   Cardiovascular Lab Results  Component Value Date   HGB 14.5 03/15/2015   HCT 45.5 03/15/2015   Note: Lab results reviewed.  Recent Diagnostic Imaging Review  Dg C-arm 1-60 Min-no Report  Result Date:  10/08/2015 CLINICAL DATA: Sub-acute chronic pain syndrome C-ARM 1-60 MINUTES Fluoroscopy was utilized by the requesting physician.  No radiographic interpretation.    Meds  The patient has a current medication list which includes the following prescription(s): albuterol, alprazolam, amlodipine, amphetamine-dextroamphetamine, aspirin ec, atorvastatin, benazepril, canagliflozin, carvedilol, citalopram, clonidine, d3 super strength, duloxetine, fluticasone, fluticasone-salmeterol, gabapentin, insulin aspart, insulin pen needle, insulin pen needle, insulin syringe-needle u-100, insulin syringe-needle u-100, levemir flextouch, litetouch pen needles, magnesium oxide, meloxicam, metformin, mupirocin ointment, naloxone hcl, nitroglycerin, oxycodone, pantoprazole, proair hfa, sacubitril-valsartan, sure comfort lancets 30g, tiotropium, truetrack test, and vitamin d (ergocalciferol).  Current Outpatient Prescriptions on File Prior to Visit  Medication Sig  . albuterol (PROAIR HFA) 108 (90 Base) MCG/ACT inhaler Inhale into the lungs.  Marland Kitchen alprazolam (XANAX) 2 MG tablet Take 1 tablet by mouth 3 (three) times daily as needed.   Marland Kitchen amLODipine (NORVASC) 10 MG tablet Take 10 mg by mouth daily.  Marland Kitchen amphetamine-dextroamphetamine (ADDERALL) 20 MG tablet Take 20 mg by mouth 3 (three) times daily.   Marland Kitchen aspirin EC 81 MG tablet Take 1 tablet (81 mg total) by mouth daily.  Marland Kitchen atorvastatin (LIPITOR) 80 MG tablet Take 1 tablet by mouth daily.  . benazepril (LOTENSIN) 20 MG tablet Take by mouth.  . Canagliflozin (INVOKANA) 100 MG TABS Take 100 mg by mouth daily.  . carvedilol (COREG) 25 MG tablet Take 25 mg by mouth 2 (two) times daily with a meal.   . citalopram (CELEXA) 20 MG tablet Take 1 tablet by mouth daily.  . cloNIDine (CATAPRES) 0.3 MG  tablet Take 0.3 mg by mouth 2 (two) times daily.  . D3 SUPER STRENGTH 2000 units CAPS Take 1 capsule by mouth daily.  . DULoxetine (CYMBALTA) 30 MG capsule 1 tab daily for one day, then 2  tabs daily  . fluticasone (FLONASE) 50 MCG/ACT nasal spray USE TWO SPRAYS IN EACH NOSTRIL TWICE DAILY FOR 7 DAYS, THEN USE TWO SPRAYS ONCE DAILY AS DIRECTED.  Marland Kitchen Fluticasone-Salmeterol (ADVAIR DISKUS) 500-50 MCG/DOSE AEPB Inhale 1 puff into the lungs every 12 (twelve) hours.    . gabapentin (NEURONTIN) 600 MG tablet Take 600 mg by mouth 3 (three) times daily.  . insulin aspart (NOVOLOG FLEXPEN) 100 UNIT/ML FlexPen Inject 0-26 Units into the skin 3 (three) times daily with meals. 20 units three times a day with sliding scale max of 26 units  . Insulin Pen Needle (LITETOUCH PEN NEEDLES) 29G X 12.7MM MISC Use as directed.  . Insulin Pen Needle (PRODIGY INSULIN PEN NEEDLES) 29G X 13MM MISC Use as directed.  . Insulin Syringe-Needle U-100 (B-D INS SYR ULTRAFINE 1CC/31G) 31G X 5/16" 1 ML MISC   . Insulin Syringe-Needle U-100 (B-D INS SYRINGE 0.5CC/31GX5/16) 31G X 5/16" 0.5 ML MISC   . LEVEMIR FLEXTOUCH 100 UNIT/ML Pen INJECT 20 UNITS EVERY MORNING.  . LITETOUCH PEN NEEDLES 29G X 12.7MM MISC   . Magnesium Oxide 500 MG CAPS Take 1 capsule (500 mg total) by mouth 2 (two) times daily at 8 am and 10 pm.  . meloxicam (MOBIC) 15 MG tablet Take 15 mg by mouth 2 (two) times daily.  . metFORMIN (GLUCOPHAGE) 1000 MG tablet Take 1,000 mg by mouth 2 (two) times daily with a meal.  . mupirocin ointment (BACTROBAN) 2 % Apply topically.  . naloxone HCl 4 MG/0.1ML LIQD One spray in either nostril once for known/suspected opioid overdose. May repeat every 2-3 minutes in alternating nostril til EMS arrives  . nitroGLYCERIN (NITROSTAT) 0.4 MG SL tablet Place 1 tablet (0.4 mg total) under the tongue every 5 (five) minutes as needed for chest pain.  . pantoprazole (PROTONIX) 40 MG tablet 1 tablet Two (2) times a day.  Marland Kitchen PROAIR HFA 108 (90 BASE) MCG/ACT inhaler Inhale 2 puffs into the lungs every 4 (four) hours as needed.  . sacubitril-valsartan (ENTRESTO) 97-103 MG Take 1 tablet by mouth 2 (two) times daily.  . SURE COMFORT  LANCETS 30G MISC USE TO CHECK SUGAR THREE TIMES DAILY {AS DIRECTED]  . tiotropium (SPIRIVA) 18 MCG inhalation capsule Place 18 mcg into inhaler and inhale daily.    Angelia Mould TEST test strip USE TO CHECK BLOOD SUGAR THREE TIMES DAILY  . Vitamin D, Ergocalciferol, (DRISDOL) 50000 units CAPS capsule TAKE ONE CAPSULE BY MOUTH TWICE A WEEK   No current facility-administered medications on file prior to visit.    ROS  Constitutional: Denies any fever or chills Gastrointestinal: No reported hemesis, hematochezia, vomiting, or acute GI distress Musculoskeletal: Denies any acute onset joint swelling, redness, loss of ROM, or weakness Neurological: No reported episodes of acute onset apraxia, aphasia, dysarthria, agnosia, amnesia, paralysis, loss of coordination, or loss of consciousness  Allergies  Mr. Wishon is allergic to lodine [etodolac]; tylenol [acetaminophen]; iodine; and propoxyphene n-acetaminophen.  [place]  Drug: Mr. Leser  reports that he does not use drugs. Alcohol:  reports that he does not drink alcohol. Tobacco:  reports that he quit smoking about 26 years ago. His smoking use included Cigarettes. He started smoking about 48 years ago. He has a 28.50 pack-year smoking history. He  has never used smokeless tobacco. Medical:  has a past medical history of Anxiety; Cellulitis (2011); Chronic back pain; Claustrophobia; COPD (chronic obstructive pulmonary disease) ([place]); Diabetes mellitus, type 2 ([place]); Diverticulitis of colon (2008); GERD (gastroesophageal reflux disease); Heart attack; Hyperlipidemia; Hypertension; Obesity; and Pulmonary nodule. Family: family history includes Cancer in his mother; Coronary artery disease in his mother and sister; Diabetes in his sister; Heart attack in his brother and father.  Past Surgical History:  Procedure Laterality Date  . APPENDECTOMY  2007   gangrenous appendicitis   . CORONARY ARTERY BYPASS GRAFT N/A 02/06/2013   Procedure: CORONARY ARTERY  BYPASS GRAFTING (CABG) with TEE;  Surgeon: [name], MD;  Location: [facility];  Service: Open Heart Surgery;  Laterality: N/A;  CABG x three,  using left internal mammary artery and right leg greater saphenous vein harvested endoscopically  . LEFT HEART CATHETERIZATION WITH CORONARY ANGIOGRAM N/A 02/03/2013   Procedure: LEFT HEART CATHETERIZATION WITH CORONARY ANGIOGRAM;  Surgeon: Jettie Booze, MD;  Location: [facility] CATH LAB;  Service: Cardiovascular;  Laterality: N/A;  . TONSILLECTOMY     Constitutional Exam  General appearance: Well nourished, well developed, and well hydrated. In no apparent acute distress Vitals:   10/31/15 1350  BP: 121/63  Pulse: 86  Resp: 16  Temp: 97.8 F (36.6 C)  SpO2: 95%  Weight: 242 lb (109.8 kg)  Height: 5\' 9"  (1.753 m)   BMI Assessment: Estimated body mass index is 35.74 kg/m as calculated from the following:   Height as of this encounter: 5\' 9"  (1.753 m).   Weight as of this encounter: 242 lb (109.8 kg).  BMI interpretation table: BMI level Category Range association with higher incidence of chronic pain  <18 kg/m2 Underweight   18.5-24.9 kg/m2 Ideal body weight   25-29.9 kg/m2 Overweight Increased incidence by 20%  30-34.9 kg/m2 Obese (Class I) Increased incidence by 68%  35-39.9 kg/m2 Severe obesity (Class II) Increased incidence by 136%  >40 kg/m2 Extreme obesity (Class III) Increased incidence by 254%   BMI Readings from Last 4 Encounters:  10/31/15 35.74 kg/m  10/08/15 35.74 kg/m  10/02/15 36.18 kg/m  09/10/15 35.88 kg/m   Wt Readings from Last 4 Encounters:  10/31/15 242 lb (109.8 kg)  10/08/15 242 lb (109.8 kg)  10/02/15 245 lb (111.1 kg)  09/10/15 243 lb (110.2 kg)  Psych/Mental status: Alert, oriented x 3 (person, place, & time) Eyes: PERLA Respiratory: No evidence of acute respiratory distress  Cervical Spine Exam  Inspection: No masses, redness, or swelling Alignment: Symmetrical Functional ROM: Unrestricted  ROM Stability: No instability detected Muscle strength & Tone: Functionally intact Sensory: Unimpaired Palpation: Non-contributory  Upper Extremity (UE) Exam    Side: Right upper extremity  Side: Left upper extremity  Inspection: No masses, redness, swelling, or asymmetry  Inspection: No masses, redness, swelling, or asymmetry  Functional ROM: Unrestricted ROM         Functional ROM: Unrestricted ROM          Muscle strength & Tone: Functionally intact  Muscle strength & Tone: Functionally intact  Sensory: Unimpaired  Sensory: Unimpaired  Palpation: Non-contributory  Palpation: Non-contributory   Thoracic Spine Exam  Inspection: No masses, redness, or swelling Alignment: Symmetrical Functional ROM: Unrestricted ROM Stability: No instability detected Sensory: Unimpaired Muscle strength & Tone: Functionally intact Palpation: Non-contributory  Lumbar Spine Exam  Inspection: No masses, redness, or swelling Alignment: Symmetrical Functional ROM: Unrestricted ROM Stability: No instability detected Muscle strength & Tone: Functionally intact Sensory: Unimpaired  Palpation: Non-contributory Provocative Tests: Lumbar Hyperextension and rotation test: evaluation deferred today       Patrick's Maneuver: evaluation deferred today              Gait & Posture Assessment  Ambulation: Unassisted Gait: Relatively normal for age and body habitus Posture: WNL   Lower Extremity Exam    Side: Right lower extremity  Side: Left lower extremity  Inspection: No masses, redness, swelling, or asymmetry  Inspection: No masses, redness, swelling, or asymmetry  Functional ROM: Unrestricted ROM          Functional ROM: Unrestricted ROM          Muscle strength & Tone: Functionally intact  Muscle strength & Tone: Functionally intact  Sensory: Unimpaired  Sensory: Unimpaired  Palpation: Non-contributory  Palpation: Non-contributory   Assessment  Primary Diagnosis & Pertinent Problem List: The  encounter diagnosis was Chronic pain syndrome.  Visit Diagnosis: 1. Chronic pain syndrome    Plan of Care  Pharmacotherapy (Medications Ordered): Meds ordered this encounter  Medications  . DISCONTD: oxyCODONE (ROXICODONE) 15 MG immediate release tablet    Sig: Take 1 tablet (15 mg total) by mouth every 6 (six) hours as needed. for pain    Dispense:  120 tablet    Refill:  0    Do not add this medication to the electronic "Automatic Refill" notification system. Patient may have prescription filled one day early if pharmacy is closed on scheduled refill date. Do not fill until: 11/11/15 To last until: 12/11/15  . oxyCODONE (ROXICODONE) 15 MG immediate release tablet    Sig: Take 1 tablet (15 mg total) by mouth every 6 (six) hours as needed. for pain    Dispense:  120 tablet    Refill:  0    Do not add this medication to the electronic "Automatic Refill" notification system. Patient may have prescription filled one day early if pharmacy is closed on scheduled refill date. Do not fill until: 11/11/15 To last until: 12/11/15   New Prescriptions   No medications on file   Medications administered during this visit: Mr. Kulich had no medications administered during this visit. Lab-work, Procedure(s), & Referral(s) Ordered: No orders of the defined types were placed in this encounter.  Imaging & Referral(s) Ordered: None  Interventional Therapies: Scheduled:  Diagnostic Bilateral Lumbar Facet Block   Considering: Diagnostic bilateral intra-articular knee injection, without fluoroscopic guidance or IV sedation.  Diagnostic bilateral genicular nerve block under fluoroscopic guidance, with a without sedation.  Possible bilateral genicular nerve radiofrequency ablation under fluoroscopic guidance and IV sedation, the pending on the results of the diagnostic injection.  Diagnostic bilateral lumbar facet block under fluoroscopic guidance and IV sedation.  Possible bilateral lumbar  facet radiofrequency ablation under fluoroscopic guidance and IV sedation depending on the results of the diagnostic injection.  Diagnostic left-sided L4-5 lumbar epidural steroid injection under fluoroscopic guidance, with or without IV sedation.  Diagnostic bilateral sacroiliac joint block under fluoroscopic guidance, with or without sedation.  Possible bilateral sacroiliac joint radiofrequency ablation under fluoroscopic guidance and IV sedation, the pending on the results of the diagnostic injection.    PRN Procedures: Diagnostic bilateral intra-articular knee injection, without fluoroscopic guidance or IV sedation.  Diagnostic bilateral genicular nerve block under fluoroscopic guidance, with a without sedation.  Diagnostic bilateral lumbar facet block under fluoroscopic guidance and IV sedation.  Diagnostic left-sided L4-5 lumbar epidural steroid injection under fluoroscopic guidance, with or without IV sedation.  Diagnostic bilateral sacroiliac joint block under  fluoroscopic guidance, with or without sedation.    Requested PM Follow-up: Return in about 1 month (around 12/01/2015) for Med-Mgmt.  No future appointments. Primary Care Physician: [facility] Location: [facility] Outpatient [facility] Facility Note by: Kathlen Brunswick. Dossie Arbour, M.D, DABA, DABAPM, DABPM, DABIPP, FIPP  Pain Score Disclaimer: We use the NRS-11 scale. This is a self-reported, subjective measurement of pain severity with only modest accuracy. It is used primarily to identify changes within a particular patient. It must be understood that outpatient pain scales are significantly less accurate that those used for research, where they can be applied under ideal controlled circumstances with minimal exposure to variables. In reality, the score is likely to be a combination of pain intensity and pain affect, where pain affect describes the degree of emotional arousal or changes in action  readiness caused by the sensory experience of pain. Factors such as social and work situation, setting, emotional state, anxiety levels, expectation, and prior pain experience may influence pain perception and show large inter-individual differences that may also be affected by time variables.  Patient instructions provided during this appointment: There are no Patient Instructions on file for this visit.

## 2015-10-31 NOTE — Progress Notes (Addendum)
Nursing Pain Medication Assessment:  Safety precautions to be maintained throughout the outpatient stay will include: orient to surroundings, keep bed in low position, maintain call bell within reach at all times, provide assistance with transfer out of bed and ambulation.  Medication Inspection Compliance: Pill count conducted under aseptic conditions, in front of the patient. Neither the pills nor the bottle was removed from the patient's sight at any time. Once count was completed pills were immediately returned to the patient in their original bottle. Pill Count: 27 of 120 pills remain Bottle Appearance: Standard pharmacy container. Clearly labeled. Medication: Oxycodone 15 mg Filled Date: 09 / 30 / 2017

## 2015-11-09 ENCOUNTER — Other Ambulatory Visit: Payer: Self-pay | Admitting: Cardiovascular Disease

## 2015-11-28 ENCOUNTER — Encounter: Payer: Self-pay | Admitting: Pain Medicine

## 2015-11-28 ENCOUNTER — Ambulatory Visit: Payer: Medicare Other | Attending: Pain Medicine | Admitting: Pain Medicine

## 2015-11-28 VITALS — BP 140/76 | HR 86 | Temp 98.2°F | Resp 18 | Ht 69.0 in | Wt 246.0 lb

## 2015-11-28 DIAGNOSIS — E669 Obesity, unspecified: Secondary | ICD-10-CM | POA: Insufficient documentation

## 2015-11-28 DIAGNOSIS — K219 Gastro-esophageal reflux disease without esophagitis: Secondary | ICD-10-CM | POA: Insufficient documentation

## 2015-11-28 DIAGNOSIS — M25562 Pain in left knee: Secondary | ICD-10-CM | POA: Insufficient documentation

## 2015-11-28 DIAGNOSIS — M1288 Other specific arthropathies, not elsewhere classified, other specified site: Secondary | ICD-10-CM | POA: Diagnosis not present

## 2015-11-28 DIAGNOSIS — M79604 Pain in right leg: Secondary | ICD-10-CM | POA: Diagnosis not present

## 2015-11-28 DIAGNOSIS — Z79891 Long term (current) use of opiate analgesic: Secondary | ICD-10-CM | POA: Diagnosis not present

## 2015-11-28 DIAGNOSIS — Z79899 Other long term (current) drug therapy: Secondary | ICD-10-CM | POA: Diagnosis not present

## 2015-11-28 DIAGNOSIS — G8929 Other chronic pain: Secondary | ICD-10-CM

## 2015-11-28 DIAGNOSIS — Z794 Long term (current) use of insulin: Secondary | ICD-10-CM | POA: Insufficient documentation

## 2015-11-28 DIAGNOSIS — J449 Chronic obstructive pulmonary disease, unspecified: Secondary | ICD-10-CM | POA: Insufficient documentation

## 2015-11-28 DIAGNOSIS — M25561 Pain in right knee: Secondary | ICD-10-CM | POA: Insufficient documentation

## 2015-11-28 DIAGNOSIS — M545 Low back pain, unspecified: Secondary | ICD-10-CM

## 2015-11-28 DIAGNOSIS — I252 Old myocardial infarction: Secondary | ICD-10-CM | POA: Insufficient documentation

## 2015-11-28 DIAGNOSIS — M79605 Pain in left leg: Secondary | ICD-10-CM

## 2015-11-28 DIAGNOSIS — M533 Sacrococcygeal disorders, not elsewhere classified: Secondary | ICD-10-CM

## 2015-11-28 DIAGNOSIS — E785 Hyperlipidemia, unspecified: Secondary | ICD-10-CM | POA: Diagnosis not present

## 2015-11-28 DIAGNOSIS — G894 Chronic pain syndrome: Secondary | ICD-10-CM | POA: Diagnosis present

## 2015-11-28 DIAGNOSIS — F419 Anxiety disorder, unspecified: Secondary | ICD-10-CM | POA: Diagnosis not present

## 2015-11-28 DIAGNOSIS — E1165 Type 2 diabetes mellitus with hyperglycemia: Secondary | ICD-10-CM | POA: Diagnosis not present

## 2015-11-28 DIAGNOSIS — F1190 Opioid use, unspecified, uncomplicated: Secondary | ICD-10-CM | POA: Diagnosis not present

## 2015-11-28 DIAGNOSIS — I2510 Atherosclerotic heart disease of native coronary artery without angina pectoris: Secondary | ICD-10-CM | POA: Diagnosis not present

## 2015-11-28 DIAGNOSIS — I110 Hypertensive heart disease with heart failure: Secondary | ICD-10-CM | POA: Insufficient documentation

## 2015-11-28 DIAGNOSIS — I5022 Chronic systolic (congestive) heart failure: Secondary | ICD-10-CM | POA: Diagnosis not present

## 2015-11-28 DIAGNOSIS — M47816 Spondylosis without myelopathy or radiculopathy, lumbar region: Secondary | ICD-10-CM

## 2015-11-28 MED ORDER — OXYCODONE HCL 15 MG PO TABS: 15.0000 mg | ORAL_TABLET | Freq: Four times a day (QID) | ORAL | 0 refills | Status: DC | PRN

## 2015-11-28 NOTE — Progress Notes (Signed)
Nursing Pain Medication Assessment:  Safety precautions to be maintained throughout the outpatient stay will include: orient to surroundings, keep bed in low position, maintain call bell within reach at all times, provide assistance with transfer out of bed and ambulation.  Medication Inspection Compliance: Pill count conducted under aseptic conditions, in front of the patient. Neither the pills nor the bottle was removed from the patient's sight at any time. Once count was completed pills were immediately returned to the patient in their original bottle.  Medication: Oxycodone IR Pill Count: 49 of 120 pills remain Bottle Appearance: Standard pharmacy container. Clearly labeled. Filled Date: 10 / 30 2017 Medication last intake: 11-27-15 at 1220

## 2015-11-28 NOTE — Patient Instructions (Signed)

## 2015-11-28 NOTE — Progress Notes (Signed)
Patient's Name: Manuel Knight  MRN: [id number]  Referring Provider: [facility]  DOB: 12-21-1960  PCP: [facility]  DOS: 11/28/2015  Note by: Kathlen Brunswick. Dossie Arbour, MD  Service setting: Ambulatory outpatient  Specialty: Interventional Pain Management  Location: [facility] (AMB) Pain Management Facility    Patient type: Established   Primary Reason(s) for Visit: Encounter for prescription drug management (Level of risk: moderate) CC: Knee Pain (left) and Back Pain (lower)  HPI  Manuel Knight is a 55 y.o. year old, male patient, who comes today for a medication management evaluation. He has Type 2 diabetes mellitus with hyperglycemia, with long-term current use of insulin ([place]); HLD (hyperlipidemia); Obesity, unspecified; Anxiety state; Essential hypertension; COPD; PULMONARY NODULE; HERNIA; GERD (gastroesophageal reflux disease); Esophageal dysphagia; Chronic diarrhea; Accelerated hypertension; NSTEMI (non-ST elevated myocardial infarction) ([place]); CAD (coronary artery disease), native coronary artery; S/P CABG x 3; Chronic systolic heart failure ([place]); Emphysema of lung ([place]); Disorder of joint of spine ([place]); H/O coronary artery bypass surgery; Encounter for pain management planning; Encounter for therapeutic drug level monitoring; Opiate use (90 MME/Day); Chronic low back pain (Location of Secondary source of pain) (Bilateral) (L>R); Chronic knee pain (Location of Primary Source of Pain) (Bilateral) (L>R); Chronic foot pain (Bilateral) (L>R); Disturbance of skin sensation; Diabetic peripheral neuropathy (HCC) (lower extremity); Chronic lumbar radicular pain (L5 dermatome) (Location of Tertiary source of pain) (Left); 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) (L>R); Osteoarthritis of knee (Bilateral) (L>R); Lumbar spondylosis; Lumbar facet syndrome (Location of Secondary source of pain) (Bilateral) (L>R); Lumbar facet arthropathy;  Chronic lower extremity pain (Location of Tertiary source of pain) (Bilateral) (L>R); History of marijuana use; Hypomagnesemia; Vitamin D insufficiency; Long term current use of opiate analgesic; Long term prescription opiate use; and Chronic pain syndrome on his problem list. His primarily concern today is the Knee Pain (left) and Back Pain (lower)  Pain Assessment: Self-Reported Pain Score: 5 /10 Clinically the patient looks like a 2/10 Reported level is inconsistent with clinical observations. Information on the proper use of the pain score provided to the patient today. Pain Type: Chronic pain Pain Location: Knee Pain Orientation: Left Pain Descriptors / Indicators: Aching Pain Frequency: Constant  Manuel Knight was last seen on 10/31/2015 for medication management. During today's appointment we reviewed Manuel Knight's chronic pain status, as well as his outpatient medication regimen.  The patient  reports that he does not use drugs. His body mass index is 36.33 kg/m.  Further details on both, my assessment(s), as well as the proposed treatment plan, please see below.  Controlled Substance Pharmacotherapy Assessment REMS (Risk Evaluation and Mitigation Strategy)  Analgesic:Oxycodone IR 15 mg every 6 hours (60 mg/day) MME/day:90 mg/day. [name], RN  11/28/2015  1:10 PM  Sign at close encounter Nursing Pain Medication Assessment:  Safety precautions to be maintained throughout the outpatient stay will include: orient to surroundings, keep bed in low position, maintain call bell within reach at all times, provide assistance with transfer out of bed and ambulation.  Medication Inspection Compliance: Pill count conducted under aseptic conditions, in front of the patient. Neither the pills nor the bottle was removed from the patient's sight at any time. Once count was completed pills were immediately returned to the patient in their original bottle.  Medication: Oxycodone IR Pill  Count: 49 of 120 pills remain Bottle Appearance: Standard pharmacy container. Clearly labeled. Filled Date: 10 / 30 2017 Medication last intake: 11-27-15 at 1220  Pharmacokinetics: Liberation and absorption (onset of action): WNL Distribution (time to peak effect): WNL Metabolism and excretion (duration of action): WNL         Pharmacodynamics: Desired effects: Analgesia: Manuel Knight reports >50% benefit. Functional ability: Patient reports that medication allows him to accomplish basic ADLs Clinically meaningful improvement in function (CMIF): Sustained CMIF goals met Perceived effectiveness: Described as relatively effective, allowing for increase in activities of daily living (ADL) Undesirable effects: Side-effects or Adverse reactions: None reported Monitoring: [place] PMP: Online review of the past 67-monthperiod conducted. Compliant with practice rules and regulations List of all UDS test(s) done:  Lab Results  Component Value Date   TOXASSSELUR FINAL 08/27/2015   SUMMARY FINAL 06/13/2015   Last UDS on record: ToxAssure Select 13  Date Value Ref Range Status  08/27/2015 FINAL  Final    Comment:    ==================================================================== TOXASSURE SELECT 13 (MW) ==================================================================== Test                             Result       Flag       Units Drug Present and Declared for Prescription Verification   Alprazolam                     128          EXPECTED   ng/mg creat   Alpha-hydroxyalprazolam        63           EXPECTED   ng/mg creat    Source of alprazolam is a scheduled prescription medication.    Alpha-hydroxyalprazolam is an expected metabolite of alprazolam.   Oxycodone                      1064         EXPECTED   ng/mg creat   Oxymorphone                    107          EXPECTED   ng/mg creat   Noroxycodone                   576          EXPECTED   ng/mg creat    Sources of oxycodone  include scheduled prescription medications.    Oxymorphone and noroxycodone are expected metabolites of    oxycodone. Oxymorphone is also available as a scheduled    prescription medication. Drug Present not Declared for Prescription Verification   Alcohol, Ethyl                 >0.400       UNEXPECTED g/dL    Sources of ethyl alcohol include alcoholic beverages or as a    fermentation product of glucose; glucose is present in this    specimen.  The high concentration of ethyl alcohol and the    presence of glucose supports fermentation as the source of ethyl    alcohol in this specimen. Drug Absent but Declared for Prescription Verification   Amphetamine                    Not Detected UNEXPECTED ng/mg creat ==================================================================== Test                      Result    Flag   Units  Ref Range   Creatinine              102              mg/dL      >=20 ==================================================================== Declared Medications:  The flagging and interpretation on this report are based on the  following declared medications.  Unexpected results may arise from  inaccuracies in the declared medications.  **Note: The testing scope of this panel includes these medications:  Alprazolam (Xanax)  Amphetamine (Adderall)  Oxycodone (Roxicodone)  **Note: The testing scope of this panel does not include following  reported medications:  Albuterol (ProAir)  Albuterol (Proventil)  Amlodipine (Norvasc)  Aspirin  Atorvastatin (Lipitor)  Benazepril (Lotensin)  Canagliflozin (Invokana)  Carvedilol (Coreg)  Citalopram (Celexa)  Clonidine (Catapres)  Duloxetine (Cymbalta)  Fluticasone (Advair)  Gabapentin  Insulin (Levemir)  Insulin (NovoLog)  Magnesium  Meloxicam (Mobic)  Metformin (Glucophage)  Mupirocin (Bactroban)  Naloxone  Nitroglycerin (Nitrostat)  Pantoprazole (Protonix)  Sacubitril (Entresto)  Salmeterol (Advair)   Tiotropium (Spiriva)  Valsartan (Entresto)  Vitamin D2 (Drisdol) ==================================================================== For clinical consultation, please call [phone]. ====================================================================    UDS interpretation: Compliant          Medication Assessment Form: Reviewed. Patient indicates being compliant with therapy Treatment compliance: Compliant Risk Assessment Profile: Aberrant behavior: See prior evaluations. None observed or detected today Comorbid factors increasing risk of overdose: See prior notes. No additional risks detected today Risk of substance use disorder (SUD): Low Opioid Risk Tool (ORT) Total Score:    Interpretation Table:  Score <3 = Low Risk for SUD  Score between 4-7 = Moderate Risk for SUD  Score >8 = High Risk for Opioid Abuse   Risk Mitigation Strategies:  Patient Counseling: Covered Patient-Prescriber Agreement (PPA): Present and active  Notification to other healthcare providers: Done  Pharmacologic Plan: No change in therapy, at this time  Laboratory Chemistry  Inflammation Markers Lab Results  Component Value Date   ESRSEDRATE 2 06/13/2015   CRP 0.8 06/13/2015   Renal Function Lab Results  Component Value Date   BUN 15 06/13/2015   CREATININE 0.98 06/13/2015   GFRAA >60 06/13/2015   GFRNONAA >60 06/13/2015   Hepatic Function Lab Results  Component Value Date   AST 17 06/13/2015   ALT 19 06/13/2015   ALBUMIN 4.0 06/13/2015   Electrolytes Lab Results  Component Value Date   NA 138 06/13/2015   K 4.6 06/13/2015   CL 107 06/13/2015   CALCIUM 9.4 06/13/2015   MG 1.4 (L) 06/13/2015   Pain Modulating Vitamins Lab Results  Component Value Date   VD25OH 31 02/03/2010   25OHVITD1 25 (L) 06/13/2015   25OHVITD2 <1.0 06/13/2015   25OHVITD3 25 06/13/2015   VITAMINB12 1,002 (H) 06/13/2015   Coagulation Parameters Lab Results  Component Value Date   INR 1.59 (H)  02/06/2013   LABPROT 18.5 (H) 02/06/2013   APTT 40 (H) 02/06/2013   PLT 307 03/15/2015   Cardiovascular Lab Results  Component Value Date   HGB 14.5 03/15/2015   HCT 45.5 03/15/2015   Note: Lab results reviewed.  Recent Diagnostic Imaging Review  Dg C-arm 1-60 Min-no Report  Result Date: 10/08/2015 CLINICAL DATA: Sub-acute chronic pain syndrome C-ARM 1-60 MINUTES Fluoroscopy was utilized by the requesting physician.  No radiographic interpretation.   Note: Imaging results reviewed.  Meds  The patient has a current medication list which includes the following prescription(s): albuterol, alprazolam, amlodipine, amphetamine-dextroamphetamine, aspirin ec, atorvastatin, benazepril, canagliflozin, carvedilol, citalopram,  clonidine, d3 super strength, duloxetine, fluticasone, fluticasone-salmeterol, gabapentin, insulin aspart, insulin pen needle, insulin pen needle, insulin syringe-needle u-100, insulin syringe-needle u-100, levemir flextouch, litetouch pen needles, magnesium oxide, meloxicam, metformin, mupirocin ointment, naloxone hcl, nitroglycerin, oxycodone, pantoprazole, proair hfa, sacubitril-valsartan, sure comfort lancets 30g, tiotropium, and truetrack test.  Current Outpatient Prescriptions on File Prior to Visit  Medication Sig  . albuterol (PROAIR HFA) 108 (90 Base) MCG/ACT inhaler Inhale into the lungs.  Marland Kitchen alprazolam (XANAX) 2 MG tablet Take 1 tablet by mouth 3 (three) times daily as needed.   Marland Kitchen amLODipine (NORVASC) 10 MG tablet Take 10 mg by mouth daily.  Marland Kitchen amphetamine-dextroamphetamine (ADDERALL) 20 MG tablet Take 20 mg by mouth 3 (three) times daily.   Marland Kitchen aspirin EC 81 MG tablet Take 1 tablet (81 mg total) by mouth daily.  Marland Kitchen atorvastatin (LIPITOR) 80 MG tablet Take 1 tablet by mouth daily.  . benazepril (LOTENSIN) 20 MG tablet Take 20 mg by mouth daily.   . Canagliflozin (INVOKANA) 100 MG TABS Take 100 mg by mouth daily.  . carvedilol (COREG) 25 MG tablet Take 25 mg by mouth  2 (two) times daily with a meal.   . citalopram (CELEXA) 20 MG tablet Take 1 tablet by mouth daily.  . cloNIDine (CATAPRES) 0.3 MG tablet Take 0.3 mg by mouth 2 (two) times daily.  . D3 SUPER STRENGTH 2000 units CAPS Take 1 capsule by mouth daily.  . DULoxetine (CYMBALTA) 30 MG capsule 1 tab daily for one day, then 2 tabs daily  . fluticasone (FLONASE) 50 MCG/ACT nasal spray USE TWO SPRAYS IN EACH NOSTRIL TWICE DAILY FOR 7 DAYS, THEN USE TWO SPRAYS ONCE DAILY AS DIRECTED.  Marland Kitchen Fluticasone-Salmeterol (ADVAIR DISKUS) 500-50 MCG/DOSE AEPB Inhale 1 puff into the lungs every 12 (twelve) hours.    . gabapentin (NEURONTIN) 600 MG tablet Take 600 mg by mouth 3 (three) times daily.  . insulin aspart (NOVOLOG FLEXPEN) 100 UNIT/ML FlexPen Inject 0-26 Units into the skin 3 (three) times daily with meals. 20 units three times a day with sliding scale max of 26 units  . Insulin Pen Needle (LITETOUCH PEN NEEDLES) 29G X 12.7MM MISC Use as directed.  . Insulin Pen Needle (PRODIGY INSULIN PEN NEEDLES) 29G X 13MM MISC Use as directed.  . Insulin Syringe-Needle U-100 (B-D INS SYR ULTRAFINE 1CC/31G) 31G X 5/16" 1 ML MISC   . Insulin Syringe-Needle U-100 (B-D INS SYRINGE 0.5CC/31GX5/16) 31G X 5/16" 0.5 ML MISC   . LEVEMIR FLEXTOUCH 100 UNIT/ML Pen INJECT 20 UNITS EVERY MORNING.  . LITETOUCH PEN NEEDLES 29G X 12.7MM MISC   . Magnesium Oxide 500 MG CAPS Take 1 capsule (500 mg total) by mouth 2 (two) times daily at 8 am and 10 pm.  . meloxicam (MOBIC) 15 MG tablet Take 15 mg by mouth 2 (two) times daily.  . metFORMIN (GLUCOPHAGE) 1000 MG tablet Take 1,000 mg by mouth 2 (two) times daily.   . mupirocin ointment (BACTROBAN) 2 % Apply topically.  . naloxone HCl 4 MG/0.1ML LIQD One spray in either nostril once for known/suspected opioid overdose. May repeat every 2-3 minutes in alternating nostril til EMS arrives  . nitroGLYCERIN (NITROSTAT) 0.4 MG SL tablet Place 1 tablet (0.4 mg total) under the tongue every 5 (five) minutes  as needed for chest pain.  . pantoprazole (PROTONIX) 40 MG tablet 1 tablet Two (2) times a day.  Marland Kitchen PROAIR HFA 108 (90 BASE) MCG/ACT inhaler Inhale 2 puffs into the lungs every 4 (four) hours as  needed.  . sacubitril-valsartan (ENTRESTO) 97-103 MG Take 1 tablet by mouth 2 (two) times daily.  . SURE COMFORT LANCETS 30G MISC USE TO CHECK SUGAR THREE TIMES DAILY {AS DIRECTED]  . tiotropium (SPIRIVA) 18 MCG inhalation capsule Place 18 mcg into inhaler and inhale daily.    Angelia Mould TEST test strip USE TO CHECK BLOOD SUGAR THREE TIMES DAILY   No current facility-administered medications on file prior to visit.    ROS  Constitutional: Denies any fever or chills Gastrointestinal: No reported hemesis, hematochezia, vomiting, or acute GI distress Musculoskeletal: Denies any acute onset joint swelling, redness, loss of ROM, or weakness Neurological: No reported episodes of acute onset apraxia, aphasia, dysarthria, agnosia, amnesia, paralysis, loss of coordination, or loss of consciousness  Allergies  Manuel Knight is allergic to lodine [etodolac]; tylenol [acetaminophen]; iodine; and propoxyphene n-acetaminophen.  [place]  Drug: Manuel Knight  reports that he does not use drugs. Alcohol:  reports that he does not drink alcohol. Tobacco:  reports that he quit smoking about 27 years ago. His smoking use included Cigarettes. He started smoking about 48 years ago. He has a 28.50 pack-year smoking history. He has never used smokeless tobacco. Medical:  has a past medical history of Anxiety; Cellulitis (2011); Chronic back pain; Claustrophobia; COPD (chronic obstructive pulmonary disease) ([place]); Diabetes mellitus, type 2 ([place]); Diverticulitis of colon (2008); GERD (gastroesophageal reflux disease); Heart attack; Hyperlipidemia; Hypertension; Obesity; and Pulmonary nodule. Family: family history includes Cancer in his mother; Coronary artery disease in his mother and sister; Diabetes in his sister; Heart attack in  his brother and father.  Past Surgical History:  Procedure Laterality Date  . APPENDECTOMY  2007   gangrenous appendicitis   . CORONARY ARTERY BYPASS GRAFT N/A 02/06/2013   Procedure: CORONARY ARTERY BYPASS GRAFTING (CABG) with TEE;  Surgeon: [name], MD;  Location: [place] [facility];  Service: Open Heart Surgery;  Laterality: N/A;  CABG x three,  using left internal mammary artery and right leg greater saphenous vein harvested endoscopically  . LEFT HEART CATHETERIZATION WITH CORONARY ANGIOGRAM N/A 02/03/2013   Procedure: LEFT HEART CATHETERIZATION WITH CORONARY ANGIOGRAM;  Surgeon: Jettie Booze, MD;  Location: [facility] CATH LAB;  Service: Cardiovascular;  Laterality: N/A;  . TONSILLECTOMY     Constitutional Exam  General appearance: Well nourished, well developed, and well hydrated. In no apparent acute distress Vitals:   11/28/15 1301  BP: 140/76  Pulse: 86  Resp: 18  Temp: 98.2 F (36.8 C)  TempSrc: Oral  SpO2: 98%  Weight: 246 lb (111.6 kg)  Height: '5\' 9"'  (1.753 m)   BMI Assessment: Estimated body mass index is 36.33 kg/m as calculated from the following:   Height as of this encounter: '5\' 9"'  (1.753 m).   Weight as of this encounter: 246 lb (111.6 kg).  BMI interpretation table: BMI level Category Range association with higher incidence of chronic pain  <18 kg/m2 Underweight   18.5-24.9 kg/m2 Ideal body weight   25-29.9 kg/m2 Overweight Increased incidence by 20%  30-34.9 kg/m2 Obese (Class I) Increased incidence by 68%  35-39.9 kg/m2 Severe obesity (Class II) Increased incidence by 136%  >40 kg/m2 Extreme obesity (Class III) Increased incidence by 254%   BMI Readings from Last 4 Encounters:  11/28/15 36.33 kg/m  10/31/15 35.74 kg/m  10/08/15 35.74 kg/m  10/02/15 36.18 kg/m   Wt Readings from Last 4 Encounters:  11/28/15 246 lb (111.6 kg)  10/31/15 242 lb (109.8 kg)  10/08/15 242 lb (109.8 kg)  10/02/15 245 lb (111.1 kg)  Psych/Mental status: Alert, oriented x 3  (person, place, & time) Eyes: PERLA Respiratory: No evidence of acute respiratory distress  Cervical Spine Exam  Inspection: No masses, redness, or swelling Alignment: Symmetrical Functional ROM: Unrestricted ROM Stability: No instability detected Muscle strength & Tone: Functionally intact Sensory: Unimpaired Palpation: Non-contributory  Upper Extremity (UE) Exam    Side: Right upper extremity  Side: Left upper extremity  Inspection: No masses, redness, swelling, or asymmetry  Inspection: No masses, redness, swelling, or asymmetry  Functional ROM: Unrestricted ROM          Functional ROM: Unrestricted ROM          Muscle strength & Tone: Functionally intact  Muscle strength & Tone: Functionally intact  Sensory: Unimpaired  Sensory: Unimpaired  Palpation: Non-contributory  Palpation: Non-contributory   Thoracic Spine Exam  Inspection: No masses, redness, or swelling Alignment: Symmetrical Functional ROM: Unrestricted ROM Stability: No instability detected Sensory: Unimpaired Muscle strength & Tone: Functionally intact Palpation: Non-contributory  Lumbar Spine Exam  Inspection: No masses, redness, or swelling Alignment: Symmetrical Functional ROM: Unrestricted ROM Stability: No instability detected Muscle strength & Tone: Functionally intact Sensory: Unimpaired Palpation: Non-contributory Provocative Tests: Lumbar Hyperextension and rotation test: evaluation deferred today       Patrick's Maneuver: evaluation deferred today              Gait & Posture Assessment  Ambulation: Unassisted Gait: Relatively normal for age and body habitus Posture: WNL   Lower Extremity Exam    Side: Right lower extremity  Side: Left lower extremity  Inspection: No masses, redness, swelling, or asymmetry  Inspection: No masses, redness, swelling, or asymmetry  Functional ROM: Unrestricted ROM          Functional ROM: Unrestricted ROM          Muscle strength & Tone: Functionally intact   Muscle strength & Tone: Functionally intact  Sensory: Unimpaired  Sensory: Unimpaired  Palpation: Non-contributory  Palpation: Non-contributory   Assessment  Primary Diagnosis & Pertinent Problem List: The primary encounter diagnosis was Chronic pain syndrome. Diagnoses of Long term current use of opiate analgesic, Opiate use (90 MME/Day), Chronic pain of both knees, Chronic low back pain (Location of Secondary source of pain) (Bilateral) (L>R), Chronic lower extremity pain (Location of Tertiary source of pain) (Bilateral) (L>R), 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) (L>R), and Lumbar facet syndrome (Location of Secondary source of pain) (Bilateral) (L>R) were also pertinent to this visit.  Visit Diagnosis: 1. Chronic pain syndrome   2. Long term current use of opiate analgesic   3. Opiate use (90 MME/Day)   4. Chronic pain of both knees   5. Chronic low back pain (Location of Secondary source of pain) (Bilateral) (L>R)   6. Chronic lower extremity pain (Location of Tertiary source of pain) (Bilateral) (L>R)   7. 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 Lumbar facet syndrome (Location of Secondary source of pain) (Bilateral) (L>R)    Plan of Care  Pharmacotherapy (Medications Ordered): Meds ordered this encounter  Medications  . oxyCODONE (ROXICODONE) 15 MG immediate release tablet    Sig: Take 1 tablet (15 mg total) by mouth every 6 (six) hours as needed. for pain    Dispense:  120 tablet    Refill:  0    Do not add this medication to the electronic "Automatic Refill" notification system. Patient may have prescription  filled one day early if pharmacy is closed on scheduled refill date. Do not fill until: 12/11/15 To last until: 01/10/16   New Prescriptions   No medications on file   Medications administered today: Manuel Knight had no medications administered during this visit. Lab-work,  procedure(s), and/or referral(s): Orders Placed This Encounter  Procedures  . LUMBAR FACET(MEDIAL BRANCH NERVE BLOCK) MBNB  . SACROILIAC JOINT INJECTINS   Imaging and/or referral(s): None  Interventional therapies: Planned, scheduled, and/or pending:   Diagnostic Bilateral Lumbar Facet Block #2 + Bilateral SI blk.   Considering:   Diagnostic bilateral intra-articular knee injection, without fluoroscopic guidance or IV sedation.  Diagnostic bilateral genicular nerve block under fluoroscopic guidance, with a without sedation.  Possible bilateral genicular nerve radiofrequency ablation under fluoroscopic guidance and IV sedation, the pending on the results of the diagnostic injection.  Diagnostic bilateral lumbar facet block under fluoroscopic guidance and IV sedation.  Possible bilateral lumbar facet radiofrequency ablation under fluoroscopic guidance and IV sedation depending on the results of the diagnostic injection.  Diagnostic left-sided L4-5 lumbar epidural steroid injection under fluoroscopic guidance, with or without IV sedation.  Diagnostic bilateral sacroiliac joint block under fluoroscopic guidance, with or without sedation.  Possible bilateral sacroiliac joint radiofrequency ablation under fluoroscopic guidance and IV sedation, the pending on the results of the diagnostic injection.    Palliative PRN treatment(s):   Diagnostic bilateral intra-articular knee injection, without fluoroscopic guidance or IV sedation.  Diagnostic bilateral genicular nerve block under fluoroscopic guidance, with a without sedation.  Diagnostic bilateral lumbar facet block under fluoroscopic guidance and IV sedation.  Diagnostic left-sided L4-5 lumbar epidural steroid injection under fluoroscopic guidance, with or without IV sedation.  Diagnostic bilateral sacroiliac joint block under fluoroscopic guidance, with or without sedation.    Provider-requested follow-up: Return in about 1 month (around  12/28/2015) for Med-Mgmt, in addition, procedure, (ASAP).  No future appointments. Primary Care Physician: [facility] Location: [facility] Outpatient Pain Management Facility Note by: Kathlen Brunswick. Dossie Arbour, M.D, DABA, DABAPM, DABPM, DABIPP, FIPP  Pain Score Disclaimer: We use the NRS-11 scale. This is a self-reported, subjective measurement of pain severity with only modest accuracy. It is used primarily to identify changes within a particular patient. It must be understood that outpatient pain scales are significantly less accurate that those used for research, where they can be applied under ideal controlled circumstances with minimal exposure to variables. In reality, the score is likely to be a combination of pain intensity and pain affect, where pain affect describes the degree of emotional arousal or changes in action readiness caused by the sensory experience of pain. Factors such as social and work situation, setting, emotional state, anxiety levels, expectation, and prior pain experience may influence pain perception and show large inter-individual differences that may also be affected by time variables.  Patient instructions provided during this appointment: Patient Instructions  Pain Management Discharge Instructions  General Discharge Instructions :  If you need to reach your doctor call: Monday-Friday 8:00 am - 4:00 pm at [phone] or toll free [phone].  After clinic hours [phone] to have operator reach doctor.  Bring all of your medication bottles to all your appointments in the pain clinic.  To cancel or reschedule your appointment with Pain Management please remember to call 24 hours in advance to avoid a fee.  Refer to the educational materials which you have been given on: General Risks, I had my Procedure. Discharge Instructions, Post Sedation.  Post Procedure Instructions:  The drugs you were given will stay in your system until  tomorrow, so for the next 24 hours you should not drive, make any legal decisions or drink any alcoholic beverages.  You may eat anything you prefer, but it is better to start with liquids then soups and crackers, and gradually work up to solid foods.  Please notify your doctor immediately if you have any unusual bleeding, trouble breathing or pain that is not related to your normal pain.  Depending on the type of procedure that was done, some parts of your body may feel week and/or numb.  This usually clears up by tonight or the next day.  Walk with the use of an assistive device or accompanied by an adult for the 24 hours.  You may use ice on the affected area for the first 24 hours.  Put ice in a Ziploc bag and cover with a towel and place against area 15 minutes on 15 minutes off.  You may switch to heat after 24 hours.

## 2015-12-16 ENCOUNTER — Ambulatory Visit: Payer: Medicare Other | Admitting: Pain Medicine

## 2015-12-23 ENCOUNTER — Ambulatory Visit (HOSPITAL_BASED_OUTPATIENT_CLINIC_OR_DEPARTMENT_OTHER): Payer: Medicare Other | Admitting: Pain Medicine

## 2015-12-23 ENCOUNTER — Encounter: Payer: Self-pay | Admitting: Pain Medicine

## 2015-12-23 ENCOUNTER — Ambulatory Visit
Admission: RE | Admit: 2015-12-23 | Discharge: 2015-12-23 | Disposition: A | Discharge status: Home / Self Care | Payer: Medicare Other | Source: Ambulatory Visit | Attending: Pain Medicine | Admitting: Pain Medicine

## 2015-12-23 VITALS — BP 154/82 | HR 71 | Temp 97.6°F | Resp 17 | Ht 69.0 in | Wt 242.0 lb

## 2015-12-23 DIAGNOSIS — M545 Low back pain: Secondary | ICD-10-CM | POA: Diagnosis not present

## 2015-12-23 DIAGNOSIS — G894 Chronic pain syndrome: Secondary | ICD-10-CM | POA: Insufficient documentation

## 2015-12-23 DIAGNOSIS — M47816 Spondylosis without myelopathy or radiculopathy, lumbar region: Secondary | ICD-10-CM

## 2015-12-23 DIAGNOSIS — M533 Sacrococcygeal disorders, not elsewhere classified: Secondary | ICD-10-CM | POA: Insufficient documentation

## 2015-12-23 DIAGNOSIS — G8929 Other chronic pain: Secondary | ICD-10-CM | POA: Diagnosis not present

## 2015-12-23 DIAGNOSIS — M1288 Other specific arthropathies, not elsewhere classified, other specified site: Secondary | ICD-10-CM

## 2015-12-23 MED ORDER — TRIAMCINOLONE ACETONIDE 40 MG/ML IJ SUSP
INTRAMUSCULAR | Status: AC
  Administered 2015-12-23: 11:00:00
  Filled 2015-12-23: qty 1

## 2015-12-23 MED ORDER — ROPIVACAINE HCL 2 MG/ML IJ SOLN
9.0000 mL | Freq: Once | INTRAMUSCULAR | Status: AC
  Administered 2015-12-23: 9 mL
  Filled 2015-12-23: qty 10

## 2015-12-23 MED ORDER — METHYLPREDNISOLONE ACETATE 80 MG/ML IJ SUSP
80.0000 mg | Freq: Once | INTRAMUSCULAR | Status: AC
  Administered 2015-12-23: 80 mg
  Filled 2015-12-23: qty 1

## 2015-12-23 MED ORDER — FENTANYL CITRATE (PF) 100 MCG/2ML IJ SOLN
25.0000 ug | INTRAMUSCULAR | Status: DC | PRN
Start: 2015-12-23 — End: 2016-01-28
  Filled 2015-12-23: qty 2

## 2015-12-23 MED ORDER — OXYCODONE HCL 15 MG PO TABS: 15.0000 mg | ORAL_TABLET | Freq: Four times a day (QID) | ORAL | 0 refills | Status: DC | PRN

## 2015-12-23 MED ORDER — LIDOCAINE HCL (PF) 1 % IJ SOLN
10.0000 mL | Freq: Once | INTRAMUSCULAR | Status: AC
  Administered 2015-12-23: 10 mL

## 2015-12-23 MED ORDER — TRIAMCINOLONE ACETONIDE 40 MG/ML IJ SUSP
40.0000 mg | Freq: Once | INTRAMUSCULAR | Status: AC
  Administered 2015-12-23: 40 mg

## 2015-12-23 MED ORDER — MIDAZOLAM HCL 5 MG/5ML IJ SOLN
1.0000 mg | INTRAMUSCULAR | Status: DC | PRN
  Filled 2015-12-23: qty 5

## 2015-12-23 MED ORDER — METHYLPREDNISOLONE ACETATE 80 MG/ML IJ SUSP
INTRAMUSCULAR | Status: AC
  Administered 2015-12-23: 11:00:00
  Filled 2015-12-23: qty 1

## 2015-12-23 MED ORDER — LACTATED RINGERS IV SOLN
1000.0000 mL | Freq: Once | INTRAVENOUS | Status: AC
  Administered 2015-12-23: 1000 mL via INTRAVENOUS

## 2015-12-23 NOTE — Patient Instructions (Signed)
Pain Management Discharge Instructions  General Discharge Instructions :  If you need to reach your doctor call: Monday-Friday 8:00 am - 4:00 pm at [phone] or toll free [phone].  After clinic hours [phone] to have operator reach doctor.  Bring all of your medication bottles to all your appointments in the pain clinic.  To cancel or reschedule your appointment with Pain Management please remember to call 24 hours in advance to avoid a fee.  Refer to the educational materials which you have been given on: General Risks, I had my Procedure. Discharge Instructions, Post Sedation.  Post Procedure Instructions:  The drugs you were given will stay in your system until tomorrow, so for the next 24 hours you should not drive, make any legal decisions or drink any alcoholic beverages.  You may eat anything you prefer, but it is better to start with liquids then soups and crackers, and gradually work up to solid foods.  Please notify your doctor immediately if you have any unusual bleeding, trouble breathing or pain that is not related to your normal pain.  Depending on the type of procedure that was done, some parts of your body may feel week and/or numb.  This usually clears up by tonight or the next day.  Walk with the use of an assistive device or accompanied by an adult for the 24 hours.  You may use ice on the affected area for the first 24 hours.  Put ice in a Ziploc bag and cover with a towel and place against area 15 minutes on 15 minutes off.  You may switch to heat after 24 hours.Sacroiliac (SI) Joint Injection Patient Information  Description: The sacroiliac joint connects the scrum (very low back and tailbone) to the ilium (a pelvic bone which also forms half of the hip joint).  Normally this joint experiences very little motion.  When this joint becomes inflamed or unstable low back and or hip and pelvis pain may result.  Injection of this joint with local anesthetics  (numbing medicines) and steroids can provide diagnostic information and reduce pain.  This injection is performed with the aid of x-ray guidance into the tailbone area while you are lying on your stomach.   You may experience an electrical sensation down the leg while this is being done.  You may also experience numbness.  We also may ask if we are reproducing your normal pain during the injection.  Conditions which may be treated SI injection:   Low back, buttock, hip or leg pain  Preparation for the Injection:  1. Do not eat any solid food or dairy products within 8 hours of your appointment.  2. You may drink clear liquids up to 3 hours before appointment.  Clear liquids include water, black coffee, juice or soda.  No milk or cream please. 3. You may take your regular medications, including pain medications with a sip of water before your appointment.  Diabetics should hold regular insulin (if take separately) and take 1/2 normal NPH dose the morning of the procedure.  Carry some sugar containing items with you to your appointment. 4. A driver must accompany you and be prepared to drive you home after your procedure. 5. Bring all of your current medications with you. 6. An IV may be inserted and sedation may be given at the discretion of the physician. 7. A blood pressure cuff, EKG and other monitors will often be applied during the procedure.  Some patients may need to have extra oxygen   administered for a short period.  8. You will be asked to provide medical information, including your allergies, prior to the procedure.  We must know immediately if you are taking blood thinners (like Coumadin/Warfarin) or if you are allergic to IV iodine contrast (dye).  We must know if you could possible be pregnant.  Possible side effects:   Bleeding from needle site  Infection (rare, may require surgery)  Nerve injury (rare)  Numbness & tingling (temporary)  A brief convulsion or  seizure  Light-headedness (temporary)  Pain at injection site (several days)  Decreased blood pressure (temporary)  Weakness in the leg (temporary)   Call if you experience:   New onset weakness or numbness of an extremity below the injection site that last more than 8 hours.  Hives or difficulty breathing ( go to the emergency room)  Inflammation or drainage at the injection site  Any new symptoms which are concerning to you  Please note:  Although the local anesthetic injected can often make your back/ hip/ buttock/ leg feel good for several hours after the injections, the pain will likely return.  It takes 3-7 days for steroids to work in the sacroiliac area.  You may not notice any pain relief for at least that one week.  If effective, we will often do a series of three injections spaced 3-6 weeks apart to maximally decrease your pain.  After the initial series, we generally will wait some months before a repeat injection of the same type.  If you have any questions, please call [phone] [facility]  Facet Joint Block The facet joints connect the bones of the spine (vertebrae). They make it possible for you to bend, twist, and make other movements with your spine. They also prevent you from overbending, overtwisting, and making other excessive movements.  A facet joint block is a procedure where a numbing medicine (anesthetic) is injected into a facet joint. Often, a type of anti-inflammatory medicine called a steroid is also injected. A facet joint block may be done for two reasons:   Diagnosis. A facet joint block may be done as a test to see whether neck or back pain is caused by a worn-down or infected facet joint. If the pain gets better after a facet joint block, it means the pain is probably coming from the facet joint. If the pain does not get better, it means the pain is probably not coming from the facet joint.   Therapy. A  facet joint block may be done to relieve neck or back pain caused by a facet joint. A facet joint block is only done as a therapy if the pain does not improve with medicine, exercise programs, physical therapy, and other forms of pain management. LET [facility] CARE PROVIDER KNOW ABOUT:   Any allergies you have.   All medicines you are taking, including vitamins, herbs, eyedrops, and over-the-counter medicines and creams.   Previous problems you or members of your family have had with the use of anesthetics.   Any blood disorders you have had.   Other health problems you have. RISKS AND COMPLICATIONS Generally, having a facet joint block is safe. However, as with any procedure, complications can occur. Possible complications associated with having a facet joint block include:   Bleeding.   Injury to a nerve near the injection site.   Pain at the injection site.   Weakness or numbness in areas controlled by nerves near the injection  site.   Infection.   Temporary fluid retention.   Allergic reaction to anesthetics or medicines used during the procedure. BEFORE THE PROCEDURE   Follow your health care provider's instructions if you are taking dietary supplements or medicines. You may need to stop taking them or reduce your dosage.   Do not take any new dietary supplements or medicines without asking your health care provider first.   Follow your health care provider's instructions about eating and drinking before the procedure. You may need to stop eating and drinking several hours before the procedure.   Arrange to have an adult drive you home after the procedure. PROCEDURE  You may need to remove your clothing and dress in an open-back gown so that your health care provider can access your spine.   The procedure will be done while you are lying on an X-ray table. Most of the time you will be asked to lie on your stomach, but you may be asked to lie in a  different position if an injection will be made in your neck.   Special machines will be used to monitor your oxygen levels, heart rate, and blood pressure.   If an injection will be made in your neck, an intravenous (IV) tube will be inserted into one of your veins. Fluids and medicine will flow directly into your body through the IV tube.   The area over the facet joint where the injection will be made will be cleaned with an antiseptic soap. The surrounding skin will be covered with sterile drapes.   An anesthetic will be applied to your skin to make the injection area numb. You may feel a temporary stinging or burning sensation.   A video X-ray machine will be used to locate the joint. A contrast dye may be injected into the facet joint area to help with locating the joint.   When the joint is located, an anesthetic medicine will be injected into the joint through the needle.   Your health care provider will ask you whether you feel pain relief. If you do feel relief, a steroid may be injected to provide pain relief for a longer period of time. If you do not feel relief or feel only partial relief, additional injections of an anesthetic may be made in other facet joints.   The needle will be removed, the skin will be cleansed, and bandages will be applied.  AFTER THE PROCEDURE   You will be observed for 15-30 minutes before being allowed to go home. Do not drive. Have an adult drive you or take a taxi or public transportation instead.   If you feel pain relief, the pain will return in several hours or days when the anesthetic wears off.   You may feel pain relief 2-14 days after the procedure. The amount of time this relief lasts varies from person to person.   It is normal to feel some tenderness over the injected area(s) for 2 days following the procedure.   If you have diabetes, you may have a temporary increase in blood sugar. This information is not intended to  replace advice given to you by your health care provider. Make sure you discuss any questions you have with your health care provider. Document Released: 05/20/2006 Document Revised: 01/19/2014 Document Reviewed: 09/24/2014 Elsevier Interactive Patient Education  2017 [place] After Refer to this sheet in the next few weeks. These instructions provide you with information on caring for yourself  after your procedure. Your health care provider may also give you more specific instructions. Your treatment has been planned according to current medical practices, but problems sometimes occur. Call your health care provider if you have any problems or questions after your procedure. HOME CARE INSTRUCTIONS   Keep track of the amount of pain relief you feel and how long it lasts.  Limit pain medicine within the first 4-6 hours after the procedure as directed by your health care provider.  Resume taking dietary supplements and medicines as directed by your health care provider.  You may resume your regular diet.  Do not apply heat near or over the injection site(s) for 24 hours.   Do not take a bath or soak in water (such as a pool or lake) for 24 hours.  Do not drive for 24 hours unless approved by your health care provider.  Avoid strenuous activity for 24 hours.  Remove your bandages the morning after the procedure.   If the injection site is tender, applying an ice pack may relieve some tenderness. To do this:  Put ice in a bag.  Place a towel between your skin and the bag.  Leave the ice on for 15-20 minutes, 3-4 times a day.  Keep follow-up appointments as directed by your health care provider. SEEK MEDICAL CARE IF:   Your pain is not controlled by your medicines.   There is drainage from the injection site.   There is significant bleeding or swelling at the injection site.  You have diabetes and your blood sugar is above 180 mg/dL. SEEK  IMMEDIATE MEDICAL CARE IF:   You develop a fever of 101F (38.3C) or greater.   You have worsening pain or swelling around the injection site.   You have red streaking around the injection site.   You develop severe pain that is not controlled by your medicines.   You develop a headache, stiff neck, nausea, or vomiting.   Your eyes become very sensitive to light.   You have weakness, paralysis, or tingling in your arms or legs that was not present before the procedure.   You develop difficulty urinating or breathing.  This information is not intended to replace advice given to you by your health care provider. Make sure you discuss any questions you have with your health care provider. Document Released: 12/16/2011 Document Revised: 01/19/2014 Document Reviewed: 09/24/2014 Elsevier Interactive Patient Education  2017 [employer].

## 2015-12-23 NOTE — Progress Notes (Signed)
Patient's Name: Manuel Knight  MRN: [id number]  Referring Provider: Milinda Pointer, MD  DOB: [birth year]  PCP: [facility]  DOS: 12/23/2015  Note by: Kathlen Brunswick. Dossie Arbour, MD  Service setting: Ambulatory outpatient  Location: [facility] (AMB) Pain Management Facility  Visit type: Procedure  Specialty: Interventional Pain Management  Patient type: Established   Primary Reason for Visit: Interventional Pain Management Treatment. CC: Back Pain (lower left)  Procedure:  Anesthesia, Analgesia, Anxiolysis:  Procedure #1: Type: Diagnostic Medial Branch Facet Block Region: Lumbar Level: L2, L3, L4, L5, & S1 Medial Branch Level(s) Laterality: Bilateral  Procedure #2: Type: Diagnostic Sacroiliac Joint Block Region: Posterior Lumbosacral Level: PSIS (Posterior Superior Iliac Spine) Sacroiliac Joint Laterality: Bilateral  Type: Local Anesthesia with Moderate (Conscious) Sedation Local Anesthetic: Lidocaine 1% Route: Intravenous (IV) IV Access: Secured Sedation: Meaningful verbal contact was maintained at all times during the procedure  Indication(s): Analgesia and Anxiety  Indications: 1. Chronic low back pain (Location of Secondary source of pain) (Bilateral) (L>R)   2. Lumbar facet syndrome (Location of Secondary source of pain) (Bilateral) (L>R)   3. Chronic pain syndrome   4. 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) (L>R)    Pain Score: Pre-procedure: 5 /10 Post-procedure: 1 /10  Pre-Procedure Assessment:  Manuel Knight is a 55 y.o. (year old), male patient, seen today for interventional treatment. 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 His primarily concern today is the Back Pain (lower left) The primary encounter diagnosis was Chronic low back pain (Location of  Secondary source of pain) (Bilateral) (L>R). Diagnoses of Lumbar facet syndrome (Location of Secondary source of pain) (Bilateral) (L>R), Chronic pain syndrome, and 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) (L>R) were also pertinent to this visit.  Pain Type: Chronic pain Pain Location: Back Pain Orientation: Lower, Left Pain Descriptors / Indicators: Aching Pain Frequency: Constant  Date of Last Visit: 11/28/15 Service Provided on Last Visit: Med Refill  Coagulation Parameters Lab Results  Component Value Date   INR 1.59 (H) 02/06/2013   LABPROT 18.5 (H) 02/06/2013   APTT 40 (H) 02/06/2013   PLT 307 03/15/2015   Verification of the correct person, correct site (including marking of site), and correct procedure were performed and confirmed by the patient.  Consent: Before the procedure and under the influence of no sedative(s), amnesic(s), or anxiolytics, the patient was informed of the treatment options, risks and possible complications. To fulfill our ethical and legal obligations, as recommended by the American Medical Association's Code of Ethics, I have informed the patient of my clinical impression; the nature and purpose of the treatment or procedure; the risks, benefits, and possible complications of the intervention; the alternatives, including doing nothing; the risk(s) and benefit(s) of the alternative treatment(s) or procedure(s); and the risk(s) and benefit(s) of doing nothing. The patient was provided information about the general risks and possible complications associated with the procedure. These may include, but are not limited to: failure to achieve desired goals, infection, bleeding, organ or nerve damage, allergic reactions, paralysis, and death. In addition, the patient was informed of those risks and complications associated to Spine-related procedures, such as failure to decrease pain; infection (i.e.: Meningitis, epidural or  intraspinal abscess); bleeding (i.e.: epidural hematoma, subarachnoid hemorrhage, or any other type of intraspinal or peri-dural bleeding); organ or nerve damage (i.e.:  Any type of peripheral nerve, nerve root, or spinal cord injury) with subsequent damage to sensory, motor, and/or autonomic systems, resulting in permanent pain, numbness, and/or weakness of one or several areas of the body; allergic reactions; (i.e.: anaphylactic reaction); and/or death. Furthermore, the patient was informed of those risks and complications associated with the medications. These include, but are not limited to: allergic reactions (i.e.: anaphylactic or anaphylactoid reaction(s)); adrenal axis suppression; blood sugar elevation that in diabetics may result in ketoacidosis or comma; water retention that in patients with history of congestive heart failure may result in shortness of breath, pulmonary edema, and decompensation with resultant heart failure; weight gain; swelling or edema; medication-induced neural toxicity; particulate matter embolism and blood vessel occlusion with resultant organ, and/or nervous system infarction; and/or aseptic necrosis of one or more joints. Finally, the patient was informed that Medicine is not an exact science; therefore, there is also the possibility of unforeseen or unpredictable risks and/or possible complications that may result in a catastrophic outcome. The patient indicated having understood very clearly. We have given the patient no guarantees and we have made no promises. Enough time was given to the patient to ask questions, all of which were answered to the patient's satisfaction. Manuel Knight has indicated that he wanted to continue with the procedure.  Consent Attestation: I, the ordering provider, attest that I have discussed with the patient the benefits, risks, side-effects, alternatives, likelihood of achieving goals, and potential problems during recovery for the procedure  that I have provided informed consent.  Pre-Procedure Preparation:  Safety Precautions: Allergies reviewed. The patient was asked about blood thinners, or active infections, both of which were denied. The patient was asked to confirm the procedure and laterality, before marking the site, and again before commencing the procedure. Appropriate site, procedure, and patient were confirmed by following the Joint Commission's Universal Protocol (UP.01.01.01), in the form of a "Time Out". The patient was asked to participate by confirming the accuracy of the "Time Out" information. Patient was assessed for positional comfort and pressure points before starting the procedure. Allergies: He is allergic to lodine [etodolac]; tylenol [acetaminophen]; iodine; and propoxyphene n-acetaminophen. Allergy Precautions: None required Infection Control Precautions: Sterile technique used. Standard Universal Precautions were taken as recommended by the Department of [facility] for Disease Control and Prevention (CDC). Standard pre-surgical skin prep was conducted. Respiratory hygiene and cough etiquette was practiced. Hand hygiene observed. Safe injection practices and needle disposal techniques followed. SDV (single dose vial) medications used. Medications properly checked for expiration dates and contaminants. Personal protective equipment (PPE) used as per protocol. Monitoring:  As per clinic protocol. Vitals:   12/23/15 1157 12/23/15 1207 12/23/15 1216 12/23/15 1217  BP: 137/76 (!) 141/84  (!) 154/82  Pulse: 78 77  71  Resp: 16 18  17   Temp:   97.6 F (36.4 C)   TempSrc:   Tympanic   SpO2: 96%   96%  Weight:      Height:      Calculated BMI: Body mass index is 35.74 kg/m. Time-out: "Time-out" completed before starting procedure, as per protocol.  Imaging Review  Lumbosacral Imaging: Lumbar MR wo contrast:  Results for orders placed during the hospital encounter of 06/21/15  MR Lumbar Spine Wo  Contrast   Narrative CLINICAL DATA:  Low back pain radiating to both knees for approximately 10 years. No known injury.  EXAM: MRI LUMBAR SPINE WITHOUT CONTRAST  TECHNIQUE: Multiplanar, multisequence MR imaging of the lumbar spine was performed.  No intravenous contrast was administered.  COMPARISON:  MRI lumbar spine 06/17/2010.  FINDINGS: Segmentation:  Unremarkable.  Alignment:  Normal.  Vertebrae: No fracture or marrow signal abnormality. Small Schmorl's nodes in the superior endplates of L1, L2 and L3 are unchanged.  Conus medullaris: Extends to the T12-L1 level and appears normal.  Paraspinal and other soft tissues: Unremarkable.  Disc levels: T12-L1 is imaged in the sagittal plane only and negative.  L1-2: Negative.  L2-3: Negative.  L3-4: Negative.  L4-5: Mild facet degenerative change and a minimal disc bulge without central canal or foraminal stenosis. The appearance is stable.  L5-S1: Shallow, broad-based central and eccentric to the left protrusion without central canal or foraminal stenosis. The appearance is unchanged.  IMPRESSION: No change in mild lower lumbar degenerative disease without central canal or foraminal narrowing.   Electronically Signed   By: Inge Rise M.D.   On: 06/21/2015 10:10    Lumbar DG (Complete) 4+V:  Results for orders placed during the hospital encounter of 06/17/10  DG Lumbar Spine Complete   Narrative *RADIOLOGY REPORT*  Clinical Data: Low back pain post fall a couple weeks ago  LUMBAR SPINE - COMPLETE 4+ VIEW  Comparison: None  Findings: Five non-rib bearing lumbar vertebrae. Vertebral body and disc space heights maintained. No fracture, subluxation or bone destruction. Tiny anterior spurs at L3-L4 disc space. Facet degenerative changes lower lumbar spine. No spondylolysis. SI joints symmetric. Very minimal levoconvex scoliosis. Prostatic calcifications noted. Minimal scattered atherosclerotic  calcifications.  IMPRESSION: Minimal degenerative disc and facet disease changes of lumbar spine as above.  Original Report Authenticated By: Burnetta Sabin, M.D.   Lumbar DG Bending views:  Results for orders placed during the hospital encounter of 06/13/15  DG Lumbar Spine Complete W/Bend   Narrative CLINICAL DATA:  History of multiple falls over the last 2 months, arthritis  EXAM: LUMBAR SPINE - COMPLETE WITH BENDING VIEWS  COMPARISON:  None.  FINDINGS: The lumbar vertebrae are in normal alignment. Very slight loss of disc space is noted at L3-4 with minimal anterior osteophyte formation. No compression deformity is seen. The remainder of intervertebral did disc spaces appear normal. The SI joints are corticated. The bowel gas pattern is nonspecific. Through flexion and extension there is only slightly limited range of motion with no malalignment noted.  IMPRESSION: 1. Normal alignment with minimally decreased disc space at L3-4. 2. Only slightly limited range of motion through flexion and extension.   Electronically Signed   By: Ivar Drape M.D.   On: 06/13/2015 14:13    Description of Procedure #1 Process:   Time-out: "Time-out" completed before starting procedure, as per protocol. Position: Prone Target Area: For Lumbar Facet blocks, the target is the groove formed by the junction of the transverse process and superior articular process. For the L5 dorsal ramus, the target is the notch between superior articular process and sacral ala. For the S1 dorsal ramus, the target is the superior and lateral edge of the posterior S1 Sacral foramen. Approach: Paramedial approach. Area Prepped: Entire Posterior Lumbosacral Region Prepping solution: ChloraPrep (2% chlorhexidine gluconate and 70% isopropyl alcohol) Safety Precautions: Aspiration looking for blood return was conducted prior to all injections. At no point did we inject any substances, as a needle was being  advanced. No attempts were made at seeking any paresthesias. Safe injection practices and needle disposal techniques used. Medications properly checked for expiration dates. SDV (single dose vial) medications used.  Description of the Procedure: Protocol guidelines were followed.  The patient was placed in position over the fluoroscopy table. The target area was identified and the area prepped in the usual manner. Skin desensitized using vapocoolant spray. Skin & deeper tissues infiltrated with local anesthetic. Appropriate amount of time allowed to pass for local anesthetics to take effect. The procedure needle was introduced through the skin, ipsilateral to the reported pain, and advanced to the target area. Employing the "Medial Branch Technique", the needles were advanced to the angle made by the superior and medial portion of the transverse process, and the lateral and inferior portion of the superior articulating process of the targeted vertebral bodies. This area is known as "Burton's Eye" or the "Eye of the [place] Dog". A procedure needle was introduced through the skin, and this time advanced to the angle made by the superior and medial border of the sacral ala, and the lateral border of the S1 vertebral body. This last needle was later repositioned at the superior and lateral border of the posterior S1 foramen. Negative aspiration confirmed. Solution injected in intermittent fashion, asking for systemic symptoms every 0.5cc of injectate. The needles were then removed and the area cleansed, making sure to leave some of the prepping solution back to take advantage of its long term bactericidal properties. Materials & Medications:  Needle(s) Used: 22g - 3.5" Spinal Needle(s)  Description of Procedure # 2 Process:   Time-out: "Time-out" completed before starting procedure, as per protocol. Position: Prone Target Area: For upper sacroiliac joint block(s), the target is the superior and posterior margin  of the sacroiliac joint. Approach: Ipsilateral approach. Area Prepped: Entire Posterior Lumbosacral Region Prepping solution: Duraprep (Iodine Povacrylex [0.7% available Iodine] and Isopropyl Alcohol, 74% w/w) Safety Precautions: Aspiration looking for blood return was conducted prior to all injections. At no point did we inject any substances, as a needle was being advanced. No attempts were made at seeking any paresthesias. Safe injection practices and needle disposal techniques used. Medications properly checked for expiration dates. SDV (single dose vial) medications used. Description of the Procedure: Protocol guidelines were followed. The patient was placed in position over the fluoroscopy table. The target area was identified and the area prepped in the usual manner. Skin desensitized using vapocoolant spray. Skin & deeper tissues infiltrated with local anesthetic. Appropriate amount of time allowed to pass for local anesthetics to take effect. The procedure needle was advanced under fluoroscopic guidance into the sacroiliac joint until a firm endpoint was obtained. Proper needle placement secured. Negative aspiration confirmed. Solution injected in intermittent fashion, asking for systemic symptoms every 0.5cc of injectate. The needles were then removed and the area cleansed, making sure to leave some of the prepping solution back to take advantage of its long term bactericidal properties. EBL: None Materials & Medications:  Needle(s) Type: Epidural needle Gauge: 22G Length: 3.5-in Medication(s): We administered lactated ringers, methylPREDNISolone acetate, ropivacaine (PF) 2 mg/mL (0.2%), lidocaine (PF), triamcinolone acetonide, lidocaine (PF), ropivacaine (PF) 2 mg/mL (0.2%), triamcinolone acetonide, ropivacaine (PF) 2 mg/mL (0.2%), methylPREDNISolone acetate, triamcinolone acetonide, and triamcinolone acetonide. Please see chart orders for dosing details.  Imaging Guidance (Spinal):  Type of  Imaging Technique: Fluoroscopy Guidance (Spinal) Indication(s): Assistance in needle guidance and placement for procedures requiring needle placement in or near specific anatomical locations not easily accessible without such assistance. Exposure Time: Please see nurses notes. Contrast: None used. Fluoroscopic Guidance: I was personally present during the use of fluoroscopy. "Tunnel Vision Technique" used to obtain the best possible view of the target area. Parallax error corrected  before commencing the procedure. "Direction-depth-direction" technique used to introduce the needle under continuous pulsed fluoroscopy. Once target was reached, antero-posterior, oblique, and lateral fluoroscopic projection used confirm needle placement in all planes. Images permanently stored in EMR. Interpretation: No contrast injected. I personally interpreted the imaging intraoperatively. Adequate needle placement confirmed in multiple planes. Permanent images saved into the patient's record.  Antibiotic Prophylaxis:  Indication(s): No indications identified. Type:  Antibiotics Given (last 72 hours)    None      Post-operative Assessment:  Complications: No immediate post-treatment complications observed by team, or reported by patient. Disposition: The patient tolerated the entire procedure well. A repeat set of vitals were taken after the procedure and the patient was kept under observation following institutional policy, for this type of procedure. Post-procedural neurological assessment was performed, showing return to baseline, prior to discharge. The patient was provided with post-procedure discharge instructions, including a section on how to identify potential problems. Should any problems arise concerning this procedure, the patient was given instructions to immediately contact us, at any time, without hesitation. In any case, we plan to contact the patient by telephone for a follow-up status report regarding  this interventional procedure. Comments:  No additional relevant information.  Plan of Care  Discharge to: Discharge home  Medications ordered for procedure: Meds ordered this encounter  Medications  . oxyCODONE (ROXICODONE) 15 MG immediate release tablet    Sig: Take 1 tablet (15 mg total) by mouth every 6 (six) hours as needed. for pain    Dispense:  120 tablet    Refill:  0    Do not add this medication to the electronic "Automatic Refill" notification system. Patient may have prescription filled one day early if pharmacy is closed on scheduled refill date. Do not fill until: 01/10/16 To last until: 02/09/16  . fentaNYL (SUBLIMAZE) injection 25-50 mcg    Make sure Narcan is available in the pyxis when using this medication. In the event of respiratory depression (RR< 8/min): Titrate NARCAN (naloxone) in increments of 0.1 to 0.2 mg IV at 2-3 minute intervals, until desired degree of reversal.  . lactated ringers infusion 1,000 mL  . midazolam (VERSED) 5 MG/5ML injection 1-2 mg    Make sure Flumazenil is available in the pyxis when using this medication. If oversedation occurs, administer 0.2 mg IV over 15 sec. If after 45 sec no response, administer 0.2 mg again over 1 min; may repeat at 1 min intervals; not to exceed 4 doses (1 mg)  . methylPREDNISolone acetate (DEPO-MEDROL) injection 80 mg  . ropivacaine (PF) 2 mg/mL (0.2%) (NAROPIN) injection 9 mL  . lidocaine (PF) (XYLOCAINE) 1 % injection 10 mL  . triamcinolone acetonide (KENALOG-40) injection 40 mg  . lidocaine (PF) (XYLOCAINE) 1 % injection 10 mL  . ropivacaine (PF) 2 mg/mL (0.2%) (NAROPIN) injection 9 mL  . triamcinolone acetonide (KENALOG-40) injection 40 mg  . ropivacaine (PF) 2 mg/mL (0.2%) (NAROPIN) injection 9 mL  . methylPREDNISolone acetate (DEPO-MEDROL) 80 MG/ML injection    Willeen Cass L: cabinet override  . triamcinolone acetonide (KENALOG-40) 40 MG/ML injection    Willeen Cass L: cabinet override  .  triamcinolone acetonide (KENALOG-40) 40 MG/ML injection    Willeen Cass L: cabinet override   Medications administered: (For more details, see medical record) We administered lactated ringers, methylPREDNISolone acetate, ropivacaine (PF) 2 mg/mL (0.2%), lidocaine (PF), triamcinolone acetonide, lidocaine (PF), ropivacaine (PF) 2 mg/mL (0.2%), triamcinolone acetonide, ropivacaine (PF) 2 mg/mL (0.2%), methylPREDNISolone acetate, triamcinolone acetonide, and triamcinolone  acetonide. Lab-work, Procedure(s), & Referral(s) Ordered: Orders Placed This Encounter  Procedures  . SACROILIAC JOINT INJECTINS  . DG C-Arm 1-60 Min-No Report   Imaging Ordered: Results for orders placed in visit on 10/08/15  DG C-Arm 1-60 Min-No Report   Narrative CLINICAL DATA: Sub-acute chronic pain syndrome   C-ARM 1-60 MINUTES  Fluoroscopy was utilized by the requesting physician.  No radiographic  interpretation.     New Prescriptions   No medications on file   Physician-requested Follow-up:  Return in about 2 weeks (around 01/06/2016) for Post-Procedure evaluation.  Future Appointments Date Time Provider [place]  12/31/2015 2:00 PM Milinda Pointer, MD [facility] None   Primary Care Physician: [facility] Location: [facility] Management Facility Note by: Kathlen Brunswick. Dossie Arbour, M.D, DABA, DABAPM, DABPM, DABIPP, FIPP Date: 12/23/15; Time: 12:52 PM  Disclaimer:  Medicine is not an exact science. The only guarantee in medicine is that nothing is guaranteed. It is important to note that the decision to proceed with this intervention was based on the information collected from the patient. The Data and conclusions were drawn from the patient's questionnaire, the interview, and the physical examination. Because the information was provided in large part by the patient, it cannot be guaranteed that it has not been purposely or unconsciously manipulated. Every effort  has been made to obtain as much relevant data as possible for this evaluation. It is important to note that the conclusions that lead to this procedure are derived in large part from the available data. Always take into account that the treatment will also be dependent on availability of resources and existing treatment guidelines, considered by other Pain Management Practitioners as being common knowledge and practice, at the time of the intervention. For Medico-Legal purposes, it is also important to point out that variation in procedural techniques and pharmacological choices are the acceptable norm. The indications, contraindications, technique, and results of the above procedure should only be interpreted and judged by a Board-Certified Interventional Pain Specialist with extensive familiarity and expertise in the same exact procedure and technique. Attempts at providing opinions without similar or greater experience and expertise than that of the treating physician will be considered as inappropriate and unethical, and shall result in a formal complaint to the state medical board and applicable specialty societies.  Instructions provided at this appointment: Patient Instructions  Pain Management Discharge Instructions  General Discharge Instructions :  If you need to reach your doctor call: Monday-Friday 8:00 am - 4:00 pm at [phone] or toll free [phone].  After clinic hours [phone] to have operator reach doctor.  Bring all of your medication bottles to all your appointments in the pain clinic.  To cancel or reschedule your appointment with Pain Management please remember to call 24 hours in advance to avoid a fee.  Refer to the educational materials which you have been given on: General Risks, I had my Procedure. Discharge Instructions, Post Sedation.  Post Procedure Instructions:  The drugs you were given will stay in your system until tomorrow, so for the next 24 hours you  should not drive, make any legal decisions or drink any alcoholic beverages.  You may eat anything you prefer, but it is better to start with liquids then soups and crackers, and gradually work up to solid foods.  Please notify your doctor immediately if you have any unusual bleeding, trouble breathing or pain that is not related to your normal pain.  Depending on the type of  procedure that was done, some parts of your body may feel week and/or numb.  This usually clears up by tonight or the next day.  Walk with the use of an assistive device or accompanied by an adult for the 24 hours.  You may use ice on the affected area for the first 24 hours.  Put ice in a Ziploc bag and cover with a towel and place against area 15 minutes on 15 minutes off.  You may switch to heat after 24 hours.Sacroiliac (SI) Joint Injection Patient Information  Description: The sacroiliac joint connects the scrum (very low back and tailbone) to the ilium (a pelvic bone which also forms half of the hip joint).  Normally this joint experiences very little motion.  When this joint becomes inflamed or unstable low back and or hip and pelvis pain may result.  Injection of this joint with local anesthetics (numbing medicines) and steroids can provide diagnostic information and reduce pain.  This injection is performed with the aid of x-ray guidance into the tailbone area while you are lying on your stomach.   You may experience an electrical sensation down the leg while this is being done.  You may also experience numbness.  We also may ask if we are reproducing your normal pain during the injection.  Conditions which may be treated SI injection:   Low back, buttock, hip or leg pain  Preparation for the Injection:  1. Do not eat any solid food or dairy products within 8 hours of your appointment.  2. You may drink clear liquids up to 3 hours before appointment.  Clear liquids include water, black coffee, juice or soda.  No  milk or cream please. 3. You may take your regular medications, including pain medications with a sip of water before your appointment.  Diabetics should hold regular insulin (if take separately) and take 1/2 normal NPH dose the morning of the procedure.  Carry some sugar containing items with you to your appointment. 4. A driver must accompany you and be prepared to drive you home after your procedure. 5. Bring all of your current medications with you. 6. An IV may be inserted and sedation may be given at the discretion of the physician. 7. A blood pressure cuff, EKG and other monitors will often be applied during the procedure.  Some patients may need to have extra oxygen administered for a short period.  8. You will be asked to provide medical information, including your allergies, prior to the procedure.  We must know immediately if you are taking blood thinners (like Coumadin/Warfarin) or if you are allergic to IV iodine contrast (dye).  We must know if you could possible be pregnant.  Possible side effects:   Bleeding from needle site  Infection (rare, may require surgery)  Nerve injury (rare)  Numbness & tingling (temporary)  A brief convulsion or seizure  Light-headedness (temporary)  Pain at injection site (several days)  Decreased blood pressure (temporary)  Weakness in the leg (temporary)   Call if you experience:   New onset weakness or numbness of an extremity below the injection site that last more than 8 hours.  Hives or difficulty breathing ( go to the emergency room)  Inflammation or drainage at the injection site  Any new symptoms which are concerning to you  Please note:  Although the local anesthetic injected can often make your back/ hip/ buttock/ leg feel good for several hours after the injections, the pain will likely return.  It takes 3-7 days for steroids to work in the sacroiliac area.  You may not notice any pain relief for at least that one  week.  If effective, we will often do a series of three injections spaced 3-6 weeks apart to maximally decrease your pain.  After the initial series, we generally will wait some months before a repeat injection of the same type.  If you have any questions, please call [phone] [facility]  Facet Joint Block The facet joints connect the bones of the spine (vertebrae). They make it possible for you to bend, twist, and make other movements with your spine. They also prevent you from overbending, overtwisting, and making other excessive movements.  A facet joint block is a procedure where a numbing medicine (anesthetic) is injected into a facet joint. Often, a type of anti-inflammatory medicine called a steroid is also injected. A facet joint block may be done for two reasons:   Diagnosis. A facet joint block may be done as a test to see whether neck or back pain is caused by a worn-down or infected facet joint. If the pain gets better after a facet joint block, it means the pain is probably coming from the facet joint. If the pain does not get better, it means the pain is probably not coming from the facet joint.   Therapy. A facet joint block may be done to relieve neck or back pain caused by a facet joint. A facet joint block is only done as a therapy if the pain does not improve with medicine, exercise programs, physical therapy, and other forms of pain management. LET [facility] CARE PROVIDER KNOW ABOUT:   Any allergies you have.   All medicines you are taking, including vitamins, herbs, eyedrops, and over-the-counter medicines and creams.   Previous problems you or members of your family have had with the use of anesthetics.   Any blood disorders you have had.   Other health problems you have. RISKS AND COMPLICATIONS Generally, having a facet joint block is safe. However, as with any procedure, complications can occur. Possible complications  associated with having a facet joint block include:   Bleeding.   Injury to a nerve near the injection site.   Pain at the injection site.   Weakness or numbness in areas controlled by nerves near the injection site.   Infection.   Temporary fluid retention.   Allergic reaction to anesthetics or medicines used during the procedure. BEFORE THE PROCEDURE   Follow your health care provider's instructions if you are taking dietary supplements or medicines. You may need to stop taking them or reduce your dosage.   Do not take any new dietary supplements or medicines without asking your health care provider first.   Follow your health care provider's instructions about eating and drinking before the procedure. You may need to stop eating and drinking several hours before the procedure.   Arrange to have an adult drive you home after the procedure. PROCEDURE  You may need to remove your clothing and dress in an open-back gown so that your health care provider can access your spine.   The procedure will be done while you are lying on an X-ray table. Most of the time you will be asked to lie on your stomach, but you may be asked to lie in a different position if an injection will be made in your neck.   Special machines will be used to monitor  your oxygen levels, heart rate, and blood pressure.   If an injection will be made in your neck, an intravenous (IV) tube will be inserted into one of your veins. Fluids and medicine will flow directly into your body through the IV tube.   The area over the facet joint where the injection will be made will be cleaned with an antiseptic soap. The surrounding skin will be covered with sterile drapes.   An anesthetic will be applied to your skin to make the injection area numb. You may feel a temporary stinging or burning sensation.   A video X-ray machine will be used to locate the joint. A contrast dye may be injected into the facet  joint area to help with locating the joint.   When the joint is located, an anesthetic medicine will be injected into the joint through the needle.   Your health care provider will ask you whether you feel pain relief. If you do feel relief, a steroid may be injected to provide pain relief for a longer period of time. If you do not feel relief or feel only partial relief, additional injections of an anesthetic may be made in other facet joints.   The needle will be removed, the skin will be cleansed, and bandages will be applied.  AFTER THE PROCEDURE   You will be observed for 15-30 minutes before being allowed to go home. Do not drive. Have an adult drive you or take a taxi or public transportation instead.   If you feel pain relief, the pain will return in several hours or days when the anesthetic wears off.   You may feel pain relief 2-14 days after the procedure. The amount of time this relief lasts varies from person to person.   It is normal to feel some tenderness over the injected area(s) for 2 days following the procedure.   If you have diabetes, you may have a temporary increase in blood sugar. This information is not intended to replace advice given to you by your health care provider. Make sure you discuss any questions you have with your health care provider. Document Released: 05/20/2006 Document Revised: 01/19/2014 Document Reviewed: 09/24/2014 Elsevier Interactive Patient Education  2017 [place] After Refer to this sheet in the next few weeks. These instructions provide you with information on caring for yourself after your procedure. Your health care provider may also give you more specific instructions. Your treatment has been planned according to current medical practices, but problems sometimes occur. Call your health care provider if you have any problems or questions after your procedure. HOME CARE INSTRUCTIONS   Keep track of the  amount of pain relief you feel and how long it lasts.  Limit pain medicine within the first 4-6 hours after the procedure as directed by your health care provider.  Resume taking dietary supplements and medicines as directed by your health care provider.  You may resume your regular diet.  Do not apply heat near or over the injection site(s) for 24 hours.   Do not take a bath or soak in water (such as a pool or lake) for 24 hours.  Do not drive for 24 hours unless approved by your health care provider.  Avoid strenuous activity for 24 hours.  Remove your bandages the morning after the procedure.   If the injection site is tender, applying an ice pack may relieve some tenderness. To do this:  Put ice in a bag.  Place a towel between your skin and the bag.  Leave the ice on for 15-20 minutes, 3-4 times a day.  Keep follow-up appointments as directed by your health care provider. SEEK MEDICAL CARE IF:   Your pain is not controlled by your medicines.   There is drainage from the injection site.   There is significant bleeding or swelling at the injection site.  You have diabetes and your blood sugar is above 180 mg/dL. SEEK IMMEDIATE MEDICAL CARE IF:   You develop a fever of 101F (38.3C) or greater.   You have worsening pain or swelling around the injection site.   You have red streaking around the injection site.   You develop severe pain that is not controlled by your medicines.   You develop a headache, stiff neck, nausea, or vomiting.   Your eyes become very sensitive to light.   You have weakness, paralysis, or tingling in your arms or legs that was not present before the procedure.   You develop difficulty urinating or breathing.  This information is not intended to replace advice given to you by your health care provider. Make sure you discuss any questions you have with your health care provider. Document Released: 12/16/2011 Document Revised:  01/19/2014 Document Reviewed: 09/24/2014 Elsevier Interactive Patient Education  2017 [employer].

## 2015-12-23 NOTE — Progress Notes (Signed)
Safety precautions to be maintained throughout the outpatient stay will include: orient to surroundings, keep bed in low position, maintain call bell within reach at all times, provide assistance with transfer out of bed and ambulation.  

## 2015-12-24 ENCOUNTER — Telehealth: Payer: Self-pay

## 2015-12-24 NOTE — Telephone Encounter (Signed)
Denies any needs yesterday but states he got sore during the night. Instructed to call if needed

## 2015-12-31 ENCOUNTER — Ambulatory Visit: Payer: Medicare Other | Admitting: Pain Medicine

## 2016-01-28 ENCOUNTER — Ambulatory Visit: Payer: Medicare Other | Attending: Pain Medicine | Admitting: Pain Medicine

## 2016-01-28 VITALS — BP 191/97 | HR 111 | Temp 97.7°F | Resp 18 | Ht 69.0 in | Wt 242.0 lb

## 2016-01-28 DIAGNOSIS — M1288 Other specific arthropathies, not elsewhere classified, other specified site: Secondary | ICD-10-CM

## 2016-01-28 DIAGNOSIS — M4726 Other spondylosis with radiculopathy, lumbar region: Secondary | ICD-10-CM | POA: Diagnosis not present

## 2016-01-28 DIAGNOSIS — Z87891 Personal history of nicotine dependence: Secondary | ICD-10-CM | POA: Insufficient documentation

## 2016-01-28 DIAGNOSIS — E1140 Type 2 diabetes mellitus with diabetic neuropathy, unspecified: Secondary | ICD-10-CM | POA: Insufficient documentation

## 2016-01-28 DIAGNOSIS — E785 Hyperlipidemia, unspecified: Secondary | ICD-10-CM | POA: Diagnosis not present

## 2016-01-28 DIAGNOSIS — R1310 Dysphagia, unspecified: Secondary | ICD-10-CM | POA: Insufficient documentation

## 2016-01-28 DIAGNOSIS — G894 Chronic pain syndrome: Secondary | ICD-10-CM

## 2016-01-28 DIAGNOSIS — M79605 Pain in left leg: Secondary | ICD-10-CM

## 2016-01-28 DIAGNOSIS — E669 Obesity, unspecified: Secondary | ICD-10-CM | POA: Diagnosis not present

## 2016-01-28 DIAGNOSIS — I252 Old myocardial infarction: Secondary | ICD-10-CM | POA: Diagnosis not present

## 2016-01-28 DIAGNOSIS — Z794 Long term (current) use of insulin: Secondary | ICD-10-CM | POA: Diagnosis not present

## 2016-01-28 DIAGNOSIS — I110 Hypertensive heart disease with heart failure: Secondary | ICD-10-CM | POA: Diagnosis not present

## 2016-01-28 DIAGNOSIS — J449 Chronic obstructive pulmonary disease, unspecified: Secondary | ICD-10-CM | POA: Insufficient documentation

## 2016-01-28 DIAGNOSIS — M545 Low back pain: Secondary | ICD-10-CM | POA: Diagnosis not present

## 2016-01-28 DIAGNOSIS — I2510 Atherosclerotic heart disease of native coronary artery without angina pectoris: Secondary | ICD-10-CM | POA: Diagnosis not present

## 2016-01-28 DIAGNOSIS — K219 Gastro-esophageal reflux disease without esophagitis: Secondary | ICD-10-CM | POA: Insufficient documentation

## 2016-01-28 DIAGNOSIS — M79604 Pain in right leg: Secondary | ICD-10-CM

## 2016-01-28 DIAGNOSIS — Z6835 Body mass index (BMI) 35.0-35.9, adult: Secondary | ICD-10-CM | POA: Diagnosis not present

## 2016-01-28 DIAGNOSIS — M47816 Spondylosis without myelopathy or radiculopathy, lumbar region: Secondary | ICD-10-CM

## 2016-01-28 DIAGNOSIS — F1190 Opioid use, unspecified, uncomplicated: Secondary | ICD-10-CM

## 2016-01-28 DIAGNOSIS — M25562 Pain in left knee: Secondary | ICD-10-CM

## 2016-01-28 DIAGNOSIS — M25561 Pain in right knee: Secondary | ICD-10-CM

## 2016-01-28 DIAGNOSIS — F419 Anxiety disorder, unspecified: Secondary | ICD-10-CM | POA: Diagnosis not present

## 2016-01-28 DIAGNOSIS — M533 Sacrococcygeal disorders, not elsewhere classified: Secondary | ICD-10-CM | POA: Diagnosis not present

## 2016-01-28 DIAGNOSIS — E1165 Type 2 diabetes mellitus with hyperglycemia: Secondary | ICD-10-CM | POA: Insufficient documentation

## 2016-01-28 DIAGNOSIS — I5022 Chronic systolic (congestive) heart failure: Secondary | ICD-10-CM | POA: Diagnosis not present

## 2016-01-28 DIAGNOSIS — Z79899 Other long term (current) drug therapy: Secondary | ICD-10-CM | POA: Diagnosis not present

## 2016-01-28 DIAGNOSIS — Z79891 Long term (current) use of opiate analgesic: Secondary | ICD-10-CM | POA: Diagnosis not present

## 2016-01-28 DIAGNOSIS — G8929 Other chronic pain: Secondary | ICD-10-CM

## 2016-01-28 MED ORDER — OXYCODONE HCL 15 MG PO TABS: 15.0000 mg | ORAL_TABLET | Freq: Four times a day (QID) | ORAL | 0 refills | Status: DC | PRN

## 2016-01-28 MED ORDER — NALOXONE HCL 2 MG/2ML IJ SOSY: PREFILLED_SYRINGE | INTRAMUSCULAR | 1 refills | Status: DC

## 2016-01-28 NOTE — Progress Notes (Signed)
Patient's Name: Manuel Knight  MRN: [id number]  Referring Provider: [facility]  DOB: 1960/06/09  PCP: [facility]  DOS: 01/28/2016  Note by: Kathlen Brunswick. Dossie Arbour, MD  Service setting: Ambulatory outpatient  Specialty: Interventional Pain Management  Location: [facility] (AMB) Pain Management Facility    Patient type: Established   Primary Reason(s) for Visit: Encounter for prescription drug management & post-procedure evaluation of chronic illness with mild to moderate exacerbation(Level of risk: moderate) CC: Back Pain (lower) and Knee Pain (left)  HPI  Manuel Knight is a 56 y.o. year old, male patient, who comes today for a post-procedure evaluation and medication management. He has Type 2 diabetes mellitus with hyperglycemia, with long-term current use of insulin ([place]); HLD (hyperlipidemia); Obesity, unspecified; Anxiety state; Essential hypertension; COPD; PULMONARY NODULE; HERNIA; GERD (gastroesophageal reflux disease); Esophageal dysphagia; NSTEMI (non-ST elevated myocardial infarction) ([place]); CAD (coronary artery disease), native coronary artery; S/P CABG x 3; Chronic systolic heart failure ([place]); Emphysema of lung ([place]); Lumbar spondylosis with lumbar radiculitis (Left); H/O coronary artery bypass surgery; Encounter for pain management planning; Encounter for therapeutic drug level monitoring; Opiate use (90 MME/Day); Chronic low back pain (Location of Secondary source of pain) (Bilateral) (L>R); Chronic knee pain (Location of Primary Source of Pain) (Bilateral) (L>R); Chronic foot pain (Bilateral) (L>R); Disturbance of skin sensation; Diabetic peripheral neuropathy (HCC) (lower extremity); Chronic lumbar radicular pain (L5 dermatome) (Location of Tertiary source of pain) (Left); 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) (L>R); Osteoarthritis of knee (Bilateral) (L>R); Lumbar facet syndrome (Location of Secondary source of pain)  (Bilateral) (L>R); Lumbar facet arthropathy; Chronic lower extremity pain (Location of Tertiary source of pain) (Bilateral) (L>R); History of marijuana use; Hypomagnesemia; Vitamin D insufficiency; Long term current use of opiate analgesic; Long term prescription opiate use; Chronic pain syndrome; and SIRS (systemic inflammatory response syndrome) ([place]) on his problem list. His primarily concern today is the Back Pain (lower) and Knee Pain (left)  Pain Assessment: Self-Reported Pain Score: 4 /10 Clinically the patient looks like a 2/10 Reported level is inconsistent with clinical observations. Information on the proper use of the pain score provided to the patient today. Pain Type: Chronic pain Pain Location: Back Pain Orientation: Lower Pain Descriptors / Indicators:  (steady) Pain Frequency: Constant  Manuel Knight was last seen on 12/23/2015 for a procedure. During today's appointment we reviewed Manuel Knight's post-procedure results, as well as his outpatient medication regimen.  Further details on both, my assessment(s), as well as the proposed treatment plan, please see below.  Controlled Substance Pharmacotherapy Assessment REMS (Risk Evaluation and Mitigation Strategy)  Analgesic:Oxycodone IR 15 mg every 6 hours (60 mg/day) MME/day:90 mg/day. Manuel Martins, RN  01/28/2016 11:10 AM  Sign at close encounter Nursing Pain Medication Assessment:  Safety precautions to be maintained throughout the outpatient stay will include: orient to surroundings, keep bed in low position, maintain call bell within reach at all times, provide assistance with transfer out of bed and ambulation.  Medication Inspection Compliance: Pill count conducted under aseptic conditions, in front of the patient. Neither the pills nor the bottle was removed from the patient's sight at any time. Once count was completed pills were immediately returned to the patient in their original bottle.  Medication: See above Pill  Count: 41 of 120 pills remain Bottle Appearance: Standard pharmacy container. Clearly labeled. Filled Date: 38 / 29 / 2017 Medication last intake: 01-28-16 at 0600   Pharmacokinetics: Liberation and absorption (  onset of action): WNL Distribution (time to peak effect): WNL Metabolism and excretion (duration of action): WNL         Pharmacodynamics: Desired effects: Analgesia: Manuel Knight reports >50% benefit. Functional ability: Patient reports that medication allows him to accomplish basic ADLs Clinically meaningful improvement in function (CMIF): Sustained CMIF goals met Perceived effectiveness: Described as relatively effective, allowing for increase in activities of daily living (ADL) Undesirable effects: Side-effects or Adverse reactions: None reported Monitoring:  PMP: Online review of the past 73-monthperiod conducted. Compliant with practice rules and regulations List of all UDS test(s) done:  Lab Results  Component Value Date   TOXASSSELUR FINAL 08/27/2015   SUMMARY FINAL 06/13/2015   Last UDS on record: ToxAssure Select 13  Date Value Ref Range Status  08/27/2015 FINAL  Final    Comment:    ==================================================================== TOXASSURE SELECT 13 (MW) ==================================================================== Test                             Result       Flag       Units Drug Present and Declared for Prescription Verification   Alprazolam                     128          EXPECTED   ng/mg creat   Alpha-hydroxyalprazolam        63           EXPECTED   ng/mg creat    Source of alprazolam is a scheduled prescription medication.    Alpha-hydroxyalprazolam is an expected metabolite of alprazolam.   Oxycodone                      1064         EXPECTED   ng/mg creat   Oxymorphone                    107          EXPECTED   ng/mg creat   Noroxycodone                   576          EXPECTED   ng/mg creat    Sources of oxycodone  include scheduled prescription medications.    Oxymorphone and noroxycodone are expected metabolites of    oxycodone. Oxymorphone is also available as a scheduled    prescription medication. Drug Present not Declared for Prescription Verification   Alcohol, Ethyl                 >0.400       UNEXPECTED g/dL    Sources of ethyl alcohol include alcoholic beverages or as a    fermentation product of glucose; glucose is present in this    specimen.  The high concentration of ethyl alcohol and the    presence of glucose supports fermentation as the source of ethyl    alcohol in this specimen. Drug Absent but Declared for Prescription Verification   Amphetamine                    Not Detected UNEXPECTED ng/mg creat ==================================================================== Test                      Result    Flag   Units  Ref Range   Creatinine              102              mg/dL      >=20 ==================================================================== Declared Medications:  The flagging and interpretation on this report are based on the  following declared medications.  Unexpected results may arise from  inaccuracies in the declared medications.  **Note: The testing scope of this panel includes these medications:  Alprazolam (Xanax)  Amphetamine (Adderall)  Oxycodone (Roxicodone)  **Note: The testing scope of this panel does not include following  reported medications:  Albuterol (ProAir)  Albuterol (Proventil)  Amlodipine (Norvasc)  Aspirin  Atorvastatin (Lipitor)  Benazepril (Lotensin)  Canagliflozin (Invokana)  Carvedilol (Coreg)  Citalopram (Celexa)  Clonidine (Catapres)  Duloxetine (Cymbalta)  Fluticasone (Advair)  Gabapentin  Insulin (Levemir)  Insulin (NovoLog)  Magnesium  Meloxicam (Mobic)  Metformin (Glucophage)  Mupirocin (Bactroban)  Naloxone  Nitroglycerin (Nitrostat)  Pantoprazole (Protonix)  Sacubitril (Entresto)  Salmeterol (Advair)   Tiotropium (Spiriva)  Valsartan (Entresto)  Vitamin D2 (Drisdol) ==================================================================== For clinical consultation, please call [phone]. ====================================================================    UDS interpretation: Compliant          Medication Assessment Form: Reviewed. Patient indicates being compliant with therapy Treatment compliance: Compliant Risk Assessment Profile: Aberrant behavior: See prior evaluations. None observed or detected today Comorbid factors increasing risk of overdose: See prior notes. No additional risks detected today Risk of substance use disorder (SUD): Low Opioid Risk Tool (ORT) Total Score:    Interpretation Table:  Score <3 = Low Risk for SUD  Score between 4-7 = Moderate Risk for SUD  Score >8 = High Risk for Opioid Abuse   Risk Mitigation Strategies:  Patient Counseling: Covered Patient-Prescriber Agreement (PPA): Present and active  Notification to other healthcare providers: Done  Pharmacologic Plan: No change in therapy, at this time  Post-Procedure Assessment  12/23/2015 Procedure: Diagnostic bilateral lumbar facet block + diagnostic bilateral sacroiliac joint block under fluoroscopic guidance and IV sedation Post-procedure pain score: 1/10 The patient described his pain score has going down from 5/10 to 1/10, which would represent an 80% decrease. Influential Factors: BMI: 35.74 kg/m Intra-procedural challenges: None observed Assessment challenges: Results reported today are inconsistent with those reported on procedure day, immediately before discharge.         Post-procedural side-effects, adverse reactions, or complications: None reported Reported issues: None  Sedation: Please see nurses note. When no sedatives are used, the analgesic levels obtained are directly associated to the effectiveness of the local anesthetics. However, when sedation is provided, the level of  analgesia obtained during the initial 1 hour following the intervention, is believed to be the result of a combination of factors. These factors may include, but are not limited to: 1. The effectiveness of the local anesthetics used. 2. The effects of the analgesic(s) and/or anxiolytic(s) used. 3. The degree of discomfort experienced by the patient at the time of the procedure. 4. The patients ability and reliability in recalling and recording the events. 5. The presence and influence of possible secondary gains and/or psychosocial factors. Reported result: Relief experienced during the 1st hour after the procedure: 100 % (Ultra-Short Term Relief) Interpretative annotation: Analgesia during this period is likely to be Local Anesthetic and/or IV Sedative (Analgesic/Anxiolitic) related.          Effects of local anesthetic: The analgesic effects attained during this period are directly associated to the localized infiltration of local anesthetics and therefore cary  significant diagnostic value as to the etiological location, or anatomical origin, of the pain. Expected duration of relief is directly dependent on the pharmacodynamics of the local anesthetic used. Long-acting (4-6 hours) anesthetics used.  Reported result: Relief during the next 4 to 6 hour after the procedure: 100 % (Short-Term Relief) Interpretative annotation: Complete relief would suggest area to be the source of the pain.          Long-term benefit: Defined as the period of time past the expected duration of local anesthetics. With the possible exception of prolonged sympathetic blockade from the local anesthetics, benefits during this period are typically attributed to, or associated with, other factors such as analgesic sensory neuropraxia, antiinflammatory effects, or beneficial biochemical changes provided by agents other than the local anesthetics Reported result: Extended relief following procedure: 0 % (Long-Term  Relief) Interpretative annotation: Good relief. This could suggest inflammation to be a significant component in the etiology to the pain.          Current benefits: Defined as persistent relief that continues at this point in time.   Reported results: Treated area: 0 %       Interpretative annotation: Recurrance of symptoms. This would suggest persistent aggravating factors  Interpretation: Results would suggest a successful diagnostic intervention. The patient has failed to respond to conservative therapies including over-the-counter medications, anti-inflammatories, muscle relaxants, membrane stabilizers, opioids, physical therapy, modalities such as heat and ice, as well as more invasive techniques such as nerve blocks. Because Mr. Marquina did attain more than 50% relief of the pain during a series of diagnostic blocks conducted in separate occasions, I believe it is medically necessary to proceed with Radiofrequency Ablation, in order to attempt gaining longer relief.  Laboratory Chemistry  Inflammation Markers Lab Results  Component Value Date   ESRSEDRATE 2 06/13/2015   CRP 0.8 06/13/2015   Renal Function Lab Results  Component Value Date   BUN 15 06/13/2015   CREATININE 0.98 06/13/2015   GFRAA >60 06/13/2015   GFRNONAA >60 06/13/2015   Hepatic Function Lab Results  Component Value Date   AST 17 06/13/2015   ALT 19 06/13/2015   ALBUMIN 4.0 06/13/2015   Electrolytes Lab Results  Component Value Date   NA 138 06/13/2015   K 4.6 06/13/2015   CL 107 06/13/2015   CALCIUM 9.4 06/13/2015   MG 1.4 (L) 06/13/2015   Pain Modulating Vitamins Lab Results  Component Value Date   VD25OH 31 02/03/2010   25OHVITD1 25 (L) 06/13/2015   25OHVITD2 <1.0 06/13/2015   25OHVITD3 25 06/13/2015   VITAMINB12 1,002 (H) 06/13/2015   Coagulation Parameters Lab Results  Component Value Date   INR 1.59 (H) 02/06/2013   LABPROT 18.5 (H) 02/06/2013   APTT 40 (H) 02/06/2013   PLT 307  03/15/2015   Cardiovascular Lab Results  Component Value Date   HGB 14.5 03/15/2015   HCT 45.5 03/15/2015   Note: Lab results reviewed.  Recent Diagnostic Imaging Review  Dg C-arm 1-60 Min-no Report  Result Date: 12/23/2015 There is no Radiologist interpretation  for this exam.  Note: Imaging results reviewed.          Meds  The patient has a current medication list which includes the following prescription(s): albuterol, albuterol, alprazolam, amlodipine, amphetamine-dextroamphetamine, aspirin ec, atorvastatin, benazepril, canagliflozin, carvedilol, citalopram, clonidine, d3 super strength, duloxetine, fluticasone, fluticasone-salmeterol, gabapentin, insulin aspart, insulin pen needle, insulin syringe-needle u-100, insulin syringe-needle u-100, levemir flextouch, magnesium oxide, meloxicam, metformin, naloxone, naloxone, nitroglycerin, oxycodone, oxycodone, pantoprazole,  sacubitril-valsartan, sure comfort lancets 30g, tiotropium, and truetrack test.  Current Outpatient Prescriptions on File Prior to Visit  Medication Sig  . albuterol (PROAIR HFA) 108 (90 Base) MCG/ACT inhaler Inhale 1 puff into the lungs daily.   Marland Kitchen albuterol (PROVENTIL) (2.5 MG/3ML) 0.083% nebulizer solution Inhale 2.5 mg into the lungs 2 (two) times daily as needed.   Marland Kitchen alprazolam (XANAX) 2 MG tablet Take 1 tablet by mouth 4 (four) times daily as needed.   Marland Kitchen amLODipine (NORVASC) 10 MG tablet Take 10 mg by mouth daily.  Marland Kitchen amphetamine-dextroamphetamine (ADDERALL) 20 MG tablet Take 20 mg by mouth 3 (three) times daily.   Marland Kitchen aspirin EC 81 MG tablet Take 1 tablet (81 mg total) by mouth daily.  Marland Kitchen atorvastatin (LIPITOR) 80 MG tablet Take 1 tablet by mouth daily.  . benazepril (LOTENSIN) 20 MG tablet Take 20 mg by mouth daily.   . Canagliflozin (INVOKANA) 100 MG TABS Take 100 mg by mouth daily.  . carvedilol (COREG) 25 MG tablet Take 25 mg by mouth 2 (two) times daily with a meal.   . citalopram (CELEXA) 20 MG tablet Take 1  tablet by mouth daily.  . cloNIDine (CATAPRES) 0.3 MG tablet Take 0.3 mg by mouth 2 (two) times daily.  . D3 SUPER STRENGTH 2000 units CAPS Take 1 capsule by mouth daily.  . DULoxetine (CYMBALTA) 30 MG capsule 1 tab daily for one day, then 2 tabs daily  . fluticasone (FLONASE) 50 MCG/ACT nasal spray USE TWO SPRAYS IN EACH NOSTRIL TWICE DAILY FOR 7 DAYS, THEN USE TWO SPRAYS ONCE DAILY AS DIRECTED.  Marland Kitchen Fluticasone-Salmeterol (ADVAIR DISKUS) 500-50 MCG/DOSE AEPB Inhale 1 puff into the lungs every 12 (twelve) hours.    . gabapentin (NEURONTIN) 600 MG tablet Take 600 mg by mouth 3 (three) times daily.  . insulin aspart (NOVOLOG FLEXPEN) 100 UNIT/ML FlexPen Inject 0-26 Units into the skin 3 (three) times daily with meals. 20 units three times a day with sliding scale max of 26 units  . Insulin Pen Needle (LITETOUCH PEN NEEDLES) 29G X 12.7MM MISC Use as directed.  . Insulin Syringe-Needle U-100 (B-D INS SYR ULTRAFINE 1CC/31G) 31G X 5/16" 1 ML MISC   . Insulin Syringe-Needle U-100 (B-D INS SYRINGE 0.5CC/31GX5/16) 31G X 5/16" 0.5 ML MISC   . LEVEMIR FLEXTOUCH 100 UNIT/ML Pen INJECT 20 UNITS EVERY MORNING.  . Magnesium Oxide 500 MG CAPS Take 1 capsule (500 mg total) by mouth 2 (two) times daily at 8 am and 10 pm.  . meloxicam (MOBIC) 15 MG tablet Take 15 mg by mouth 2 (two) times daily.  . metFORMIN (GLUCOPHAGE) 1000 MG tablet Take 1,000 mg by mouth 2 (two) times daily.   . naloxone HCl 4 MG/0.1ML LIQD One spray in either nostril once for known/suspected opioid overdose. May repeat every 2-3 minutes in alternating nostril til EMS arrives  . nitroGLYCERIN (NITROSTAT) 0.4 MG SL tablet Place 1 tablet (0.4 mg total) under the tongue every 5 (five) minutes as needed for chest pain.  . pantoprazole (PROTONIX) 40 MG tablet 1 tablet Two (2) times a day.  . sacubitril-valsartan (ENTRESTO) 97-103 MG Take 1 tablet by mouth 2 (two) times daily.  . SURE COMFORT LANCETS 30G MISC USE TO CHECK SUGAR THREE TIMES DAILY {AS  DIRECTED]  . tiotropium (SPIRIVA) 18 MCG inhalation capsule Place 18 mcg into inhaler and inhale daily.    Angelia Mould TEST test strip USE TO CHECK BLOOD SUGAR THREE TIMES DAILY   No current facility-administered medications  on file prior to visit.    ROS  Constitutional: Denies any fever or chills Gastrointestinal: No reported hemesis, hematochezia, vomiting, or acute GI distress Musculoskeletal: Denies any acute onset joint swelling, redness, loss of ROM, or weakness Neurological: No reported episodes of acute onset apraxia, aphasia, dysarthria, agnosia, amnesia, paralysis, loss of coordination, or loss of consciousness  Allergies  Mr. Terrio is allergic to lodine [etodolac]; tylenol [acetaminophen]; iodine; and propoxyphene n-acetaminophen.  [place]  Drug: Mr. Lafosse  reports that he does not use drugs. Alcohol:  reports that he does not drink alcohol. Tobacco:  reports that he quit smoking about 27 years ago. His smoking use included Cigarettes. He started smoking about 48 years ago. He has a 28.50 pack-year smoking history. He has never used smokeless tobacco. Medical:  has a past medical history of Anxiety; Cellulitis (2011); Chronic back pain; Claustrophobia; COPD (chronic obstructive pulmonary disease) ([place]); Diabetes mellitus, type 2 ([place]); Diverticulitis of colon (2008); GERD (gastroesophageal reflux disease); Heart attack; Hyperlipidemia; Hypertension; Obesity; and Pulmonary nodule. Family: family history includes Cancer in his mother; Coronary artery disease in his mother and sister; Diabetes in his sister; Heart attack in his brother and father.  Past Surgical History:  Procedure Laterality Date  . APPENDECTOMY  2007   gangrenous appendicitis   . CORONARY ARTERY BYPASS GRAFT N/A 02/06/2013   Procedure: CORONARY ARTERY BYPASS GRAFTING (CABG) with TEE;  Surgeon: [name], MD;  Location: [facility];  Service: Open Heart Surgery;  Laterality: N/A;  CABG x three,  using left  internal mammary artery and right leg greater saphenous vein harvested endoscopically  . LEFT HEART CATHETERIZATION WITH CORONARY ANGIOGRAM N/A 02/03/2013   Procedure: LEFT HEART CATHETERIZATION WITH CORONARY ANGIOGRAM;  Surgeon: Jettie Booze, MD;  Location: [facility] CATH LAB;  Service: Cardiovascular;  Laterality: N/A;  . TONSILLECTOMY     Constitutional Exam  General appearance: Well nourished, well developed, and well hydrated. In no apparent acute distress Vitals:   01/28/16 1102  BP: (!) 191/97  Pulse: (!) 111  Resp: 18  Temp: 97.7 F (36.5 C)  TempSrc: Oral  SpO2: 98%  Weight: 242 lb (109.8 kg)  Height: '5\' 9"'  (1.753 m)   BMI Assessment: Estimated body mass index is 35.74 kg/m as calculated from the following:   Height as of this encounter: '5\' 9"'  (1.753 m).   Weight as of this encounter: 242 lb (109.8 kg).  BMI interpretation table: BMI level Category Range association with higher incidence of chronic pain  <18 kg/m2 Underweight   18.5-24.9 kg/m2 Ideal body weight   25-29.9 kg/m2 Overweight Increased incidence by 20%  30-34.9 kg/m2 Obese (Class I) Increased incidence by 68%  35-39.9 kg/m2 Severe obesity (Class II) Increased incidence by 136%  >40 kg/m2 Extreme obesity (Class III) Increased incidence by 254%   BMI Readings from Last 4 Encounters:  01/28/16 35.74 kg/m  12/23/15 35.74 kg/m  11/28/15 36.33 kg/m  10/31/15 35.74 kg/m   Wt Readings from Last 4 Encounters:  01/28/16 242 lb (109.8 kg)  12/23/15 242 lb (109.8 kg)  11/28/15 246 lb (111.6 kg)  10/31/15 242 lb (109.8 kg)  Psych/Mental status: Alert, oriented x 3 (person, place, & time) Eyes: PERLA Respiratory: No evidence of acute respiratory distress  Cervical Spine Exam  Inspection: No masses, redness, or swelling Alignment: Symmetrical Functional ROM: Unrestricted ROM Stability: No instability detected Muscle strength & Tone: Functionally intact Sensory: Unimpaired Palpation:  Non-contributory  Upper Extremity (UE) Exam    Side: Right upper  extremity  Side: Left upper extremity  Inspection: No masses, redness, swelling, or asymmetry  Inspection: No masses, redness, swelling, or asymmetry  Functional ROM: Unrestricted ROM          Functional ROM: Unrestricted ROM          Muscle strength & Tone: Functionally intact  Muscle strength & Tone: Functionally intact  Sensory: Unimpaired  Sensory: Unimpaired  Palpation: Non-contributory  Palpation: Non-contributory   Thoracic Spine Exam  Inspection: No masses, redness, or swelling Alignment: Symmetrical Functional ROM: Unrestricted ROM Stability: No instability detected Sensory: Unimpaired Muscle strength & Tone: Functionally intact Palpation: Non-contributory  Lumbar Spine Exam  Inspection: No masses, redness, or swelling Alignment: Symmetrical Functional ROM: Decreased ROM Stability: No instability detected Muscle strength & Tone: Increased muscle tone over affected area Sensory: Movement-associated pain Palpation: Complains of area being tender to palpation Provocative Tests: Lumbar Hyperextension and rotation test: Positive bilaterally for facet joint pain. Patrick's Maneuver: Positive for bilateral S-I joint pain           Gait & Posture Assessment  Ambulation: Unassisted Gait: Relatively normal for age and body habitus Posture: WNL   Lower Extremity Exam    Side: Right lower extremity  Side: Left lower extremity  Inspection: No masses, redness, swelling, or asymmetry  Inspection: No masses, redness, swelling, or asymmetry  Functional ROM: Unrestricted ROM          Functional ROM: Unrestricted ROM          Muscle strength & Tone: Functionally intact  Muscle strength & Tone: Functionally intact  Sensory: Unimpaired  Sensory: Unimpaired  Palpation: Non-contributory  Palpation: Non-contributory   Assessment  Primary Diagnosis & Pertinent Problem List: The primary encounter diagnosis was Chronic pain  syndrome. Diagnoses of Chronic knee pain (Location of Primary Source of Pain) (Bilateral) (L>R), Chronic low back pain (Location of Secondary source of pain) (Bilateral) (L>R), Chronic lower extremity pain (Location of Tertiary source of pain) (Bilateral) (L>R), Lumbar spondylosis with lumbar radiculitis (Left), Long term current use of opiate analgesic, Opiate use (90 MME/Day), Lumbar facet syndrome (Location of Secondary source of pain) (Bilateral) (L>R), and 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) (L>R) were also pertinent to this visit.  Status Diagnosis  Controlled Controlled Not improving 1. Chronic pain syndrome   2. Chronic knee pain (Location of Primary Source of Pain) (Bilateral) (L>R)   3. Chronic low back pain (Location of Secondary source of pain) (Bilateral) (L>R)   4. Chronic lower extremity pain (Location of Tertiary source of pain) (Bilateral) (L>R)   5. Lumbar spondylosis with lumbar radiculitis (Left)   6. Long term current use of opiate analgesic   7. Opiate use (90 MME/Day)   8. Lumbar facet syndrome (Location of Secondary source of pain) (Bilateral) (L>R)   9. 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 oxyCODONE (ROXICODONE) 15 MG immediate release tablet    Sig: Take 1 tablet (15 mg total) by mouth every 6 (six) hours as needed for pain.    Dispense:  120 tablet    Refill:  0    Do not add this medication to the electronic "Automatic Refill" notification system. Patient may have prescription filled one day early if pharmacy is closed on scheduled refill date. Do not fill until: 02/09/16 To last until: 03/10/16  . oxyCODONE (ROXICODONE) 15  MG immediate release tablet    Sig: Take 1 tablet (15 mg total) by mouth every 6 (six) hours as needed.    Dispense:  120 tablet    Refill:  0     Do not add this medication to the electronic "Automatic Refill" notification system. Patient may have prescription filled one day early if pharmacy is closed on scheduled refill date. Do not fill until: 03/10/16 To last until: 04/09/16  . naloxone [facility]) 2 MG/2ML injection    Sig: Inject content of syringe into thigh muscle. Call 911.    Dispense:  2 Syringe    Refill:  1    NDC # [id number]. Please teach proper use of device.   New Prescriptions   NALOXONE (NARCAN) 2 MG/2ML INJECTION    Inject content of syringe into thigh muscle. Call 911.   Medications administered today: Mr. Rasnic had no medications administered during this visit. Lab-work, procedure(s), and/or referral(s): Orders Placed This Encounter  Procedures  . Radiofrequency,Lumbar  . Radiofrequency Sacroiliac Joint   Imaging and/or referral(s): None  Interventional therapies: Planned, scheduled, and/or pending:   Diagnostic Bilateral Lumbar Facet Block + Bilateral SI Radiofrequency ablation, starting with the left side.    Considering:   Diagnostic bilateral intra-articular knee injection Diagnostic bilateral genicular nerve block Possible bilateral genicular nerve radiofrequency ablation  Diagnostic bilateral lumbar facet block Possible bilateral lumbar facet radiofrequency ablation Diagnostic left-sided L4-5 lumbar epidural steroid injection Diagnostic bilateral sacroiliac joint block Possible bilateral sacroiliac joint radiofrequency ablation   Palliative PRN treatment(s):   Diagnostic bilateral intra-articular knee injection Diagnostic bilateral genicular nerve block Diagnostic bilateral lumbar facet block Diagnostic left-sided L4-5 lumbar epidural steroid injection Diagnostic bilateral sacroiliac joint block   Provider-requested follow-up: Return in about 2 months (around 03/27/2016) for (MD) Med-Mgmt, in addition, Procedure: Fct + SI RFA.  Future Appointments Date Time Provider [place]  03/26/2016 9:45 AM Milinda Pointer, MD [facility] None  03/30/2016 10:45 AM Milinda Pointer, MD [facility] None   Primary Care Physician: [facility] Location: [facility] Facility Note by: Kathlen Brunswick. Dossie Arbour, M.D, DABA, DABAPM, DABPM, DABIPP, FIPP Date: 01/28/2016; Time: 11:52 AM  Pain Score Disclaimer: We use the NRS-11 scale. This is a self-reported, subjective measurement of pain severity with only modest accuracy. It is used primarily to identify changes within a particular patient. It must be understood that outpatient pain scales are significantly less accurate that those used for research, where they can be applied under ideal controlled circumstances with minimal exposure to variables. In reality, the score is likely to be a combination of pain intensity and pain affect, where pain affect describes the degree of emotional arousal or changes in action readiness caused by the sensory experience of pain. Factors such as social and work situation, setting, emotional state, anxiety levels, expectation, and prior pain experience may influence pain perception and show large inter-individual differences that may also be affected by time variables.  Patient instructions provided during this appointment: Patient Instructions  You were given 2 prescriptions for Oxycodone today. Radiofrequency Lesioning Introduction Radiofrequency lesioning is a procedure that is performed to relieve pain. The procedure is often used for back, neck, or arm pain. Radiofrequency lesioning involves the use of a machine that creates radio waves to make heat. During the procedure, the heat is applied to the nerve that carries the pain signal. The heat damages the nerve and interferes with the pain signal. Pain relief usually starts about 2 weeks after  the procedure and lasts for 6 months to 1 year. Tell a health care provider about:  Any allergies you have.  All  medicines you are taking, including vitamins, herbs, eye drops, creams, and over-the-counter medicines.  Any problems you or family members have had with anesthetic medicines.  Any blood disorders you have.  Any surgeries you have had.  Any medical conditions you have.  Whether you are pregnant or may be pregnant. What are the risks? Generally, this is a safe procedure. However, problems may occur, including:  Pain or soreness at the injection site.  Infection at the injection site.  Damage to nerves or blood vessels. What happens before the procedure?  Ask your health care provider about:  Changing or stopping your regular medicines. This is especially important if you are taking diabetes medicines or blood thinners.  Taking medicines such as aspirin and ibuprofen. These medicines can thin your blood. Do not take these medicines before your procedure if your health care provider instructs you not to.  Follow instructions from your health care provider about eating or drinking restrictions.  Plan to have someone take you home after the procedure.  If you go home right after the procedure, plan to have someone with you for 24 hours. What happens during the procedure?  You will be given one or more of the following:  A medicine to help you relax (sedative).  A medicine to numb the area (local anesthetic).  You will be awake during the procedure. You will need to be able to talk with the health care provider during the procedure.  With the help of a type of X-ray (fluoroscopy), the health care provider will insert a radiofrequency needle into the area to be treated.  Next, a wire that carries the radio waves (electrode) will be put through the radiofrequency needle. An electrical pulse will be sent through the electrode to verify the correct nerve. You will feel a tingling sensation, and you may have muscle twitching.  Then, the tissue that is around the needle tip will be  heated by an electric current that is passed using the radiofrequency machine. This will numb the nerves.  A bandage (dressing) will be put on the insertion area after the procedure is done. The procedure may vary among health care providers and hospitals. What happens after the procedure?  Your blood pressure, heart rate, breathing rate, and blood oxygen level will be monitored often until the medicines you were given have worn off.  Return to your normal activities as directed by your health care provider. This information is not intended to replace advice given to you by your health care provider. Make sure you discuss any questions you have with your health care provider. Document Released: 08/27/2010 Document Revised: 06/06/2015 Document Reviewed: 02/05/2014  2017 Elsevier

## 2016-01-28 NOTE — Progress Notes (Signed)
Nursing Pain Medication Assessment:  Safety precautions to be maintained throughout the outpatient stay will include: orient to surroundings, keep bed in low position, maintain call bell within reach at all times, provide assistance with transfer out of bed and ambulation.  Medication Inspection Compliance: Pill count conducted under aseptic conditions, in front of the patient. Neither the pills nor the bottle was removed from the patient's sight at any time. Once count was completed pills were immediately returned to the patient in their original bottle.  Medication: See above Pill Count: 41 of 120 pills remain Bottle Appearance: Standard pharmacy container. Clearly labeled. Filled Date: 21 / 29 / 2017 Medication last intake: 01-28-16 at 0600

## 2016-01-28 NOTE — Patient Instructions (Signed)
You were given 2 prescriptions for Oxycodone today. Radiofrequency Lesioning Introduction Radiofrequency lesioning is a procedure that is performed to relieve pain. The procedure is often used for back, neck, or arm pain. Radiofrequency lesioning involves the use of a machine that creates radio waves to make heat. During the procedure, the heat is applied to the nerve that carries the pain signal. The heat damages the nerve and interferes with the pain signal. Pain relief usually starts about 2 weeks after the procedure and lasts for 6 months to 1 year. Tell a health care provider about:  Any allergies you have.  All medicines you are taking, including vitamins, herbs, eye drops, creams, and over-the-counter medicines.  Any problems you or family members have had with anesthetic medicines.  Any blood disorders you have.  Any surgeries you have had.  Any medical conditions you have.  Whether you are pregnant or may be pregnant. What are the risks? Generally, this is a safe procedure. However, problems may occur, including:  Pain or soreness at the injection site.  Infection at the injection site.  Damage to nerves or blood vessels. What happens before the procedure?  Ask your health care provider about:  Changing or stopping your regular medicines. This is especially important if you are taking diabetes medicines or blood thinners.  Taking medicines such as aspirin and ibuprofen. These medicines can thin your blood. Do not take these medicines before your procedure if your health care provider instructs you not to.  Follow instructions from your health care provider about eating or drinking restrictions.  Plan to have someone take you home after the procedure.  If you go home right after the procedure, plan to have someone with you for 24 hours. What happens during the procedure?  You will be given one or more of the following:  A medicine to help you relax (sedative).  A  medicine to numb the area (local anesthetic).  You will be awake during the procedure. You will need to be able to talk with the health care provider during the procedure.  With the help of a type of X-ray (fluoroscopy), the health care provider will insert a radiofrequency needle into the area to be treated.  Next, a wire that carries the radio waves (electrode) will be put through the radiofrequency needle. An electrical pulse will be sent through the electrode to verify the correct nerve. You will feel a tingling sensation, and you may have muscle twitching.  Then, the tissue that is around the needle tip will be heated by an electric current that is passed using the radiofrequency machine. This will numb the nerves.  A bandage (dressing) will be put on the insertion area after the procedure is done. The procedure may vary among health care providers and hospitals. What happens after the procedure?  Your blood pressure, heart rate, breathing rate, and blood oxygen level will be monitored often until the medicines you were given have worn off.  Return to your normal activities as directed by your health care provider. This information is not intended to replace advice given to you by your health care provider. Make sure you discuss any questions you have with your health care provider. Document Released: 08/27/2010 Document Revised: 06/06/2015 Document Reviewed: 02/05/2014  2017 Elsevier

## 2016-03-26 ENCOUNTER — Encounter: Payer: Self-pay | Admitting: Pain Medicine

## 2016-03-26 ENCOUNTER — Ambulatory Visit: Payer: Medicare Other | Attending: Pain Medicine | Admitting: Pain Medicine

## 2016-03-26 VITALS — BP 149/79 | HR 91 | Temp 97.8°F | Resp 16 | Ht 69.0 in | Wt 242.0 lb

## 2016-03-26 DIAGNOSIS — G8929 Other chronic pain: Secondary | ICD-10-CM

## 2016-03-26 DIAGNOSIS — K219 Gastro-esophageal reflux disease without esophagitis: Secondary | ICD-10-CM | POA: Insufficient documentation

## 2016-03-26 DIAGNOSIS — Z8249 Family history of ischemic heart disease and other diseases of the circulatory system: Secondary | ICD-10-CM | POA: Insufficient documentation

## 2016-03-26 DIAGNOSIS — I5022 Chronic systolic (congestive) heart failure: Secondary | ICD-10-CM | POA: Insufficient documentation

## 2016-03-26 DIAGNOSIS — Z794 Long term (current) use of insulin: Secondary | ICD-10-CM | POA: Diagnosis not present

## 2016-03-26 DIAGNOSIS — E669 Obesity, unspecified: Secondary | ICD-10-CM | POA: Insufficient documentation

## 2016-03-26 DIAGNOSIS — F419 Anxiety disorder, unspecified: Secondary | ICD-10-CM | POA: Diagnosis not present

## 2016-03-26 DIAGNOSIS — E1165 Type 2 diabetes mellitus with hyperglycemia: Secondary | ICD-10-CM | POA: Insufficient documentation

## 2016-03-26 DIAGNOSIS — Z6835 Body mass index (BMI) 35.0-35.9, adult: Secondary | ICD-10-CM | POA: Insufficient documentation

## 2016-03-26 DIAGNOSIS — I110 Hypertensive heart disease with heart failure: Secondary | ICD-10-CM | POA: Diagnosis not present

## 2016-03-26 DIAGNOSIS — Z951 Presence of aortocoronary bypass graft: Secondary | ICD-10-CM | POA: Diagnosis not present

## 2016-03-26 DIAGNOSIS — F1190 Opioid use, unspecified, uncomplicated: Secondary | ICD-10-CM

## 2016-03-26 DIAGNOSIS — Z833 Family history of diabetes mellitus: Secondary | ICD-10-CM | POA: Insufficient documentation

## 2016-03-26 DIAGNOSIS — M170 Bilateral primary osteoarthritis of knee: Secondary | ICD-10-CM | POA: Diagnosis not present

## 2016-03-26 DIAGNOSIS — Z79899 Other long term (current) drug therapy: Secondary | ICD-10-CM | POA: Insufficient documentation

## 2016-03-26 DIAGNOSIS — M25562 Pain in left knee: Secondary | ICD-10-CM

## 2016-03-26 DIAGNOSIS — Z5181 Encounter for therapeutic drug level monitoring: Secondary | ICD-10-CM | POA: Diagnosis not present

## 2016-03-26 DIAGNOSIS — R1310 Dysphagia, unspecified: Secondary | ICD-10-CM | POA: Insufficient documentation

## 2016-03-26 DIAGNOSIS — E785 Hyperlipidemia, unspecified: Secondary | ICD-10-CM | POA: Insufficient documentation

## 2016-03-26 DIAGNOSIS — E1140 Type 2 diabetes mellitus with diabetic neuropathy, unspecified: Secondary | ICD-10-CM | POA: Diagnosis not present

## 2016-03-26 DIAGNOSIS — M79605 Pain in left leg: Secondary | ICD-10-CM

## 2016-03-26 DIAGNOSIS — Z79891 Long term (current) use of opiate analgesic: Secondary | ICD-10-CM

## 2016-03-26 DIAGNOSIS — Z809 Family history of malignant neoplasm, unspecified: Secondary | ICD-10-CM | POA: Insufficient documentation

## 2016-03-26 DIAGNOSIS — Z886 Allergy status to analgesic agent status: Secondary | ICD-10-CM | POA: Insufficient documentation

## 2016-03-26 DIAGNOSIS — M5416 Radiculopathy, lumbar region: Secondary | ICD-10-CM | POA: Diagnosis not present

## 2016-03-26 DIAGNOSIS — J449 Chronic obstructive pulmonary disease, unspecified: Secondary | ICD-10-CM | POA: Diagnosis not present

## 2016-03-26 DIAGNOSIS — M79604 Pain in right leg: Secondary | ICD-10-CM

## 2016-03-26 DIAGNOSIS — M545 Low back pain: Secondary | ICD-10-CM | POA: Diagnosis not present

## 2016-03-26 DIAGNOSIS — I252 Old myocardial infarction: Secondary | ICD-10-CM | POA: Insufficient documentation

## 2016-03-26 DIAGNOSIS — I2510 Atherosclerotic heart disease of native coronary artery without angina pectoris: Secondary | ICD-10-CM | POA: Insufficient documentation

## 2016-03-26 DIAGNOSIS — M25561 Pain in right knee: Secondary | ICD-10-CM | POA: Diagnosis not present

## 2016-03-26 DIAGNOSIS — Z87891 Personal history of nicotine dependence: Secondary | ICD-10-CM | POA: Insufficient documentation

## 2016-03-26 DIAGNOSIS — G894 Chronic pain syndrome: Secondary | ICD-10-CM | POA: Diagnosis present

## 2016-03-26 DIAGNOSIS — Z888 Allergy status to other drugs, medicaments and biological substances status: Secondary | ICD-10-CM | POA: Insufficient documentation

## 2016-03-26 MED ORDER — OXYCODONE HCL 15 MG PO TABS: 15.0000 mg | ORAL_TABLET | Freq: Four times a day (QID) | ORAL | 0 refills | Status: DC | PRN

## 2016-03-26 MED ORDER — MAGNESIUM OXIDE -MG SUPPLEMENT 500 MG PO CAPS: 1.0000 | ORAL_CAPSULE | Freq: Two times a day (BID) | ORAL | 99 refills | Status: DC

## 2016-03-26 NOTE — Progress Notes (Signed)
Nursing Pain Medication Assessment:  Safety precautions to be maintained throughout the outpatient stay will include: orient to surroundings, keep bed in low position, maintain call bell within reach at all times, provide assistance with transfer out of bed and ambulation.  Medication Inspection Compliance: Pill count conducted under aseptic conditions, in front of the patient. Neither the pills nor the bottle was removed from the patient's sight at any time. Once count was completed pills were immediately returned to the patient in their original bottle.  Medication: Oxycodone IR Pill/Patch Count: 57 of 120 pills remain Bottle Appearance: Standard pharmacy container. Clearly labeled. Filled Date: 02/27 / 2018 Last Medication intake:  Today

## 2016-03-26 NOTE — Patient Instructions (Addendum)
Pain Score  Introduction: The pain score used by this practice is the Verbal Numerical Rating Scale (VNRS-11). This is an 11-point scale. It is for adults and children 10 years or older. There are significant differences in how the pain score is reported, used, and applied. Forget everything you learned in the past and learn this scoring system.  General Information: The scale should reflect your current level of pain. Unless you are specifically asked for the level of your worst pain, or your average pain. If you are asked for one of these two, then it should be understood that it is over the past 24 hours.  Basic Activities of Daily Living (ADL): Personal hygiene, dressing, eating, transferring, and using restroom.  Instructions: Most patients tend to report their level of pain as a combination of two factors, their physical pain and their psychosocial pain. This last one is also known as "suffering" and it is reflection of how physical pain affects you socially and psychologically. From now on, report them separately. From this point on, when asked to report your pain level, report only your physical pain. Use the following table for reference.  Pain Clinic Pain Levels (0-5/10)  Pain Level Score Description  No Pain 0   Mild pain 1 Nagging, annoying, but does not interfere with basic activities of daily living (ADL). Patients are able to eat, bathe, get dressed, toileting (being able to get on and off the toilet and perform personal hygiene functions), transfer (move in and out of bed or a chair without assistance), and maintain continence (able to control bladder and bowel functions). Blood pressure and heart rate are unaffected. A normal heart rate for a healthy adult ranges from 60 to 100 bpm (beats per minute).   Mild to moderate pain 2 Noticeable and distracting. Impossible to hide from other people. More frequent flare-ups. Still possible to adapt and function close to normal. It can be very  annoying and may have occasional stronger flare-ups. With discipline, patients may get used to it and adapt.   Moderate pain 3 Interferes significantly with activities of daily living (ADL). It becomes difficult to feed, bathe, get dressed, get on and off the toilet or to perform personal hygiene functions. Difficult to get in and out of bed or a chair without assistance. Very distracting. With effort, it can be ignored when deeply involved in activities.   Moderately severe pain 4 Impossible to ignore for more than a few minutes. With effort, patients may still be able to manage work or participate in some social activities. Very difficult to concentrate. Signs of autonomic nervous system discharge are evident: dilated pupils (mydriasis); mild sweating (diaphoresis); sleep interference. Heart rate becomes elevated (>115 bpm). Diastolic blood pressure (lower number) rises above 100 mmHg. Patients find relief in laying down and not moving.   Severe pain 5 Intense and extremely unpleasant. Associated with frowning face and frequent crying. Pain overwhelms the senses.  Ability to do any activity or maintain social relationships becomes significantly limited. Conversation becomes difficult. Pacing back and forth is common, as getting into a comfortable position is nearly impossible. Pain wakes you up from deep sleep. Physical signs will be obvious: pupillary dilation; increased sweating; goosebumps; brisk reflexes; cold, clammy hands and feet; nausea, vomiting or dry heaves; loss of appetite; significant sleep disturbance with inability to fall asleep or to remain asleep. When persistent, significant weight loss is observed due to the complete loss of appetite and sleep deprivation.  Blood pressure and heart   rate becomes significantly elevated. Caution: If elevated blood pressure triggers a pounding headache associated with blurred vision, then the patient should immediately seek attention at an urgent or  emergency care unit, as these may be signs of an impending stroke.    Emergency Department Pain Levels (6-10/10)  Emergency Room Pain 6 Severely limiting. Requires emergency care and should not be seen or managed at an outpatient pain management facility. Communication becomes difficult and requires great effort. Assistance to reach the emergency department may be required. Facial flushing and profuse sweating along with potentially dangerous increases in heart rate and blood pressure will be evident.   Distressing pain 7 Self-care is very difficult. Assistance is required to transport, or use restroom. Assistance to reach the emergency department will be required. Tasks requiring coordination, such as bathing and getting dressed become very difficult.   Disabling pain 8 Self-care is no longer possible. At this level, pain is disabling. The individual is unable to do even the most "basic" activities such as walking, eating, bathing, dressing, transferring to a bed, or toileting. Fine motor skills are lost. It is difficult to think clearly.   Incapacitating pain 9 Pain becomes incapacitating. Thought processing is no longer possible. Difficult to remember your own name. Control of movement and coordination are lost.   The worst pain imaginable 10 At this level, most patients pass out from pain. When this level is reached, collapse of the autonomic nervous system occurs, leading to a sudden drop in blood pressure and heart rate. This in turn results in a temporary and dramatic drop in blood flow to the brain, leading to a loss of consciousness. Fainting is one of the body's self defense mechanisms. Passing out puts the brain in a calmed state and causes it to shut down for a while, in order to begin the healing process.    Summary: 1. Refer to this scale when providing [place] with your pain level. 2. Be accurate and careful when reporting your pain level. This will help with your care. 3. Over-reporting  your pain level will lead to loss of credibility. 4. Even a level of 1/10 means that there is pain and will be treated at our facility. 5. High, inaccurate reporting will be documented as "Symptom Exaggeration", leading to loss of credibility and suspicions of possible secondary gains such as obtaining more narcotics, or wanting to appear disabled, for fraudulent reasons. 6. Only pain levels of 5 or below will be seen at our facility. 7. Pain levels of 6 and above will be sent to the Emergency Department and the appointment cancelled. _____________________________________________________________________________________________  Preparing for Procedure with Sedation Instructions: . Oral Intake: Do not eat or drink anything for at least 8 hours prior to your procedure. . Transportation: Public transportation is not allowed. Bring an adult driver. The driver must be physically present in our waiting room before any procedure can be started. [place] Physical Assistance: Bring an adult physically capable of assisting you, in the event you need help. This adult should keep you company at home for at least 6 hours after the procedure. . Blood Pressure Medicine: Take your blood pressure medicine with a sip of water the morning of the procedure. . Blood thinners:  . Diabetics on insulin: Notify the staff so that you can be scheduled 1st case in the morning. If your diabetes requires high dose insulin, take only  of your normal insulin dose the morning of the procedure and notify the staff that you have done  so. . Preventing infections: Shower with an antibacterial soap the morning of your procedure. . Build-up your immune system: Take 1000 mg of Vitamin C with every meal (3 times a day) the day prior to your procedure. [place] Antibiotics: Inform the staff if you have a condition or reason that requires you to take antibiotics before dental procedures. . Pregnancy: If you are pregnant, call and cancel the  procedure. . Sickness: If you have a cold, fever, or any active infections, call and cancel the procedure. . Arrival: You must be in the facility at least 30 minutes prior to your scheduled procedure. . Children: Do not bring children with you. . Dress appropriately: Bring dark clothing that you would not mind if they get stained. . Valuables: Do not bring any jewelry or valuables. Procedure appointments are reserved for interventional treatments only. [place] No Prescription Refills. . No medication changes will be discussed during procedure appointments. . No disability issues will be discussed. You were given 3 prescription for Oxycodone today, One prescription for Magnesium was sent to your pharmacy. Do not take any insulin until after your procedure.  ____________________________________________________________________________________________

## 2016-03-26 NOTE — Progress Notes (Signed)
Patient's Name: Manuel Knight  MRN: [id number]  Referring Provider: [facility]  DOB: 08/02/1960  PCP: [facility]  DOS: 03/26/2016  Note by: Kathlen Brunswick. Dossie Arbour, MD  Service setting: Ambulatory outpatient  Specialty: Interventional Pain Management  Location: [facility] (AMB) Pain Management Facility    Patient type: Established   Primary Reason(s) for Visit: Encounter for prescription drug management (Level of risk: moderate) CC: Back Pain (lower) and Knee Pain (left)  HPI  [name] is a 56 y.o. year old, male patient, who comes today for a medication management evaluation. He has Type 2 diabetes mellitus with hyperglycemia, with long-term current use of insulin ([place]); HLD (hyperlipidemia); Obesity, unspecified; Anxiety state; Essential hypertension; COPD; PULMONARY NODULE; HERNIA; GERD (gastroesophageal reflux disease); Esophageal dysphagia; NSTEMI (non-ST elevated myocardial infarction) ([place]); CAD (coronary artery disease), native coronary artery; S/P CABG x 3; Chronic systolic heart failure ([place]); Emphysema of lung ([place]); Lumbar spondylosis with lumbar radiculitis (Left); H/O coronary artery bypass surgery; Encounter for pain management planning; Encounter for therapeutic drug level monitoring; Opiate use (90 MME/Day); Chronic low back pain (Location of Secondary source of pain) (Bilateral) (L>R); Chronic knee pain (Location of Primary Source of Pain) (Bilateral) (L>R); Chronic foot pain (Bilateral) (L>R); Disturbance of skin sensation; Diabetic peripheral neuropathy (HCC) (lower extremity); Chronic lumbar radicular pain (L5 dermatome) (Location of Tertiary source of pain) (Left); 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) (L>R); Osteoarthritis of knee (Bilateral) (L>R); Lumbar facet syndrome (Location of Secondary source of pain) (Bilateral) (L>R); Lumbar facet arthropathy; Chronic lower extremity pain (Location of Tertiary  source of pain) (Bilateral) (L>R); History of marijuana use; Hypomagnesemia; Vitamin D insufficiency; Long term current use of opiate analgesic; Long term prescription opiate use; Chronic pain syndrome; and SIRS (systemic inflammatory response syndrome) ([place]) on his problem list. His primarily concern today is the Back Pain (lower) and Knee Pain (left)  Pain Assessment: Self-Reported Pain Score: 5 /10 Clinically the patient looks like a 2/10 Reported level is inconsistent with clinical observations. Information on the proper use of the pain scale provided to the patient today Pain Type: Chronic pain Pain Location: Back Pain Orientation: Lower Pain Descriptors / Indicators:  (steady) Pain Frequency: Constant  Mr. Willers was last scheduled for an appointment on 01/28/2016 for medication management. During today's appointment we reviewed Manuel Knight's chronic pain status, as well as his outpatient medication regimen.  The patient  reports that he does not use drugs. His body mass index is 35.74 kg/m.  Further details on both, my assessment(s), as well as the proposed treatment plan, please see below.  Controlled Substance Pharmacotherapy Assessment REMS (Risk Evaluation and Mitigation Strategy)  Analgesic:Oxycodone IR 15 mg every 6 hours (60 mg/day) MME/day:90 mg/day. [name], RN  03/26/2016 10:22 AM  Sign at close encounter Nursing Pain Medication Assessment:  Safety precautions to be maintained throughout the outpatient stay will include: orient to surroundings, keep bed in low position, maintain call bell within reach at all times, provide assistance with transfer out of bed and ambulation.  Medication Inspection Compliance: Pill count conducted under aseptic conditions, in front of the patient. Neither the pills nor the bottle was removed from the patient's sight at any time. Once count was completed pills were immediately returned to the patient in their original  bottle.  Medication: Oxycodone IR Pill/Patch Count: 57 of 120 pills remain Bottle Appearance: Standard pharmacy container. Clearly labeled. Filled Date: 02/27 / 2018 Last Medication intake:  Today   Pharmacokinetics: Liberation and absorption (onset of action): WNL Distribution (time to peak effect): WNL Metabolism and excretion (duration of action): WNL         Pharmacodynamics: Desired effects: Analgesia: Manuel Knight reports >50% benefit. Functional ability: Patient reports that medication allows him to accomplish basic ADLs Clinically meaningful improvement in function (CMIF): Sustained CMIF goals met Perceived effectiveness: Described as relatively effective, allowing for increase in activities of daily living (ADL) Undesirable effects: Side-effects or Adverse reactions: None reported Monitoring: [place] PMP: Online review of the past 50-monthperiod conducted. Compliant with practice rules and regulations List of all UDS test(s) done:  Lab Results  Component Value Date   TOXASSSELUR FINAL 08/27/2015   SUMMARY FINAL 06/13/2015   Last UDS on record: ToxAssure Select 13  Date Value Ref Range Status  08/27/2015 FINAL  Final    Comment:    ==================================================================== TOXASSURE SELECT 13 (MW) ==================================================================== Test                             Result       Flag       Units Drug Present and Declared for Prescription Verification   Alprazolam                     128          EXPECTED   ng/mg creat   Alpha-hydroxyalprazolam        63           EXPECTED   ng/mg creat    Source of alprazolam is a scheduled prescription medication.    Alpha-hydroxyalprazolam is an expected metabolite of alprazolam.   Oxycodone                      1064         EXPECTED   ng/mg creat   Oxymorphone                    107          EXPECTED   ng/mg creat   Noroxycodone                   576          EXPECTED   ng/mg  creat    Sources of oxycodone include scheduled prescription medications.    Oxymorphone and noroxycodone are expected metabolites of    oxycodone. Oxymorphone is also available as a scheduled    prescription medication. Drug Present not Declared for Prescription Verification   Alcohol, Ethyl                 >0.400       UNEXPECTED g/dL    Sources of ethyl alcohol include alcoholic beverages or as a    fermentation product of glucose; glucose is present in this    specimen.  The high concentration of ethyl alcohol and the    presence of glucose supports fermentation as the source of ethyl    alcohol in this specimen. Drug Absent but Declared for Prescription Verification   Amphetamine                    Not Detected UNEXPECTED ng/mg creat ==================================================================== Test                      Result    Flag  Units      Ref Range   Creatinine              102              mg/dL      >=20 ==================================================================== Declared Medications:  The flagging and interpretation on this report are based on the  following declared medications.  Unexpected results may arise from  inaccuracies in the declared medications.  **Note: The testing scope of this panel includes these medications:  Alprazolam (Xanax)  Amphetamine (Adderall)  Oxycodone (Roxicodone)  **Note: The testing scope of this panel does not include following  reported medications:  Albuterol (ProAir)  Albuterol (Proventil)  Amlodipine (Norvasc)  Aspirin  Atorvastatin (Lipitor)  Benazepril (Lotensin)  Canagliflozin (Invokana)  Carvedilol (Coreg)  Citalopram (Celexa)  Clonidine (Catapres)  Duloxetine (Cymbalta)  Fluticasone (Advair)  Gabapentin  Insulin (Levemir)  Insulin (NovoLog)  Magnesium  Meloxicam (Mobic)  Metformin (Glucophage)  Mupirocin (Bactroban)  Naloxone  Nitroglycerin (Nitrostat)  Pantoprazole (Protonix)  Sacubitril  (Entresto)  Salmeterol (Advair)  Tiotropium (Spiriva)  Valsartan (Entresto)  Vitamin D2 (Drisdol) ==================================================================== For clinical consultation, please call [phone]. ====================================================================    UDS interpretation: Compliant          Medication Assessment Form: Reviewed. Patient indicates being compliant with therapy Treatment compliance: Compliant Risk Assessment Profile: Aberrant behavior: See prior evaluations. None observed or detected today Comorbid factors increasing risk of overdose: See prior notes. No additional risks detected today Risk of substance use disorder (SUD): Low Opioid Risk Tool (ORT) Total Score: 0  Interpretation Table:  Score <3 = Low Risk for SUD  Score between 4-7 = Moderate Risk for SUD  Score >8 = High Risk for Opioid Abuse   Risk Mitigation Strategies:  Patient Counseling: Covered Patient-Prescriber Agreement (PPA): Present and active  Notification to other healthcare providers: Done  Pharmacologic Plan: No change in therapy, at this time  Laboratory Chemistry  Inflammation Markers Lab Results  Component Value Date   CRP 0.8 06/13/2015   ESRSEDRATE 2 06/13/2015   (CRP: Acute Phase) (ESR: Chronic Phase) Renal Function Markers Lab Results  Component Value Date   BUN 15 06/13/2015   CREATININE 0.98 06/13/2015   GFRAA >60 06/13/2015   GFRNONAA >60 06/13/2015   Hepatic Function Markers Lab Results  Component Value Date   AST 17 06/13/2015   ALT 19 06/13/2015   ALBUMIN 4.0 06/13/2015   ALKPHOS 58 06/13/2015   HCVAB NEGATIVE 11/22/2008   Electrolytes Lab Results  Component Value Date   NA 138 06/13/2015   K 4.6 06/13/2015   CL 107 06/13/2015   CALCIUM 9.4 06/13/2015   MG 1.4 (L) 06/13/2015   Neuropathy Markers Lab Results  Component Value Date   VITAMINB12 1,002 (H) 06/13/2015   Bone Pathology Markers Lab Results  Component  Value Date   ALKPHOS 58 06/13/2015   VD25OH 31 02/03/2010   25OHVITD1 25 (L) 06/13/2015   25OHVITD2 <1.0 06/13/2015   25OHVITD3 25 06/13/2015   CALCIUM 9.4 06/13/2015   Coagulation Parameters Lab Results  Component Value Date   INR 1.59 (H) 02/06/2013   LABPROT 18.5 (H) 02/06/2013   APTT 40 (H) 02/06/2013   PLT 307 03/15/2015   Cardiovascular Markers Lab Results  Component Value Date   HGB 14.5 03/15/2015   HCT 45.5 03/15/2015   Note: Lab results reviewed.  Recent Diagnostic Imaging Review  Dg C-arm 1-60 Min-no Report  Result Date: 12/23/2015 There is no Radiologist interpretation  for this  exam.  Note: Imaging results reviewed.          Meds  The patient has a current medication list which includes the following prescription(s): albuterol, albuterol, alprazolam, amlodipine, amphetamine-dextroamphetamine, aspirin ec, atorvastatin, benazepril, canagliflozin, carvedilol, citalopram, clonidine, d3 super strength, duloxetine, fluticasone-salmeterol, gabapentin, gabapentin, insulin aspart, insulin pen needle, insulin syringe-needle u-100, insulin syringe-needle u-100, levemir flextouch, magnesium oxide, meloxicam, metformin, naloxone, naloxone, nitroglycerin, oxycodone, oxycodone, oxycodone, pantoprazole, sacubitril-valsartan, sildenafil, sure comfort lancets 30g, tiotropium, and truetrack test.  Current Outpatient Prescriptions on File Prior to Visit  Medication Sig  . albuterol (PROAIR HFA) 108 (90 Base) MCG/ACT inhaler Inhale 1 puff into the lungs daily.   Marland Kitchen albuterol (PROVENTIL) (2.5 MG/3ML) 0.083% nebulizer solution Inhale 2.5 mg into the lungs 2 (two) times daily as needed.   Marland Kitchen alprazolam (XANAX) 2 MG tablet Take 1 tablet by mouth 4 (four) times daily as needed.   Marland Kitchen amLODipine (NORVASC) 10 MG tablet Take 10 mg by mouth daily.  Marland Kitchen amphetamine-dextroamphetamine (ADDERALL) 20 MG tablet Take 20 mg by mouth 3 (three) times daily.   Marland Kitchen aspirin EC 81 MG tablet Take 1 tablet (81 mg  total) by mouth daily.  Marland Kitchen atorvastatin (LIPITOR) 80 MG tablet Take 1 tablet by mouth daily.  . benazepril (LOTENSIN) 20 MG tablet Take 20 mg by mouth daily.   . Canagliflozin (INVOKANA) 100 MG TABS Take 100 mg by mouth daily.  . carvedilol (COREG) 25 MG tablet Take 25 mg by mouth 2 (two) times daily with a meal.   . citalopram (CELEXA) 20 MG tablet Take 1 tablet by mouth daily.  . cloNIDine (CATAPRES) 0.3 MG tablet Take 0.3 mg by mouth 2 (two) times daily.  . D3 SUPER STRENGTH 2000 units CAPS Take 1 capsule by mouth daily.  . DULoxetine (CYMBALTA) 30 MG capsule 1 tab daily for one day, then 2 tabs daily  . Fluticasone-Salmeterol (ADVAIR DISKUS) 500-50 MCG/DOSE AEPB Inhale 1 puff into the lungs every 12 (twelve) hours.    . gabapentin (NEURONTIN) 600 MG tablet Take 600 mg by mouth 3 (three) times daily.  . insulin aspart (NOVOLOG FLEXPEN) 100 UNIT/ML FlexPen Inject 0-26 Units into the skin 3 (three) times daily with meals. 20 units three times a day with sliding scale max of 26 units  . Insulin Pen Needle (LITETOUCH PEN NEEDLES) 29G X 12.7MM MISC Use as directed.  . Insulin Syringe-Needle U-100 (B-D INS SYR ULTRAFINE 1CC/31G) 31G X 5/16" 1 ML MISC   . Insulin Syringe-Needle U-100 (B-D INS SYRINGE 0.5CC/31GX5/16) 31G X 5/16" 0.5 ML MISC   . LEVEMIR FLEXTOUCH 100 UNIT/ML Pen INJECT 20 UNITS EVERY MORNING.  . meloxicam (MOBIC) 15 MG tablet Take 15 mg by mouth 2 (two) times daily.  . metFORMIN (GLUCOPHAGE) 1000 MG tablet Take 1,000 mg by mouth 2 (two) times daily.   . naloxone (NARCAN) 2 MG/2ML injection Inject content of syringe into thigh muscle. Call 911.  . naloxone HCl 4 MG/0.1ML LIQD One spray in either nostril once for known/suspected opioid overdose. May repeat every 2-3 minutes in alternating nostril til EMS arrives  . nitroGLYCERIN (NITROSTAT) 0.4 MG SL tablet Place 1 tablet (0.4 mg total) under the tongue every 5 (five) minutes as needed for chest pain.  . pantoprazole (PROTONIX) 40 MG  tablet 1 tablet Two (2) times a day.  . sacubitril-valsartan (ENTRESTO) 97-103 MG Take 1 tablet by mouth 2 (two) times daily.  . SURE COMFORT LANCETS 30G MISC USE TO CHECK SUGAR THREE TIMES DAILY {AS DIRECTED]  .  tiotropium (SPIRIVA) 18 MCG inhalation capsule Place 18 mcg into inhaler and inhale daily.    Angelia Mould TEST test strip USE TO CHECK BLOOD SUGAR THREE TIMES DAILY   No current facility-administered medications on file prior to visit.    ROS  Constitutional: Denies any fever or chills Gastrointestinal: No reported hemesis, hematochezia, vomiting, or acute GI distress Musculoskeletal: Denies any acute onset joint swelling, redness, loss of ROM, or weakness Neurological: No reported episodes of acute onset apraxia, aphasia, dysarthria, agnosia, amnesia, paralysis, loss of coordination, or loss of consciousness  Allergies  Mr. Parham is allergic to lodine [etodolac]; tylenol [acetaminophen]; iodine; and propoxyphene n-acetaminophen.  [place]  Drug: Mr. Wilber  reports that he does not use drugs. Alcohol:  reports that he does not drink alcohol. Tobacco:  reports that he quit smoking about 27 years ago. His smoking use included Cigarettes. He started smoking about 49 years ago. He has a 28.50 pack-year smoking history. He has never used smokeless tobacco. Medical:  has a past medical history of Anxiety; Cellulitis (2011); Chronic back pain; Claustrophobia; COPD (chronic obstructive pulmonary disease) ([place]); Diabetes mellitus, type 2 ([place]); Diverticulitis of colon (2008); GERD (gastroesophageal reflux disease); Heart attack; Hyperlipidemia; Hypertension; Obesity; and Pulmonary nodule. Family: family history includes Cancer in his mother; Coronary artery disease in his mother and sister; Diabetes in his sister; Heart attack in his brother and father.  Past Surgical History:  Procedure Laterality Date  . APPENDECTOMY  2007   gangrenous appendicitis   . CORONARY ARTERY BYPASS GRAFT N/A  02/06/2013   Procedure: CORONARY ARTERY BYPASS GRAFTING (CABG) with TEE;  Surgeon: [name], MD;  Location: [facility];  Service: Open Heart Surgery;  Laterality: N/A;  CABG x three,  using left internal mammary artery and right leg greater saphenous vein harvested endoscopically  . LEFT HEART CATHETERIZATION WITH CORONARY ANGIOGRAM N/A 02/03/2013   Procedure: LEFT HEART CATHETERIZATION WITH CORONARY ANGIOGRAM;  Surgeon: Jettie Booze, MD;  Location[facility];  Service: Cardiovascular;  Laterality: N/A;  . TONSILLECTOMY     Constitutional Exam  General appearance: Well nourished, well developed, and well hydrated. In no apparent acute distress Vitals:   03/26/16 1015  BP: (!) 149/79  Pulse: 91  Resp: 16  Temp: 97.8 F (36.6 C)  TempSrc: Oral  SpO2: 96%  Weight: 242 lb (109.8 kg)  Height: '5\' 9"'  (1.753 m)   BMI Assessment: Estimated body mass index is 35.74 kg/m as calculated from the following:   Height as of this encounter: '5\' 9"'  (1.753 m).   Weight as of this encounter: 242 lb (109.8 kg).  BMI interpretation table: BMI level Category Range association with higher incidence of chronic pain  <18 kg/m2 Underweight   18.5-24.9 kg/m2 Ideal body weight   25-29.9 kg/m2 Overweight Increased incidence by 20%  30-34.9 kg/m2 Obese (Class I) Increased incidence by 68%  35-39.9 kg/m2 Severe obesity (Class II) Increased incidence by 136%  >40 kg/m2 Extreme obesity (Class III) Increased incidence by 254%   BMI Readings from Last 4 Encounters:  03/26/16 35.74 kg/m  01/28/16 35.74 kg/m  12/23/15 35.74 kg/m  11/28/15 36.33 kg/m   Wt Readings from Last 4 Encounters:  03/26/16 242 lb (109.8 kg)  01/28/16 242 lb (109.8 kg)  12/23/15 242 lb (109.8 kg)  11/28/15 246 lb (111.6 kg)  Psych/Mental status: Alert, oriented x 3 (person, place, & time)       Eyes: PERLA Respiratory: No evidence of acute respiratory distress  Cervical  Spine Exam  Inspection: No masses, redness, or  swelling Alignment: Symmetrical Functional ROM: Unrestricted ROM Stability: No instability detected Muscle strength & Tone: Functionally intact Sensory: Unimpaired Palpation: Non-contributory  Upper Extremity (UE) Exam    Side: Right upper extremity  Side: Left upper extremity  Inspection: No masses, redness, swelling, or asymmetry. No contractures  Inspection: No masses, redness, swelling, or asymmetry. No contractures  Functional ROM: Unrestricted ROM          Functional ROM: Unrestricted ROM          Muscle strength & Tone: Functionally intact  Muscle strength & Tone: Functionally intact  Sensory: Unimpaired  Sensory: Unimpaired  Palpation: Euthermic  Palpation: Euthermic  Specialized Test(s): Deferred         Specialized Test(s): Deferred          Thoracic Spine Exam  Inspection: No masses, redness, or swelling Alignment: Symmetrical Functional ROM: Unrestricted ROM Stability: No instability detected Sensory: Unimpaired Muscle strength & Tone: Functionally intact Palpation: Non-contributory  Lumbar Spine Exam  Inspection: No masses, redness, or swelling Alignment: Symmetrical Functional ROM: Decreased ROM Stability: No instability detected Muscle strength & Tone: Functionally intact Sensory: Movement-associated pain Palpation: Complains of area being tender to palpation Provocative Tests: Lumbar Hyperextension and rotation test: Positive bilaterally for facet joint pain. Patrick's Maneuver: Positive for bilateral S-I joint pain           Gait & Posture Assessment  Ambulation: Unassisted Gait: Relatively normal for age and body habitus Posture: WNL   Lower Extremity Exam    Side: Right lower extremity  Side: Left lower extremity  Inspection: No masses, redness, swelling, or asymmetry. No contractures  Inspection: No masses, redness, swelling, or asymmetry. No contractures  Functional ROM: Unrestricted ROM          Functional ROM: Unrestricted ROM          Muscle  strength & Tone: Functionally intact  Muscle strength & Tone: Functionally intact  Sensory: Unimpaired  Sensory: Unimpaired  Palpation: No palpable anomalies  Palpation: No palpable anomalies   Assessment  Primary Diagnosis & Pertinent Problem List: The primary encounter diagnosis was Chronic pain syndrome. Diagnoses of Chronic knee pain (Location of Primary Source of Pain) (Bilateral) (L>R), Chronic low back pain (Location of Secondary source of pain) (Bilateral) (L>R), Chronic lower extremity pain (Location of Tertiary source of pain) (Bilateral) (L>R), Chronic lumbar radicular pain (L5 dermatome) (Location of Tertiary source of pain) (Left), Long term current use of opiate analgesic, Opiate use (90 MME/Day), and Hypomagnesemia were also pertinent to this visit.  Status Diagnosis  Controlled Controlled Controlled 1. Chronic pain syndrome   2. Chronic knee pain (Location of Primary Source of Pain) (Bilateral) (L>R)   3. Chronic low back pain (Location of Secondary source of pain) (Bilateral) (L>R)   4. Chronic lower extremity pain (Location of Tertiary source of pain) (Bilateral) (L>R)   5. Chronic lumbar radicular pain (L5 dermatome) (Location of Tertiary source of pain) (Left)   6. Long term current use of opiate analgesic   7. Opiate use (90 MME/Day)   8. Hypomagnesemia      Plan of Care  Pharmacotherapy (Medications Ordered): Meds ordered this encounter  Medications  . oxyCODONE (ROXICODONE) 15 MG immediate release tablet    Sig: Take 1 tablet (15 mg total) by mouth every 6 (six) hours as needed for pain.    Dispense:  120 tablet    Refill:  0    Do not add this medication  to the electronic "Automatic Refill" notification system. Patient may have prescription filled one day early if pharmacy is closed on scheduled refill date. Do not fill until: 05/09/16 To last until: 06/08/16  . oxyCODONE (ROXICODONE) 15 MG immediate release tablet    Sig: Take 1 tablet (15 mg total) by mouth  every 6 (six) hours as needed.    Dispense:  120 tablet    Refill:  0    Do not add this medication to the electronic "Automatic Refill" notification system. Patient may have prescription filled one day early if pharmacy is closed on scheduled refill date. Do not fill until: 04/09/16 To last until: 05/09/16  . Magnesium Oxide 500 MG CAPS    Sig: Take 1 capsule (500 mg total) by mouth 2 (two) times daily at 8 am and 10 pm.    Dispense:  100 capsule    Refill:  PRN    Do not place this medication, or any other prescription from our practice, on "Automatic Refill". Patient may have prescription filled one day early if pharmacy is closed on scheduled refill date.  Marland Kitchen oxyCODONE (ROXICODONE) 15 MG immediate release tablet    Sig: Take 1 tablet (15 mg total) by mouth every 6 (six) hours as needed for pain.    Dispense:  120 tablet    Refill:  0    Do not add this medication to the electronic "Automatic Refill" notification system. Patient may have prescription filled one day early if pharmacy is closed on scheduled refill date. Do not fill until: 06/08/16 To last until: 07/08/16   New Prescriptions   No medications on file   Medications administered today: Mr. Sellitto had no medications administered during this visit. Lab-work, procedure(s), and/or referral(s): No orders of the defined types were placed in this encounter.  Imaging and/or referral(s): None  Interventional therapies: Planned, scheduled, and/or pending:   Diagnostic Bilateral Lumbar Facet Block + Bilateral SI Radiofrequency ablation, starting with the left side.    Considering:   Diagnostic bilateral intra-articular knee injection Diagnostic bilateral genicular nerve block Possible bilateral genicular nerve radiofrequency ablation  Diagnostic bilateral lumbar facet block Possible bilateral lumbar facet radiofrequency ablation Diagnostic left-sided L4-5 lumbar epidural steroid injection Diagnostic bilateral sacroiliac  joint block Possible bilateral sacroiliac joint radiofrequency ablation   Palliative PRN treatment(s):   Diagnostic bilateral intra-articular knee injection Diagnostic bilateral genicular nerve block Diagnostic bilateral lumbar facet block Diagnostic left-sided L4-5 lumbar epidural steroid injection Diagnostic bilateral sacroiliac joint block   Provider-requested follow-up: Return in about 3 months (around 06/26/2016) for (Nurse Practitioner) Med-Mgmt, in addition, Procedure.  Future Appointments Date Time Provider [place]  03/30/2016 8:45 AM Milinda Pointer, MD [facility] None   Primary Care Physician: [facility] Location: [facility] Note by: Kathlen Brunswick. Dossie Arbour, M.D, DABA, DABAPM, DABPM, DABIPP, FIPP Date: 03/26/2016; Time: 3:57 PM  Pain Score Disclaimer: We use the NRS-11 scale. This is a self-reported, subjective measurement of pain severity with only modest accuracy. It is used primarily to identify changes within a particular patient. It must be understood that outpatient pain scales are significantly less accurate that those used for research, where they can be applied under ideal controlled circumstances with minimal exposure to variables. In reality, the score is likely to be a combination of pain intensity and pain affect, where pain affect describes the degree of emotional arousal or changes in action readiness caused by the sensory experience of pain. Factors such as social and  work situation, setting, emotional state, anxiety levels, expectation, and prior pain experience may influence pain perception and show large inter-individual differences that may also be affected by time variables.  Patient instructions provided during this appointment: Patient Instructions   Pain Score  Introduction: The pain score used by this practice is the Verbal Numerical Rating Scale (VNRS-11). This is an 11-point scale. It is  for adults and children 10 years or older. There are significant differences in how the pain score is reported, used, and applied. Forget everything you learned in the past and learn this scoring system.  General Information: The scale should reflect your current level of pain. Unless you are specifically asked for the level of your worst pain, or your average pain. If you are asked for one of these two, then it should be understood that it is over the past 24 hours.  Basic Activities of Daily Living (ADL): Personal hygiene, dressing, eating, transferring, and using restroom.  Instructions: Most patients tend to report their level of pain as a combination of two factors, their physical pain and their psychosocial pain. This last one is also known as "suffering" and it is reflection of how physical pain affects you socially and psychologically. From now on, report them separately. From this point on, when asked to report your pain level, report only your physical pain. Use the following table for reference.  Pain Clinic Pain Levels (0-5/10)  Pain Level Score Description  No Pain 0   Mild pain 1 Nagging, annoying, but does not interfere with basic activities of daily living (ADL). Patients are able to eat, bathe, get dressed, toileting (being able to get on and off the toilet and perform personal hygiene functions), transfer (move in and out of bed or a chair without assistance), and maintain continence (able to control bladder and bowel functions). Blood pressure and heart rate are unaffected. A normal heart rate for a healthy adult ranges from 60 to 100 bpm (beats per minute).   Mild to moderate pain 2 Noticeable and distracting. Impossible to hide from other people. More frequent flare-ups. Still possible to adapt and function close to normal. It can be very annoying and may have occasional stronger flare-ups. With discipline, patients may get used to it and adapt.   Moderate pain 3 Interferes  significantly with activities of daily living (ADL). It becomes difficult to feed, bathe, get dressed, get on and off the toilet or to perform personal hygiene functions. Difficult to get in and out of bed or a chair without assistance. Very distracting. With effort, it can be ignored when deeply involved in activities.   Moderately severe pain 4 Impossible to ignore for more than a few minutes. With effort, patients may still be able to manage work or participate in some social activities. Very difficult to concentrate. Signs of autonomic nervous system discharge are evident: dilated pupils (mydriasis); mild sweating (diaphoresis); sleep interference. Heart rate becomes elevated (>115 bpm). Diastolic blood pressure (lower number) rises above 100 mmHg. Patients find relief in laying down and not moving.   Severe pain 5 Intense and extremely unpleasant. Associated with frowning face and frequent crying. Pain overwhelms the senses.  Ability to do any activity or maintain social relationships becomes significantly limited. Conversation becomes difficult. Pacing back and forth is common, as getting into a comfortable position is nearly impossible. Pain wakes you up from deep sleep. Physical signs will be obvious: pupillary dilation; increased sweating; goosebumps; brisk reflexes; cold, clammy hands and feet; nausea,  vomiting or dry heaves; loss of appetite; significant sleep disturbance with inability to fall asleep or to remain asleep. When persistent, significant weight loss is observed due to the complete loss of appetite and sleep deprivation.  Blood pressure and heart rate becomes significantly elevated. Caution: If elevated blood pressure triggers a pounding headache associated with blurred vision, then the patient should immediately seek attention at an urgent or emergency care unit, as these may be signs of an impending stroke.    Emergency Department Pain Levels (6-10/10)  Emergency Room Pain 6  Severely limiting. Requires emergency care and should not be seen or managed at an outpatient pain management facility. Communication becomes difficult and requires great effort. Assistance to reach the emergency department may be required. Facial flushing and profuse sweating along with potentially dangerous increases in heart rate and blood pressure will be evident.   Distressing pain 7 Self-care is very difficult. Assistance is required to transport, or use restroom. Assistance to reach the emergency department will be required. Tasks requiring coordination, such as bathing and getting dressed become very difficult.   Disabling pain 8 Self-care is no longer possible. At this level, pain is disabling. The individual is unable to do even the most "basic" activities such as walking, eating, bathing, dressing, transferring to a bed, or toileting. Fine motor skills are lost. It is difficult to think clearly.   Incapacitating pain 9 Pain becomes incapacitating. Thought processing is no longer possible. Difficult to remember your own name. Control of movement and coordination are lost.   The worst pain imaginable 10 At this level, most patients pass out from pain. When this level is reached, collapse of the autonomic nervous system occurs, leading to a sudden drop in blood pressure and heart rate. This in turn results in a temporary and dramatic drop in blood flow to the brain, leading to a loss of consciousness. Fainting is one of the body's self defense mechanisms. Passing out puts the brain in a calmed state and causes it to shut down for a while, in order to begin the healing process.    Summary: 1. Refer to this scale when providing [place] with your pain level. 2. Be accurate and careful when reporting your pain level. This will help with your care. 3. Over-reporting your pain level will lead to loss of credibility. 4. Even a level of 1/10 means that there is pain and will be treated at our  facility. 5. High, inaccurate reporting will be documented as "Symptom Exaggeration", leading to loss of credibility and suspicions of possible secondary gains such as obtaining more narcotics, or wanting to appear disabled, for fraudulent reasons. 6. Only pain levels of 5 or below will be seen at our facility. 7. Pain levels of 6 and above will be sent to the Emergency Department and the appointment cancelled. _____________________________________________________________________________________________  Preparing for Procedure with Sedation Instructions: . Oral Intake: Do not eat or drink anything for at least 8 hours prior to your procedure. . Transportation: Public transportation is not allowed. Bring an adult driver. The driver must be physically present in our waiting room before any procedure can be started. [facility] Physical Assistance: Bring an adult physically capable of assisting you, in the event you need help. This adult should keep you company at home for at least 6 hours after the procedure. . Blood Pressure Medicine: Take your blood pressure medicine with a sip of water the morning of the procedure. . Blood thinners:  . Diabetics on insulin: Notify  the staff so that you can be scheduled 1st case in the morning. If your diabetes requires high dose insulin, take only  of your normal insulin dose the morning of the procedure and notify the staff that you have done so. . Preventing infections: Shower with an antibacterial soap the morning of your procedure. . Build-up your immune system: Take 1000 mg of Vitamin C with every meal (3 times a day) the day prior to your procedure. [facility] Kitchen Antibiotics: Inform the staff if you have a condition or reason that requires you to take antibiotics before dental procedures. . Pregnancy: If you are pregnant, call and cancel the procedure. . Sickness: If you have a cold, fever, or any active infections, call and cancel the procedure. . Arrival: You must be in the  facility at least 30 minutes prior to your scheduled procedure. . Children: Do not bring children with you. . Dress appropriately: Bring dark clothing that you would not mind if they get stained. . Valuables: Do not bring any jewelry or valuables. Procedure appointments are reserved for interventional treatments only. Mar[facility] Kitchen No Prescription Refills. . No medication changes will be discussed during procedure appointments. . No disability issues will be discussed. You were given 3 prescription for Oxycodone today, One prescription for Magnesium was sent to your pharmacy. Do not take any insulin until after your procedure.  ____________________________________________________________________________________________

## 2016-03-30 ENCOUNTER — Ambulatory Visit: Payer: Medicare Other | Admitting: Pain Medicine

## 2016-03-30 ENCOUNTER — Ambulatory Visit (HOSPITAL_BASED_OUTPATIENT_CLINIC_OR_DEPARTMENT_OTHER): Payer: Medicare Other | Admitting: Pain Medicine

## 2016-03-30 ENCOUNTER — Ambulatory Visit
Admission: RE | Admit: 2016-03-30 | Discharge: 2016-03-30 | Disposition: A | Discharge status: Home / Self Care | Payer: Medicare Other | Source: Ambulatory Visit | Attending: Pain Medicine | Admitting: Pain Medicine

## 2016-03-30 ENCOUNTER — Encounter: Payer: Self-pay | Admitting: Pain Medicine

## 2016-03-30 VITALS — BP 139/85 | HR 81 | Temp 98.2°F | Resp 8 | Ht 69.5 in | Wt 247.0 lb

## 2016-03-30 DIAGNOSIS — Z886 Allergy status to analgesic agent status: Secondary | ICD-10-CM | POA: Diagnosis not present

## 2016-03-30 DIAGNOSIS — Z888 Allergy status to other drugs, medicaments and biological substances status: Secondary | ICD-10-CM | POA: Insufficient documentation

## 2016-03-30 DIAGNOSIS — G8929 Other chronic pain: Secondary | ICD-10-CM

## 2016-03-30 DIAGNOSIS — M545 Low back pain: Secondary | ICD-10-CM | POA: Diagnosis not present

## 2016-03-30 DIAGNOSIS — G8918 Other acute postprocedural pain: Secondary | ICD-10-CM | POA: Insufficient documentation

## 2016-03-30 DIAGNOSIS — M47816 Spondylosis without myelopathy or radiculopathy, lumbar region: Secondary | ICD-10-CM

## 2016-03-30 DIAGNOSIS — M533 Sacrococcygeal disorders, not elsewhere classified: Secondary | ICD-10-CM | POA: Diagnosis not present

## 2016-03-30 DIAGNOSIS — M1288 Other specific arthropathies, not elsewhere classified, other specified site: Secondary | ICD-10-CM

## 2016-03-30 DIAGNOSIS — G894 Chronic pain syndrome: Secondary | ICD-10-CM | POA: Diagnosis present

## 2016-03-30 LAB — GLUCOSE, CAPILLARY: Glucose-Capillary: 200 mg/dL — ABNORMAL HIGH (ref 65–99)

## 2016-03-30 MED ORDER — ROPIVACAINE HCL 5 MG/ML IJ SOLN
5.0000 mL | Freq: Once | INTRAMUSCULAR | Status: AC
  Administered 2016-03-30: 10 mL via PERINEURAL

## 2016-03-30 MED ORDER — HYDROMORPHONE HCL 2 MG PO TABS: 2.0000 mg | ORAL_TABLET | Freq: Four times a day (QID) | ORAL | 0 refills | Status: DC | PRN

## 2016-03-30 MED ORDER — MIDAZOLAM HCL 5 MG/5ML IJ SOLN
1.0000 mg | INTRAMUSCULAR | Status: DC | PRN
  Administered 2016-03-30: 4 mg via INTRAVENOUS
  Filled 2016-03-30: qty 5

## 2016-03-30 MED ORDER — LACTATED RINGERS IV SOLN
1000.0000 mL | Freq: Once | INTRAVENOUS | Status: AC
  Administered 2016-03-30: 1000 mL via INTRAVENOUS

## 2016-03-30 MED ORDER — LIDOCAINE HCL (PF) 1 % IJ SOLN: 10.0000 mL | Freq: Once | INTRAMUSCULAR | Status: DC

## 2016-03-30 MED ORDER — FENTANYL CITRATE (PF) 100 MCG/2ML IJ SOLN
25.0000 ug | INTRAMUSCULAR | Status: DC | PRN
  Administered 2016-03-30: 100 ug via INTRAVENOUS
  Filled 2016-03-30: qty 2

## 2016-03-30 MED ORDER — ROPIVACAINE HCL 5 MG/ML IJ SOLN
5.0000 mL | Freq: Once | INTRAMUSCULAR | Status: DC
  Filled 2016-03-30: qty 20

## 2016-03-30 MED ORDER — TRIAMCINOLONE ACETONIDE 40 MG/ML IJ SUSP
40.0000 mg | Freq: Once | INTRAMUSCULAR | Status: AC
  Administered 2016-03-30: 40 mg
  Filled 2016-03-30: qty 1

## 2016-03-30 NOTE — Brief Op Note (Signed)
1050 NBS per order 200mg /dl performed per D Moore NA. Dr. Dossie Arbour aware. Orders for patient to take AM meds after procedure today.

## 2016-03-30 NOTE — Progress Notes (Signed)
Dr Dossie Arbour to bedside during recovery for assessment of c/o weak leg.  Rational given as to medications given and no complications noted.  Patient verbalizes u/o numbing medication and that results could last for approx 4 - 6 hours.    Upon d/c patient is able to stand and bare weight on Left leg and ambulate to wheelchair.  Patient cautioned of continued weakness over 4 - 6 hours.

## 2016-03-30 NOTE — Patient Instructions (Addendum)
rx for hydromorphone (Dilaudid) 2 mg x 10 days given to patient.   Post-Procedure instructions Instructions:  Apply ice: Fill a plastic sandwich bag with crushed ice. Cover it with a small towel and apply to injection site. Apply for 15 minutes then remove x 15 minutes. Repeat sequence on day of procedure, until you go to bed. The purpose is to minimize swelling and discomfort after procedure.  Apply heat: Apply heat to procedure site starting the day following the procedure. The purpose is to treat any soreness and discomfort from the procedure.  Food intake: Start with clear liquids (like water) and advance to regular food, as tolerated.   Physical activities: Keep activities to a minimum for the first 8 hours after the procedure.   Driving: If you have received any sedation, you are not allowed to drive for 24 hours after your procedure.  Blood thinner: Restart your blood thinner 6 hours after your procedure. (Only for those taking blood thinners)  Insulin: As soon as you can eat, you may resume your normal dosing schedule. (Only for those taking insulin)  Infection prevention: Keep procedure site clean and dry.  Post-procedure Pain Diary: Extremely important that this be done correctly and accurately. Recorded information will be used to determine the next step in treatment.  Pain evaluated is that of treated area only. Do not include pain from an untreated area.  Complete every hour, on the hour, for the initial 8 hours. Set an alarm to help you do this part accurately.  Do not go to sleep and have it completed later. It will not be accurate.  Follow-up appointment: Keep your follow-up appointment after the procedure. Usually 2 weeks for most procedures. (6 weeks in the case of radiofrequency.) Bring you pain diary.  Expect:  From numbing medicine (AKA: Local Anesthetics): Numbness or decrease in pain.  Onset: Full effect within 15 minutes of injected.  Duration: It will  depend on the type of local anesthetic used. On the average, 1 to 8 hours.   From steroids: Decrease in swelling or inflammation. Once inflammation is improved, relief of the pain will follow.  Onset of benefits: Depends on the amount of swelling present. The more swelling, the longer it will take for the benefits to be seen.   Duration: Steroids will stay in the system x 2 weeks. Duration of benefits will depend on multiple posibilities including persistent irritating factors.  From procedure: Some discomfort is to be expected once the numbing medicine wears off. This should be minimal if ice and heat are applied as instructed. Call if:  You experience numbness and weakness that gets worse with time, as opposed to wearing off.  New onset bowel or bladder incontinence. (Spinal procedures only)  Emergency Numbers:  Durning business hours (Monday - Thursday, 8:00 AM - 4:00 PM) (Friday, 9:00 AM - 12:00 Noon): [phone]  After hours: [phone]   __________________________________________________________________________________________   Pain Management Discharge Instructions  General Discharge Instructions :  If you need to reach your doctor call: Monday-Friday 8:00 am - 4:00 pm at [phone] or toll free [phone].  After clinic hours [phone] to have operator reach doctor.  Bring all of your medication bottles to all your appointments in the pain clinic.  To cancel or reschedule your appointment with Pain Management please remember to call 24 hours in advance to avoid a fee.  Refer to the educational materials which you have been given on: General Risks, I had my Procedure. Discharge Instructions, Post  Sedation.  Post Procedure Instructions:  The drugs you were given will stay in your system until tomorrow, so for the next 24 hours you should not drive, make any legal decisions or drink any alcoholic beverages.  You may eat anything you prefer, but it is  better to start with liquids then soups and crackers, and gradually work up to solid foods.  Please notify your doctor immediately if you have any unusual bleeding, trouble breathing or pain that is not related to your normal pain.  Depending on the type of procedure that was done, some parts of your body may feel week and/or numb.  This usually clears up by tonight or the next day.  Walk with the use of an assistive device or accompanied by an adult for the 24 hours.  You may use ice on the affected area for the first 24 hours.  Put ice in a Ziploc bag and cover with a towel and place against area 15 minutes on 15 minutes off.  You may switch to heat after 24 hours.

## 2016-03-30 NOTE — Progress Notes (Signed)
Patient's Name: Manuel Knight  MRN: [id number]  Referring Provider: Milinda Pointer, MD  DOB: 1960/08/29  PCP: [facility]  DOS: 03/30/2016  Note by: Kathlen Brunswick. Dossie Arbour, MD  Service setting: Ambulatory outpatient  Location: [facility] (AMB) Pain Management Facility  Visit type: Procedure  Specialty: Interventional Pain Management  Patient type: Established   Primary Reason for Visit: Interventional Pain Management Treatment. CC: Back Pain (left)  Procedure:  Anesthesia, Analgesia, Anxiolysis:  Type: Therapeutic Medial Branch Facet & Sacroiliac joint Radiofrequency Ablation Region: Lumbosacral Level: L2, L3, L4, L5, S1, S2, & S3 Medial Branch Level(s) Laterality: Left-Sided  Type: Local Anesthesia with Moderate (Conscious) Sedation Local Anesthetic: Lidocaine 1% Route: Intravenous (IV) IV Access: Secured Sedation: Meaningful verbal contact was maintained at all times during the procedure  Indication(s): Analgesia and Anxiety  Indications: 1. Chronic low back pain (Location of Secondary source of pain) (Bilateral) (L>R)   2. Lumbar facet syndrome (Location of Secondary source of pain) (Bilateral) (L>R)   3. 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) (L>R)   4. Lumbar facet arthropathy   5. Spondylosis of lumbar spine   6. Acute postoperative pain    Manuel Knight has either failed to respond, was unable to tolerate, or simply did not get enough benefit from other more conservative therapies including, but not limited to: 1. Over-the-counter medications 2. Anti-inflammatory medications 3. Muscle relaxants 4. Membrane stabilizers 5. Opioids 6. Physical therapy 7. Modalities (Heat, ice, etc.) 8. Invasive techniques such as nerve blocks. Manuel Knight has attained more than 50% relief of the pain from a series of diagnostic injections conducted in separate occasions.  Pain Score: Pre-procedure: 6 /10 Post-procedure: 0-No  pain/10 (on treated side)  Pre-op Assessment:  Previous date of service: 03/30/16 Service provided: Med Refill Manuel Knight is a 56 y.o. (year old), male patient, seen today for interventional treatment. 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 His primarily concern today is the Back Pain (left)  Initial Vital Signs: Blood pressure (!) 157/87, pulse 81, temperature 98 F (36.7 C), resp. rate 16, height 5' 9.5" (1.765 m), weight 247 lb (112 kg), SpO2 (!) 81 %. BMI: 35.95 kg/m  Risk Assessment: Allergies: Reviewed. He is allergic to lodine [etodolac]; tylenol [acetaminophen]; iodine; and propoxyphene n-acetaminophen.  Allergy Precautions: None required Coagulopathies: "Reviewed. None identified.  Blood-thinner therapy: None at this time Active Infection(s): Reviewed. None identified. [name] is afebrile  Site Confirmation: Manuel Knight was asked to confirm the procedure and laterality before marking the site Procedure checklist: Completed Consent: Before the procedure and under the influence of no sedative(s), amnesic(s), or anxiolytics, the patient was informed of the treatment options, risks and possible complications. To fulfill our ethical and legal obligations, as recommended by the American Medical Association's Code of Ethics, I have informed the patient of my clinical impression; the nature and purpose of the treatment or procedure; the risks, benefits, and possible complications of the intervention; the alternatives, including doing nothing; the risk(s) and benefit(s) of the alternative treatment(s) or procedure(s); and the risk(s) and benefit(s) of doing nothing. The patient was provided information about the general risks and possible complications associated with the procedure. These may include, but are not limited to:  failure to achieve desired goals, infection, bleeding, organ or nerve damage, allergic reactions, paralysis, and death. In addition, the patient was informed of  those risks and complications associated to Spine-related procedures, such as failure to decrease pain; infection (i.e.: Meningitis, epidural or intraspinal abscess); bleeding (i.e.: epidural hematoma, subarachnoid hemorrhage, or any other type of intraspinal or peri-dural bleeding); organ or nerve damage (i.e.: Any type of peripheral nerve, nerve root, or spinal cord injury) with subsequent damage to sensory, motor, and/or autonomic systems, resulting in permanent pain, numbness, and/or weakness of one or several areas of the body; allergic reactions; (i.e.: anaphylactic reaction); and/or death. Furthermore, the patient was informed of those risks and complications associated with the medications. These include, but are not limited to: allergic reactions (i.e.: anaphylactic or anaphylactoid reaction(s)); adrenal axis suppression; blood sugar elevation that in diabetics may result in ketoacidosis or comma; water retention that in patients with history of congestive heart failure may result in shortness of breath, pulmonary edema, and decompensation with resultant heart failure; weight gain; swelling or edema; medication-induced neural toxicity; particulate matter embolism and blood vessel occlusion with resultant organ, and/or nervous system infarction; and/or aseptic necrosis of one or more joints. Finally, the patient was informed that Medicine is not an exact science; therefore, there is also the possibility of unforeseen or unpredictable risks and/or possible complications that may result in a catastrophic outcome. The patient indicated having understood very clearly. We have given the patient no guarantees and we have made no promises. Enough time was given to the patient to ask questions, all of which were answered to the patient's satisfaction. Mr.  Knight has indicated that he wanted to continue with the procedure. Attestation: I, the ordering provider, attest that I have discussed with the patient the benefits, risks, side-effects, alternatives, likelihood of achieving goals, and potential problems during recovery for the procedure that I have provided informed consent. Date: 03/30/2016; Time: 8:59 AM  Pre-Procedure Preparation:  Monitoring: As per clinic protocol. Respiration, ETCO2, SpO2, BP, heart rate and rhythm monitor placed and checked for adequate function Safety Precautions: Patient was assessed for positional comfort and pressure points before starting the procedure. Time-out: I initiated and conducted the "Time-out" before starting the procedure, as per protocol. The patient was asked to participate by confirming the accuracy of the "Time Out" information. Verification of the correct person, site, and procedure were performed and confirmed by me, the nursing staff, and the patient. "Time-out" conducted as per Joint Commission's Universal Protocol (UP.01.01.01). "Time-out" Date & Time: 03/30/2016; 1016 hrs.  Description of Procedure Process:   Target Area: For Lumbar Facet blocks, the target is the groove formed by the junction of the transverse process and superior articular process. For the L5 dorsal ramus, the target is the notch between superior articular process and sacral ala. For the Sacral dorsal rami, the target is the superior and lateral edge of the posterior S1, S2, & S3 Sacral foramen. Approach: Paraspinal approach. Area Prepped: Entire Posterior Lumbosacral Region Prepping solution: Hibiclens (4.0% Chlorhexidine gluconate solution) Safety Precautions: Aspiration looking for blood return was conducted prior to all injections. At no point did we inject any substances, as a needle was being advanced. No attempts were made at seeking any paresthesias. Safe injection practices and needle disposal techniques used. Medications  properly checked for expiration dates. SDV (single dose vial) medications used. Description of the Procedure: Protocol guidelines were followed. The patient was placed in position over the fluoroscopy table. The target area was identified and the area prepped in the usual manner. Skin desensitized using vapocoolant spray. Skin & deeper tissues infiltrated with local anesthetic. Appropriate amount of time  allowed to pass for local anesthetics to take effect. Radiofrequency needles were introduced to the area of the medial branch at the junction of the superior articular process and transverse process using fluoroscopy. Using the [employer], sensory stimulation using 50 Hz was used to locate & identify the nerve, making sure that the needle was positioned such that there was no sensory stimulation below 0.3 V or above 0.7 V. Stimulation using 2 Hz was used to evaluate the motor component. Care was taken not to lesion any nerves that demonstrated motor stimulation of the lower extremities at an output of less than 2.5 times that of the sensory threshold, or a maximum of 2.0 V. Once satisfactory placement of the needles was achieved, the above solution was slowly injected after negative aspiration. After waiting for at least 2 minutes, the ablation was performed at 80 degrees C for 60 seconds.The needles were then removed and the area cleansed, making sure to leave some of the prepping solution back to take advantage of its long term bactericidal properties. Vitals:   03/30/16 1107 03/30/16 1117 03/30/16 1127 03/30/16 1136  BP: (!) 160/81 130/80 (!) 149/83 139/85  Pulse:      Resp: 16 15 (!) 23 (!) 8  Temp:  98.2 F (36.8 C)    TempSrc:  Temporal    SpO2: 97% 96% 97% 95%  Weight:      Height:        Start Time: 1016 hrs. End Time: 1105 hrs. Materials & Medications:  Needle(s) Type: Teflon-coated, curved tip, Radiofrequency needle(s) Gauge: 20G Length: 15cm Medication(s): We  administered fentaNYL, lactated ringers, midazolam, triamcinolone acetonide, triamcinolone acetonide, and ropivacaine (PF) 5 mg/mL (0.5%). Please see chart orders for dosing details.  Imaging Guidance (Spinal):  Type of Imaging Technique: Fluoroscopy Guidance (Spinal) Indication(s): Assistance in needle guidance and placement for procedures requiring needle placement in or near specific anatomical locations not easily accessible without such assistance. Exposure Time: Please see nurses notes. Contrast: None used. Fluoroscopic Guidance: I was personally present during the use of fluoroscopy. "Tunnel Vision Technique" used to obtain the best possible view of the target area. Parallax error corrected before commencing the procedure. "Direction-depth-direction" technique used to introduce the needle under continuous pulsed fluoroscopy. Once target was reached, antero-posterior, oblique, and lateral fluoroscopic projection used confirm needle placement in all planes. Images permanently stored in EMR. Interpretation: No contrast injected. I personally interpreted the imaging intraoperatively. Adequate needle placement confirmed in multiple planes. Permanent images saved into the patient's record.  Antibiotic Prophylaxis:  Indication(s): None identified Antibiotic given: None  Post-operative Assessment:  EBL: None Complications: No heme, CSF or paresthesias the patient did experience some weakness on hip flexion, but no numbness in the lower extremity. This is thought to be secondary to the local anesthetics since he did not have any evidence of heme or paresthesias during needle placement. In addition, sensory and motor testing were positive for medial branch and dorsal rami, but no evidence of nerve root stimulation as documented by no more also twitching in the lower extremities. Muscle twitching limited to paravertebral muscles, as seen with dorsal rami stimulation. Note: The patient tolerated the  entire procedure well. A repeat set of vitals were taken after the procedure and the patient was kept under observation following institutional policy, for this type of procedure. Post-procedural neurological assessment was performed, showing return to baseline, prior to discharge. The patient was provided with post-procedure discharge instructions, including a section on how to identify potential problems.  Should any problems arise concerning this procedure, the patient was given instructions to immediately contact us, at any time, without hesitation. In any case, we plan to contact the patient by telephone for a follow-up status report regarding this interventional procedure. Comments:  No additional relevant information.  Plan of Care  Disposition: Discharge home  Discharge Date & Time: 03/30/2016; 1139 hrs.  Physician-requested Follow-up:  Return in about 2 weeks (around 04/13/2016) for opposite side RFA (2-wks from now).  Future Appointments Date Time Provider [place]  05/11/2016 1:00 PM Milinda Pointer, MD [facility] None  07/13/2016 8:45 AM Milinda Pointer, MD [facility] None   Medications ordered for procedure: Meds ordered this encounter  Medications  . fentaNYL (SUBLIMAZE) injection 25-50 mcg    Make sure Narcan is available in the pyxis when using this medication. In the event of respiratory depression (RR< 8/min): Titrate NARCAN (naloxone) in increments of 0.1 to 0.2 mg IV at 2-3 minute intervals, until desired degree of reversal.  . lactated ringers infusion 1,000 mL  . midazolam (VERSED) 5 MG/5ML injection 1-2 mg    Make sure Flumazenil is available in the pyxis when using this medication. If oversedation occurs, administer 0.2 mg IV over 15 sec. If after 45 sec no response, administer 0.2 mg again over 1 min; may repeat at 1 min intervals; not to exceed 4 doses (1 mg)  . triamcinolone acetonide (KENALOG-40) injection 40 mg  . lidocaine (PF) (XYLOCAINE) 1 % injection 10  mL  . triamcinolone acetonide (KENALOG-40) injection 40 mg  . lidocaine (PF) (XYLOCAINE) 1 % injection 10 mL  . ropivacaine (PF) 5 mg/mL (0.5%) (NAROPIN) injection 5 mL    Preservative-free (MPF), single use vial.  . ropivacaine (PF) 5 mg/mL (0.5%) (NAROPIN) injection 5 mL    Preservative-free (MPF), single use vial.  . HYDROmorphone (DILAUDID) 2 MG tablet    Sig: Take 1 tablet (2 mg total) by mouth every 6 (six) hours as needed for severe pain.    Dispense:  30 tablet    Refill:  0    For acute post-operative pain s/p lumbar facet radiofrequency. Do not fill until: 03/30/16 To last until: 04/09/16   Medications administered: We administered fentaNYL, lactated ringers, midazolam, triamcinolone acetonide, triamcinolone acetonide, and ropivacaine (PF) 5 mg/mL (0.5%).  See the medical record for exact dosing, route, and time of administration.  Lab-work, Procedure(s), & Referral(s) Ordered: Orders Placed This Encounter  Procedures  . Radiofrequency,Lumbar  . Radiofrequency,Lumbar  . Radiofrequency Sacroiliac Joint  . DG C-Arm 1-60 Min-No Report  . Glucose, capillary  . Discharge instructions  . Follow-up  . Informed Consent Details: Transcribe to consent form and obtain patient signature  . Provider attestation of informed consent for procedure/surgical case  . Verify informed consent   Imaging Ordered: Results for orders placed in visit on 12/23/15  DG C-Arm 1-60 Min-No Report   Narrative There is no Radiologist interpretation  for this exam.   New Prescriptions   HYDROMORPHONE (DILAUDID) 2 MG TABLET    Take 1 tablet (2 mg total) by mouth every 6 (six) hours as needed for severe pain.   Primary Care Physician: [facility] Location: [facility] Note by: Kathlen Brunswick. Dossie Arbour, M.D, DABA, DABAPM, DABPM, DABIPP, FIPP Date: 03/30/2016; Time: 4:08 PM  Disclaimer:  Medicine is not an Chief Strategy Officer. The only guarantee in  medicine is that nothing is guaranteed. It is important to note that the decision to proceed with this  intervention was based on the information collected from the patient. The Data and conclusions were drawn from the patient's questionnaire, the interview, and the physical examination. Because the information was provided in large part by the patient, it cannot be guaranteed that it has not been purposely or unconsciously manipulated. Every effort has been made to obtain as much relevant data as possible for this evaluation. It is important to note that the conclusions that lead to this procedure are derived in large part from the available data. Always take into account that the treatment will also be dependent on availability of resources and existing treatment guidelines, considered by other Pain Management Practitioners as being common knowledge and practice, at the time of the intervention. For Medico-Legal purposes, it is also important to point out that variation in procedural techniques and pharmacological choices are the acceptable norm. The indications, contraindications, technique, and results of the above procedure should only be interpreted and judged by a Board-Certified Interventional Pain Specialist with extensive familiarity and expertise in the same exact procedure and technique. Attempts at providing opinions without similar or greater experience and expertise than that of the treating physician will be considered as inappropriate and unethical, and shall result in a formal complaint to the state medical board and applicable specialty societies.  Instructions provided at this appointment: Patient Instructions  rx for hydromorphone (Dilaudid) 2 mg x 10 days given to patient.   Post-Procedure instructions Instructions:  Apply ice: Fill a plastic sandwich bag with crushed ice. Cover it with a small towel and apply to injection site. Apply for 15 minutes then remove x 15 minutes. Repeat  sequence on day of procedure, until you go to bed. The purpose is to minimize swelling and discomfort after procedure.  Apply heat: Apply heat to procedure site starting the day following the procedure. The purpose is to treat any soreness and discomfort from the procedure.  Food intake: Start with clear liquids (like water) and advance to regular food, as tolerated.   Physical activities: Keep activities to a minimum for the first 8 hours after the procedure.   Driving: If you have received any sedation, you are not allowed to drive for 24 hours after your procedure.  Blood thinner: Restart your blood thinner 6 hours after your procedure. (Only for those taking blood thinners)  Insulin: As soon as you can eat, you may resume your normal dosing schedule. (Only for those taking insulin)  Infection prevention: Keep procedure site clean and dry.  Post-procedure Pain Diary: Extremely important that this be done correctly and accurately. Recorded information will be used to determine the next step in treatment.  Pain evaluated is that of treated area only. Do not include pain from an untreated area.  Complete every hour, on the hour, for the initial 8 hours. Set an alarm to help you do this part accurately.  Do not go to sleep and have it completed later. It will not be accurate.  Follow-up appointment: Keep your follow-up appointment after the procedure. Usually 2 weeks for most procedures. (6 weeks in the case of radiofrequency.) Bring you pain diary.  Expect:  From numbing medicine (AKA: Local Anesthetics): Numbness or decrease in pain.  Onset: Full effect within 15 minutes of injected.  Duration: It will depend on the type of local anesthetic used. On the average, 1 to 8 hours.   From steroids: Decrease in swelling or inflammation. Once inflammation is improved, relief of the pain will follow.  Onset of benefits: Depends  on the amount of swelling present. The more swelling, the  longer it will take for the benefits to be seen.   Duration: Steroids will stay in the system x 2 weeks. Duration of benefits will depend on multiple posibilities including persistent irritating factors.  From procedure: Some discomfort is to be expected once the numbing medicine wears off. This should be minimal if ice and heat are applied as instructed. Call if:  You experience numbness and weakness that gets worse with time, as opposed to wearing off.  New onset bowel or bladder incontinence. (Spinal procedures only)  Emergency Numbers:  Durning business hours (Monday - Thursday, 8:00 AM - 4:00 PM) (Friday, 9:00 AM - 12:00 Noon): [phone]  After hours: [phone]   __________________________________________________________________________________________   Pain Management Discharge Instructions  General Discharge Instructions :  If you need to reach your doctor call: Monday-Friday 8:00 am - 4:00 pm at [phone] or toll free [phone].  After clinic hours [phone] to have operator reach doctor.  Bring all of your medication bottles to all your appointments in the pain clinic.  To cancel or reschedule your appointment with Pain Management please remember to call 24 hours in advance to avoid a fee.  Refer to the educational materials which you have been given on: General Risks, I had my Procedure. Discharge Instructions, Post Sedation.  Post Procedure Instructions:  The drugs you were given will stay in your system until tomorrow, so for the next 24 hours you should not drive, make any legal decisions or drink any alcoholic beverages.  You may eat anything you prefer, but it is better to start with liquids then soups and crackers, and gradually work up to solid foods.  Please notify your doctor immediately if you have any unusual bleeding, trouble breathing or pain that is not related to your normal pain.  Depending on the type of procedure that was  done, some parts of your body may feel week and/or numb.  This usually clears up by tonight or the next day.  Walk with the use of an assistive device or accompanied by an adult for the 24 hours.  You may use ice on the affected area for the first 24 hours.  Put ice in a Ziploc bag and cover with a towel and place against area 15 minutes on 15 minutes off.  You may switch to heat after 24 hours.

## 2016-03-30 NOTE — Progress Notes (Signed)
Safety precautions to be maintained throughout the outpatient stay will include: orient to surroundings, keep bed in low position, maintain call bell within reach at all times, provide assistance with transfer out of bed and ambulation.  

## 2016-03-31 ENCOUNTER — Telehealth: Payer: Self-pay | Admitting: *Deleted

## 2016-03-31 NOTE — Telephone Encounter (Signed)
Spoke with patient re; procedure on yesterday. States that his pain came back approx. 2030 last evening, when numbness wore off.  I did tell him that is a normal finding as it will take approx 10 days for steroid to do its job and will stay in his system for approx 2 weeks.  Patient verbalizes no other questions or concerns.

## 2016-04-08 ENCOUNTER — Other Ambulatory Visit: Payer: Self-pay | Admitting: Pain Medicine

## 2016-05-11 ENCOUNTER — Ambulatory Visit: Payer: Medicare Other | Attending: Pain Medicine | Admitting: Pain Medicine

## 2016-05-11 ENCOUNTER — Encounter: Payer: Self-pay | Admitting: Pain Medicine

## 2016-05-11 VITALS — BP 125/64 | HR 79 | Temp 97.8°F | Resp 18 | Ht 69.5 in | Wt 242.0 lb

## 2016-05-11 DIAGNOSIS — R1013 Epigastric pain: Secondary | ICD-10-CM | POA: Diagnosis not present

## 2016-05-11 DIAGNOSIS — Z79891 Long term (current) use of opiate analgesic: Secondary | ICD-10-CM | POA: Diagnosis not present

## 2016-05-11 DIAGNOSIS — M25562 Pain in left knee: Secondary | ICD-10-CM | POA: Diagnosis not present

## 2016-05-11 DIAGNOSIS — F419 Anxiety disorder, unspecified: Secondary | ICD-10-CM | POA: Insufficient documentation

## 2016-05-11 DIAGNOSIS — I2510 Atherosclerotic heart disease of native coronary artery without angina pectoris: Secondary | ICD-10-CM | POA: Insufficient documentation

## 2016-05-11 DIAGNOSIS — M533 Sacrococcygeal disorders, not elsewhere classified: Secondary | ICD-10-CM | POA: Diagnosis not present

## 2016-05-11 DIAGNOSIS — I252 Old myocardial infarction: Secondary | ICD-10-CM | POA: Insufficient documentation

## 2016-05-11 DIAGNOSIS — I5022 Chronic systolic (congestive) heart failure: Secondary | ICD-10-CM | POA: Insufficient documentation

## 2016-05-11 DIAGNOSIS — M545 Low back pain, unspecified: Secondary | ICD-10-CM

## 2016-05-11 DIAGNOSIS — J449 Chronic obstructive pulmonary disease, unspecified: Secondary | ICD-10-CM | POA: Insufficient documentation

## 2016-05-11 DIAGNOSIS — E785 Hyperlipidemia, unspecified: Secondary | ICD-10-CM | POA: Insufficient documentation

## 2016-05-11 DIAGNOSIS — I110 Hypertensive heart disease with heart failure: Secondary | ICD-10-CM | POA: Diagnosis not present

## 2016-05-11 DIAGNOSIS — Z6835 Body mass index (BMI) 35.0-35.9, adult: Secondary | ICD-10-CM | POA: Diagnosis not present

## 2016-05-11 DIAGNOSIS — Z955 Presence of coronary angioplasty implant and graft: Secondary | ICD-10-CM | POA: Insufficient documentation

## 2016-05-11 DIAGNOSIS — G8929 Other chronic pain: Secondary | ICD-10-CM

## 2016-05-11 DIAGNOSIS — E669 Obesity, unspecified: Secondary | ICD-10-CM | POA: Diagnosis not present

## 2016-05-11 DIAGNOSIS — G894 Chronic pain syndrome: Secondary | ICD-10-CM | POA: Insufficient documentation

## 2016-05-11 DIAGNOSIS — M4696 Unspecified inflammatory spondylopathy, lumbar region: Secondary | ICD-10-CM | POA: Diagnosis not present

## 2016-05-11 DIAGNOSIS — K219 Gastro-esophageal reflux disease without esophagitis: Secondary | ICD-10-CM | POA: Diagnosis not present

## 2016-05-11 DIAGNOSIS — F1190 Opioid use, unspecified, uncomplicated: Secondary | ICD-10-CM

## 2016-05-11 DIAGNOSIS — M47896 Other spondylosis, lumbar region: Secondary | ICD-10-CM | POA: Insufficient documentation

## 2016-05-11 DIAGNOSIS — Z87891 Personal history of nicotine dependence: Secondary | ICD-10-CM | POA: Insufficient documentation

## 2016-05-11 DIAGNOSIS — Z794 Long term (current) use of insulin: Secondary | ICD-10-CM | POA: Insufficient documentation

## 2016-05-11 DIAGNOSIS — M47816 Spondylosis without myelopathy or radiculopathy, lumbar region: Secondary | ICD-10-CM

## 2016-05-11 DIAGNOSIS — Z79899 Other long term (current) drug therapy: Secondary | ICD-10-CM | POA: Diagnosis not present

## 2016-05-11 DIAGNOSIS — R6510 Systemic inflammatory response syndrome (SIRS) of non-infectious origin without acute organ dysfunction: Secondary | ICD-10-CM | POA: Diagnosis not present

## 2016-05-11 DIAGNOSIS — E1165 Type 2 diabetes mellitus with hyperglycemia: Secondary | ICD-10-CM | POA: Diagnosis not present

## 2016-05-11 NOTE — Progress Notes (Signed)
Safety precautions to be maintained throughout the outpatient stay will include: orient to surroundings, keep bed in low position, maintain call bell within reach at all times, provide assistance with transfer out of bed and ambulation.  

## 2016-05-11 NOTE — Patient Instructions (Addendum)
Do not eat or drink for 8 hours prior to procedure.  Do not take insulin until after the procedure and you are able to eat.  Take blood pressure medication the morning of the procedure.     Preparing for Procedure with Sedation Instructions: . Oral Intake: Do not eat or drink anything for at least 8 hours prior to your procedure. . Transportation: Public transportation is not allowed. Bring an adult driver. The driver must be physically present in our waiting room before any procedure can be started. [facility] Kitchen Physical Assistance: Bring an adult physically capable of assisting you, in the event you need help. This adult should keep you company at home for at least 6 hours after the procedure. . Blood Pressure Medicine: Take your blood pressure medicine with a sip of water the morning of the procedure. . Blood thinners:  . Diabetics on insulin: Notify the staff so that you can be scheduled 1st case in the morning. If your diabetes requires high dose insulin, take only  of your normal insulin dose the morning of the procedure and notify the staff that you have done so. . Preventing infections: Shower with an antibacterial soap the morning of your procedure. . Build-up your immune system: Take 1000 mg of Vitamin C with every meal (3 times a day) the day prior to your procedure. Marland Kitchen Antibiotics: Inform the staff if you have a condition or reason that requires you to take antibiotics before dental procedures. . Pregnancy: If you are pregnant, call and cancel the procedure. . Sickness: If you have a cold, fever, or any active infections, call and cancel the procedure. . Arrival: You must be in the facility at least 30 minutes prior to your scheduled procedure. . Children: Do not bring children with you. . Dress appropriately: Bring dark clothing that you would not mind if they get stained. . Valuables: Do not bring any jewelry or valuables. Procedure appointments are reserved for interventional treatments  only. Marland Kitchen No Prescription Refills. . No medication changes will be discussed during procedure appointments. . No disability issues will be discussed. ______________________________________________________________________________________________  Preparing for Procedure with Sedation Instructions: . Oral Intake: Do not eat or drink anything for at least 8 hours prior to your procedure. . Transportation: Public transportation is not allowed. Bring an adult driver. The driver must be physically present in our waiting room before any procedure can be started. Marland Kitchen Physical Assistance: Bring an adult capable of physically assisting you, in the event you need help. . Blood Pressure Medicine: Take your blood pressure medicine with a sip of water the morning of the procedure. . Insulin: Take only  of your normal insulin dose. . Preventing infections: Shower with an antibacterial soap the morning of your procedure. . Build-up your immune system: Take 1000 mg of Vitamin C with every meal (3 times a day) the day prior to your procedure. . Pregnancy: If you are pregnant, call and cancel the procedure. . Sickness: If you have a cold, fever, or any active infections, call and cancel the procedure. . Arrival: You must be in the facility at least 30 minutes prior to your scheduled procedure. . Children: Do not bring children with you. . Dress appropriately: Bring dark clothing that you would not mind if they get stained. . Valuables: Do not bring any jewelry or valuables. Procedure appointments are reserved for interventional treatments only. Marland Kitchen No Prescription Refills. . No medication changes will be discussed during procedure appointments. No disability issues will be discussed.Radiofrequency  Lesioning Radiofrequency lesioning is a procedure that is performed to relieve pain. The procedure is often used for back, neck, or arm pain. Radiofrequency lesioning involves the use of a machine that creates radio waves  to make heat. During the procedure, the heat is applied to the nerve that carries the pain signal. The heat damages the nerve and interferes with the pain signal. Pain relief usually starts about 2 weeks after the procedure and lasts for 6 months to 1 year. Tell a health care provider about: Any allergies you have. All medicines you are taking, including vitamins, herbs, eye drops, creams, and over-the-counter medicines. Any problems you or family members have had with anesthetic medicines. Any blood disorders you have. Any surgeries you have had. Any medical conditions you have. Whether you are pregnant or may be pregnant. What are the risks? Generally, this is a safe procedure. However, problems may occur, including: Pain or soreness at the injection site. Infection at the injection site. Damage to nerves or blood vessels. What happens before the procedure? Ask your health care provider about: Changing or stopping your regular medicines. This is especially important if you are taking diabetes medicines or blood thinners. Taking medicines such as aspirin and ibuprofen. These medicines can thin your blood. Do not take these medicines before your procedure if your health care provider instructs you not to. Follow instructions from your health care provider about eating or drinking restrictions. Plan to have someone take you home after the procedure. If you go home right after the procedure, plan to have someone with you for 24 hours. What happens during the procedure? You will be given one or more of the following: A medicine to help you relax (sedative). A medicine to numb the area (local anesthetic). You will be awake during the procedure. You will need to be able to talk with the health care provider during the procedure. With the help of a type of X-ray (fluoroscopy), the health care provider will insert a radiofrequency needle into the area to be treated. Next, a wire that carries the  radio waves (electrode) will be put through the radiofrequency needle. An electrical pulse will be sent through the electrode to verify the correct nerve. You will feel a tingling sensation, and you may have muscle twitching. Then, the tissue that is around the needle tip will be heated by an electric current that is passed using the radiofrequency machine. This will numb the nerves. A bandage (dressing) will be put on the insertion area after the procedure is done. The procedure may vary among health care providers and hospitals. What happens after the procedure? Your blood pressure, heart rate, breathing rate, and blood oxygen level will be monitored often until the medicines you were given have worn off. Return to your normal activities as directed by your health care provider. This information is not intended to replace advice given to you by your health care provider. Make sure you discuss any questions you have with your health care provider. Document Released: 08/27/2010 Document Revised: 06/06/2015 Document Reviewed: 02/05/2014 [employer] Interactive Patient Education  2017 [employer]. .

## 2016-05-11 NOTE — Progress Notes (Signed)
Patient's Name: Manuel Knight  MRN: [id number]  Referring Provider: [facility]  DOB: 16-Jan-1960  PCP: [facility]  DOS: 05/11/2016  Note by: Kathlen Brunswick. Dossie Arbour, MD  Service setting: Ambulatory outpatient  Specialty: Interventional Pain Management  Location: [facility] (AMB) Pain Management Facility    Patient type: Established   Primary Reason(s) for Visit: Encounter for post-procedure evaluation of chronic illness with mild to moderate exacerbation CC: Back Pain (lower) and Knee Pain (left)  HPI  Manuel Knight is a 56 y.o. year old, male patient, who comes today for a post-procedure evaluation. He has Type 2 diabetes mellitus with hyperglycemia, with long-term current use of insulin ([place]); HLD (hyperlipidemia); Obesity, unspecified; Anxiety state; Essential hypertension; COPD; PULMONARY NODULE; HERNIA; GERD (gastroesophageal reflux disease); Esophageal dysphagia; NSTEMI (non-ST elevated myocardial infarction) ([place]); CAD (coronary artery disease), native coronary artery; S/P CABG x 3; Chronic systolic heart failure ([place]); Emphysema of lung ([place]); Lumbar spondylosis with lumbar radiculitis (Left); H/O coronary artery bypass surgery; Encounter for pain management planning; Encounter for therapeutic drug level monitoring; Opiate use (90 MME/Day); Chronic low back pain (Location of Secondary source of pain) (Bilateral) (L>R); Chronic knee pain (Location of Primary Source of Pain) (Bilateral) (L>R); Chronic foot pain (Bilateral) (L>R); Disturbance of skin sensation; Diabetic peripheral neuropathy (HCC) (lower extremity); Chronic lumbar radicular pain (L5 dermatome) (Location of Tertiary source of pain) (Left); 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) (L>R); Osteoarthritis of knee (Bilateral) (L>R); Lumbar facet syndrome (Location of Secondary source of pain) (Bilateral) (L>R); Lumbar facet arthropathy ([place]); Chronic lower extremity pain  (Location of Tertiary source of pain) (Bilateral) (L>R); History of marijuana use; Hypomagnesemia; Vitamin D insufficiency; Long term current use of opiate analgesic; Long term prescription opiate use; Chronic pain syndrome; SIRS (systemic inflammatory response syndrome) ([place]); Spondylosis of lumbar spine; and Acute postoperative pain on his problem list. His primarily concern today is the Back Pain (lower) and Knee Pain (left)  Pain Assessment: Self-Reported Pain Score: 5 /10 Clinically the patient looks like a 3/10 Reported level is inconsistent with clinical observations. Information on the proper use of the pain scale provided to the patient today Pain Type: Chronic pain Pain Location: Back Pain Orientation: Lower, Left Pain Descriptors / Indicators: Discomfort, Aching, Radiating, Constant Pain Frequency: Constant  Manuel Knight comes in today for post-procedure evaluation after the treatment done on 04/08/2016.  Further details on both, my assessment(s), as well as the proposed treatment plan, please see below.  Post-Procedure Assessment  04/08/2016 Procedure: Left sided lumbar facet + sacroiliac joint radiofrequency ablation under fluoroscopic guidance and IV sedation (33 days ago) Pre-procedure pain score:  6/10 Post-procedure pain score: 0/10 (100% relief) Influential Factors: BMI: 35.22 kg/m Intra-procedural challenges: None observed Assessment challenges: The patient is currently on day 59 post-radiofrequency and has not completed the 6 week recovery period         Post-procedural side-effects, adverse reactions, or complications: None reported Reported issues: None  Sedation: Sedation provided. When no sedatives are used, the analgesic levels obtained are directly associated to the effectiveness of the local anesthetics. However, when sedation is provided, the level of analgesia obtained during the initial 1 hour following the intervention, is believed to be the result of a  combination of factors. These factors may include, but are not limited to: 1. The effectiveness of the local anesthetics used. 2. The effects of the analgesic(s) and/or anxiolytic(s) used. 3. The degree of discomfort experienced by the patient at  the time of the procedure. 4. The patients ability and reliability in recalling and recording the events. 5. The presence and influence of possible secondary gains and/or psychosocial factors. Reported result: Relief experienced during the 1st hour after the procedure: 100 % (Ultra-Short Term Relief) Interpretative annotation: Analgesia during this period is likely to be Local Anesthetic and/or IV Sedative (Analgesic/Anxiolitic) related.          Effects of local anesthetic: The analgesic effects attained during this period are directly associated to the localized infiltration of local anesthetics and therefore cary significant diagnostic value as to the etiological location, or anatomical origin, of the pain. Expected duration of relief is directly dependent on the pharmacodynamics of the local anesthetic used. Long-acting (4-6 hours) anesthetics used.  Reported result: Relief during the next 4 to 6 hour after the procedure: 100 % (Short-Term Relief) Interpretative annotation: Complete relief would suggest area to be the source of the pain.          Long-term benefit: Defined as the period of time past the expected duration of local anesthetics. With the possible exception of prolonged sympathetic blockade from the local anesthetics, benefits during this period are typically attributed to, or associated with, other factors such as analgesic sensory neuropraxia, antiinflammatory effects, or beneficial biochemical changes provided by agents other than the local anesthetics Reported result: Extended relief following procedure: 0 % (Long-Term Relief) Interpretative annotation: Still having post procedure discomfort from the radiofrequency. He is still on week  #4.          Current benefits: Defined as persistent relief that continues at this point in time.   Reported results: Treated area: 0 %       Interpretative annotation: Patient is on postprocedure week #4, status post RFA  Interpretation: Results would suggest the patient is still having postop pain.          Laboratory Chemistry  Inflammation Markers Lab Results  Component Value Date   CRP 0.8 06/13/2015   ESRSEDRATE 2 06/13/2015   (CRP: Acute Phase) (ESR: Chronic Phase) Renal Function Markers Lab Results  Component Value Date   BUN 15 06/13/2015   CREATININE 0.98 06/13/2015   GFRAA >60 06/13/2015   GFRNONAA >60 06/13/2015   Hepatic Function Markers Lab Results  Component Value Date   AST 17 06/13/2015   ALT 19 06/13/2015   ALBUMIN 4.0 06/13/2015   ALKPHOS 58 06/13/2015   HCVAB NEGATIVE 11/22/2008   Electrolytes Lab Results  Component Value Date   NA 138 06/13/2015   K 4.6 06/13/2015   CL 107 06/13/2015   CALCIUM 9.4 06/13/2015   MG 1.4 (L) 06/13/2015   Neuropathy Markers Lab Results  Component Value Date   VITAMINB12 1,002 (H) 06/13/2015   Bone Pathology Markers Lab Results  Component Value Date   ALKPHOS 58 06/13/2015   VD25OH 31 02/03/2010   25OHVITD1 25 (L) 06/13/2015   25OHVITD2 <1.0 06/13/2015   25OHVITD3 25 06/13/2015   CALCIUM 9.4 06/13/2015   Coagulation Parameters Lab Results  Component Value Date   INR 1.59 (H) 02/06/2013   LABPROT 18.5 (H) 02/06/2013   APTT 40 (H) 02/06/2013   PLT 307 03/15/2015   Cardiovascular Markers Lab Results  Component Value Date   HGB 14.5 03/15/2015   HCT 45.5 03/15/2015   Note: Lab results reviewed.  Recent Diagnostic Imaging Review  Dg C-arm 1-60 Min-no Report  Result Date: 03/30/2016 Fluoroscopy was utilized by the requesting physician.  No radiographic interpretation.   Note: Imaging  results reviewed.          Meds  The patient has a current medication list which includes the following  prescription(s): albuterol, albuterol, alprazolam, amlodipine, amphetamine-dextroamphetamine, amphetamine-dextroamphetamine, aspirin ec, atorvastatin, benazepril, canagliflozin, carvedilol, carvedilol, citalopram, clonidine, d3 super strength, duloxetine, fluticasone, fluticasone-salmeterol, gabapentin, gabapentin, insulin aspart, insulin pen needle, insulin syringe-needle u-100, insulin syringe-needle u-100, levemir flextouch, magnesium oxide, meloxicam, metformin, naloxone, naloxone, nitroglycerin, oxycodone, oxycodone, pantoprazole, sacubitril-valsartan, sildenafil, sure comfort lancets 30g, tiotropium, truetrack test, hydromorphone, and oxycodone.  Current Outpatient Prescriptions on File Prior to Visit  Medication Sig  . albuterol (PROAIR HFA) 108 (90 Base) MCG/ACT inhaler Inhale 1 puff into the lungs daily.   Marland Kitchen albuterol (PROVENTIL) (2.5 MG/3ML) 0.083% nebulizer solution Inhale 2.5 mg into the lungs 2 (two) times daily as needed.   Marland Kitchen alprazolam (XANAX) 2 MG tablet Take 1 tablet by mouth 3 (three) times daily.   Marland Kitchen amLODipine (NORVASC) 10 MG tablet Take 10 mg by mouth daily.  Marland Kitchen amphetamine-dextroamphetamine (ADDERALL) 20 MG tablet Take 20 mg by mouth 3 (three) times daily.   Marland Kitchen aspirin EC 81 MG tablet Take 1 tablet (81 mg total) by mouth daily.  Marland Kitchen atorvastatin (LIPITOR) 80 MG tablet Take 1 tablet by mouth daily.  . benazepril (LOTENSIN) 20 MG tablet Take 20 mg by mouth daily.   . Canagliflozin (INVOKANA) 100 MG TABS Take 100 mg by mouth daily.  . carvedilol (COREG) 25 MG tablet Take 25 mg by mouth 2 (two) times daily with a meal.   . citalopram (CELEXA) 20 MG tablet Take 1 tablet by mouth daily.  . cloNIDine (CATAPRES) 0.3 MG tablet Take 0.3 mg by mouth 2 (two) times daily.  . D3 SUPER STRENGTH 2000 units CAPS Take 1 capsule by mouth daily.  . DULoxetine (CYMBALTA) 30 MG capsule 1 tab daily for one day, then 2 tabs daily  . Fluticasone-Salmeterol (ADVAIR DISKUS) 500-50 MCG/DOSE AEPB Inhale 1  puff into the lungs every 12 (twelve) hours.    . gabapentin (NEURONTIN) 300 MG capsule TAKE ONE CAPSULE BY MOUTH THREE TIMES DAILY. (TAKE WITH 600MG TABLET TO TOTAL 900MG)  . gabapentin (NEURONTIN) 600 MG tablet Take 600 mg by mouth 3 (three) times daily.  . insulin aspart (NOVOLOG FLEXPEN) 100 UNIT/ML FlexPen Inject 0-26 Units into the skin 3 (three) times daily with meals. 20 units three times a day with sliding scale max of 26 units  . Insulin Pen Needle (LITETOUCH PEN NEEDLES) 29G X 12.7MM MISC Use as directed.  . Insulin Syringe-Needle U-100 (B-D INS SYR ULTRAFINE 1CC/31G) 31G X 5/16" 1 ML MISC   . Insulin Syringe-Needle U-100 (B-D INS SYRINGE 0.5CC/31GX5/16) 31G X 5/16" 0.5 ML MISC   . LEVEMIR FLEXTOUCH 100 UNIT/ML Pen INJECT 20 UNITS EVERY MORNING.  . Magnesium Oxide 500 MG CAPS Take 1 capsule (500 mg total) by mouth 2 (two) times daily at 8 am and 10 pm.  . meloxicam (MOBIC) 15 MG tablet Take 15 mg by mouth 2 (two) times daily.  . metFORMIN (GLUCOPHAGE) 1000 MG tablet Take 1,000 mg by mouth 2 (two) times daily.   . naloxone (NARCAN) 2 MG/2ML injection Inject content of syringe into thigh muscle. Call 911.  . naloxone HCl 4 MG/0.1ML LIQD One spray in either nostril once for known/suspected opioid overdose. May repeat every 2-3 minutes in alternating nostril til EMS arrives  . nitroGLYCERIN (NITROSTAT) 0.4 MG SL tablet Place 1 tablet (0.4 mg total) under the tongue every 5 (five) minutes as needed for chest pain.  Marland Kitchen  oxyCODONE (ROXICODONE) 15 MG immediate release tablet Take 1 tablet (15 mg total) by mouth every 6 (six) hours as needed for pain.  Derrill Memo ON 06/08/2016] oxyCODONE (ROXICODONE) 15 MG immediate release tablet Take 1 tablet (15 mg total) by mouth every 6 (six) hours as needed for pain.  . pantoprazole (PROTONIX) 40 MG tablet 1 tablet Two (2) times a day.  . sacubitril-valsartan (ENTRESTO) 97-103 MG Take 1 tablet by mouth 2 (two) times daily.  . sildenafil (REVATIO) 20 MG tablet  Take 20 mg by mouth See admin instructions.  . SURE COMFORT LANCETS 30G MISC USE TO CHECK SUGAR THREE TIMES DAILY {AS DIRECTED]  . tiotropium (SPIRIVA) 18 MCG inhalation capsule Place 18 mcg into inhaler and inhale daily.    Angelia Mould TEST test strip USE TO CHECK BLOOD SUGAR THREE TIMES DAILY  . HYDROmorphone (DILAUDID) 2 MG tablet Take 1 tablet (2 mg total) by mouth every 6 (six) hours as needed for severe pain.  Marland Kitchen oxyCODONE (ROXICODONE) 15 MG immediate release tablet Take 1 tablet (15 mg total) by mouth every 6 (six) hours as needed.   No current facility-administered medications on file prior to visit.    ROS  Constitutional: Denies any fever or chills Gastrointestinal: No reported hemesis, hematochezia, vomiting, or acute GI distress Musculoskeletal: Denies any acute onset joint swelling, redness, loss of ROM, or weakness Neurological: No reported episodes of acute onset apraxia, aphasia, dysarthria, agnosia, amnesia, paralysis, loss of coordination, or loss of consciousness  Allergies  Manuel Knight is allergic to lodine [etodolac]; tylenol [acetaminophen]; iodine; and propoxyphene n-acetaminophen.  Manuel Knight  Drug: Manuel Knight  reports that he does not use drugs. Alcohol:  reports that he does not drink alcohol. Tobacco:  reports that he quit smoking about 27 years ago. His smoking use included Cigarettes. He started smoking about 49 years ago. He has a 28.50 pack-year smoking history. He has never used smokeless tobacco. Medical:  has a past medical history of Anxiety; Cellulitis (2011); Chronic back pain; Claustrophobia; COPD (chronic obstructive pulmonary disease) ([place]); Diabetes mellitus, type 2 ([place]); Diverticulitis of colon (2008); GERD (gastroesophageal reflux disease); Heart attack ([place]); Hyperlipidemia; Hypertension; Obesity; and Pulmonary nodule. Family: family history includes Cancer in his mother; Coronary artery disease in his mother and sister; Diabetes in his sister; Heart  attack in his brother and father.  Past Surgical History:  Procedure Laterality Date  . APPENDECTOMY  2007   gangrenous appendicitis   . CORONARY ARTERY BYPASS GRAFT N/A 02/06/2013   Procedure: CORONARY ARTERY BYPASS GRAFTING (CABG) with TEE;  Surgeon: [name], MD;  Location: [place] [facility];  Service: Open Heart Surgery;  Laterality: N/A;  CABG x three,  using left internal mammary artery and right leg greater saphenous vein harvested endoscopically  . LEFT HEART CATHETERIZATION WITH CORONARY ANGIOGRAM N/A 02/03/2013   Procedure: LEFT HEART CATHETERIZATION WITH CORONARY ANGIOGRAM;  Surgeon: Jettie Booze, MD;  Location[facility] C[facility] LAB;  Service: Cardiovascular;  Laterality: N/A;  . TONSILLECTOMY     Constitutional Exam  General appearance: Well nourished, well developed, and well hydrated. In no apparent acute distress Vitals:   05/11/16 1245  BP: 125/64  Pulse: 79  Resp: 18  Temp: 97.8 F (36.6 C)  TempSrc: Oral  SpO2: 96%  Weight: 242 lb (109.8 kg)  Height: 5' 9.5" (1.765 m)   BMI Assessment: Estimated body mass index is 35.22 kg/m as calculated from the following:   Height as of this encounter: 5' 9.5" (1.765 m).  Weight as of this encounter: 242 lb (109.8 kg).  BMI interpretation table: BMI level Category Range association with higher incidence of chronic pain  <18 kg/m2 Underweight   18.5-24.9 kg/m2 Ideal body weight   25-29.9 kg/m2 Overweight Increased incidence by 20%  30-34.9 kg/m2 Obese (Class I) Increased incidence by 68%  35-39.9 kg/m2 Severe obesity (Class II) Increased incidence by 136%  >40 kg/m2 Extreme obesity (Class III) Increased incidence by 254%   BMI Readings from Last 4 Encounters:  05/11/16 35.22 kg/m  03/30/16 35.95 kg/m  03/26/16 35.74 kg/m  01/28/16 35.74 kg/m   Wt Readings from Last 4 Encounters:  05/11/16 242 lb (109.8 kg)  03/30/16 247 lb (112 kg)  03/26/16 242 lb (109.8 kg)  01/28/16 242 lb (109.8 kg)  Psych/Mental status: Alert,  oriented x 3 (person, place, & time)       Eyes: PERLA Respiratory: No evidence of acute respiratory distress  Cervical Spine Exam  Inspection: No masses, redness, or swelling Alignment: Symmetrical Functional ROM: Unrestricted ROM      Stability: No instability detected Muscle strength & Tone: Functionally intact Sensory: Unimpaired Palpation: No palpable anomalies              Upper Extremity (UE) Exam    Side: Right upper extremity  Side: Left upper extremity  Inspection: No masses, redness, swelling, or asymmetry. No contractures  Inspection: No masses, redness, swelling, or asymmetry. No contractures  Functional ROM: Unrestricted ROM          Functional ROM: Unrestricted ROM          Muscle strength & Tone: Functionally intact  Muscle strength & Tone: Functionally intact  Sensory: Unimpaired  Sensory: Unimpaired  Palpation: No palpable anomalies              Palpation: No palpable anomalies              Specialized Test(s): Deferred         Specialized Test(s): Deferred          Thoracic Spine Exam  Inspection: No masses, redness, or swelling Alignment: Symmetrical Functional ROM: Unrestricted ROM Stability: No instability detected Sensory: Unimpaired Muscle strength & Tone: No palpable anomalies  Lumbar Spine Exam  Inspection: No masses, redness, or swelling Alignment: Symmetrical Functional ROM: Decreased ROM      Stability: No instability detected Muscle strength & Tone: Functionally intact Sensory: Movement-associated pain Palpation: Tender       Provocative Tests: Lumbar Hyperextension and rotation test: Positive bilaterally for facet joint pain. Patrick's Maneuver: Positive for bilateral S-I arthralgia              Gait & Posture Assessment  Ambulation: Unassisted Gait: Relatively normal for age and body habitus Posture: Antalgic   Lower Extremity Exam    Side: Right lower extremity  Side: Left lower extremity  Inspection: No masses, redness, swelling, or  asymmetry. No contractures  Inspection: No masses, redness, swelling, or asymmetry. No contractures  Functional ROM: Unrestricted ROM          Functional ROM: Unrestricted ROM          Muscle strength & Tone: Functionally intact  Muscle strength & Tone: Functionally intact  Sensory: Unimpaired  Sensory: Unimpaired  Palpation: No palpable anomalies  Palpation: No palpable anomalies   Assessment  Primary Diagnosis & Pertinent Problem List: The primary encounter diagnosis was Chronic low back pain (Location of Secondary source of pain) (Bilateral) (L>R). Diagnoses of Chronic sacroiliac joint pain (Location  of Secondary source of pain) (Bilateral) (L>R), Lumbar facet syndrome (Location of Secondary source of pain) (Bilateral) (L>R), and Opiate use (90 MME/Day) were also pertinent to this visit.  Status Diagnosis  Unimproved Unimproved Unimproved 1. Chronic low back pain (Location of Secondary source of pain) (Bilateral) (L>R)   2. 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) (L>R)   3. Lumbar facet syndrome (Location of Secondary source of pain) (Bilateral) (L>R)   4. Opiate use (90 MME/Day)      Plan of Care  Pharmacotherapy (Medications Ordered): No orders of the defined types were placed in this encounter.  New Prescriptions   No medications on file   Medications administered today: Mr. Harvie had no medications administered during this visit. Lab-work, procedure(s), and/or referral(s): Orders Placed This Encounter  Procedures  . Radiofrequency,Lumbar  . Radiofrequency Sacroiliac Joint   Imaging and/or referral(s): None  Interventional therapies: Planned, scheduled, and/or pending:   Right-sided lumbar facet + sacroiliac joint RFA    Considering:   Diagnostic bilateralintra-articular knee injection Diagnostic bilateral genicular nerve block Possible bilateral genicular nerve radiofrequency ablation Diagnostic bilateral lumbar facet  block Possible bilaterallumbar facet radiofrequency ablation (Left side done on 03/30/2016) Diagnostic left-sided L4-5 lumbar epidural steroid injection Diagnostic bilateral sacroiliac joint block Possible bilateral sacroiliac joint radiofrequency ablation   Palliative PRN treatment(s):   Palliative bilateral intra-articular knee injection Palliative bilateral genicular nerve block Palliative bilateral lumbar facet block Palliative left-sided L4-5 lumbar epidural steroid injection Palliative bilateral sacroiliac joint block   Provider-requested follow-up: Return for Procedure: Right L-Fct + SI RFA, (ASAP), in addition, Med-Mgmt, by MD.  Future Appointments Date Time Provider [place]  06/04/2016 9:30 AM Milinda Pointer, MD [facility] None  06/15/2016 10:45 AM Milinda Pointer, MD [facility] None   Primary Care Physician: [facility] Location: [facility] Outpatient [facility] Management Facility Note by: Kathlen Brunswick. Dossie Arbour, M.D, DABA, DABAPM, DABPM, DABIPP, FIPP Date: 05/11/2016; Time: 4:17 PM  Patient instructions provided during this appointment: Patient Instructions  Do not eat or drink for 8 hours prior to procedure.  Do not take insulin until after the procedure and you are able to eat.  Take blood pressure medication the morning of the procedure.     Preparing for Procedure with Sedation Instructions: . Oral Intake: Do not eat or drink anything for at least 8 hours prior to your procedure. . Transportation: Public transportation is not allowed. Bring an adult driver. The driver must be physically present in our waiting room before any procedure can be started. Marland Kitchen Physical Assistance: Bring an adult physically capable of assisting you, in the event you need help. This adult should keep you company at home for at least 6 hours after the procedure. . Blood Pressure Medicine: Take your blood pressure medicine with a sip of water the morning of the  procedure. . Blood thinners:  . Diabetics on insulin: Notify the staff so that you can be scheduled 1st case in the morning. If your diabetes requires high dose insulin, take only  of your normal insulin dose the morning of the procedure and notify the staff that you have done so. . Preventing infections: Shower with an antibacterial soap the morning of your procedure. . Build-up your immune system: Take 1000 mg of Vitamin C with every meal (3 times a day) the day prior to your procedure. Marland Kitchen Antibiotics: Inform the staff if you have a condition or reason that requires you to take antibiotics before dental procedures. Marland Kitchen  Pregnancy: If you are pregnant, call and cancel the procedure. . Sickness: If you have a cold, fever, or any active infections, call and cancel the procedure. . Arrival: You must be in the facility at least 30 minutes prior to your scheduled procedure. . Children: Do not bring children with you. . Dress appropriately: Bring dark clothing that you would not mind if they get stained. . Valuables: Do not bring any jewelry or valuables. Procedure appointments are reserved for interventional treatments only. Marland Kitchen No Prescription Refills. . No medication changes will be discussed during procedure appointments. . No disability issues will be discussed. ______________________________________________________________________________________________  Preparing for Procedure with Sedation Instructions: . Oral Intake: Do not eat or drink anything for at least 8 hours prior to your procedure. . Transportation: Public transportation is not allowed. Bring an adult driver. The driver must be physically present in our waiting room before any procedure can be started. Marland Kitchen Physical Assistance: Bring an adult capable of physically assisting you, in the event you need help. . Blood Pressure Medicine: Take your blood pressure medicine with a sip of water the morning of the procedure. . Insulin: Take only   of your normal insulin dose. . Preventing infections: Shower with an antibacterial soap the morning of your procedure. . Build-up your immune system: Take 1000 mg of Vitamin C with every meal (3 times a day) the day prior to your procedure. . Pregnancy: If you are pregnant, call and cancel the procedure. . Sickness: If you have a cold, fever, or any active infections, call and cancel the procedure. . Arrival: You must be in the facility at least 30 minutes prior to your scheduled procedure. . Children: Do not bring children with you. . Dress appropriately: Bring dark clothing that you would not mind if they get stained. . Valuables: Do not bring any jewelry or valuables. Procedure appointments are reserved for interventional treatments only. Marland Kitchen No Prescription Refills. . No medication changes will be discussed during procedure appointments. No disability issues will be discussed.Radiofrequency Lesioning Radiofrequency lesioning is a procedure that is performed to relieve pain. The procedure is often used for back, neck, or arm pain. Radiofrequency lesioning involves the use of a machine that creates radio waves to make heat. During the procedure, the heat is applied to the nerve that carries the pain signal. The heat damages the nerve and interferes with the pain signal. Pain relief usually starts about 2 weeks after the procedure and lasts for 6 months to 1 year. Tell a health care provider about: Any allergies you have. All medicines you are taking, including vitamins, herbs, eye drops, creams, and over-the-counter medicines. Any problems you or family members have had with anesthetic medicines. Any blood disorders you have. Any surgeries you have had. Any medical conditions you have. Whether you are pregnant or may be pregnant. What are the risks? Generally, this is a safe procedure. However, problems may occur, including: Pain or soreness at the injection site. Infection at the  injection site. Damage to nerves or blood vessels. What happens before the procedure? Ask your health care provider about: Changing or stopping your regular medicines. This is especially important if you are taking diabetes medicines or blood thinners. Taking medicines such as aspirin and ibuprofen. These medicines can thin your blood. Do not take these medicines before your procedure if your health care provider instructs you not to. Follow instructions from your health care provider about eating or drinking restrictions. Plan to have someone take you home  after the procedure. If you go home right after the procedure, plan to have someone with you for 24 hours. What happens during the procedure? You will be given one or more of the following: A medicine to help you relax (sedative). A medicine to numb the area (local anesthetic). You will be awake during the procedure. You will need to be able to talk with the health care provider during the procedure. With the help of a type of X-ray (fluoroscopy), the health care provider will insert a radiofrequency needle into the area to be treated. Next, a wire that carries the radio waves (electrode) will be put through the radiofrequency needle. An electrical pulse will be sent through the electrode to verify the correct nerve. You will feel a tingling sensation, and you may have muscle twitching. Then, the tissue that is around the needle tip will be heated by an electric current that is passed using the radiofrequency machine. This will numb the nerves. A bandage (dressing) will be put on the insertion area after the procedure is done. The procedure may vary among health care providers and hospitals. What happens after the procedure? Your blood pressure, heart rate, breathing rate, and blood oxygen level will be monitored often until the medicines you were given have worn off. Return to your normal activities as directed by your health care  provider. This information is not intended to replace advice given to you by your health care provider. Make sure you discuss any questions you have with your health care provider. Document Released: 08/27/2010 Document Revised: 06/06/2015 Document Reviewed: 02/05/2014 Elsevier Interactive Patient Education  2017 [employer]. .

## 2016-06-04 ENCOUNTER — Other Ambulatory Visit: Payer: Self-pay | Admitting: Pain Medicine

## 2016-06-04 ENCOUNTER — Ambulatory Visit: Payer: Medicare Other | Attending: Pain Medicine | Admitting: Pain Medicine

## 2016-06-04 ENCOUNTER — Encounter: Payer: Self-pay | Admitting: Pain Medicine

## 2016-06-04 VITALS — BP 147/83 | HR 92 | Temp 97.8°F | Resp 16 | Ht 69.0 in | Wt 243.0 lb

## 2016-06-04 DIAGNOSIS — M47816 Spondylosis without myelopathy or radiculopathy, lumbar region: Secondary | ICD-10-CM

## 2016-06-04 DIAGNOSIS — M533 Sacrococcygeal disorders, not elsewhere classified: Secondary | ICD-10-CM | POA: Diagnosis not present

## 2016-06-04 DIAGNOSIS — E1165 Type 2 diabetes mellitus with hyperglycemia: Secondary | ICD-10-CM

## 2016-06-04 DIAGNOSIS — I5022 Chronic systolic (congestive) heart failure: Secondary | ICD-10-CM | POA: Insufficient documentation

## 2016-06-04 DIAGNOSIS — Z951 Presence of aortocoronary bypass graft: Secondary | ICD-10-CM | POA: Insufficient documentation

## 2016-06-04 DIAGNOSIS — G8918 Other acute postprocedural pain: Secondary | ICD-10-CM

## 2016-06-04 DIAGNOSIS — Z7982 Long term (current) use of aspirin: Secondary | ICD-10-CM | POA: Insufficient documentation

## 2016-06-04 DIAGNOSIS — Z8249 Family history of ischemic heart disease and other diseases of the circulatory system: Secondary | ICD-10-CM | POA: Diagnosis not present

## 2016-06-04 DIAGNOSIS — I252 Old myocardial infarction: Secondary | ICD-10-CM | POA: Insufficient documentation

## 2016-06-04 DIAGNOSIS — E669 Obesity, unspecified: Secondary | ICD-10-CM | POA: Diagnosis not present

## 2016-06-04 DIAGNOSIS — Z87891 Personal history of nicotine dependence: Secondary | ICD-10-CM | POA: Insufficient documentation

## 2016-06-04 DIAGNOSIS — Z833 Family history of diabetes mellitus: Secondary | ICD-10-CM | POA: Insufficient documentation

## 2016-06-04 DIAGNOSIS — K219 Gastro-esophageal reflux disease without esophagitis: Secondary | ICD-10-CM | POA: Insufficient documentation

## 2016-06-04 DIAGNOSIS — Z79899 Other long term (current) drug therapy: Secondary | ICD-10-CM | POA: Diagnosis not present

## 2016-06-04 DIAGNOSIS — E1142 Type 2 diabetes mellitus with diabetic polyneuropathy: Secondary | ICD-10-CM | POA: Insufficient documentation

## 2016-06-04 DIAGNOSIS — G8929 Other chronic pain: Secondary | ICD-10-CM

## 2016-06-04 DIAGNOSIS — Z888 Allergy status to other drugs, medicaments and biological substances status: Secondary | ICD-10-CM | POA: Insufficient documentation

## 2016-06-04 DIAGNOSIS — Z794 Long term (current) use of insulin: Secondary | ICD-10-CM | POA: Diagnosis not present

## 2016-06-04 DIAGNOSIS — Z6835 Body mass index (BMI) 35.0-35.9, adult: Secondary | ICD-10-CM | POA: Insufficient documentation

## 2016-06-04 DIAGNOSIS — M4696 Unspecified inflammatory spondylopathy, lumbar region: Secondary | ICD-10-CM

## 2016-06-04 DIAGNOSIS — F419 Anxiety disorder, unspecified: Secondary | ICD-10-CM | POA: Diagnosis not present

## 2016-06-04 DIAGNOSIS — M545 Low back pain: Secondary | ICD-10-CM | POA: Diagnosis present

## 2016-06-04 DIAGNOSIS — M488X6 Other specified spondylopathies, lumbar region: Secondary | ICD-10-CM | POA: Diagnosis not present

## 2016-06-04 DIAGNOSIS — E785 Hyperlipidemia, unspecified: Secondary | ICD-10-CM | POA: Diagnosis not present

## 2016-06-04 DIAGNOSIS — Z7951 Long term (current) use of inhaled steroids: Secondary | ICD-10-CM | POA: Insufficient documentation

## 2016-06-04 DIAGNOSIS — E559 Vitamin D deficiency, unspecified: Secondary | ICD-10-CM

## 2016-06-04 DIAGNOSIS — I2510 Atherosclerotic heart disease of native coronary artery without angina pectoris: Secondary | ICD-10-CM | POA: Insufficient documentation

## 2016-06-04 DIAGNOSIS — M170 Bilateral primary osteoarthritis of knee: Secondary | ICD-10-CM | POA: Insufficient documentation

## 2016-06-04 DIAGNOSIS — Z79891 Long term (current) use of opiate analgesic: Secondary | ICD-10-CM | POA: Diagnosis not present

## 2016-06-04 DIAGNOSIS — R209 Unspecified disturbances of skin sensation: Secondary | ICD-10-CM

## 2016-06-04 DIAGNOSIS — J439 Emphysema, unspecified: Secondary | ICD-10-CM | POA: Insufficient documentation

## 2016-06-04 DIAGNOSIS — M4726 Other spondylosis with radiculopathy, lumbar region: Secondary | ICD-10-CM | POA: Insufficient documentation

## 2016-06-04 DIAGNOSIS — Z5181 Encounter for therapeutic drug level monitoring: Secondary | ICD-10-CM | POA: Insufficient documentation

## 2016-06-04 DIAGNOSIS — F1190 Opioid use, unspecified, uncomplicated: Secondary | ICD-10-CM

## 2016-06-04 DIAGNOSIS — G894 Chronic pain syndrome: Secondary | ICD-10-CM

## 2016-06-04 DIAGNOSIS — M25561 Pain in right knee: Secondary | ICD-10-CM

## 2016-06-04 DIAGNOSIS — I110 Hypertensive heart disease with heart failure: Secondary | ICD-10-CM | POA: Insufficient documentation

## 2016-06-04 DIAGNOSIS — M25562 Pain in left knee: Secondary | ICD-10-CM

## 2016-06-04 MED ORDER — OXYCODONE HCL 15 MG PO TABS: 15.0000 mg | ORAL_TABLET | Freq: Four times a day (QID) | ORAL | 0 refills | Status: DC | PRN

## 2016-06-04 NOTE — Progress Notes (Signed)
Nursing Pain Medication Assessment:  Safety precautions to be maintained throughout the outpatient stay will include: orient to surroundings, keep bed in low position, maintain call bell within reach at all times, provide assistance with transfer out of bed and ambulation.  Medication Inspection Compliance: Pill count conducted under aseptic conditions, in front of the patient. Neither the pills nor the bottle was removed from the patient's sight at any time. Once count was completed pills were immediately returned to the patient in their original bottle.  Medication: Oxycodone IR Pill/Patch Count: 11 of 120 pills remain Pill/Patch Appearance: Markings consistent with prescribed medication Bottle Appearance: Standard pharmacy container. Clearly labeled. Filled Date: 04 / 28 / 2018 Last Medication intake:  Today

## 2016-06-04 NOTE — Patient Instructions (Addendum)
____________________________________________________________________________________________  Preparing for Procedure with Sedation Instructions: . Oral Intake: Do not eat or drink anything for at least 8 hours prior to your procedure. . Transportation: Public transportation is not allowed. Bring an adult driver. The driver must be physically present in our waiting room before any procedure can be started. Marland Kitchen Physical Assistance: Bring an adult physically capable of assisting you, in the event you need help. This adult should keep you company at home for at least 6 hours after the procedure. . Blood Pressure Medicine: Take your blood pressure medicine with a sip of water the morning of the procedure. . Blood thinners:  . Diabetics on insulin: Notify the staff so that you can be scheduled 1st case in the morning. If your diabetes requires high dose insulin, take only  of your normal insulin dose the morning of the procedure and notify the staff that you have done so. . Preventing infections: Shower with an antibacterial soap the morning of your procedure. . Build-up your immune system: Take 1000 mg of Vitamin C with every meal (3 times a day) the day prior to your procedure. Marland Kitchen Antibiotics: Inform the staff if you have a condition or reason that requires you to take antibiotics before dental procedures. . Pregnancy: If you are pregnant, call and cancel the procedure. . Sickness: If you have a cold, fever, or any active infections, call and cancel the procedure. . Arrival: You must be in the facility at least 30 minutes prior to your scheduled procedure. . Children: Do not bring children with you. . Dress appropriately: Bring dark clothing that you would not mind if they get stained. . Valuables: Do not bring any jewelry or valuables. Procedure appointments are reserved for interventional treatments only. Marland Kitchen No Prescription Refills. . No medication changes will be discussed during procedure  appointments. . No disability issues will be discussed. ______________________________________________________________________________________________  ____________________________________________________________________________________________  Appointment Policy  It is our goal and responsibility to provide the medical community with assistance in the evaluation and management of patients with chronic pain. Unfortunately our resources are limited. Because we do not have an unlimited amount of time, or available appointments, we are required to closely monitor and manage their use. The following rules exist to maximize their use:  Patient's responsibilities: 1. Punctuality: You are required to be physically present and registered in our facility at least 30 minutes before your appointment. 2. Tardiness: The cutoff is your appointment time. If you have an appointment scheduled for 10:00 AM and you arrive at 10:01, you will be required to reschedule your appointment.  3. Plan ahead: Always assume that you will encounter traffic on your way in. Plan for it. If you are dependent on a driver, make sure they understand these rules and the need to arrive early. 4. Other appointments and responsibilities: Avoid scheduling any other appointments before or after your pain clinic appointments.  5. Be prepared: Write down everything that you need to discuss with your healthcare provider and give this information to the admitting nurse. Write down the medications that you will need refilled. Bring your pills and bottles (even the empty ones), to all of your appointments, except for those where a procedure is scheduled. 6. No children or pets: Find someone to take care of them. It is not appropriate to bring them in. 7. Scheduling changes: We request "advanced notification" of any changes or cancellations. 8. Advanced notification: Defined as a time period of more than 24 hours prior to the originally scheduled  appointment. This allows for the appointment to be offered to other patients. 9. Rescheduling: When a visit is rescheduled, it will require the cancellation of the original appointment. For this reason they both fall within the category of "Cancellations".  10. Cancellations: They require advanced notification. Any cancellation less than 24 hours before the  appointment will be recorded as a "No Show". 11. No Show: Defined as an unkept appointment where the patient failed to notify or declare to the practice their intention or inability to keep the appointment.  Corrective process for repeat offenders:  1. Tardiness: Three (3) episodes of rescheduling due to late arrivals will be recorded as one (1) "No Show". 2. Cancellation or reschedule: Three (3) cancellations or rescheduling will be recorded as one (1) "No Show". 3. "No Shows": Three (3) "No Shows" within a 12 month period will result in discharge from the practice.  ____________________________________________________________________________________________  ____________________________________________________________________________________________  Medication Rules  Applies to: All patients receiving prescriptions (written or electronic).  Pharmacy of record: Pharmacy where electronic prescriptions will be sent. If written prescriptions are taken to a different pharmacy, please inform the nursing staff. The pharmacy listed in the electronic medical record should be the one where you would like electronic prescriptions to be sent.  Prescription refills: Only during scheduled appointments. Applies to both, written and electronic prescriptions.  NOTE: The following applies primarily to controlled substances (Opioid Pain Medications)  Patient's responsibilities: 12. Pain Pills: Bring all pain pills to every appointment (except for procedure appointments). 13. Pill Bottles: Bring pills in original pharmacy bottle. Always bring newest  bottle. Bring bottle, even if empty. 14. Medication refills: You are responsible for knowing and keeping track of what medications you need refilled. The day before your appointment, write a list of all prescriptions that need to be refilled. Bring that list to your appointment and give it to the admitting nurse. Prescriptions will be written only during appointments. If you forget a medication, it will not be "Called in", "Faxed", or "electronically sent". You will need to get another appointment to get these prescribed. 15. Prescription Accuracy: You are responsible for carefully inspecting your prescriptions before leaving our office. Have the discharge nurse carefully go over each prescription with you, before taking them home. Make sure that your name is accurately spelled, that your address is correct. Check the name and dose of your medication to make sure it is accurate. Check the number of pills, and the written instructions to make sure they are clear and accurate. Make sure that you are given enough medication to last until your next medication refill appointment. 16. Taking Medication: Take medication as prescribed. Never take more pills than instructed. Never take medication more frequently than prescribed. Taking less pills or less frequently is permitted and encouraged, when it comes to controlled substances (written prescriptions).  17. Inform other Doctors: Always inform, all of your healthcare providers, of all the medications you take. 18. Pain Medication from other Providers: You are not allowed to accept any additional pain medication from any other Doctor or Healthcare provider. There are two exceptions to this rule. (see below) In the event that you require additional pain medication, you are responsible for notifying us, as stated below. 19. Medication Agreement: You are responsible for carefully reading and following our Medication Agreement. This must be signed before receiving any  prescriptions from our practice. Safely store a copy of your signed Agreement. Violations to the Agreement will result in no further prescriptions. (Additional copies of our  Medication Agreement are available upon request.) 20. Laws, Rules, & Regulations: All patients are expected to follow all [address] and [employer], Rules, [employer]. Ignorance of the Laws does not constitute a valid excuse.  Exceptions: There are only two exceptions to the rule of not receiving pain medications from other Healthcare Providers. 4. Exception #1 (Emergencies): In the event of an emergency (i.e.: accident requiring emergency care), you are allowed to receive additional pain medication. However, you are responsible for: As soon as you are able, call our office [phone], at any time of the day or night, and leave a message stating your name, the date and nature of the emergency, and the name and dose of the medication prescribed. In the event that your call is answered by a member of our staff, make sure to document and save the date, time, and the name of the person that took your information.  5. Exception #2 (Planned Surgery): In the event that you are scheduled by another doctor or dentist to have any type of surgery or procedure, you are allowed (for a period no longer than 30 days), to receive additional pain medication, for the acute post-op pain. However, in this case, you are responsible for picking up a copy of our "Post-op Pain Management for Surgeons" handout, and giving it to your surgeon or dentist. This document is available at our office, and does not require an appointment to obtain it. Simply go to our office during business hours (Monday-Thursday from 8:00 AM to 4:00 PM) (Friday 8:00 AM to 12:00 Noon) or if you have a scheduled appointment with [place], prior to your surgery, and ask for it by name. In addition, you will need to provide [place] with your name, name of your surgeon, type of surgery, and  date of procedure or surgery.  _____________________________________________________________________________________________  ____________________________________________________________________________________________  DRUG HOLIDAYS  Definitions Tolerance: defined as the progressively decreased responsiveness to a drug. Occurs when the drug is used repeatedly and the body adapts to the continued presence of the drug. As a result, a larger dose of the drug is needed to achieve the effect originally obtained by a smaller dose. It is thought to be due to the formation of excess opioid receptors.  Drug Holiday: is when a patient stops taking a medication(s) for a period of time; anywhere from a few days to several weeks.  Withdrawals: refers to the wide range of symptoms that occur after stopping or dramatically reducing opiate drugs after heavy and prolonged use. Withdrawal symptoms do not occur to patients that use low dose opioids, or those who take the medication sporadically. Contrary to benzodiazepine (example: Valium, Xanax, etc.) or alcohol withdrawals ("Delirium Tremens"), opioid withdrawals are not lethal. Withdrawals are the physical manifestation of the body getting rid of the excess receptors.  Purpose To eliminate tolerance.  Duration of Holiday 14 consecutive days. (2 weeks)  Expected Symptoms Early symptoms of withdrawal include: . Agitation . Anxiety . Muscle aches . Increased tearing . Insomnia . Runny nose . Sweating . Yawning  Late symptoms of withdrawal include: . Abdominal cramping . Diarrhea . Dilated pupils . Goose bumps . Nausea . Vomiting  Opioid withdrawal reactions are very uncomfortable but are not life-threatening. Symptoms usually start within 12 hours of last opioid dose and within 30 hours of last methadone exposure.  Duration of Symptoms 48 to 72 hours for short acting medications and 2 to 14 days for methadone.  Treatment . Clonidine  (CatapresT) or tizanidine (  ZanaflexT) for agitation, sweating, tearing, runny nose. . Promethazine (PhenerganT) for nausea, vomiting. Marland Kitchen NSAIDs for pain.  Benefits . Improved effectiveness of opioids. . Decreased opioid dose needed to achieve benefits. . Improved pain with lesser dose. ____________________________________________________________________________________________  Radiofrequency Lesioning Radiofrequency lesioning is a procedure that is performed to relieve pain. The procedure is often used for back, neck, or arm pain. Radiofrequency lesioning involves the use of a machine that creates radio waves to make heat. During the procedure, the heat is applied to the nerve that carries the pain signal. The heat damages the nerve and interferes with the pain signal. Pain relief usually starts about 2 weeks after the procedure and lasts for 6 months to 1 year. Tell a health care provider about:  Any allergies you have.  All medicines you are taking, including vitamins, herbs, eye drops, creams, and over-the-counter medicines.  Any problems you or family members have had with anesthetic medicines.  Any blood disorders you have.  Any surgeries you have had.  Any medical conditions you have.  Whether you are pregnant or may be pregnant. What are the risks? Generally, this is a safe procedure. However, problems may occur, including:  Pain or soreness at the injection site.  Infection at the injection site.  Damage to nerves or blood vessels. What happens before the procedure?  Ask your health care provider about:  Changing or stopping your regular medicines. This is especially important if you are taking diabetes medicines or blood thinners.  Taking medicines such as aspirin and ibuprofen. These medicines can thin your blood. Do not take these medicines before your procedure if your health care provider instructs you not to.  Follow instructions from your health care provider  about eating or drinking restrictions.  Plan to have someone take you home after the procedure.  If you go home right after the procedure, plan to have someone with you for 24 hours. What happens during the procedure?  You will be given one or more of the following:  A medicine to help you relax (sedative).  A medicine to numb the area (local anesthetic).  You will be awake during the procedure. You will need to be able to talk with the health care provider during the procedure.  With the help of a type of X-ray (fluoroscopy), the health care provider will insert a radiofrequency needle into the area to be treated.  Next, a wire that carries the radio waves (electrode) will be put through the radiofrequency needle. An electrical pulse will be sent through the electrode to verify the correct nerve. You will feel a tingling sensation, and you may have muscle twitching.  Then, the tissue that is around the needle tip will be heated by an electric current that is passed using the radiofrequency machine. This will numb the nerves.  A bandage (dressing) will be put on the insertion area after the procedure is done. The procedure may vary among health care providers and hospitals. What happens after the procedure?  Your blood pressure, heart rate, breathing rate, and blood oxygen level will be monitored often until the medicines you were given have worn off.  Return to your normal activities as directed by your health care provider. This information is not intended to replace advice given to you by your health care provider. Make sure you discuss any questions you have with your health care provider. Document Released: 08/27/2010 Document Revised: 06/06/2015 Document Reviewed: 02/05/2014 Elsevier Interactive Patient Education  2017 [employer]. Radiofrequency  Lesioning, Care After Refer to this sheet in the next few weeks. These instructions provide you with information about caring for  yourself after your procedure. Your health care provider may also give you more specific instructions. Your treatment has been planned according to current medical practices, but problems sometimes occur. Call your health care provider if you have any problems or questions after your procedure. What can I expect after the procedure? After the procedure, it is common to have:  Pain from the burned nerve.  Temporary numbness. Follow these instructions at home:  Take over-the-counter and prescription medicines only as told by your health care provider.  Return to your normal activities as told by your health care provider. Ask your health care provider what activities are safe for you.  Pay close attention to how you feel after the procedure. If you start to have pain, write down when it hurts and how it feels. This will help you and your health care provider to know if you need an additional treatment.  Check your needle insertion site every day for signs of infection. Watch for:  Redness, swelling, or pain.  Fluid, blood, or pus.  Keep all follow-up visits as told by your health care provider. This is important. Contact a health care provider if:  Your pain does not get better.  You have redness, swelling, or pain at the needle insertion site.  You have fluid, blood, or pus coming from the needle insertion site.  You have a fever. Get help right away if:  You develop sudden, severe pain.  You develop numbness or tingling near the procedure site that does not go away. This information is not intended to replace advice given to you by your health care provider. Make sure you discuss any questions you have with your health care provider. Document Released: 08/28/2010 Document Revised: 06/06/2015 Document Reviewed: 02/05/2014 Elsevier Interactive Patient Education  2017 [employer] Pain Management Discharge Instructions  General Discharge Instructions :  If you need to reach  your doctor call: Monday-Friday 8:00 am - 4:00 pm at [phone] or toll free [phone].  After clinic hours [phone] to have operator reach doctor.  Bring all of your medication bottles to all your appointments in the pain clinic.  To cancel or reschedule your appointment with Pain Management please remember to call 24 hours in advance to avoid a fee.  Refer to the educational materials which you have been given on: General Risks, I had my Procedure. Discharge Instructions, Post Sedation.  Post Procedure Instructions:  The drugs you were given will stay in your system until tomorrow, so for the next 24 hours you should not drive, make any legal decisions or drink any alcoholic beverages.  You may eat anything you prefer, but it is better to start with liquids then soups and crackers, and gradually work up to solid foods.  Please notify your doctor immediately if you have any unusual bleeding, trouble breathing or pain that is not related to your normal pain.  Depending on the type of procedure that was done, some parts of your body may feel week and/or numb.  This usually clears up by tonight or the next day.  Walk with the use of an assistive device or accompanied by an adult for the 24 hours.  You may use ice on the affected area for the first 24 hours.  Put ice in a Ziploc bag and cover with a towel and place against area 15 minutes on 15 minutes  off.  You may switch to heat after 24 hours.GENERAL RISKS AND COMPLICATIONS  What are the risk, side effects and possible complications? Generally speaking, most procedures are safe.  However, with any procedure there are risks, side effects, and the possibility of complications.  The risks and complications are dependent upon the sites that are lesioned, or the type of nerve block to be performed.  The closer the procedure is to the spine, the more serious the risks are.  Great care is taken when placing the radio frequency needles,  block needles or lesioning probes, but sometimes complications can occur. 1. Infection: Any time there is an injection through the skin, there is a risk of infection.  This is why sterile conditions are used for these blocks.  There are four possible types of infection. 1. Localized skin infection. 2. Central Nervous System Infection-This can be in the form of Meningitis, which can be deadly. 3. Epidural Infections-This can be in the form of an epidural abscess, which can cause pressure inside of the spine, causing compression of the spinal cord with subsequent paralysis. This would require an emergency surgery to decompress, and there are no guarantees that the patient would recover from the paralysis. 4. Discitis-This is an infection of the intervertebral discs.  It occurs in about 1% of discography procedures.  It is difficult to treat and it may lead to surgery.        2. Pain: the needles have to go through skin and soft tissues, will cause soreness.       3. Damage to internal structures:  The nerves to be lesioned may be near blood vessels or    other nerves which can be potentially damaged.       4. Bleeding: Bleeding is more common if the patient is taking blood thinners such as  aspirin, Coumadin, Ticiid, Plavix, etc., or if he/she have some genetic predisposition  such as hemophilia. Bleeding into the spinal canal can cause compression of the spinal  cord with subsequent paralysis.  This would require an emergency surgery to  decompress and there are no guarantees that the patient would recover from the  paralysis.       5. Pneumothorax:  Puncturing of a lung is a possibility, every time a needle is introduced in  the area of the chest or upper back.  Pneumothorax refers to free air around the  collapsed lung(s), inside of the thoracic cavity (chest cavity).  Another two possible  complications related to a similar event would include: Hemothorax and Chylothorax.   These are variations of the  Pneumothorax, where instead of air around the collapsed  lung(s), you may have blood or chyle, respectively.       6. Spinal headaches: They may occur with any procedures in the area of the spine.       7. Persistent CSF (Cerebro-Spinal Fluid) leakage: This is a rare problem, but may occur  with prolonged intrathecal or epidural catheters either due to the formation of a fistulous  track or a dural tear.       8. Nerve damage: By working so close to the spinal cord, there is always a possibility of  nerve damage, which could be as serious as a permanent spinal cord injury with  paralysis.       9. Death:  Although rare, severe deadly allergic reactions known as "Anaphylactic  reaction" can occur to any of the medications used.      10. Worsening  of the symptoms:  We can always make thing worse.  What are the chances of something like this happening? Chances of any of this occuring are extremely low.  By statistics, you have more of a chance of getting killed in a motor vehicle accident: while driving to the hospital than any of the above occurring .  Nevertheless, you should be aware that they are possibilities.  In general, it is similar to taking a shower.  Everybody knows that you can slip, hit your head and get killed.  Does that mean that you should not shower again?  Nevertheless always keep in mind that statistics do not mean anything if you happen to be on the wrong side of them.  Even if a procedure has a 1 (one) in a 1,000,000 (million) chance of going wrong, it you happen to be that one..Also, keep in mind that by statistics, you have more of a chance of having something go wrong when taking medications.  Who should not have this procedure? If you are on a blood thinning medication (e.g. Coumadin, Plavix, see list of "Blood Thinners"), or if you have an active infection going on, you should not have the procedure.  If you are taking any blood thinners, please inform your physician.  How  should I prepare for this procedure?  Do not eat or drink anything at least six hours prior to the procedure.  Bring a driver with you .  It cannot be a taxi.  Come accompanied by an adult that can drive you back, and that is strong enough to help you if your legs get weak or numb from the local anesthetic.  Take all of your medicines the morning of the procedure with just enough water to swallow them.  If you have diabetes, make sure that you are scheduled to have your procedure done first thing in the morning, whenever possible.  If you have diabetes, take only half of your insulin dose and notify our nurse that you have done so as soon as you arrive at the clinic.  If you are diabetic, but only take blood sugar pills (oral hypoglycemic), then do not take them on the morning of your procedure.  You may take them after you have had the procedure.  Do not take aspirin or any aspirin-containing medications, at least eleven (11) days prior to the procedure.  They may prolong bleeding.  Wear loose fitting clothing that may be easy to take off and that you would not mind if it got stained with Betadine or blood.  Do not wear any jewelry or perfume  Remove any nail coloring.  It will interfere with some of our monitoring equipment.  NOTE: Remember that this is not meant to be interpreted as a complete list of all possible complications.  Unforeseen problems may occur.  BLOOD THINNERS The following drugs contain aspirin or other products, which can cause increased bleeding during surgery and should not be taken for 2 weeks prior to and 1 week after surgery.  If you should need take something for relief of minor pain, you may take acetaminophen which is found in Tylenol,m Datril, Anacin-3 and Panadol. It is not blood thinner. The products listed below are.  Do not take any of the products listed below in addition to any listed on your instruction sheet.  A.P.C or A.P.C with Codeine Codeine  Phosphate Capsules #3 Ibuprofen Ridaura  ABC compound Congesprin Imuran rimadil  Advil Cope Indocin Robaxisal  Alka-Seltzer Effervescent  Pain Reliever and Antacid Coricidin or Coricidin-D  Indomethacin Rufen  Alka-Seltzer plus Cold Medicine Cosprin Ketoprofen S-A-C Tablets  Anacin Analgesic Tablets or Capsules Coumadin Korlgesic Salflex  Anacin Extra Strength Analgesic tablets or capsules CP-2 Tablets Lanoril Salicylate  Anaprox Cuprimine Capsules Levenox Salocol  Anexsia-D Dalteparin Magan Salsalate  Anodynos Darvon compound Magnesium Salicylate Sine-off  Ansaid Dasin Capsules Magsal Sodium Salicylate  Anturane Depen Capsules Marnal Soma  APF Arthritis pain formula Dewitt's Pills Measurin Stanback  Argesic Dia-Gesic Meclofenamic Sulfinpyrazone  Arthritis Bayer Timed Release Aspirin Diclofenac Meclomen Sulindac  Arthritis pain formula Anacin Dicumarol Medipren Supac  Analgesic (Safety coated) Arthralgen Diffunasal Mefanamic Suprofen  Arthritis Strength Bufferin Dihydrocodeine Mepro Compound Suprol  Arthropan liquid Dopirydamole Methcarbomol with Aspirin Synalgos  ASA tablets/Enseals Disalcid Micrainin Tagament  Ascriptin Doan's Midol Talwin  Ascriptin A/D Dolene Mobidin Tanderil  Ascriptin Extra Strength Dolobid Moblgesic Ticlid  Ascriptin with Codeine Doloprin or Doloprin with Codeine Momentum Tolectin  Asperbuf Duoprin Mono-gesic Trendar  Aspergum Duradyne Motrin or Motrin IB Triminicin  Aspirin plain, buffered or enteric coated Durasal Myochrisine Trigesic  Aspirin Suppositories Easprin Nalfon Trillsate  Aspirin with Codeine Ecotrin Regular or Extra Strength Naprosyn Uracel  Atromid-S Efficin Naproxen Ursinus  Auranofin Capsules Elmiron Neocylate Vanquish  Axotal Emagrin Norgesic Verin  Azathioprine Empirin or Empirin with Codeine Normiflo Vitamin E  Azolid Emprazil Nuprin Voltaren  Bayer Aspirin plain, buffered or children's or timed BC Tablets or powders Encaprin Orgaran  Warfarin Sodium  Buff-a-Comp Enoxaparin Orudis Zorpin  Buff-a-Comp with Codeine Equegesic Os-Cal-Gesic   Buffaprin Excedrin plain, buffered or Extra Strength Oxalid   Bufferin Arthritis Strength Feldene Oxphenbutazone   Bufferin plain or Extra Strength Feldene Capsules Oxycodone with Aspirin   Bufferin with Codeine Fenoprofen Fenoprofen Pabalate or Pabalate-SF   Buffets II Flogesic Panagesic   Buffinol plain or Extra Strength Florinal or Florinal with Codeine Panwarfarin   Buf-Tabs Flurbiprofen Penicillamine   Butalbital Compound Four-way cold tablets Penicillin   Butazolidin Fragmin Pepto-Bismol   Carbenicillin Geminisyn Percodan   Carna Arthritis Reliever Geopen Persantine   Carprofen Gold's salt Persistin   Chloramphenicol Goody's Phenylbutazone   Chloromycetin Haltrain Piroxlcam   Clmetidine heparin Plaquenil   Cllnoril Hyco-pap Ponstel   Clofibrate Hydroxy chloroquine Propoxyphen         Before stopping any of these medications, be sure to consult the physician who ordered them.  Some, such as Coumadin (Warfarin) are ordered to prevent or treat serious conditions such as "deep thrombosis", "pumonary embolisms", and other heart problems.  The amount of time that you may need off of the medication may also vary with the medication and the reason for which you were taking it.  If you are taking any of these medications, please make sure you notify your pain physician before you undergo any procedures.          Facet Joint Block The facet joints connect the bones of the spine (vertebrae). They make it possible for you to bend, twist, and make other movements with your spine. They also keep you from bending too far, twisting too far, and making other excessive movements. A facet joint block is a procedure where a numbing medicine (anesthetic) is injected into a facet joint. Often, a type of anti-inflammatory medicine called a steroid is also injected. A facet joint block may be done  to diagnose neck or back pain. If the pain gets better after a facet joint block, it means the pain is probably coming from the facet joint. If the pain  does not get better, it means the pain is probably not coming from the facet joint. A facet joint block may also be done to relieve neck or back pain caused by an inflamed facet joint. A facet joint block is only done to relieve pain if the pain does not improve with other methods, such as medicine, exercise programs, and physical therapy. Tell a health care provider about:  Any allergies you have.  All medicines you are taking, including vitamins, herbs, eye drops, creams, and over-the-counter medicines.  Any problems you or family members have had with anesthetic medicines.  Any blood disorders you have.  Any surgeries you have had.  Any medical conditions you have.  Whether you are pregnant or may be pregnant. What are the risks? Generally, this is a safe procedure. However, problems may occur, including:  Bleeding.  Injury to a nerve near the injection site.  Pain at the injection site.  Weakness or numbness in areas controlled by nerves near the injection site.  Infection.  Temporary fluid retention.  Allergic reactions to medicines or dyes.  Injury to other structures or organs near the injection site. What happens before the procedure?  Follow instructions from your health care provider about eating or drinking restrictions.  Ask your health care provider about:  Changing or stopping your regular medicines. This is especially important if you are taking diabetes medicines or blood thinners.  Taking medicines such as aspirin and ibuprofen. These medicines can thin your blood. Do not take these medicines before your procedure if your health care provider instructs you not to.  Do not take any new dietary supplements or medicines without asking your health care provider first.  Plan to have someone take you home  after the procedure. What happens during the procedure?  You may need to remove your clothing and dress in an open-back gown.  The procedure will be done while you are lying on an X-ray table. You will most likely be asked to lie on your stomach, but you may be asked to lie in a different position if an injection will be made in your neck.  Machines will be used to monitor your oxygen levels, heart rate, and blood pressure.  If an injection will be made in your neck, an IV tube will be inserted into one of your veins. Fluids and medicine will flow directly into your body through the IV tube.  The area over the facet joint where the injection will be made will be cleaned with soap. The surrounding skin will be covered with clean drapes.  A numbing medicine (local anesthetic) will be applied to your skin. Your skin may sting or burn for a moment.  A video X-ray machine (fluoroscopy) will be used to locate the joint. In some cases, a CT scan may be used.  A contrast dye may be injected into the facet joint area to help locate the joint.  When the joint is located, an anesthetic will be injected into the joint through the needle.  Your health care provider will ask you whether you feel pain relief. If you do feel relief, a steroid may be injected to provide pain relief for a longer period of time. If you do not feel relief or feel only partial relief, additional injections of an anesthetic may be made in other facet joints.  The needle will be removed.  Your skin will be cleaned.  A bandage (dressing) will be applied over each injection site. The procedure  may vary among health care providers and hospitals. What happens after the procedure?  You will be observed for 15-30 minutes before being allowed to go home. This information is not intended to replace advice given to you by your health care provider. Make sure you discuss any questions you have with your health care  provider. Document Released: 05/20/2006 Document Revised: 01/30/2015 Document Reviewed: 09/24/2014 Elsevier Interactive Patient Education  2017 [employer]  Facet Joint Block, Care After Refer to this sheet in the next few weeks. These instructions provide you with information about caring for yourself after your procedure. Your health care provider may also give you more specific instructions. Your treatment has been planned according to current medical practices, but problems sometimes occur. Call your health care provider if you have any problems or questions after your procedure. What can I expect after the procedure? After the procedure, it is common to have:  Some tenderness over the injection sites for 2 days after the procedure.  A temporary increase in blood sugar if you have diabetes. Follow these instructions at home:  Keep track of the amount of pain relief you feel and how long it lasts.  Take over-the-counter and prescription medicines only as told by your health care provider. You may need to limit pain medicine within the first 4-6 hours after the procedure.  Remove your bandages (dressings) the morning after the procedure.  For the first 24 hours after the procedure:  Do not apply heat near or over the injection sites.  Do not take a bath or soak in water, such as in a pool or lake.  Do not drive or operate heavy machinery unless approved by your health care provider.  Avoid activities that require a lot of energy.  If the injection site is tender, try applying ice to the area. To do this:  Put ice in a plastic bag.  Place a towel between your skin and the bag.  Leave the ice on for 20 minutes, 2-3 times a day.  Keep all follow-up visits as told by your health care provider. This is important. Contact a health care provider if:  Fluid is coming from an injection site.  There is significant bleeding or swelling at an injection site.  You have diabetes  and your blood sugar is above 180 mg/dL. Get help right away if:  You have a fever.  You have worsening pain or swelling around an injection site.  There are red streaks around an injection site.  You develop severe pain that is not controlled by your medicines.  You develop a headache, stiff neck, nausea, or vomiting.  Your eyes become very sensitive to light.  You have weakness, paralysis, or tingling in your arms or legs that was not present before the procedure.  You have difficulty urinating or breathing. This information is not intended to replace advice given to you by your health care provider. Make sure you discuss any questions you have with your health care provider. Document Released: 12/16/2011 Document Revised: 05/15/2015 Document Reviewed: 09/24/2014 Elsevier Interactive Patient Education  2017 [employer].

## 2016-06-04 NOTE — Progress Notes (Signed)
Patient's Name: Manuel Knight  MRN: [id number]  Referring Provider: [facility]  DOB: 1960-04-07  PCP: [facility]  DOS: 06/04/2016  Note by: Kathlen Brunswick. Dossie Arbour, MD  Service setting: Ambulatory outpatient  Specialty: Interventional Pain Management  Location: [facility] (AMB) Pain Management Facility    Patient type: Established   Primary Reason(s) for Visit: Encounter for prescription drug management (Level of risk: moderate) CC: Back Pain (lower) and Knee Pain (left)  HPI  Manuel Knight is a 56 y.o. year old, male patient, who comes today for a medication management evaluation. He has Type 2 diabetes mellitus with hyperglycemia, with long-term current use of insulin ([place]); HLD (hyperlipidemia); Obesity, unspecified; Anxiety state; Essential hypertension; COPD; PULMONARY NODULE; HERNIA; GERD (gastroesophageal reflux disease); Esophageal dysphagia; NSTEMI (non-ST elevated myocardial infarction) ([place]); CAD (coronary artery disease), native coronary artery; S/P CABG x 3; Chronic systolic heart failure ([place]); Emphysema of lung ([place]); Lumbar spondylosis with lumbar radiculitis (Left); H/O coronary artery bypass surgery; Encounter for pain management planning; Encounter for therapeutic drug level monitoring; Opiate use (90 MME/Day); Chronic low back pain (Location of Secondary source of pain) (Bilateral) (L>R); Chronic knee pain (Location of Primary Source of Pain) (Bilateral) (L>R); Chronic foot pain (Bilateral) (L>R); Disturbance of skin sensation; Diabetic peripheral neuropathy (HCC) (lower extremity); Chronic lumbar radicular pain (L5 dermatome) (Location of Tertiary source of pain) (Left); 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) (L>R); Osteoarthritis of knee (Bilateral) (L>R); Lumbar facet syndrome (Location of Secondary source of pain) (Bilateral) (L>R); Lumbar facet arthropathy ([place]); Chronic lower extremity pain (Location of  Tertiary source of pain) (Bilateral) (L>R); History of marijuana use; Hypomagnesemia; Vitamin D insufficiency; Long term current use of opiate analgesic; Long term prescription opiate use; Chronic pain syndrome; SIRS (systemic inflammatory response syndrome) ([place]); Spondylosis of lumbar spine; and Acute postoperative pain on his problem list. His primarily concern today is the Back Pain (lower) and Knee Pain (left)  Pain Assessment: Self-Reported Pain Score: 4 /10             Reported level is compatible with observation.       Pain Type: Chronic pain Pain Location: Knee Pain Orientation: Left Pain Descriptors / Indicators: Aching, Constant Pain Frequency: Constant  Manuel Knight was last scheduled for an appointment on 05/11/2016 for medication management. During today's appointment we reviewed Manuel Knight's chronic pain status, as well as his outpatient medication regimen.  The patient  reports that he does not use drugs. His body mass index is 35.88 kg/m.  Further details on both, my assessment(s), as well as the proposed treatment plan, please see below.  Controlled Substance Pharmacotherapy Assessment REMS (Risk Evaluation and Mitigation Strategy)  Analgesic:Oxycodone IR 15 mg every 6 hours (60 mg/day) MME/day:90 mg/day. Lona Millard, RN  06/04/2016  9:24 AM  Sign at close encounter Nursing Pain Medication Assessment:  Safety precautions to be maintained throughout the outpatient stay will include: orient to surroundings, keep bed in low position, maintain call bell within reach at all times, provide assistance with transfer out of bed and ambulation.  Medication Inspection Compliance: Pill count conducted under aseptic conditions, in front of the patient. Neither the pills nor the bottle was removed from the patient's sight at any time. Once count was completed pills were immediately returned to the patient in their original bottle.  Medication: Oxycodone IR Pill/Patch Count: 11 of  120 pills remain Pill/Patch Appearance: Markings consistent with prescribed medication Bottle Appearance: Standard pharmacy  container. Clearly labeled. Filled Date: 04 / 28 / 2018 Last Medication intake:  Today   Pharmacokinetics: Liberation and absorption (onset of action): WNL Distribution (time to peak effect): WNL Metabolism and excretion (duration of action): WNL         Pharmacodynamics: Desired effects: Analgesia: Mr. Pio reports >50% benefit. Functional ability: Patient reports that medication allows him to accomplish basic ADLs Clinically meaningful improvement in function (CMIF): Sustained CMIF goals met Perceived effectiveness: Described as relatively effective, allowing for increase in activities of daily living (ADL) Undesirable effects: Side-effects or Adverse reactions: None reported Monitoring: [place] PMP: Online review of the past 31-monthperiod conducted. Compliant with practice rules and regulations List of all UDS test(s) done:  Lab Results  Component Value Date   TOXASSSELUR FINAL 08/27/2015   SUMMARY FINAL 06/13/2015   Last UDS on record: ToxAssure Select 13  Date Value Ref Range Status  08/27/2015 FINAL  Final    Comment:    ==================================================================== TOXASSURE SELECT 13 (MW) ==================================================================== Test                             Result       Flag       Units Drug Present and Declared for Prescription Verification   Alprazolam                     128          EXPECTED   ng/mg creat   Alpha-hydroxyalprazolam        63           EXPECTED   ng/mg creat    Source of alprazolam is a scheduled prescription medication.    Alpha-hydroxyalprazolam is an expected metabolite of alprazolam.   Oxycodone                      1064         EXPECTED   ng/mg creat   Oxymorphone                    107          EXPECTED   ng/mg creat   Noroxycodone                   576           EXPECTED   ng/mg creat    Sources of oxycodone include scheduled prescription medications.    Oxymorphone and noroxycodone are expected metabolites of    oxycodone. Oxymorphone is also available as a scheduled    prescription medication. Drug Present not Declared for Prescription Verification   Alcohol, Ethyl                 >0.400       UNEXPECTED g/dL    Sources of ethyl alcohol include alcoholic beverages or as a    fermentation product of glucose; glucose is present in this    specimen.  The high concentration of ethyl alcohol and the    presence of glucose supports fermentation as the source of ethyl    alcohol in this specimen. Drug Absent but Declared for Prescription Verification   Amphetamine                    Not Detected UNEXPECTED ng/mg creat ==================================================================== Test  Result    Flag   Units      Ref Range   Creatinine              102              mg/dL      >=20 ==================================================================== Declared Medications:  The flagging and interpretation on this report are based on the  following declared medications.  Unexpected results may arise from  inaccuracies in the declared medications.  **Note: The testing scope of this panel includes these medications:  Alprazolam (Xanax)  Amphetamine (Adderall)  Oxycodone (Roxicodone)  **Note: The testing scope of this panel does not include following  reported medications:  Albuterol (ProAir)  Albuterol (Proventil)  Amlodipine (Norvasc)  Aspirin  Atorvastatin (Lipitor)  Benazepril (Lotensin)  Canagliflozin (Invokana)  Carvedilol (Coreg)  Citalopram (Celexa)  Clonidine (Catapres)  Duloxetine (Cymbalta)  Fluticasone (Advair)  Gabapentin  Insulin (Levemir)  Insulin (NovoLog)  Magnesium  Meloxicam (Mobic)  Metformin (Glucophage)  Mupirocin (Bactroban)  Naloxone  Nitroglycerin (Nitrostat)  Pantoprazole (Protonix)   Sacubitril (Entresto)  Salmeterol (Advair)  Tiotropium (Spiriva)  Valsartan (Entresto)  Vitamin D2 (Drisdol) ==================================================================== For clinical consultation, please call [phone]. ====================================================================    UDS interpretation: Unexpected findings not considered significantly abnormal          Medication Assessment Form: Reviewed. Patient indicates being compliant with therapy Treatment compliance: Compliant Risk Assessment Profile: Aberrant behavior: See prior evaluations. None observed or detected today Comorbid factors increasing risk of overdose: See prior notes. No additional risks detected today Risk of substance use disorder (SUD): Low Opioid Risk Tool (ORT) Total Score: 0  Interpretation Table:  Score <3 = Low Risk for SUD  Score between 4-7 = Moderate Risk for SUD  Score >8 = High Risk for Opioid Abuse   Risk Mitigation Strategies:  Patient Counseling: Covered Patient-Prescriber Agreement (PPA): Present and active  Notification to other healthcare providers: Done  Pharmacologic Plan: No change in therapy, at this time  Laboratory Chemistry  Inflammation Markers Lab Results  Component Value Date   CRP 0.8 06/13/2015   ESRSEDRATE 2 06/13/2015   (CRP: Acute Phase) (ESR: Chronic Phase) Renal Function Markers Lab Results  Component Value Date   BUN 15 06/13/2015   CREATININE 0.98 06/13/2015   GFRAA >60 06/13/2015   GFRNONAA >60 06/13/2015   Hepatic Function Markers Lab Results  Component Value Date   AST 17 06/13/2015   ALT 19 06/13/2015   ALBUMIN 4.0 06/13/2015   ALKPHOS 58 06/13/2015   HCVAB NEGATIVE 11/22/2008   Electrolytes Lab Results  Component Value Date   NA 138 06/13/2015   K 4.6 06/13/2015   CL 107 06/13/2015   CALCIUM 9.4 06/13/2015   MG 1.4 (L) 06/13/2015   Neuropathy Markers Lab Results  Component Value Date   VITAMINB12 1,002 (H)  06/13/2015   Bone Pathology Markers Lab Results  Component Value Date   ALKPHOS 58 06/13/2015   VD25OH 31 02/03/2010   25OHVITD1 25 (L) 06/13/2015   25OHVITD2 <1.0 06/13/2015   25OHVITD3 25 06/13/2015   CALCIUM 9.4 06/13/2015   Coagulation Parameters Lab Results  Component Value Date   INR 1.59 (H) 02/06/2013   LABPROT 18.5 (H) 02/06/2013   APTT 40 (H) 02/06/2013   PLT 307 03/15/2015   Cardiovascular Markers Lab Results  Component Value Date   HGB 14.5 03/15/2015   HCT 45.5 03/15/2015   Note: Lab results reviewed.  Recent Diagnostic Imaging Review  Dg C-arm 1-60 Min-no Report  Result Date: 03/30/2016 Fluoroscopy was utilized by the requesting physician.  No radiographic interpretation.   Note: Imaging results reviewed.          Meds  The patient has a current medication list which includes the following prescription(s): albuterol, albuterol, alprazolam, amlodipine, amphetamine-dextroamphetamine, aspirin ec, atorvastatin, benazepril, canagliflozin, carvedilol, citalopram, clonidine, d3 super strength, duloxetine, fluticasone, fluticasone-salmeterol, gabapentin, gabapentin, insulin aspart, insulin pen needle, insulin syringe-needle u-100, insulin syringe-needle u-100, levemir flextouch, magnesium oxide, meloxicam, metformin, naloxone, naloxone, nitroglycerin, oxycodone, oxycodone, oxycodone, pantoprazole, sacubitril-valsartan, sildenafil, sure comfort lancets 30g, tiotropium, and truetrack test.  Current Outpatient Prescriptions on File Prior to Visit  Medication Sig   albuterol (PROAIR HFA) 108 (90 Base) MCG/ACT inhaler Inhale 1 puff into the lungs daily.    albuterol (PROVENTIL) (2.5 MG/3ML) 0.083% nebulizer solution Inhale 2.5 mg into the lungs 2 (two) times daily as needed.    alprazolam (XANAX) 2 MG tablet Take 1 tablet by mouth 3 (three) times daily.    amLODipine (NORVASC) 10 MG tablet Take 10 mg by mouth daily.   amphetamine-dextroamphetamine (ADDERALL) 30 MG  tablet Take 30 mg by mouth 3 (three) times daily.    aspirin EC 81 MG tablet Take 1 tablet (81 mg total) by mouth daily.   atorvastatin (LIPITOR) 80 MG tablet Take 1 tablet by mouth daily.   benazepril (LOTENSIN) 20 MG tablet Take 20 mg by mouth daily.    Canagliflozin (INVOKANA) 100 MG TABS Take 100 mg by mouth daily.   carvedilol (COREG) 25 MG tablet Take 25 mg by mouth 2 (two) times daily with a meal.    citalopram (CELEXA) 20 MG tablet Take 1 tablet by mouth daily.   cloNIDine (CATAPRES) 0.3 MG tablet Take 0.3 mg by mouth 2 (two) times daily.   D3 SUPER STRENGTH 2000 units CAPS Take 1 capsule by mouth daily.   DULoxetine (CYMBALTA) 30 MG capsule 2 tabs daily   fluticasone (FLONASE) 50 MCG/ACT nasal spray Place 50 sprays into both nostrils 2 (two) times daily.   Fluticasone-Salmeterol (ADVAIR DISKUS) 500-50 MCG/DOSE AEPB Inhale 1 puff into the lungs every 12 (twelve) hours.     gabapentin (NEURONTIN) 300 MG capsule TAKE ONE CAPSULE BY MOUTH THREE TIMES DAILY. (TAKE WITH 600MG TABLET TO TOTAL 900MG)   gabapentin (NEURONTIN) 600 MG tablet Take 600 mg by mouth 3 (three) times daily.   insulin aspart (NOVOLOG FLEXPEN) 100 UNIT/ML FlexPen Inject 0-26 Units into the skin 3 (three) times daily with meals. 20 units three times a day with sliding scale max of 26 units   Insulin Pen Needle (LITETOUCH PEN NEEDLES) 29G X 12.7MM MISC Use as directed.   Insulin Syringe-Needle U-100 (B-D INS SYR ULTRAFINE 1CC/31G) 31G X 5/16" 1 ML MISC    Insulin Syringe-Needle U-100 (B-D INS SYRINGE 0.5CC/31GX5/16) 31G X 5/16" 0.5 ML MISC    LEVEMIR FLEXTOUCH 100 UNIT/ML Pen INJECT 20 UNITS EVERY MORNING.   Magnesium Oxide 500 MG CAPS Take 1 capsule (500 mg total) by mouth 2 (two) times daily at 8 am and 10 pm.   meloxicam (MOBIC) 15 MG tablet Take 15 mg by mouth 2 (two) times daily.   metFORMIN (GLUCOPHAGE) 1000 MG tablet Take 1,000 mg by mouth 2 (two) times daily.    naloxone [facility]) 2 MG/2ML  injection Inject content of syringe into thigh muscle. Call 911.   naloxone HCl 4 MG/0.1ML LIQD One spray in either nostril once for known/suspected opioid overdose. May repeat every 2-3 minutes in alternating nostril til EMS arrives  nitroGLYCERIN (NITROSTAT) 0.4 MG SL tablet Place 1 tablet (0.4 mg total) under the tongue every 5 (five) minutes as needed for chest pain.   pantoprazole (PROTONIX) 40 MG tablet 1 tablet Two (2) times a day.   sacubitril-valsartan (ENTRESTO) 97-103 MG Take 1 tablet by mouth 2 (two) times daily.   sildenafil (REVATIO) 20 MG tablet Take 20 mg by mouth See admin instructions.   SURE COMFORT LANCETS 30G MISC USE TO CHECK SUGAR THREE TIMES DAILY {AS DIRECTED]   tiotropium (SPIRIVA) 18 MCG inhalation capsule Place 18 mcg into inhaler and inhale daily.     TRUETRACK TEST test strip USE TO CHECK BLOOD SUGAR THREE TIMES DAILY   No current facility-administered medications on file prior to visit.    ROS  Constitutional: Denies any fever or chills Gastrointestinal: No reported hemesis, hematochezia, vomiting, or acute GI distress Musculoskeletal: Denies any acute onset joint swelling, redness, loss of ROM, or weakness Neurological: No reported episodes of acute onset apraxia, aphasia, dysarthria, agnosia, amnesia, paralysis, loss of coordination, or loss of consciousness  Allergies  Mr. Kamps is allergic to lodine [etodolac]; tylenol [acetaminophen]; iodine; and propoxyphene n-acetaminophen.  [place]  Drug: Mr. Heslin  reports that he does not use drugs. Alcohol:  reports that he does not drink alcohol. Tobacco:  reports that he quit smoking about 27 years ago. His smoking use included Cigarettes. He started smoking about 49 years ago. He has a 28.50 pack-year smoking history. He has never used smokeless tobacco. Medical:  has a past medical history of Anxiety; Cellulitis (2011); Chronic back pain; Claustrophobia; COPD (chronic obstructive pulmonary disease)  ([place]); Diabetes mellitus, type 2 ([place]); Diverticulitis of colon (2008); GERD (gastroesophageal reflux disease); Heart attack ([place]); Hyperlipidemia; Hypertension; Obesity; and Pulmonary nodule. Family: family history includes Cancer in his mother; Coronary artery disease in his mother and sister; Diabetes in his sister; Heart attack in his brother and father.  Past Surgical History:  Procedure Laterality Date   APPENDECTOMY  2007   gangrenous appendicitis    CORONARY ARTERY BYPASS GRAFT N/A 02/06/2013   Procedure: CORONARY ARTERY BYPASS GRAFTING (CABG) with TEE;  Surgeon: [name], MD;  Location: [facility];  Service: Open Heart Surgery;  Laterality: N/A;  CABG x three,  using left internal mammary artery and right leg greater saphenous vein harvested endoscopically   LEFT HEART CATHETERIZATION WITH CORONARY ANGIOGRAM N/A 02/03/2013   Procedure: LEFT HEART CATHETERIZATION WITH CORONARY ANGIOGRAM;  Surgeon: Jettie Booze, MD;  Location[facility] C[facility] LAB;  Service: Cardiovascular;  Laterality: N/A;   TONSILLECTOMY     Constitutional Exam  General appearance: Well nourished, well developed, and well hydrated. In no apparent acute distress Vitals:   06/04/16 0911  BP: (!) 147/83  Pulse: 92  Resp: 16  Temp: 97.8 F (36.6 C)  SpO2: 93%  Weight: 243 lb (110.2 kg)  Height: '5\' 9"'  (1.753 m)   BMI Assessment: Estimated body mass index is 35.88 kg/m as calculated from the following:   Height as of this encounter: '5\' 9"'  (1.753 m).   Weight as of this encounter: 243 lb (110.2 kg).  BMI interpretation table: BMI level Category Range association with higher incidence of chronic pain  <18 kg/m2 Underweight   18.5-24.9 kg/m2 Ideal body weight   25-29.9 kg/m2 Overweight Increased incidence by 20%  30-34.9 kg/m2 Obese (Class I) Increased incidence by 68%  35-39.9 kg/m2 Severe obesity (Class II) Increased incidence by 136%  >40 kg/m2 Extreme obesity (Class III) Increased incidence by 254%  BMI  Readings from Last 4 Encounters:  06/04/16 35.88 kg/m  05/11/16 35.22 kg/m  03/30/16 35.95 kg/m  03/26/16 35.74 kg/m   Wt Readings from Last 4 Encounters:  06/04/16 243 lb (110.2 kg)  05/11/16 242 lb (109.8 kg)  03/30/16 247 lb (112 kg)  03/26/16 242 lb (109.8 kg)  Psych/Mental status: Alert, oriented x 3 (person, place, & time)       Eyes: PERLA Respiratory: No evidence of acute respiratory distress  Cervical Spine Exam  Inspection: No masses, redness, or swelling Alignment: Symmetrical Functional ROM: Unrestricted ROM      Stability: No instability detected Muscle strength & Tone: Functionally intact Sensory: Unimpaired Palpation: No palpable anomalies              Upper Extremity (UE) Exam    Side: Right upper extremity  Side: Left upper extremity  Inspection: No masses, redness, swelling, or asymmetry. No contractures  Inspection: No masses, redness, swelling, or asymmetry. No contractures  Functional ROM: Unrestricted ROM          Functional ROM: Unrestricted ROM          Muscle strength & Tone: Functionally intact  Muscle strength & Tone: Functionally intact  Sensory: Unimpaired  Sensory: Unimpaired  Palpation: No palpable anomalies              Palpation: No palpable anomalies              Specialized Test(s): Deferred         Specialized Test(s): Deferred          Thoracic Spine Exam  Inspection: No masses, redness, or swelling Alignment: Symmetrical Functional ROM: Unrestricted ROM Stability: No instability detected Sensory: Unimpaired Muscle strength & Tone: No palpable anomalies  Lumbar Spine Exam  Inspection: No masses, redness, or swelling Alignment: Symmetrical Functional ROM: Decreased ROM      Stability: No instability detected Muscle strength & Tone: Functionally intact Sensory: Movement-associated pain Palpation: Complains of area being tender to palpation       Provocative Tests: Lumbar Hyperextension and rotation test: Positive bilaterally for  facet joint pain. Patrick's Maneuver: Positive for bilateral S-I arthralgia and for left hip arthralgia  Gait & Posture Assessment  Ambulation: Limited Gait: Antalgic Posture: Poor   Lower Extremity Exam    Side: Right lower extremity  Side: Left lower extremity  Inspection: No masses, redness, swelling, or asymmetry. No contractures  Inspection: No masses, redness, swelling, or asymmetry. No contractures  Functional ROM: Unrestricted ROM          Functional ROM: Decreased ROM for hip and knee joints  Muscle strength & Tone: Functionally intact  Muscle strength & Tone: Functionally intact  Sensory: Unimpaired  Sensory: Movement-associated discomfort  Palpation: No palpable anomalies  Palpation: No palpable anomalies   Assessment  Primary Diagnosis & Pertinent Problem List: The primary encounter diagnosis was Chronic low back pain (Location of Secondary source of pain) (Bilateral) (L>R). Diagnoses of 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) (L>R), Lumbar facet syndrome (Location of Secondary source of pain) (Bilateral) (L>R), Lumbar facet arthropathy (HCC), Spondylosis of lumbar spine, Chronic knee pain (Location of Primary Source of Pain) (Bilateral) (L>R), Chronic pain syndrome, Long term current use of opiate analgesic, Opiate use (90 MME/Day), Vitamin D insufficiency, Type 2 diabetes mellitus with hyperglycemia, with long-term current use of insulin ([place]), Disturbance of skin sensation, Hypomagnesemia, and Acute postoperative pain were also pertinent to this visit.  Status Diagnosis  Persistent  Persistent Persistent 1. Chronic low back pain (Location of Secondary source of pain) (Bilateral) (L>R)   2. 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) (L>R)   3. Lumbar facet syndrome (Location of Secondary source of pain) (Bilateral) (L>R)   4. Lumbar facet arthropathy (HCC)   5. Spondylosis of lumbar spine   6.  Chronic knee pain (Location of Primary Source of Pain) (Bilateral) (L>R)   7. Chronic pain syndrome   8. Long term current use of opiate analgesic   9. Opiate use (90 MME/Day)   10. Vitamin D insufficiency   11. Type 2 diabetes mellitus with hyperglycemia, with long-term current use of insulin ([place])   12. Disturbance of skin sensation   13. Hypomagnesemia   14. Acute postoperative pain     Problems updated and reviewed during this visit: No problems updated. Plan of Care  Pharmacotherapy (Medications Ordered): Meds ordered this encounter  Medications   oxyCODONE (ROXICODONE) 15 MG immediate release tablet    Sig: Take 1 tablet (15 mg total) by mouth every 6 (six) hours as needed.    Dispense:  120 tablet    Refill:  0    Do not add this medication to the electronic "Automatic Refill" notification system. Patient may have prescription filled one day early if pharmacy is closed on scheduled refill date. Do not fill until: 09/06/16 To last until: 10/06/16   oxyCODONE (ROXICODONE) 15 MG immediate release tablet    Sig: Take 1 tablet (15 mg total) by mouth every 6 (six) hours as needed for pain.    Dispense:  120 tablet    Refill:  0    Do not add this medication to the electronic "Automatic Refill" notification system. Patient may have prescription filled one day early if pharmacy is closed on scheduled refill date. Do not fill until: 07/08/16 To last until: 08/07/16   oxyCODONE (ROXICODONE) 15 MG immediate release tablet    Sig: Take 1 tablet (15 mg total) by mouth every 6 (six) hours as needed for pain.    Dispense:  120 tablet    Refill:  0    Do not add this medication to the electronic "Automatic Refill" notification system. Patient may have prescription filled one day early if pharmacy is closed on scheduled refill date. Do not fill until: 08/07/16 To last until: 09/06/16   New Prescriptions   No medications on file   Medications administered today: Mr. Hennick had no  medications administered during this visit. Lab-work, procedure(s), and/or referral(s): Orders Placed This Encounter  Procedures   Radiofrequency,Lumbar   ToxASSURE Select 13 (MW), Urine   Comprehensive metabolic panel   C-reactive protein   Sedimentation rate   Vitamin B12   25-Hydroxyvitamin D Lcms D2+D3   Magnesium   Imaging and/or referral(s): None  Interventional therapies: Planned, scheduled, and/or pending:   Therapeutic Right-sided lumbar facet + sacroiliac joint RFA    Considering:   Diagnostic bilateralintra-articular knee injection Diagnostic bilateral genicular nerve block Possible bilateral genicular nerve radiofrequency ablation Diagnostic bilateral lumbar facet block Possible bilaterallumbar facet radiofrequency ablation (Left side done on 03/30/2016) Diagnostic left-sided L4-5 lumbar epidural steroid injection Diagnostic bilateral sacroiliac joint block Possible bilateral sacroiliac joint radiofrequency ablation   Palliative PRN treatment(s):   Palliative bilateral intra-articular knee injection Palliative bilateral genicular nerve block Palliative bilateral lumbar facet block Palliative left-sided L4-5 lumbar epidural steroid injection Palliative bilateral sacroiliac joint block   Provider-requested follow-up: Return in about 4 months (around 10/01/2016) for  Med-Mgmt, w/ MD, in addition, RFA, (ASAP), by MD.  Future Appointments Date Time Provider [place]  06/15/2016 10:15 AM Milinda Pointer, MD [facility] None  09/29/2016 1:00 PM Milinda Pointer, MD [facility] None   Primary Care Physician: [facility] Location: [facility] Note by: Kathlen Brunswick. Dossie Arbour, M.D, DABA, DABAPM, DABPM, DABIPP, FIPP Date: 06/04/2016; Time: 1:51 PM  Patient instructions provided during this appointment: Patient Instructions    ____________________________________________________________________________________________  Preparing for Procedure with Sedation Instructions:  Oral Intake: Do not eat or drink anything for at least 8 hours prior to your procedure.  Transportation: Public transportation is not allowed. Bring an adult driver. The driver must be physically present in our waiting room before any procedure can be started.  Physical Assistance: Bring an adult physically capable of assisting you, in the event you need help. This adult should keep you company at home for at least 6 hours after the procedure.  Blood Pressure Medicine: Take your blood pressure medicine with a sip of water the morning of the procedure.  Blood thinners:   Diabetics on insulin: Notify the staff so that you can be scheduled 1st case in the morning. If your diabetes requires high dose insulin, take only  of your normal insulin dose the morning of the procedure and notify the staff that you have done so.  Preventing infections: Shower with an antibacterial soap the morning of your procedure.  Build-up your immune system: Take 1000 mg of Vitamin C with every meal (3 times a day) the day prior to your procedure.  Antibiotics: Inform the staff if you have a condition or reason that requires you to take antibiotics before dental procedures.  Pregnancy: If you are pregnant, call and cancel the procedure.  Sickness: If you have a cold, fever, or any active infections, call and cancel the procedure.  Arrival: You must be in the facility at least 30 minutes prior to your scheduled procedure.  Children: Do not bring children with you.  Dress appropriately: Bring dark clothing that you would not mind if they get stained.  Valuables: Do not bring any jewelry or valuables. Procedure appointments are reserved for interventional treatments only.  No Prescription Refills.  No medication changes will be discussed during procedure  appointments.  No disability issues will be discussed. ______________________________________________________________________________________________  ____________________________________________________________________________________________  Appointment Policy  It is our goal and responsibility to provide the medical community with assistance in the evaluation and management of patients with chronic pain. Unfortunately our resources are limited. Because we do not have an unlimited amount of time, or available appointments, we are required to closely monitor and manage their use. The following rules exist to maximize their use:  Patient's responsibilities: 1. Punctuality: You are required to be physically present and registered in our facility at least 30 minutes before your appointment. 2. Tardiness: The cutoff is your appointment time. If you have an appointment scheduled for 10:00 AM and you arrive at 10:01, you will be required to reschedule your appointment.  3. Plan ahead: Always assume that you will encounter traffic on your way in. Plan for it. If you are dependent on a driver, make sure they understand these rules and the need to arrive early. 4. Other appointments and responsibilities: Avoid scheduling any other appointments before or after your pain clinic appointments.  5. Be prepared: Write down everything that you need to discuss with your healthcare provider and give this information to the admitting nurse. Write  down the medications that you will need refilled. Bring your pills and bottles (even the empty ones), to all of your appointments, except for those where a procedure is scheduled. 6. No children or pets: Find someone to take care of them. It is not appropriate to bring them in. 7. Scheduling changes: We request "advanced notification" of any changes or cancellations. 8. Advanced notification: Defined as a time period of more than 24 hours prior to the originally scheduled  appointment. This allows for the appointment to be offered to other patients. 9. Rescheduling: When a visit is rescheduled, it will require the cancellation of the original appointment. For this reason they both fall within the category of "Cancellations".  10. Cancellations: They require advanced notification. Any cancellation less than 24 hours before the  appointment will be recorded as a "No Show". 11. No Show: Defined as an unkept appointment where the patient failed to notify or declare to the practice their intention or inability to keep the appointment.  Corrective process for repeat offenders:  1. Tardiness: Three (3) episodes of rescheduling due to late arrivals will be recorded as one (1) "No Show". 2. Cancellation or reschedule: Three (3) cancellations or rescheduling will be recorded as one (1) "No Show". 3. "No Shows": Three (3) "No Shows" within a 12 month period will result in discharge from the practice.  ____________________________________________________________________________________________  ____________________________________________________________________________________________  Medication Rules  Applies to: All patients receiving prescriptions (written or electronic).  Pharmacy of record: Pharmacy where electronic prescriptions will be sent. If written prescriptions are taken to a different pharmacy, please inform the nursing staff. The pharmacy listed in the electronic medical record should be the one where you would like electronic prescriptions to be sent.  Prescription refills: Only during scheduled appointments. Applies to both, written and electronic prescriptions.  NOTE: The following applies primarily to controlled substances (Opioid Pain Medications)  Patient's responsibilities: 12. Pain Pills: Bring all pain pills to every appointment (except for procedure appointments). 13. Pill Bottles: Bring pills in original pharmacy bottle. Always bring newest  bottle. Bring bottle, even if empty. 14. Medication refills: You are responsible for knowing and keeping track of what medications you need refilled. The day before your appointment, write a list of all prescriptions that need to be refilled. Bring that list to your appointment and give it to the admitting nurse. Prescriptions will be written only during appointments. If you forget a medication, it will not be "Called in", "Faxed", or "electronically sent". You will need to get another appointment to get these prescribed. 15. Prescription Accuracy: You are responsible for carefully inspecting your prescriptions before leaving our office. Have the discharge nurse carefully go over each prescription with you, before taking them home. Make sure that your name is accurately spelled, that your address is correct. Check the name and dose of your medication to make sure it is accurate. Check the number of pills, and the written instructions to make sure they are clear and accurate. Make sure that you are given enough medication to last until your next medication refill appointment. 16. Taking Medication: Take medication as prescribed. Never take more pills than instructed. Never take medication more frequently than prescribed. Taking less pills or less frequently is permitted and encouraged, when it comes to controlled substances (written prescriptions).  60. Inform other Doctors: Always inform, all of your healthcare providers, of all the medications you take. 18. Pain Medication from other Providers: You are not allowed to accept any additional pain medication from  any other Doctor or Healthcare provider. There are two exceptions to this rule. (see below) In the event that you require additional pain medication, you are responsible for notifying us, as stated below. 19. Medication Agreement: You are responsible for carefully reading and following our Medication Agreement. This must be signed before receiving any  prescriptions from our practice. Safely store a copy of your signed Agreement. Violations to the Agreement will result in no further prescriptions. (Additional copies of our Medication Agreement are available upon request.) 20. Laws, Rules, & Regulations: All patients are expected to follow all Federal and [employer], Rules, [employer]. Ignorance of the Laws does not constitute a valid excuse.  Exceptions: There are only two exceptions to the rule of not receiving pain medications from other Healthcare Providers. 4. Exception #1 (Emergencies): In the event of an emergency (i.e.: accident requiring emergency care), you are allowed to receive additional pain medication. However, you are responsible for: As soon as you are able, call our office [phone], at any time of the day or night, and leave a message stating your name, the date and nature of the emergency, and the name and dose of the medication prescribed. In the event that your call is answered by a member of our staff, make sure to document and save the date, time, and the name of the person that took your information.  5. Exception #2 (Planned Surgery): In the event that you are scheduled by another doctor or dentist to have any type of surgery or procedure, you are allowed (for a period no longer than 30 days), to receive additional pain medication, for the acute post-op pain. However, in this case, you are responsible for picking up a copy of our "Post-op Pain Management for Surgeons" handout, and giving it to your surgeon or dentist. This document is available at our office, and does not require an appointment to obtain it. Simply go to our office during business hours (Monday-Thursday from 8:00 AM to 4:00 PM) (Friday 8:00 AM to 12:00 Noon) or if you have a scheduled appointment with [place], prior to your surgery, and ask for it by name. In addition, you will need to provide [place] with your name, name of your surgeon, type of surgery, and  date of procedure or surgery.  _____________________________________________________________________________________________  ____________________________________________________________________________________________  DRUG HOLIDAYS  Definitions Tolerance: defined as the progressively decreased responsiveness to a drug. Occurs when the drug is used repeatedly and the body adapts to the continued presence of the drug. As a result, a larger dose of the drug is needed to achieve the effect originally obtained by a smaller dose. It is thought to be due to the formation of excess opioid receptors.  Drug Holiday: is when a patient stops taking a medication(s) for a period of time; anywhere from a few days to several weeks.  Withdrawals: refers to the wide range of symptoms that occur after stopping or dramatically reducing opiate drugs after heavy and prolonged use. Withdrawal symptoms do not occur to patients that use low dose opioids, or those who take the medication sporadically. Contrary to benzodiazepine (example: Valium, Xanax, etc.) or alcohol withdrawals (Delirium Tremens), opioid withdrawals are not lethal. Withdrawals are the physical manifestation of the body getting rid of the excess receptors.  Purpose To eliminate tolerance.  Duration of Holiday 14 consecutive days. (2 weeks)  Expected Symptoms Early symptoms of withdrawal include:  Agitation  Anxiety  Muscle aches  Increased tearing  Insomnia  Runny nose  Sweating  Yawning  Late symptoms of withdrawal include:  Abdominal cramping  Diarrhea  Dilated pupils  Goose bumps  Nausea  Vomiting  Opioid withdrawal reactions are very uncomfortable but are not life-threatening. Symptoms usually start within 12 hours of last opioid dose and within 30 hours of last methadone exposure.  Duration of Symptoms 48 to 72 hours for short acting medications and 2 to 14 days for methadone.  Treatment  Clonidine  (Catapres) or tizanidine (Zanaflex) for agitation, sweating, tearing, runny nose.  Promethazine (Phenergan) for nausea, vomiting.  NSAIDs for pain.  Benefits  Improved effectiveness of opioids.  Decreased opioid dose needed to achieve benefits.  Improved pain with lesser dose. ____________________________________________________________________________________________  Radiofrequency Lesioning Radiofrequency lesioning is a procedure that is performed to relieve pain. The procedure is often used for back, neck, or arm pain. Radiofrequency lesioning involves the use of a machine that creates radio waves to make heat. During the procedure, the heat is applied to the nerve that carries the pain signal. The heat damages the nerve and interferes with the pain signal. Pain relief usually starts about 2 weeks after the procedure and lasts for 6 months to 1 year. Tell a health care provider about:  Any allergies you have.  All medicines you are taking, including vitamins, herbs, eye drops, creams, and over-the-counter medicines.  Any problems you or family members have had with anesthetic medicines.  Any blood disorders you have.  Any surgeries you have had.  Any medical conditions you have.  Whether you are pregnant or may be pregnant. What are the risks? Generally, this is a safe procedure. However, problems may occur, including:  Pain or soreness at the injection site.  Infection at the injection site.  Damage to nerves or blood vessels. What happens before the procedure?  Ask your health care provider about:  Changing or stopping your regular medicines. This is especially important if you are taking diabetes medicines or blood thinners.  Taking medicines such as aspirin and ibuprofen. These medicines can thin your blood. Do not take these medicines before your procedure if your health care provider instructs you not to.  Follow instructions from your health care provider  about eating or drinking restrictions.  Plan to have someone take you home after the procedure.  If you go home right after the procedure, plan to have someone with you for 24 hours. What happens during the procedure?  You will be given one or more of the following:  A medicine to help you relax (sedative).  A medicine to numb the area (local anesthetic).  You will be awake during the procedure. You will need to be able to talk with the health care provider during the procedure.  With the help of a type of X-ray (fluoroscopy), the health care provider will insert a radiofrequency needle into the area to be treated.  Next, a wire that carries the radio waves (electrode) will be put through the radiofrequency needle. An electrical pulse will be sent through the electrode to verify the correct nerve. You will feel a tingling sensation, and you may have muscle twitching.  Then, the tissue that is around the needle tip will be heated by an electric current that is passed using the radiofrequency machine. This will numb the nerves.  A bandage (dressing) will be put on the insertion area after the procedure is done. The procedure may vary among health care providers and hospitals. What happens after the procedure?  Your blood pressure, heart  rate, breathing rate, and blood oxygen level will be monitored often until the medicines you were given have worn off.  Return to your normal activities as directed by your health care provider. This information is not intended to replace advice given to you by your health care provider. Make sure you discuss any questions you have with your health care provider. Document Released: 08/27/2010 Document Revised: 06/06/2015 Document Reviewed: 02/05/2014 Elsevier Interactive Patient Education  2017 [place] Radiofrequency Lesioning, Care After Refer to this sheet in the next few weeks. These instructions provide you with information about caring for  yourself after your procedure. Your health care provider may also give you more specific instructions. Your treatment has been planned according to current medical practices, but problems sometimes occur. Call your health care provider if you have any problems or questions after your procedure. What can I expect after the procedure? After the procedure, it is common to have:  Pain from the burned nerve.  Temporary numbness. Follow these instructions at home:  Take over-the-counter and prescription medicines only as told by your health care provider.  Return to your normal activities as told by your health care provider. Ask your health care provider what activities are safe for you.  Pay close attention to how you feel after the procedure. If you start to have pain, write down when it hurts and how it feels. This will help you and your health care provider to know if you need an additional treatment.  Check your needle insertion site every day for signs of infection. Watch for:  Redness, swelling, or pain.  Fluid, blood, or pus.  Keep all follow-up visits as told by your health care provider. This is important. Contact a health care provider if:  Your pain does not get better.  You have redness, swelling, or pain at the needle insertion site.  You have fluid, blood, or pus coming from the needle insertion site.  You have a fever. Get help right away if:  You develop sudden, severe pain.  You develop numbness or tingling near the procedure site that does not go away. This information is not intended to replace advice given to you by your health care provider. Make sure you discuss any questions you have with your health care provider. Document Released: 08/28/2010 Document Revised: 06/06/2015 Document Reviewed: 02/05/2014 Elsevier Interactive Patient Education  2017 [place]. Pain Management Discharge Instructions  General Discharge Instructions :  If you need to reach  your doctor call: Monday-Friday 8:00 am - 4:00 pm at [phone] or toll free [phone].  After clinic hours [phone] to have operator reach doctor.  Bring all of your medication bottles to all your appointments in the pain clinic.  To cancel or reschedule your appointment with Pain Management please remember to call 24 hours in advance to avoid a fee.  Refer to the educational materials which you have been given on: General Risks, I had my Procedure. Discharge Instructions, Post Sedation.  Post Procedure Instructions:  The drugs you were given will stay in your system until tomorrow, so for the next 24 hours you should not drive, make any legal decisions or drink any alcoholic beverages.  You may eat anything you prefer, but it is better to start with liquids then soups and crackers, and gradually work up to solid foods.  Please notify your doctor immediately if you have any unusual bleeding, trouble breathing or pain that is not related to your normal pain.  Depending on the  type of procedure that was done, some parts of your body may feel week and/or numb.  This usually clears up by tonight or the next day.  Walk with the use of an assistive device or accompanied by an adult for the 24 hours.  You may use ice on the affected area for the first 24 hours.  Put ice in a Ziploc bag and cover with a towel and place against area 15 minutes on 15 minutes off.  You may switch to heat after 24 hours.GENERAL RISKS AND COMPLICATIONS  What are the risk, side effects and possible complications? Generally speaking, most procedures are safe.  However, with any procedure there are risks, side effects, and the possibility of complications.  The risks and complications are dependent upon the sites that are lesioned, or the type of nerve block to be performed.  The closer the procedure is to the spine, the more serious the risks are.  Great care is taken when placing the radio frequency needles,  block needles or lesioning probes, but sometimes complications can occur. 1. Infection: Any time there is an injection through the skin, there is a risk of infection.  This is why sterile conditions are used for these blocks.  There are four possible types of infection. 1. Localized skin infection. 2. Central Nervous System Infection-This can be in the form of Meningitis, which can be deadly. 3. Epidural Infections-This can be in the form of an epidural abscess, which can cause pressure inside of the spine, causing compression of the spinal cord with subsequent paralysis. This would require an emergency surgery to decompress, and there are no guarantees that the patient would recover from the paralysis. 4. Discitis-This is an infection of the intervertebral discs.  It occurs in about 1% of discography procedures.  It is difficult to treat and it may lead to surgery.        2. Pain: the needles have to go through skin and soft tissues, will cause soreness.       3. Damage to internal structures:  The nerves to be lesioned may be near blood vessels or    other nerves which can be potentially damaged.       4. Bleeding: Bleeding is more common if the patient is taking blood thinners such as  aspirin, Coumadin, Ticiid, Plavix, etc., or if he/she have some genetic predisposition  such as hemophilia. Bleeding into the spinal canal can cause compression of the spinal  cord with subsequent paralysis.  This would require an emergency surgery to  decompress and there are no guarantees that the patient would recover from the  paralysis.       5. Pneumothorax:  Puncturing of a lung is a possibility, every time a needle is introduced in  the area of the chest or upper back.  Pneumothorax refers to free air around the  collapsed lung(s), inside of the thoracic cavity (chest cavity).  Another two possible  complications related to a similar event would include: Hemothorax and Chylothorax.   These are variations of the  Pneumothorax, where instead of air around the collapsed  lung(s), you may have blood or chyle, respectively.       6. Spinal headaches: They may occur with any procedures in the area of the spine.       7. Persistent CSF (Cerebro-Spinal Fluid) leakage: This is a rare problem, but may occur  with prolonged intrathecal or epidural catheters either due to the formation of a fistulous  track or a dural tear.       8. Nerve damage: By working so close to the spinal cord, there is always a possibility of  nerve damage, which could be as serious as a permanent spinal cord injury with  paralysis.       9. Death:  Although rare, severe deadly allergic reactions known as "Anaphylactic  reaction" can occur to any of the medications used.      10. Worsening of the symptoms:  We can always make thing worse.  What are the chances of something like this happening? Chances of any of this occuring are extremely low.  By statistics, you have more of a chance of getting killed in a motor vehicle accident: while driving to the hospital than any of the above occurring .  Nevertheless, you should be aware that they are possibilities.  In general, it is similar to taking a shower.  Everybody knows that you can slip, hit your head and get killed.  Does that mean that you should not shower again?  Nevertheless always keep in mind that statistics do not mean anything if you happen to be on the wrong side of them.  Even if a procedure has a 1 (one) in a 1,000,000 (million) chance of going wrong, it you happen to be that one..Also, keep in mind that by statistics, you have more of a chance of having something go wrong when taking medications.  Who should not have this procedure? If you are on a blood thinning medication (e.g. Coumadin, Plavix, see list of "Blood Thinners"), or if you have an active infection going on, you should not have the procedure.  If you are taking any blood thinners, please inform your physician.  How  should I prepare for this procedure?  Do not eat or drink anything at least six hours prior to the procedure.  Bring a driver with you .  It cannot be a taxi.  Come accompanied by an adult that can drive you back, and that is strong enough to help you if your legs get weak or numb from the local anesthetic.  Take all of your medicines the morning of the procedure with just enough water to swallow them.  If you have diabetes, make sure that you are scheduled to have your procedure done first thing in the morning, whenever possible.  If you have diabetes, take only half of your insulin dose and notify our nurse that you have done so as soon as you arrive at the clinic.  If you are diabetic, but only take blood sugar pills (oral hypoglycemic), then do not take them on the morning of your procedure.  You may take them after you have had the procedure.  Do not take aspirin or any aspirin-containing medications, at least eleven (11) days prior to the procedure.  They may prolong bleeding.  Wear loose fitting clothing that may be easy to take off and that you would not mind if it got stained with Betadine or blood.  Do not wear any jewelry or perfume  Remove any nail coloring.  It will interfere with some of our monitoring equipment.  NOTE: Remember that this is not meant to be interpreted as a complete list of all possible complications.  Unforeseen problems may occur.  BLOOD THINNERS The following drugs contain aspirin or other products, which can cause increased bleeding during surgery and should not be taken for 2 weeks prior to and 1 week after surgery.  If you should need take something for relief of minor pain, you may take acetaminophen which is found in Tylenol,m Datril, Anacin-3 and Panadol. It is not blood thinner. The products listed below are.  Do not take any of the products listed below in addition to any listed on your instruction sheet.  A.P.C or A.P.C with Codeine Codeine  Phosphate Capsules #3 Ibuprofen Ridaura  ABC compound Congesprin Imuran rimadil  Advil Cope Indocin Robaxisal  Alka-Seltzer Effervescent Pain Reliever and Antacid Coricidin or Coricidin-D  Indomethacin Rufen  Alka-Seltzer plus Cold Medicine Cosprin Ketoprofen S-A-C Tablets  Anacin Analgesic Tablets or Capsules Coumadin Korlgesic Salflex  Anacin Extra Strength Analgesic tablets or capsules CP-2 Tablets Lanoril Salicylate  Anaprox Cuprimine Capsules Levenox Salocol  Anexsia-D Dalteparin Magan Salsalate  Anodynos Darvon compound Magnesium Salicylate Sine-off  Ansaid Dasin Capsules Magsal Sodium Salicylate  Anturane Depen Capsules Marnal Soma  APF Arthritis pain formula Dewitt's Pills Measurin Stanback  Argesic Dia-Gesic Meclofenamic Sulfinpyrazone  Arthritis Bayer Timed Release Aspirin Diclofenac Meclomen Sulindac  Arthritis pain formula Anacin Dicumarol Medipren Supac  Analgesic (Safety coated) Arthralgen Diffunasal Mefanamic Suprofen  Arthritis Strength Bufferin Dihydrocodeine Mepro Compound Suprol  Arthropan liquid Dopirydamole Methcarbomol with Aspirin Synalgos  ASA tablets/Enseals Disalcid Micrainin Tagament  Ascriptin Doan's Midol Talwin  Ascriptin A/D Dolene Mobidin Tanderil  Ascriptin Extra Strength Dolobid Moblgesic Ticlid  Ascriptin with Codeine Doloprin or Doloprin with Codeine Momentum Tolectin  Asperbuf Duoprin Mono-gesic Trendar  Aspergum Duradyne Motrin or Motrin IB Triminicin  Aspirin plain, buffered or enteric coated Durasal Myochrisine Trigesic  Aspirin Suppositories Easprin Nalfon Trillsate  Aspirin with Codeine Ecotrin Regular or Extra Strength Naprosyn Uracel  Atromid-S Efficin Naproxen Ursinus  Auranofin Capsules Elmiron Neocylate Vanquish  Axotal Emagrin Norgesic Verin  Azathioprine Empirin or Empirin with Codeine Normiflo Vitamin E  Azolid Emprazil Nuprin Voltaren  Bayer Aspirin plain, buffered or children's or timed BC Tablets or powders Encaprin Orgaran  Warfarin Sodium  Buff-a-Comp Enoxaparin Orudis Zorpin  Buff-a-Comp with Codeine Equegesic Os-Cal-Gesic   Buffaprin Excedrin plain, buffered or Extra Strength Oxalid   Bufferin Arthritis Strength Feldene Oxphenbutazone   Bufferin plain or Extra Strength Feldene Capsules Oxycodone with Aspirin   Bufferin with Codeine Fenoprofen Fenoprofen Pabalate or Pabalate-SF   Buffets II Flogesic Panagesic   Buffinol plain or Extra Strength Florinal or Florinal with Codeine Panwarfarin   Buf-Tabs Flurbiprofen Penicillamine   Butalbital Compound Four-way cold tablets Penicillin   Butazolidin Fragmin Pepto-Bismol   Carbenicillin Geminisyn Percodan   Carna Arthritis Reliever Geopen Persantine   Carprofen Gold's salt Persistin   Chloramphenicol Goody's Phenylbutazone   Chloromycetin Haltrain Piroxlcam   Clmetidine heparin Plaquenil   Cllnoril Hyco-pap Ponstel   Clofibrate Hydroxy chloroquine Propoxyphen         Before stopping any of these medications, be sure to consult the physician who ordered them.  Some, such as Coumadin (Warfarin) are ordered to prevent or treat serious conditions such as "deep thrombosis", "pumonary embolisms", and other heart problems.  The amount of time that you may need off of the medication may also vary with the medication and the reason for which you were taking it.  If you are taking any of these medications, please make sure you notify your pain physician before you undergo any procedures.          Facet Joint Block The facet joints connect the bones of the spine (vertebrae). They make it possible for you to bend, twist, and make other movements with your spine. They also keep you  from bending too far, twisting too far, and making other excessive movements. A facet joint block is a procedure where a numbing medicine (anesthetic) is injected into a facet joint. Often, a type of anti-inflammatory medicine called a steroid is also injected. A facet joint block may be done  to diagnose neck or back pain. If the pain gets better after a facet joint block, it means the pain is probably coming from the facet joint. If the pain does not get better, it means the pain is probably not coming from the facet joint. A facet joint block may also be done to relieve neck or back pain caused by an inflamed facet joint. A facet joint block is only done to relieve pain if the pain does not improve with other methods, such as medicine, exercise programs, and physical therapy. Tell a health care provider about:  Any allergies you have.  All medicines you are taking, including vitamins, herbs, eye drops, creams, and over-the-counter medicines.  Any problems you or family members have had with anesthetic medicines.  Any blood disorders you have.  Any surgeries you have had.  Any medical conditions you have.  Whether you are pregnant or may be pregnant. What are the risks? Generally, this is a safe procedure. However, problems may occur, including:  Bleeding.  Injury to a nerve near the injection site.  Pain at the injection site.  Weakness or numbness in areas controlled by nerves near the injection site.  Infection.  Temporary fluid retention.  Allergic reactions to medicines or dyes.  Injury to other structures or organs near the injection site. What happens before the procedure?  Follow instructions from your health care provider about eating or drinking restrictions.  Ask your health care provider about:  Changing or stopping your regular medicines. This is especially important if you are taking diabetes medicines or blood thinners.  Taking medicines such as aspirin and ibuprofen. These medicines can thin your blood. Do not take these medicines before your procedure if your health care provider instructs you not to.  Do not take any new dietary supplements or medicines without asking your health care provider first.  Plan to have someone take you home  after the procedure. What happens during the procedure?  You may need to remove your clothing and dress in an open-back gown.  The procedure will be done while you are lying on an X-ray table. You will most likely be asked to lie on your stomach, but you may be asked to lie in a different position if an injection will be made in your neck.  Machines will be used to monitor your oxygen levels, heart rate, and blood pressure.  If an injection will be made in your neck, an IV tube will be inserted into one of your veins. Fluids and medicine will flow directly into your body through the IV tube.  The area over the facet joint where the injection will be made will be cleaned with soap. The surrounding skin will be covered with clean drapes.  A numbing medicine (local anesthetic) will be applied to your skin. Your skin may sting or burn for a moment.  A video X-ray machine (fluoroscopy) will be used to locate the joint. In some cases, a CT scan may be used.  A contrast dye may be injected into the facet joint area to help locate the joint.  When the joint is located, an anesthetic will be injected into the joint through the needle.  Your health care provider will ask you whether you feel pain relief. If you do feel relief, a steroid may be injected to provide pain relief for a longer period of time. If you do not feel relief or feel only partial relief, additional injections of an anesthetic may be made in other facet joints.  The needle will be removed.  Your skin will be cleaned.  A bandage (dressing) will be applied over each injection site. The procedure may vary among health care providers and hospitals. What happens after the procedure?  You will be observed for 15-30 minutes before being allowed to go home. This information is not intended to replace advice given to you by your health care provider. Make sure you discuss any questions you have with your health care  provider. Document Released: 05/20/2006 Document Revised: 01/30/2015 Document Reviewed: 09/24/2014 Elsevier Interactive Patient Education  2017 [place] After Refer to this sheet in the next few weeks. These instructions provide you with information about caring for yourself after your procedure. Your health care provider may also give you more specific instructions. Your treatment has been planned according to current medical practices, but problems sometimes occur. Call your health care provider if you have any problems or questions after your procedure. What can I expect after the procedure? After the procedure, it is common to have:  Some tenderness over the injection sites for 2 days after the procedure.  A temporary increase in blood sugar if you have diabetes. Follow these instructions at home:  Keep track of the amount of pain relief you feel and how long it lasts.  Take over-the-counter and prescription medicines only as told by your health care provider. You may need to limit pain medicine within the first 4-6 hours after the procedure.  Remove your bandages (dressings) the morning after the procedure.  For the first 24 hours after the procedure:  Do not apply heat near or over the injection sites.  Do not take a bath or soak in water, such as in a pool or lake.  Do not drive or operate heavy machinery unless approved by your health care provider.  Avoid activities that require a lot of energy.  If the injection site is tender, try applying ice to the area. To do this:  Put ice in a plastic bag.  Place a towel between your skin and the bag.  Leave the ice on for 20 minutes, 2-3 times a day.  Keep all follow-up visits as told by your health care provider. This is important. Contact a health care provider if:  Fluid is coming from an injection site.  There is significant bleeding or swelling at an injection site.  You have diabetes  and your blood sugar is above 180 mg/dL. Get help right away if:  You have a fever.  You have worsening pain or swelling around an injection site.  There are red streaks around an injection site.  You develop severe pain that is not controlled by your medicines.  You develop a headache, stiff neck, nausea, or vomiting.  Your eyes become very sensitive to light.  You have weakness, paralysis, or tingling in your arms or legs that was not present before the procedure.  You have difficulty urinating or breathing. This information is not intended to replace advice given to you by your health care provider. Make sure you discuss any questions you have with your health care provider. Document Released: 12/16/2011 Document Revised:  05/15/2015 Document Reviewed: 09/24/2014 Elsevier Interactive Patient Education  2017 [employer].

## 2016-06-10 LAB — TOXASSURE SELECT 13 (MW), URINE

## 2016-06-11 LAB — COMPREHENSIVE METABOLIC PANEL
ALBUMIN: 4.3 g/dL (ref 3.5–5.5)
ALK PHOS: 63 IU/L (ref 39–117)
ALT: 26 IU/L (ref 0–44)
AST: 16 IU/L (ref 0–40)
Albumin/Globulin Ratio: 1.6 (ref 1.2–2.2)
BUN/Creatinine Ratio: 19 (ref 9–20)
BUN: 20 mg/dL (ref 6–24)
Bilirubin Total: 0.2 mg/dL (ref 0.0–1.2)
CALCIUM: 9.5 mg/dL (ref 8.7–10.2)
CO2: 24 mmol/L (ref 18–29)
CREATININE: 1.08 mg/dL (ref 0.76–1.27)
Chloride: 97 mmol/L (ref 96–106)
GFR calc Af Amer: 88 mL/min/{1.73_m2} (ref >59)
GFR, EST NON AFRICAN AMERICAN: 76 mL/min/{1.73_m2} (ref >59)
GLOBULIN, TOTAL: 2.7 g/dL (ref 1.5–4.5)
GLUCOSE: 342 mg/dL — AB (ref 65–99)
Potassium: 5.4 mmol/L — ABNORMAL HIGH (ref 3.5–5.2)
Sodium: 139 mmol/L (ref 134–144)
Total Protein: 7 g/dL (ref 6.0–8.5)

## 2016-06-11 LAB — 25-HYDROXYVITAMIN D LCMS D2+D3
25-HYDROXY, VITAMIN D-2: 1.9 ng/mL
25-HYDROXY, VITAMIN D: 31 ng/mL

## 2016-06-11 LAB — MAGNESIUM: MAGNESIUM: 1.7 mg/dL (ref 1.6–2.3)

## 2016-06-11 LAB — C-REACTIVE PROTEIN: CRP: 5.8 mg/L — ABNORMAL HIGH (ref 0.0–4.9)

## 2016-06-11 LAB — 25-HYDROXY VITAMIN D LCMS D2+D3: 25-Hydroxy, Vitamin D-3: 29 ng/mL

## 2016-06-11 LAB — SEDIMENTATION RATE: SED RATE: 2 mm/h (ref 0–30)

## 2016-06-11 LAB — VITAMIN B12: VITAMIN B 12: 473 pg/mL (ref 232–1245)

## 2016-06-15 ENCOUNTER — Encounter: Payer: Self-pay | Admitting: Pain Medicine

## 2016-06-15 ENCOUNTER — Ambulatory Visit
Admission: RE | Admit: 2016-06-15 | Discharge: 2016-06-15 | Disposition: A | Discharge status: Home / Self Care | Payer: Medicare Other | Source: Ambulatory Visit | Attending: Pain Medicine | Admitting: Pain Medicine

## 2016-06-15 ENCOUNTER — Ambulatory Visit (HOSPITAL_BASED_OUTPATIENT_CLINIC_OR_DEPARTMENT_OTHER): Payer: Medicare Other | Admitting: Pain Medicine

## 2016-06-15 VITALS — BP 141/70 | HR 64 | Temp 97.8°F | Resp 20 | Ht 69.0 in | Wt 239.0 lb

## 2016-06-15 DIAGNOSIS — Z888 Allergy status to other drugs, medicaments and biological substances status: Secondary | ICD-10-CM | POA: Insufficient documentation

## 2016-06-15 DIAGNOSIS — M47816 Spondylosis without myelopathy or radiculopathy, lumbar region: Secondary | ICD-10-CM

## 2016-06-15 DIAGNOSIS — M1288 Other specific arthropathies, not elsewhere classified, other specified site: Secondary | ICD-10-CM | POA: Diagnosis not present

## 2016-06-15 DIAGNOSIS — M533 Sacrococcygeal disorders, not elsewhere classified: Secondary | ICD-10-CM | POA: Insufficient documentation

## 2016-06-15 DIAGNOSIS — M545 Low back pain, unspecified: Secondary | ICD-10-CM

## 2016-06-15 DIAGNOSIS — Z951 Presence of aortocoronary bypass graft: Secondary | ICD-10-CM | POA: Diagnosis not present

## 2016-06-15 DIAGNOSIS — G8929 Other chronic pain: Secondary | ICD-10-CM

## 2016-06-15 DIAGNOSIS — M5386 Other specified dorsopathies, lumbar region: Secondary | ICD-10-CM

## 2016-06-15 DIAGNOSIS — J439 Emphysema, unspecified: Secondary | ICD-10-CM

## 2016-06-15 DIAGNOSIS — G8918 Other acute postprocedural pain: Secondary | ICD-10-CM | POA: Diagnosis not present

## 2016-06-15 DIAGNOSIS — M488X6 Other specified spondylopathies, lumbar region: Secondary | ICD-10-CM | POA: Insufficient documentation

## 2016-06-15 DIAGNOSIS — M4696 Unspecified inflammatory spondylopathy, lumbar region: Secondary | ICD-10-CM

## 2016-06-15 MED ORDER — LIDOCAINE HCL (PF) 1 % IJ SOLN
10.0000 mL | Freq: Once | INTRAMUSCULAR | Status: AC
  Administered 2016-06-15: 5 mL
  Filled 2016-06-15: qty 10

## 2016-06-15 MED ORDER — ROPIVACAINE HCL 2 MG/ML IJ SOLN
9.0000 mL | Freq: Once | INTRAMUSCULAR | Status: AC
  Administered 2016-06-15: 10 mL via PERINEURAL
  Filled 2016-06-15: qty 10

## 2016-06-15 MED ORDER — TRIAMCINOLONE ACETONIDE 40 MG/ML IJ SUSP
40.0000 mg | Freq: Once | INTRAMUSCULAR | Status: AC
  Administered 2016-06-15: 40 mg
  Filled 2016-06-15: qty 1

## 2016-06-15 MED ORDER — MIDAZOLAM HCL 5 MG/5ML IJ SOLN
1.0000 mg | INTRAMUSCULAR | Status: DC | PRN
  Administered 2016-06-15: 3 mg via INTRAVENOUS
  Filled 2016-06-15: qty 5

## 2016-06-15 MED ORDER — FENTANYL CITRATE (PF) 100 MCG/2ML IJ SOLN
25.0000 ug | INTRAMUSCULAR | Status: DC | PRN
  Administered 2016-06-15: 50 ug via INTRAVENOUS
  Filled 2016-06-15: qty 2

## 2016-06-15 MED ORDER — HYDROMORPHONE HCL 2 MG PO TABS: 2.0000 mg | ORAL_TABLET | Freq: Four times a day (QID) | ORAL | 0 refills | Status: DC | PRN

## 2016-06-15 MED ORDER — LACTATED RINGERS IV SOLN
1000.0000 mL | Freq: Once | INTRAVENOUS | Status: AC
  Administered 2016-06-15: 1000 mL via INTRAVENOUS

## 2016-06-15 NOTE — Progress Notes (Signed)
Safety precautions to be maintained throughout the outpatient stay will include: orient to surroundings, keep bed in low position, maintain call bell within reach at all times, provide assistance with transfer out of bed and ambulation.  

## 2016-06-15 NOTE — Progress Notes (Signed)
Patient's Name: Manuel Knight  MRN: [id number]  Referring Provider: [facility]  DOB: [birth year]  PCP: [facility]  DOS: 06/15/2016  Note by: Kathlen Brunswick. Dossie Arbour, MD  Service setting: Ambulatory outpatient  Location: [facility] (AMB) Pain Management Facility  Visit type: Procedure  Specialty: Interventional Pain Management  Patient type: Established   Primary Reason for Visit: Interventional Pain Management Treatment. CC: Back Pain (low)  Procedure:  Anesthesia, Analgesia, Anxiolysis:  Type: Therapeutic Medial Branch Facet & Sacroiliac joint Radiofrequency Ablation Region: Lumbosacral Level: L2, L3, L4, L5, S1, S2, & S3 Medial Branch Level(s) Laterality: Right-Sided  Type: Local Anesthesia with Moderate (Conscious) Sedation Local Anesthetic: Lidocaine 1% Route: Intravenous (IV) IV Access: Secured Sedation: Meaningful verbal contact was maintained at all times during the procedure  Indication(s): Analgesia and Anxiety  Indications: 1. Lumbar facet syndrome (Location of Secondary source of pain) (Bilateral) (L>R)   2. 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) (L>R)   3. Chronic low back pain (Location of Secondary source of pain) (Bilateral) (L>R)   4. Lumbar facet arthropathy (HCC)   5. Spondylosis of lumbar spine   6. Pulmonary emphysema, unspecified emphysema type ([place])   7. Acute postoperative pain    Mr. Younis has either failed to respond, was unable to tolerate, or simply did not get enough benefit from other more conservative therapies including, but not limited to: 1. Over-the-counter medications 2. Anti-inflammatory medications 3. Muscle relaxants 4. Membrane stabilizers 5. Opioids 6. Physical therapy 7. Modalities (Heat, ice, etc.) 8. Invasive techniques such as nerve blocks. Mr. Loh has attained more than 50% relief of the pain from a series of diagnostic injections conducted in separate  occasions.  Pain Score: Pre-procedure: 3 /10 Post-procedure: 1 /10  Pre-op Assessment:  Previous date of service: 06/04/16 Service provided: Med Refill Mr. Falletta is a 56 y.o. (year old), male patient, seen today for interventional treatment. 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 His primarily concern today is the Back Pain (low)  Initial Vital Signs: There were no vitals taken for this visit. BMI: 35.29 kg/m  Risk Assessment: Allergies: Reviewed. He is allergic to lodine [etodolac]; tylenol [acetaminophen]; iodine; and propoxyphene n-acetaminophen.  Allergy Precautions: None required Coagulopathies: Reviewed. None identified.  Blood-thinner therapy: None at this time Active Infection(s): Reviewed. None identified. Mr. Wordell is afebrile  Site Confirmation: Mr. Porzio was asked to confirm the procedure and laterality before marking the site Procedure checklist: Completed Consent: Before the procedure and under the influence of no sedative(s), amnesic(s), or anxiolytics, the patient was informed of the treatment options, risks and possible complications. To fulfill our ethical and legal obligations, as recommended by the American Medical Association's Code of Ethics, I have informed the patient of my clinical impression; the nature and purpose of the treatment or procedure; the risks, benefits, and possible complications of the intervention; the alternatives, including doing nothing; the risk(s) and benefit(s) of the alternative treatment(s) or procedure(s); and the risk(s) and benefit(s) of doing nothing. The patient was provided information about the general risks and possible complications associated with the procedure. These may include, but are not limited to: failure to achieve desired goals, infection, bleeding, organ or  nerve damage, allergic reactions, paralysis, and death. In addition, the patient was informed of those risks and complications associated to Spine-related procedures, such as failure to  decrease pain; infection (i.e.: Meningitis, epidural or intraspinal abscess); bleeding (i.e.: epidural hematoma, subarachnoid hemorrhage, or any other type of intraspinal or peri-dural bleeding); organ or nerve damage (i.e.: Any type of peripheral nerve, nerve root, or spinal cord injury) with subsequent damage to sensory, motor, and/or autonomic systems, resulting in permanent pain, numbness, and/or weakness of one or several areas of the body; allergic reactions; (i.e.: anaphylactic reaction); and/or death. Furthermore, the patient was informed of those risks and complications associated with the medications. These include, but are not limited to: allergic reactions (i.e.: anaphylactic or anaphylactoid reaction(s)); adrenal axis suppression; blood sugar elevation that in diabetics may result in ketoacidosis or comma; water retention that in patients with history of congestive heart failure may result in shortness of breath, pulmonary edema, and decompensation with resultant heart failure; weight gain; swelling or edema; medication-induced neural toxicity; particulate matter embolism and blood vessel occlusion with resultant organ, and/or nervous system infarction; and/or aseptic necrosis of one or more joints. Finally, the patient was informed that Medicine is not an exact science; therefore, there is also the possibility of unforeseen or unpredictable risks and/or possible complications that may result in a catastrophic outcome. The patient indicated having understood very clearly. We have given the patient no guarantees and we have made no promises. Enough time was given to the patient to ask questions, all of which were answered to the patient's satisfaction. Mr. Tisdel has indicated that he wanted to continue with the  procedure. Attestation: I, the ordering provider, attest that I have discussed with the patient the benefits, risks, side-effects, alternatives, likelihood of achieving goals, and potential problems during recovery for the procedure that I have provided informed consent. Date: 06/15/2016; Time: 10:54 AM  Pre-Procedure Preparation:  Monitoring: As per clinic protocol. Respiration, ETCO2, SpO2, BP, heart rate and rhythm monitor placed and checked for adequate function Safety Precautions: Patient was assessed for positional comfort and pressure points before starting the procedure. Time-out: I initiated and conducted the "Time-out" before starting the procedure, as per protocol. The patient was asked to participate by confirming the accuracy of the "Time Out" information. Verification of the correct person, site, and procedure were performed and confirmed by me, the nursing staff, and the patient. "Time-out" conducted as per Joint Commission's Universal Protocol (UP.01.01.01). "Time-out" Date & Time: 06/15/2016; 1247 hrs.  Description of Procedure Process:   Target Area: For Lumbar Facet blocks, the target is the groove formed by the junction of the transverse process and superior articular process. For the L5 dorsal ramus, the target is the notch between superior articular process and sacral ala. For the Sacral dorsal rami, the target is the superior and lateral edge of the posterior S1, S2, & S3 Sacral foramen. Approach: Paraspinal approach. Area Prepped: Entire Posterior Lumbosacral Region Prepping solution: Hibiclens (4.0% Chlorhexidine gluconate solution) Safety Precautions: Aspiration looking for blood return was conducted prior to all injections. At no point did we inject any substances, as a needle was being advanced. No attempts were made at seeking any paresthesias. Safe injection practices and needle disposal techniques used. Medications properly checked for expiration dates. SDV (single dose  vial) medications used. Description of the Procedure: Protocol guidelines were followed. The patient was placed in position over the fluoroscopy table. The target area was identified and the area prepped in the usual manner. Skin desensitized using vapocoolant spray. Skin & deeper tissues infiltrated with local anesthetic. Appropriate amount of time allowed to pass for local anesthetics to take effect. Radiofrequency needles were  introduced to the area of the medial branch at the junction of the superior articular process and transverse process using fluoroscopy. Using the [employer], sensory stimulation using 50 Hz was used to locate & identify the nerve, making sure that the needle was positioned such that there was no sensory stimulation below 0.3 V or above 0.7 V. Stimulation using 2 Hz was used to evaluate the motor component. Care was taken not to lesion any nerves that demonstrated motor stimulation of the lower extremities at an output of less than 2.5 times that of the sensory threshold, or a maximum of 2.0 V. Once satisfactory placement of the needles was achieved, the above solution was slowly injected after negative aspiration. After waiting for at least 2 minutes, the ablation was performed at 80 degrees C for 60 seconds.The needles were then removed and the area cleansed, making sure to leave some of the prepping solution back to take advantage of its long term bactericidal properties. Vitals:   06/15/16 1331 06/15/16 1342 06/15/16 1352 06/15/16 1402  BP: 139/79 137/67 139/69 (!) 141/70  Pulse: 73 66 68 64  Resp: (!) 22 16 20 20   Temp: 97.9 F (36.6 C)   97.8 F (36.6 C)  TempSrc:      SpO2: 98% 98% 99% 98%  Weight:      Height:        Start Time: 1247 hrs. End Time: 1319 hrs. Materials & Medications:  Needle(s) Type: Teflon-coated, curved tip, Radiofrequency needle(s) Gauge: 22G Length: 10cm Medication(s): We administered lactated ringers, midazolam,  fentaNYL, triamcinolone acetonide, lidocaine (PF), triamcinolone acetonide, lidocaine (PF), ropivacaine (PF) 2 mg/mL (0.2%), and ropivacaine (PF) 2 mg/mL (0.2%). Please see chart orders for dosing details.  Imaging Guidance (Spinal):  Type of Imaging Technique: Fluoroscopy Guidance (Spinal) Indication(s): Assistance in needle guidance and placement for procedures requiring needle placement in or near specific anatomical locations not easily accessible without such assistance. Exposure Time: Please see nurses notes. Contrast: None used. Fluoroscopic Guidance: I was personally present during the use of fluoroscopy. "Tunnel Vision Technique" used to obtain the best possible view of the target area. Parallax error corrected before commencing the procedure. "Direction-depth-direction" technique used to introduce the needle under continuous pulsed fluoroscopy. Once target was reached, antero-posterior, oblique, and lateral fluoroscopic projection used confirm needle placement in all planes. Images permanently stored in EMR. Interpretation: No contrast injected. I personally interpreted the imaging intraoperatively. Adequate needle placement confirmed in multiple planes. Permanent images saved into the patient's record.  Antibiotic Prophylaxis:  Indication(s): None identified Antibiotic given: None  Post-operative Assessment:  EBL: None Complications: No immediate post-treatment complications observed by team, or reported by patient. Note: The patient tolerated the entire procedure well. A repeat set of vitals were taken after the procedure and the patient was kept under observation following institutional policy, for this type of procedure. Post-procedural neurological assessment was performed, showing return to baseline, prior to discharge. The patient was provided with post-procedure discharge instructions, including a section on how to identify potential problems. Should any problems arise concerning  this procedure, the patient was given instructions to immediately contact us, at any time, without hesitation. In any case, we plan to contact the patient by telephone for a follow-up status report regarding this interventional procedure. Comments:  No additional relevant information.  Plan of Care  Disposition: Discharge home  Discharge Date & Time: 06/15/2016; 1410 hrs.  Physician-requested Follow-up:  Return in about 6 weeks (around 07/27/2016) for post-RF eval, w/ MD.  Future Appointments Date Time Provider [place]  07/28/2016 8:00 AM Milinda Pointer, MD [facility] None  09/29/2016 1:00 PM Milinda Pointer, MD [facility] None   Medications ordered for procedure: Meds ordered this encounter  Medications  . lactated ringers infusion 1,000 mL  . midazolam (VERSED) 5 MG/5ML injection 1-2 mg    Make sure Flumazenil is available in the pyxis when using this medication. If oversedation occurs, administer 0.2 mg IV over 15 sec. If after 45 sec no response, administer 0.2 mg again over 1 min; may repeat at 1 min intervals; not to exceed 4 doses (1 mg)  . fentaNYL (SUBLIMAZE) injection 25-50 mcg    Make sure Narcan is available in the pyxis when using this medication. In the event of respiratory depression (RR< 8/min): Titrate NARCAN (naloxone) in increments of 0.1 to 0.2 mg IV at 2-3 minute intervals, until desired degree of reversal.  . triamcinolone acetonide (KENALOG-40) injection 40 mg  . lidocaine (PF) (XYLOCAINE) 1 % injection 10 mL  . triamcinolone acetonide (KENALOG-40) injection 40 mg  . lidocaine (PF) (XYLOCAINE) 1 % injection 10 mL  . ropivacaine (PF) 2 mg/mL (0.2%) (NAROPIN) injection 9 mL  . ropivacaine (PF) 2 mg/mL (0.2%) (NAROPIN) injection 9 mL  . HYDROmorphone (DILAUDID) 2 MG tablet    Sig: Take 1 tablet (2 mg total) by mouth every 6 (six) hours as needed for severe pain.    Dispense:  30 tablet    Refill:  0    Fill one day early if pharmacy is closed on  scheduled refill date.  To last until: 06/30/16   Medications administered: We administered lactated ringers, midazolam, fentaNYL, triamcinolone acetonide, lidocaine (PF), triamcinolone acetonide, lidocaine (PF), ropivacaine (PF) 2 mg/mL (0.2%), and ropivacaine (PF) 2 mg/mL (0.2%).  See the medical record for exact dosing, route, and time of administration.  Lab-work, Procedure(s), & Referral(s) Ordered: Orders Placed This Encounter  Procedures  . Radiofrequency,Lumbar  . Radiofrequency Sacroiliac Joint  . DG C-Arm 1-60 Min-No Report  . Informed Consent Details: Transcribe to consent form and obtain patient signature  . Provider attestation of informed consent for procedure/surgical case  . Verify informed consent  . Discharge instructions  . Follow-up  . Keep Oxygen Setup At Bedside   Imaging Ordered: Results for orders placed in visit on 03/30/16  DG C-Arm 1-60 Min-No Report   Narrative Fluoroscopy was utilized by the requesting physician.  No radiographic  interpretation.    New Prescriptions   No medications on file   Primary Care Physician: [facility] Location: [facility] Note by: Kathlen Brunswick. Dossie Arbour, M.D, DABA, DABAPM, DABPM, DABIPP, FIPP Date: 06/15/2016; Time: 4:14 PM  Disclaimer:  Medicine is not an Chief Strategy Officer. The only guarantee in medicine is that nothing is guaranteed. It is important to note that the decision to proceed with this intervention was based on the information collected from the patient. The Data and conclusions were drawn from the patient's questionnaire, the interview, and the physical examination. Because the information was provided in large part by the patient, it cannot be guaranteed that it has not been purposely or unconsciously manipulated. Every effort has been made to obtain as much relevant data as possible for this evaluation. It is important to note that the conclusions that lead to  this procedure are derived in large part from the available data. Always take into account that the treatment will also be dependent on availability of resources and existing treatment  guidelines, considered by other Pain Management Practitioners as being common knowledge and practice, at the time of the intervention. For Medico-Legal purposes, it is also important to point out that variation in procedural techniques and pharmacological choices are the acceptable norm. The indications, contraindications, technique, and results of the above procedure should only be interpreted and judged by a Board-Certified Interventional Pain Specialist with extensive familiarity and expertise in the same exact procedure and technique.  Instructions provided at this appointment: Patient Instructions   ____________________________________________________________________________________________  Post-Procedure instructions Instructions:  Apply ice: Fill a plastic sandwich bag with crushed ice. Cover it with a small towel and apply to injection site. Apply for 15 minutes then remove x 15 minutes. Repeat sequence on day of procedure, until you go to bed. The purpose is to minimize swelling and discomfort after procedure.  Apply heat: Apply heat to procedure site starting the day following the procedure. The purpose is to treat any soreness and discomfort from the procedure.  Food intake: Start with clear liquids (like water) and advance to regular food, as tolerated.   Physical activities: Keep activities to a minimum for the first 8 hours after the procedure.   Driving: If you have received any sedation, you are not allowed to drive for 24 hours after your procedure.  Blood thinner: Restart your blood thinner 6 hours after your procedure. (Only for those taking blood thinners)  Insulin: As soon as you can eat, you may resume your normal dosing schedule. (Only for those taking insulin)  Infection prevention:  Keep procedure site clean and dry.  Post-procedure Pain Diary: Extremely important that this be done correctly and accurately. Recorded information will be used to determine the next step in treatment.  Pain evaluated is that of treated area only. Do not include pain from an untreated area.  Complete every hour, on the hour, for the initial 8 hours. Set an alarm to help you do this part accurately.  Do not go to sleep and have it completed later. It will not be accurate.  Follow-up appointment: Keep your follow-up appointment after the procedure. Usually 2 weeks for most procedures. (6 weeks in the case of radiofrequency.) Bring you pain diary.  Expect: From numbing medicine (AKA: Local Anesthetics): Numbness or decrease in pain.Post RF Discharge Instructions  You have just completed Radiofrequency Neurotomy.  The following instructions will provide you with information and guidelines for self-care upon discharge.  If at any time you have questions or concerns please call your physician.  General Instructions:  Be alert for signs of possible infection: redness, swelling, heat, red streaks, elevated temperature, fever.  Please notify your doctor immediately if you have unusual bleeding, trouble breathing, or loss of the ability to control your bowel or bladder.  What to expect:  Most procedures involve the use of local anesthetics (numbing medicine), steroids (anti-inflammatory medicines) and possibly sedation (relaxation or nerve medicine).  Sedation may affect your memory, not allowing you to remember the procedure, or the instructions that we give you after it.  Because of this, you doctor may want to avoid providing you important information after the procedure, since you may not remember.  The doctor will be more than happy to go over the information upon your return.  Local anesthetics, on the other hand, may cause temporary numbness and weakness of the legs or arms, depending on the  location of the block.  This numbness/weaknes may last 4-6 hours ( the duration of the local anesthetic).  During this  period of numbness, you must be more careful than usual to prevent any injuries to th extremity.  Steroids will begin to work immediately after injected, but on the average, it will take 6-10 days for the swelling to come down to the point where you will be able to tell a difference in terms of the pain.  In summary, you should expect for your pain to get better within 15-20 minutes after the procedure.  This relieve or numbness should last 4-6 hours, after which, it will wear off.  Once it wears off, you may experience more pain than usual for 4-6 days, or until the steroids "kick in".  This discomfort is due to the procedure itself.  To minimize this, we recommend applying ice (fill a plastic sandwich bag with ice and wrap it on a towel to prevent frostbite) to the area, 15 minutes on and 15 minutes off, the day of the procedure.  This will minimize any swelling.  Starting the next day, you should then start with heat (moist or dry, it does not matter).  Heat therapy should continue until the pain improves (4-6 days).  Be careful not to burn yourself.  In the case of Radiofrequency procedures, you should expect more pain than usual for 5 to 6 weeks after the procedure.  This is how long it takes the burned tissue to heal.  We cannot assess any definite results on the success of the procedure until this recovery period has elapsed.  Post-Procedure Care:  You will want to be careful in moving about.  Muscle spasms in the area of the injection may occur.  Use ice or heat to the area is often helpful.   Occasionally, a spinal headache can develop.  This is different from a normal headache in the sense that it will not go away on it's own, despite normal measures.  If you develop a headache that does not seem to respond to conservative therapy, please let your physician know.  This can be treated  with an epidural blood patch.   You may experience some numbness or redness,m however it should be short lived.  If persistent numbness occurs, contact your physician. (Persistent numbness would be defined as lasting more than 4-6 hours).  Use care in moving the effected arm or leg to avoid further injury.  You may be given a sling or crutches to use temporarily.  Some discoloration may be present in your arms or leg for 24-48 hours.  If you experience shortness of breath or if your lips or fingers develop a dusky or "blue" appearance, contact your physician or go to the emergency room.  Diet  No eating limitations, unless stipulated above.  Nevertheless, if you have had sedation, you may experience some nausea.  In this case, it may be wise to wait at least two hours prior to resuming regular diet.  Comfort  You may experience a temporary increase in pain.  You will also experience tenderness at the site of injection, which may be accompanied with muscle spasms.  Use of cold or heat is usually helpful.  Take your prescription as directed.  Always exercise caution when taking pain pills.  Activity:  For the first 24 hours after the procedure, do not drive a motor vehicle,  Operate heavy machinery or power tools or handle any weapons.  Consider walking with the use of an assistive device or accompanied by an adult for the next 24 hours.  Do not drink alcoholic beverages  including beer.  Do not make any important decisions or sign any legal documents. Go home and rest today.  Resume activities tomorrow, as tolerated.  Use caution in moving about as you may experience mild leg weakness.  Use caution in cooking, use of household electrical appliances and climbing steps.  Medications  May resume pre-procedure medications.  Do not take any drugs, other than what has been prescribed to you.   Other none  GENERAL RISKS AND COMPLICATIONS  What are the risk, side effects and possible  complications? Generally speaking, most procedures are safe.  However, with any procedure there are risks, side effects, and the possibility of complications.  The risks and complications are dependent upon the sites that are lesioned, or the type of nerve block to be performed.  The closer the procedure is to the spine, the more serious the risks are.  Great care is taken when placing the radio frequency needles, block needles or lesioning probes, but sometimes complications can occur. 1. Infection: Any time there is an injection through the skin, there is a risk of infection.  This is why sterile conditions are used for these blocks.  There are four possible types of infection. 1. Localized skin infection. 2. Central Nervous System Infection-This can be in the form of Meningitis, which can be deadly. 3. Epidural Infections-This can be in the form of an epidural abscess, which can cause pressure inside of the spine, causing compression of the spinal cord with subsequent paralysis. This would require an emergency surgery to decompress, and there are no guarantees that the patient would recover from the paralysis. 4. Discitis-This is an infection of the intervertebral discs.  It occurs in about 1% of discography procedures.  It is difficult to treat and it may lead to surgery.        2. Pain: the needles have to go through skin and soft tissues, will cause soreness.       3. Damage to internal structures:  The nerves to be lesioned may be near blood vessels or    other nerves which can be potentially damaged.       4. Bleeding: Bleeding is more common if the patient is taking blood thinners such as  aspirin, Coumadin, Ticiid, Plavix, etc., or if he/she have some genetic predisposition  such as hemophilia. Bleeding into the spinal canal can cause compression of the spinal  cord with subsequent paralysis.  This would require an emergency surgery to  decompress and there are no guarantees that the patient  would recover from the  paralysis.       5. Pneumothorax:  Puncturing of a lung is a possibility, every time a needle is introduced in  the area of the chest or upper back.  Pneumothorax refers to free air around the  collapsed lung(s), inside of the thoracic cavity (chest cavity).  Another two possible  complications related to a similar event would include: Hemothorax and Chylothorax.   These are variations of the Pneumothorax, where instead of air around the collapsed  lung(s), you may have blood or chyle, respectively.       6. Spinal headaches: They may occur with any procedures in the area of the spine.       7. Persistent CSF (Cerebro-Spinal Fluid) leakage: This is a rare problem, but may occur  with prolonged intrathecal or epidural catheters either due to the formation of a fistulous  track or a dural tear.       8.  Nerve damage: By working so close to the spinal cord, there is always a possibility of  nerve damage, which could be as serious as a permanent spinal cord injury with  paralysis.       9. Death:  Although rare, severe deadly allergic reactions known as "Anaphylactic  reaction" can occur to any of the medications used.      10. Worsening of the symptoms:  We can always make thing worse.  What are the chances of something like this happening? Chances of any of this occuring are extremely low.  By statistics, you have more of a chance of getting killed in a motor vehicle accident: while driving to the hospital than any of the above occurring .  Nevertheless, you should be aware that they are possibilities.  In general, it is similar to taking a shower.  Everybody knows that you can slip, hit your head and get killed.  Does that mean that you should not shower again?  Nevertheless always keep in mind that statistics do not mean anything if you happen to be on the wrong side of them.  Even if a procedure has a 1 (one) in a 1,000,000 (million) chance of going wrong, it you happen to be that  one..Also, keep in mind that by statistics, you have more of a chance of having something go wrong when taking medications.  Who should not have this procedure? If you are on a blood thinning medication (e.g. Coumadin, Plavix, see list of "Blood Thinners"), or if you have an active infection going on, you should not have the procedure.  If you are taking any blood thinners, please inform your physician.  How should I prepare for this procedure?  Do not eat or drink anything at least six hours prior to the procedure.  Bring a driver with you .  It cannot be a taxi.  Come accompanied by an adult that can drive you back, and that is strong enough to help you if your legs get weak or numb from the local anesthetic.  Take all of your medicines the morning of the procedure with just enough water to swallow them.  If you have diabetes, make sure that you are scheduled to have your procedure done first thing in the morning, whenever possible.  If you have diabetes, take only half of your insulin dose and notify our nurse that you have done so as soon as you arrive at the clinic.  If you are diabetic, but only take blood sugar pills (oral hypoglycemic), then do not take them on the morning of your procedure.  You may take them after you have had the procedure.  Do not take aspirin or any aspirin-containing medications, at least eleven (11) days prior to the procedure.  They may prolong bleeding.  Wear loose fitting clothing that may be easy to take off and that you would not mind if it got stained with Betadine or blood.  Do not wear any jewelry or perfume  Remove any nail coloring.  It will interfere with some of our monitoring equipment.  NOTE: Remember that this is not meant to be interpreted as a complete list of all possible complications.  Unforeseen problems may occur.  BLOOD THINNERS The following drugs contain aspirin or other products, which can cause increased bleeding during  surgery and should not be taken for 2 weeks prior to and 1 week after surgery.  If you should need take something for relief of minor pain,  you may take acetaminophen which is found in Tylenol,m Datril, Anacin-3 and Panadol. It is not blood thinner. The products listed below are.  Do not take any of the products listed below in addition to any listed on your instruction sheet.  A.P.C or A.P.C with Codeine Codeine Phosphate Capsules #3 Ibuprofen Ridaura  ABC compound Congesprin Imuran rimadil  Advil Cope Indocin Robaxisal  Alka-Seltzer Effervescent Pain Reliever and Antacid Coricidin or Coricidin-D  Indomethacin Rufen  Alka-Seltzer plus Cold Medicine Cosprin Ketoprofen S-A-C Tablets  Anacin Analgesic Tablets or Capsules Coumadin Korlgesic Salflex  Anacin Extra Strength Analgesic tablets or capsules CP-2 Tablets Lanoril Salicylate  Anaprox Cuprimine Capsules Levenox Salocol  Anexsia-D Dalteparin Magan Salsalate  Anodynos Darvon compound Magnesium Salicylate Sine-off  Ansaid Dasin Capsules Magsal Sodium Salicylate  Anturane Depen Capsules Marnal Soma  APF Arthritis pain formula Dewitt's Pills Measurin Stanback  Argesic Dia-Gesic Meclofenamic Sulfinpyrazone  Arthritis Bayer Timed Release Aspirin Diclofenac Meclomen Sulindac  Arthritis pain formula Anacin Dicumarol Medipren Supac  Analgesic (Safety coated) Arthralgen Diffunasal Mefanamic Suprofen  Arthritis Strength Bufferin Dihydrocodeine Mepro Compound Suprol  Arthropan liquid Dopirydamole Methcarbomol with Aspirin Synalgos  ASA tablets/Enseals Disalcid Micrainin Tagament  Ascriptin Doan's Midol Talwin  Ascriptin A/D Dolene Mobidin Tanderil  Ascriptin Extra Strength Dolobid Moblgesic Ticlid  Ascriptin with Codeine Doloprin or Doloprin with Codeine Momentum Tolectin  Asperbuf Duoprin Mono-gesic Trendar  Aspergum Duradyne Motrin or Motrin IB Triminicin  Aspirin plain, buffered or enteric coated Durasal Myochrisine Trigesic  Aspirin  Suppositories Easprin Nalfon Trillsate  Aspirin with Codeine Ecotrin Regular or Extra Strength Naprosyn Uracel  Atromid-S Efficin Naproxen Ursinus  Auranofin Capsules Elmiron Neocylate Vanquish  Axotal Emagrin Norgesic Verin  Azathioprine Empirin or Empirin with Codeine Normiflo Vitamin E  Azolid Emprazil Nuprin Voltaren  Bayer Aspirin plain, buffered or children's or timed BC Tablets or powders Encaprin Orgaran Warfarin Sodium  Buff-a-Comp Enoxaparin Orudis Zorpin  Buff-a-Comp with Codeine Equegesic Os-Cal-Gesic   Buffaprin Excedrin plain, buffered or Extra Strength Oxalid   Bufferin Arthritis Strength Feldene Oxphenbutazone   Bufferin plain or Extra Strength Feldene Capsules Oxycodone with Aspirin   Bufferin with Codeine Fenoprofen Fenoprofen Pabalate or Pabalate-SF   Buffets II Flogesic Panagesic   Buffinol plain or Extra Strength Florinal or Florinal with Codeine Panwarfarin   Buf-Tabs Flurbiprofen Penicillamine   Butalbital Compound Four-way cold tablets Penicillin   Butazolidin Fragmin Pepto-Bismol   Carbenicillin Geminisyn Percodan   Carna Arthritis Reliever Geopen Persantine   Carprofen Gold's salt Persistin   Chloramphenicol Goody's Phenylbutazone   Chloromycetin Haltrain Piroxlcam   Clmetidine heparin Plaquenil   Cllnoril Hyco-pap Ponstel   Clofibrate Hydroxy chloroquine Propoxyphen         Before stopping any of these medications, be sure to consult the physician who ordered them.  Some, such as Coumadin (Warfarin) are ordered to prevent or treat serious conditions such as "deep thrombosis", "pumonary embolisms", and other heart problems.  The amount of time that you may need off of the medication may also vary with the medication and the reason for which you were taking it.  If you are taking any of these medications, please make sure you notify your pain physician before you undergo any procedures.            Onset: Full effect within 15 minutes of  injected.  Duration: It will depend on the type of local anesthetic used. On the average, 1 to 8 hours.   From steroids: Decrease in swelling or inflammation. Once inflammation is improved, relief of  the pain will follow.  Onset of benefits: Depends on the amount of swelling present. The more swelling, the longer it will take for the benefits to be seen.   Duration: Steroids will stay in the system x 2 weeks. Duration of benefits will depend on multiple posibilities including persistent irritating factors.  From procedure: Some discomfort is to be expected once the numbing medicine wears off. This should be minimal if ice and heat are applied as instructed. Call if:  You experience numbness and weakness that gets worse with time, as opposed to wearing off.  New onset bowel or bladder incontinence. (Spinal procedures only)  Emergency Numbers:  [facility] business hours (Monday - Thursday, 8:00 AM - 4:00 PM) (Friday, 9:00 AM - 12:00 Noon): [phone]  After hours: [phone] ____________________________________________________________________________________________

## 2016-06-15 NOTE — Patient Instructions (Addendum)
____________________________________________________________________________________________  Post-Procedure instructions Instructions:  Apply ice: Fill a plastic sandwich bag with crushed ice. Cover it with a small towel and apply to injection site. Apply for 15 minutes then remove x 15 minutes. Repeat sequence on day of procedure, until you go to bed. The purpose is to minimize swelling and discomfort after procedure.  Apply heat: Apply heat to procedure site starting the day following the procedure. The purpose is to treat any soreness and discomfort from the procedure.  Food intake: Start with clear liquids (like water) and advance to regular food, as tolerated.   Physical activities: Keep activities to a minimum for the first 8 hours after the procedure.   Driving: If you have received any sedation, you are not allowed to drive for 24 hours after your procedure.  Blood thinner: Restart your blood thinner 6 hours after your procedure. (Only for those taking blood thinners)  Insulin: As soon as you can eat, you may resume your normal dosing schedule. (Only for those taking insulin)  Infection prevention: Keep procedure site clean and dry.  Post-procedure Pain Diary: Extremely important that this be done correctly and accurately. Recorded information will be used to determine the next step in treatment.  Pain evaluated is that of treated area only. Do not include pain from an untreated area.  Complete every hour, on the hour, for the initial 8 hours. Set an alarm to help you do this part accurately.  Do not go to sleep and have it completed later. It will not be accurate.  Follow-up appointment: Keep your follow-up appointment after the procedure. Usually 2 weeks for most procedures. (6 weeks in the case of radiofrequency.) Bring you pain diary.  Expect: From numbing medicine (AKA: Local Anesthetics): Numbness or decrease in pain.Post RF Discharge Instructions  You have just  completed Radiofrequency Neurotomy.  The following instructions will provide you with information and guidelines for self-care upon discharge.  If at any time you have questions or concerns please call your physician.  General Instructions:  Be alert for signs of possible infection: redness, swelling, heat, red streaks, elevated temperature, fever.  Please notify your doctor immediately if you have unusual bleeding, trouble breathing, or loss of the ability to control your bowel or bladder.  What to expect:  Most procedures involve the use of local anesthetics (numbing medicine), steroids (anti-inflammatory medicines) and possibly sedation (relaxation or nerve medicine).  Sedation may affect your memory, not allowing you to remember the procedure, or the instructions that we give you after it.  Because of this, you doctor may want to avoid providing you important information after the procedure, since you may not remember.  The doctor will be more than happy to go over the information upon your return.  Local anesthetics, on the other hand, may cause temporary numbness and weakness of the legs or arms, depending on the location of the block.  This numbness/weaknes may last 4-6 hours ( the duration of the local anesthetic).  During this period of numbness, you must be more careful than usual to prevent any injuries to th extremity.  Steroids will begin to work immediately after injected, but on the average, it will take 6-10 days for the swelling to come down to the point where you will be able to tell a difference in terms of the pain.  In summary, you should expect for your pain to get better within 15-20 minutes after the procedure.  This relieve or numbness should last 4-6 hours, after  which, it will wear off.  Once it wears off, you may experience more pain than usual for 4-6 days, or until the steroids "kick in".  This discomfort is due to the procedure itself.  To minimize this, we recommend applying  ice (fill a plastic sandwich bag with ice and wrap it on a towel to prevent frostbite) to the area, 15 minutes on and 15 minutes off, the day of the procedure.  This will minimize any swelling.  Starting the next day, you should then start with heat (moist or dry, it does not matter).  Heat therapy should continue until the pain improves (4-6 days).  Be careful not to burn yourself.  In the case of Radiofrequency procedures, you should expect more pain than usual for 5 to 6 weeks after the procedure.  This is how long it takes the burned tissue to heal.  We cannot assess any definite results on the success of the procedure until this recovery period has elapsed.  Post-Procedure Care:  You will want to be careful in moving about.  Muscle spasms in the area of the injection may occur.  Use ice or heat to the area is often helpful.   Occasionally, a spinal headache can develop.  This is different from a normal headache in the sense that it will not go away on it's own, despite normal measures.  If you develop a headache that does not seem to respond to conservative therapy, please let your physician know.  This can be treated with an epidural blood patch.   You may experience some numbness or redness,m however it should be short lived.  If persistent numbness occurs, contact your physician. (Persistent numbness would be defined as lasting more than 4-6 hours).  Use care in moving the effected arm or leg to avoid further injury.  You may be given a sling or crutches to use temporarily.  Some discoloration may be present in your arms or leg for 24-48 hours.  If you experience shortness of breath or if your lips or fingers develop a dusky or "blue" appearance, contact your physician or go to the emergency room.  Diet  No eating limitations, unless stipulated above.  Nevertheless, if you have had sedation, you may experience some nausea.  In this case, it may be wise to wait at least two hours prior to resuming  regular diet.  Comfort  You may experience a temporary increase in pain.  You will also experience tenderness at the site of injection, which may be accompanied with muscle spasms.  Use of cold or heat is usually helpful.  Take your prescription as directed.  Always exercise caution when taking pain pills.  Activity:  For the first 24 hours after the procedure, do not drive a motor vehicle,  Operate heavy machinery or power tools or handle any weapons.  Consider walking with the use of an assistive device or accompanied by an adult for the next 24 hours.  Do not drink alcoholic beverages including beer.  Do not make any important decisions or sign any legal documents. Go home and rest today.  Resume activities tomorrow, as tolerated.  Use caution in moving about as you may experience mild leg weakness.  Use caution in cooking, use of household electrical appliances and climbing steps.  Medications  May resume pre-procedure medications.  Do not take any drugs, other than what has been prescribed to you.   Other none  GENERAL RISKS AND COMPLICATIONS  What are the  risk, side effects and possible complications? Generally speaking, most procedures are safe.  However, with any procedure there are risks, side effects, and the possibility of complications.  The risks and complications are dependent upon the sites that are lesioned, or the type of nerve block to be performed.  The closer the procedure is to the spine, the more serious the risks are.  Great care is taken when placing the radio frequency needles, block needles or lesioning probes, but sometimes complications can occur. 1. Infection: Any time there is an injection through the skin, there is a risk of infection.  This is why sterile conditions are used for these blocks.  There are four possible types of infection. 1. Localized skin infection. 2. Central Nervous System Infection-This can be in the form of Meningitis, which can be  deadly. 3. Epidural Infections-This can be in the form of an epidural abscess, which can cause pressure inside of the spine, causing compression of the spinal cord with subsequent paralysis. This would require an emergency surgery to decompress, and there are no guarantees that the patient would recover from the paralysis. 4. Discitis-This is an infection of the intervertebral discs.  It occurs in about 1% of discography procedures.  It is difficult to treat and it may lead to surgery.        2. Pain: the needles have to go through skin and soft tissues, will cause soreness.       3. Damage to internal structures:  The nerves to be lesioned may be near blood vessels or    other nerves which can be potentially damaged.       4. Bleeding: Bleeding is more common if the patient is taking blood thinners such as  aspirin, Coumadin, Ticiid, Plavix, etc., or if he/she have some genetic predisposition  such as hemophilia. Bleeding into the spinal canal can cause compression of the spinal  cord with subsequent paralysis.  This would require an emergency surgery to  decompress and there are no guarantees that the patient would recover from the  paralysis.       5. Pneumothorax:  Puncturing of a lung is a possibility, every time a needle is introduced in  the area of the chest or upper back.  Pneumothorax refers to free air around the  collapsed lung(s), inside of the thoracic cavity (chest cavity).  Another two possible  complications related to a similar event would include: Hemothorax and Chylothorax.   These are variations of the Pneumothorax, where instead of air around the collapsed  lung(s), you may have blood or chyle, respectively.       6. Spinal headaches: They may occur with any procedures in the area of the spine.       7. Persistent CSF (Cerebro-Spinal Fluid) leakage: This is a rare problem, but may occur  with prolonged intrathecal or epidural catheters either due to the formation of a fistulous  track  or a dural tear.       8. Nerve damage: By working so close to the spinal cord, there is always a possibility of  nerve damage, which could be as serious as a permanent spinal cord injury with  paralysis.       9. Death:  Although rare, severe deadly allergic reactions known as "Anaphylactic  reaction" can occur to any of the medications used.      10. Worsening of the symptoms:  We can always make thing worse.  What are the chances of something  like this happening? Chances of any of this occuring are extremely low.  By statistics, you have more of a chance of getting killed in a motor vehicle accident: while driving to the hospital than any of the above occurring .  Nevertheless, you should be aware that they are possibilities.  In general, it is similar to taking a shower.  Everybody knows that you can slip, hit your head and get killed.  Does that mean that you should not shower again?  Nevertheless always keep in mind that statistics do not mean anything if you happen to be on the wrong side of them.  Even if a procedure has a 1 (one) in a 1,000,000 (million) chance of going wrong, it you happen to be that one..Also, keep in mind that by statistics, you have more of a chance of having something go wrong when taking medications.  Who should not have this procedure? If you are on a blood thinning medication (e.g. Coumadin, Plavix, see list of "Blood Thinners"), or if you have an active infection going on, you should not have the procedure.  If you are taking any blood thinners, please inform your physician.  How should I prepare for this procedure?  Do not eat or drink anything at least six hours prior to the procedure.  Bring a driver with you .  It cannot be a taxi.  Come accompanied by an adult that can drive you back, and that is strong enough to help you if your legs get weak or numb from the local anesthetic.  Take all of your medicines the morning of the procedure with just enough water  to swallow them.  If you have diabetes, make sure that you are scheduled to have your procedure done first thing in the morning, whenever possible.  If you have diabetes, take only half of your insulin dose and notify our nurse that you have done so as soon as you arrive at the clinic.  If you are diabetic, but only take blood sugar pills (oral hypoglycemic), then do not take them on the morning of your procedure.  You may take them after you have had the procedure.  Do not take aspirin or any aspirin-containing medications, at least eleven (11) days prior to the procedure.  They may prolong bleeding.  Wear loose fitting clothing that may be easy to take off and that you would not mind if it got stained with Betadine or blood.  Do not wear any jewelry or perfume  Remove any nail coloring.  It will interfere with some of our monitoring equipment.  NOTE: Remember that this is not meant to be interpreted as a complete list of all possible complications.  Unforeseen problems may occur.  BLOOD THINNERS The following drugs contain aspirin or other products, which can cause increased bleeding during surgery and should not be taken for 2 weeks prior to and 1 week after surgery.  If you should need take something for relief of minor pain, you may take acetaminophen which is found in Tylenol,m Datril, Anacin-3 and Panadol. It is not blood thinner. The products listed below are.  Do not take any of the products listed below in addition to any listed on your instruction sheet.  A.P.C or A.P.C with Codeine Codeine Phosphate Capsules #3 Ibuprofen Ridaura  ABC compound Congesprin Imuran rimadil  Advil Cope Indocin Robaxisal  Alka-Seltzer Effervescent Pain Reliever and Antacid Coricidin or Coricidin-D  Indomethacin Rufen  Alka-Seltzer plus Cold Medicine Cosprin Ketoprofen S-A-C  Tablets  Anacin Analgesic Tablets or Capsules Coumadin Korlgesic Salflex  Anacin Extra Strength Analgesic tablets or capsules  CP-2 Tablets Lanoril Salicylate  Anaprox Cuprimine Capsules Levenox Salocol  Anexsia-D Dalteparin Magan Salsalate  Anodynos Darvon compound Magnesium Salicylate Sine-off  Ansaid Dasin Capsules Magsal Sodium Salicylate  Anturane Depen Capsules Marnal Soma  APF Arthritis pain formula Dewitt's Pills Measurin Stanback  Argesic Dia-Gesic Meclofenamic Sulfinpyrazone  Arthritis Bayer Timed Release Aspirin Diclofenac Meclomen Sulindac  Arthritis pain formula Anacin Dicumarol Medipren Supac  Analgesic (Safety coated) Arthralgen Diffunasal Mefanamic Suprofen  Arthritis Strength Bufferin Dihydrocodeine Mepro Compound Suprol  Arthropan liquid Dopirydamole Methcarbomol with Aspirin Synalgos  ASA tablets/Enseals Disalcid Micrainin Tagament  Ascriptin Doan's Midol Talwin  Ascriptin A/D Dolene Mobidin Tanderil  Ascriptin Extra Strength Dolobid Moblgesic Ticlid  Ascriptin with Codeine Doloprin or Doloprin with Codeine Momentum Tolectin  Asperbuf Duoprin Mono-gesic Trendar  Aspergum Duradyne Motrin or Motrin IB Triminicin  Aspirin plain, buffered or enteric coated Durasal Myochrisine Trigesic  Aspirin Suppositories Easprin Nalfon Trillsate  Aspirin with Codeine Ecotrin Regular or Extra Strength Naprosyn Uracel  Atromid-S Efficin Naproxen Ursinus  Auranofin Capsules Elmiron Neocylate Vanquish  Axotal Emagrin Norgesic Verin  Azathioprine Empirin or Empirin with Codeine Normiflo Vitamin E  Azolid Emprazil Nuprin Voltaren  Bayer Aspirin plain, buffered or children's or timed BC Tablets or powders Encaprin Orgaran Warfarin Sodium  Buff-a-Comp Enoxaparin Orudis Zorpin  Buff-a-Comp with Codeine Equegesic Os-Cal-Gesic   Buffaprin Excedrin plain, buffered or Extra Strength Oxalid   Bufferin Arthritis Strength Feldene Oxphenbutazone   Bufferin plain or Extra Strength Feldene Capsules Oxycodone with Aspirin   Bufferin with Codeine Fenoprofen Fenoprofen Pabalate or Pabalate-SF   Buffets II Flogesic Panagesic    Buffinol plain or Extra Strength Florinal or Florinal with Codeine Panwarfarin   Buf-Tabs Flurbiprofen Penicillamine   Butalbital Compound Four-way cold tablets Penicillin   Butazolidin Fragmin Pepto-Bismol   Carbenicillin Geminisyn Percodan   Carna Arthritis Reliever Geopen Persantine   Carprofen Gold's salt Persistin   Chloramphenicol Goody's Phenylbutazone   Chloromycetin Haltrain Piroxlcam   Clmetidine heparin Plaquenil   Cllnoril Hyco-pap Ponstel   Clofibrate Hydroxy chloroquine Propoxyphen         Before stopping any of these medications, be sure to consult the physician who ordered them.  Some, such as Coumadin (Warfarin) are ordered to prevent or treat serious conditions such as "deep thrombosis", "pumonary embolisms", and other heart problems.  The amount of time that you may need off of the medication may also vary with the medication and the reason for which you were taking it.  If you are taking any of these medications, please make sure you notify your pain physician before you undergo any procedures.            Onset: Full effect within 15 minutes of injected.  Duration: It will depend on the type of local anesthetic used. On the average, 1 to 8 hours.   From steroids: Decrease in swelling or inflammation. Once inflammation is improved, relief of the pain will follow.  Onset of benefits: Depends on the amount of swelling present. The more swelling, the longer it will take for the benefits to be seen.   Duration: Steroids will stay in the system x 2 weeks. Duration of benefits will depend on multiple posibilities including persistent irritating factors.  From procedure: Some discomfort is to be expected once the numbing medicine wears off. This should be minimal if ice and heat are applied as instructed. Call if:  You experience numbness  and weakness that gets worse with time, as opposed to wearing off.  New onset bowel or bladder incontinence. (Spinal  procedures only)  Emergency Numbers:  [facility] business hours (Monday - Thursday, 8:00 AM - 4:00 PM) (Friday, 9:00 AM - 12:00 Noon): [phone]  After hours: [phone] ____________________________________________________________________________________________

## 2016-06-16 ENCOUNTER — Telehealth: Payer: Self-pay | Admitting: *Deleted

## 2016-06-16 NOTE — Telephone Encounter (Signed)
Voicemail left with patient to call our office if there are questions or concerns re; procedure on yesterday.  

## 2016-07-09 NOTE — Progress Notes (Signed)
  ETOH NOTE: This forensic urine drug screen (UDS) test was conducted using a state-of-the-art ultra high performance liquid chromatography and mass spectrometry system (UPLC/MS-MS), the most sophisticated and accurate method available. UPLC/MS-MS is 1,000 times more precise and accurate than standard gas chromatography and mass spectrometry (GC/MS). This system can analyze 26 drug categories and 180 drug compounds.  Evidence of ETOH found on UDS. This is always concerning, especially when alcohol and opioid metabolites are both present at the same time. This can lead to respiratory depression and death. The issue will be discussed with the patient at length. Possible sources include: consumption of alcoholic beverages, or use of medications or products containing alcohol, such as NyQuil, or some cough medicines (antitussives). _

## 2016-07-13 ENCOUNTER — Ambulatory Visit: Payer: Medicare Other | Admitting: Pain Medicine

## 2016-07-27 NOTE — Progress Notes (Signed)
Patient's Name: Manuel Knight  MRN: [id number]  Referring Provider: [facility]  DOB: August 17, 1960  PCP: [facility]  DOS: 07/28/2016  Note by: Gaspar Cola, MD  Service setting: Ambulatory outpatient  Specialty: Interventional Pain Management  Location: [facility] (AMB) Pain Management Facility    Patient type: Established   Primary Reason(s) for Visit: Encounter for post-procedure evaluation of chronic illness with mild to moderate exacerbation CC: No chief complaint on file.  HPI  Manuel Knight is a 56 y.o. year old, male patient, who comes today for a post-procedure evaluation. He has Type 2 diabetes mellitus with hyperglycemia, with long-term current use of insulin ([place]); HLD (hyperlipidemia); Obesity, unspecified; Anxiety state; Essential hypertension; COPD with emphysema ([place]); PULMONARY NODULE; HERNIA; GERD (gastroesophageal reflux disease); Esophageal dysphagia; NSTEMI (non-ST elevated myocardial infarction) ([place]); CAD (coronary artery disease), native coronary artery; S/P CABG x 3; Chronic systolic heart failure ([place]); Emphysema of lung ([place]); Lumbar spondylosis with lumbar radiculitis (Left); H/O coronary artery bypass surgery; Encounter for pain management planning; Encounter for therapeutic drug level monitoring; Opiate use (90 MME/Day); Chronic low back pain (Location of Secondary source of pain) (Bilateral) (L>R); Chronic knee pain (Location of Primary Source of Pain) (Bilateral) (L>R); Chronic foot pain (Bilateral) (L>R); Disturbance of skin sensation; Diabetic peripheral neuropathy (HCC) (lower extremity); Chronic lumbar radicular pain (L5 dermatome) (Right); Chronic sacroiliac joint pain (Bilateral) (L>R); Osteoarthritis of knee (Bilateral) (L>R); Lumbar facet syndrome (Bilateral) (L>R); Lumbar facet arthropathy ([place]); Chronic lower extremity pain (Location of Tertiary source of pain) (Bilateral) (R>L); History of marijuana use; Hypomagnesemia; Vitamin  D insufficiency; Long term current use of opiate analgesic; Long term prescription opiate use; Chronic pain syndrome; SIRS (systemic inflammatory response syndrome) ([place]); and Spondylosis of lumbar spine on his problem list. His primarily concern today is the No chief complaint on file.  Pain Assessment: Location: Lower Back Radiating:  rigt buttocks then radiates down legs bilaterallydown the side to top of feet-  Onset: More than a month ago Duration: Chronic pain Quality: Aching, Constant Severity: 1 /10 (self-reported pain score)  Note: Reported level is compatible with observation.                   Effect on ADL: goes a little slower than usual  pace self Timing: Constant Modifying factors: medications- procedures for a few days  Manuel Knight comes in today for post-procedure evaluation after the treatment done on 06/15/2016.  Further details on both, my assessment(s), as well as the proposed treatment plan, please see below.  Post-Procedure Assessment  06/15/2016 Procedure: Therapeutic, right-sided, lumbar facet + sacroiliac joint radiofrequency ablation under fluoroscopic guidance and IV sedation Pre-procedure pain score:  3/10 Post-procedure pain score: 1/10         Influential Factors: BMI: 34.79 kg/m Intra-procedural challenges: None observed.         Assessment challenges: None detected.              Reported side-effects: None.        Post-procedural adverse reactions or complications: None reported         Sedation: Sedation provided. When no sedatives are used, the analgesic levels obtained are directly associated to the effectiveness of the local anesthetics. However, when sedation is provided, the level of analgesia obtained during the initial 1 hour following the intervention, is believed to be the result of a combination of factors. These factors may include, but are not limited to: 1. The effectiveness of the local  anesthetics used. 2. The effects of the analgesic(s)  and/or anxiolytic(s) used. 3. The degree of discomfort experienced by the patient at the time of the procedure. 4. The patients ability and reliability in recalling and recording the events. 5. The presence and influence of possible secondary gains and/or psychosocial factors. Reported result: Relief experienced during the 1st hour after the procedure: 100 % (Ultra-Short Term Relief) Interpretative annotation: Clinically appropriate result. Analgesia during this period is likely to be Local Anesthetic and/or IV Sedative (Analgesic/Anxiolytic) related.          Effects of local anesthetic: The analgesic effects attained during this period are directly associated to the localized infiltration of local anesthetics and therefore cary significant diagnostic value as to the etiological location, or anatomical origin, of the pain. Expected duration of relief is directly dependent on the pharmacodynamics of the local anesthetic used. Long-acting (4-6 hours) anesthetics used.  Reported result: Relief during the next 4 to 6 hour after the procedure: 100 % (Short-Term Relief) Interpretative annotation: Clinically appropriate result. Analgesia during this period is likely to be Local Anesthetic-related.          Long-term benefit: Defined as the period of time past the expected duration of local anesthetics. With the possible exception of prolonged sympathetic blockade from the local anesthetics, benefits during this period are typically attributed to, or associated with, other factors such as analgesic sensory neuropraxia, antiinflammatory effects, or beneficial biochemical changes provided by agents other than the local anesthetics Reported result: Extended relief following procedure: 15 % (lastest about 10 days- then started hurting) (Long-Term Relief) Interpretative annotation: Clinically appropriate result. Good relief. No permanent benefit expected. Inflammation plays a part in the etiology to the pain.           Current benefits: Defined as persistent relief that continues at this point in time.   Reported results: Treated area: 50 %       Interpretative annotation: Recurrence of symptoms. No permanent benefit expected. Effective diagnostic intervention.          Interpretation: Results would suggest a successful diagnostic intervention.          Laboratory Chemistry  Inflammation Markers (CRP: Acute Phase) (ESR: Chronic Phase) Lab Results  Component Value Date   CRP 5.8 (H) 06/04/2016   ESRSEDRATE 2 06/04/2016                 Renal Function Markers Lab Results  Component Value Date   BUN 20 06/04/2016   CREATININE 1.08 06/04/2016   GFRAA 88 06/04/2016   GFRNONAA 76 06/04/2016                 Hepatic Function Markers Lab Results  Component Value Date   AST 16 06/04/2016   ALT 26 06/04/2016   ALBUMIN 4.3 06/04/2016   ALKPHOS 63 06/04/2016   HCVAB NEGATIVE 11/22/2008                 Electrolytes Lab Results  Component Value Date   NA 139 06/04/2016   K 5.4 (H) 06/04/2016   CL 97 06/04/2016   CALCIUM 9.5 06/04/2016   MG 1.7 06/04/2016                 Neuropathy Markers Lab Results  Component Value Date   VITAMINB12 473 06/04/2016                 Bone Pathology Markers Lab Results  Component Value Date   ALKPHOS 63 06/04/2016  VD25OH 31 02/03/2010   25OHVITD1 31 06/04/2016   25OHVITD2 1.9 06/04/2016   25OHVITD3 29 06/04/2016   CALCIUM 9.5 06/04/2016                 Coagulation Parameters Lab Results  Component Value Date   INR 1.59 (H) 02/06/2013   LABPROT 18.5 (H) 02/06/2013   APTT 40 (H) 02/06/2013   PLT 307 03/15/2015                 Cardiovascular Markers Lab Results  Component Value Date   HGB 14.5 03/15/2015   HCT 45.5 03/15/2015                 Note: Lab results reviewed.  Recent Diagnostic Imaging Review  Dg C-arm 1-60 Min-no Report  Result Date: 06/15/2016 Fluoroscopy was utilized by the requesting physician.  No radiographic  interpretation.   Note: Imaging results reviewed.          Meds   Current Meds  Medication Sig  . albuterol (PROAIR HFA) 108 (90 Base) MCG/ACT inhaler Inhale 1 puff into the lungs daily.   Marland Kitchen albuterol (PROVENTIL) (2.5 MG/3ML) 0.083% nebulizer solution Inhale 2.5 mg into the lungs 2 (two) times daily as needed.   Marland Kitchen alprazolam (XANAX) 2 MG tablet Take 1 tablet by mouth 3 (three) times daily.   Marland Kitchen amLODipine (NORVASC) 10 MG tablet Take 10 mg by mouth daily.  Marland Kitchen amphetamine-dextroamphetamine (ADDERALL) 30 MG tablet Take 30 mg by mouth 3 (three) times daily.   Marland Kitchen aspirin EC 81 MG tablet Take 1 tablet (81 mg total) by mouth daily.  Marland Kitchen atorvastatin (LIPITOR) 80 MG tablet Take 1 tablet by mouth daily.  . benazepril (LOTENSIN) 20 MG tablet Take 20 mg by mouth daily.   . Canagliflozin (INVOKANA) 100 MG TABS Take 100 mg by mouth daily.  . carvedilol (COREG) 25 MG tablet Take 25 mg by mouth 2 (two) times daily with a meal.   . citalopram (CELEXA) 20 MG tablet Take 1 tablet by mouth daily.  . cloNIDine (CATAPRES) 0.3 MG tablet Take 0.3 mg by mouth 2 (two) times daily.  . D3 SUPER STRENGTH 2000 units CAPS Take 1 capsule by mouth daily.  . DULoxetine (CYMBALTA) 30 MG capsule 2 tabs daily  . fluticasone (FLONASE) 50 MCG/ACT nasal spray Place 50 sprays into both nostrils 2 (two) times daily.  . Fluticasone-Salmeterol (ADVAIR DISKUS) 500-50 MCG/DOSE AEPB Inhale 1 puff into the lungs every 12 (twelve) hours.    . gabapentin (NEURONTIN) 300 MG capsule TAKE ONE CAPSULE BY MOUTH THREE TIMES DAILY. (TAKE WITH 600MG TABLET TO TOTAL 900MG)  . gabapentin (NEURONTIN) 600 MG tablet Take 600 mg by mouth 3 (three) times daily.  . insulin aspart (NOVOLOG FLEXPEN) 100 UNIT/ML FlexPen Inject 0-26 Units into the skin 3 (three) times daily with meals. 20 units three times a day with sliding scale max of 26 units  . Insulin Pen Needle (LITETOUCH PEN NEEDLES) 29G X 12.7MM MISC Use as directed.  . Insulin Syringe-Needle U-100  (B-D INS SYR ULTRAFINE 1CC/31G) 31G X 5/16" 1 ML MISC   . Insulin Syringe-Needle U-100 (B-D INS SYRINGE 0.5CC/31GX5/16) 31G X 5/16" 0.5 ML MISC   . LEVEMIR FLEXTOUCH 100 UNIT/ML Pen INJECT 20 UNITS EVERY MORNING.  . meloxicam (MOBIC) 15 MG tablet Take 15 mg by mouth 2 (two) times daily.  . metFORMIN (GLUCOPHAGE) 1000 MG tablet Take 1,000 mg by mouth 2 (two) times daily.   . naloxone (NARCAN) 2 MG/2ML  injection Inject content of syringe into thigh muscle. Call 911.  . naloxone HCl 4 MG/0.1ML LIQD One spray in either nostril once for known/suspected opioid overdose. May repeat every 2-3 minutes in alternating nostril til EMS arrives  . nitroGLYCERIN (NITROSTAT) 0.4 MG SL tablet Place 1 tablet (0.4 mg total) under the tongue every 5 (five) minutes as needed for chest pain.  Derrill Memo ON 09/06/2016] oxyCODONE (ROXICODONE) 15 MG immediate release tablet Take 1 tablet (15 mg total) by mouth every 6 (six) hours as needed.  Marland Kitchen oxyCODONE (ROXICODONE) 15 MG immediate release tablet Take 1 tablet (15 mg total) by mouth every 6 (six) hours as needed for pain.  Derrill Memo ON 08/07/2016] oxyCODONE (ROXICODONE) 15 MG immediate release tablet Take 1 tablet (15 mg total) by mouth every 6 (six) hours as needed for pain.  . pantoprazole (PROTONIX) 40 MG tablet 1 tablet Two (2) times a day.  . sacubitril-valsartan (ENTRESTO) 97-103 MG Take 1 tablet by mouth 2 (two) times daily.  . sildenafil (REVATIO) 20 MG tablet Take 20 mg by mouth See admin instructions.  . SURE COMFORT LANCETS 30G MISC USE TO CHECK SUGAR THREE TIMES DAILY {AS DIRECTED]  . tiotropium (SPIRIVA) 18 MCG inhalation capsule Place 18 mcg into inhaler and inhale daily.    Angelia Mould TEST test strip USE TO CHECK BLOOD SUGAR THREE TIMES DAILY    ROS  Constitutional: Denies any fever or chills Gastrointestinal: No reported hemesis, hematochezia, vomiting, or acute GI distress Musculoskeletal: Denies any acute onset joint swelling, redness, loss of ROM, or  weakness Neurological: No reported episodes of acute onset apraxia, aphasia, dysarthria, agnosia, amnesia, paralysis, loss of coordination, or loss of consciousness  Allergies  Mr. Huseby is allergic to lodine [etodolac]; tylenol [acetaminophen]; iodine; and propoxyphene n-acetaminophen.  [place]  Drug: Mr. Enrique  reports that he does not use drugs. Alcohol:  reports that he does not drink alcohol. Tobacco:  reports that he quit smoking about 27 years ago. His smoking use included Cigarettes. He started smoking about 49 years ago. He has a 28.50 pack-year smoking history. He has never used smokeless tobacco. Medical:  has a past medical history of Anxiety; Cellulitis (2011); Chronic back pain; Claustrophobia; COPD (chronic obstructive pulmonary disease) ([place]); Diabetes mellitus, type 2 ([place]); Diverticulitis of colon (2008); GERD (gastroesophageal reflux disease); Heart attack ([place]); Hyperlipidemia; Hypertension; Obesity; and Pulmonary nodule. Surgical: Mr. Klingel  has a past surgical history that includes Appendectomy (2007); Tonsillectomy; Coronary artery bypass graft (N/A, 02/06/2013); and left heart catheterization with coronary angiogram (N/A, 02/03/2013). Family: family history includes Cancer in his mother; Coronary artery disease in his mother and sister; Diabetes in his sister; Heart attack in his brother and father.  Constitutional Exam  General appearance: Well nourished, well developed, and well hydrated. In no apparent acute distress Vitals:   07/28/16 0814  BP: (!) 146/76  Pulse: 82  Resp: 16  Temp: (!) 96.7 F (35.9 C)  SpO2: 96%  Weight: 239 lb (108.4 kg)  Height: 5' 9.5" (1.765 m)   BMI Assessment: Estimated body mass index is 34.79 kg/m as calculated from the following:   Height as of this encounter: 5' 9.5" (1.765 m).   Weight as of this encounter: 239 lb (108.4 kg).  BMI interpretation table: BMI level Category Range association with higher incidence of chronic pain   <18 kg/m2 Underweight   18.5-24.9 kg/m2 Ideal body weight   25-29.9 kg/m2 Overweight Increased incidence by 20%  30-34.9 kg/m2 Obese (Class I) Increased  incidence by 68%  35-39.9 kg/m2 Severe obesity (Class II) Increased incidence by 136%  >40 kg/m2 Extreme obesity (Class III) Increased incidence by 254%   BMI Readings from Last 4 Encounters:  07/28/16 34.79 kg/m  06/15/16 35.29 kg/m  06/04/16 35.88 kg/m  05/11/16 35.22 kg/m   Wt Readings from Last 4 Encounters:  07/28/16 239 lb (108.4 kg)  06/15/16 239 lb (108.4 kg)  06/04/16 243 lb (110.2 kg)  05/11/16 242 lb (109.8 kg)  Psych/Mental status: Alert, oriented x 3 (person, place, & time)       Eyes: PERLA Respiratory: No evidence of acute respiratory distress  Cervical Spine Exam  Inspection: No masses, redness, or swelling Alignment: Symmetrical Functional ROM: Unrestricted ROM      Stability: No instability detected Muscle strength & Tone: Functionally intact Sensory: Unimpaired Palpation: No palpable anomalies              Upper Extremity (UE) Exam    Side: Right upper extremity  Side: Left upper extremity  Inspection: No masses, redness, swelling, or asymmetry. No contractures  Inspection: No masses, redness, swelling, or asymmetry. No contractures  Functional ROM: Unrestricted ROM          Functional ROM: Unrestricted ROM          Muscle strength & Tone: Functionally intact  Muscle strength & Tone: Functionally intact  Sensory: Unimpaired  Sensory: Unimpaired  Palpation: No palpable anomalies              Palpation: No palpable anomalies              Specialized Test(s): Deferred         Specialized Test(s): Deferred          Thoracic Spine Exam  Inspection: No masses, redness, or swelling Alignment: Symmetrical Functional ROM: Unrestricted ROM Stability: No instability detected Sensory: Unimpaired Muscle strength & Tone: No palpable anomalies  Lumbar Spine Exam  Inspection: No masses, redness, or  swelling Alignment: Symmetrical Functional ROM: Unrestricted ROM      Stability: No instability detected Muscle strength & Tone: Functionally intact Sensory: Unimpaired Palpation: No palpable anomalies       Provocative Tests: Lumbar Hyperextension and rotation test: evaluation deferred today       Patrick's Maneuver: evaluation deferred today                    Gait & Posture Assessment  Ambulation: Unassisted Gait: Relatively normal for age and body habitus Posture: WNL   Lower Extremity Exam    Side: Right lower extremity  Side: Left lower extremity  Inspection: No masses, redness, swelling, or asymmetry. No contractures  Inspection: No masses, redness, swelling, or asymmetry. No contractures  Functional ROM: Unrestricted ROM          Functional ROM: Unrestricted ROM          Muscle strength & Tone: Functionally intact  Muscle strength & Tone: Functionally intact  Sensory: Unimpaired  Sensory: Unimpaired  Palpation: No palpable anomalies  Palpation: No palpable anomalies   Assessment  Primary Diagnosis & Pertinent Problem List: The primary encounter diagnosis was Chronic lower extremity pain (Location of Tertiary source of pain) (Bilateral) (R>L). Diagnoses of Chronic lumbar radicular pain (L5 dermatome) (Right) and Chronic pain syndrome were also pertinent to this visit.  Status Diagnosis  Controlled Controlled Controlled 1. Chronic lower extremity pain (Location of Tertiary source of pain) (Bilateral) (R>L)   2. Chronic lumbar radicular pain (L5 dermatome) (Right)  3. Chronic pain syndrome     Problems updated and reviewed during this visit: Problem  Chronic lumbar radicular pain (L5 dermatome) (Right)   Pain going down the left lower extremity in a dermatomal fashion to the top of the foot and big toe. (L5)   Chronic sacroiliac joint pain (Bilateral) (L>R)  Lumbar facet syndrome (Bilateral) (L>R)  Chronic lower extremity pain (Location of Tertiary source of pain)  (Bilateral) (R>L)   Plan of Care  Pharmacotherapy (Medications Ordered): No orders of the defined types were placed in this encounter.  New Prescriptions   No medications on file   Medications administered today: Mr. Kantz had no medications administered during this visit.  Lab-work, procedure(s), and/or referral(s): Orders Placed This Encounter  Procedures  . Lumbar Epidural Injection    Interventional therapies: Planned, scheduled, and/or pending:   Diagnostic Right L5-S1 LESI under fluoro and IV sedation.   Considering:   Diagnostic bilateralintra-articular knee injection Diagnostic bilateral genicular nerve block Possible bilateral genicular nerve radiofrequency ablation Diagnostic bilateral lumbar facet block Palliative bilaterallumbar facet radiofrequency ablation(Left side done on 03/30/2016; right on 06/15/2016) Diagnostic left-sided L4-5 lumbar epidural steroid injection Diagnostic bilateral sacroiliac joint block Palliative bilateral sacroiliac joint radiofrequency ablation (Left side done on 03/30/2016; right on 06/15/2016)   Palliative PRN treatment(s):   Palliativebilateral intra-articular knee injection Palliativebilateral genicular nerveblock Palliativebilateral lumbar facetblock Palliativeleft-sided L4-5 lumbar epidural steroid injection Palliativebilateral sacroiliac jointblock   Provider-requested follow-up: Return for Med-Mgmt, by NP, in addition, procedure (w/ sedation), (ASAP), w/ MD.  Future Appointments Date Time Provider [place]  08/03/2016 8:15 AM Milinda Pointer, MD [facility] None  08/26/2016 9:30 AM Cassandria Anger, MD [facility] None  09/29/2016 1:00 PM Milinda Pointer, [place] None   Primary Care Physician: [facility] Location: [facility] Outpatient Pain Management Facility Note by: Gaspar Cola, MD Date: 07/28/2016; Time: 1:55 PM  Patient Instructions    ____________________________________________________________________________________________  Preparing for Procedure with Sedation Instructions: . Oral Intake: Do not eat or drink anything for at least 8 hours prior to your procedure. . Transportation: Public transportation is not allowed. Bring an adult driver. The driver must be physically present in our waiting room before any procedure can be started. Marland Kitchen Physical Assistance: Bring an adult physically capable of assisting you, in the event you need help. This adult should keep you company at home for at least 6 hours after the procedure. . Blood Pressure Medicine: Take your blood pressure medicine with a sip of water the morning of the procedure. . Blood thinners:  . Diabetics on insulin: Notify the staff so that you can be scheduled 1st case in the morning. If your diabetes requires high dose insulin, take only  of your normal insulin dose the morning of the procedure and notify the staff that you have done so. . Preventing infections: Shower with an antibacterial soap the morning of your procedure. . Build-up your immune system: Take 1000 mg of Vitamin C with every meal (3 times a day) the day prior to your procedure. Marland Kitchen Antibiotics: Inform the staff if you have a condition or reason that requires you to take antibiotics before dental procedures. . Pregnancy: If you are pregnant, call and cancel the procedure. . Sickness: If you have a cold, fever, or any active infections, call and cancel the procedure. . Arrival: You must be in the facility at least 30 minutes prior to your scheduled procedure. . Children: Do not bring children with you. Percell Miller appropriately:  Bring dark clothing that you would not mind if they get stained. . Valuables: Do not bring any jewelry or valuables. Procedure appointments are reserved for interventional treatments only. Marland Kitchen No Prescription Refills. . No medication changes will be discussed during procedure  appointments. . No disability issues will be discussed. ____________________________________________________________________________________________  Pain Management Discharge Instructions  General Discharge Instructions :  If you need to reach your doctor call: Monday-Friday 8:00 am - 4:00 pm at [phone] or toll free [phone].  After clinic hours [phone] to have operator reach doctor.  Bring all of your medication bottles to all your appointments in the pain clinic.  To cancel or reschedule your appointment with Pain Management please remember to call 24 hours in advance to avoid a fee.  Refer to the educational materials which you have been given on: General Risks, I had my Procedure. Discharge Instructions, Post Sedation.  Post Procedure Instructions:  The drugs you were given will stay in your system until tomorrow, so for the next 24 hours you should not drive, make any legal decisions or drink any alcoholic beverages.  You may eat anything you prefer, but it is better to start with liquids then soups and crackers, and gradually work up to solid foods.  Please notify your doctor immediately if you have any unusual bleeding, trouble breathing or pain that is not related to your normal pain.  Depending on the type of procedure that was done, some parts of your body may feel week and/or numb.  This usually clears up by tonight or the next day.  Walk with the use of an assistive device or accompanied by an adult for the 24 hours.  You may use ice on the affected area for the first 24 hours.  Put ice in a Ziploc bag and cover with a towel and place against area 15 minutes on 15 minutes off.  You may switch to heat after 24 hours.GENERAL RISKS AND COMPLICATIONS  What are the risk, side effects and possible complications? Generally speaking, most procedures are safe.  However, with any procedure there are risks, side effects, and the possibility of complications.  The  risks and complications are dependent upon the sites that are lesioned, or the type of nerve block to be performed.  The closer the procedure is to the spine, the more serious the risks are.  Great care is taken when placing the radio frequency needles, block needles or lesioning probes, but sometimes complications can occur. 1. Infection: Any time there is an injection through the skin, there is a risk of infection.  This is why sterile conditions are used for these blocks.  There are four possible types of infection. 1. Localized skin infection. 2. Central Nervous System Infection-This can be in the form of Meningitis, which can be deadly. 3. Epidural Infections-This can be in the form of an epidural abscess, which can cause pressure inside of the spine, causing compression of the spinal cord with subsequent paralysis. This would require an emergency surgery to decompress, and there are no guarantees that the patient would recover from the paralysis. 4. Discitis-This is an infection of the intervertebral discs.  It occurs in about 1% of discography procedures.  It is difficult to treat and it may lead to surgery.        2. Pain: the needles have to go through skin and soft tissues, will cause soreness.       3. Damage to internal structures:  The nerves to be lesioned  may be near blood vessels or    other nerves which can be potentially damaged.       4. Bleeding: Bleeding is more common if the patient is taking blood thinners such as  aspirin, Coumadin, Ticiid, Plavix, etc., or if he/she have some genetic predisposition  such as hemophilia. Bleeding into the spinal canal can cause compression of the spinal  cord with subsequent paralysis.  This would require an emergency surgery to  decompress and there are no guarantees that the patient would recover from the  paralysis.       5. Pneumothorax:  Puncturing of a lung is a possibility, every time a needle is introduced in  the area of the chest or upper  back.  Pneumothorax refers to free air around the  collapsed lung(s), inside of the thoracic cavity (chest cavity).  Another two possible  complications related to a similar event would include: Hemothorax and Chylothorax.   These are variations of the Pneumothorax, where instead of air around the collapsed  lung(s), you may have blood or chyle, respectively.       6. Spinal headaches: They may occur with any procedures in the area of the spine.       7. Persistent CSF (Cerebro-Spinal Fluid) leakage: This is a rare problem, but may occur  with prolonged intrathecal or epidural catheters either due to the formation of a fistulous  track or a dural tear.       8. Nerve damage: By working so close to the spinal cord, there is always a possibility of  nerve damage, which could be as serious as a permanent spinal cord injury with  paralysis.       9. Death:  Although rare, severe deadly allergic reactions known as "Anaphylactic  reaction" can occur to any of the medications used.      10. Worsening of the symptoms:  We can always make thing worse.  What are the chances of something like this happening? Chances of any of this occuring are extremely low.  By statistics, you have more of a chance of getting killed in a motor vehicle accident: while driving to the hospital than any of the above occurring .  Nevertheless, you should be aware that they are possibilities.  In general, it is similar to taking a shower.  Everybody knows that you can slip, hit your head and get killed.  Does that mean that you should not shower again?  Nevertheless always keep in mind that statistics do not mean anything if you happen to be on the wrong side of them.  Even if a procedure has a 1 (one) in a 1,000,000 (million) chance of going wrong, it you happen to be that one..Also, keep in mind that by statistics, you have more of a chance of having something go wrong when taking medications.  Who should not have this procedure? If  you are on a blood thinning medication (e.g. Coumadin, Plavix, see list of "Blood Thinners"), or if you have an active infection going on, you should not have the procedure.  If you are taking any blood thinners, please inform your physician.  How should I prepare for this procedure?  Do not eat or drink anything at least six hours prior to the procedure.  Bring a driver with you .  It cannot be a taxi.  Come accompanied by an adult that can drive you back, and that is strong enough to help you if your legs get  weak or numb from the local anesthetic.  Take all of your medicines the morning of the procedure with just enough water to swallow them.  If you have diabetes, make sure that you are scheduled to have your procedure done first thing in the morning, whenever possible.  If you have diabetes, take only half of your insulin dose and notify our nurse that you have done so as soon as you arrive at the clinic.  If you are diabetic, but only take blood sugar pills (oral hypoglycemic), then do not take them on the morning of your procedure.  You may take them after you have had the procedure.  Do not take aspirin or any aspirin-containing medications, at least eleven (11) days prior to the procedure.  They may prolong bleeding.  Wear loose fitting clothing that may be easy to take off and that you would not mind if it got stained with Betadine or blood.  Do not wear any jewelry or perfume  Remove any nail coloring.  It will interfere with some of our monitoring equipment.  NOTE: Remember that this is not meant to be interpreted as a complete list of all possible complications.  Unforeseen problems may occur.  BLOOD THINNERS The following drugs contain aspirin or other products, which can cause increased bleeding during surgery and should not be taken for 2 weeks prior to and 1 week after surgery.  If you should need take something for relief of minor pain, you may take acetaminophen which  is found in Tylenol,m Datril, Anacin-3 and Panadol. It is not blood thinner. The products listed below are.  Do not take any of the products listed below in addition to any listed on your instruction sheet.  A.P.C or A.P.C with Codeine Codeine Phosphate Capsules #3 Ibuprofen Ridaura  ABC compound Congesprin Imuran rimadil  Advil Cope Indocin Robaxisal  Alka-Seltzer Effervescent Pain Reliever and Antacid Coricidin or Coricidin-D  Indomethacin Rufen  Alka-Seltzer plus Cold Medicine Cosprin Ketoprofen S-A-C Tablets  Anacin Analgesic Tablets or Capsules Coumadin Korlgesic Salflex  Anacin Extra Strength Analgesic tablets or capsules CP-2 Tablets Lanoril Salicylate  Anaprox Cuprimine Capsules Levenox Salocol  Anexsia-D Dalteparin Magan Salsalate  Anodynos Darvon compound Magnesium Salicylate Sine-off  Ansaid Dasin Capsules Magsal Sodium Salicylate  Anturane Depen Capsules Marnal Soma  APF Arthritis pain formula Dewitt's Pills Measurin Stanback  Argesic Dia-Gesic Meclofenamic Sulfinpyrazone  Arthritis Bayer Timed Release Aspirin Diclofenac Meclomen Sulindac  Arthritis pain formula Anacin Dicumarol Medipren Supac  Analgesic (Safety coated) Arthralgen Diffunasal Mefanamic Suprofen  Arthritis Strength Bufferin Dihydrocodeine Mepro Compound Suprol  Arthropan liquid Dopirydamole Methcarbomol with Aspirin Synalgos  ASA tablets/Enseals Disalcid Micrainin Tagament  Ascriptin Doan's Midol Talwin  Ascriptin A/D Dolene Mobidin Tanderil  Ascriptin Extra Strength Dolobid Moblgesic Ticlid  Ascriptin with Codeine Doloprin or Doloprin with Codeine Momentum Tolectin  Asperbuf Duoprin Mono-gesic Trendar  Aspergum Duradyne Motrin or Motrin IB Triminicin  Aspirin plain, buffered or enteric coated Durasal Myochrisine Trigesic  Aspirin Suppositories Easprin Nalfon Trillsate  Aspirin with Codeine Ecotrin Regular or Extra Strength Naprosyn Uracel  Atromid-S Efficin Naproxen Ursinus  Auranofin Capsules Elmiron  Neocylate Vanquish  Axotal Emagrin Norgesic Verin  Azathioprine Empirin or Empirin with Codeine Normiflo Vitamin E  Azolid Emprazil Nuprin Voltaren  Bayer Aspirin plain, buffered or children's or timed BC Tablets or powders Encaprin Orgaran Warfarin Sodium  Buff-a-Comp Enoxaparin Orudis Zorpin  Buff-a-Comp with Codeine Equegesic Os-Cal-Gesic   Buffaprin Excedrin plain, buffered or Extra Strength Oxalid   Bufferin Arthritis Strength Feldene Oxphenbutazone  Bufferin plain or Extra Strength Feldene Capsules Oxycodone with Aspirin   Bufferin with Codeine Fenoprofen Fenoprofen Pabalate or Pabalate-SF   Buffets II Flogesic Panagesic   Buffinol plain or Extra Strength Florinal or Florinal with Codeine Panwarfarin   Buf-Tabs Flurbiprofen Penicillamine   Butalbital Compound Four-way cold tablets Penicillin   Butazolidin Fragmin Pepto-Bismol   Carbenicillin Geminisyn Percodan   Carna Arthritis Reliever Geopen Persantine   Carprofen Gold's salt Persistin   Chloramphenicol Goody's Phenylbutazone   Chloromycetin Haltrain Piroxlcam   Clmetidine heparin Plaquenil   Cllnoril Hyco-pap Ponstel   Clofibrate Hydroxy chloroquine Propoxyphen         Before stopping any of these medications, be sure to consult the physician who ordered them.  Some, such as Coumadin (Warfarin) are ordered to prevent or treat serious conditions such as "deep thrombosis", "pumonary embolisms", and other heart problems.  The amount of time that you may need off of the medication may also vary with the medication and the reason for which you were taking it.  If you are taking any of these medications, please make sure you notify your pain physician before you undergo any procedures.          Epidural Steroid Injection An epidural steroid injection is a shot of steroid medicine and numbing medicine that is given into the space between the spinal cord and the bones in your back (epidural space). The shot helps relieve pain  caused by an irritated or swollen nerve root. The amount of pain relief you get from the injection depends on what is causing the nerve to be swollen and irritated, and how long your pain lasts. You are more likely to benefit from this injection if your pain is strong and comes on suddenly rather than if you have had pain for a long time. Tell a health care provider about:  Any allergies you have.  All medicines you are taking, including vitamins, herbs, eye drops, creams, and over-the-counter medicines.  Any problems you or family members have had with anesthetic medicines.  Any blood disorders you have.  Any surgeries you have had.  Any medical conditions you have.  Whether you are pregnant or may be pregnant. What are the risks? Generally, this is a safe procedure. However, problems may occur, including:  Headache.  Bleeding.  Infection.  Allergic reaction to medicines.  Damage to your nerves.  What happens before the procedure? Staying hydrated Follow instructions from your health care provider about hydration, which may include:  Up to 2 hours before the procedure - you may continue to drink clear liquids, such as water, clear fruit juice, black coffee, and plain tea.  Eating and drinking restrictions Follow instructions from your health care provider about eating and drinking, which may include:  8 hours before the procedure - stop eating heavy meals or foods such as meat, fried foods, or fatty foods.  6 hours before the procedure - stop eating light meals or foods, such as toast or cereal.  6 hours before the procedure - stop drinking milk or drinks that contain milk.  2 hours before the procedure - stop drinking clear liquids.  Medicine  You may be given medicines to lower anxiety.  Ask your health care provider about: ? Changing or stopping your regular medicines. This is especially important if you are taking diabetes medicines or blood  thinners. ? Taking medicines such as aspirin and ibuprofen. These medicines can thin your blood. Do not take these medicines before your procedure  if your health care provider instructs you not to. General instructions  Plan to have someone take you home from the hospital or clinic. What happens during the procedure?  You may receive a medicine to help you relax (sedative).  You will be asked to lie on your abdomen.  The injection site will be cleaned.  A numbing medicine (local anesthetic) will be used to numb the injection site.  A needle will be inserted through your skin into the epidural space. You may feel some discomfort when this happens. An X-ray machine will be used to make sure the needle is put as close as possible to the affected nerve.  A steroid medicine and a local anesthetic will be injected into the epidural space.  The needle will be removed.  A bandage (dressing) will be put over the injection site. What happens after the procedure?  Your blood pressure, heart rate, breathing rate, and blood oxygen level will be monitored until the medicines you were given have worn off.  Your arm or leg may feel weak or numb for a few hours.  The injection site may feel sore.  Do not drive for 24 hours if you received a sedative. This information is not intended to replace advice given to you by your health care provider. Make sure you discuss any questions you have with your health care provider. Document Released: 04/07/2007 Document Revised: 06/12/2015 Document Reviewed: 04/16/2015 [employer] Interactive Patient Education  2017 [employer].

## 2016-07-28 ENCOUNTER — Ambulatory Visit: Payer: Medicare Other | Attending: Pain Medicine | Admitting: Pain Medicine

## 2016-07-28 ENCOUNTER — Encounter: Payer: Self-pay | Admitting: Pain Medicine

## 2016-07-28 VITALS — BP 146/76 | HR 82 | Temp 96.7°F | Resp 16 | Ht 69.5 in | Wt 239.0 lb

## 2016-07-28 DIAGNOSIS — Z87891 Personal history of nicotine dependence: Secondary | ICD-10-CM | POA: Insufficient documentation

## 2016-07-28 DIAGNOSIS — M79605 Pain in left leg: Secondary | ICD-10-CM | POA: Diagnosis not present

## 2016-07-28 DIAGNOSIS — G8929 Other chronic pain: Secondary | ICD-10-CM | POA: Diagnosis not present

## 2016-07-28 DIAGNOSIS — M79672 Pain in left foot: Secondary | ICD-10-CM | POA: Insufficient documentation

## 2016-07-28 DIAGNOSIS — Z79891 Long term (current) use of opiate analgesic: Secondary | ICD-10-CM | POA: Diagnosis not present

## 2016-07-28 DIAGNOSIS — M47896 Other spondylosis, lumbar region: Secondary | ICD-10-CM | POA: Insufficient documentation

## 2016-07-28 DIAGNOSIS — E1142 Type 2 diabetes mellitus with diabetic polyneuropathy: Secondary | ICD-10-CM | POA: Insufficient documentation

## 2016-07-28 DIAGNOSIS — E1165 Type 2 diabetes mellitus with hyperglycemia: Secondary | ICD-10-CM | POA: Diagnosis not present

## 2016-07-28 DIAGNOSIS — R6510 Systemic inflammatory response syndrome (SIRS) of non-infectious origin without acute organ dysfunction: Secondary | ICD-10-CM | POA: Insufficient documentation

## 2016-07-28 DIAGNOSIS — E785 Hyperlipidemia, unspecified: Secondary | ICD-10-CM | POA: Diagnosis not present

## 2016-07-28 DIAGNOSIS — Z6834 Body mass index (BMI) 34.0-34.9, adult: Secondary | ICD-10-CM | POA: Insufficient documentation

## 2016-07-28 DIAGNOSIS — K219 Gastro-esophageal reflux disease without esophagitis: Secondary | ICD-10-CM | POA: Diagnosis not present

## 2016-07-28 DIAGNOSIS — I252 Old myocardial infarction: Secondary | ICD-10-CM | POA: Diagnosis not present

## 2016-07-28 DIAGNOSIS — I5022 Chronic systolic (congestive) heart failure: Secondary | ICD-10-CM | POA: Insufficient documentation

## 2016-07-28 DIAGNOSIS — I110 Hypertensive heart disease with heart failure: Secondary | ICD-10-CM | POA: Insufficient documentation

## 2016-07-28 DIAGNOSIS — M5416 Radiculopathy, lumbar region: Secondary | ICD-10-CM | POA: Diagnosis not present

## 2016-07-28 DIAGNOSIS — E669 Obesity, unspecified: Secondary | ICD-10-CM | POA: Insufficient documentation

## 2016-07-28 DIAGNOSIS — Z794 Long term (current) use of insulin: Secondary | ICD-10-CM | POA: Insufficient documentation

## 2016-07-28 DIAGNOSIS — Z5181 Encounter for therapeutic drug level monitoring: Secondary | ICD-10-CM | POA: Diagnosis not present

## 2016-07-28 DIAGNOSIS — M79604 Pain in right leg: Secondary | ICD-10-CM | POA: Diagnosis not present

## 2016-07-28 DIAGNOSIS — I2510 Atherosclerotic heart disease of native coronary artery without angina pectoris: Secondary | ICD-10-CM | POA: Diagnosis not present

## 2016-07-28 DIAGNOSIS — M170 Bilateral primary osteoarthritis of knee: Secondary | ICD-10-CM | POA: Diagnosis not present

## 2016-07-28 DIAGNOSIS — Z79899 Other long term (current) drug therapy: Secondary | ICD-10-CM | POA: Insufficient documentation

## 2016-07-28 DIAGNOSIS — M79671 Pain in right foot: Secondary | ICD-10-CM | POA: Insufficient documentation

## 2016-07-28 DIAGNOSIS — G894 Chronic pain syndrome: Secondary | ICD-10-CM

## 2016-07-28 DIAGNOSIS — F419 Anxiety disorder, unspecified: Secondary | ICD-10-CM | POA: Insufficient documentation

## 2016-07-28 DIAGNOSIS — Z951 Presence of aortocoronary bypass graft: Secondary | ICD-10-CM | POA: Insufficient documentation

## 2016-07-28 DIAGNOSIS — Z7982 Long term (current) use of aspirin: Secondary | ICD-10-CM | POA: Insufficient documentation

## 2016-07-28 DIAGNOSIS — J449 Chronic obstructive pulmonary disease, unspecified: Secondary | ICD-10-CM | POA: Diagnosis not present

## 2016-07-28 DIAGNOSIS — M533 Sacrococcygeal disorders, not elsewhere classified: Secondary | ICD-10-CM | POA: Diagnosis not present

## 2016-07-28 NOTE — Patient Instructions (Addendum)
____________________________________________________________________________________________  Preparing for Procedure with Sedation Instructions: . Oral Intake: Do not eat or drink anything for at least 8 hours prior to your procedure. . Transportation: Public transportation is not allowed. Bring an adult driver. The driver must be physically present in our waiting room before any procedure can be started. Marland Kitchen Physical Assistance: Bring an adult physically capable of assisting you, in the event you need help. This adult should keep you company at home for at least 6 hours after the procedure. . Blood Pressure Medicine: Take your blood pressure medicine with a sip of water the morning of the procedure. . Blood thinners:  . Diabetics on insulin: Notify the staff so that you can be scheduled 1st case in the morning. If your diabetes requires high dose insulin, take only  of your normal insulin dose the morning of the procedure and notify the staff that you have done so. . Preventing infections: Shower with an antibacterial soap the morning of your procedure. . Build-up your immune system: Take 1000 mg of Vitamin C with every meal (3 times a day) the day prior to your procedure. Marland Kitchen Antibiotics: Inform the staff if you have a condition or reason that requires you to take antibiotics before dental procedures. . Pregnancy: If you are pregnant, call and cancel the procedure. . Sickness: If you have a cold, fever, or any active infections, call and cancel the procedure. . Arrival: You must be in the facility at least 30 minutes prior to your scheduled procedure. . Children: Do not bring children with you. . Dress appropriately: Bring dark clothing that you would not mind if they get stained. . Valuables: Do not bring any jewelry or valuables. Procedure appointments are reserved for interventional treatments only. Marland Kitchen No Prescription Refills. . No medication changes will be discussed during procedure  appointments. . No disability issues will be discussed. ____________________________________________________________________________________________  Pain Management Discharge Instructions  General Discharge Instructions :  If you need to reach your doctor call: Monday-Friday 8:00 am - 4:00 pm at [phone] or toll free [phone].  After clinic hours [phone] to have operator reach doctor.  Bring all of your medication bottles to all your appointments in the pain clinic.  To cancel or reschedule your appointment with Pain Management please remember to call 24 hours in advance to avoid a fee.  Refer to the educational materials which you have been given on: General Risks, I had my Procedure. Discharge Instructions, Post Sedation.  Post Procedure Instructions:  The drugs you were given will stay in your system until tomorrow, so for the next 24 hours you should not drive, make any legal decisions or drink any alcoholic beverages.  You may eat anything you prefer, but it is better to start with liquids then soups and crackers, and gradually work up to solid foods.  Please notify your doctor immediately if you have any unusual bleeding, trouble breathing or pain that is not related to your normal pain.  Depending on the type of procedure that was done, some parts of your body may feel week and/or numb.  This usually clears up by tonight or the next day.  Walk with the use of an assistive device or accompanied by an adult for the 24 hours.  You may use ice on the affected area for the first 24 hours.  Put ice in a Ziploc bag and cover with a towel and place against area 15 minutes on 15 minutes off.  You may switch to heat after  24 hours.GENERAL RISKS AND COMPLICATIONS  What are the risk, side effects and possible complications? Generally speaking, most procedures are safe.  However, with any procedure there are risks, side effects, and the possibility of complications.  The  risks and complications are dependent upon the sites that are lesioned, or the type of nerve block to be performed.  The closer the procedure is to the spine, the more serious the risks are.  Great care is taken when placing the radio frequency needles, block needles or lesioning probes, but sometimes complications can occur. 1. Infection: Any time there is an injection through the skin, there is a risk of infection.  This is why sterile conditions are used for these blocks.  There are four possible types of infection. 1. Localized skin infection. 2. Central Nervous System Infection-This can be in the form of Meningitis, which can be deadly. 3. Epidural Infections-This can be in the form of an epidural abscess, which can cause pressure inside of the spine, causing compression of the spinal cord with subsequent paralysis. This would require an emergency surgery to decompress, and there are no guarantees that the patient would recover from the paralysis. 4. Discitis-This is an infection of the intervertebral discs.  It occurs in about 1% of discography procedures.  It is difficult to treat and it may lead to surgery.        2. Pain: the needles have to go through skin and soft tissues, will cause soreness.       3. Damage to internal structures:  The nerves to be lesioned may be near blood vessels or    other nerves which can be potentially damaged.       4. Bleeding: Bleeding is more common if the patient is taking blood thinners such as  aspirin, Coumadin, Ticiid, Plavix, etc., or if he/she have some genetic predisposition  such as hemophilia. Bleeding into the spinal canal can cause compression of the spinal  cord with subsequent paralysis.  This would require an emergency surgery to  decompress and there are no guarantees that the patient would recover from the  paralysis.       5. Pneumothorax:  Puncturing of a lung is a possibility, every time a needle is introduced in  the area of the chest or upper  back.  Pneumothorax refers to free air around the  collapsed lung(s), inside of the thoracic cavity (chest cavity).  Another two possible  complications related to a similar event would include: Hemothorax and Chylothorax.   These are variations of the Pneumothorax, where instead of air around the collapsed  lung(s), you may have blood or chyle, respectively.       6. Spinal headaches: They may occur with any procedures in the area of the spine.       7. Persistent CSF (Cerebro-Spinal Fluid) leakage: This is a rare problem, but may occur  with prolonged intrathecal or epidural catheters either due to the formation of a fistulous  track or a dural tear.       8. Nerve damage: By working so close to the spinal cord, there is always a possibility of  nerve damage, which could be as serious as a permanent spinal cord injury with  paralysis.       9. Death:  Although rare, severe deadly allergic reactions known as "Anaphylactic  reaction" can occur to any of the medications used.      10. Worsening of the symptoms:  We can always make  thing worse.  What are the chances of something like this happening? Chances of any of this occuring are extremely low.  By statistics, you have more of a chance of getting killed in a motor vehicle accident: while driving to the hospital than any of the above occurring .  Nevertheless, you should be aware that they are possibilities.  In general, it is similar to taking a shower.  Everybody knows that you can slip, hit your head and get killed.  Does that mean that you should not shower again?  Nevertheless always keep in mind that statistics do not mean anything if you happen to be on the wrong side of them.  Even if a procedure has a 1 (one) in a 1,000,000 (million) chance of going wrong, it you happen to be that one..Also, keep in mind that by statistics, you have more of a chance of having something go wrong when taking medications.  Who should not have this procedure? If  you are on a blood thinning medication (e.g. Coumadin, Plavix, see list of "Blood Thinners"), or if you have an active infection going on, you should not have the procedure.  If you are taking any blood thinners, please inform your physician.  How should I prepare for this procedure?  Do not eat or drink anything at least six hours prior to the procedure.  Bring a driver with you .  It cannot be a taxi.  Come accompanied by an adult that can drive you back, and that is strong enough to help you if your legs get weak or numb from the local anesthetic.  Take all of your medicines the morning of the procedure with just enough water to swallow them.  If you have diabetes, make sure that you are scheduled to have your procedure done first thing in the morning, whenever possible.  If you have diabetes, take only half of your insulin dose and notify our nurse that you have done so as soon as you arrive at the clinic.  If you are diabetic, but only take blood sugar pills (oral hypoglycemic), then do not take them on the morning of your procedure.  You may take them after you have had the procedure.  Do not take aspirin or any aspirin-containing medications, at least eleven (11) days prior to the procedure.  They may prolong bleeding.  Wear loose fitting clothing that may be easy to take off and that you would not mind if it got stained with Betadine or blood.  Do not wear any jewelry or perfume  Remove any nail coloring.  It will interfere with some of our monitoring equipment.  NOTE: Remember that this is not meant to be interpreted as a complete list of all possible complications.  Unforeseen problems may occur.  BLOOD THINNERS The following drugs contain aspirin or other products, which can cause increased bleeding during surgery and should not be taken for 2 weeks prior to and 1 week after surgery.  If you should need take something for relief of minor pain, you may take acetaminophen which  is found in Tylenol,m Datril, Anacin-3 and Panadol. It is not blood thinner. The products listed below are.  Do not take any of the products listed below in addition to any listed on your instruction sheet.  A.P.C or A.P.C with Codeine Codeine Phosphate Capsules #3 Ibuprofen Ridaura  ABC compound Congesprin Imuran rimadil  Advil Cope Indocin Robaxisal  Alka-Seltzer Effervescent Pain Reliever and Antacid Coricidin or Coricidin-D  Indomethacin Rufen  Alka-Seltzer plus Cold Medicine Cosprin Ketoprofen S-A-C Tablets  Anacin Analgesic Tablets or Capsules Coumadin Korlgesic Salflex  Anacin Extra Strength Analgesic tablets or capsules CP-2 Tablets Lanoril Salicylate  Anaprox Cuprimine Capsules Levenox Salocol  Anexsia-D Dalteparin Magan Salsalate  Anodynos Darvon compound Magnesium Salicylate Sine-off  Ansaid Dasin Capsules Magsal Sodium Salicylate  Anturane Depen Capsules Marnal Soma  APF Arthritis pain formula Dewitt's Pills Measurin Stanback  Argesic Dia-Gesic Meclofenamic Sulfinpyrazone  Arthritis Bayer Timed Release Aspirin Diclofenac Meclomen Sulindac  Arthritis pain formula Anacin Dicumarol Medipren Supac  Analgesic (Safety coated) Arthralgen Diffunasal Mefanamic Suprofen  Arthritis Strength Bufferin Dihydrocodeine Mepro Compound Suprol  Arthropan liquid Dopirydamole Methcarbomol with Aspirin Synalgos  ASA tablets/Enseals Disalcid Micrainin Tagament  Ascriptin Doan's Midol Talwin  Ascriptin A/D Dolene Mobidin Tanderil  Ascriptin Extra Strength Dolobid Moblgesic Ticlid  Ascriptin with Codeine Doloprin or Doloprin with Codeine Momentum Tolectin  Asperbuf Duoprin Mono-gesic Trendar  Aspergum Duradyne Motrin or Motrin IB Triminicin  Aspirin plain, buffered or enteric coated Durasal Myochrisine Trigesic  Aspirin Suppositories Easprin Nalfon Trillsate  Aspirin with Codeine Ecotrin Regular or Extra Strength Naprosyn Uracel  Atromid-S Efficin Naproxen Ursinus  Auranofin Capsules Elmiron  Neocylate Vanquish  Axotal Emagrin Norgesic Verin  Azathioprine Empirin or Empirin with Codeine Normiflo Vitamin E  Azolid Emprazil Nuprin Voltaren  Bayer Aspirin plain, buffered or children's or timed BC Tablets or powders Encaprin Orgaran Warfarin Sodium  Buff-a-Comp Enoxaparin Orudis Zorpin  Buff-a-Comp with Codeine Equegesic Os-Cal-Gesic   Buffaprin Excedrin plain, buffered or Extra Strength Oxalid   Bufferin Arthritis Strength Feldene Oxphenbutazone   Bufferin plain or Extra Strength Feldene Capsules Oxycodone with Aspirin   Bufferin with Codeine Fenoprofen Fenoprofen Pabalate or Pabalate-SF   Buffets II Flogesic Panagesic   Buffinol plain or Extra Strength Florinal or Florinal with Codeine Panwarfarin   Buf-Tabs Flurbiprofen Penicillamine   Butalbital Compound Four-way cold tablets Penicillin   Butazolidin Fragmin Pepto-Bismol   Carbenicillin Geminisyn Percodan   Carna Arthritis Reliever Geopen Persantine   Carprofen Gold's salt Persistin   Chloramphenicol Goody's Phenylbutazone   Chloromycetin Haltrain Piroxlcam   Clmetidine heparin Plaquenil   Cllnoril Hyco-pap Ponstel   Clofibrate Hydroxy chloroquine Propoxyphen         Before stopping any of these medications, be sure to consult the physician who ordered them.  Some, such as Coumadin (Warfarin) are ordered to prevent or treat serious conditions such as "deep thrombosis", "pumonary embolisms", and other heart problems.  The amount of time that you may need off of the medication may also vary with the medication and the reason for which you were taking it.  If you are taking any of these medications, please make sure you notify your pain physician before you undergo any procedures.          Epidural Steroid Injection An epidural steroid injection is a shot of steroid medicine and numbing medicine that is given into the space between the spinal cord and the bones in your back (epidural space). The shot helps relieve pain  caused by an irritated or swollen nerve root. The amount of pain relief you get from the injection depends on what is causing the nerve to be swollen and irritated, and how long your pain lasts. You are more likely to benefit from this injection if your pain is strong and comes on suddenly rather than if you have had pain for a long time. Tell a health care provider about:  Any allergies you have.  All medicines you are  taking, including vitamins, herbs, eye drops, creams, and over-the-counter medicines.  Any problems you or family members have had with anesthetic medicines.  Any blood disorders you have.  Any surgeries you have had.  Any medical conditions you have.  Whether you are pregnant or may be pregnant. What are the risks? Generally, this is a safe procedure. However, problems may occur, including:  Headache.  Bleeding.  Infection.  Allergic reaction to medicines.  Damage to your nerves.  What happens before the procedure? Staying hydrated Follow instructions from your health care provider about hydration, which may include:  Up to 2 hours before the procedure - you may continue to drink clear liquids, such as water, clear fruit juice, black coffee, and plain tea.  Eating and drinking restrictions Follow instructions from your health care provider about eating and drinking, which may include:  8 hours before the procedure - stop eating heavy meals or foods such as meat, fried foods, or fatty foods.  6 hours before the procedure - stop eating light meals or foods, such as toast or cereal.  6 hours before the procedure - stop drinking milk or drinks that contain milk.  2 hours before the procedure - stop drinking clear liquids.  Medicine  You may be given medicines to lower anxiety.  Ask your health care provider about: ? Changing or stopping your regular medicines. This is especially important if you are taking diabetes medicines or blood  thinners. ? Taking medicines such as aspirin and ibuprofen. These medicines can thin your blood. Do not take these medicines before your procedure if your health care provider instructs you not to. General instructions  Plan to have someone take you home from the hospital or clinic. What happens during the procedure?  You may receive a medicine to help you relax (sedative).  You will be asked to lie on your abdomen.  The injection site will be cleaned.  A numbing medicine (local anesthetic) will be used to numb the injection site.  A needle will be inserted through your skin into the epidural space. You may feel some discomfort when this happens. An X-ray machine will be used to make sure the needle is put as close as possible to the affected nerve.  A steroid medicine and a local anesthetic will be injected into the epidural space.  The needle will be removed.  A bandage (dressing) will be put over the injection site. What happens after the procedure?  Your blood pressure, heart rate, breathing rate, and blood oxygen level will be monitored until the medicines you were given have worn off.  Your arm or leg may feel weak or numb for a few hours.  The injection site may feel sore.  Do not drive for 24 hours if you received a sedative. This information is not intended to replace advice given to you by your health care provider. Make sure you discuss any questions you have with your health care provider. Document Released: 04/07/2007 Document Revised: 06/12/2015 Document Reviewed: 04/16/2015 Elsevier Interactive Patient Education  2017 [employer].

## 2016-07-28 NOTE — Progress Notes (Signed)
Safety precautions to be maintained throughout the outpatient stay will include: orient to surroundings, keep bed in low position, maintain call bell within reach at all times, provide assistance with transfer out of bed and ambulation.  

## 2016-08-03 ENCOUNTER — Ambulatory Visit: Payer: Medicare Other | Admitting: Pain Medicine

## 2016-08-26 ENCOUNTER — Ambulatory Visit: Payer: Self-pay | Admitting: "Endocrinology

## 2016-09-16 ENCOUNTER — Ambulatory Visit: Payer: Self-pay | Admitting: "Endocrinology

## 2016-09-29 ENCOUNTER — Ambulatory Visit: Payer: Medicare Other | Attending: Pain Medicine | Admitting: Nurse Practitioner

## 2016-09-29 ENCOUNTER — Encounter: Payer: Self-pay | Admitting: Pain Medicine

## 2016-09-29 VITALS — BP 157/71 | HR 73 | Temp 98.3°F | Resp 18 | Ht 69.0 in | Wt 238.0 lb

## 2016-09-29 DIAGNOSIS — E669 Obesity, unspecified: Secondary | ICD-10-CM | POA: Insufficient documentation

## 2016-09-29 DIAGNOSIS — M79605 Pain in left leg: Secondary | ICD-10-CM | POA: Diagnosis not present

## 2016-09-29 DIAGNOSIS — M545 Low back pain, unspecified: Secondary | ICD-10-CM

## 2016-09-29 DIAGNOSIS — M25562 Pain in left knee: Secondary | ICD-10-CM | POA: Diagnosis present

## 2016-09-29 DIAGNOSIS — Z951 Presence of aortocoronary bypass graft: Secondary | ICD-10-CM | POA: Insufficient documentation

## 2016-09-29 DIAGNOSIS — K219 Gastro-esophageal reflux disease without esophagitis: Secondary | ICD-10-CM | POA: Insufficient documentation

## 2016-09-29 DIAGNOSIS — J439 Emphysema, unspecified: Secondary | ICD-10-CM | POA: Diagnosis not present

## 2016-09-29 DIAGNOSIS — M4722 Other spondylosis with radiculopathy, cervical region: Secondary | ICD-10-CM | POA: Diagnosis not present

## 2016-09-29 DIAGNOSIS — I252 Old myocardial infarction: Secondary | ICD-10-CM | POA: Diagnosis not present

## 2016-09-29 DIAGNOSIS — E785 Hyperlipidemia, unspecified: Secondary | ICD-10-CM | POA: Diagnosis not present

## 2016-09-29 DIAGNOSIS — F419 Anxiety disorder, unspecified: Secondary | ICD-10-CM | POA: Diagnosis not present

## 2016-09-29 DIAGNOSIS — M79604 Pain in right leg: Secondary | ICD-10-CM | POA: Diagnosis not present

## 2016-09-29 DIAGNOSIS — G894 Chronic pain syndrome: Secondary | ICD-10-CM

## 2016-09-29 DIAGNOSIS — I110 Hypertensive heart disease with heart failure: Secondary | ICD-10-CM | POA: Insufficient documentation

## 2016-09-29 DIAGNOSIS — E1165 Type 2 diabetes mellitus with hyperglycemia: Secondary | ICD-10-CM | POA: Diagnosis not present

## 2016-09-29 DIAGNOSIS — R911 Solitary pulmonary nodule: Secondary | ICD-10-CM | POA: Diagnosis not present

## 2016-09-29 DIAGNOSIS — M5416 Radiculopathy, lumbar region: Secondary | ICD-10-CM | POA: Insufficient documentation

## 2016-09-29 DIAGNOSIS — G8929 Other chronic pain: Secondary | ICD-10-CM

## 2016-09-29 DIAGNOSIS — I2510 Atherosclerotic heart disease of native coronary artery without angina pectoris: Secondary | ICD-10-CM | POA: Insufficient documentation

## 2016-09-29 DIAGNOSIS — I5022 Chronic systolic (congestive) heart failure: Secondary | ICD-10-CM | POA: Insufficient documentation

## 2016-09-29 MED ORDER — OXYCODONE HCL 15 MG PO TABS: 15.0000 mg | ORAL_TABLET | Freq: Four times a day (QID) | ORAL | 0 refills | Status: DC | PRN

## 2016-09-29 NOTE — Patient Instructions (Addendum)
____________________________________________________________________________________________  Medication Rules  Applies to: All patients receiving prescriptions (written or electronic).  Pharmacy of record: Pharmacy where electronic prescriptions will be sent. If written prescriptions are taken to a different pharmacy, please inform the nursing staff. The pharmacy listed in the electronic medical record should be the one where you would like electronic prescriptions to be sent.  Prescription refills: Only during scheduled appointments. Applies to both, written and electronic prescriptions.  NOTE: The following applies primarily to controlled substances (Opioid* Pain Medications).   Patient's responsibilities: 1. Pain Pills: Bring all pain pills to every appointment (except for procedure appointments). 2. Pill Bottles: Bring pills in original pharmacy bottle. Always bring newest bottle. Bring bottle, even if empty. 3. Medication refills: You are responsible for knowing and keeping track of what medications you need refilled. The day before your appointment, write a list of all prescriptions that need to be refilled. Bring that list to your appointment and give it to the admitting nurse. Prescriptions will be written only during appointments. If you forget a medication, it will not be "Called in", "Faxed", or "electronically sent". You will need to get another appointment to get these prescribed. 4. Prescription Accuracy: You are responsible for carefully inspecting your prescriptions before leaving our office. Have the discharge nurse carefully go over each prescription with you, before taking them home. Make sure that your name is accurately spelled, that your address is correct. Check the name and dose of your medication to make sure it is accurate. Check the number of pills, and the written instructions to make sure they are clear and accurate. Make sure that you are given enough medication to  last until your next medication refill appointment. 5. Taking Medication: Take medication as prescribed. Never take more pills than instructed. Never take medication more frequently than prescribed. Taking less pills or less frequently is permitted and encouraged, when it comes to controlled substances (written prescriptions).  6. Inform other Doctors: Always inform, all of your healthcare providers, of all the medications you take. 7. Pain Medication from other Providers: You are not allowed to accept any additional pain medication from any other Doctor or Healthcare provider. There are two exceptions to this rule. (see below) In the event that you require additional pain medication, you are responsible for notifying us, as stated below. 8. Medication Agreement: You are responsible for carefully reading and following our Medication Agreement. This must be signed before receiving any prescriptions from our practice. Safely store a copy of your signed Agreement. Violations to the Agreement will result in no further prescriptions. (Additional copies of our Medication Agreement are available upon request.) 9. Laws, Rules, & Regulations: All patients are expected to follow all Federal and State Laws, Statutes, Rules, & Regulations. Ignorance of the Laws does not constitute a valid excuse. The use of any illegal substances is prohibited. 10. Adopted CDC guidelines & recommendations: Target dosing levels will be at or below 60 MME/day. Use of benzodiazepines** is not recommended.  Exceptions: There are only two exceptions to the rule of not receiving pain medications from other Healthcare Providers. 1. Exception #1 (Emergencies): In the event of an emergency (i.e.: accident requiring emergency care), you are allowed to receive additional pain medication. However, you are responsible for: As soon as you are able, call our office [phone], at any time of the day or night, and leave a message stating your  name, the date and nature of the emergency, and the name and dose of the medication   prescribed. In the event that your call is answered by a member of our staff, make sure to document and save the date, time, and the name of the person that took your information.  2. Exception #2 (Planned Surgery): In the event that you are scheduled by another doctor or dentist to have any type of surgery or procedure, you are allowed (for a period no longer than 30 days), to receive additional pain medication, for the acute post-op pain. However, in this case, you are responsible for picking up a copy of our "Post-op Pain Management for Surgeons" handout, and giving it to your surgeon or dentist. This document is available at our office, and does not require an appointment to obtain it. Simply go to our office during business hours (Monday-Thursday from 8:00 AM to 4:00 PM) (Friday 8:00 AM to 12:00 Noon) or if you have a scheduled appointment with [place], prior to your surgery, and ask for it by name. In addition, you will need to provide [place] with your name, name of your surgeon, type of surgery, and date of procedure or surgery.  *Opioid medications include: morphine, codeine, oxycodone, oxymorphone, hydrocodone, hydromorphone, meperidine, tramadol, tapentadol, buprenorphine, fentanyl, methadone. **Benzodiazepine medications include: diazepam (Valium), alprazolam (Xanax), clonazepam (Klonopine), lorazepam (Ativan), clorazepate (Tranxene), chlordiazepoxide (Librium), estazolam (Prosom), oxazepam (Serax), temazepam (Restoril), triazolam (Halcion)  ____________________________________________________________________________________________  BMI Assessment: Estimated body mass index is 35.15 kg/m as calculated from the following:   Height as of this encounter: 5\' 9"  (1.753 m).   Weight as of this encounter: 238 lb (108 kg).  BMI interpretation table: BMI level Category Range association with higher incidence of chronic pain   <18 kg/m2 Underweight   18.5-24.9 kg/m2 Ideal body weight   25-29.9 kg/m2 Overweight Increased incidence by 20%  30-34.9 kg/m2 Obese (Class I) Increased incidence by 68%  35-39.9 kg/m2 Severe obesity (Class II) Increased incidence by 136%  >40 kg/m2 Extreme obesity (Class III) Increased incidence by 254%   BMI Readings from Last 4 Encounters:  09/29/16 35.15 kg/m  07/28/16 34.79 kg/m  06/15/16 35.29 kg/m  06/04/16 35.88 kg/m   Wt Readings from Last 4 Encounters:  09/29/16 238 lb (108 kg)  07/28/16 239 lb (108.4 kg)  06/15/16 239 lb (108.4 kg)  06/04/16 243 lb (110.2 kg)  .dwpre You were given 3 prescriptions Oxycodone today.

## 2016-09-29 NOTE — Progress Notes (Signed)
Patient's Name: Manuel Knight  MRN: [id number]  Referring Provider: [facility]  DOB: May 08, 1960  PCP: [facility]  DOS: 09/29/2016  Note by: Vevelyn Francois NP  Service setting: Ambulatory outpatient  Specialty: Interventional Pain Management  Location: [facility] (AMB) Pain Management Facility    Patient type: Established    Primary Reason(s) for Visit: Encounter for prescription drug management. (Level of risk: moderate)  CC: Back Pain (lower) and Knee Pain (left)  HPI  [name] is a 56 y.o. year old, male patient, who comes today for a medication management evaluation. He has Type 2 diabetes mellitus with hyperglycemia, with long-term current use of insulin ([place]); HLD (hyperlipidemia); Obesity, unspecified; Anxiety state; Essential hypertension; COPD with emphysema ([place]); PULMONARY NODULE; HERNIA; GERD (gastroesophageal reflux disease); Esophageal dysphagia; NSTEMI (non-ST elevated myocardial infarction) ([place]); CAD (coronary artery disease), native coronary artery; S/P CABG x 3; Chronic systolic heart failure ([place]); Emphysema of lung ([place]); Lumbar spondylosis with lumbar radiculitis (Left); H/O coronary artery bypass surgery; Encounter for pain management planning; Encounter for therapeutic drug level monitoring; Opiate use (90 MME/Day); Chronic low back pain (Location of Secondary source of pain) (Bilateral) (L>R); Chronic knee pain (Location of Primary Source of Pain) (Bilateral) (L>R); Chronic foot pain (Bilateral) (L>R); Disturbance of skin sensation; Diabetic peripheral neuropathy (HCC) (lower extremity); Chronic lumbar radicular pain (L5 dermatome) (Right); Chronic sacroiliac joint pain (Bilateral) (L>R); Osteoarthritis of knee (Bilateral) (L>R); Lumbar facet syndrome (Bilateral) (L>R); Lumbar facet arthropathy ([place]); Chronic lower extremity pain (Location of Tertiary source of pain) (Bilateral) (R>L); History of marijuana use; Hypomagnesemia; Vitamin D  insufficiency; Long term current use of opiate analgesic; Long term prescription opiate use; Chronic pain syndrome; SIRS (systemic inflammatory response syndrome) (HCC); and Spondylosis of lumbar spine on his problem list. His primarily concern today is the Back Pain (lower) and Knee Pain (left)  Pain Assessment: Location: Lower Back Radiating: left leg, to the foot Onset: More than a month ago Duration: Chronic pain Quality: Constant Severity: 2 /10 (self-reported pain score)  Note: Reported level is compatible with observation.                   Effect on ADL:   Timing: Constant Modifying factors: medications  [name] was last scheduled for an appointment on Visit date not found for medication management. During today's appointment we reviewed [name]'s chronic pain status, as well as his outpatient medication regimen. He admits that he continues to have pain after his right RFA. He admits that it took a long time for the previous left RFA to heal. He states that he is not able to sleep on that side. He states that he did not realize it would be this painful. He continues to have the left sided numbness and tingling into his foot.   The patient  reports that he does not use drugs. His body mass index is 35.15 kg/m.  Further details on both, my assessment(s), as well as the proposed treatment plan, please see below.  Controlled Substance Pharmacotherapy Assessment REMS (Risk Evaluation and Mitigation Strategy)  Analgesic:Oxycodone IR 15 mg every 6 hours (60 mg/day) MME/day:90 mg/day. [name], RN  09/29/2016  1:40 PM  Sign at close encounter Nursing Pain Medication Assessment:  Safety precautions to be maintained throughout the outpatient stay will include: orient to surroundings, keep bed in low position, maintain call bell within reach at all times, provide assistance with transfer out of bed and ambulation.  Medication  Inspection Compliance: Pill count conducted under  aseptic conditions, in front of the patient. Neither the pills nor the bottle was removed from the patient's sight at any time. Once count was completed pills were immediately returned to the patient in their original bottle.  Medication: Oxycodone IR Pill/Patch Count: 34. of 120 pills remain Pill/Patch Appearance: Markings consistent with prescribed medication Bottle Appearance: Standard pharmacy container. Clearly labeled. Filled Date: 08/25 / 2018 Last Medication intake:  Today   Pharmacokinetics: Liberation and absorption (onset of action): WNL Distribution (time to peak effect): WNL Metabolism and excretion (duration of action): WNL         Pharmacodynamics: Desired effects: Analgesia: [name] reports >50% benefit. Functional ability: Patient reports that medication allows him to accomplish basic ADLs Clinically meaningful improvement in function (CMIF): Sustained CMIF goals met Perceived effectiveness: Described as relatively effective, allowing for increase in activities of daily living (ADL) Undesirable effects: Side-effects or Adverse reactions: None reported Monitoring: [place] PMP: Online review of the past 27-monthperiod conducted. Compliant with practice rules and regulations List of all UDS test(s) done:  Lab Results  Component Value Date   TOXASSSELUR FINAL 08/27/2015   SUMMARY FINAL 06/04/2016   SUMMARY FINAL 06/13/2015   Last UDS on record: [employer] Select 13  Date Value Ref Range Status  08/27/2015 FINAL  Final    Comment:    ==================================================================== TOXASSURE SELECT 13 (MW) ==================================================================== Test                             Result       Flag       Units Drug Present and Declared for Prescription Verification   Alprazolam                     128          EXPECTED   ng/mg creat   Alpha-hydroxyalprazolam        63           EXPECTED   ng/mg creat    Source of  alprazolam is a scheduled prescription medication.    Alpha-hydroxyalprazolam is an expected metabolite of alprazolam.   Oxycodone                      1064         EXPECTED   ng/mg creat   Oxymorphone                    107          EXPECTED   ng/mg creat   Noroxycodone                   576          EXPECTED   ng/mg creat    Sources of oxycodone include scheduled prescription medications.    Oxymorphone and noroxycodone are expected metabolites of    oxycodone. Oxymorphone is also available as a scheduled    prescription medication. Drug Present not Declared for Prescription Verification   Alcohol, Ethyl                 >0.400       UNEXPECTED g/dL    Sources of ethyl alcohol include alcoholic beverages or as a    fermentation product of glucose; glucose is present in this    specimen.  The high concentration of ethyl alcohol and the  presence of glucose supports fermentation as the source of ethyl    alcohol in this specimen. Drug Absent but Declared for Prescription Verification   Amphetamine                    Not Detected UNEXPECTED ng/mg creat ==================================================================== Test                      Result    Flag   Units      Ref Range   Creatinine              102              mg/dL      >=20 ==================================================================== Declared Medications:  The flagging and interpretation on this report are based on the  following declared medications.  Unexpected results may arise from  inaccuracies in the declared medications.  **Note: The testing scope of this panel includes these medications:  Alprazolam (Xanax)  Amphetamine (Adderall)  Oxycodone (Roxicodone)  **Note: The testing scope of this panel does not include following  reported medications:  Albuterol (ProAir)  Albuterol (Proventil)  Amlodipine (Norvasc)  Aspirin  Atorvastatin (Lipitor)  Benazepril (Lotensin)  Canagliflozin (Invokana)   Carvedilol (Coreg)  Citalopram (Celexa)  Clonidine (Catapres)  Duloxetine (Cymbalta)  Fluticasone (Advair)  Gabapentin  Insulin (Levemir)  Insulin (NovoLog)  Magnesium  Meloxicam (Mobic)  Metformin (Glucophage)  Mupirocin (Bactroban)  Naloxone  Nitroglycerin (Nitrostat)  Pantoprazole (Protonix)  Sacubitril (Entresto)  Salmeterol (Advair)  Tiotropium (Spiriva)  Valsartan (Entresto)  Vitamin D2 (Drisdol) ==================================================================== For clinical consultation, please call [phone]. ====================================================================    Summary  Date Value Ref Range Status  06/04/2016 FINAL  Final    Comment:    ==================================================================== TOXASSURE SELECT 13 (MW) ==================================================================== Test                             Result       Flag       Units Drug Present and Declared for Prescription Verification   Oxycodone                      601          EXPECTED   ng/mg creat   Oxymorphone                    147          EXPECTED   ng/mg creat   Noroxycodone                   300          EXPECTED   ng/mg creat    Sources of oxycodone include scheduled prescription medications.    Oxymorphone and noroxycodone are expected metabolites of    oxycodone. Oxymorphone is also available as a scheduled    prescription medication. Drug Present not Declared for Prescription Verification   Alcohol, Ethyl                 0.305        UNEXPECTED g/dL    Sources of ethyl alcohol include alcoholic beverages or as a    fermentation product of glucose; glucose is present in this    specimen.  The high concentration of ethyl alcohol and the    presence of glucose supports fermentation as the source of ethyl    alcohol in  this specimen. Drug Absent but Declared for Prescription Verification   Amphetamine                    Not Detected UNEXPECTED  ng/mg creat   Alprazolam                     Not Detected UNEXPECTED ng/mg creat ==================================================================== Test                      Result    Flag   Units      Ref Range   Creatinine              75               mg/dL      >=20 ==================================================================== Declared Medications:  The flagging and interpretation on this report are based on the  following declared medications.  Unexpected results may arise from  inaccuracies in the declared medications.  **Note: The testing scope of this panel includes these medications:  Alprazolam  Amphetamine  Amphetamine (Dextroamphetamine)  Oxycodone  **Note: The testing scope of this panel does not include following  reported medications:  Albuterol  Amlodipine  Aspirin (Aspirin 81)  Atorvastatin  Benazepril  Calcium  Canagliflozin  Carvedilol  Cholecalciferol  Citalopram  Clonidine  Duloxetine  Fluticasone  Gabapentin  Insulin (Levemir)  Insulin (NovoLog)  Magnesium Oxide  Meloxicam  Metformin  Naloxone  Nitroglycerin  Pantoprazole  Sacubitril (Entresto)  Salmeterol  Sildenafil  Tiotropium  Valsartan (Entresto) ==================================================================== For clinical consultation, please call [phone]. ====================================================================    UDS interpretation: Compliant          Medication Assessment Form: Reviewed. Patient indicates being compliant with therapy Treatment compliance: Compliant Risk Assessment Profile: Aberrant behavior: See prior evaluations. None observed or detected today Comorbid factors increasing risk of overdose: See prior notes. No additional risks detected today Risk of substance use disorder (SUD): Low     Opioid Risk Tool - 09/29/16 1338      Family History of Substance Abuse   Alcohol Negative   Illegal Drugs Negative   Rx Drugs Negative      Personal History of Substance Abuse   Alcohol Negative   Illegal Drugs Negative   Rx Drugs Negative     Age   Age between 21-45 years  No     History of Preadolescent Sexual Abuse   History of Preadolescent Sexual Abuse Negative or Male     Psychological Disease   Psychological Disease Negative   Depression Negative     Total Score   Opioid Risk Tool Scoring 0   Opioid Risk Interpretation Low Risk     ORT Scoring interpretation table:  Score <3 = Low Risk for SUD  Score between 4-7 = Moderate Risk for SUD  Score >8 = High Risk for Opioid Abuse   Risk Mitigation Strategies:  Patient Counseling: Covered Patient-Prescriber Agreement (PPA): Present and active  Notification to other healthcare providers: Done  Pharmacologic Plan: No change in therapy, at this time  Laboratory Chemistry  Inflammation Markers (CRP: Acute Phase) (ESR: Chronic Phase) Lab Results  Component Value Date   CRP 5.8 (H) 06/04/2016   ESRSEDRATE 2 06/04/2016                 Renal Function Markers Lab Results  Component Value Date   BUN 20 06/04/2016   CREATININE 1.08 06/04/2016   GFRAA 88 06/04/2016  GFRNONAA 76 06/04/2016                 Hepatic Function Markers Lab Results  Component Value Date   AST 16 06/04/2016   ALT 26 06/04/2016   ALBUMIN 4.3 06/04/2016   ALKPHOS 63 06/04/2016   HCVAB NEGATIVE 11/22/2008                 Electrolytes Lab Results  Component Value Date   NA 139 06/04/2016   K 5.4 (H) 06/04/2016   CL 97 06/04/2016   CALCIUM 9.5 06/04/2016   MG 1.7 06/04/2016                 Neuropathy Markers Lab Results  Component Value Date   VITAMINB12 473 06/04/2016                 Bone Pathology Markers Lab Results  Component Value Date   ALKPHOS 63 06/04/2016   VD25OH 31 02/03/2010   25OHVITD1 31 06/04/2016   25OHVITD2 1.9 06/04/2016   25OHVITD3 29 06/04/2016   CALCIUM 9.5 06/04/2016                 Coagulation Parameters Lab Results  Component Value  Date   INR 1.59 (H) 02/06/2013   LABPROT 18.5 (H) 02/06/2013   APTT 40 (H) 02/06/2013   PLT 307 03/15/2015                 Cardiovascular Markers Lab Results  Component Value Date   HGB 14.5 03/15/2015   HCT 45.5 03/15/2015                 Note: Lab results reviewed.  Recent Diagnostic Imaging Review  Dg C-arm 1-60 Min-no Report  Result Date: 06/15/2016 Fluoroscopy was utilized by the requesting physician.  No radiographic interpretation.   Note: Imaging results reviewed.          Meds   Current Outpatient Prescriptions:  .  albuterol (PROAIR HFA) 108 (90 Base) MCG/ACT inhaler, Inhale 1 puff into the lungs daily. , Disp: , Rfl:  .  albuterol (PROVENTIL) (2.5 MG/3ML) 0.083% nebulizer solution, Inhale 2.5 mg into the lungs 2 (two) times daily as needed. , Disp: , Rfl:  .  alprazolam (XANAX) 2 MG tablet, Take 1 tablet by mouth 3 (three) times daily. , Disp: , Rfl:  .  amLODipine (NORVASC) 10 MG tablet, Take 10 mg by mouth daily., Disp: , Rfl:  .  amphetamine-dextroamphetamine (ADDERALL) 30 MG tablet, Take 30 mg by mouth 3 (three) times daily. , Disp: , Rfl:  .  aspirin EC 81 MG tablet, Take 1 tablet (81 mg total) by mouth daily., Disp: , Rfl:  .  atorvastatin (LIPITOR) 80 MG tablet, Take 1 tablet by mouth daily., Disp: , Rfl:  .  benazepril (LOTENSIN) 20 MG tablet, Take 20 mg by mouth daily. , Disp: , Rfl:  .  Canagliflozin (INVOKANA) 100 MG TABS, Take 100 mg by mouth daily., Disp: , Rfl:  .  carvedilol (COREG) 25 MG tablet, Take 25 mg by mouth 2 (two) times daily with a meal. , Disp: , Rfl:  .  citalopram (CELEXA) 20 MG tablet, Take 1 tablet by mouth daily., Disp: , Rfl:  .  cloNIDine (CATAPRES) 0.3 MG tablet, Take 0.3 mg by mouth 2 (two) times daily., Disp: , Rfl: 1 .  D3 SUPER STRENGTH 2000 units CAPS, Take 1 capsule by mouth daily., Disp: , Rfl: 99 .  DULoxetine (CYMBALTA) 30 MG  capsule, 2 tabs daily, Disp: , Rfl:  .  fluticasone (FLONASE) 50 MCG/ACT nasal spray, Place 50  sprays into both nostrils 2 (two) times daily., Disp: , Rfl: 0 .  Fluticasone-Salmeterol (ADVAIR DISKUS) 500-50 MCG/DOSE AEPB, Inhale 1 puff into the lungs every 12 (twelve) hours.  , Disp: , Rfl:  .  gabapentin (NEURONTIN) 300 MG capsule, TAKE ONE CAPSULE BY MOUTH THREE TIMES DAILY. (TAKE WITH 600MG TABLET TO TOTAL 900MG), Disp: , Rfl: 1 .  gabapentin (NEURONTIN) 600 MG tablet, Take 600 mg by mouth 3 (three) times daily., Disp: , Rfl:  .  insulin aspart (NOVOLOG FLEXPEN) 100 UNIT/ML FlexPen, Inject 0-26 Units into the skin 3 (three) times daily with meals. 20 units three times a day with sliding scale max of 26 units, Disp: , Rfl:  .  insulin detemir (LEVEMIR) 100 UNIT/ML injection, Inject 50 Units into the skin at bedtime., Disp: , Rfl:  .  Insulin Pen Needle (LITETOUCH PEN NEEDLES) 29G X 12.7MM MISC, Use as directed., Disp: , Rfl:  .  Insulin Syringe-Needle U-100 (B-D INS SYR ULTRAFINE 1CC/31G) 31G X 5/16" 1 ML MISC, , Disp: , Rfl:  .  Insulin Syringe-Needle U-100 (B-D INS SYRINGE 0.5CC/31GX5/16) 31G X 5/16" 0.5 ML MISC, , Disp: , Rfl:  .  meloxicam (MOBIC) 15 MG tablet, Take 15 mg by mouth 2 (two) times daily., Disp: , Rfl:  .  metFORMIN (GLUCOPHAGE) 1000 MG tablet, Take 1,000 mg by mouth 2 (two) times daily. , Disp: , Rfl:  .  naloxone (NARCAN) 2 MG/2ML injection, Inject content of syringe into thigh muscle. Call 911., Disp: 2 Syringe, Rfl: 1 .  naloxone HCl 4 MG/0.1ML LIQD, One spray in either nostril once for known/suspected opioid overdose. May repeat every 2-3 minutes in alternating nostril til EMS arrives, Disp: , Rfl:  .  nitroGLYCERIN (NITROSTAT) 0.4 MG SL tablet, Place 1 tablet (0.4 mg total) under the tongue every 5 (five) minutes as needed for chest pain., Disp: 90 tablet, Rfl: 3 .  [START ON 11/05/2016] oxyCODONE (ROXICODONE) 15 MG immediate release tablet, Take 1 tablet (15 mg total) by mouth every 6 (six) hours as needed., Disp: 120 tablet, Rfl: 0 .  pantoprazole (PROTONIX) 40 MG  tablet, 1 tablet Two (2) times a day., Disp: , Rfl:  .  sacubitril-valsartan (ENTRESTO) 97-103 MG, Take 1 tablet by mouth 2 (two) times daily., Disp: 60 tablet, Rfl: 1 .  sildenafil (REVATIO) 20 MG tablet, Take 20 mg by mouth See admin instructions., Disp: , Rfl: 5 .  SURE COMFORT LANCETS 30G MISC, USE TO CHECK SUGAR THREE TIMES DAILY {AS DIRECTED], Disp: , Rfl: 3 .  tiotropium (SPIRIVA) 18 MCG inhalation capsule, Place 18 mcg into inhaler and inhale daily.  , Disp: , Rfl:  .  TRUETRACK TEST test strip, USE TO CHECK BLOOD SUGAR THREE TIMES DAILY, Disp: , Rfl: 3 .  HYDROmorphone (DILAUDID) 2 MG tablet, Take 1 tablet (2 mg total) by mouth every 6 (six) hours as needed for severe pain., Disp: 30 tablet, Rfl: 0 .  LEVEMIR FLEXTOUCH 100 UNIT/ML Pen, INJECT 20 UNITS EVERY MORNING., Disp: , Rfl: 6 .  Magnesium Oxide 500 MG CAPS, Take 1 capsule (500 mg total) by mouth 2 (two) times daily at 8 am and 10 pm., Disp: 100 capsule, Rfl: PRN .  [START ON 12/05/2016] oxyCODONE (ROXICODONE) 15 MG immediate release tablet, Take 1 tablet (15 mg total) by mouth every 6 (six) hours as needed for pain., Disp: 120 tablet, Rfl:  0 .  [START ON 10/06/2016] oxyCODONE (ROXICODONE) 15 MG immediate release tablet, Take 1 tablet (15 mg total) by mouth every 6 (six) hours as needed for pain., Disp: 120 tablet, Rfl: 0  ROS  Constitutional: Denies any fever or chills Gastrointestinal: No reported hemesis, hematochezia, vomiting, or acute GI distress Musculoskeletal: Denies any acute onset joint swelling, redness, loss of ROM, or weakness Neurological: No reported episodes of acute onset apraxia, aphasia, dysarthria, agnosia, amnesia, paralysis, loss of coordination, or loss of consciousness  Allergies  Mr. Peale is allergic to lodine [etodolac]; tylenol [acetaminophen]; iodine; and propoxyphene n-acetaminophen.  [place]  Drug: Mr. Asher  reports that he does not use drugs. Alcohol:  reports that he does not drink  alcohol. Tobacco:  reports that he quit smoking about 27 years ago. His smoking use included Cigarettes. He started smoking about 49 years ago. He has a 28.50 pack-year smoking history. He has never used smokeless tobacco. Medical:  has a past medical history of Anxiety; Cellulitis (2011); Chronic back pain; Claustrophobia; COPD (chronic obstructive pulmonary disease) ([place]); Diabetes mellitus, type 2 ([place]); Diverticulitis of colon (2008); GERD (gastroesophageal reflux disease); Heart attack ([place]); Hyperlipidemia; Hypertension; Obesity; and Pulmonary nodule. Surgical: Mr. Spirito  has a past surgical history that includes Appendectomy (2007); Tonsillectomy; Coronary artery bypass graft (N/A, 02/06/2013); and left heart catheterization with coronary angiogram (N/A, 02/03/2013). Family: family history includes Cancer in his mother; Coronary artery disease in his mother and sister; Diabetes in his sister; Heart attack in his brother and father.  Constitutional Exam  General appearance: Well nourished, well developed, and well hydrated. In no apparent acute distress Vitals:   09/29/16 1330  BP: (!) 157/71  Pulse: 73  Resp: 18  Temp: 98.3 F (36.8 C)  TempSrc: Oral  SpO2: 99%  Weight: 238 lb (108 kg)  Height: _0  (1.753 m)  Psych/Mental status: Alert, oriented x 3 (person, place, & time)       Eyes: PERLA Respiratory: No evidence of acute respiratory distress  Lumbar Spine Area Exam  Skin & Axial Inspection: No masses, redness, or swelling Alignment: Symmetrical Functional ROM: Unrestricted ROM      Stability: No instability detected Muscle Tone/Strength: Functionally intact. No obvious neuro-muscular anomalies detected. Sensory (Neurological): Unimpaired Palpation: Complains of area being tender to palpation       Provocative Tests: Lumbar Hyperextension and rotation test: Positive bilaterally for facet joint pain. Lumbar Lateral bending test: evaluation deferred today       Patrick's  Maneuver: evaluation deferred today                    Gait & Posture Assessment  Ambulation: Unassisted Gait: Relatively normal for age and body habitus Posture: WNL   Lower Extremity Exam    Side: Right lower extremity  Side: Left lower extremity  Skin & Extremity Inspection: Skin color, temperature, and hair growth are WNL. No peripheral edema or cyanosis. No masses, redness, swelling, asymmetry, or associated skin lesions. No contractures.  Skin & Extremity Inspection: Skin color, temperature, and hair growth are WNL. No peripheral edema or cyanosis. No masses, redness, swelling, asymmetry, or associated skin lesions. No contractures.  Functional ROM: Unrestricted ROM          Functional ROM: Unrestricted ROM          Muscle Tone/Strength: Functionally intact. No obvious neuro-muscular anomalies detected.  Muscle Tone/Strength: Functionally intact. No obvious neuro-muscular anomalies detected.  Sensory (Neurological): Unimpaired  Sensory (Neurological): Unimpaired  Palpation: No  palpable anomalies  Palpation: No palpable anomalies   Assessment  Primary Diagnosis & Pertinent Problem List: The primary encounter diagnosis was Chronic low back pain (Location of Secondary source of pain) (Bilateral) (L>R). Diagnoses of Chronic lumbar radicular pain (L5 dermatome) (Right), Chronic lower extremity pain (Location of Tertiary source of pain) (Bilateral) (R>L), and Chronic pain syndrome were also pertinent to this visit.  Status Diagnosis  Controlled Controlled Controlled 1. Chronic low back pain (Location of Secondary source of pain) (Bilateral) (L>R)   2. Chronic lumbar radicular pain (L5 dermatome) (Right)   3. Chronic lower extremity pain (Location of Tertiary source of pain) (Bilateral) (R>L)   4. Chronic pain syndrome     Problems updated and reviewed during this visit: No problems updated. Plan of Care  Pharmacotherapy (Medications Ordered): Meds ordered this encounter  Medications   . oxyCODONE (ROXICODONE) 15 MG immediate release tablet    Sig: Take 1 tablet (15 mg total) by mouth every 6 (six) hours as needed.    Dispense:  120 tablet    Refill:  0    Do not add this medication to the electronic "Automatic Refill" notification system. Patient may have prescription filled one day early if pharmacy is closed on scheduled refill date. Do not fill until: 11/05/2016 To last until: 12/05/2016    Order Specific Question:   Supervising Provider    Answer:   Milinda Pointer [phone]  . oxyCODONE (ROXICODONE) 15 MG immediate release tablet    Sig: Take 1 tablet (15 mg total) by mouth every 6 (six) hours as needed for pain.    Dispense:  120 tablet    Refill:  0    Do not add this medication to the electronic "Automatic Refill" notification system. Patient may have prescription filled one day early if pharmacy is closed on scheduled refill date. Do not fill until:12/05/2016 To last until: 01/04/2017    Order Specific Question:   Supervising Provider    Answer:   Milinda Pointer [phone]  . oxyCODONE (ROXICODONE) 15 MG immediate release tablet    Sig: Take 1 tablet (15 mg total) by mouth every 6 (six) hours as needed for pain.    Dispense:  120 tablet    Refill:  0    Do not add this medication to the electronic "Automatic Refill" notification system. Patient may have prescription filled one day early if pharmacy is closed on scheduled refill date. Do not fill until: 10/06/2016 To last until: 11/05/2016    Order Specific Question:   Supervising Provider    Answer:   Milinda Pointer [phone]   New Prescriptions   No medications on file   Medications administered today: Mr. Cheever had no medications administered during this visit. Lab-work, procedure(s), and/or referral(s): No orders of the defined types were placed in this encounter.  Imaging and/or referral(s): None  Interventional therapies: Planned, scheduled, and/or pending:   Therapeutic Right-sided  lumbar facet + sacroiliac joint RFA    Considering:   Diagnostic bilateralintra-articular knee injection Diagnostic bilateral genicular nerve block Possible bilateral genicular nerve radiofrequency ablation Diagnostic bilateral lumbar facet block Possible bilaterallumbar facet radiofrequency ablation(Left side done on 03/30/2016) Diagnostic left-sided L4-5 lumbar epidural steroid injection Diagnostic bilateral sacroiliac joint block Possible bilateral sacroiliac joint radiofrequency ablation   Palliative PRN treatment(s):   Palliativebilateral intra-articular knee injection Palliativebilateral genicular nerveblock Palliativebilateral lumbar facetblock Palliativeleft-sided L4-5 lumbar epidural steroid injection Palliativebilateral sacroiliac jointblock   Provider-requested follow-up: Return in about 3 months (around 12/29/2016) for MedMgmt.  Future  Appointments Date Time Provider [place]  12/29/2016 1:30 PM Vevelyn Francois, NP [facility] None   Primary Care Physician: [facility] [place] Location: [facility] Note by: Vevelyn Francois NP Date: 09/29/2016; Time: 4:01 PM  Pain Score Disclaimer: We use the NRS-11 scale. This is a self-reported, subjective measurement of pain severity with only modest accuracy. It is used primarily to identify changes within a particular patient. It must be understood that outpatient pain scales are significantly less accurate that those used for research, where they can be applied under ideal controlled circumstances with minimal exposure to variables. In reality, the score is likely to be a combination of pain intensity and pain affect, where pain affect describes the degree of emotional arousal or changes in action readiness caused by the sensory experience of pain. Factors such as social and work situation, setting, emotional state, anxiety levels, expectation, and prior pain  experience may influence pain perception and show large inter-individual differences that may also be affected by time variables.  Patient instructions provided during this appointment: Patient Instructions    ____________________________________________________________________________________________  Medication Rules  Applies to: All patients receiving prescriptions (written or electronic).  Pharmacy of record: Pharmacy where electronic prescriptions will be sent. If written prescriptions are taken to a different pharmacy, please inform the nursing staff. The pharmacy listed in the electronic medical record should be the one where you would like electronic prescriptions to be sent.  Prescription refills: Only during scheduled appointments. Applies to both, written and electronic prescriptions.  NOTE: The following applies primarily to controlled substances (Opioid* Pain Medications).   Patient's responsibilities: 1. Pain Pills: Bring all pain pills to every appointment (except for procedure appointments). 2. Pill Bottles: Bring pills in original pharmacy bottle. Always bring newest bottle. Bring bottle, even if empty. 3. Medication refills: You are responsible for knowing and keeping track of what medications you need refilled. The day before your appointment, write a list of all prescriptions that need to be refilled. Bring that list to your appointment and give it to the admitting nurse. Prescriptions will be written only during appointments. If you forget a medication, it will not be "Called in", "Faxed", or "electronically sent". You will need to get another appointment to get these prescribed. 4. Prescription Accuracy: You are responsible for carefully inspecting your prescriptions before leaving our office. Have the discharge nurse carefully go over each prescription with you, before taking them home. Make sure that your name is accurately spelled, that your address is correct. Check  the name and dose of your medication to make sure it is accurate. Check the number of pills, and the written instructions to make sure they are clear and accurate. Make sure that you are given enough medication to last until your next medication refill appointment. 5. Taking Medication: Take medication as prescribed. Never take more pills than instructed. Never take medication more frequently than prescribed. Taking less pills or less frequently is permitted and encouraged, when it comes to controlled substances (written prescriptions).  6. Inform other Doctors: Always inform, all of your healthcare providers, of all the medications you take. 7. Pain Medication from other Providers: You are not allowed to accept any additional pain medication from any other Doctor or Healthcare provider. There are two exceptions to this rule. (see below) In the event that you require additional pain medication, you are responsible for notifying us, as stated below. 8. Medication Agreement: You are responsible for carefully reading and  following our Medication Agreement. This must be signed before receiving any prescriptions from our practice. Safely store a copy of your signed Agreement. Violations to the Agreement will result in no further prescriptions. (Additional copies of our Medication Agreement are available upon request.) 9. Laws, Rules, & Regulations: All patients are expected to follow all Federal and [employer], Rules, [employer]. Ignorance of the Laws does not constitute a valid excuse. The use of any illegal substances is prohibited. 10. Adopted CDC guidelines & recommendations: Target dosing levels will be at or below 60 MME/day. Use of benzodiazepines** is not recommended.  Exceptions: There are only two exceptions to the rule of not receiving pain medications from other Healthcare Providers. 1. Exception #1 (Emergencies): In the event of an emergency (i.e.: accident requiring emergency care),  you are allowed to receive additional pain medication. However, you are responsible for: As soon as you are able, call our office [phone], at any time of the day or night, and leave a message stating your name, the date and nature of the emergency, and the name and dose of the medication prescribed. In the event that your call is answered by a member of our staff, make sure to document and save the date, time, and the name of the person that took your information.  2. Exception #2 (Planned Surgery): In the event that you are scheduled by another doctor or dentist to have any type of surgery or procedure, you are allowed (for a period no longer than 30 days), to receive additional pain medication, for the acute post-op pain. However, in this case, you are responsible for picking up a copy of our "Post-op Pain Management for Surgeons" handout, and giving it to your surgeon or dentist. This document is available at our office, and does not require an appointment to obtain it. Simply go to our office during business hours (Monday-Thursday from 8:00 AM to 4:00 PM) (Friday 8:00 AM to 12:00 Noon) or if you have a scheduled appointment with [place], prior to your surgery, and ask for it by name. In addition, you will need to provide [place] with your name, name of your surgeon, type of surgery, and date of procedure or surgery.  *Opioid medications include: morphine, codeine, oxycodone, oxymorphone, hydrocodone, hydromorphone, meperidine, tramadol, tapentadol, buprenorphine, fentanyl, methadone. **Benzodiazepine medications include: diazepam (Valium), alprazolam (Xanax), clonazepam (Klonopine), lorazepam (Ativan), clorazepate (Tranxene), chlordiazepoxide (Librium), estazolam (Prosom), oxazepam (Serax), temazepam (Restoril), triazolam (Halcion)  ____________________________________________________________________________________________  BMI Assessment: Estimated body mass index is 35.15 kg/m as calculated from the  following:   Height as of this encounter: _0  (1.753 m).   Weight as of this encounter: 238 lb (108 kg).  BMI interpretation table: BMI level Category Range association with higher incidence of chronic pain  <18 kg/m2 Underweight   18.5-24.9 kg/m2 Ideal body weight   25-29.9 kg/m2 Overweight Increased incidence by 20%  30-34.9 kg/m2 Obese (Class I) Increased incidence by 68%  35-39.9 kg/m2 Severe obesity (Class II) Increased incidence by 136%  >40 kg/m2 Extreme obesity (Class III) Increased incidence by 254%   BMI Readings from Last 4 Encounters:  09/29/16 35.15 kg/m  07/28/16 34.79 kg/m  06/15/16 35.29 kg/m  06/04/16 35.88 kg/m   Wt Readings from Last 4 Encounters:  09/29/16 238 lb (108 kg)  07/28/16 239 lb (108.4 kg)  06/15/16 239 lb (108.4 kg)  06/04/16 243 lb (110.2 kg)  .dwpre You were given 3 prescriptions Oxycodone today.

## 2016-09-29 NOTE — Progress Notes (Signed)
Nursing Pain Medication Assessment:  Safety precautions to be maintained throughout the outpatient stay will include: orient to surroundings, keep bed in low position, maintain call bell within reach at all times, provide assistance with transfer out of bed and ambulation.  Medication Inspection Compliance: Pill count conducted under aseptic conditions, in front of the patient. Neither the pills nor the bottle was removed from the patient's sight at any time. Once count was completed pills were immediately returned to the patient in their original bottle.  Medication: Oxycodone IR Pill/Patch Count: 34. of 120 pills remain Pill/Patch Appearance: Markings consistent with prescribed medication Bottle Appearance: Standard pharmacy container. Clearly labeled. Filled Date: 08/25 / 2018 Last Medication intake:  Today

## 2016-11-17 ENCOUNTER — Encounter: Payer: Self-pay | Admitting: Gastroenterology

## 2016-12-29 ENCOUNTER — Other Ambulatory Visit: Payer: Self-pay

## 2016-12-29 ENCOUNTER — Encounter: Payer: Self-pay | Admitting: Nurse Practitioner

## 2016-12-29 ENCOUNTER — Ambulatory Visit: Payer: Medicare Other | Attending: Nurse Practitioner | Admitting: Nurse Practitioner

## 2016-12-29 VITALS — BP 140/79 | HR 95 | Temp 97.7°F | Resp 16 | Ht 69.0 in | Wt 240.0 lb

## 2016-12-29 DIAGNOSIS — M5416 Radiculopathy, lumbar region: Secondary | ICD-10-CM | POA: Diagnosis not present

## 2016-12-29 DIAGNOSIS — J449 Chronic obstructive pulmonary disease, unspecified: Secondary | ICD-10-CM | POA: Insufficient documentation

## 2016-12-29 DIAGNOSIS — Z6835 Body mass index (BMI) 35.0-35.9, adult: Secondary | ICD-10-CM | POA: Diagnosis not present

## 2016-12-29 DIAGNOSIS — I252 Old myocardial infarction: Secondary | ICD-10-CM | POA: Insufficient documentation

## 2016-12-29 DIAGNOSIS — M25562 Pain in left knee: Secondary | ICD-10-CM | POA: Diagnosis present

## 2016-12-29 DIAGNOSIS — M533 Sacrococcygeal disorders, not elsewhere classified: Secondary | ICD-10-CM | POA: Insufficient documentation

## 2016-12-29 DIAGNOSIS — M170 Bilateral primary osteoarthritis of knee: Secondary | ICD-10-CM | POA: Diagnosis not present

## 2016-12-29 DIAGNOSIS — G894 Chronic pain syndrome: Secondary | ICD-10-CM

## 2016-12-29 DIAGNOSIS — I10 Essential (primary) hypertension: Secondary | ICD-10-CM | POA: Diagnosis not present

## 2016-12-29 DIAGNOSIS — M4726 Other spondylosis with radiculopathy, lumbar region: Secondary | ICD-10-CM | POA: Insufficient documentation

## 2016-12-29 DIAGNOSIS — R6510 Systemic inflammatory response syndrome (SIRS) of non-infectious origin without acute organ dysfunction: Secondary | ICD-10-CM | POA: Insufficient documentation

## 2016-12-29 DIAGNOSIS — E669 Obesity, unspecified: Secondary | ICD-10-CM | POA: Diagnosis not present

## 2016-12-29 DIAGNOSIS — Z87891 Personal history of nicotine dependence: Secondary | ICD-10-CM | POA: Diagnosis present

## 2016-12-29 DIAGNOSIS — M25561 Pain in right knee: Secondary | ICD-10-CM | POA: Diagnosis not present

## 2016-12-29 DIAGNOSIS — E1142 Type 2 diabetes mellitus with diabetic polyneuropathy: Secondary | ICD-10-CM | POA: Insufficient documentation

## 2016-12-29 DIAGNOSIS — E559 Vitamin D deficiency, unspecified: Secondary | ICD-10-CM | POA: Insufficient documentation

## 2016-12-29 DIAGNOSIS — Z951 Presence of aortocoronary bypass graft: Secondary | ICD-10-CM | POA: Diagnosis not present

## 2016-12-29 DIAGNOSIS — M545 Low back pain, unspecified: Secondary | ICD-10-CM

## 2016-12-29 DIAGNOSIS — E785 Hyperlipidemia, unspecified: Secondary | ICD-10-CM | POA: Diagnosis not present

## 2016-12-29 DIAGNOSIS — G8929 Other chronic pain: Secondary | ICD-10-CM

## 2016-12-29 DIAGNOSIS — Z794 Long term (current) use of insulin: Secondary | ICD-10-CM | POA: Diagnosis not present

## 2016-12-29 DIAGNOSIS — Z79891 Long term (current) use of opiate analgesic: Secondary | ICD-10-CM | POA: Insufficient documentation

## 2016-12-29 DIAGNOSIS — I2510 Atherosclerotic heart disease of native coronary artery without angina pectoris: Secondary | ICD-10-CM | POA: Diagnosis not present

## 2016-12-29 DIAGNOSIS — K219 Gastro-esophageal reflux disease without esophagitis: Secondary | ICD-10-CM | POA: Insufficient documentation

## 2016-12-29 DIAGNOSIS — F419 Anxiety disorder, unspecified: Secondary | ICD-10-CM | POA: Insufficient documentation

## 2016-12-29 MED ORDER — OXYCODONE HCL 15 MG PO TABS: 15.0000 mg | ORAL_TABLET | Freq: Four times a day (QID) | ORAL | 0 refills | Status: DC | PRN

## 2016-12-29 NOTE — Progress Notes (Signed)
Patient's Name: Manuel Knight  MRN: [id number]  Referring Provider: [facility]  DOB: 1960-08-14  PCP: [facility]  DOS: 12/29/2016  Note by: Vevelyn Francois NP  Service setting: Ambulatory outpatient  Specialty: Interventional Pain Management  Location: [facility] (AMB) Pain Management Facility    Patient type: Established    Primary Reason(s) for Visit: Encounter for prescription drug management. (Level of risk: moderate)  CC: Back Pain (lower) and Knee Pain (left)  HPI  Manuel Knight is a 56 y.o. year old, male patient, who comes today for a medication management evaluation. He has Type 2 diabetes mellitus with hyperglycemia, with long-term current use of insulin ([place]); HLD (hyperlipidemia); Obesity, unspecified; Anxiety state; Essential hypertension; COPD with emphysema ([place]); PULMONARY NODULE; HERNIA; GERD (gastroesophageal reflux disease); Esophageal dysphagia; NSTEMI (non-ST elevated myocardial infarction) ([place]); CAD (coronary artery disease), native coronary artery; S/P CABG x 3; Chronic systolic heart failure ([place]); Emphysema of lung ([place]); Lumbar spondylosis with lumbar radiculitis (Left); H/O coronary artery bypass surgery; Encounter for pain management planning; Encounter for therapeutic drug level monitoring; Opiate use (90 MME/Day); Chronic low back pain (Location of Secondary source of pain) (Bilateral) (L>R); Chronic knee pain (Location of Primary Source of Pain) (Bilateral) (L>R); Chronic foot pain (Bilateral) (L>R); Disturbance of skin sensation; Diabetic peripheral neuropathy (HCC) (lower extremity); Chronic lumbar radicular pain (L5 dermatome) (Right); Chronic sacroiliac joint pain (Bilateral) (L>R); Osteoarthritis of knee (Bilateral) (L>R); Lumbar facet syndrome (Bilateral) (L>R); Lumbar facet arthropathy; Chronic lower extremity pain (Location of Tertiary source of pain) (Bilateral) (R>L); History of marijuana use; Hypomagnesemia; Vitamin D  insufficiency; Long term current use of opiate analgesic; Long term prescription opiate use; Chronic pain syndrome; SIRS (systemic inflammatory response syndrome) (HCC); and Spondylosis of lumbar spine on their problem list. His primarily concern today is the Back Pain (lower) and Knee Pain (left)  Pain Assessment: Location: Lower Back Radiating: left knee Onset: More than a month ago Duration: Chronic pain Quality: Constant, Stabbing Severity: 2 /10 (self-reported pain score)  Note: Reported level is compatible with observation.                          Effect on ADL: go but not as good Timing: Constant Modifying factors: medications  Manuel Knight was last scheduled for an appointment on 09/29/2016 for medication management. During today's appointment we reviewed Manuel Knight's chronic pain status, as well as his outpatient medication regimen. He states that he has increased aching with the weather.   The patient  reports that he does not use drugs. His body mass index is 35.44 kg/m.  Further details on both, my assessment(s), as well as the proposed treatment plan, please see below.  Controlled Substance Pharmacotherapy Assessment REMS (Risk Evaluation and Mitigation Strategy)  Analgesic:Oxycodone IR 15 mg every 6 hours (60 mg/day) MME/day:90 mg/day.    Morley Kos, RN  12/29/2016  1:36 PM  Sign at close encounter Nursing Pain Medication Assessment:  Safety precautions to be maintained throughout the outpatient stay will include: orient to surroundings, keep bed in low position, maintain call bell within reach at all times, provide assistance with transfer out of bed and ambulation.  Medication Inspection Compliance: Pill count conducted under aseptic conditions, in front of the patient. Neither the pills nor the bottle was removed from the patient's sight at any time. Once count was completed pills were immediately returned to the patient in their original bottle.  Medication:  See  above Pill/Patch Count: 20 of 120 pills remain Pill/Patch Appearance: Markings consistent with prescribed medication Bottle Appearance: Standard pharmacy container. Clearly labeled. Filled Date: 9 / 24 / 2018 Last Medication intake:  Today   Pharmacokinetics: Liberation and absorption (onset of action): WNL Distribution (time to peak effect): WNL Metabolism and excretion (duration of action): WNL         Pharmacodynamics: Desired effects: Analgesia: Manuel Knight reports >50% benefit. Functional ability: Patient reports that medication allows him to accomplish basic ADLs Clinically meaningful improvement in function (CMIF): Sustained CMIF goals met Perceived effectiveness: Described as relatively effective, allowing for increase in activities of daily living (ADL) Undesirable effects: Side-effects or Adverse reactions: None reported Monitoring: [place] PMP: Online review of the past 31-monthperiod conducted. Compliant with practice rules and regulations Last UDS on record: Summary  Date Value Ref Range Status  06/04/2016 FINAL  Final    Comment:    ==================================================================== TOXASSURE SELECT 13 (MW) ==================================================================== Test                             Result       Flag       Units Drug Present and Declared for Prescription Verification   Oxycodone                      601          EXPECTED   ng/mg creat   Oxymorphone                    147          EXPECTED   ng/mg creat   Noroxycodone                   300          EXPECTED   ng/mg creat    Sources of oxycodone include scheduled prescription medications.    Oxymorphone and noroxycodone are expected metabolites of    oxycodone. Oxymorphone is also available as a scheduled    prescription medication. Drug Present not Declared for Prescription Verification   Alcohol, Ethyl                 0.305        UNEXPECTED g/dL    Sources of ethyl  alcohol include alcoholic beverages or as a    fermentation product of glucose; glucose is present in this    specimen.  The high concentration of ethyl alcohol and the    presence of glucose supports fermentation as the source of ethyl    alcohol in this specimen. Drug Absent but Declared for Prescription Verification   Amphetamine                    Not Detected UNEXPECTED ng/mg creat   Alprazolam                     Not Detected UNEXPECTED ng/mg creat ==================================================================== Test                      Result    Flag   Units      Ref Range   Creatinine              75               mg/dL      >=20 ==================================================================== Declared  Medications:  The flagging and interpretation on this report are based on the  following declared medications.  Unexpected results may arise from  inaccuracies in the declared medications.  **Note: The testing scope of this panel includes these medications:  Alprazolam  Amphetamine  Amphetamine (Dextroamphetamine)  Oxycodone  **Note: The testing scope of this panel does not include following  reported medications:  Albuterol  Amlodipine  Aspirin (Aspirin 81)  Atorvastatin  Benazepril  Calcium  Canagliflozin  Carvedilol  Cholecalciferol  Citalopram  Clonidine  Duloxetine  Fluticasone  Gabapentin  Insulin (Levemir)  Insulin (NovoLog)  Magnesium Oxide  Meloxicam  Metformin  Naloxone  Nitroglycerin  Pantoprazole  Sacubitril (Entresto)  Salmeterol  Sildenafil  Tiotropium  Valsartan (Entresto) ==================================================================== For clinical consultation, please call [phone]. ====================================================================    UDS interpretation: Compliant          Medication Assessment Form: Reviewed. Patient indicates being compliant with therapy Treatment compliance: Compliant Risk  Assessment Profile: Aberrant behavior: See prior evaluations. None observed or detected today Comorbid factors increasing risk of overdose: See prior notes. No additional risks detected today Risk of substance use disorder (SUD): Low Opioid Risk Tool - 12/29/16 1326      Family History of Substance Abuse   Alcohol  Positive Male    Illegal Drugs  Negative    Rx Drugs  Negative      Personal History of Substance Abuse   Alcohol  Negative    Illegal Drugs  Negative    Rx Drugs  Negative      Age   Age between 34-45 years   No      History of Preadolescent Sexual Abuse   History of Preadolescent Sexual Abuse  Negative or Male      Psychological Disease   Psychological Disease  Negative    Depression  Negative      Total Score   Opioid Risk Tool Scoring  3    Opioid Risk Interpretation  Low Risk      ORT Scoring interpretation table:  Score <3 = Low Risk for SUD  Score between 4-7 = Moderate Risk for SUD  Score >8 = High Risk for Opioid Abuse   Risk Mitigation Strategies:  Patient Counseling: Covered Patient-Prescriber Agreement (PPA): Present and active  Notification to other healthcare providers: Done  Pharmacologic Plan: No change in therapy, at this time  Laboratory Chemistry  Inflammation Markers (CRP: Acute Phase) (ESR: Chronic Phase) Lab Results  Component Value Date   CRP 5.8 (H) 06/04/2016   ESRSEDRATE 2 06/04/2016   LATICACIDVEN 1.3 03/12/2015                 Rheumatology Markers Lab Results  Component Value Date   LABURIC 5.9 02/04/2013                Renal Function Markers Lab Results  Component Value Date   BUN 20 06/04/2016   CREATININE 1.08 06/04/2016   GFRAA 88 06/04/2016   GFRNONAA 76 06/04/2016                 Hepatic Function Markers Lab Results  Component Value Date   AST 16 06/04/2016   ALT 26 06/04/2016   ALBUMIN 4.3 06/04/2016   ALKPHOS 63 06/04/2016   HCVAB NEGATIVE 11/22/2008   LIPASE 35 11/21/2008                  Electrolytes Lab Results  Component Value Date   NA 139 06/04/2016  K 5.4 (H) 06/04/2016   CL 97 06/04/2016   CALCIUM 9.5 06/04/2016   MG 1.7 06/04/2016   PHOS 3.2 03/12/2015                 Neuropathy Markers Lab Results  Component Value Date   VITAMINB12 473 06/04/2016   HGBA1C 13.8 (H) 03/12/2015   HIV Non Reactive 03/15/2015                 Bone Pathology Markers Lab Results  Component Value Date   VD25OH 31 02/03/2010   25OHVITD1 31 06/04/2016   25OHVITD2 1.9 06/04/2016   25OHVITD3 29 06/04/2016                 Coagulation Parameters Lab Results  Component Value Date   INR 1.59 (H) 02/06/2013   LABPROT 18.5 (H) 02/06/2013   APTT 40 (H) 02/06/2013   PLT 307 03/15/2015   DDIMER 0.32 05/21/2011                 Cardiovascular Markers Lab Results  Component Value Date   CKTOTAL 63 05/22/2011   CKMB 2.1 05/22/2011   TROPONINI 3.91 (HH) 02/04/2013   HGB 14.5 03/15/2015   HCT 45.5 03/15/2015                 CA Markers No results found for: CEA, CA125, LABCA2               Note: Lab results reviewed.  Recent Diagnostic Imaging Results  DG C-Arm 1-60 Min-No Report Fluoroscopy was utilized by the requesting physician.  No radiographic  interpretation.   Complexity Note: Imaging results reviewed. Results shared with Mr. Carriker, using Layman's terms.                         Meds   Current Outpatient Medications:  .  albuterol (PROAIR HFA) 108 (90 Base) MCG/ACT inhaler, Inhale 1 puff into the lungs daily. , Disp: , Rfl:  .  albuterol (PROVENTIL) (2.5 MG/3ML) 0.083% nebulizer solution, Inhale 2.5 mg into the lungs 2 (two) times daily as needed. , Disp: , Rfl:  .  alprazolam (XANAX) 2 MG tablet, Take 1 tablet by mouth 3 (three) times daily. , Disp: , Rfl:  .  amLODipine (NORVASC) 10 MG tablet, Take 10 mg by mouth daily., Disp: , Rfl:  .  amphetamine-dextroamphetamine (ADDERALL) 30 MG tablet, Take 30 mg by mouth 3 (three) times daily. , Disp: , Rfl:  .   aspirin EC 81 MG tablet, Take 1 tablet (81 mg total) by mouth daily. (Patient taking differently: Take 81 mg by mouth 2 (two) times daily. ), Disp: , Rfl:  .  atorvastatin (LIPITOR) 80 MG tablet, Take 1 tablet by mouth daily., Disp: , Rfl:  .  benazepril (LOTENSIN) 20 MG tablet, Take 20 mg by mouth daily. , Disp: , Rfl:  .  Canagliflozin (INVOKANA) 100 MG TABS, Take 100 mg by mouth daily., Disp: , Rfl:  .  carvedilol (COREG) 25 MG tablet, Take 25 mg by mouth 2 (two) times daily with a meal. , Disp: , Rfl:  .  citalopram (CELEXA) 20 MG tablet, Take 1 tablet by mouth daily., Disp: , Rfl:  .  cloNIDine (CATAPRES) 0.3 MG tablet, Take 0.3 mg by mouth 2 (two) times daily., Disp: , Rfl: 1 .  D3 SUPER STRENGTH 2000 units CAPS, Take 1 capsule by mouth daily., Disp: , Rfl: 99 .  DULoxetine (CYMBALTA)  30 MG capsule, 2 tabs daily, Disp: , Rfl:  .  fluticasone (FLONASE) 50 MCG/ACT nasal spray, Place 50 sprays into both nostrils 2 (two) times daily., Disp: , Rfl: 0 .  Fluticasone-Salmeterol (ADVAIR DISKUS) 500-50 MCG/DOSE AEPB, Inhale 1 puff into the lungs every 12 (twelve) hours.  , Disp: , Rfl:  .  gabapentin (NEURONTIN) 300 MG capsule, TAKE ONE CAPSULE BY MOUTH THREE TIMES DAILY. (TAKE WITH 600MG TABLET TO TOTAL 900MG), Disp: , Rfl: 1 .  gabapentin (NEURONTIN) 600 MG tablet, Take 600 mg by mouth 3 (three) times daily., Disp: , Rfl:  .  insulin aspart (NOVOLOG FLEXPEN) 100 UNIT/ML FlexPen, Inject 0-26 Units into the skin 3 (three) times daily with meals. 20 units three times a day with sliding scale max of 26 units, Disp: , Rfl:  .  insulin detemir (LEVEMIR) 100 UNIT/ML injection, Inject 50 Units into the skin at bedtime., Disp: , Rfl:  .  Insulin Pen Needle (LITETOUCH PEN NEEDLES) 29G X 12.7MM MISC, Use as directed., Disp: , Rfl:  .  Insulin Syringe-Needle U-100 (B-D INS SYR ULTRAFINE 1CC/31G) 31G X 5/16" 1 ML MISC, , Disp: , Rfl:  .  Insulin Syringe-Needle U-100 (B-D INS SYRINGE 0.5CC/31GX5/16) 31G X 5/16"  0.5 ML MISC, , Disp: , Rfl:  .  LEVEMIR FLEXTOUCH 100 UNIT/ML Pen, INJECT 20 UNITS EVERY MORNING., Disp: , Rfl: 6 .  meloxicam (MOBIC) 15 MG tablet, Take 15 mg by mouth 2 (two) times daily., Disp: , Rfl:  .  metFORMIN (GLUCOPHAGE) 1000 MG tablet, Take 1,000 mg by mouth 2 (two) times daily. , Disp: , Rfl:  .  naloxone (NARCAN) 2 MG/2ML injection, Inject content of syringe into thigh muscle. Call 911., Disp: 2 Syringe, Rfl: 1 .  naloxone HCl 4 MG/0.1ML LIQD, One spray in either nostril once for known/suspected opioid overdose. May repeat every 2-3 minutes in alternating nostril til EMS arrives, Disp: , Rfl:  .  nitroGLYCERIN (NITROSTAT) 0.4 MG SL tablet, Place 1 tablet (0.4 mg total) under the tongue every 5 (five) minutes as needed for chest pain., Disp: 90 tablet, Rfl: 3 .  [START ON 03/05/2017] oxyCODONE (ROXICODONE) 15 MG immediate release tablet, Take 1 tablet (15 mg total) by mouth every 6 (six) hours as needed for pain., Disp: 120 tablet, Rfl: 0 .  pantoprazole (PROTONIX) 40 MG tablet, 1 tablet Two (2) times a day., Disp: , Rfl:  .  sacubitril-valsartan (ENTRESTO) 97-103 MG, Take 1 tablet by mouth 2 (two) times daily., Disp: 60 tablet, Rfl: 1 .  sildenafil (REVATIO) 20 MG tablet, Take 20 mg by mouth See admin instructions., Disp: , Rfl: 5 .  SURE COMFORT LANCETS 30G MISC, USE TO CHECK SUGAR THREE TIMES DAILY {AS DIRECTED], Disp: , Rfl: 3 .  tiotropium (SPIRIVA) 18 MCG inhalation capsule, Place 18 mcg into inhaler and inhale daily.  , Disp: , Rfl:  .  TRUETRACK TEST test strip, USE TO CHECK BLOOD SUGAR THREE TIMES DAILY, Disp: , Rfl: 3 .  HYDROmorphone (DILAUDID) 2 MG tablet, Take 1 tablet (2 mg total) by mouth every 6 (six) hours as needed for severe pain., Disp: 30 tablet, Rfl: 0 .  Magnesium Oxide 500 MG CAPS, Take 1 capsule (500 mg total) by mouth 2 (two) times daily at 8 am and 10 pm., Disp: 100 capsule, Rfl: PRN .  [START ON 02/03/2017] oxyCODONE (ROXICODONE) 15 MG immediate release tablet,  Take 1 tablet (15 mg total) by mouth every 6 (six) hours as needed., Disp: 120  tablet, Rfl: 0 .  [START ON 01/04/2017] oxyCODONE (ROXICODONE) 15 MG immediate release tablet, Take 1 tablet (15 mg total) by mouth every 6 (six) hours as needed for pain., Disp: 120 tablet, Rfl: 0  ROS  Constitutional: Denies any fever or chills Gastrointestinal: No reported hemesis, hematochezia, vomiting, or acute GI distress Musculoskeletal: Denies any acute onset joint swelling, redness, loss of ROM, or weakness Neurological: No reported episodes of acute onset apraxia, aphasia, dysarthria, agnosia, amnesia, paralysis, loss of coordination, or loss of consciousness  Allergies  Mr. Kelnhofer is allergic to lodine [etodolac]; tylenol [acetaminophen]; iodine; and propoxyphene n-acetaminophen.  [place]  Drug: Mr. Faison  reports that he does not use drugs. Alcohol:  reports that he does not drink alcohol. Tobacco:  reports that he quit smoking about 28 years ago. His smoking use included cigarettes. He started smoking about 49 years ago. He has a 28.50 pack-year smoking history. he has never used smokeless tobacco. Medical:  has a past medical history of Anxiety, Cellulitis (2011), Chronic back pain, Claustrophobia, COPD (chronic obstructive pulmonary disease) ([place]), Diabetes mellitus, type 2 ([place]), Diverticulitis of colon (2008), GERD (gastroesophageal reflux disease), Heart attack ([place]), Hyperlipidemia, Hypertension, Obesity, and Pulmonary nodule. Surgical: Mr. Bohnet  has a past surgical history that includes Appendectomy (2007); Tonsillectomy; Coronary artery bypass graft (N/A, 02/06/2013); and left heart catheterization with coronary angiogram (N/A, 02/03/2013). Family: family history includes Cancer in his mother; Coronary artery disease in his mother and sister; Diabetes in his sister; Heart attack in his brother and father.  Constitutional Exam  General appearance: Well nourished, well developed, and well  hydrated. In no apparent acute distress Vitals:   12/29/16 1320  BP: 140/79  Pulse: 95  Resp: 16  Temp: 97.7 F (36.5 C)  TempSrc: Oral  SpO2: 97%  Weight: 240 lb (108.9 kg)  Height: 5' 9" (1.753 m)   BMI Assessment: Estimated body mass index is 35.44 kg/m as calculated from the following:   Height as of this encounter: 5' 9" (1.753 m).   Weight as of this encounter: 240 lb (108.9 kg). Psych/Mental status: Alert, oriented x 3 (person, place, & time)       Eyes: PERLA Respiratory: Increased wheezing   Cervical Spine Area Exam  Skin & Axial Inspection: No masses, redness, edema, swelling, or associated skin lesions Alignment: Symmetrical Functional ROM: Unrestricted ROM      Stability: No instability detected Muscle Tone/Strength: Functionally intact. No obvious neuro-muscular anomalies detected. Sensory (Neurological): Unimpaired Palpation: No palpable anomalies              Upper Extremity (UE) Exam    Side: Right upper extremity  Side: Left upper extremity  Skin & Extremity Inspection: Skin color, temperature, and hair growth are WNL. No peripheral edema or cyanosis. No masses, redness, swelling, asymmetry, or associated skin lesions. No contractures.  Skin & Extremity Inspection: Skin color, temperature, and hair growth are WNL. No peripheral edema or cyanosis. No masses, redness, swelling, asymmetry, or associated skin lesions. No contractures.  Functional ROM: Unrestricted ROM          Functional ROM: Unrestricted ROM          Muscle Tone/Strength: Functionally intact. No obvious neuro-muscular anomalies detected.  Muscle Tone/Strength: Functionally intact. No obvious neuro-muscular anomalies detected.  Sensory (Neurological): Unimpaired          Sensory (Neurological): Unimpaired          Palpation: No palpable anomalies  Palpation: No palpable anomalies              Specialized Test(s): Deferred         Specialized Test(s): Deferred          Thoracic Spine  Area Exam  Skin & Axial Inspection: No masses, redness, or swelling Alignment: Symmetrical Functional ROM: Unrestricted ROM Stability: No instability detected Muscle Tone/Strength: Functionally intact. No obvious neuro-muscular anomalies detected. Sensory (Neurological): Unimpaired Muscle strength & Tone: No palpable anomalies  Lumbar Spine Area Exam  Skin & Axial Inspection: No masses, redness, or swelling Alignment: Symmetrical Functional ROM: Unrestricted ROM      Stability: No instability detected Muscle Tone/Strength: Functionally intact. No obvious neuro-muscular anomalies detected. Sensory (Neurological): Unimpaired Palpation: No palpable anomalies       Provocative Tests: Lumbar Hyperextension and rotation test: evaluation deferred today       Lumbar Lateral bending test: evaluation deferred today       Patrick's Maneuver: evaluation deferred today                    Gait & Posture Assessment  Ambulation: Unassisted Gait: Relatively normal for age and body habitus Posture: WNL   Lower Extremity Exam    Side: Right lower extremity  Side: Left lower extremity  Skin & Extremity Inspection: Skin color, temperature, and hair growth are WNL. No peripheral edema or cyanosis. No masses, redness, swelling, asymmetry, or associated skin lesions. No contractures.  Skin & Extremity Inspection: Skin color, temperature, and hair growth are WNL. No peripheral edema or cyanosis. No masses, redness, swelling, asymmetry, or associated skin lesions. No contractures.  Functional ROM: Unrestricted ROM          Functional ROM: Unrestricted ROM          Muscle Tone/Strength: Functionally intact. No obvious neuro-muscular anomalies detected.  Muscle Tone/Strength: Functionally intact. No obvious neuro-muscular anomalies detected.  Sensory (Neurological): Unimpaired  Sensory (Neurological): Unimpaired  Palpation: No palpable anomalies  Palpation: No palpable anomalies   Assessment  Primary  Diagnosis & Pertinent Problem List: The primary encounter diagnosis was Long term current use of opiate analgesic. Diagnoses of Chronic low back pain (Location of Secondary source of pain) (Bilateral) (L>R), Chronic knee pain (Location of Primary Source of Pain) (Bilateral) (L>R), Chronic lumbar radicular pain (L5 dermatome) (Right), Chronic pain syndrome, and Chronic sacroiliac joint pain (Bilateral) (L>R) were also pertinent to this visit.  Status Diagnosis  Controlled Controlled Controlled 1. Long term current use of opiate analgesic   2. Chronic low back pain (Location of Secondary source of pain) (Bilateral) (L>R)   3. Chronic knee pain (Location of Primary Source of Pain) (Bilateral) (L>R)   4. Chronic lumbar radicular pain (L5 dermatome) (Right)   5. Chronic pain syndrome   6. Chronic sacroiliac joint pain (Bilateral) (L>R)     Problems updated and reviewed during this visit: No problems updated. Plan of Care  Pharmacotherapy (Medications Ordered): Meds ordered this encounter  Medications  . oxyCODONE (ROXICODONE) 15 MG immediate release tablet    Sig: Take 1 tablet (15 mg total) by mouth every 6 (six) hours as needed for pain.    Dispense:  120 tablet    Refill:  0    Do not add this medication to the electronic "Automatic Refill" notification system. Patient may have prescription filled one day early if pharmacy is closed on scheduled refill date. Do not fill until:03/05/2017 To last until: 04/04/2017    Order Specific Question:  Supervising Provider    Answer:   Milinda Pointer [phone]  . oxyCODONE (ROXICODONE) 15 MG immediate release tablet    Sig: Take 1 tablet (15 mg total) by mouth every 6 (six) hours as needed.    Dispense:  120 tablet    Refill:  0    Do not add this medication to the electronic "Automatic Refill" notification system. Patient may have prescription filled one day early if pharmacy is closed on scheduled refill date. Do not fill until: 02/03/2017 To  last until:03/05/2017    Order Specific Question:   Supervising Provider    Answer:   Milinda Pointer [phone]  . oxyCODONE (ROXICODONE) 15 MG immediate release tablet    Sig: Take 1 tablet (15 mg total) by mouth every 6 (six) hours as needed for pain.    Dispense:  120 tablet    Refill:  0    Do not add this medication to the electronic "Automatic Refill" notification system. Patient may have prescription filled one day early if pharmacy is closed on scheduled refill date. Do not fill until: 01/04/2017 To last until: 02/03/2017    Order Specific Question:   Supervising Provider    Answer:   Milinda Pointer [phone]  This SmartLink is deprecated. Use AVSMEDLIST instead to display the medication list for a patient. Medications administered today: Brain Honeycutt had no medications administered during this visit. Lab-work, procedure(s), and/or referral(s): Orders Placed This Encounter  Procedures  . ToxASSURE Select 13 (MW), Urine   Imaging and/or referral(s): None  Interventional therapies: Planned, scheduled, and/or pending:  Not at this time.    Considering:  Diagnostic bilateralintra-articular knee injection Diagnostic bilateral genicular nerve block Possible bilateral genicular nerve radiofrequency ablation Diagnostic bilateral lumbar facet block Possible bilaterallumbar facet radiofrequency ablation(Left side done on 03/30/2016) Diagnostic left-sided L4-5 lumbar epidural steroid injection Diagnostic bilateral sacroiliac joint block Possible bilateral sacroiliac joint radiofrequency ablation   Palliative PRN treatment(s):  Palliativebilateral intra-articular knee injection Palliativebilateral genicular nerveblock Palliativebilateral lumbar facetblock Palliativeleft-sided L4-5 lumbar epidural steroid injection Palliativebilateral sacroiliac jointblock    Provider-requested follow-up: Return in about 3 months (around 03/29/2017) for MedMgmt with Me  Donella Stade Edison Pace).  Future Appointments  Date Time Provider [place]  01/01/2017 10:30 AM Westly Pam [facility]  03/25/2017  1:30 PM Vevelyn Francois, NP [facility] None   Primary Care Physician: [facility] Location: [facility] Facility Note by: Vevelyn Francois NP Date: 12/29/2016; Time: 3:31 PM  Pain Score Disclaimer: We use the NRS-11 scale. This is a self-reported, subjective measurement of pain severity with only modest accuracy. It is used primarily to identify changes within a particular patient. It must be understood that outpatient pain scales are significantly less accurate that those used for research, where they can be applied under ideal controlled circumstances with minimal exposure to variables. In reality, the score is likely to be a combination of pain intensity and pain affect, where pain affect describes the degree of emotional arousal or changes in action readiness caused by the sensory experience of pain. Factors such as social and work situation, setting, emotional state, anxiety levels, expectation, and prior pain experience may influence pain perception and show large inter-individual differences that may also be affected by time variables.  Patient instructions provided during this appointment: Patient Instructions   ____________________________________________________________________________________________  Medication Rules  Applies to: All patients receiving prescriptions (written or electronic).  Pharmacy of record: Pharmacy where electronic prescriptions will be sent. If  written prescriptions are taken to a different pharmacy, please inform the nursing staff. The pharmacy listed in the electronic medical record should be the one where you would like electronic prescriptions to be sent.  Prescription refills: Only during scheduled appointments. Applies to both, written and electronic  prescriptions.  NOTE: The following applies primarily to controlled substances (Opioid* Pain Medications).   Patient's responsibilities: 1. Pain Pills: Bring all pain pills to every appointment (except for procedure appointments). 2. Pill Bottles: Bring pills in original pharmacy bottle. Always bring newest bottle. Bring bottle, even if empty. 3. Medication refills: You are responsible for knowing and keeping track of what medications you need refilled. The day before your appointment, write a list of all prescriptions that need to be refilled. Bring that list to your appointment and give it to the admitting nurse. Prescriptions will be written only during appointments. If you forget a medication, it will not be "Called in", "Faxed", or "electronically sent". You will need to get another appointment to get these prescribed. 4. Prescription Accuracy: You are responsible for carefully inspecting your prescriptions before leaving our office. Have the discharge nurse carefully go over each prescription with you, before taking them home. Make sure that your name is accurately spelled, that your address is correct. Check the name and dose of your medication to make sure it is accurate. Check the number of pills, and the written instructions to make sure they are clear and accurate. Make sure that you are given enough medication to last until your next medication refill appointment. 5. Taking Medication: Take medication as prescribed. Never take more pills than instructed. Never take medication more frequently than prescribed. Taking less pills or less frequently is permitted and encouraged, when it comes to controlled substances (written prescriptions).  6. Inform other Doctors: Always inform, all of your healthcare providers, of all the medications you take. 7. Pain Medication from other Providers: You are not allowed to accept any additional pain medication from any other Doctor or Healthcare provider. There  are two exceptions to this rule. (see below) In the event that you require additional pain medication, you are responsible for notifying us, as stated below. 8. Medication Agreement: You are responsible for carefully reading and following our Medication Agreement. This must be signed before receiving any prescriptions from our practice. Safely store a copy of your signed Agreement. Violations to the Agreement will result in no further prescriptions. (Additional copies of our Medication Agreement are available upon request.) 9. Laws, Rules, & Regulations: All patients are expected to follow all Federal and [employer], [employer], Rules, [employer]. Ignorance of the Laws does not constitute a valid excuse. The use of any illegal substances is prohibited. 10. Adopted CDC guidelines & recommendations: Target dosing levels will be at or below 60 MME/day. Use of benzodiazepines** is not recommended.  Exceptions: There are only two exceptions to the rule of not receiving pain medications from other Healthcare Providers. 1. Exception #1 (Emergencies): In the event of an emergency (i.e.: accident requiring emergency care), you are allowed to receive additional pain medication. However, you are responsible for: As soon as you are able, call our office [phone], at any time of the day or night, and leave a message stating your name, the date and nature of the emergency, and the name and dose of the medication prescribed. In the event that your call is answered by a member of our staff, make sure to document and save the date, time, and the  name of the person that took your information.  2. Exception #2 (Planned Surgery): In the event that you are scheduled by another doctor or dentist to have any type of surgery or procedure, you are allowed (for a period no longer than 30 days), to receive additional pain medication, for the acute post-op pain. However, in this case, you are responsible for picking up a copy of  our "Post-op Pain Management for Surgeons" handout, and giving it to your surgeon or dentist. This document is available at our office, and does not require an appointment to obtain it. Simply go to our office during business hours (Monday-Thursday from 8:00 AM to 4:00 PM) (Friday 8:00 AM to 12:00 Noon) or if you have a scheduled appointment with [place], prior to your surgery, and ask for it by name. In addition, you will need to provide [place] with your name, name of your surgeon, type of surgery, and date of procedure or surgery.  *Opioid medications include: morphine, codeine, oxycodone, oxymorphone, hydrocodone, hydromorphone, meperidine, tramadol, tapentadol, buprenorphine, fentanyl, methadone. **Benzodiazepine medications include: diazepam (Valium), alprazolam (Xanax), clonazepam (Klonopine), lorazepam (Ativan), clorazepate (Tranxene), chlordiazepoxide (Librium), estazolam (Prosom), oxazepam (Serax), temazepam (Restoril), triazolam (Halcion)  ____________________________________________________________________________________________

## 2016-12-29 NOTE — Progress Notes (Signed)
Nursing Pain Medication Assessment:  Safety precautions to be maintained throughout the outpatient stay will include: orient to surroundings, keep bed in low position, maintain call bell within reach at all times, provide assistance with transfer out of bed and ambulation.  Medication Inspection Compliance: Pill count conducted under aseptic conditions, in front of the patient. Neither the pills nor the bottle was removed from the patient's sight at any time. Once count was completed pills were immediately returned to the patient in their original bottle.  Medication: See above Pill/Patch Count: 20 of 120 pills remain Pill/Patch Appearance: Markings consistent with prescribed medication Bottle Appearance: Standard pharmacy container. Clearly labeled. Filled Date: 27 / 24 / 2018 Last Medication intake:  Today

## 2016-12-29 NOTE — Patient Instructions (Signed)

## 2017-01-01 ENCOUNTER — Other Ambulatory Visit: Payer: Self-pay | Admitting: *Deleted

## 2017-01-01 ENCOUNTER — Encounter: Payer: Self-pay | Admitting: *Deleted

## 2017-01-01 ENCOUNTER — Ambulatory Visit (INDEPENDENT_AMBULATORY_CARE_PROVIDER_SITE_OTHER): Payer: Medicare Other | Admitting: Gastroenterology

## 2017-01-01 ENCOUNTER — Other Ambulatory Visit: Payer: Self-pay

## 2017-01-01 ENCOUNTER — Encounter: Payer: Self-pay | Admitting: Gastroenterology

## 2017-01-01 VITALS — BP 152/85 | HR 79 | Temp 96.9°F | Ht 69.0 in | Wt 267.6 lb

## 2017-01-01 DIAGNOSIS — K219 Gastro-esophageal reflux disease without esophagitis: Secondary | ICD-10-CM

## 2017-01-01 DIAGNOSIS — Z1211 Encounter for screening for malignant neoplasm of colon: Secondary | ICD-10-CM

## 2017-01-01 DIAGNOSIS — R110 Nausea: Secondary | ICD-10-CM

## 2017-01-01 MED ORDER — NA SULFATE-K SULFATE-MG SULF 17.5-3.13-1.6 GM/177ML PO SOLN: 1.0000 | ORAL | 0 refills | Status: DC

## 2017-01-01 NOTE — Patient Instructions (Signed)
1. Colonoscopy and upper endoscopy as scheduled. See separate instructions.  
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
## 2017-01-02 DIAGNOSIS — Z1211 Encounter for screening for malignant neoplasm of colon: Secondary | ICD-10-CM | POA: Insufficient documentation

## 2017-01-02 LAB — TOXASSURE SELECT 13 (MW), URINE

## 2017-01-02 NOTE — Assessment & Plan Note (Signed)
Screening colonoscopy with deep sedation due to polypharmacy and chronic narcotic use.  I have discussed the risks, alternatives, benefits with regards to but not limited to the risk of reaction to medication, bleeding, infection, perforation and the patient is agreeable to proceed. Written consent to be obtained.

## 2017-01-02 NOTE — Assessment & Plan Note (Signed)
Poorly controlled heartburn, abdominal pain (generalized and vague). Failed prilosec, nexium pantoprazole. Recommend EGD with deep sedation in near future.  I have discussed the risks, alternatives, benefits with regards to but not limited to the risk of reaction to medication, bleeding, infection, perforation and the patient is agreeable to proceed. Written consent to be obtained.  Patient does not want to change PPI until after EGD.

## 2017-01-04 ENCOUNTER — Telehealth: Payer: Self-pay

## 2017-01-04 NOTE — Progress Notes (Signed)
cc'd to pcp 

## 2017-01-04 NOTE — Telephone Encounter (Signed)
Called and informed pt of pre-op appt 02/16/17 at 10:00am. He has another appt that day. Will reschedule and call him back.

## 2017-01-07 NOTE — Telephone Encounter (Signed)
Pre-op appt rescheduled for 02/17/17 at 10:00am. Called and informed pt. Letter mailed.

## 2017-01-20 ENCOUNTER — Other Ambulatory Visit: Payer: Self-pay | Admitting: Nurse Practitioner

## 2017-02-14 NOTE — Patient Instructions (Signed)
Manuel Knight  02/14/2017     @PREFPERIOPPHARMACY @   Your procedure is scheduled on  02/23/2017   Report to [facility] at  745  A.M.  Call this number if you have problems the morning of surgery:  [phone]   Remember:  Do not eat food or drink liquids after midnight.  Take these medicines the morning of surgery with A SIP OF WATER  Xanax, norvasc, adderall, coreg, entresto, celexa, catapress, neurontin, mobic, oxycodone, protonix. Use your inhaler and your nebulizer before you come. Take 25 units of your nighttime insulin. DO NOT take any medications for diabetes the morning of your procedure.   Do not wear jewelry, make-up or nail polish.  Do not wear lotions, powders, or perfumes, or deodorant.  Do not shave 48 hours prior to surgery.  Men may shave face and neck.  Do not bring valuables to the hospital.  [facility] is not responsible for any belongings or valuables.  Contacts, dentures or bridgework may not be worn into surgery.  Leave your suitcase in the car.  After surgery it may be brought to your room.  For patients admitted to the hospital, discharge time will be determined by your treatment team.  Patients discharged the day of surgery will not be allowed to drive home.   Name and phone number of your driver:   family Special instructions:  Follow the diet and prep instructions given to you by Dr Nona Dell office.  Please read over the following fact sheets that you were given. Anesthesia Post-op Instructions and Care and Recovery After Surgery       Esophagogastroduodenoscopy Esophagogastroduodenoscopy (EGD) is a procedure to examine the lining of the esophagus, stomach, and first part of the small intestine (duodenum). This procedure is done to check for problems such as inflammation, bleeding, ulcers, or growths. During this procedure, a long, flexible, lighted tube with a camera attached (endoscope) is inserted down the throat. Tell a  health care provider about:  Any allergies you have.  All medicines you are taking, including vitamins, herbs, eye drops, creams, and over-the-counter medicines.  Any problems you or family members have had with anesthetic medicines.  Any blood disorders you have.  Any surgeries you have had.  Any medical conditions you have.  Whether you are pregnant or may be pregnant. What are the risks? Generally, this is a safe procedure. However, problems may occur, including:  Infection.  Bleeding.  A tear (perforation) in the esophagus, stomach, or duodenum.  Trouble breathing.  Excessive sweating.  Spasms of the larynx.  A slowed heartbeat.  Low blood pressure.  What happens before the procedure?  Follow instructions from your health care provider about eating or drinking restrictions.  Ask your health care provider about: ? Changing or stopping your regular medicines. This is especially important if you are taking diabetes medicines or blood thinners. ? Taking medicines such as aspirin and ibuprofen. These medicines can thin your blood. Do not take these medicines before your procedure if your health care provider instructs you not to.  Plan to have someone take you home after the procedure.  If you wear dentures, be ready to remove them before the procedure. What happens during the procedure?  To reduce your risk of infection, your health care team will wash or sanitize their hands.  An IV tube will be put in a vein in your hand or arm. You will  get medicines and fluids through this tube.  You will be given one or more of the following: ? A medicine to help you relax (sedative). ? A medicine to numb the area (local anesthetic). This medicine may be sprayed into your throat. It will make you feel more comfortable and keep you from gagging or coughing during the procedure. ? A medicine for pain.  A mouth guard may be placed in your mouth to protect your teeth and to  keep you from biting on the endoscope.  You will be asked to lie on your left side.  The endoscope will be lowered down your throat into your esophagus, stomach, and duodenum.  Air will be put into the endoscope. This will help your health care provider see better.  The lining of your esophagus, stomach, and duodenum will be examined.  Your health care provider may: ? Take a tissue sample so it can be looked at in a lab (biopsy). ? Remove growths. ? Remove objects (foreign bodies) that are stuck. ? Treat any bleeding with medicines or other devices that stop tissue from bleeding. ? Widen (dilate) or stretch narrowed areas of your esophagus and stomach.  The endoscope will be taken out. The procedure may vary among health care providers and hospitals. What happens after the procedure?  Your blood pressure, heart rate, breathing rate, and blood oxygen level will be monitored often until the medicines you were given have worn off.  Do not eat or drink anything until the numbing medicine has worn off and your gag reflex has returned. This information is not intended to replace advice given to you by your health care provider. Make sure you discuss any questions you have with your health care provider. Document Released: 05/01/2004 Document Revised: 06/06/2015 Document Reviewed: 11/22/2014 Elsevier Interactive Patient Education  2018 [employer]. Esophagogastroduodenoscopy, Care After Refer to this sheet in the next few weeks. These instructions provide you with information about caring for yourself after your procedure. Your health care provider may also give you more specific instructions. Your treatment has been planned according to current medical practices, but problems sometimes occur. Call your health care provider if you have any problems or questions after your procedure. What can I expect after the procedure? After the procedure, it is common to have:  A sore  throat.  Nausea.  Bloating.  Dizziness.  Fatigue.  Follow these instructions at home:  Do not eat or drink anything until the numbing medicine (local anesthetic) has worn off and your gag reflex has returned. You will know that the local anesthetic has worn off when you can swallow comfortably.  Do not drive for 24 hours if you received a medicine to help you relax (sedative).  If your health care provider took a tissue sample for testing during the procedure, make sure to get your test results. This is your responsibility. Ask your health care provider or the department performing the test when your results will be ready.  Keep all follow-up visits as told by your health care provider. This is important. Contact a health care provider if:  You cannot stop coughing.  You are not urinating.  You are urinating less than usual. Get help right away if:  You have trouble swallowing.  You cannot eat or drink.  You have throat or chest pain that gets worse.  You are dizzy or light-headed.  You faint.  You have nausea or vomiting.  You have chills.  You have a fever.  You have severe abdominal pain.  You have black, tarry, or bloody stools. This information is not intended to replace advice given to you by your health care provider. Make sure you discuss any questions you have with your health care provider. Document Released: 12/16/2011 Document Revised: 06/06/2015 Document Reviewed: 11/22/2014 Elsevier Interactive Patient Education  2018 [employer].  Colonoscopy, Adult A colonoscopy is an exam to look at the large intestine. It is done to check for problems, such as:  Lumps (tumors).  Growths (polyps).  Swelling (inflammation).  Bleeding.  What happens before the procedure? Eating and drinking Follow instructions from your doctor about eating and drinking. These instructions may include:  A few days before the procedure - follow a low-fiber  diet. ? Avoid nuts. ? Avoid seeds. ? Avoid dried fruit. ? Avoid raw fruits. ? Avoid vegetables.  1-3 days before the procedure - follow a clear liquid diet. Avoid liquids that have red or purple dye. Drink only clear liquids, such as: ? Clear broth or bouillon. ? Black coffee or tea. ? Clear juice. ? Clear soft drinks or sports drinks. ? Gelatin dessert. ? Popsicles.  On the day of the procedure - do not eat or drink anything during the 2 hours before the procedure.  Bowel prep If you were prescribed an oral bowel prep:  Take it as told by your doctor. Starting the day before your procedure, you will need to drink a lot of liquid. The liquid will cause you to poop (have bowel movements) until your poop is almost clear or light green.  If your skin or butt gets irritated from diarrhea, you may: ? Wipe the area with wipes that have medicine in them, such as adult wet wipes with aloe and vitamin E. ? Put something on your skin that soothes the area, such as petroleum jelly.  If you throw up (vomit) while drinking the bowel prep, take a break for up to 60 minutes. Then begin the bowel prep again. If you keep throwing up and you cannot take the bowel prep without throwing up, call your doctor.  General instructions  Ask your doctor about changing or stopping your normal medicines. This is important if you take diabetes medicines or blood thinners.  Plan to have someone take you home from the hospital or clinic. What happens during the procedure?  An IV tube may be put into one of your veins.  You will be given medicine to help you relax (sedative).  To reduce your risk of infection: ? Your doctors will wash their hands. ? Your anal area will be washed with soap.  You will be asked to lie on your side with your knees bent.  Your doctor will get a long, thin, flexible tube ready. The tube will have a camera and a light on the end.  The tube will be put into your anus.  The  tube will be gently put into your large intestine.  Air will be delivered into your large intestine to keep it open. You may feel some pressure or cramping.  The camera will be used to take photos.  A small tissue sample may be removed from your body to be looked at under a microscope (biopsy). If any possible problems are found, the tissue will be sent to a lab for testing.  If small growths are found, your doctor may remove them and have them checked for cancer.  The tube that was put into your anus will be slowly  removed. The procedure may vary among doctors and hospitals. What happens after the procedure?  Your doctor will check on you often until the medicines you were given have worn off.  Do not drive for 24 hours after the procedure.  You may have a small amount of blood in your poop.  You may pass gas.  You may have mild cramps or bloating in your belly (abdomen).  It is up to you to get the results of your procedure. Ask your doctor, or the department performing the procedure, when your results will be ready. This information is not intended to replace advice given to you by your health care provider. Make sure you discuss any questions you have with your health care provider. Document Released: 01/31/2010 Document Revised: 10/30/2015 Document Reviewed: 03/12/2015 Elsevier Interactive Patient Education  2017 [employer]  Colonoscopy, Adult, Care After This sheet gives you information about how to care for yourself after your procedure. Your health care provider may also give you more specific instructions. If you have problems or questions, contact your health care provider. What can I expect after the procedure? After the procedure, it is common to have:  A small amount of blood in your stool for 24 hours after the procedure.  Some gas.  Mild abdominal cramping or bloating.  Follow these instructions at home: General instructions   For the first 24 hours  after the procedure: ? Do not drive or use machinery. ? Do not sign important documents. ? Do not drink alcohol. ? Do your regular daily activities at a slower pace than normal. ? Eat soft, easy-to-digest foods. ? Rest often.  Take over-the-counter or prescription medicines only as told by your health care provider.  It is up to you to get the results of your procedure. Ask your health care provider, or the department performing the procedure, when your results will be ready. Relieving cramping and bloating  Try walking around when you have cramps or feel bloated.  Apply heat to your abdomen as told by your health care provider. Use a heat source that your health care provider recommends, such as a moist heat pack or a heating pad. ? Place a towel between your skin and the heat source. ? Leave the heat on for 20-30 minutes. ? Remove the heat if your skin turns bright red. This is especially important if you are unable to feel pain, heat, or cold. You may have a greater risk of getting burned. Eating and drinking  Drink enough fluid to keep your urine clear or pale yellow.  Resume your normal diet as instructed by your health care provider. Avoid heavy or fried foods that are hard to digest.  Avoid drinking alcohol for as long as instructed by your health care provider. Contact a health care provider if:  You have blood in your stool 2-3 days after the procedure. Get help right away if:  You have more than a small spotting of blood in your stool.  You pass large blood clots in your stool.  Your abdomen is swollen.  You have nausea or vomiting.  You have a fever.  You have increasing abdominal pain that is not relieved with medicine. This information is not intended to replace advice given to you by your health care provider. Make sure you discuss any questions you have with your health care provider. Document Released: 08/13/2003 Document Revised: 09/23/2015 Document  Reviewed: 03/12/2015 Elsevier Interactive Patient Education  2018 [employer]  Anesthesia is a term that refers to techniques, procedures, and medicines that help a person stay safe and comfortable during a medical procedure. Monitored anesthesia care, or sedation, is one type of anesthesia. Your anesthesia specialist may recommend sedation if you will be having a procedure that does not require you to be unconscious, such as:  Cataract surgery.  A dental procedure.  A biopsy.  A colonoscopy.  During the procedure, you may receive a medicine to help you relax (sedative). There are three levels of sedation:  Mild sedation. At this level, you may feel awake and relaxed. You will be able to follow directions.  Moderate sedation. At this level, you will be sleepy. You may not remember the procedure.  Deep sedation. At this level, you will be asleep. You will not remember the procedure.  The more medicine you are given, the deeper your level of sedation will be. Depending on how you respond to the procedure, the anesthesia specialist may change your level of sedation or the type of anesthesia to fit your needs. An anesthesia specialist will monitor you closely during the procedure. Let your health care provider know about:  Any allergies you have.  All medicines you are taking, including vitamins, herbs, eye drops, creams, and over-the-counter medicines.  Any use of steroids (by mouth or as a cream).  Any problems you or family members have had with sedatives and anesthetic medicines.  Any blood disorders you have.  Any surgeries you have had.  Any medical conditions you have, such as sleep apnea.  Whether you are pregnant or may be pregnant.  Any use of cigarettes, alcohol, or street drugs. What are the risks? Generally, this is a safe procedure. However, problems may occur, including:  Getting too much medicine  (oversedation).  Nausea.  Allergic reaction to medicines.  Trouble breathing. If this happens, a breathing tube may be used to help with breathing. It will be removed when you are awake and breathing on your own.  Heart trouble.  Lung trouble.  Before the procedure Staying hydrated Follow instructions from your health care provider about hydration, which may include:  Up to 2 hours before the procedure - you may continue to drink clear liquids, such as water, clear fruit juice, black coffee, and plain tea.  Eating and drinking restrictions Follow instructions from your health care provider about eating and drinking, which may include:  8 hours before the procedure - stop eating heavy meals or foods such as meat, fried foods, or fatty foods.  6 hours before the procedure - stop eating light meals or foods, such as toast or cereal.  6 hours before the procedure - stop drinking milk or drinks that contain milk.  2 hours before the procedure - stop drinking clear liquids.  Medicines Ask your health care provider about:  Changing or stopping your regular medicines. This is especially important if you are taking diabetes medicines or blood thinners.  Taking medicines such as aspirin and ibuprofen. These medicines can thin your blood. Do not take these medicines before your procedure if your health care provider instructs you not to.  Tests and exams  You will have a physical exam.  You may have blood tests done to show: ? How well your kidneys and liver are working. ? How well your blood can clot.  General instructions  Plan to have someone take you home from the hospital or clinic.  If you will be going home right after the procedure, plan to  have someone with you for 24 hours.  What happens during the procedure?  Your blood pressure, heart rate, breathing, level of pain and overall condition will be monitored.  An IV tube will be inserted into one of your  veins.  Your anesthesia specialist will give you medicines as needed to keep you comfortable during the procedure. This may mean changing the level of sedation.  The procedure will be performed. After the procedure  Your blood pressure, heart rate, breathing rate, and blood oxygen level will be monitored until the medicines you were given have worn off.  Do not drive for 24 hours if you received a sedative.  You may: ? Feel sleepy, clumsy, or nauseous. ? Feel forgetful about what happened after the procedure. ? Have a sore throat if you had a breathing tube during the procedure. ? Vomit. This information is not intended to replace advice given to you by your health care provider. Make sure you discuss any questions you have with your health care provider. Document Released: 09/24/2004 Document Revised: 06/07/2015 Document Reviewed: 04/21/2015 Elsevier Interactive Patient Education  2018 [place], Care After These instructions provide you with information about caring for yourself after your procedure. Your health care provider may also give you more specific instructions. Your treatment has been planned according to current medical practices, but problems sometimes occur. Call your health care provider if you have any problems or questions after your procedure. What can I expect after the procedure? After your procedure, it is common to:  Feel sleepy for several hours.  Feel clumsy and have poor balance for several hours.  Feel forgetful about what happened after the procedure.  Have poor judgment for several hours.  Feel nauseous or vomit.  Have a sore throat if you had a breathing tube during the procedure.  Follow these instructions at home: For at least 24 hours after the procedure:   Do not: ? Participate in activities in which you could fall or become injured. ? Drive. ? Use heavy machinery. ? Drink alcohol. ? Take sleeping pills or  medicines that cause drowsiness. ? Make important decisions or sign legal documents. ? Take care of children on your own.  Rest. Eating and drinking  Follow the diet that is recommended by your health care provider.  If you vomit, drink water, juice, or soup when you can drink without vomiting.  Make sure you have little or no nausea before eating solid foods. General instructions  Have a responsible adult stay with you until you are awake and alert.  Take over-the-counter and prescription medicines only as told by your health care provider.  If you smoke, do not smoke without supervision.  Keep all follow-up visits as told by your health care provider. This is important. Contact a health care provider if:  You keep feeling nauseous or you keep vomiting.  You feel light-headed.  You develop a rash.  You have a fever. Get help right away if:  You have trouble breathing. This information is not intended to replace advice given to you by your health care provider. Make sure you discuss any questions you have with your health care provider. Document Released: 04/21/2015 Document Revised: 08/21/2015 Document Reviewed: 04/21/2015 Elsevier Interactive Patient Education  [employer].

## 2017-02-16 ENCOUNTER — Other Ambulatory Visit (HOSPITAL_COMMUNITY): Payer: Medicare Other

## 2017-02-17 ENCOUNTER — Other Ambulatory Visit: Payer: Self-pay

## 2017-02-17 ENCOUNTER — Encounter (HOSPITAL_COMMUNITY): Payer: Self-pay

## 2017-02-17 ENCOUNTER — Encounter (HOSPITAL_COMMUNITY)
Admission: RE | Admit: 2017-02-17 | Discharge: 2017-02-17 | Disposition: A | Discharge status: Home / Self Care | Payer: Medicare Other | Source: Ambulatory Visit | Attending: Gastroenterology | Admitting: Gastroenterology

## 2017-02-17 DIAGNOSIS — R9431 Abnormal electrocardiogram [ECG] [EKG]: Secondary | ICD-10-CM | POA: Diagnosis not present

## 2017-02-17 DIAGNOSIS — Z01810 Encounter for preprocedural cardiovascular examination: Secondary | ICD-10-CM | POA: Diagnosis not present

## 2017-02-17 DIAGNOSIS — Z01818 Encounter for other preprocedural examination: Secondary | ICD-10-CM | POA: Insufficient documentation

## 2017-02-17 DIAGNOSIS — R110 Nausea: Secondary | ICD-10-CM | POA: Diagnosis not present

## 2017-02-17 DIAGNOSIS — K219 Gastro-esophageal reflux disease without esophagitis: Secondary | ICD-10-CM | POA: Insufficient documentation

## 2017-02-17 DIAGNOSIS — Z1211 Encounter for screening for malignant neoplasm of colon: Secondary | ICD-10-CM | POA: Diagnosis not present

## 2017-02-17 HISTORY — DX: Personal history of urinary calculi: Z87.442

## 2017-02-17 HISTORY — DX: Unspecified osteoarthritis, unspecified site: M19.90

## 2017-02-17 LAB — CBC WITH DIFFERENTIAL/PLATELET
BASOS ABS: 0.1 10*3/uL (ref 0.0–0.1)
BASOS PCT: 0 %
EOS ABS: 0.3 10*3/uL (ref 0.0–0.7)
EOS PCT: 2 %
HCT: 46.7 % (ref 39.0–52.0)
Hemoglobin: 14.9 g/dL (ref 13.0–17.0)
LYMPHS PCT: 13 %
Lymphs Abs: 1.8 10*3/uL (ref 0.7–4.0)
MCH: 29.7 pg (ref 26.0–34.0)
MCHC: 31.9 g/dL (ref 30.0–36.0)
MCV: 93 fL (ref 78.0–100.0)
Monocytes Absolute: 1.2 10*3/uL — ABNORMAL HIGH (ref 0.1–1.0)
Monocytes Relative: 9 %
Neutro Abs: 10.9 10*3/uL — ABNORMAL HIGH (ref 1.7–7.7)
Neutrophils Relative %: 76 %
PLATELETS: 267 10*3/uL (ref 150–400)
RBC: 5.02 MIL/uL (ref 4.22–5.81)
RDW: 13.9 % (ref 11.5–15.5)
WBC: 14.2 10*3/uL — AB (ref 4.0–10.5)

## 2017-02-17 LAB — BASIC METABOLIC PANEL
ANION GAP: 16 — AB (ref 5–15)
BUN: 17 mg/dL (ref 6–20)
CHLORIDE: 103 mmol/L (ref 101–111)
CO2: 20 mmol/L — ABNORMAL LOW (ref 22–32)
Calcium: 9.5 mg/dL (ref 8.9–10.3)
Creatinine, Ser: 0.95 mg/dL (ref 0.61–1.24)
GFR calc Af Amer: >60 mL/min (ref >60)
GFR calc non Af Amer: >60 mL/min (ref >60)
GLUCOSE: 243 mg/dL — AB (ref 65–99)
POTASSIUM: 4.8 mmol/L (ref 3.5–5.1)
SODIUM: 139 mmol/L (ref 135–145)

## 2017-02-22 ENCOUNTER — Telehealth: Payer: Self-pay | Admitting: Gastroenterology

## 2017-02-22 ENCOUNTER — Encounter: Payer: Self-pay | Admitting: *Deleted

## 2017-02-22 NOTE — Telephone Encounter (Signed)
Per carolyn they will do a pre-op phone call on 03/19/17. Pt aware

## 2017-02-22 NOTE — Telephone Encounter (Signed)
Patient called. He is scheduled for his procedure tomorrow and did not start prepping as instructed. He reports he did not pick up prep today and that is when he found his instructions. Patient is now reschedule until 03/23/17 at 12:30pm. New prep instructions mailed to pt.

## 2017-02-22 NOTE — Telephone Encounter (Signed)
[phone] PATIENT CALLED AND STATED THAT HE JUST GOT HIS PREP TODAY AND HE WAS SUPPOSED TO START YESTERDAY.  PLEASE CALL HIM

## 2017-03-19 ENCOUNTER — Telehealth: Payer: Self-pay | Admitting: Gastroenterology

## 2017-03-19 ENCOUNTER — Encounter (HOSPITAL_COMMUNITY)
Admission: RE | Admit: 2017-03-19 | Discharge: 2017-03-19 | Disposition: A | Discharge status: Home / Self Care | Payer: Medicare Other | Source: Ambulatory Visit | Attending: Gastroenterology | Admitting: Gastroenterology

## 2017-03-19 NOTE — Telephone Encounter (Signed)
Pt needs to reschedule his pre op and procedure scheduled with [facility] on 3/12 due to his working out of town. Please call him at [phone]

## 2017-03-19 NOTE — Telephone Encounter (Signed)
Spoke with patient and he is scheduled to come back in for OV to r/s TCS since he was last seen in 12/2016. Called [place] and made aware. Fyi to [facility]

## 2017-03-22 ENCOUNTER — Other Ambulatory Visit: Payer: Self-pay

## 2017-03-22 ENCOUNTER — Ambulatory Visit: Payer: Medicare Other | Attending: Nurse Practitioner | Admitting: Nurse Practitioner

## 2017-03-22 ENCOUNTER — Encounter: Payer: Self-pay | Admitting: Nurse Practitioner

## 2017-03-22 VITALS — BP 141/69 | HR 74 | Temp 98.1°F | Resp 18 | Ht 69.0 in | Wt 242.0 lb

## 2017-03-22 DIAGNOSIS — Z791 Long term (current) use of non-steroidal anti-inflammatories (NSAID): Secondary | ICD-10-CM | POA: Diagnosis not present

## 2017-03-22 DIAGNOSIS — M4726 Other spondylosis with radiculopathy, lumbar region: Secondary | ICD-10-CM

## 2017-03-22 DIAGNOSIS — K219 Gastro-esophageal reflux disease without esophagitis: Secondary | ICD-10-CM | POA: Insufficient documentation

## 2017-03-22 DIAGNOSIS — I5022 Chronic systolic (congestive) heart failure: Secondary | ICD-10-CM | POA: Insufficient documentation

## 2017-03-22 DIAGNOSIS — E669 Obesity, unspecified: Secondary | ICD-10-CM | POA: Diagnosis not present

## 2017-03-22 DIAGNOSIS — Z809 Family history of malignant neoplasm, unspecified: Secondary | ICD-10-CM | POA: Insufficient documentation

## 2017-03-22 DIAGNOSIS — K5732 Diverticulitis of large intestine without perforation or abscess without bleeding: Secondary | ICD-10-CM | POA: Insufficient documentation

## 2017-03-22 DIAGNOSIS — G8929 Other chronic pain: Secondary | ICD-10-CM | POA: Diagnosis not present

## 2017-03-22 DIAGNOSIS — M47816 Spondylosis without myelopathy or radiculopathy, lumbar region: Secondary | ICD-10-CM | POA: Diagnosis not present

## 2017-03-22 DIAGNOSIS — M533 Sacrococcygeal disorders, not elsewhere classified: Secondary | ICD-10-CM

## 2017-03-22 DIAGNOSIS — I2510 Atherosclerotic heart disease of native coronary artery without angina pectoris: Secondary | ICD-10-CM | POA: Diagnosis not present

## 2017-03-22 DIAGNOSIS — F1290 Cannabis use, unspecified, uncomplicated: Secondary | ICD-10-CM | POA: Insufficient documentation

## 2017-03-22 DIAGNOSIS — Z79899 Other long term (current) drug therapy: Secondary | ICD-10-CM | POA: Insufficient documentation

## 2017-03-22 DIAGNOSIS — Z951 Presence of aortocoronary bypass graft: Secondary | ICD-10-CM | POA: Insufficient documentation

## 2017-03-22 DIAGNOSIS — E559 Vitamin D deficiency, unspecified: Secondary | ICD-10-CM | POA: Diagnosis not present

## 2017-03-22 DIAGNOSIS — Z9889 Other specified postprocedural states: Secondary | ICD-10-CM | POA: Insufficient documentation

## 2017-03-22 DIAGNOSIS — E785 Hyperlipidemia, unspecified: Secondary | ICD-10-CM | POA: Diagnosis not present

## 2017-03-22 DIAGNOSIS — M170 Bilateral primary osteoarthritis of knee: Secondary | ICD-10-CM | POA: Insufficient documentation

## 2017-03-22 DIAGNOSIS — Z888 Allergy status to other drugs, medicaments and biological substances status: Secondary | ICD-10-CM | POA: Insufficient documentation

## 2017-03-22 DIAGNOSIS — F411 Generalized anxiety disorder: Secondary | ICD-10-CM | POA: Diagnosis not present

## 2017-03-22 DIAGNOSIS — E1165 Type 2 diabetes mellitus with hyperglycemia: Secondary | ICD-10-CM | POA: Insufficient documentation

## 2017-03-22 DIAGNOSIS — Z833 Family history of diabetes mellitus: Secondary | ICD-10-CM | POA: Insufficient documentation

## 2017-03-22 DIAGNOSIS — Z1211 Encounter for screening for malignant neoplasm of colon: Secondary | ICD-10-CM | POA: Insufficient documentation

## 2017-03-22 DIAGNOSIS — M79671 Pain in right foot: Secondary | ICD-10-CM | POA: Insufficient documentation

## 2017-03-22 DIAGNOSIS — G894 Chronic pain syndrome: Secondary | ICD-10-CM | POA: Diagnosis present

## 2017-03-22 DIAGNOSIS — Z7982 Long term (current) use of aspirin: Secondary | ICD-10-CM | POA: Insufficient documentation

## 2017-03-22 DIAGNOSIS — Z886 Allergy status to analgesic agent status: Secondary | ICD-10-CM | POA: Insufficient documentation

## 2017-03-22 DIAGNOSIS — Z794 Long term (current) use of insulin: Secondary | ICD-10-CM | POA: Diagnosis not present

## 2017-03-22 DIAGNOSIS — I214 Non-ST elevation (NSTEMI) myocardial infarction: Secondary | ICD-10-CM | POA: Insufficient documentation

## 2017-03-22 DIAGNOSIS — Z87442 Personal history of urinary calculi: Secondary | ICD-10-CM | POA: Insufficient documentation

## 2017-03-22 DIAGNOSIS — J449 Chronic obstructive pulmonary disease, unspecified: Secondary | ICD-10-CM | POA: Insufficient documentation

## 2017-03-22 DIAGNOSIS — Z79891 Long term (current) use of opiate analgesic: Secondary | ICD-10-CM | POA: Insufficient documentation

## 2017-03-22 DIAGNOSIS — Z8249 Family history of ischemic heart disease and other diseases of the circulatory system: Secondary | ICD-10-CM | POA: Insufficient documentation

## 2017-03-22 DIAGNOSIS — I110 Hypertensive heart disease with heart failure: Secondary | ICD-10-CM | POA: Insufficient documentation

## 2017-03-22 DIAGNOSIS — Z87891 Personal history of nicotine dependence: Secondary | ICD-10-CM | POA: Insufficient documentation

## 2017-03-22 DIAGNOSIS — I252 Old myocardial infarction: Secondary | ICD-10-CM | POA: Insufficient documentation

## 2017-03-22 MED ORDER — OXYCODONE HCL 15 MG PO TABS: 15.0000 mg | ORAL_TABLET | Freq: Four times a day (QID) | ORAL | 0 refills | Status: DC | PRN

## 2017-03-22 NOTE — Patient Instructions (Addendum)
____________________________________________________________________________________________  Medication Rules  Applies to: All patients receiving prescriptions (written or electronic).  Pharmacy of record: Pharmacy where electronic prescriptions will be sent. If written prescriptions are taken to a different pharmacy, please inform the nursing staff. The pharmacy listed in the electronic medical record should be the one where you would like electronic prescriptions to be sent.  Prescription refills: Only during scheduled appointments. Applies to both, written and electronic prescriptions.  NOTE: The following applies primarily to controlled substances (Opioid* Pain Medications).   Patient's responsibilities: 1. Pain Pills: Bring all pain pills to every appointment (except for procedure appointments). 2. Pill Bottles: Bring pills in original pharmacy bottle. Always bring newest bottle. Bring bottle, even if empty. 3. Medication refills: You are responsible for knowing and keeping track of what medications you need refilled. The day before your appointment, write a list of all prescriptions that need to be refilled. Bring that list to your appointment and give it to the admitting nurse. Prescriptions will be written only during appointments. If you forget a medication, it will not be "Called in", "Faxed", or "electronically sent". You will need to get another appointment to get these prescribed. 4. Prescription Accuracy: You are responsible for carefully inspecting your prescriptions before leaving our office. Have the discharge nurse carefully go over each prescription with you, before taking them home. Make sure that your name is accurately spelled, that your address is correct. Check the name and dose of your medication to make sure it is accurate. Check the number of pills, and the written instructions to make sure they are clear and accurate. Make sure that you are given enough medication to last  until your next medication refill appointment. 5. Taking Medication: Take medication as prescribed. Never take more pills than instructed. Never take medication more frequently than prescribed. Taking less pills or less frequently is permitted and encouraged, when it comes to controlled substances (written prescriptions).  6. Inform other Doctors: Always inform, all of your healthcare providers, of all the medications you take. 7. Pain Medication from other Providers: You are not allowed to accept any additional pain medication from any other Doctor or Healthcare provider. There are two exceptions to this rule. (see below) In the event that you require additional pain medication, you are responsible for notifying us, as stated below. 8. Medication Agreement: You are responsible for carefully reading and following our Medication Agreement. This must be signed before receiving any prescriptions from our practice. Safely store a copy of your signed Agreement. Violations to the Agreement will result in no further prescriptions. (Additional copies of our Medication Agreement are available upon request.) 9. Laws, Rules, & Regulations: All patients are expected to follow all Federal and State Laws, Statutes, Rules, & Regulations. Ignorance of the Laws does not constitute a valid excuse. The use of any illegal substances is prohibited. 10. Adopted CDC guidelines & recommendations: Target dosing levels will be at or below 60 MME/day. Use of benzodiazepines** is not recommended.  Exceptions: There are only two exceptions to the rule of not receiving pain medications from other Healthcare Providers. 1. Exception #1 (Emergencies): In the event of an emergency (i.e.: accident requiring emergency care), you are allowed to receive additional pain medication. However, you are responsible for: As soon as you are able, call our office [phone], at any time of the day or night, and leave a message stating your name, the  date and nature of the emergency, and the name and dose of the medication   prescribed. In the event that your call is answered by a member of our staff, make sure to document and save the date, time, and the name of the person that took your information.  2. Exception #2 (Planned Surgery): In the event that you are scheduled by another doctor or dentist to have any type of surgery or procedure, you are allowed (for a period no longer than 30 days), to receive additional pain medication, for the acute post-op pain. However, in this case, you are responsible for picking up a copy of our "Post-op Pain Management for Surgeons" handout, and giving it to your surgeon or dentist. This document is available at our office, and does not require an appointment to obtain it. Simply go to our office during business hours (Monday-Thursday from 8:00 AM to 4:00 PM) (Friday 8:00 AM to 12:00 Noon) or if you have a scheduled appointment with [place], prior to your surgery, and ask for it by name. In addition, you will need to provide [place] with your name, name of your surgeon, type of surgery, and date of procedure or surgery.  *Opioid medications include: morphine, codeine, oxycodone, oxymorphone, hydrocodone, hydromorphone, meperidine, tramadol, tapentadol, buprenorphine, fentanyl, methadone. **Benzodiazepine medications include: diazepam (Valium), alprazolam (Xanax), clonazepam (Klonopine), lorazepam (Ativan), clorazepate (Tranxene), chlordiazepoxide (Librium), estazolam (Prosom), oxazepam (Serax), temazepam (Restoril), triazolam (Halcion) (Last updated: 03/11/2017) ____________________________________________________________________________________________   BMI interpretation table: BMI level Category Range association with higher incidence of chronic pain  <18 kg/m2 Underweight   18.5-24.9 kg/m2 Ideal body weight   25-29.9 kg/m2 Overweight Increased incidence by 20%  30-34.9 kg/m2 Obese (Class I) Increased incidence by 68%   35-39.9 kg/m2 Severe obesity (Class II) Increased incidence by 136%  >40 kg/m2 Extreme obesity (Class III) Increased incidence by 254%   BMI Readings from Last 4 Encounters:  03/22/17 35.74 kg/m  02/17/17 39.72 kg/m  01/01/17 39.52 kg/m  12/29/16 35.44 kg/m   Wt Readings from Last 4 Encounters:  03/22/17 242 lb (109.8 kg)  02/17/17 269 lb (122 kg)  01/01/17 267 lb 9.6 oz (121.4 kg)  12/29/16 240 lb (108.9 kg)

## 2017-03-22 NOTE — Progress Notes (Signed)
Nursing Pain Medication Assessment:  Safety precautions to be maintained throughout the outpatient stay will include: orient to surroundings, keep bed in low position, maintain call bell within reach at all times, provide assistance with transfer out of bed and ambulation.  Medication Inspection Compliance: Pill count conducted under aseptic conditions, in front of the patient. Neither the pills nor the bottle was removed from the patient's sight at any time. Once count was completed pills were immediately returned to the patient in their original bottle.  Medication: Oxycodone IR Pill/Patch Count: 48 of 120 pills remain Pill/Patch Appearance: Markings consistent with prescribed medication Bottle Appearance: Standard pharmacy container. Clearly labeled. Filled Date: 02/22 2019 Last Medication intake:  Today

## 2017-03-22 NOTE — Telephone Encounter (Signed)
noted 

## 2017-03-22 NOTE — Progress Notes (Signed)
Patient's Name: Manuel Knight  MRN: [id number]  Referring Provider: [facility]  DOB: 1960-08-11  PCP: [facility]  DOS: 03/22/2017  Note by: Vevelyn Francois NP  Service setting: Ambulatory outpatient  Specialty: Interventional Pain Management  Location: [facility] (AMB) Pain Management Facility    Patient type: Established    Primary Reason(s) for Visit: Encounter for prescription drug management. (Level of risk: moderate)  CC: Back Pain (lower)  HPI  Manuel Knight is a 57 y.o. year old, male patient, who comes today for a medication management evaluation. He has Type 2 diabetes mellitus with hyperglycemia, with long-term current use of insulin ([place]); HLD (hyperlipidemia); Obesity, unspecified; Anxiety state; Essential hypertension; COPD with emphysema ([place]); PULMONARY NODULE; HERNIA; Abdominal pain; GERD (gastroesophageal reflux disease); Esophageal dysphagia; NSTEMI (non-ST elevated myocardial infarction) ([place]); CAD (coronary artery disease), native coronary artery; S/P CABG x 3; Chronic systolic heart failure ([place]); Emphysema of lung ([place]); Lumbar spondylosis with lumbar radiculitis (Left); H/O coronary artery bypass surgery; Encounter for pain management planning; Encounter for therapeutic drug level monitoring; Opiate use (90 MME/Day); Chronic low back pain (Location of Secondary source of pain) (Bilateral) (L>R); Chronic knee pain (Location of Primary Source of Pain) (Bilateral) (L>R); Chronic foot pain (Bilateral) (L>R); Disturbance of skin sensation; Diabetic peripheral neuropathy (HCC) (lower extremity); Chronic lumbar radicular pain (L5 dermatome) (Right); Chronic sacroiliac joint pain (Bilateral) (L>R); Osteoarthritis of knee (Bilateral) (L>R); Lumbar facet syndrome (Bilateral) (L>R); Lumbar facet arthropathy; Chronic lower extremity pain (Location of Tertiary source of pain) (Bilateral) (R>L); History of marijuana use; Hypomagnesemia; Vitamin D insufficiency;  Long term current use of opiate analgesic; Long term prescription opiate use; Chronic pain syndrome; SIRS (systemic inflammatory response syndrome) ([place]); Spondylosis of lumbar spine; and Encounter for screening colonoscopy on their problem list. His primarily concern today is the Back Pain (lower)  Pain Assessment: Location: Lower Back Radiating: both legs Onset: More than a month ago Duration: Chronic pain Quality: (steady) Severity: 2 /10 (self-reported pain score)  Note: Reported level is compatible with observation.                          Effect on ADL:   Timing: Constant Modifying factors: medications  Manuel Knight was last scheduled for an appointment on 12/29/2016 for medication management. During today's appointment we reviewed Manuel Knight's chronic pain status, as well as his outpatient medication regimen. He has numbness and tingling no weakness. He is asking for one additional Oxycodone per day.   The patient  reports that he does not use drugs. His body mass index is 35.74 kg/m.  Further details on both, my assessment(s), as well as the proposed treatment plan, please see below.  Controlled Substance Pharmacotherapy Assessment REMS (Risk Evaluation and Mitigation Strategy)  Analgesic:Oxycodone IR 15 mg every 6 hours (60 mg/day) MME/day:90 mg/day.   [name], RN  03/22/2017  1:03 PM  Sign at close encounter Nursing Pain Medication Assessment:  Safety precautions to be maintained throughout the outpatient stay will include: orient to surroundings, keep bed in low position, maintain call bell within reach at all times, provide assistance with transfer out of bed and ambulation.  Medication Inspection Compliance: Pill count conducted under aseptic conditions, in front of the patient. Neither the pills nor the bottle was removed from the patient's sight at any time. Once count was completed pills were immediately returned to the patient in their original  bottle.  Medication: Oxycodone IR  Pill/Patch Count: 48 of 120 pills remain Pill/Patch Appearance: Markings consistent with prescribed medication Bottle Appearance: Standard pharmacy container. Clearly labeled. Filled Date: 02/22 2019 Last Medication intake:  Today   Pharmacokinetics: Liberation and absorption (onset of action): WNL Distribution (time to peak effect): WNL Metabolism and excretion (duration of action): WNL         Pharmacodynamics: Desired effects: Analgesia: Manuel Knight reports >50% benefit. Functional ability: Patient reports that medication allows him to accomplish basic ADLs Clinically meaningful improvement in function (CMIF): Sustained CMIF goals met Perceived effectiveness: Described as relatively effective, allowing for increase in activities of daily living (ADL) Undesirable effects: Side-effects or Adverse reactions: None reported Monitoring: [place] PMP: Online review of the past 32-monthperiod conducted. Compliant with practice rules and regulations Last UDS on record: Summary  Date Value Ref Range Status  12/29/2016 FINAL  Final    Comment:    ==================================================================== TOXASSURE SELECT 13 (MW) ==================================================================== Test                             Result       Flag       Units Drug Present and Declared for Prescription Verification   Oxycodone                      2523         EXPECTED   ng/mg creat   Oxymorphone                    705          EXPECTED   ng/mg creat   Noroxycodone                   1379         EXPECTED   ng/mg creat   Noroxymorphone                 265          EXPECTED   ng/mg creat    Sources of oxycodone are scheduled prescription medications.    Oxymorphone, noroxycodone, and noroxymorphone are expected    metabolites of oxycodone. Oxymorphone is also available as a    scheduled prescription medication. Drug Present not Declared for  Prescription Verification   Alcohol, Ethyl                 0.121        UNEXPECTED g/dL    Sources of ethyl alcohol include alcoholic beverages or as a    fermentation product of glucose; glucose is present in this    specimen.  Interpret result with caution, as the presence of    ethyl alcohol is likely due, at least in part, to fermentation of    glucose.   7-aminoclonazepam              130          UNEXPECTED ng/mg creat    7-aminoclonazepam is an expected metabolite of clonazepam. Source    of clonazepam is a scheduled prescription medication. Drug Absent but Declared for Prescription Verification   Amphetamine                    Not Detected UNEXPECTED ng/mg creat   Alprazolam                     Not Detected UNEXPECTED ng/mg creat  Hydromorphone                  Not Detected UNEXPECTED ng/mg creat ==================================================================== Test                      Result    Flag   Units      Ref Range   Creatinine              43               mg/dL      >=20 ==================================================================== Declared Medications:  The flagging and interpretation on this report are based on the  following declared medications.  Unexpected results may arise from  inaccuracies in the declared medications.  **Note: The testing scope of this panel includes these medications:  Alprazolam  Amphetamine (Adderall)  Hydromorphone  Oxycodone  **Note: The testing scope of this panel does not include following  reported medications:  Albuterol  Amlodipine  Aspirin (Aspirin 81)  Atorvastatin  Benazepril  Canagliflozin  Carvedilol  Cholecalciferol  Citalopram  Clonidine  Duloxetine  Fluticasone  Fluticasone (Advair)  Gabapentin  Insulin  Magnesium  Meloxicam  Metformin  Naloxone  Nitroglycerin  Pantoprazole  Sacubitril (Entresto)  Salmeterol (Advair)  Sildenafil (Revatio)  Tiotropium (Spiriva)  Valsartan  (Entresto) ==================================================================== For clinical consultation, please call [phone]. ====================================================================    UDS interpretation: Compliant          Medication Assessment Form: Reviewed. Patient indicates being compliant with therapy Treatment compliance: Compliant Risk Assessment Profile: Aberrant behavior: See prior evaluations. None observed or detected today Comorbid factors increasing risk of overdose: See prior notes. No additional risks detected today Risk of substance use disorder (SUD): Low  ORT Scoring interpretation table:  Score <3 = Low Risk for SUD  Score between 4-7 = Moderate Risk for SUD  Score >8 = High Risk for Opioid Abuse   Risk Mitigation Strategies:  Patient Counseling: Covered Patient-Prescriber Agreement (PPA): Present and active  Notification to other healthcare providers: Done  Pharmacologic Plan: No change in therapy, at this time.             Laboratory Chemistry  Inflammation Markers (CRP: Acute Phase) (ESR: Chronic Phase) Lab Results  Component Value Date   CRP 5.8 (H) 06/04/2016   ESRSEDRATE 2 06/04/2016   LATICACIDVEN 1.3 03/12/2015                         Rheumatology Markers Lab Results  Component Value Date   LABURIC 5.9 02/04/2013                Renal Function Markers Lab Results  Component Value Date   BUN 17 02/17/2017   CREATININE 0.95 02/17/2017   GFRAA >60 02/17/2017   GFRNONAA >60 02/17/2017                 Hepatic Function Markers Lab Results  Component Value Date   AST 16 06/04/2016   ALT 26 06/04/2016   ALBUMIN 4.3 06/04/2016   ALKPHOS 63 06/04/2016   HCVAB NEGATIVE 11/22/2008   LIPASE 35 11/21/2008                 Electrolytes Lab Results  Component Value Date   NA 139 02/17/2017   K 4.8 02/17/2017   CL 103 02/17/2017   CALCIUM 9.5 02/17/2017   MG 1.7 06/04/2016   PHOS 3.2 03/12/2015  Neuropathy Markers Lab Results  Component Value Date   VITAMINB12 473 06/04/2016   HGBA1C 13.8 (H) 03/12/2015   HIV Non Reactive 03/15/2015                 Bone Pathology Markers Lab Results  Component Value Date   VD25OH 31 02/03/2010   25OHVITD1 31 06/04/2016   25OHVITD2 1.9 06/04/2016   25OHVITD3 29 06/04/2016                         Coagulation Parameters Lab Results  Component Value Date   INR 1.59 (H) 02/06/2013   LABPROT 18.5 (H) 02/06/2013   APTT 40 (H) 02/06/2013   PLT 267 02/17/2017   DDIMER 0.32 05/21/2011                 Cardiovascular Markers Lab Results  Component Value Date   CKTOTAL 63 05/22/2011   CKMB 2.1 05/22/2011   TROPONINI 3.91 (HH) 02/04/2013   HGB 14.9 02/17/2017   HCT 46.7 02/17/2017                 CA Markers No results found for: CEA, CA125, LABCA2               Note: Lab results reviewed.  Recent Diagnostic Imaging Results  DG C-Arm 1-60 Min-No Report Fluoroscopy was utilized by the requesting physician.  No radiographic  interpretation.   Complexity Note: Imaging results reviewed. Results shared with Mr. Mcisaac, using Layman's terms.                         Meds   Current Outpatient Medications:  .  albuterol (PROAIR HFA) 108 (90 Base) MCG/ACT inhaler, Inhale 1-2 puffs into the lungs every 6 (six) hours as needed (for shortness/wheezing.). , Disp: , Rfl:  .  albuterol (PROVENTIL) (2.5 MG/3ML) 0.083% nebulizer solution, Inhale 2.5 mg into the lungs 3 (three) times daily. , Disp: , Rfl:  .  alprazolam (XANAX) 2 MG tablet, Take 2 mg by mouth 3 (three) times daily. , Disp: , Rfl:  .  amLODipine (NORVASC) 10 MG tablet, Take 10 mg by mouth daily., Disp: , Rfl:  .  amphetamine-dextroamphetamine (ADDERALL) 30 MG tablet, Take 30 mg by mouth 3 (three) times daily. , Disp: , Rfl:  .  aspirin EC 81 MG tablet, Take 1 tablet (81 mg total) by mouth daily., Disp: , Rfl:  .  atorvastatin (LIPITOR) 80 MG tablet, Take 80 mg by mouth daily. ,  Disp: , Rfl:  .  Canagliflozin (INVOKANA) 100 MG TABS, Take 100 mg by mouth daily., Disp: , Rfl:  .  carvedilol (COREG) 12.5 MG tablet, Take 12.5 mg by mouth 2 (two) times daily., Disp: , Rfl:  .  Cholecalciferol (VITAMIN D3) 2000 units capsule, Take 2,000 Units by mouth daily., Disp: , Rfl: 1 .  citalopram (CELEXA) 20 MG tablet, Take 20 mg by mouth daily. , Disp: , Rfl:  .  cloNIDine (CATAPRES) 0.3 MG tablet, Take 0.3 mg by mouth 2 (two) times daily., Disp: , Rfl: 1 .  D3 SUPER STRENGTH 2000 units CAPS, Take 2,000 Units by mouth daily. , Disp: , Rfl: 99 .  fluticasone (FLONASE) 50 MCG/ACT nasal spray, Place 1-2 sprays into both nostrils 2 (two) times daily as needed (for allergies.). , Disp: , Rfl: 0 .  Fluticasone-Salmeterol (ADVAIR DISKUS) 500-50 MCG/DOSE AEPB, Inhale 1 puff into the lungs every 12 (twelve) hours.  ,  Disp: , Rfl:  .  gabapentin (NEURONTIN) 300 MG capsule, TAKE ONE CAPSULE BY MOUTH THREE TIMES DAILY. (TAKE WITH 600MG TABLET TO TOTAL 900MG), Disp: , Rfl: 1 .  gabapentin (NEURONTIN) 600 MG tablet, Take 600 mg by mouth 3 (three) times daily., Disp: , Rfl:  .  insulin aspart (NOVOLOG FLEXPEN) 100 UNIT/ML FlexPen, Inject 20 Units into the skin 3 (three) times daily. 20 units three times a day with sliding scale max of 26 units , Disp: , Rfl:  .  LEVEMIR FLEXTOUCH 100 UNIT/ML Pen, Inject 50 Units into the skin at bedtime., Disp: , Rfl: 1 .  Melatonin 10 MG TABS, Take 10 mg by mouth at bedtime., Disp: , Rfl:  .  meloxicam (MOBIC) 15 MG tablet, Take 15 mg by mouth 2 (two) times daily., Disp: , Rfl:  .  metFORMIN (GLUCOPHAGE) 1000 MG tablet, Take 1,000 mg by mouth 2 (two) times daily. , Disp: , Rfl:  .  naloxone (NARCAN) 2 MG/2ML injection, Inject content of syringe into thigh muscle. Call 911., Disp: 2 Syringe, Rfl: 1 .  naloxone HCl 4 MG/0.1ML LIQD, One spray in either nostril once for known/suspected opioid overdose. May repeat every 2-3 minutes in alternating nostril til EMS arrives,  Disp: , Rfl:  .  nitroGLYCERIN (NITROSTAT) 0.4 MG SL tablet, Place 1 tablet (0.4 mg total) under the tongue every 5 (five) minutes as needed for chest pain., Disp: 90 tablet, Rfl: 3 .  [START ON 05/04/2017] oxyCODONE (ROXICODONE) 15 MG immediate release tablet, Take 1 tablet (15 mg total) by mouth every 6 (six) hours as needed for pain., Disp: 120 tablet, Rfl: 0 .  pantoprazole (PROTONIX) 40 MG tablet, Take 40 mg by mouth 2 (two) times daily., Disp: , Rfl:  .  sacubitril-valsartan (ENTRESTO) 97-103 MG, Take 1 tablet by mouth 2 (two) times daily., Disp: 60 tablet, Rfl: 1 .  tiotropium (SPIRIVA) 18 MCG inhalation capsule, Place 18 mcg into inhaler and inhale daily.  , Disp: , Rfl:  .  Magnesium Oxide 500 MG CAPS, Take 1 capsule (500 mg total) by mouth 2 (two) times daily at 8 am and 10 pm. (Patient not taking: Reported on 02/08/2017), Disp: 100 capsule, Rfl: PRN .  [START ON 06/03/2017] oxyCODONE (ROXICODONE) 15 MG immediate release tablet, Take 1 tablet (15 mg total) by mouth every 6 (six) hours as needed., Disp: 120 tablet, Rfl: 0 .  [START ON 04/04/2017] oxyCODONE (ROXICODONE) 15 MG immediate release tablet, Take 1 tablet (15 mg total) by mouth every 6 (six) hours as needed for pain., Disp: 120 tablet, Rfl: 0  ROS  Constitutional: Denies any fever or chills Gastrointestinal: No reported hemesis, hematochezia, vomiting, or acute GI distress Musculoskeletal: Denies any acute onset joint swelling, redness, loss of ROM, or weakness Neurological: No reported episodes of acute onset apraxia, aphasia, dysarthria, agnosia, amnesia, paralysis, loss of coordination, or loss of consciousness  Allergies  Mr. Neyman is allergic to lodine [etodolac]; tylenol [acetaminophen]; iodine; and propoxyphene n-acetaminophen.  [place]  Drug: Mr. Ripberger  reports that he does not use drugs. Alcohol:  reports that he does not drink alcohol. Tobacco:  reports that he quit smoking about 28 years ago. His smoking use included  cigarettes. He started smoking about 50 years ago. He has a 28.50 pack-year smoking history. he has never used smokeless tobacco. Medical:  has a past medical history of Anxiety, Arthritis, Cellulitis (2011), Chronic back pain, Claustrophobia, COPD (chronic obstructive pulmonary disease) ([place]), Diabetes mellitus, type 2 ([place]), Diverticulitis  of colon (2008), GERD (gastroesophageal reflux disease), Heart attack ([place]) (2017), History of kidney stones, Hyperlipidemia, Hypertension, Obesity, and Pulmonary nodule. Surgical: Mr. Allman  has a past surgical history that includes Appendectomy (2007); Tonsillectomy; Coronary artery bypass graft (N/A, 02/06/2013); and left heart catheterization with coronary angiogram (N/A, 02/03/2013). Family: family history includes Cancer in his mother; Coronary artery disease in his mother and sister; Diabetes in his sister; Heart attack in his brother and father.  Constitutional Exam  General appearance: Well nourished, well developed, and well hydrated. In no apparent acute distress Vitals:   03/22/17 1241  BP: (!) 141/69  Pulse: 74  Resp: 18  Temp: 98.1 F (36.7 C)  TempSrc: Oral  SpO2: 98%  Weight: 242 lb (109.8 kg)  Height: 5' 9" (1.753 m)   BMI Assessment: Estimated body mass index is 35.74 kg/m as calculated from the following:   Height as of this encounter: 5' 9" (1.753 m).   Weight as of this encounter: 242 lb (109.8 kg). Psych/Mental status: Alert, oriented x 3 (person, place, & time)       Eyes: PERLA Respiratory: No evidence of acute respiratory distress  Cervical Spine Area Exam  Skin & Axial Inspection: No masses, redness, edema, swelling, or associated skin lesions Alignment: Symmetrical Functional ROM: Unrestricted ROM      Stability: No instability detected Muscle Tone/Strength: Functionally intact. No obvious neuro-muscular anomalies detected. Sensory (Neurological): Unimpaired Palpation: No palpable anomalies              Upper  Extremity (UE) Exam    Side: Right upper extremity  Side: Left upper extremity  Skin & Extremity Inspection: Skin color, temperature, and hair growth are WNL. No peripheral edema or cyanosis. No masses, redness, swelling, asymmetry, or associated skin lesions. No contractures.  Skin & Extremity Inspection: Skin color, temperature, and hair growth are WNL. No peripheral edema or cyanosis. No masses, redness, swelling, asymmetry, or associated skin lesions. No contractures.  Functional ROM: Unrestricted ROM          Functional ROM: Unrestricted ROM          Muscle Tone/Strength: Functionally intact. No obvious neuro-muscular anomalies detected.  Muscle Tone/Strength: Functionally intact. No obvious neuro-muscular anomalies detected.  Sensory (Neurological): Unimpaired          Sensory (Neurological): Unimpaired          Palpation: No palpable anomalies              Palpation: No palpable anomalies              Specialized Test(s): Deferred         Specialized Test(s): Deferred          Thoracic Spine Area Exam  Skin & Axial Inspection: No masses, redness, or swelling Alignment: Symmetrical Functional ROM: Unrestricted ROM Stability: No instability detected Muscle Tone/Strength: Functionally intact. No obvious neuro-muscular anomalies detected. Sensory (Neurological): Unimpaired Muscle strength & Tone: No palpable anomalies  Lumbar Spine Area Exam  Skin & Axial Inspection: No masses, redness, or swelling Alignment: Symmetrical Functional ROM: Unrestricted ROM      Stability: No instability detected Muscle Tone/Strength: Functionally intact. No obvious neuro-muscular anomalies detected. Sensory (Neurological): Unimpaired Palpation: Complains of area being tender to palpation       Provocative Tests: Lumbar Hyperextension and rotation test: evaluation deferred today       Lumbar Lateral bending test: evaluation deferred today       Patrick's Maneuver: evaluation deferred today  Gait & Posture Assessment  Ambulation: Unassisted Gait: Relatively normal for age and body habitus Posture: WNL   Lower Extremity Exam    Side: Right lower extremity  Side: Left lower extremity  Skin & Extremity Inspection: Skin color, temperature, and hair growth are WNL. No peripheral edema or cyanosis. No masses, redness, swelling, asymmetry, or associated skin lesions. No contractures.  Skin & Extremity Inspection: Skin color, temperature, and hair growth are WNL. No peripheral edema or cyanosis. No masses, redness, swelling, asymmetry, or associated skin lesions. No contractures.  Functional ROM: Unrestricted ROM          Functional ROM: Unrestricted ROM          Muscle Tone/Strength: Functionally intact. No obvious neuro-muscular anomalies detected.  Muscle Tone/Strength: Functionally intact. No obvious neuro-muscular anomalies detected.  Sensory (Neurological): Unimpaired  Sensory (Neurological): Unimpaired  Palpation: No palpable anomalies  Palpation: No palpable anomalies   Assessment  Primary Diagnosis & Pertinent Problem List: The primary encounter diagnosis was Lumbar spondylosis with lumbar radiculitis (Left). Diagnoses of Lumbar facet syndrome (Bilateral) (L>R), Chronic sacroiliac joint pain (Bilateral) (L>R), and Chronic pain syndrome were also pertinent to this visit.  Status Diagnosis  Controlled Controlled Controlled 1. Lumbar spondylosis with lumbar radiculitis (Left)   2. Lumbar facet syndrome (Bilateral) (L>R)   3. Chronic sacroiliac joint pain (Bilateral) (L>R)   4. Chronic pain syndrome     Problems updated and reviewed during this visit: No problems updated. Plan of Care  Pharmacotherapy (Medications Ordered): Meds ordered this encounter  Medications  . oxyCODONE (ROXICODONE) 15 MG immediate release tablet    Sig: Take 1 tablet (15 mg total) by mouth every 6 (six) hours as needed.    Dispense:  120 tablet    Refill:  0    Do not add this medication to  the electronic "Automatic Refill" notification system. Patient may have prescription filled one day early if pharmacy is closed on scheduled refill date. Do not fill until: 06/03/2017 To last until:07/03/2017    Order Specific Question:   Supervising Provider    Answer:   Milinda Pointer [phone]  . oxyCODONE (ROXICODONE) 15 MG immediate release tablet    Sig: Take 1 tablet (15 mg total) by mouth every 6 (six) hours as needed for pain.    Dispense:  120 tablet    Refill:  0    Do not add this medication to the electronic "Automatic Refill" notification system. Patient may have prescription filled one day early if pharmacy is closed on scheduled refill date. Do not fill until:05/04/2017 To last until: 06/03/2017    Order Specific Question:   Supervising Provider    Answer:   Milinda Pointer [phone]  . oxyCODONE (ROXICODONE) 15 MG immediate release tablet    Sig: Take 1 tablet (15 mg total) by mouth every 6 (six) hours as needed for pain.    Dispense:  120 tablet    Refill:  0    Do not add this medication to the electronic "Automatic Refill" notification system. Patient may have prescription filled one day early if pharmacy is closed on scheduled refill date. Do not fill until: 04/04/2017 To last until:05/04/2017    Order Specific Question:   Supervising Provider    Answer:   Milinda Pointer [phone]   New Prescriptions   No medications on file   Medications administered today: Mamadou Breon had no medications administered during this visit. Lab-work, procedure(s), and/or referral(s): No orders of the defined types were placed in  this encounter.  Imaging and/or referral(s): None  Interventional therapies: Planned, scheduled, and/or pending:  Not at this time. Informed him that he was on a current MME that was higher than recommended and his medication actually needs to be reduced .    Considering:  Diagnostic bilateralintra-articular knee injection Diagnostic  bilateral genicular nerve block Possible bilateral genicular nerve radiofrequency ablation Diagnostic bilateral lumbar facet block Possible bilaterallumbar facet radiofrequency ablation(Left side done on 03/30/2016) Diagnostic left-sided L4-5 lumbar epidural steroid injection Diagnostic bilateral sacroiliac joint block Possible bilateral sacroiliac joint radiofrequency ablation   Palliative PRN treatment(s):  Palliativebilateral intra-articular knee injection Palliativebilateral genicular nerveblock Palliativebilateral lumbar facetblock Palliativeleft-sided L4-5 lumbar epidural steroid injection Palliativebilateral sacroiliac jointblock  Provider-requested follow-up: Return in about 3 months (around 06/23/2017) for MedMgmt with Me Donella Stade Edison Pace).  Future Appointments  Date Time Provider [place]  05/14/2017 10:30 AM West[facility] Pam [facility]   Primary Care Physician: [facility] Location: [facility] Out[facility] Management Facility Note by: Vevelyn Francois NP Date: 03/22/2017; Time: 1:05 PM  Pain Score Disclaimer: We use the NRS-11 scale. This is a self-reported, subjective measurement of pain severity with only modest accuracy. It is used primarily to identify changes within a particular patient. It must be understood that outpatient pain scales are significantly less accurate that those used for research, where they can be applied under ideal controlled circumstances with minimal exposure to variables. In reality, the score is likely to be a combination of pain intensity and pain affect, where pain affect describes the degree of emotional arousal or changes in action readiness caused by the sensory experience of pain. Factors such as social and work situation, setting, emotional state, anxiety levels, expectation, and prior pain experience may influence pain perception and show large inter-individual differences that may also be affected  by time variables.  Patient instructions provided during this appointment: Patient Instructions   ____________________________________________________________________________________________  Medication Rules  Applies to: All patients receiving prescriptions (written or electronic).  Pharmacy of record: Pharmacy where electronic prescriptions will be sent. If written prescriptions are taken to a different pharmacy, please inform the nursing staff. The pharmacy listed in the electronic medical record should be the one where you would like electronic prescriptions to be sent.  Prescription refills: Only during scheduled appointments. Applies to both, written and electronic prescriptions.  NOTE: The following applies primarily to controlled substances (Opioid* Pain Medications).   Patient's responsibilities: 1. Pain Pills: Bring all pain pills to every appointment (except for procedure appointments). 2. Pill Bottles: Bring pills in original pharmacy bottle. Always bring newest bottle. Bring bottle, even if empty. 3. Medication refills: You are responsible for knowing and keeping track of what medications you need refilled. The day before your appointment, write a list of all prescriptions that need to be refilled. Bring that list to your appointment and give it to the admitting nurse. Prescriptions will be written only during appointments. If you forget a medication, it will not be "Called in", "Faxed", or "electronically sent". You will need to get another appointment to get these prescribed. 4. Prescription Accuracy: You are responsible for carefully inspecting your prescriptions before leaving our office. Have the discharge nurse carefully go over each prescription with you, before taking them home. Make sure that your name is accurately spelled, that your address is correct. Check the name and dose of your medication to make sure it is accurate. Check the number of pills, and the written  instructions to make  sure they are clear and accurate. Make sure that you are given enough medication to last until your next medication refill appointment. 5. Taking Medication: Take medication as prescribed. Never take more pills than instructed. Never take medication more frequently than prescribed. Taking less pills or less frequently is permitted and encouraged, when it comes to controlled substances (written prescriptions).  6. Inform other Doctors: Always inform, all of your healthcare providers, of all the medications you take. 7. Pain Medication from other Providers: You are not allowed to accept any additional pain medication from any other Doctor or Healthcare provider. There are two exceptions to this rule. (see below) In the event that you require additional pain medication, you are responsible for notifying us, as stated below. 8. Medication Agreement: You are responsible for carefully reading and following our Medication Agreement. This must be signed before receiving any prescriptions from our practice. Safely store a copy of your signed Agreement. Violations to the Agreement will result in no further prescriptions. (Additional copies of our Medication Agreement are available upon request.) 9. Laws, Rules, & Regulations: All patients are expected to follow all Federal and [employer], [employer], Rules, [employer]. Ignorance of the Laws does not constitute a valid excuse. The use of any illegal substances is prohibited. 10. Adopted CDC guidelines & recommendations: Target dosing levels will be at or below 60 MME/day. Use of benzodiazepines** is not recommended.  Exceptions: There are only two exceptions to the rule of not receiving pain medications from other Healthcare Providers. 1. Exception #1 (Emergencies): In the event of an emergency (i.e.: accident requiring emergency care), you are allowed to receive additional pain medication. However, you are responsible for: As soon as you are  able, call our office [phone], at any time of the day or night, and leave a message stating your name, the date and nature of the emergency, and the name and dose of the medication prescribed. In the event that your call is answered by a member of our staff, make sure to document and save the date, time, and the name of the person that took your information.  2. Exception #2 (Planned Surgery): In the event that you are scheduled by another doctor or dentist to have any type of surgery or procedure, you are allowed (for a period no longer than 30 days), to receive additional pain medication, for the acute post-op pain. However, in this case, you are responsible for picking up a copy of our "Post-op Pain Management for Surgeons" handout, and giving it to your surgeon or dentist. This document is available at our office, and does not require an appointment to obtain it. Simply go to our office during business hours (Monday-Thursday from 8:00 AM to 4:00 PM) (Friday 8:00 AM to 12:00 Noon) or if you have a scheduled appointment with [place], prior to your surgery, and ask for it by name. In addition, you will need to provide [place] with your name, name of your surgeon, type of surgery, and date of procedure or surgery.  *Opioid medications include: morphine, codeine, oxycodone, oxymorphone, hydrocodone, hydromorphone, meperidine, tramadol, tapentadol, buprenorphine, fentanyl, methadone. **Benzodiazepine medications include: diazepam (Valium), alprazolam (Xanax), clonazepam (Klonopine), lorazepam (Ativan), clorazepate (Tranxene), chlordiazepoxide (Librium), estazolam (Prosom), oxazepam (Serax), temazepam (Restoril), triazolam (Halcion) (Last updated: 03/11/2017) ____________________________________________________________________________________________   BMI interpretation table: BMI level Category Range association with higher incidence of chronic pain  <18 kg/m2 Underweight   18.5-24.9 kg/m2 Ideal body weight    25-29.9 kg/m2 Overweight Increased incidence by 20%  30-34.9 kg/m2 Obese (Class I) Increased incidence by 68%  35-39.9 kg/m2 Severe obesity (Class II) Increased incidence by 136%  >40 kg/m2 Extreme obesity (Class III) Increased incidence by 254%   BMI Readings from Last 4 Encounters:  03/22/17 35.74 kg/m  02/17/17 39.72 kg/m  01/01/17 39.52 kg/m  12/29/16 35.44 kg/m   Wt Readings from Last 4 Encounters:  03/22/17 242 lb (109.8 kg)  02/17/17 269 lb (122 kg)  01/01/17 267 lb 9.6 oz (121.4 kg)  12/29/16 240 lb (108.9 kg)

## 2017-03-23 ENCOUNTER — Ambulatory Visit (HOSPITAL_COMMUNITY): Admission: RE | Admit: 2017-03-23 | Payer: Medicare Other | Source: Ambulatory Visit | Admitting: Gastroenterology

## 2017-03-23 ENCOUNTER — Encounter (HOSPITAL_COMMUNITY): Admission: RE | Payer: Self-pay | Source: Ambulatory Visit

## 2017-03-23 SURGERY — COLONOSCOPY WITH PROPOFOL
Anesthesia: Monitor Anesthesia Care

## 2017-03-25 ENCOUNTER — Encounter: Payer: Medicare Other | Admitting: Nurse Practitioner

## 2017-05-14 ENCOUNTER — Encounter: Payer: Self-pay | Admitting: Gastroenterology

## 2017-05-14 ENCOUNTER — Ambulatory Visit: Payer: Medicare Other | Admitting: Gastroenterology

## 2017-06-23 ENCOUNTER — Ambulatory Visit: Payer: Medicare Other | Attending: Nurse Practitioner | Admitting: Nurse Practitioner

## 2017-06-23 ENCOUNTER — Other Ambulatory Visit: Payer: Self-pay

## 2017-06-23 ENCOUNTER — Encounter: Payer: Self-pay | Admitting: Nurse Practitioner

## 2017-06-23 VITALS — BP 187/94 | HR 88 | Temp 97.7°F | Resp 16 | Ht 69.5 in | Wt 240.0 lb

## 2017-06-23 DIAGNOSIS — G8929 Other chronic pain: Secondary | ICD-10-CM | POA: Diagnosis not present

## 2017-06-23 DIAGNOSIS — I214 Non-ST elevation (NSTEMI) myocardial infarction: Secondary | ICD-10-CM | POA: Diagnosis not present

## 2017-06-23 DIAGNOSIS — M47816 Spondylosis without myelopathy or radiculopathy, lumbar region: Secondary | ICD-10-CM | POA: Diagnosis not present

## 2017-06-23 DIAGNOSIS — M545 Low back pain: Secondary | ICD-10-CM | POA: Diagnosis not present

## 2017-06-23 DIAGNOSIS — M170 Bilateral primary osteoarthritis of knee: Secondary | ICD-10-CM

## 2017-06-23 DIAGNOSIS — G894 Chronic pain syndrome: Secondary | ICD-10-CM | POA: Diagnosis not present

## 2017-06-23 DIAGNOSIS — J449 Chronic obstructive pulmonary disease, unspecified: Secondary | ICD-10-CM | POA: Insufficient documentation

## 2017-06-23 DIAGNOSIS — M25562 Pain in left knee: Secondary | ICD-10-CM | POA: Diagnosis not present

## 2017-06-23 DIAGNOSIS — I110 Hypertensive heart disease with heart failure: Secondary | ICD-10-CM | POA: Diagnosis not present

## 2017-06-23 DIAGNOSIS — I2510 Atherosclerotic heart disease of native coronary artery without angina pectoris: Secondary | ICD-10-CM | POA: Diagnosis not present

## 2017-06-23 DIAGNOSIS — Z5181 Encounter for therapeutic drug level monitoring: Secondary | ICD-10-CM | POA: Insufficient documentation

## 2017-06-23 DIAGNOSIS — Z951 Presence of aortocoronary bypass graft: Secondary | ICD-10-CM | POA: Insufficient documentation

## 2017-06-23 DIAGNOSIS — E785 Hyperlipidemia, unspecified: Secondary | ICD-10-CM | POA: Insufficient documentation

## 2017-06-23 DIAGNOSIS — Z87891 Personal history of nicotine dependence: Secondary | ICD-10-CM | POA: Insufficient documentation

## 2017-06-23 DIAGNOSIS — M533 Sacrococcygeal disorders, not elsewhere classified: Secondary | ICD-10-CM

## 2017-06-23 DIAGNOSIS — M4726 Other spondylosis with radiculopathy, lumbar region: Secondary | ICD-10-CM | POA: Diagnosis not present

## 2017-06-23 DIAGNOSIS — Z79891 Long term (current) use of opiate analgesic: Secondary | ICD-10-CM

## 2017-06-23 DIAGNOSIS — I5022 Chronic systolic (congestive) heart failure: Secondary | ICD-10-CM | POA: Diagnosis not present

## 2017-06-23 DIAGNOSIS — E1165 Type 2 diabetes mellitus with hyperglycemia: Secondary | ICD-10-CM | POA: Insufficient documentation

## 2017-06-23 DIAGNOSIS — Z9889 Other specified postprocedural states: Secondary | ICD-10-CM | POA: Diagnosis not present

## 2017-06-23 DIAGNOSIS — Z7984 Long term (current) use of oral hypoglycemic drugs: Secondary | ICD-10-CM | POA: Insufficient documentation

## 2017-06-23 DIAGNOSIS — Z79899 Other long term (current) drug therapy: Secondary | ICD-10-CM | POA: Diagnosis not present

## 2017-06-23 DIAGNOSIS — K219 Gastro-esophageal reflux disease without esophagitis: Secondary | ICD-10-CM | POA: Diagnosis not present

## 2017-06-23 MED ORDER — OXYCODONE HCL 15 MG PO TABS: 15.0000 mg | ORAL_TABLET | Freq: Four times a day (QID) | ORAL | 0 refills | Status: DC | PRN

## 2017-06-23 MED ORDER — MAGNESIUM OXIDE -MG SUPPLEMENT 500 MG PO CAPS: 1.0000 | ORAL_CAPSULE | Freq: Two times a day (BID) | ORAL | 99 refills | Status: DC

## 2017-06-23 MED ORDER — NALOXONE HCL 4 MG/0.1ML NA LIQD: NASAL | 0 refills | Status: DC

## 2017-06-23 NOTE — Progress Notes (Signed)
Patient's Name: Manuel Knight  MRN: [id number]  Referring Provider: [facility]  DOB: Sep 09, 1960  PCP: [facility]  DOS: 06/23/2017  Note by: Vevelyn Francois NP  Service setting: Ambulatory outpatient  Specialty: Interventional Pain Management  Location: [facility] (AMB) Pain Management Facility    Patient type: Established    Primary Reason(s) for Visit: Encounter for prescription drug management. (Level of risk: moderate)  CC: Back Pain (lower) and Knee Pain (left)  HPI  Manuel Knight is a 57 y.o. year old, male patient, who comes today for a medication management evaluation. He has Type 2 diabetes mellitus with hyperglycemia, with long-term current use of insulin ([place]); HLD (hyperlipidemia); Obesity, unspecified; Anxiety state; Essential hypertension; COPD with emphysema ([place]); PULMONARY NODULE; HERNIA; Abdominal pain; GERD (gastroesophageal reflux disease); Esophageal dysphagia; NSTEMI (non-ST elevated myocardial infarction) ([place]); CAD (coronary artery disease), native coronary artery; S/P CABG x 3; Chronic systolic heart failure ([place]); Emphysema of lung ([place]); Lumbar spondylosis with lumbar radiculitis (Left); H/O coronary artery bypass surgery; Encounter for pain management planning; Encounter for therapeutic drug level monitoring; Opiate use (90 MME/Day); Chronic low back pain (Location of Secondary source of pain) (Bilateral) (L>R); Chronic knee pain (Location of Primary Source of Pain) (Bilateral) (L>R); Chronic foot pain (Bilateral) (L>R); Disturbance of skin sensation; Diabetic peripheral neuropathy (HCC) (lower extremity); Chronic lumbar radicular pain (L5 dermatome) (Right); Chronic sacroiliac joint pain (Bilateral) (L>R); Osteoarthritis of knee (Bilateral) (L>R); Lumbar facet syndrome (Bilateral) (L>R); Lumbar facet arthropathy; Chronic lower extremity pain (Location of Tertiary source of pain) (Bilateral) (R>L); History of marijuana use; Hypomagnesemia;  Vitamin D insufficiency; Long term current use of opiate analgesic; Long term prescription opiate use; Chronic pain syndrome; SIRS (systemic inflammatory response syndrome) ([place]); Spondylosis of lumbar spine; and Encounter for screening colonoscopy on their problem list. His primarily concern today is the Back Pain (lower) and Knee Pain (left)  Pain Assessment: Location: Lower Back Radiating: down left leg to big toe Onset: More than a month ago Duration: Chronic pain Quality: Throbbing, Constant Severity: 2 /10 (subjective, self-reported pain score)  Note: Reported level is compatible with observation.                          Effect on ADL:   Timing: Constant Modifying factors: medications BP: (!) 187/94  HR: 88  Manuel Knight was last scheduled for an appointment on 03/22/2017 for medication management. During today's appointment we reviewed Manuel Knight's chronic pain status, as well as his outpatient medication regimen. He admits that he is trying to do a little walking. He states that he feels like this is helping his back pain. He denies any concerns today. He denies any side effects of his medication.   The patient  reports that he does not use drugs. His body mass index is 34.93 kg/m.  Further details on both, my assessment(s), as well as the proposed treatment plan, please see below.  Controlled Substance Pharmacotherapy Assessment REMS (Risk Evaluation and Mitigation Strategy)  Analgesic:Oxycodone IR 15 mg every 6 hours (60 mg/day) MME/day:90 mg/day.   Rise Patience, RN  06/23/2017  9:08 AM  Sign at close encounter Nursing Pain Medication Assessment:  Safety precautions to be maintained throughout the outpatient stay will include: orient to surroundings, keep bed in low position, maintain call bell within reach at all times, provide assistance with transfer out of bed and ambulation.  Medication Inspection Compliance: Pill count conducted under aseptic conditions, in  front of  the patient. Neither the pills nor the bottle was removed from the patient's sight at any time. Once count was completed pills were immediately returned to the patient in their original bottle.  Medication: Oxycodone IR Pill/Patch Count: 39 of 120 pills remain Pill/Patch Appearance: Markings consistent with prescribed medication Bottle Appearance: Standard pharmacy container. Clearly labeled. Filled Date: 5 / 23 / 2019 Last Medication intake:  Today   Pharmacokinetics: Liberation and absorption (onset of action): WNL Distribution (time to peak effect): WNL Metabolism and excretion (duration of action): WNL         Pharmacodynamics: Desired effects: Analgesia: Manuel Knight reports >50% benefit. Functional ability: Patient reports that medication allows him to accomplish basic ADLs Clinically meaningful improvement in function (CMIF): Sustained CMIF goals met Perceived effectiveness: Described as relatively effective, allowing for increase in activities of daily living (ADL) Undesirable effects: Side-effects or Adverse reactions: None reported Monitoring: [place] PMP: Online review of the past 28-monthperiod conducted. Compliant with practice rules and regulations Last UDS on record: Summary  Date Value Ref Range Status  12/29/2016 FINAL  Final    Comment:    ==================================================================== TOXASSURE SELECT 13 (MW) ==================================================================== Test                             Result       Flag       Units Drug Present and Declared for Prescription Verification   Oxycodone                      2523         EXPECTED   ng/mg creat   Oxymorphone                    705          EXPECTED   ng/mg creat   Noroxycodone                   1379         EXPECTED   ng/mg creat   Noroxymorphone                 265          EXPECTED   ng/mg creat    Sources of oxycodone are scheduled prescription medications.    Oxymorphone,  noroxycodone, and noroxymorphone are expected    metabolites of oxycodone. Oxymorphone is also available as a    scheduled prescription medication. Drug Present not Declared for Prescription Verification   Alcohol, Ethyl                 0.121        UNEXPECTED g/dL    Sources of ethyl alcohol include alcoholic beverages or as a    fermentation product of glucose; glucose is present in this    specimen.  Interpret result with caution, as the presence of    ethyl alcohol is likely due, at least in part, to fermentation of    glucose.   7-aminoclonazepam              130          UNEXPECTED ng/mg creat    7-aminoclonazepam is an expected metabolite of clonazepam. Source    of clonazepam is a scheduled prescription medication. Drug Absent but Declared for Prescription Verification   Amphetamine  Not Detected UNEXPECTED ng/mg creat   Alprazolam                     Not Detected UNEXPECTED ng/mg creat   Hydromorphone                  Not Detected UNEXPECTED ng/mg creat ==================================================================== Test                      Result    Flag   Units      Ref Range   Creatinine              43               mg/dL      >=20 ==================================================================== Declared Medications:  The flagging and interpretation on this report are based on the  following declared medications.  Unexpected results may arise from  inaccuracies in the declared medications.  **Note: The testing scope of this panel includes these medications:  Alprazolam  Amphetamine (Adderall)  Hydromorphone  Oxycodone  **Note: The testing scope of this panel does not include following  reported medications:  Albuterol  Amlodipine  Aspirin (Aspirin 81)  Atorvastatin  Benazepril  Canagliflozin  Carvedilol  Cholecalciferol  Citalopram  Clonidine  Duloxetine  Fluticasone  Fluticasone (Advair)  Gabapentin  Insulin  Magnesium   Meloxicam  Metformin  Naloxone  Nitroglycerin  Pantoprazole  Sacubitril (Entresto)  Salmeterol (Advair)  Sildenafil (Revatio)  Tiotropium (Spiriva)  Valsartan (Entresto) ==================================================================== For clinical consultation, please call [phone]. ====================================================================    UDS interpretation: Compliant          Medication Assessment Form: Reviewed. Patient indicates being compliant with therapy Treatment compliance: Compliant Risk Assessment Profile: Aberrant behavior: See prior evaluations. None observed or detected today Comorbid factors increasing risk of overdose: See prior notes. No additional risks detected today Risk of substance use disorder (SUD): Low Opioid Risk Tool - 06/23/17 0908      Family History of Substance Abuse   Alcohol  Negative    Illegal Drugs  Negative    Rx Drugs  Negative      Personal History of Substance Abuse   Alcohol  Negative    Illegal Drugs  Negative    Rx Drugs  Negative      Age   Age between 76-45 years   No      History of Preadolescent Sexual Abuse   History of Preadolescent Sexual Abuse  Negative or Male      Psychological Disease   Psychological Disease  Negative    Depression  Negative      Total Score   Opioid Risk Tool Scoring  0    Opioid Risk Interpretation  Low Risk      ORT Scoring interpretation table:  Score <3 = Low Risk for SUD  Score between 4-7 = Moderate Risk for SUD  Score >8 = High Risk for Opioid Abuse   Risk Mitigation Strategies:  Patient Counseling: Covered Patient-Prescriber Agreement (PPA): Present and active  Notification to other healthcare providers: Done  Pharmacologic Plan: No change in therapy, at this time.             Laboratory Chemistry  Inflammation Markers (CRP: Acute Phase) (ESR: Chronic Phase) Lab Results  Component Value Date   CRP 5.8 (H) 06/04/2016   ESRSEDRATE 2 06/04/2016    LATICACIDVEN 1.3 03/12/2015  Rheumatology Markers Lab Results  Component Value Date   LABURIC 5.9 02/04/2013                        Renal Function Markers Lab Results  Component Value Date   BUN 17 02/17/2017   CREATININE 0.95 02/17/2017   BCR 19 06/04/2016   GFRAA >60 02/17/2017   GFRNONAA >60 02/17/2017                             Hepatic Function Markers Lab Results  Component Value Date   AST 16 06/04/2016   ALT 26 06/04/2016   ALBUMIN 4.3 06/04/2016   ALKPHOS 63 06/04/2016   HCVAB NEGATIVE 11/22/2008   LIPASE 35 11/21/2008                        Electrolytes Lab Results  Component Value Date   NA 139 02/17/2017   K 4.8 02/17/2017   CL 103 02/17/2017   CALCIUM 9.5 02/17/2017   MG 1.7 06/04/2016   PHOS 3.2 03/12/2015                        Neuropathy Markers Lab Results  Component Value Date   VITAMINB12 473 06/04/2016   HGBA1C 13.8 (H) 03/12/2015   HIV Non Reactive 03/15/2015                        Bone Pathology Markers Lab Results  Component Value Date   VD25OH 31 02/03/2010   25OHVITD1 31 06/04/2016   25OHVITD2 1.9 06/04/2016   25OHVITD3 29 06/04/2016                         Coagulation Parameters Lab Results  Component Value Date   INR 1.59 (H) 02/06/2013   LABPROT 18.5 (H) 02/06/2013   APTT 40 (H) 02/06/2013   PLT 267 02/17/2017   DDIMER 0.32 05/21/2011                        Cardiovascular Markers Lab Results  Component Value Date   CKTOTAL 63 05/22/2011   CKMB 2.1 05/22/2011   TROPONINI 3.91 (HH) 02/04/2013   HGB 14.9 02/17/2017   HCT 46.7 02/17/2017                         CA Markers No results found for: CEA, CA125, LABCA2                      Note: Lab results reviewed.  Recent Diagnostic Imaging Results  DG C-Arm 1-60 Min-No Report Fluoroscopy was utilized by the requesting physician.  No radiographic  interpretation.   Complexity Note: Imaging results reviewed. Results shared with Mr.  Pringle, using Layman's terms.                         Meds   Current Outpatient Medications:  .  albuterol (PROAIR HFA) 108 (90 Base) MCG/ACT inhaler, Inhale 1-2 puffs into the lungs every 6 (six) hours as needed (for shortness/wheezing.). , Disp: , Rfl:  .  albuterol (PROVENTIL) (2.5 MG/3ML) 0.083% nebulizer solution, Inhale 2.5 mg into the lungs 3 (three) times daily. , Disp: , Rfl:  .  alprazolam [name]) 2  MG tablet, Take 2 mg by mouth 3 (three) times daily. , Disp: , Rfl:  .  amLODipine (NORVASC) 10 MG tablet, Take 10 mg by mouth daily., Disp: , Rfl:  .  amphetamine-dextroamphetamine (ADDERALL) 30 MG tablet, Take 30 mg by mouth 3 (three) times daily. , Disp: , Rfl:  .  aspirin EC 81 MG tablet, Take 1 tablet (81 mg total) by mouth daily., Disp: , Rfl:  .  atorvastatin (LIPITOR) 80 MG tablet, Take 80 mg by mouth daily. , Disp: , Rfl:  .  Canagliflozin (INVOKANA) 100 MG TABS, Take 100 mg by mouth daily., Disp: , Rfl:  .  carvedilol (COREG) 12.5 MG tablet, Take 12.5 mg by mouth 2 (two) times daily., Disp: , Rfl:  .  Cholecalciferol (VITAMIN D3) 2000 units capsule, Take 2,000 Units by mouth daily., Disp: , Rfl: 1 .  citalopram (CELEXA) 20 MG tablet, Take 20 mg by mouth daily. , Disp: , Rfl:  .  cloNIDine (CATAPRES) 0.3 MG tablet, Take 0.3 mg by mouth 2 (two) times daily., Disp: , Rfl: 1 .  fluticasone (FLONASE) 50 MCG/ACT nasal spray, Place 1-2 sprays into both nostrils 2 (two) times daily as needed (for allergies.). , Disp: , Rfl: 0 .  Fluticasone-Salmeterol (ADVAIR DISKUS) 500-50 MCG/DOSE AEPB, Inhale 1 puff into the lungs every 12 (twelve) hours.  , Disp: , Rfl:  .  gabapentin (NEURONTIN) 300 MG capsule, TAKE ONE CAPSULE BY MOUTH THREE TIMES DAILY. (TAKE WITH 600MG TABLET TO TOTAL 900MG), Disp: , Rfl: 1 .  gabapentin (NEURONTIN) 600 MG tablet, Take 600 mg by mouth 3 (three) times daily., Disp: , Rfl:  .  insulin aspart (NOVOLOG FLEXPEN) 100 UNIT/ML FlexPen, Inject 20 Units into the skin 3  (three) times daily. 20 units three times a day with sliding scale max of 26 units , Disp: , Rfl:  .  LEVEMIR FLEXTOUCH 100 UNIT/ML Pen, Inject 50 Units into the skin at bedtime., Disp: , Rfl: 1 .  Melatonin 10 MG TABS, Take 10 mg by mouth at bedtime., Disp: , Rfl:  .  meloxicam (MOBIC) 15 MG tablet, Take 15 mg by mouth 2 (two) times daily., Disp: , Rfl:  .  metFORMIN (GLUCOPHAGE) 1000 MG tablet, Take 1,000 mg by mouth 2 (two) times daily. , Disp: , Rfl:  .  naloxone (NARCAN) 2 MG/2ML injection, Inject content of syringe into thigh muscle. Call 911., Disp: 2 Syringe, Rfl: 1 .  naloxone (NARCAN) nasal spray 4 mg/0.1 mL, One spray in either nostril once for known/suspected opioid overdose. May repeat every 2-3 minutes in alternating nostril til EMS arrives, Disp: 1 kit, Rfl: 0 .  nitroGLYCERIN (NITROSTAT) 0.4 MG SL tablet, Place 1 tablet (0.4 mg total) under the tongue every 5 (five) minutes as needed for chest pain., Disp: 90 tablet, Rfl: 3 .  [START ON 09/01/2017] oxyCODONE (ROXICODONE) 15 MG immediate release tablet, Take 1 tablet (15 mg total) by mouth every 6 (six) hours as needed., Disp: 120 tablet, Rfl: 0 .  pantoprazole (PROTONIX) 40 MG tablet, Take 40 mg by mouth 2 (two) times daily., Disp: , Rfl:  .  sacubitril-valsartan (ENTRESTO) 97-103 MG, Take 1 tablet by mouth 2 (two) times daily., Disp: 60 tablet, Rfl: 1 .  tiotropium (SPIRIVA) 18 MCG inhalation capsule, Place 18 mcg into inhaler and inhale daily.  , Disp: , Rfl:  .  Magnesium Oxide 500 MG CAPS, Take 1 capsule (500 mg total) by mouth 2 (two) times daily at 8 am and  10 pm., Disp: 100 capsule, Rfl: PRN .  [START ON 08/02/2017] oxyCODONE (ROXICODONE) 15 MG immediate release tablet, Take 1 tablet (15 mg total) by mouth every 6 (six) hours as needed for pain., Disp: 120 tablet, Rfl: 0 .  [START ON 07/03/2017] oxyCODONE (ROXICODONE) 15 MG immediate release tablet, Take 1 tablet (15 mg total) by mouth every 6 (six) hours as needed for pain., Disp:  120 tablet, Rfl: 0  ROS  Constitutional: Denies any fever or chills Gastrointestinal: No reported hemesis, hematochezia, vomiting, or acute GI distress Musculoskeletal: Denies any acute onset joint swelling, redness, loss of ROM, or weakness Neurological: No reported episodes of acute onset apraxia, aphasia, dysarthria, agnosia, amnesia, paralysis, loss of coordination, or loss of consciousness  Allergies  Mr. Hopping is allergic to lodine [etodolac]; tylenol [acetaminophen]; iodine; and propoxyphene n-acetaminophen.  [place]  Drug: Mr. Macdonnell  reports that he does not use drugs. Alcohol:  reports that he does not drink alcohol. Tobacco:  reports that he quit smoking about 28 years ago. His smoking use included cigarettes. He started smoking about 50 years ago. He has a 28.50 pack-year smoking history. He has never used smokeless tobacco. Medical:  has a past medical history of Anxiety, Arthritis, Cellulitis (2011), Chronic back pain, Claustrophobia, COPD (chronic obstructive pulmonary disease) ([place]), Diabetes mellitus, type 2 ([place]), Diverticulitis of colon (2008), GERD (gastroesophageal reflux disease), Heart attack ([place]) (2017), History of kidney stones, Hyperlipidemia, Hypertension, Obesity, and Pulmonary nodule. Surgical: Mr. Gunther  has a past surgical history that includes Appendectomy (2007); Tonsillectomy; Coronary artery bypass graft (N/A, 02/06/2013); and left heart catheterization with coronary angiogram (N/A, 02/03/2013). Family: family history includes Cancer in his mother; Coronary artery disease in his mother and sister; Diabetes in his sister; Heart attack in his brother and father.  Constitutional Exam  General appearance: Well nourished, well developed, and well hydrated. In no apparent acute distress Vitals:   06/23/17 0855  BP: (!) 187/94  Pulse: 88  Resp: 16  Temp: 97.7 F (36.5 C)  TempSrc: Oral  SpO2: 95%  Weight: 240 lb (108.9 kg)  Height: 5' 9.5" (1.765 m)   Psych/Mental status: Alert, oriented x 3 (person, place, & time)       Eyes: PERLA Respiratory: No evidence of acute respiratory distress   Lumbar Spine Area Exam  Skin & Axial Inspection: No masses, redness, or swelling Alignment: Symmetrical Functional ROM: Unrestricted ROM       Stability: No instability detected Muscle Tone/Strength: Functionally intact. No obvious neuro-muscular anomalies detected. Sensory (Neurological): Unimpaired Palpation: No palpable anomalies       Provocative Tests: Lumbar Hyperextension/rotation test: deferred today       Lumbar quadrant test (Kemp's test): deferred today       Lumbar Lateral bending test: deferred today       Patrick's Maneuver: deferred today                   FABER test: deferred today       Thigh-thrust test: deferred today       S-I compression test: deferred today       S-I distraction test: deferred today        Gait & Posture Assessment  Ambulation: Unassisted Gait: Relatively normal for age and body habitus Posture: WNL   Lower Extremity Exam    Side: Right lower extremity  Side: Left lower extremity  Stability: No instability observed          Stability: No instability observed  Skin & Extremity Inspection: Skin color, temperature, and hair growth are WNL. No peripheral edema or cyanosis. No masses, redness, swelling, asymmetry, or associated skin lesions. No contractures.  Skin & Extremity Inspection: Skin color, temperature, and hair growth are WNL. No peripheral edema or cyanosis. No masses, redness, swelling, asymmetry, or associated skin lesions. No contractures.  Functional ROM: Unrestricted ROM                  Functional ROM: Unrestricted ROM                  Muscle Tone/Strength: Functionally intact. No obvious neuro-muscular anomalies detected.  Muscle Tone/Strength: Functionally intact. No obvious neuro-muscular anomalies detected.  Sensory (Neurological): Unimpaired  Sensory (Neurological): Unimpaired   Palpation: No palpable anomalies  Palpation: No palpable anomalies   Assessment  Primary Diagnosis & Pertinent Problem List: The primary encounter diagnosis was Lumbar spondylosis with lumbar radiculitis (Left). Diagnoses of Lumbar facet syndrome (Bilateral) (L>R), Primary osteoarthritis of both knees, Chronic sacroiliac joint pain (Bilateral) (L>R), Chronic pain syndrome, Hypomagnesemia, and Long term prescription opiate use were also pertinent to this visit.  Status Diagnosis  Controlled Controlled Controlled 1. Lumbar spondylosis with lumbar radiculitis (Left)   2. Lumbar facet syndrome (Bilateral) (L>R)   3. Primary osteoarthritis of both knees   4. Chronic sacroiliac joint pain (Bilateral) (L>R)   5. Chronic pain syndrome   6. Hypomagnesemia   7. Long term prescription opiate use     Problems updated and reviewed during this visit: No problems updated. Plan of Care  Pharmacotherapy (Medications Ordered): Meds ordered this encounter  Medications  . oxyCODONE (ROXICODONE) 15 MG immediate release tablet    Sig: Take 1 tablet (15 mg total) by mouth every 6 (six) hours as needed.    Dispense:  120 tablet    Refill:  0    Do not add this medication to the electronic "Automatic Refill" notification system. Patient may have prescription filled one day early if pharmacy is closed on scheduled refill date. Do not fill until: 09/01/2017 To last until:10/01/2017    Order Specific Question:   Supervising Provider    Answer:   Milinda Pointer [phone]  . oxyCODONE (ROXICODONE) 15 MG immediate release tablet    Sig: Take 1 tablet (15 mg total) by mouth every 6 (six) hours as needed for pain.    Dispense:  120 tablet    Refill:  0    Do not add this medication to the electronic "Automatic Refill" notification system. Patient may have prescription filled one day early if pharmacy is closed on scheduled refill date. Do not fill until:08/02/2017 To last until:09/01/2017    Order Specific  Question:   Supervising Provider    Answer:   Milinda Pointer [phone]  . oxyCODONE (ROXICODONE) 15 MG immediate release tablet    Sig: Take 1 tablet (15 mg total) by mouth every 6 (six) hours as needed for pain.    Dispense:  120 tablet    Refill:  0    Do not add this medication to the electronic "Automatic Refill" notification system. Patient may have prescription filled one day early if pharmacy is closed on scheduled refill date. Do not fill until: 07/03/2017 To last until:08/02/2017    Order Specific Question:   Supervising Provider    Answer:   Milinda Pointer [phone]  . naloxone (NARCAN) nasal spray 4 mg/0.1 mL    Sig: One spray in either nostril once for known/suspected opioid overdose. May repeat  every 2-3 minutes in alternating nostril til EMS arrives    Dispense:  1 kit    Refill:  0    Order Specific Question:   Supervising Provider    Answer:   Milinda Pointer [phone]  . Magnesium Oxide 500 MG CAPS    Sig: Take 1 capsule (500 mg total) by mouth 2 (two) times daily at 8 am and 10 pm.    Dispense:  100 capsule    Refill:  PRN    Do not place this medication, or any other prescription from our practice, on "Automatic Refill". Patient may have prescription filled one day early if pharmacy is closed on scheduled refill date.    Order Specific Question:   Supervising Provider    Answer:   Milinda Pointer [[id number]]   New Prescriptions   No medications on file   Medications administered today: Gad Aymond had no medications administered during this visit. Lab-work, procedure(s), and/or referral(s): Orders Placed This Encounter  Procedures  . ToxASSURE Select 13 (MW), Urine   Imaging and/or referral(s): None Interventional therapies: Planned, scheduled, and/or pending:  Not at this time.   Considering:  Diagnostic bilateralintra-articular knee injection Diagnostic bilateral genicular nerve block Possible bilateral genicular nerve radiofrequency  ablation Diagnostic bilateral lumbar facet block Possible bilaterallumbar facet radiofrequency ablation(Left side done on 03/30/2016) Diagnostic left-sided L4-5 lumbar epidural steroid injection Diagnostic bilateral sacroiliac joint block Possible bilateral sacroiliac joint radiofrequency ablation   Palliative PRN treatment(s):  Palliativebilateral intra-articular knee injection Palliativebilateral genicular nerveblock Palliativebilateral lumbar facetblock Palliativeleft-sided L4-5 lumbar epidural steroid injection Palliativebilateral sacroiliac jointblock     Provider-requested follow-up: Return in about 3 months (around 09/23/2017) for MedMgmt with Me Donella Stade Edison Pace).  Future Appointments  Date Time Provider [place]  09/23/2017  9:00 AM Vevelyn Francois, NP [facility] None   Primary Care Physician: [facility] Location: [facility] Facility Note by: Vevelyn Francois NP Date: 06/23/2017; Time: 10:47 AM  Pain Score Disclaimer: We use the NRS-11 scale. This is a self-reported, subjective measurement of pain severity with only modest accuracy. It is used primarily to identify changes within a particular patient. It must be understood that outpatient pain scales are significantly less accurate that those used for research, where they can be applied under ideal controlled circumstances with minimal exposure to variables. In reality, the score is likely to be a combination of pain intensity and pain affect, where pain affect describes the degree of emotional arousal or changes in action readiness caused by the sensory experience of pain. Factors such as social and work situation, setting, emotional state, anxiety levels, expectation, and prior pain experience may influence pain perception and show large inter-individual differences that may also be affected by time variables.  Patient instructions provided during this  appointment: Patient Instructions   You have been given 3 Rx for Oxycodone to last until 10/01/2017. Rx for magnesium oxide and Narcan has been escribed to your pharmacy.  ____________________________________________________________________________________________  Medication Rules  Applies to: All patients receiving prescriptions (written or electronic).  Pharmacy of record: Pharmacy where electronic prescriptions will be sent. If written prescriptions are taken to a different pharmacy, please inform the nursing staff. The pharmacy listed in the electronic medical record should be the one where you would like electronic prescriptions to be sent.  Prescription refills: Only during scheduled appointments. Applies to both, written and electronic prescriptions.  NOTE: The following applies primarily to controlled substances (Opioid* Pain Medications).   Patient's responsibilities:  1. Pain Pills: Bring all pain pills to every appointment (except for procedure appointments). 2. Pill Bottles: Bring pills in original pharmacy bottle. Always bring newest bottle. Bring bottle, even if empty. 3. Medication refills: You are responsible for knowing and keeping track of what medications you need refilled. The day before your appointment, write a list of all prescriptions that need to be refilled. Bring that list to your appointment and give it to the admitting nurse. Prescriptions will be written only during appointments. If you forget a medication, it will not be "Called in", "Faxed", or "electronically sent". You will need to get another appointment to get these prescribed. 4. Prescription Accuracy: You are responsible for carefully inspecting your prescriptions before leaving our office. Have the discharge nurse carefully go over each prescription with you, before taking them home. Make sure that your name is accurately spelled, that your address is correct. Check the name and dose of your medication to  make sure it is accurate. Check the number of pills, and the written instructions to make sure they are clear and accurate. Make sure that you are given enough medication to last until your next medication refill appointment. 5. Taking Medication: Take medication as prescribed. Never take more pills than instructed. Never take medication more frequently than prescribed. Taking less pills or less frequently is permitted and encouraged, when it comes to controlled substances (written prescriptions).  6. Inform other Doctors: Always inform, all of your healthcare providers, of all the medications you take. 7. Pain Medication from other Providers: You are not allowed to accept any additional pain medication from any other Doctor or Healthcare provider. There are two exceptions to this rule. (see below) In the event that you require additional pain medication, you are responsible for notifying us, as stated below. 8. Medication Agreement: You are responsible for carefully reading and following our Medication Agreement. This must be signed before receiving any prescriptions from our practice. Safely store a copy of your signed Agreement. Violations to the Agreement will result in no further prescriptions. (Additional copies of our Medication Agreement are available upon request.) 9. Laws, Rules, & Regulations: All patients are expected to follow all Federal and [employer], [employer], Rules, [employer]. Ignorance of the Laws does not constitute a valid excuse. The use of any illegal substances is prohibited. 10. Adopted CDC guidelines & recommendations: Target dosing levels will be at or below 60 MME/day. Use of benzodiazepines** is not recommended.  Exceptions: There are only two exceptions to the rule of not receiving pain medications from other Healthcare Providers. 1. Exception #1 (Emergencies): In the event of an emergency (i.e.: accident requiring emergency care), you are allowed to receive additional pain  medication. However, you are responsible for: As soon as you are able, call our office [phone], at any time of the day or night, and leave a message stating your name, the date and nature of the emergency, and the name and dose of the medication prescribed. In the event that your call is answered by a member of our staff, make sure to document and save the date, time, and the name of the person that took your information.  2. Exception #2 (Planned Surgery): In the event that you are scheduled by another doctor or dentist to have any type of surgery or procedure, you are allowed (for a period no longer than 30 days), to receive additional pain medication, for the acute post-op pain. However, in this case, you are responsible for picking  up a copy of our "Post-op Pain Management for Surgeons" handout, and giving it to your surgeon or dentist. This document is available at our office, and does not require an appointment to obtain it. Simply go to our office during business hours (Monday-Thursday from 8:00 AM to 4:00 PM) (Friday 8:00 AM to 12:00 Noon) or if you have a scheduled appointment with [place], prior to your surgery, and ask for it by name. In addition, you will need to provide [place] with your name, name of your surgeon, type of surgery, and date of procedure or surgery.  *Opioid medications include: morphine, codeine, oxycodone, oxymorphone, hydrocodone, hydromorphone, meperidine, tramadol, tapentadol, buprenorphine, fentanyl, methadone. **Benzodiazepine medications include: diazepam (Valium), alprazolam (Xanax), clonazepam (Klonopine), lorazepam (Ativan), clorazepate (Tranxene), chlordiazepoxide (Librium), estazolam (Prosom), oxazepam (Serax), temazepam (Restoril), triazolam (Halcion) (Last updated: 03/11/2017) ____________________________________________________________________________________________   BMI Assessment: Estimated body mass index is 34.93 kg/m as calculated from the following:    Height as of this encounter: 5' 9.5" (1.765 m).   Weight as of this encounter: 240 lb (108.9 kg).  BMI interpretation table: BMI level Category Range association with higher incidence of chronic pain  <18 kg/m2 Underweight   18.5-24.9 kg/m2 Ideal body weight   25-29.9 kg/m2 Overweight Increased incidence by 20%  30-34.9 kg/m2 Obese (Class I) Increased incidence by 68%  35-39.9 kg/m2 Severe obesity (Class II) Increased incidence by 136%  >40 kg/m2 Extreme obesity (Class III) Increased incidence by 254%   Patient's current BMI Ideal Body weight  Body mass index is 34.93 kg/m. Ideal body weight: 71.8 kg (158 lb 6.4 oz) Adjusted ideal body weight: 86.7 kg (191 lb 0.6 oz)   BMI Readings from Last 4 Encounters:  06/23/17 34.93 kg/m  03/22/17 35.74 kg/m  02/17/17 39.72 kg/m  01/01/17 39.52 kg/m   Wt Readings from Last 4 Encounters:  06/23/17 240 lb (108.9 kg)  03/22/17 242 lb (109.8 kg)  02/17/17 269 lb (122 kg)  01/01/17 267 lb 9.6 oz (121.4 kg)

## 2017-06-23 NOTE — Patient Instructions (Addendum)
You have been given 3 Rx for Oxycodone to last until 10/01/2017. Rx for magnesium oxide and Narcan has been escribed to your pharmacy.  ____________________________________________________________________________________________  Medication Rules  Applies to: All patients receiving prescriptions (written or electronic).  Pharmacy of record: Pharmacy where electronic prescriptions will be sent. If written prescriptions are taken to a different pharmacy, please inform the nursing staff. The pharmacy listed in the electronic medical record should be the one where you would like electronic prescriptions to be sent.  Prescription refills: Only during scheduled appointments. Applies to both, written and electronic prescriptions.  NOTE: The following applies primarily to controlled substances (Opioid* Pain Medications).   Patient's responsibilities: 1. Pain Pills: Bring all pain pills to every appointment (except for procedure appointments). 2. Pill Bottles: Bring pills in original pharmacy bottle. Always bring newest bottle. Bring bottle, even if empty. 3. Medication refills: You are responsible for knowing and keeping track of what medications you need refilled. The day before your appointment, write a list of all prescriptions that need to be refilled. Bring that list to your appointment and give it to the admitting nurse. Prescriptions will be written only during appointments. If you forget a medication, it will not be "Called in", "Faxed", or "electronically sent". You will need to get another appointment to get these prescribed. 4. Prescription Accuracy: You are responsible for carefully inspecting your prescriptions before leaving our office. Have the discharge nurse carefully go over each prescription with you, before taking them home. Make sure that your name is accurately spelled, that your address is correct. Check the name and dose of your medication to make sure it is accurate. Check the  number of pills, and the written instructions to make sure they are clear and accurate. Make sure that you are given enough medication to last until your next medication refill appointment. 5. Taking Medication: Take medication as prescribed. Never take more pills than instructed. Never take medication more frequently than prescribed. Taking less pills or less frequently is permitted and encouraged, when it comes to controlled substances (written prescriptions).  6. Inform other Doctors: Always inform, all of your healthcare providers, of all the medications you take. 7. Pain Medication from other Providers: You are not allowed to accept any additional pain medication from any other Doctor or Healthcare provider. There are two exceptions to this rule. (see below) In the event that you require additional pain medication, you are responsible for notifying us, as stated below. 8. Medication Agreement: You are responsible for carefully reading and following our Medication Agreement. This must be signed before receiving any prescriptions from our practice. Safely store a copy of your signed Agreement. Violations to the Agreement will result in no further prescriptions. (Additional copies of our Medication Agreement are available upon request.) 9. Laws, Rules, & Regulations: All patients are expected to follow all Federal and [employer], [employer], Rules, [employer]. Ignorance of the Laws does not constitute a valid excuse. The use of any illegal substances is prohibited. 10. Adopted CDC guidelines & recommendations: Target dosing levels will be at or below 60 MME/day. Use of benzodiazepines** is not recommended.  Exceptions: There are only two exceptions to the rule of not receiving pain medications from other Healthcare Providers. 1. Exception #1 (Emergencies): In the event of an emergency (i.e.: accident requiring emergency care), you are allowed to receive additional pain medication. However, you are  responsible for: As soon as you are able, call our office [phone], at any time of the  day or night, and leave a message stating your name, the date and nature of the emergency, and the name and dose of the medication prescribed. In the event that your call is answered by a member of our staff, make sure to document and save the date, time, and the name of the person that took your information.  2. Exception #2 (Planned Surgery): In the event that you are scheduled by another doctor or dentist to have any type of surgery or procedure, you are allowed (for a period no longer than 30 days), to receive additional pain medication, for the acute post-op pain. However, in this case, you are responsible for picking up a copy of our "Post-op Pain Management for Surgeons" handout, and giving it to your surgeon or dentist. This document is available at our office, and does not require an appointment to obtain it. Simply go to our office during business hours (Monday-Thursday from 8:00 AM to 4:00 PM) (Friday 8:00 AM to 12:00 Noon) or if you have a scheduled appointment with [place], prior to your surgery, and ask for it by name. In addition, you will need to provide [place] with your name, name of your surgeon, type of surgery, and date of procedure or surgery.  *Opioid medications include: morphine, codeine, oxycodone, oxymorphone, hydrocodone, hydromorphone, meperidine, tramadol, tapentadol, buprenorphine, fentanyl, methadone. **Benzodiazepine medications include: diazepam (Valium), alprazolam (Xanax), clonazepam (Klonopine), lorazepam (Ativan), clorazepate (Tranxene), chlordiazepoxide (Librium), estazolam (Prosom), oxazepam (Serax), temazepam (Restoril), triazolam (Halcion) (Last updated: 03/11/2017) ____________________________________________________________________________________________   BMI Assessment: Estimated body mass index is 34.93 kg/m as calculated from the following:   Height as of this encounter: 5'  9.5" (1.765 m).   Weight as of this encounter: 240 lb (108.9 kg).  BMI interpretation table: BMI level Category Range association with higher incidence of chronic pain  <18 kg/m2 Underweight   18.5-24.9 kg/m2 Ideal body weight   25-29.9 kg/m2 Overweight Increased incidence by 20%  30-34.9 kg/m2 Obese (Class I) Increased incidence by 68%  35-39.9 kg/m2 Severe obesity (Class II) Increased incidence by 136%  >40 kg/m2 Extreme obesity (Class III) Increased incidence by 254%   Patient's current BMI Ideal Body weight  Body mass index is 34.93 kg/m. Ideal body weight: 71.8 kg (158 lb 6.4 oz) Adjusted ideal body weight: 86.7 kg (191 lb 0.6 oz)   BMI Readings from Last 4 Encounters:  06/23/17 34.93 kg/m  03/22/17 35.74 kg/m  02/17/17 39.72 kg/m  01/01/17 39.52 kg/m   Wt Readings from Last 4 Encounters:  06/23/17 240 lb (108.9 kg)  03/22/17 242 lb (109.8 kg)  02/17/17 269 lb (122 kg)  01/01/17 267 lb 9.6 oz (121.4 kg)

## 2017-06-23 NOTE — Progress Notes (Signed)
Nursing Pain Medication Assessment:  Safety precautions to be maintained throughout the outpatient stay will include: orient to surroundings, keep bed in low position, maintain call bell within reach at all times, provide assistance with transfer out of bed and ambulation.  Medication Inspection Compliance: Pill count conducted under aseptic conditions, in front of the patient. Neither the pills nor the bottle was removed from the patient's sight at any time. Once count was completed pills were immediately returned to the patient in their original bottle.  Medication: Oxycodone IR Pill/Patch Count: 39 of 120 pills remain Pill/Patch Appearance: Markings consistent with prescribed medication Bottle Appearance: Standard pharmacy container. Clearly labeled. Filled Date: 5 / 23 / 2019 Last Medication intake:  Today

## 2017-06-30 LAB — TOXASSURE SELECT 13 (MW), URINE

## 2017-07-20 IMAGING — CT CT ABD-PELV W/ CM
2 of 9 series · 15 of 46 positions shown, 17 images · IV contrast (Omnipaque 300)
Comparison: Scrotal ultrasound 03/12/2015 and CT scan abdomen and
pelvis 08/10/2010

CLINICAL DATA: Left scrotal pain and swelling for 4 days, opened
and drained last night, status post appendectomy

EXAM:
CT ABDOMEN AND PELVIS WITH CONTRAST
TECHNIQUE: Multidetector CT imaging of the abdomen and pelvis was performed
using the standard protocol following bolus administration of
intravenous contrast.
CONTRAST:  100mL OMNIPAQUE IOHEXOL 300 MG/ML  SOLN

[Series 5: abd_pel_with 5.0 b40f · axial · 0.84mm/px · z∈[+32,+512]mm · 12 of 112 slices shown, 14 images]
[im 8/112  soft-tissue]
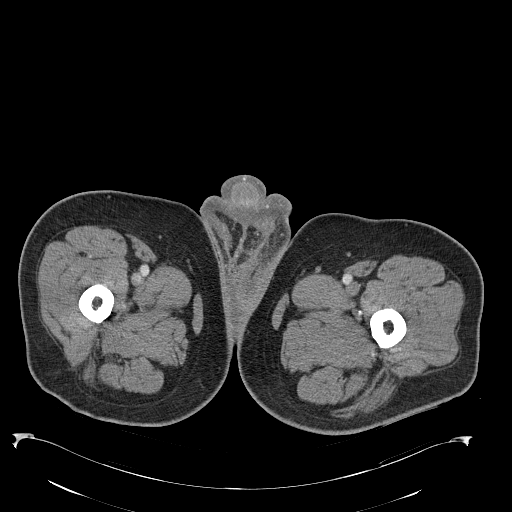
[im 8/112  bone]
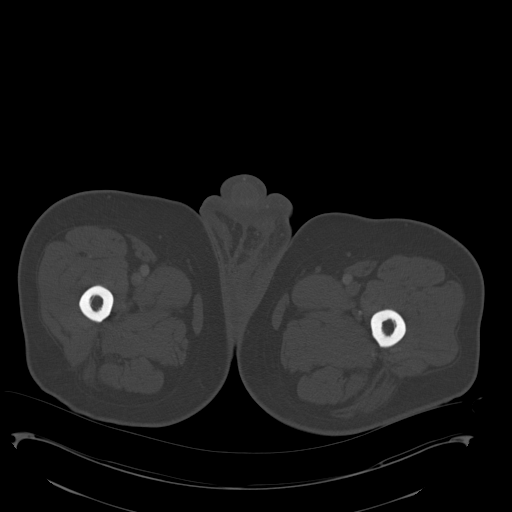
[im 16/112  soft-tissue]
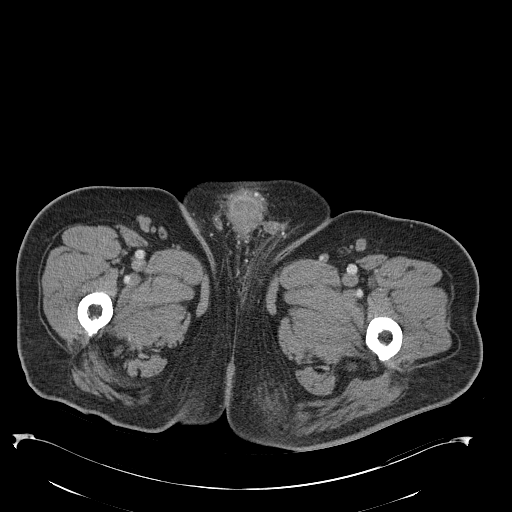
[im 24/112  soft-tissue]
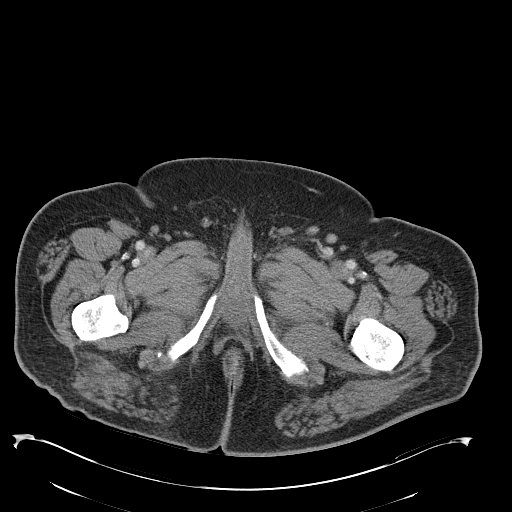
[im 32/112  soft-tissue]
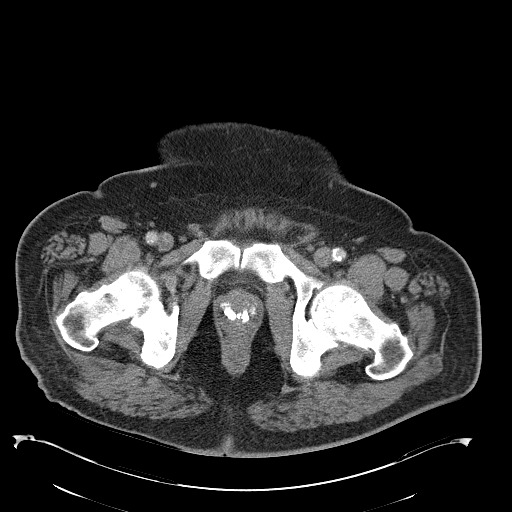
[im 40/112  soft-tissue]
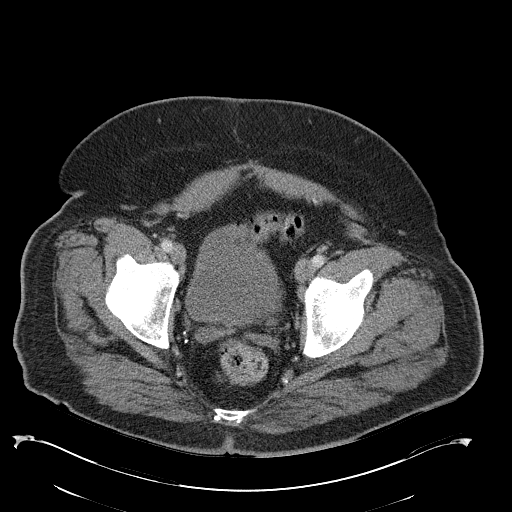
[im 48/112  soft-tissue]
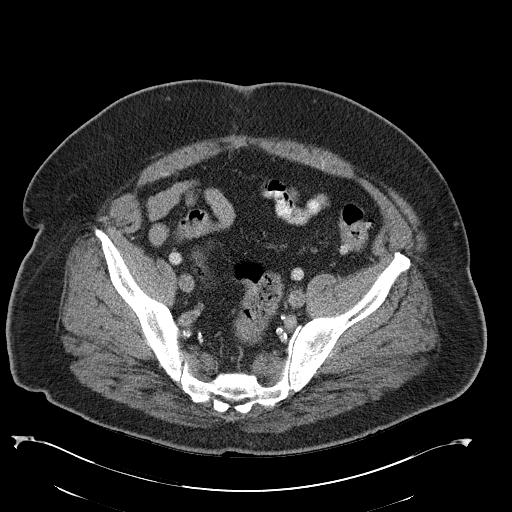
[im 64/112  soft-tissue]
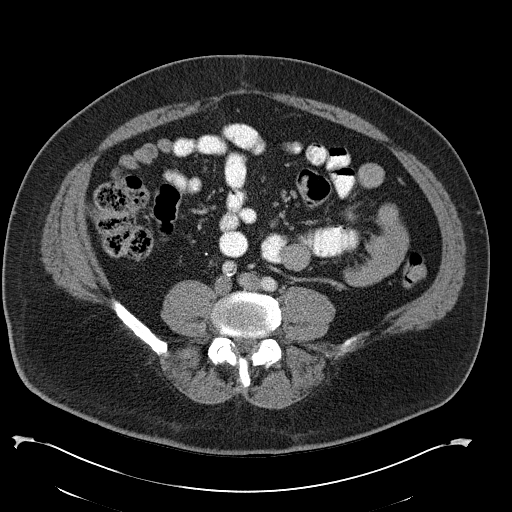
[im 72/112  soft-tissue]
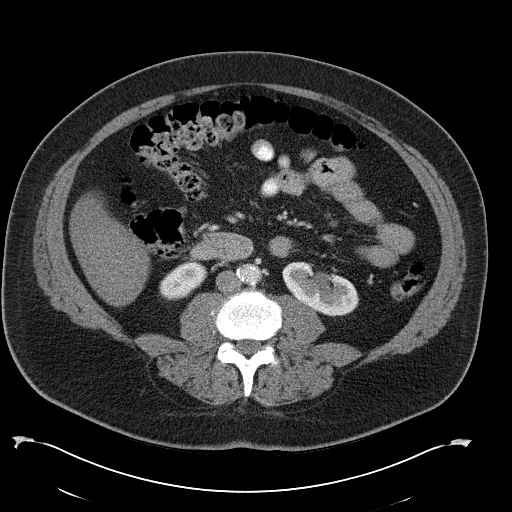
[im 80/112  soft-tissue]
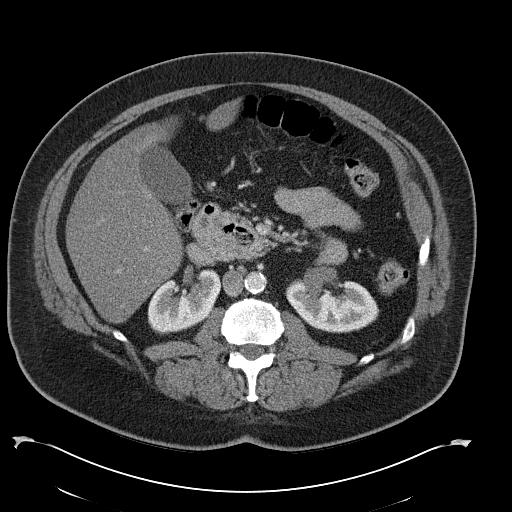
[im 80/112  bone]
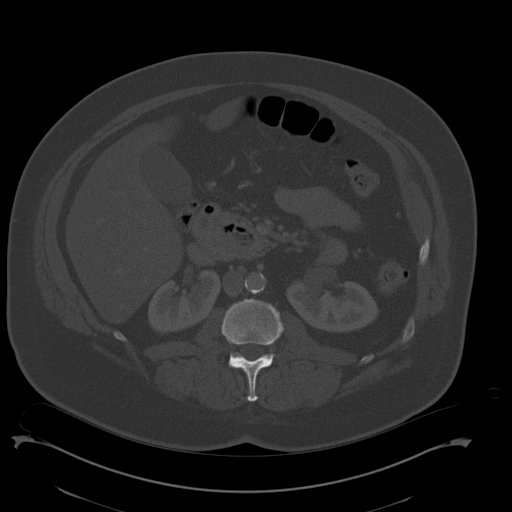
[im 88/112  soft-tissue]
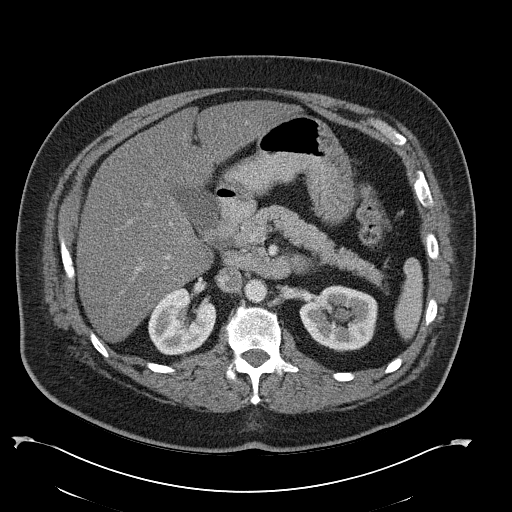
[im 96/112  soft-tissue]
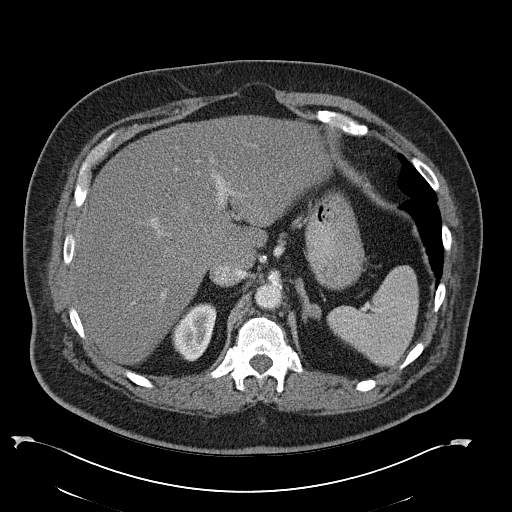
[im 104/112  soft-tissue]
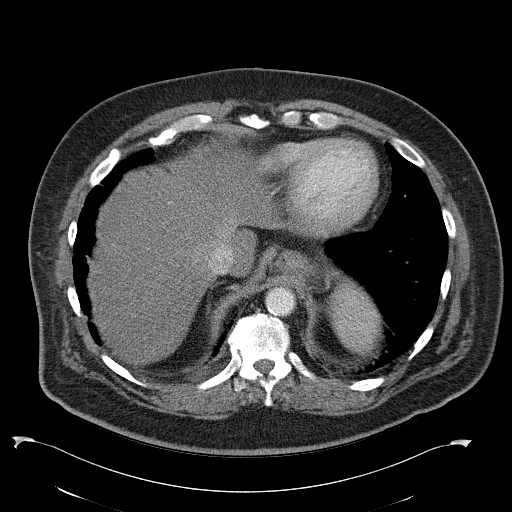

[Series 6: abd_pel_with 3.0 spo cor · coronal · 0.89mm/px · 3 of 99 slices shown]
[im 25/99  soft-tissue]
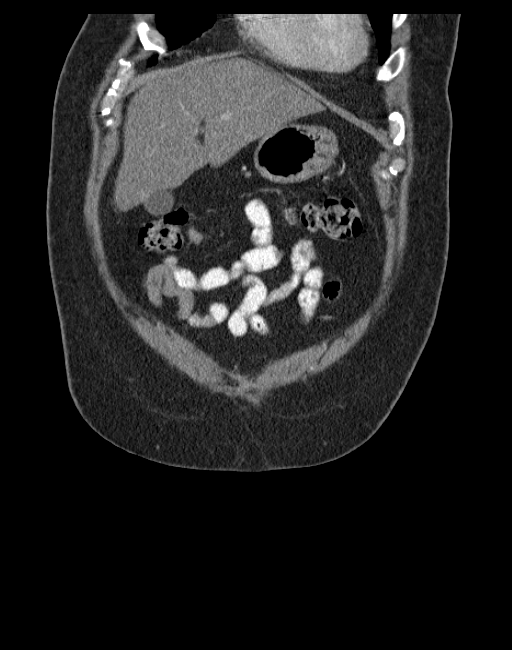
[im 50/99  soft-tissue]
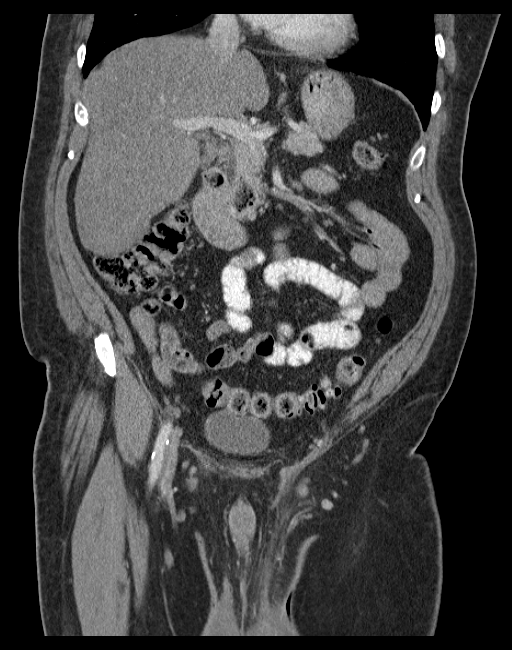
[im 74/99  soft-tissue]
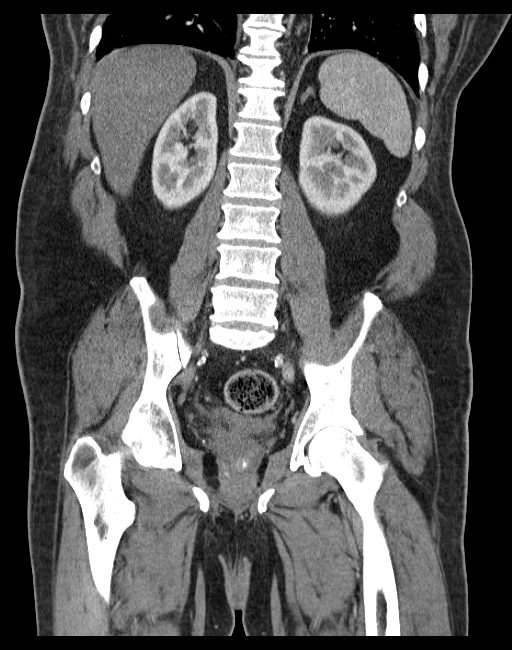

[15 of 46 positions shown; findings below may reference images not displayed]

FINDINGS: Sagittal images of the spine shows mild degenerative changes
thoracolumbar spine. There is small right pleural effusion with
right base posterior atelectasis.

Significant fatty infiltration of the liver again noted. No focal
hepatic mass. No calcified gallstones are noted within gallbladder.
A distal duodenum diverticulum containing some air and contrast
material again noted without significant change from prior exam.

Enhanced pancreas, spleen and adrenal glands are unremarkable.
Enhanced kidneys are symmetrical in size. No hydronephrosis or
hydroureter.

Delayed renal images shows bilateral renal symmetrical excretion.

Tiny hiatal hernia is noted.

Atherosclerotic calcifications of abdominal aorta and iliac
arteries. No aortic aneurysm.

No small bowel obstruction.  No ascites or free air.  No adenopathy.

There is no pericecal inflammation. The patient is status post
appendectomy. Some colonic stool and gas noted in right colon and
transverse colon. No colonic obstruction. No diverticulitis. Few
diverticula are noted proximal sigmoid colon. No acute
diverticulitis. Some colonic gas and stool noted in sigmoid colon
and rectum. Prostate gland calcifications are noted. The the

As noted on recent scrotal ultrasound there is skin thickening and
subcutaneous edema and stranding in left posterior scrotum/anterior
perineum. Small irregular fluid noted in left posterior scrotum
axial image 106 superficial measures about 1.7 cm. This corresponds
to focal hyperechoic area noted on ultrasound. This is highly
suspicious for focal cellulitis and subcutaneous edema. Early
subcutaneous abscess cannot be excluded. Clinical correlation is
necessary. There is no evidence of subcutaneous air or air-fluid
level. Small bilateral hydrocele is noted.

A right inguinal lymph node measures 1.2 cm in diameter. Left
inguinal lymph node measures 1.2 cm in diameter. These are
borderline enlarged by size criteria probable reactive.
IMPRESSION: 1. As noted on recent scrotal ultrasound there is skin thickening
and subcutaneous edema and stranding in left posterior
scrotum/anterior perineum. Small subcutaneous irregular fluid noted
in left posterior scrotum axial image 106 measures about 1.7 cm.
This corresponds to focal hyperechoic area noted on ultrasound. This
is highly suspicious for focal cellulitis and subcutaneous edema.
Early subcutaneous abscess cannot be excluded. Clinical correlation
is necessary. There is no evidence of subcutaneous air or air-fluid
level.
2. Small bilateral hydrocele.
3. Borderline bilateral inguinal enlarged lymph nodes probable
reactive.
4. Again noted significant fatty infiltration of the liver.
5. No hydronephrosis or hydroureter.
6. Small right pleural effusion with right basilar atelectasis.
7. Stable distal duodenum diverticulum.

## 2017-09-20 ENCOUNTER — Telehealth: Payer: Self-pay | Admitting: Cardiovascular Disease

## 2017-09-20 NOTE — Telephone Encounter (Signed)
Called to make appointment and mentioned that he has been feelign "flutting" in his chest

## 2017-09-20 NOTE — Telephone Encounter (Signed)
No c/o dizziness, chest pain, or sob.  Been going on off/on x 1 month.  Describes heart as feeling "like a phone ringing".  97% 82 HR - checked by patient during conversation.  Patient did have EKG done 02/17/2017 for Dr. Oneida Alar which was normal.  Drinks 1 cup coffee in the morning only & no alcohol.  Last seen 01/23/2015.  Patient scheduled OV for 11/10/2017.

## 2017-09-23 ENCOUNTER — Other Ambulatory Visit: Payer: Self-pay

## 2017-09-23 ENCOUNTER — Encounter: Payer: Self-pay | Admitting: Nurse Practitioner

## 2017-09-23 ENCOUNTER — Ambulatory Visit: Payer: Medicare Other | Attending: Nurse Practitioner | Admitting: Nurse Practitioner

## 2017-09-23 VITALS — BP 155/88 | HR 77 | Temp 97.7°F | Ht 70.0 in | Wt 248.0 lb

## 2017-09-23 DIAGNOSIS — M533 Sacrococcygeal disorders, not elsewhere classified: Secondary | ICD-10-CM | POA: Insufficient documentation

## 2017-09-23 DIAGNOSIS — I2510 Atherosclerotic heart disease of native coronary artery without angina pectoris: Secondary | ICD-10-CM | POA: Diagnosis not present

## 2017-09-23 DIAGNOSIS — M79672 Pain in left foot: Secondary | ICD-10-CM | POA: Insufficient documentation

## 2017-09-23 DIAGNOSIS — M79604 Pain in right leg: Secondary | ICD-10-CM | POA: Diagnosis not present

## 2017-09-23 DIAGNOSIS — J449 Chronic obstructive pulmonary disease, unspecified: Secondary | ICD-10-CM | POA: Insufficient documentation

## 2017-09-23 DIAGNOSIS — Z79891 Long term (current) use of opiate analgesic: Secondary | ICD-10-CM | POA: Diagnosis not present

## 2017-09-23 DIAGNOSIS — M5416 Radiculopathy, lumbar region: Secondary | ICD-10-CM | POA: Diagnosis not present

## 2017-09-23 DIAGNOSIS — M549 Dorsalgia, unspecified: Secondary | ICD-10-CM | POA: Diagnosis present

## 2017-09-23 DIAGNOSIS — M79671 Pain in right foot: Secondary | ICD-10-CM | POA: Diagnosis not present

## 2017-09-23 DIAGNOSIS — I5022 Chronic systolic (congestive) heart failure: Secondary | ICD-10-CM | POA: Diagnosis not present

## 2017-09-23 DIAGNOSIS — G894 Chronic pain syndrome: Secondary | ICD-10-CM | POA: Diagnosis not present

## 2017-09-23 DIAGNOSIS — M47816 Spondylosis without myelopathy or radiculopathy, lumbar region: Secondary | ICD-10-CM

## 2017-09-23 DIAGNOSIS — M25562 Pain in left knee: Secondary | ICD-10-CM

## 2017-09-23 DIAGNOSIS — Z794 Long term (current) use of insulin: Secondary | ICD-10-CM | POA: Insufficient documentation

## 2017-09-23 DIAGNOSIS — E1165 Type 2 diabetes mellitus with hyperglycemia: Secondary | ICD-10-CM | POA: Insufficient documentation

## 2017-09-23 DIAGNOSIS — I110 Hypertensive heart disease with heart failure: Secondary | ICD-10-CM | POA: Diagnosis not present

## 2017-09-23 DIAGNOSIS — M4726 Other spondylosis with radiculopathy, lumbar region: Secondary | ICD-10-CM | POA: Diagnosis not present

## 2017-09-23 DIAGNOSIS — M170 Bilateral primary osteoarthritis of knee: Secondary | ICD-10-CM | POA: Insufficient documentation

## 2017-09-23 DIAGNOSIS — K219 Gastro-esophageal reflux disease without esophagitis: Secondary | ICD-10-CM | POA: Diagnosis not present

## 2017-09-23 DIAGNOSIS — M545 Low back pain: Secondary | ICD-10-CM | POA: Insufficient documentation

## 2017-09-23 DIAGNOSIS — M25561 Pain in right knee: Secondary | ICD-10-CM | POA: Diagnosis not present

## 2017-09-23 DIAGNOSIS — E1142 Type 2 diabetes mellitus with diabetic polyneuropathy: Secondary | ICD-10-CM | POA: Diagnosis not present

## 2017-09-23 DIAGNOSIS — M79605 Pain in left leg: Secondary | ICD-10-CM | POA: Insufficient documentation

## 2017-09-23 DIAGNOSIS — E785 Hyperlipidemia, unspecified: Secondary | ICD-10-CM | POA: Diagnosis not present

## 2017-09-23 DIAGNOSIS — G8929 Other chronic pain: Secondary | ICD-10-CM

## 2017-09-23 MED ORDER — OXYCODONE HCL 15 MG PO TABS: 15.0000 mg | ORAL_TABLET | Freq: Four times a day (QID) | ORAL | 0 refills | Status: DC | PRN

## 2017-09-23 MED ORDER — MAGNESIUM OXIDE -MG SUPPLEMENT 500 MG PO CAPS: 1.0000 | ORAL_CAPSULE | Freq: Two times a day (BID) | ORAL | 99 refills | Status: DC

## 2017-09-23 NOTE — Patient Instructions (Signed)
____________________________________________________________________________________________  Medication Rules  Applies to: All patients receiving prescriptions (written or electronic).  Pharmacy of record: Pharmacy where electronic prescriptions will be sent. If written prescriptions are taken to a different pharmacy, please inform the nursing staff. The pharmacy listed in the electronic medical record should be the one where you would like electronic prescriptions to be sent.  Prescription refills: Only during scheduled appointments. Applies to both, written and electronic prescriptions.  NOTE: The following applies primarily to controlled substances (Opioid* Pain Medications).   Patient's responsibilities: 1. Pain Pills: Bring all pain pills to every appointment (except for procedure appointments). 2. Pill Bottles: Bring pills in original pharmacy bottle. Always bring newest bottle. Bring bottle, even if empty. 3. Medication refills: You are responsible for knowing and keeping track of what medications you need refilled. The day before your appointment, write a list of all prescriptions that need to be refilled. Bring that list to your appointment and give it to the admitting nurse. Prescriptions will be written only during appointments. If you forget a medication, it will not be "Called in", "Faxed", or "electronically sent". You will need to get another appointment to get these prescribed. 4. Prescription Accuracy: You are responsible for carefully inspecting your prescriptions before leaving our office. Have the discharge nurse carefully go over each prescription with you, before taking them home. Make sure that your name is accurately spelled, that your address is correct. Check the name and dose of your medication to make sure it is accurate. Check the number of pills, and the written instructions to make sure they are clear and accurate. Make sure that you are given enough medication to last  until your next medication refill appointment. 5. Taking Medication: Take medication as prescribed. Never take more pills than instructed. Never take medication more frequently than prescribed. Taking less pills or less frequently is permitted and encouraged, when it comes to controlled substances (written prescriptions).  6. Inform other Doctors: Always inform, all of your healthcare providers, of all the medications you take. 7. Pain Medication from other Providers: You are not allowed to accept any additional pain medication from any other Doctor or Healthcare provider. There are two exceptions to this rule. (see below) In the event that you require additional pain medication, you are responsible for notifying us, as stated below. 8. Medication Agreement: You are responsible for carefully reading and following our Medication Agreement. This must be signed before receiving any prescriptions from our practice. Safely store a copy of your signed Agreement. Violations to the Agreement will result in no further prescriptions. (Additional copies of our Medication Agreement are available upon request.) 9. Laws, Rules, & Regulations: All patients are expected to follow all Federal and State Laws, Statutes, Rules, & Regulations. Ignorance of the Laws does not constitute a valid excuse. The use of any illegal substances is prohibited. 10. Adopted CDC guidelines & recommendations: Target dosing levels will be at or below 60 MME/day. Use of benzodiazepines** is not recommended.  Exceptions: There are only two exceptions to the rule of not receiving pain medications from other Healthcare Providers. 1. Exception #1 (Emergencies): In the event of an emergency (i.e.: accident requiring emergency care), you are allowed to receive additional pain medication. However, you are responsible for: As soon as you are able, call our office [phone], at any time of the day or night, and leave a message stating your name, the  date and nature of the emergency, and the name and dose of the medication   prescribed. In the event that your call is answered by a member of our staff, make sure to document and save the date, time, and the name of the person that took your information.  2. Exception #2 (Planned Surgery): In the event that you are scheduled by another doctor or dentist to have any type of surgery or procedure, you are allowed (for a period no longer than 30 days), to receive additional pain medication, for the acute post-op pain. However, in this case, you are responsible for picking up a copy of our "Post-op Pain Management for Surgeons" handout, and giving it to your surgeon or dentist. This document is available at our office, and does not require an appointment to obtain it. Simply go to our office during business hours (Monday-Thursday from 8:00 AM to 4:00 PM) (Friday 8:00 AM to 12:00 Noon) or if you have a scheduled appointment with us, prior to your surgery, and ask for it by name. In addition, you will need to provide us with your name, name of your surgeon, type of surgery, and date of procedure or surgery.  *Opioid medications include: morphine, codeine, oxycodone, oxymorphone, hydrocodone, hydromorphone, meperidine, tramadol, tapentadol, buprenorphine, fentanyl, methadone. **Benzodiazepine medications include: diazepam (Valium), alprazolam (Xanax), clonazepam (Klonopine), lorazepam (Ativan), clorazepate (Tranxene), chlordiazepoxide (Librium), estazolam (Prosom), oxazepam (Serax), temazepam (Restoril), triazolam (Halcion) (Last updated: 03/11/2017) ____________________________________________________________________________________________    

## 2017-09-23 NOTE — Progress Notes (Signed)
Patient's Name: Manuel Knight  MRN: [id number]  Referring Provider: [facility]  DOB: 1960/07/09  PCP: [facility]  DOS: 09/23/2017  Note by: Vevelyn Francois NP  Service setting: Ambulatory outpatient  Specialty: Interventional Pain Management  Location: [facility] (AMB) Pain Management Facility    Patient type: Established    Primary Reason(s) for Visit: Encounter for prescription drug management. (Level of risk: moderate)  CC: Back Pain  HPI  Manuel Knight is a 57 y.o. year old, male patient, who comes today for a medication management evaluation. He has Type 2 diabetes mellitus with hyperglycemia, with long-term current use of insulin ([place]); HLD (hyperlipidemia); Obesity, unspecified; Anxiety state; Essential hypertension; COPD with emphysema ([place]); PULMONARY NODULE; HERNIA; Abdominal pain; GERD (gastroesophageal reflux disease); Esophageal dysphagia; NSTEMI (non-ST elevated myocardial infarction) ([place]); CAD (coronary artery disease), native coronary artery; S/P CABG x 3; Chronic systolic heart failure ([place]); Emphysema of lung ([place]); Lumbar spondylosis with lumbar radiculitis (Left); H/O coronary artery bypass surgery; Encounter for pain management planning; Encounter for therapeutic drug level monitoring; Opiate use (90 MME/Day); Chronic low back pain (Location of Secondary source of pain) (Bilateral) (L>R); Chronic knee pain (Location of Primary Source of Pain) (Bilateral) (L>R); Chronic foot pain (Bilateral) (L>R); Disturbance of skin sensation; Diabetic peripheral neuropathy (HCC) (lower extremity); Chronic lumbar radicular pain (L5 dermatome) (Right); Chronic sacroiliac joint pain (Bilateral) (L>R); Osteoarthritis of knee (Bilateral) (L>R); Lumbar facet syndrome (Bilateral) (L>R); Lumbar facet arthropathy; Chronic lower extremity pain (Location of Tertiary source of pain) (Bilateral) (R>L); History of marijuana use; Hypomagnesemia; Vitamin D insufficiency; Long term  current use of opiate analgesic; Long term prescription opiate use; Chronic pain syndrome; SIRS (systemic inflammatory response syndrome) ([place]); Spondylosis of lumbar spine; and Encounter for screening colonoscopy on their problem list. His primarily concern today is the Back Pain  Pain Assessment: Location: Lower Back Radiating: pain radiaties down both legs to foot and toes, great toes become numb Onset: More than a month ago Duration: Chronic pain Quality:   Severity: 2 /10 (subjective, self-reported pain score)  Note: Reported level is compatible with observation.                          Effect on ADL: limits my daily activites Timing: Constant Modifying factors: medications BP: (!) 155/88  HR: 77  Manuel Knight was last scheduled for an appointment on 06/23/2017 for medication management. During today's appointment we reviewed Manuel Knight's chronic pain status, as well as his outpatient medication regimen. He admits that the left side is worse than the right. He states that he is trying to do more.  He denies any new concerns today.  The patient  reports that he does not use drugs. His body mass index is 35.58 kg/m.  Further details on both, my assessment(s), as well as the proposed treatment plan, please see below.  Controlled Substance Pharmacotherapy Assessment REMS (Risk Evaluation and Mitigation Strategy)  Analgesic:Oxycodone IR 15 mg every 6 hours (60 mg/day) MME/day:90 mg/day. Manuel Fischer, RN  09/23/2017  9:39 AM  Sign at close encounter Nursing Pain Medication Assessment:  Safety precautions to be maintained throughout the outpatient stay will include: orient to surroundings, keep bed in low position, maintain call bell within reach at all times, provide assistance with transfer out of bed and ambulation.  Medication Inspection Compliance: Pill count conducted under aseptic conditions, in front of the patient. Neither the pills nor the bottle was removed  from the  patient's sight at any time. Once count was completed pills were immediately returned to the patient in their original bottle.  Medication: Oxycodone IR Pill/Patch Count: 36 of 120 pills remain Pill/Patch Appearance: Markings consistent with prescribed medication Bottle Appearance: Standard pharmacy container. Clearly labeled. Filled Date: 8 / 21 / 2019 Last Medication intake:  Today   Pharmacokinetics: Liberation and absorption (onset of action): WNL Distribution (time to peak effect): WNL Metabolism and excretion (duration of action): WNL         Pharmacodynamics: Desired effects: Analgesia: Manuel Knight reports >50% benefit. Functional ability: Patient reports that medication allows him to accomplish basic ADLs Clinically meaningful improvement in function (CMIF): Sustained CMIF goals met Perceived effectiveness: Described as relatively effective, allowing for increase in activities of daily living (ADL) Undesirable effects: Side-effects or Adverse reactions: None reported Monitoring: [place] PMP: Online review of the past 27-monthperiod conducted. Compliant with practice rules and regulations Last UDS on record: Summary  Date Value Ref Range Status  06/23/2017 FINAL  Final    Comment:    ==================================================================== TOXASSURE SELECT 13 (MW) ==================================================================== Specimen Alert Note:  Sample is inconsistent with human urine. ==================================================================== Test                             Result       Flag       Units Drug Absent but Declared for Prescription Verification   Amphetamine                    Not Detected UNEXPECTED ng/mg creat   Alprazolam                     Not Detected UNEXPECTED ng/mg creat   Oxycodone                      Not Detected UNEXPECTED ng/mg creat ==================================================================== Test                       Result    Flag   Units      Ref Range   Creatinine                               mg/dL      >=20 ==================================================================== Declared Medications:  The flagging and interpretation on this report are based on the  following declared medications.  Unexpected results may arise from  inaccuracies in the declared medications.  **Note: The testing scope of this panel includes these medications:  Alprazolam (Xanax)  Amphetamine (Adderall)  Oxycodone (Roxicodone)  **Note: The testing scope of this panel does not include following  reported medications:  Albuterol  Amlodipine (Norvasc)  Aspirin (Aspirin 81)  Atorvastatin (Lipitor)  Canagliflozin (Invokana)  Carvedilol (Coreg)  Citalopram (Celexa)  Clonidine (Catapres)  Fluticasone (Advair)  Fluticasone (Flonase)  Gabapentin  Insulin (Levemir)  Insulin (NovoLog)  Magnesium Oxide  Melatonin  Meloxicam (Mobic)  Metformin (Glucophage)  Naloxone (Narcan)  Nitroglycerin (Nitrostat)  Pantoprazole (Protonix)  Sacubitril (Entresto)  Salmeterol (Advair)  Tiotropium (Spiriva)  Valsartan (Entresto)  Vitamin D3 ==================================================================== For clinical consultation, please call ([phone] ====================================================================    UDS interpretation: Non-Compliant          Medication Assessment Form: Reviewed. Patient indicates being compliant with therapy Treatment compliance: Compliant Risk Assessment Profile: Aberrant behavior: See prior evaluations.  None observed or detected today Comorbid factors increasing risk of overdose: See prior notes. No additional risks detected today Opioid risk tool (ORT) (Total Score): 0 Personal History of Substance Abuse (SUD-Substance use disorder):  Alcohol: Negative  Illegal Drugs: Negative  Rx Drugs: Negative  ORT Risk Level calculation: Low Risk Risk of substance use  disorder (SUD): Low Opioid Risk Tool - 09/23/17 0949      Family History of Substance Abuse   Alcohol  Negative    Illegal Drugs  Negative    Rx Drugs  Negative      Personal History of Substance Abuse   Alcohol  Negative    Illegal Drugs  Negative    Rx Drugs  Negative      History of Preadolescent Sexual Abuse   History of Preadolescent Sexual Abuse  Negative or Male      Psychological Disease   Psychological Disease  Negative    Depression  Negative      Total Score   Opioid Risk Tool Scoring  0    Opioid Risk Interpretation  Low Risk      ORT Scoring interpretation table:  Score <3 = Low Risk for SUD  Score between 4-7 = Moderate Risk for SUD  Score >8 = High Risk for Opioid Abuse   Risk Mitigation Strategies:  Patient Counseling: Covered Patient-Prescriber Agreement (PPA): Present and active  Notification to other healthcare providers: Done  Pharmacologic Plan: No change in therapy, at this time.             Laboratory Chemistry  Inflammation Markers (CRP: Acute Phase) (ESR: Chronic Phase) Lab Results  Component Value Date   CRP 5.8 (H) 06/04/2016   ESRSEDRATE 2 06/04/2016   LATICACIDVEN 1.3 03/12/2015                         Rheumatology Markers Lab Results  Component Value Date   LABURIC 5.9 02/04/2013                        Renal Function Markers Lab Results  Component Value Date   BUN 17 02/17/2017   CREATININE 0.95 02/17/2017   BCR 19 06/04/2016   GFRAA >60 02/17/2017   GFRNONAA >60 02/17/2017                             Hepatic Function Markers Lab Results  Component Value Date   AST 16 06/04/2016   ALT 26 06/04/2016   ALBUMIN 4.3 06/04/2016   ALKPHOS 63 06/04/2016   HCVAB NEGATIVE 11/22/2008   LIPASE 35 11/21/2008                        Electrolytes Lab Results  Component Value Date   NA 139 02/17/2017   K 4.8 02/17/2017   CL 103 02/17/2017   CALCIUM 9.5 02/17/2017   MG 1.7 06/04/2016   PHOS 3.2 03/12/2015                         Neuropathy Markers Lab Results  Component Value Date   VITAMINB12 473 06/04/2016   HGBA1C 13.8 (H) 03/12/2015   HIV Non Reactive 03/15/2015                        CNS Tests Lab Results  Component Value  Date   SDES BLOOD RIGHT HAND 03/12/2015   CULT NO GROWTH 6 DAYS 03/12/2015                        Bone Pathology Markers Lab Results  Component Value Date   VD25OH 31 02/03/2010   25OHVITD1 31 06/04/2016   25OHVITD2 1.9 06/04/2016   25OHVITD3 29 06/04/2016                         Coagulation Parameters Lab Results  Component Value Date   INR 1.59 (H) 02/06/2013   LABPROT 18.5 (H) 02/06/2013   APTT 40 (H) 02/06/2013   PLT 267 02/17/2017   DDIMER 0.32 05/21/2011                        Cardiovascular Markers Lab Results  Component Value Date   CKTOTAL 63 05/22/2011   CKMB 2.1 05/22/2011   TROPONINI 3.91 (HH) 02/04/2013   HGB 14.9 02/17/2017   HCT 46.7 02/17/2017                         CA Markers No results found for: CEA, CA125, LABCA2                      Note: Lab results reviewed.  Recent Diagnostic Imaging Results  DG C-Arm 1-60 Min-No Report Fluoroscopy was utilized by the requesting physician.  No radiographic  interpretation.   Complexity Note: Imaging results reviewed. Results shared with Mr. Membreno, using Layman's terms.                         Meds   Current Outpatient Medications:  .  albuterol (PROVENTIL) (2.5 MG/3ML) 0.083% nebulizer solution, Inhale 2.5 mg into the lungs 3 (three) times daily. , Disp: , Rfl:  .  alprazolam (XANAX) 2 MG tablet, Take 2 mg by mouth 3 (three) times daily. , Disp: , Rfl:  .  amLODipine (NORVASC) 10 MG tablet, Take 10 mg by mouth daily., Disp: , Rfl:  .  amphetamine-dextroamphetamine (ADDERALL) 30 MG tablet, Take 30 mg by mouth 3 (three) times daily. , Disp: , Rfl:  .  aspirin EC 81 MG tablet, Take 1 tablet (81 mg total) by mouth daily., Disp: , Rfl:  .  atorvastatin (LIPITOR) 80 MG tablet, Take 80 mg by  mouth daily. , Disp: , Rfl:  .  Canagliflozin (INVOKANA) 100 MG TABS, Take 100 mg by mouth daily., Disp: , Rfl:  .  carvedilol (COREG) 12.5 MG tablet, Take 12.5 mg by mouth 2 (two) times daily., Disp: , Rfl:  .  Cholecalciferol (VITAMIN D3) 2000 units capsule, Take 2,000 Units by mouth daily., Disp: , Rfl: 1 .  citalopram (CELEXA) 20 MG tablet, Take 20 mg by mouth daily. , Disp: , Rfl:  .  cloNIDine (CATAPRES) 0.3 MG tablet, Take 0.3 mg by mouth 2 (two) times daily., Disp: , Rfl: 1 .  fluticasone (FLONASE) 50 MCG/ACT nasal spray, Place 1-2 sprays into both nostrils 2 (two) times daily as needed (for allergies.). , Disp: , Rfl: 0 .  Fluticasone-Salmeterol (ADVAIR DISKUS) 500-50 MCG/DOSE AEPB, Inhale 1 puff into the lungs every 12 (twelve) hours.  , Disp: , Rfl:  .  gabapentin (NEURONTIN) 300 MG capsule, TAKE ONE CAPSULE BY MOUTH THREE TIMES DAILY. (TAKE WITH 600MG TABLET TO TOTAL 900MG),   Disp: , Rfl: 1 .  gabapentin (NEURONTIN) 600 MG tablet, Take 600 mg by mouth 3 (three) times daily., Disp: , Rfl:  .  insulin aspart (NOVOLOG FLEXPEN) 100 UNIT/ML FlexPen, Inject 20 Units into the skin 3 (three) times daily. 20 units three times a day with sliding scale max of 26 units , Disp: , Rfl:  .  Ipratropium-Albuterol (COMBIVENT RESPIMAT) 20-100 MCG/ACT AERS respimat, Inhale 1 puff into the lungs every 6 (six) hours., Disp: , Rfl:  .  LEVEMIR FLEXTOUCH 100 UNIT/ML Pen, Inject 50 Units into the skin at bedtime., Disp: , Rfl: 1 .  Melatonin 10 MG TABS, Take 10 mg by mouth at bedtime., Disp: , Rfl:  .  meloxicam (MOBIC) 15 MG tablet, Take 15 mg by mouth 2 (two) times daily., Disp: , Rfl:  .  metFORMIN (GLUCOPHAGE) 1000 MG tablet, Take 1,000 mg by mouth 2 (two) times daily. , Disp: , Rfl:  .  naloxone (NARCAN) 2 MG/2ML injection, Inject content of syringe into thigh muscle. Call 911., Disp: 2 Syringe, Rfl: 1 .  naloxone (NARCAN) nasal spray 4 mg/0.1 mL, One spray in either nostril once for known/suspected opioid  overdose. May repeat every 2-3 minutes in alternating nostril til EMS arrives, Disp: 1 kit, Rfl: 0 .  nitroGLYCERIN (NITROSTAT) 0.4 MG SL tablet, Place 1 tablet (0.4 mg total) under the tongue every 5 (five) minutes as needed for chest pain., Disp: 90 tablet, Rfl: 3 .  [START ON 11/30/2017] oxyCODONE (ROXICODONE) 15 MG immediate release tablet, Take 1 tablet (15 mg total) by mouth every 6 (six) hours as needed., Disp: 120 tablet, Rfl: 0 .  pantoprazole (PROTONIX) 40 MG tablet, Take 40 mg by mouth 2 (two) times daily., Disp: , Rfl:  .  sacubitril-valsartan (ENTRESTO) 97-103 MG, Take 1 tablet by mouth 2 (two) times daily., Disp: 60 tablet, Rfl: 1 .  tiotropium (SPIRIVA) 18 MCG inhalation capsule, Place 18 mcg into inhaler and inhale daily.  , Disp: , Rfl:  .  Magnesium Oxide 500 MG CAPS, Take 1 capsule (500 mg total) by mouth 2 (two) times daily at 8 am and 10 pm., Disp: 100 capsule, Rfl: PRN .  [START ON 10/31/2017] oxyCODONE (ROXICODONE) 15 MG immediate release tablet, Take 1 tablet (15 mg total) by mouth every 6 (six) hours as needed for pain., Disp: 120 tablet, Rfl: 0 .  [START ON 10/01/2017] oxyCODONE (ROXICODONE) 15 MG immediate release tablet, Take 1 tablet (15 mg total) by mouth every 6 (six) hours as needed for pain., Disp: 120 tablet, Rfl: 0  ROS  Constitutional: Denies any fever or chills Gastrointestinal: No reported hemesis, hematochezia, vomiting, or acute GI distress Musculoskeletal: Denies any acute onset joint swelling, redness, loss of ROM, or weakness Neurological: No reported episodes of acute onset apraxia, aphasia, dysarthria, agnosia, amnesia, paralysis, loss of coordination, or loss of consciousness  Allergies  Mr. Mcminn is allergic to lodine [etodolac]; tylenol [acetaminophen]; iodine; and propoxyphene n-acetaminophen.  [place]  Drug: Mr. Siedlecki  reports that he does not use drugs. Alcohol:  reports that he does not drink alcohol. Tobacco:  reports that he quit smoking  about 28 years ago. His smoking use included cigarettes. He started smoking about 50 years ago. He has a 28.50 pack-year smoking history. He has never used smokeless tobacco. Medical:  has a past medical history of Anxiety, Arthritis, Cellulitis (2011), Chronic back pain, Claustrophobia, COPD (chronic obstructive pulmonary disease) ([place]), Diabetes mellitus, type 2 ([place]), Diverticulitis of colon (2008), GERD (gastroesophageal reflux  disease), Heart attack ([place]) (2017), History of kidney stones, Hyperlipidemia, Hypertension, Obesity, and Pulmonary nodule. Surgical: Mr. Bressman  has a past surgical history that includes Appendectomy (2007); Tonsillectomy; Coronary artery bypass graft (N/A, 02/06/2013); and left heart catheterization with coronary angiogram (N/A, 02/03/2013). Family: family history includes Cancer in his mother; Coronary artery disease in his mother and sister; Diabetes in his sister; Heart attack in his brother and father.  Constitutional Exam  General appearance: Well nourished, well developed, and well hydrated. In no apparent acute distress Vitals:   09/23/17 0939  BP: (!) 155/88  Pulse: 77  Temp: 97.7 F (36.5 C)  SpO2: 96%  Weight: 248 lb (112.5 kg)  Height: 5' 10" (1.778 m)  Psych/Mental status: Alert, oriented x 3 (person, place, & time)       Eyes: PERLA Respiratory: No evidence of acute respiratory distress  Cervical Spine Area Exam  Skin & Axial Inspection: No masses, redness, edema, swelling, or associated skin lesions Alignment: Symmetrical Functional ROM: Unrestricted ROM      Stability: No instability detected Muscle Tone/Strength: Functionally intact. No obvious neuro-muscular anomalies detected. Sensory (Neurological): Unimpaired Palpation: No palpable anomalies              Upper Extremity (UE) Exam    Side: Right upper extremity  Side: Left upper extremity  Skin & Extremity Inspection: Skin color, temperature, and hair growth are WNL. No peripheral edema  or cyanosis. No masses, redness, swelling, asymmetry, or associated skin lesions. No contractures.  Skin & Extremity Inspection: Skin color, temperature, and hair growth are WNL. No peripheral edema or cyanosis. No masses, redness, swelling, asymmetry, or associated skin lesions. No contractures.  Functional ROM: Unrestricted ROM          Functional ROM: Unrestricted ROM          Muscle Tone/Strength: Functionally intact. No obvious neuro-muscular anomalies detected.  Muscle Tone/Strength: Functionally intact. No obvious neuro-muscular anomalies detected.  Sensory (Neurological): Unimpaired          Sensory (Neurological): Unimpaired          Palpation: No palpable anomalies              Palpation: No palpable anomalies              Provocative Test(s):  Phalen's test: deferred Tinel's test: deferred Apley's scratch test (touch opposite shoulder):  Action 1 (Across chest): deferred Action 2 (Overhead): deferred Action 3 (LB reach): deferred   Provocative Test(s):  Phalen's test: deferred Tinel's test: deferred Apley's scratch test (touch opposite shoulder):  Action 1 (Across chest): deferred Action 2 (Overhead): deferred Action 3 (LB reach): deferred    Thoracic Spine Area Exam  Skin & Axial Inspection: No masses, redness, or swelling Alignment: Symmetrical Functional ROM: Unrestricted ROM Stability: No instability detected Muscle Tone/Strength: Functionally intact. No obvious neuro-muscular anomalies detected. Sensory (Neurological): Unimpaired Muscle strength & Tone: No palpable anomalies  Lumbar Spine Area Exam  Skin & Axial Inspection: No masses, redness, or swelling Alignment: Symmetrical Functional ROM: Unrestricted ROM       Stability: No instability detected Muscle Tone/Strength: Functionally intact. No obvious neuro-muscular anomalies detected. Sensory (Neurological): Unimpaired Palpation: No palpable anomalies       Provocative Tests: Hyperextension/rotation test:  deferred today       Lumbar quadrant test (Kemp's test): deferred today       Lateral bending test: deferred today       Patrick's Maneuver: deferred today  FABER test: deferred today                   S-I anterior distraction/compression test: deferred today         S-I lateral compression test: deferred today         S-I Thigh-thrust test: deferred today         S-I Gaenslen's test: deferred today          Gait & Posture Assessment  Ambulation: Unassisted Gait: Relatively normal for age and body habitus Posture: WNL   Lower Extremity Exam    Side: Right lower extremity  Side: Left lower extremity  Stability: No instability observed          Stability: No instability observed          Skin & Extremity Inspection: Skin color, temperature, and hair growth are WNL. No peripheral edema or cyanosis. No masses, redness, swelling, asymmetry, or associated skin lesions. No contractures.  Skin & Extremity Inspection: Skin color, temperature, and hair growth are WNL. No peripheral edema or cyanosis. No masses, redness, swelling, asymmetry, or associated skin lesions. No contractures.  Functional ROM: Unrestricted ROM                  Functional ROM: Unrestricted ROM                  Muscle Tone/Strength: Functionally intact. No obvious neuro-muscular anomalies detected.  Muscle Tone/Strength: Functionally intact. No obvious neuro-muscular anomalies detected.  Sensory (Neurological): Unimpaired  Sensory (Neurological): Unimpaired  Palpation: No palpable anomalies  Palpation: No palpable anomalies   Assessment  Primary Diagnosis & Pertinent Problem List: The primary encounter diagnosis was Spondylosis of lumbar spine. Diagnoses of Chronic lumbar radicular pain (L5 dermatome) (Right), Chronic knee pain (Location of Primary Source of Pain) (Bilateral) (L>R), Chronic pain syndrome, and Long term prescription opiate use were also pertinent to this visit.  Status Diagnosis   Controlled Controlled Controlled 1. Spondylosis of lumbar spine   2. Chronic lumbar radicular pain (L5 dermatome) (Right)   3. Chronic knee pain (Location of Primary Source of Pain) (Bilateral) (L>R)   4. Chronic pain syndrome   5. Long term prescription opiate use     Problems updated and reviewed during this visit: No problems updated. Plan of Care  Pharmacotherapy (Medications Ordered): Meds ordered this encounter  Medications  . oxyCODONE (ROXICODONE) 15 MG immediate release tablet    Sig: Take 1 tablet (15 mg total) by mouth every 6 (six) hours as needed.    Dispense:  120 tablet    Refill:  0    Do not add this medication to the electronic "Automatic Refill" notification system. Patient may have prescription filled one day early if pharmacy is closed on scheduled refill date.    Order Specific Question:   Supervising Provider    Answer:   NAVEIRA, FRANCISCO [[id number]]  . oxyCODONE (ROXICODONE) 15 MG immediate release tablet    Sig: Take 1 tablet (15 mg total) by mouth every 6 (six) hours as needed for pain.    Dispense:  120 tablet    Refill:  0    Do not add this medication to the electronic "Automatic Refill" notification system. Patient may have prescription filled one day early if pharmacy is closed on scheduled refill date.    Order Specific Question:   Supervising Provider    Answer:   NAVEIRA, FRANCISCO [[id number]]  . oxyCODONE (ROXICODONE) 15 MG immediate release tablet      Sig: Take 1 tablet (15 mg total) by mouth every 6 (six) hours as needed for pain.    Dispense:  120 tablet    Refill:  0    Do not add this medication to the electronic "Automatic Refill" notification system. Patient may have prescription filled one day early if pharmacy is closed on scheduled refill date.    Order Specific Question:   Supervising Provider    Answer:   Milinda Pointer [[id number]]   New Prescriptions   No medications on file   Medications administered today: Eula Mazzola had no  medications administered during this visit. Lab-work, procedure(s), and/or referral(s): Orders Placed This Encounter  Procedures  . ToxASSURE Select 13 (MW), Urine   Imaging and/or referral(s): None  Interventional therapies: Planned, scheduled, and/or pending:  Not at this time.   Considering:  Diagnostic bilateralintra-articular knee injection Diagnostic bilateral genicular nerve block Possible bilateral genicular nerve radiofrequency ablation Diagnostic bilateral lumbar facet block Possible bilaterallumbar facet radiofrequency ablation(Left side done on 03/30/2016) Diagnostic left-sided L4-5 lumbar epidural steroid injection Diagnostic bilateral sacroiliac joint block Possible bilateral sacroiliac joint radiofrequency ablation   Palliative PRN treatment(s):  Palliativebilateral intra-articular knee injection Palliativebilateral genicular nerveblock Palliativebilateral lumbar facetblock Palliativeleft-sided L4-5 lumbar epidural steroid injection Palliativebilateral sacroiliac jointblock    Provider-requested follow-up: Return in about 3 months (around 12/23/2017) for MedMgmt with Me Donella St[facility]).  Future Appointments  Date Time Provider [place]  11/10/2017 11:40 AM Herminio Commons, MD [facility]  12/22/2017  9:30 AM Vevelyn Francois, NP [facility] None   Primary Care Physician: [facility] Location: [facility] Facility Note by: Vevelyn Francois NP Date: 09/23/2017; Time: 1:15 PM  Pain Score Disclaimer: We use the NRS-11 scale. This is a self-reported, subjective measurement of pain severity with only modest accuracy. It is used primarily to identify changes within a particular patient. It must be understood that outpatient pain scales are significantly less accurate that those used for research, where they can be applied under ideal controlled circumstances with minimal exposure to  variables. In reality, the score is likely to be a combination of pain intensity and pain affect, where pain affect describes the degree of emotional arousal or changes in action readiness caused by the sensory experience of pain. Factors such as social and work situation, setting, emotional state, anxiety levels, expectation, and prior pain experience may influence pain perception and show large inter-individual differences that may also be affected by time variables.  Patient instructions provided during this appointment: Patient Instructions  ____________________________________________________________________________________________  Medication Rules  Applies to: All patients receiving prescriptions (written or electronic).  Pharmacy of record: Pharmacy where electronic prescriptions will be sent. If written prescriptions are taken to a different pharmacy, please inform the nursing staff. The pharmacy listed in the electronic medical record should be the one where you would like electronic prescriptions to be sent.  Prescription refills: Only during scheduled appointments. Applies to both, written and electronic prescriptions.  NOTE: The following applies primarily to controlled substances (Opioid* Pain Medications).   Patient's responsibilities: 1. Pain Pills: Bring all pain pills to every appointment (except for procedure appointments). 2. Pill Bottles: Bring pills in original pharmacy bottle. Always bring newest bottle. Bring bottle, even if empty. 3. Medication refills: You are responsible for knowing and keeping track of what medications you need refilled. The day before your appointment, write a list of all prescriptions that need to be refilled. Bring that list to your appointment and  give it to the admitting nurse. Prescriptions will be written only during appointments. If you forget a medication, it will not be "Called in", "Faxed", or "electronically sent". You will need to get  another appointment to get these prescribed. 4. Prescription Accuracy: You are responsible for carefully inspecting your prescriptions before leaving our office. Have the discharge nurse carefully go over each prescription with you, before taking them home. Make sure that your name is accurately spelled, that your address is correct. Check the name and dose of your medication to make sure it is accurate. Check the number of pills, and the written instructions to make sure they are clear and accurate. Make sure that you are given enough medication to last until your next medication refill appointment. 5. Taking Medication: Take medication as prescribed. Never take more pills than instructed. Never take medication more frequently than prescribed. Taking less pills or less frequently is permitted and encouraged, when it comes to controlled substances (written prescriptions).  6. Inform other Doctors: Always inform, all of your healthcare providers, of all the medications you take. 7. Pain Medication from other Providers: You are not allowed to accept any additional pain medication from any other Doctor or Healthcare provider. There are two exceptions to this rule. (see below) In the event that you require additional pain medication, you are responsible for notifying us, as stated below. 8. Medication Agreement: You are responsible for carefully reading and following our Medication Agreement. This must be signed before receiving any prescriptions from our practice. Safely store a copy of your signed Agreement. Violations to the Agreement will result in no further prescriptions. (Additional copies of our Medication Agreement are available upon request.) 9. Laws, Rules, & Regulations: All patients are expected to follow all Federal and [employer], Rules, [employer]. Ignorance of the Laws does not constitute a valid excuse. The use of any illegal substances is prohibited. 10. Adopted CDC guidelines &  recommendations: Target dosing levels will be at or below 60 MME/day. Use of benzodiazepines** is not recommended.  Exceptions: There are only two exceptions to the rule of not receiving pain medications from other Healthcare Providers. 1. Exception #1 (Emergencies): In the event of an emergency (i.e.: accident requiring emergency care), you are allowed to receive additional pain medication. However, you are responsible for: As soon as you are able, call our office [phone], at any time of the day or night, and leave a message stating your name, the date and nature of the emergency, and the name and dose of the medication prescribed. In the event that your call is answered by a member of our staff, make sure to document and save the date, time, and the name of the person that took your information.  2. Exception #2 (Planned Surgery): In the event that you are scheduled by another doctor or dentist to have any type of surgery or procedure, you are allowed (for a period no longer than 30 days), to receive additional pain medication, for the acute post-op pain. However, in this case, you are responsible for picking up a copy of our "Post-op Pain Management for Surgeons" handout, and giving it to your surgeon or dentist. This document is available at our office, and does not require an appointment to obtain it. Simply go to our office during business hours (Monday-Thursday from 8:00 AM to 4:00 PM) (Friday 8:00 AM to 12:00 Noon) or if you have a scheduled appointment with [place], prior to your surgery, and ask for  it by name. In addition, you will need to provide [place] with your name, name of your surgeon, type of surgery, and date of procedure or surgery.  *Opioid medications include: morphine, codeine, oxycodone, oxymorphone, hydrocodone, hydromorphone, meperidine, tramadol, tapentadol, buprenorphine, fentanyl, methadone. **Benzodiazepine medications include: diazepam (Valium), alprazolam (Xanax), clonazepam  (Klonopine), lorazepam (Ativan), clorazepate (Tranxene), chlordiazepoxide (Librium), estazolam (Prosom), oxazepam (Serax), temazepam (Restoril), triazolam (Halcion) (Last updated: 03/11/2017) ____________________________________________________________________________________________

## 2017-09-23 NOTE — Progress Notes (Signed)
Nursing Pain Medication Assessment:  Safety precautions to be maintained throughout the outpatient stay will include: orient to surroundings, keep bed in low position, maintain call bell within reach at all times, provide assistance with transfer out of bed and ambulation.  Medication Inspection Compliance: Pill count conducted under aseptic conditions, in front of the patient. Neither the pills nor the bottle was removed from the patient's sight at any time. Once count was completed pills were immediately returned to the patient in their original bottle.  Medication: Oxycodone IR Pill/Patch Count: 36 of 120 pills remain Pill/Patch Appearance: Markings consistent with prescribed medication Bottle Appearance: Standard pharmacy container. Clearly labeled. Filled Date: 8 / 21 / 2019 Last Medication intake:  Today

## 2017-09-30 LAB — TOXASSURE SELECT 13 (MW), URINE

## 2017-10-21 ENCOUNTER — Ambulatory Visit (INDEPENDENT_AMBULATORY_CARE_PROVIDER_SITE_OTHER): Payer: Medicare Other | Admitting: Cardiovascular Disease

## 2017-10-21 ENCOUNTER — Encounter: Payer: Self-pay | Admitting: Cardiovascular Disease

## 2017-10-21 VITALS — BP 130/78 | HR 80 | Ht 69.0 in | Wt 267.2 lb

## 2017-10-21 DIAGNOSIS — I255 Ischemic cardiomyopathy: Secondary | ICD-10-CM | POA: Diagnosis not present

## 2017-10-21 DIAGNOSIS — R002 Palpitations: Secondary | ICD-10-CM

## 2017-10-21 DIAGNOSIS — I5022 Chronic systolic (congestive) heart failure: Secondary | ICD-10-CM

## 2017-10-21 DIAGNOSIS — I25708 Atherosclerosis of coronary artery bypass graft(s), unspecified, with other forms of angina pectoris: Secondary | ICD-10-CM | POA: Diagnosis not present

## 2017-10-21 DIAGNOSIS — I10 Essential (primary) hypertension: Secondary | ICD-10-CM

## 2017-10-21 DIAGNOSIS — E785 Hyperlipidemia, unspecified: Secondary | ICD-10-CM

## 2017-10-21 NOTE — Progress Notes (Signed)
SUBJECTIVE: The patient presents for overdue follow-up.  I last saw him in January 2017.    He called our office in early September 2019 describing sensations in his heart "like a phone ringing ".  Heart rate was 82 bpm and O2 saturations were 97%.  He has a history of diabetes, hyperlipidemia, hypertension, COPD, and CAD. He sustained a non-ST elevation myocardial infarction and ultimately underwent 3-vessel coronary artery bypass graft surgery on 02/06/13, with a LIMA to the LAD, SVG to OM1, and SVG to RCA. Left ventriculography revealed an EF of 30% and LVEDP was 20 mmHg.  Echocardiogram on 11/15/13 showed severely reduced LV systolic function, EF 09%, restrictive diastolic physiology, RV dysfunction, mild to moderate mitral regurgitation.  He has had echocardiograms ordered subsequent to this by both Dr. Lovena Le and myself and they were never pursued.  He also never followed up with subsequent cardiology appointments.  He asked that the door be left open otherwise he said he "would go crazy ".  He denies chest pains.  For the past 3 months he has noticed episodic palpitations occurring as frequently as 8-9 times per day.  He has not had any over the past week.  He is bothered by a chronic cough which has been going on for several years.  He quit smoking 25 years ago.  He has chronic back pain and left knee pain and sees pain management once every 3 months.    Review of Systems: As per "subjective", otherwise negative.  Allergies  Allergen Reactions  . Lodine [Etodolac] Swelling, Rash and Hives    Rash and swelling   . Tylenol [Acetaminophen] Hives  . Iodine Rash  . Propoxyphene N-Acetaminophen Other (See Comments)    unknown    Current Outpatient Medications  Medication Sig Dispense Refill  . albuterol (PROVENTIL) (2.5 MG/3ML) 0.083% nebulizer solution Inhale 2.5 mg into the lungs 3 (three) times daily.     Marland Kitchen alprazolam (XANAX) 2 MG tablet Take 2 mg by mouth 3 (three)  times daily.     Marland Kitchen amLODipine (NORVASC) 10 MG tablet Take 10 mg by mouth daily.    Marland Kitchen amphetamine-dextroamphetamine (ADDERALL) 30 MG tablet Take 30 mg by mouth 3 (three) times daily.     Marland Kitchen aspirin EC 81 MG tablet Take 1 tablet (81 mg total) by mouth daily.    Marland Kitchen atorvastatin (LIPITOR) 80 MG tablet Take 80 mg by mouth daily.     . Canagliflozin (INVOKANA) 100 MG TABS Take 100 mg by mouth daily.    . carvedilol (COREG) 12.5 MG tablet Take 12.5 mg by mouth 2 (two) times daily.    . Cholecalciferol (VITAMIN D3) 2000 units capsule Take 2,000 Units by mouth daily.  1  . citalopram (CELEXA) 20 MG tablet Take 20 mg by mouth daily.     . cloNIDine (CATAPRES) 0.3 MG tablet Take 0.3 mg by mouth 2 (two) times daily.  1  . fluticasone (FLONASE) 50 MCG/ACT nasal spray Place 1-2 sprays into both nostrils 2 (two) times daily as needed (for allergies.).   0  . Fluticasone-Salmeterol (ADVAIR DISKUS) 500-50 MCG/DOSE AEPB Inhale 1 puff into the lungs every 12 (twelve) hours.      . gabapentin (NEURONTIN) 300 MG capsule TAKE ONE CAPSULE BY MOUTH THREE TIMES DAILY. (TAKE WITH 600MG TABLET TO TOTAL 900MG)  1  . gabapentin (NEURONTIN) 600 MG tablet Take 600 mg by mouth 3 (three) times daily.    . insulin aspart (NOVOLOG FLEXPEN)  100 UNIT/ML FlexPen Inject 20 Units into the skin 3 (three) times daily. 20 units three times a day with sliding scale max of 26 units     . Ipratropium-Albuterol (COMBIVENT RESPIMAT) 20-100 MCG/ACT AERS respimat Inhale 1 puff into the lungs every 6 (six) hours.    Marland Kitchen LEVEMIR FLEXTOUCH 100 UNIT/ML Pen Inject 50 Units into the skin at bedtime.  1  . Magnesium Oxide 500 MG TABS Take 1 tablet by mouth 2 (two) times daily.  99  . Melatonin 10 MG TABS Take 10 mg by mouth at bedtime.    . meloxicam (MOBIC) 15 MG tablet Take 15 mg by mouth 2 (two) times daily.    . metFORMIN (GLUCOPHAGE) 1000 MG tablet Take 1,000 mg by mouth 2 (two) times daily.     . naloxone (NARCAN) 2 MG/2ML injection Inject content  of syringe into thigh muscle. Call 911. 2 Syringe 1  . naloxone (NARCAN) nasal spray 4 mg/0.1 mL One spray in either nostril once for known/suspected opioid overdose. May repeat every 2-3 minutes in alternating nostril til EMS arrives 1 kit 0  . nitroGLYCERIN (NITROSTAT) 0.4 MG SL tablet Place 1 tablet (0.4 mg total) under the tongue every 5 (five) minutes as needed for chest pain. 90 tablet 3  . [START ON 11/30/2017] oxyCODONE (ROXICODONE) 15 MG immediate release tablet Take 1 tablet (15 mg total) by mouth every 6 (six) hours as needed. 120 tablet 0  . pantoprazole (PROTONIX) 40 MG tablet Take 40 mg by mouth 2 (two) times daily.    . sacubitril-valsartan (ENTRESTO) 97-103 MG Take 1 tablet by mouth 2 (two) times daily. 60 tablet 1  . tiotropium (SPIRIVA) 18 MCG inhalation capsule Place 18 mcg into inhaler and inhale daily.      . fenofibrate 160 MG tablet Take 1 tablet by mouth daily.  0   No current facility-administered medications for this visit.     Past Medical History:  Diagnosis Date  . Anxiety   . Arthritis    RA  . Cellulitis 2011   of neck   . Chronic back pain   . Claustrophobia   . COPD (chronic obstructive pulmonary disease) ([place])   . Diabetes mellitus, type 2 ([place])   . Diverticulitis of colon 3662   complicated by abscess  . GERD (gastroesophageal reflux disease)   . Heart attack ([place]) 2017  . History of kidney stones   . Hyperlipidemia   . Hypertension   . Obesity   . Pulmonary nodule     Past Surgical History:  Procedure Laterality Date  . APPENDECTOMY  2007   gangrenous appendicitis   . CORONARY ARTERY BYPASS GRAFT N/A 02/06/2013   Procedure: CORONARY ARTERY BYPASS GRAFTING (CABG) with TEE;  Surgeon: [name], MD;  Location: [facility];  Service: Open Heart Surgery;  Laterality: N/A;  CABG x three,  using left internal mammary artery and right leg greater saphenous vein harvested endoscopically  . LEFT HEART CATHETERIZATION WITH CORONARY ANGIOGRAM N/A  02/03/2013   Procedure: LEFT HEART CATHETERIZATION WITH CORONARY ANGIOGRAM;  Surgeon: Jettie Booze, MD;  Location: [facility] CATH LAB;  Service: Cardiovascular;  Laterality: N/A;  . TONSILLECTOMY      Social History   Socioeconomic History  . Marital status: Single    Spouse name: Not on file  . Number of children: 2tw  . Years of education: Not on file  . Highest education level: Not on file  Occupational History  . Occupation: Disabled  Social Needs  . Financial resource strain: Not on file  . Food insecurity:    Worry: Not on file    Inability: Not on file  . Transportation needs:    Medical: Not on file    Non-medical: Not on file  Tobacco Use  . Smoking status: Former Smoker    Packs/day: 1.50    Years: 19.00    Pack years: 28.50    Types: Cigarettes    Start date: 03/12/1967    Last attempt to quit: 11/12/1988    Years since quitting: 28.9  . Smokeless tobacco: Never Used  Substance and Sexual Activity  . Alcohol use: No    Alcohol/week: 0.0 standard drinks    Comment:    . Drug use: No  . Sexual activity: Yes    Birth control/protection: None  Lifestyle  . Physical activity:    Days per week: Not on file    Minutes per session: Not on file  . Stress: Not on file  Relationships  . Social connections:    Talks on phone: Not on file    Gets together: Not on file    Attends religious service: Not on file    Active member of club or organization: Not on file    Attends meetings of clubs or organizations: Not on file    Relationship status: Not on file  . Intimate partner violence:    Fear of current or ex partner: Not on file    Emotionally abused: Not on file    Physically abused: Not on file    Forced sexual activity: Not on file  Other Topics Concern  . Not on file  Social History Narrative   Lives with significant other of 18 years. They are raising his grandson.     Vitals:   10/21/17 1147  BP: 130/78  Pulse: 80  Weight: 267 lb 3.2 oz (121.2  kg)  Height: _0  (1.753 m)    Wt Readings from Last 3 Encounters:  10/21/17 267 lb 3.2 oz (121.2 kg)  09/23/17 248 lb (112.5 kg)  06/23/17 240 lb (108.9 kg)     PHYSICAL EXAM General: NAD HEENT: Normal. Neck: No JVD, no thyromegaly. Lungs: No crackles or wheezes. CV: Regular rate and rhythm, normal S1/S2, no S3/S4, no murmur. No pretibial or periankle edema.  No carotid bruit.   Abdomen: Soft, nontender, obese.  Neurologic: Alert and oriented.  Psych: Normal affect. Skin: Normal. Musculoskeletal: No gross deformities.    ECG: Reviewed above under Subjective   Labs: Lab Results  Component Value Date/Time   K 4.8 02/17/2017 10:03 AM   BUN 17 02/17/2017 10:03 AM   BUN 20 06/04/2016 11:16 AM   CREATININE 0.95 02/17/2017 10:03 AM   ALT 26 06/04/2016 11:16 AM   TSH 3.348 05/22/2011 12:35 AM   HGB 14.9 02/17/2017 10:03 AM     Lipids: Lab Results  Component Value Date/Time   LDLCALC 121 (H) 03/13/2015 07:00 AM   LDLDIRECT 137 (H) 07/03/2009 03:00 PM   CHOL 183 03/13/2015 07:00 AM   TRIG 191 (H) 03/13/2015 07:00 AM   HDL 24 (L) 03/13/2015 07:00 AM       ASSESSMENT AND PLAN:  1. CAD s/p 3-vessel CABG: Symptomatically stable.  Continue ASA, atorvastatin, Entresto, and carvedilol.  2. Ischemic cardiomyopathy, EF 95%/KDTOIZT systolic heart failure: No evidence of heart failure. He has had echocardiograms ordered subsequent to this by both Dr. Lovena Le and myself and they were never pursued.  Continue  Entresto and carvedilol. I will attempt to obtain a follow-up echocardiogram to see if an ICD is indicated and further evaluation of palpitations.  3. Essential HTN: Blood pressure is normal.  No changes to therapy.  4. Hyperlipidemia: Continue high dose Lipitor 80 mg daily. I will obtain a copy of lipids from PCP.    5.  Palpitations: I will order a 2-D echocardiogram with Doppler to evaluate cardiac structure, function, and regional wall motion. I will also obtain  a 3-week event monitor.   Disposition: Follow up 3 months   Kate Sable, M.D., F.A.C.C.

## 2017-10-21 NOTE — Patient Instructions (Signed)
Medication Instructions:  Continue all current medications.  Labwork: none  Testing/Procedures:  Your physician has requested that you have an echocardiogram. Echocardiography is a painless test that uses sound waves to create images of your heart. It provides your doctor with information about the size and shape of your heart and how well your heart's chambers and valves are working. This procedure takes approximately one hour. There are no restrictions for this procedure.  Your physician has recommended that you wear a 21 day event monitor. Event monitors are medical devices that record the heart's electrical activity. Doctors most often [place] these monitors to diagnose arrhythmias. Arrhythmias are problems with the speed or rhythm of the heartbeat. The monitor is a small, portable device. You can wear one while you do your normal daily activities. This is usually used to diagnose what is causing palpitations/syncope (passing out).  Office will contact with results via phone or letter.    Follow-Up: 3 months   Any Other Special Instructions Will Be Listed Below (If Applicable).  If you need a refill on your cardiac medications before your next appointment, please call your pharmacy.

## 2017-10-26 ENCOUNTER — Encounter: Payer: Self-pay | Admitting: *Deleted

## 2017-10-27 ENCOUNTER — Encounter: Payer: Self-pay | Admitting: Cardiovascular Disease

## 2017-10-28 ENCOUNTER — Other Ambulatory Visit: Payer: Self-pay | Admitting: Cardiovascular Disease

## 2017-10-28 DIAGNOSIS — I25708 Atherosclerosis of coronary artery bypass graft(s), unspecified, with other forms of angina pectoris: Secondary | ICD-10-CM

## 2017-11-10 ENCOUNTER — Encounter

## 2017-11-10 ENCOUNTER — Ambulatory Visit: Payer: Medicare Other | Admitting: Cardiovascular Disease

## 2017-11-11 ENCOUNTER — Ambulatory Visit (INDEPENDENT_AMBULATORY_CARE_PROVIDER_SITE_OTHER): Payer: Medicare Other

## 2017-11-11 ENCOUNTER — Other Ambulatory Visit: Payer: Self-pay

## 2017-11-11 DIAGNOSIS — I25708 Atherosclerosis of coronary artery bypass graft(s), unspecified, with other forms of angina pectoris: Secondary | ICD-10-CM | POA: Diagnosis not present

## 2017-11-16 ENCOUNTER — Telehealth: Payer: Self-pay | Admitting: *Deleted

## 2017-11-16 NOTE — Telephone Encounter (Signed)
Notes recorded by Laurine Blazer, LPN on 98/06/1481 at 07:35 AM EST Patient notified. Copy to pmd. Follow up scheduled for 02/01/18 with Dr. Bronson Ing.   ------  Notes recorded by Laurine Blazer, LPN on 43/0/1484 at 03:97 AM EST Left message to return call.  ------  Notes recorded by Herminio Commons, MD on 11/15/2017 at 4:29 PM EST This study demonstrates: Cardiac function has normalized. Medication changes / Follow up studies / Other recommendations:  Does not need a defibrillator. Please send results to the PCP: [facility] [place], MD 11/15/2017 4:28 PM

## 2017-11-29 ENCOUNTER — Telehealth: Payer: Self-pay | Admitting: Nurse Practitioner

## 2017-11-29 NOTE — Telephone Encounter (Signed)
Pt called and said that his prescription needs to be filled today but the date is for tomorrow he says the reason for the early pick up is that last month had 31 days. [facility] [phone]

## 2017-11-29 NOTE — Telephone Encounter (Signed)
Called Manuel Knight to let him know that his fill date is 11/30/17 based on his records. Patient verbalizes u/o information.

## 2017-12-03 IMAGING — US US ART/VEN ABD/PELV/SCROTUM DOPPLER LTD
1 series · 13 of 25 positions shown · non-contrast
Comparison: None.

CLINICAL DATA: Left scrotal abscess.  Diabetes.

EXAM:
SCROTAL ULTRASOUND
DOPPLER ULTRASOUND OF THE TESTICLES
TECHNIQUE: Complete ultrasound examination of the testicles, epididymis, and
other scrotal structures was performed. Color and spectral Doppler
ultrasound were also utilized to evaluate blood flow to the
testicles.

[Series 1: us art/ven abd/pelv/scrotum doppler ltd · 0.09mm/px · 13 of 73 slices shown]
[im 1/73]
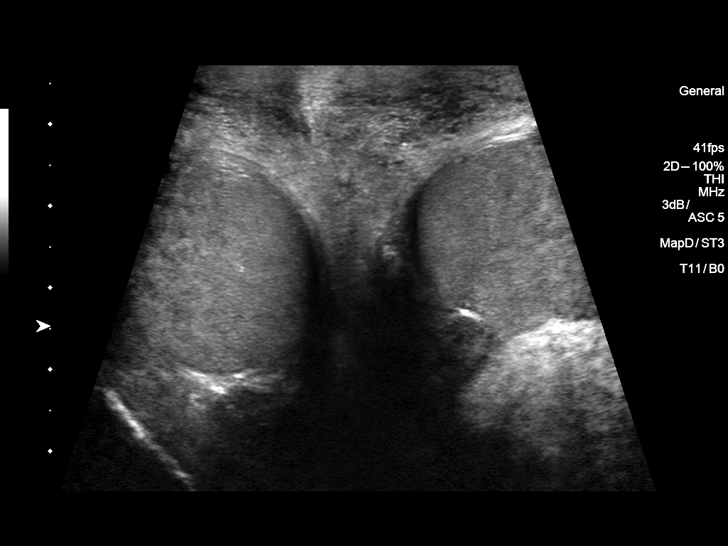
[im 7/73]
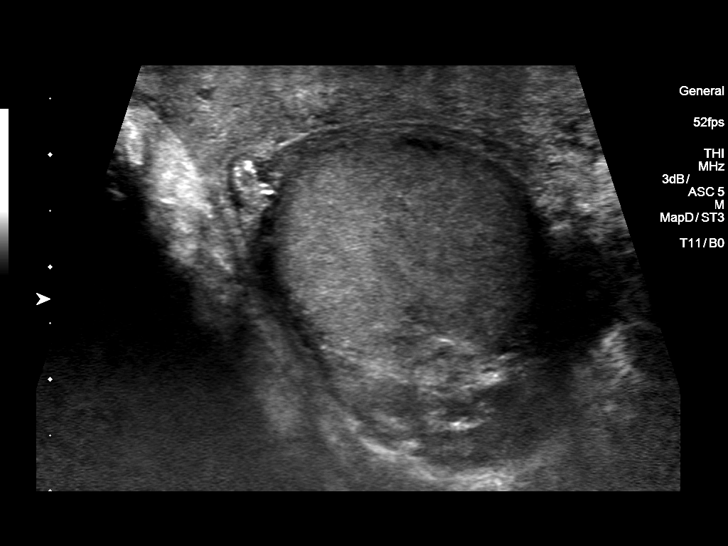
[im 13/73]
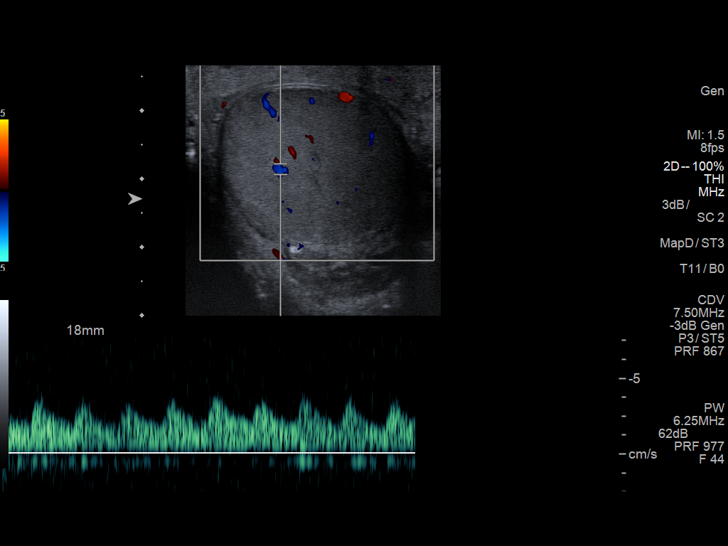
[im 19/73]
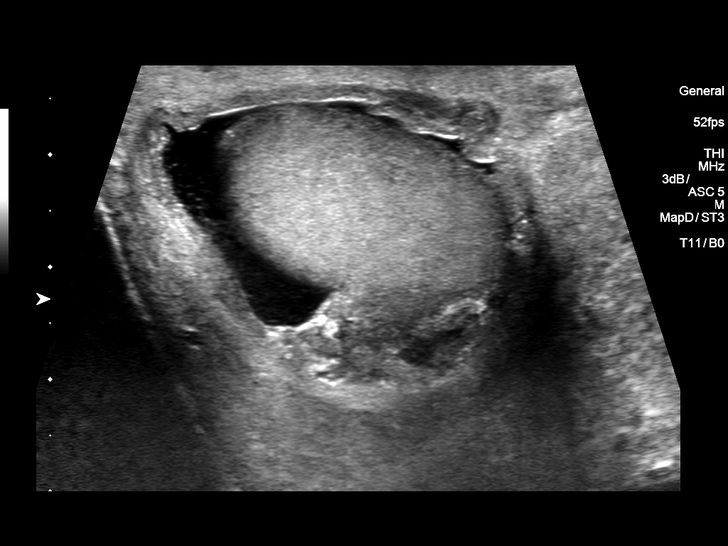
[im 25/73]
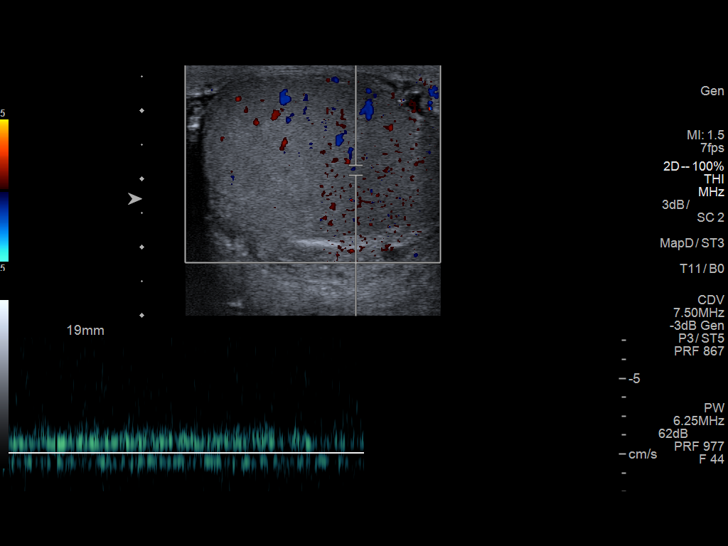
[im 31/73]
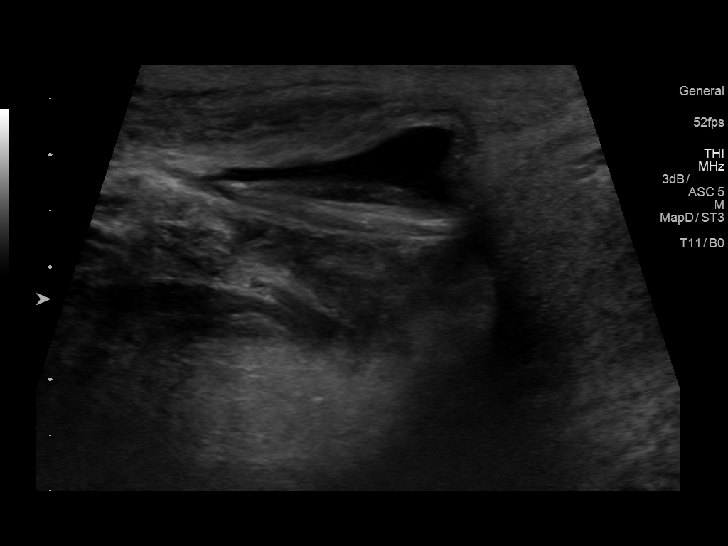
[im 37/73]
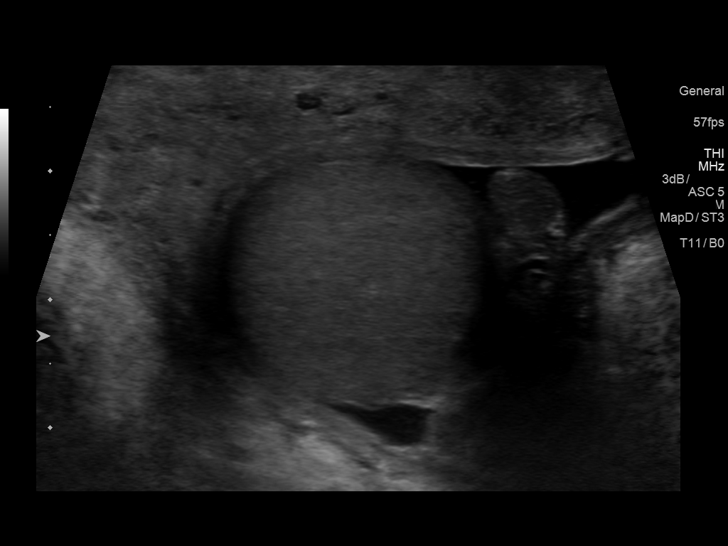
[im 43/73]
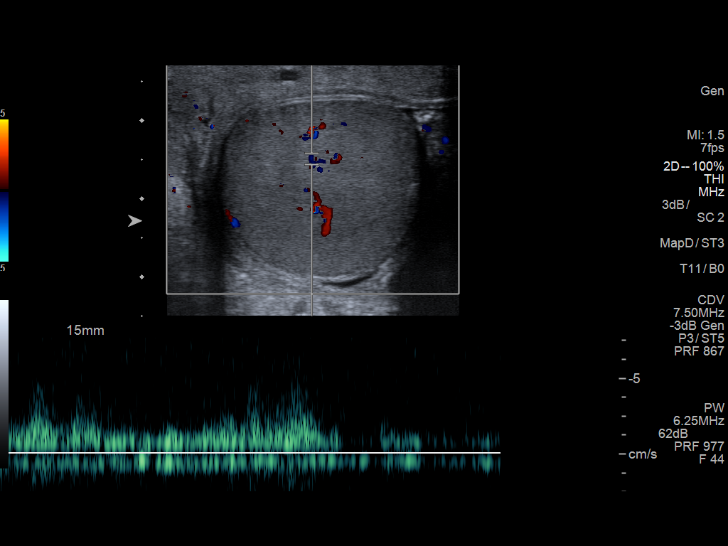
[im 49/73]
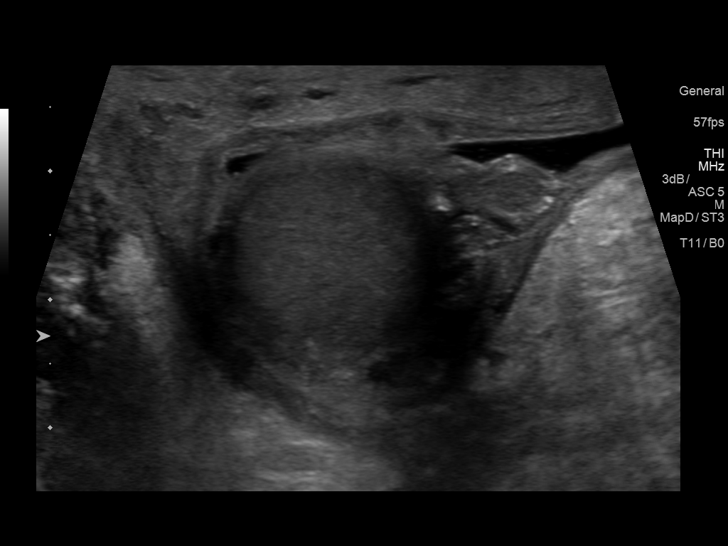
[im 55/73]
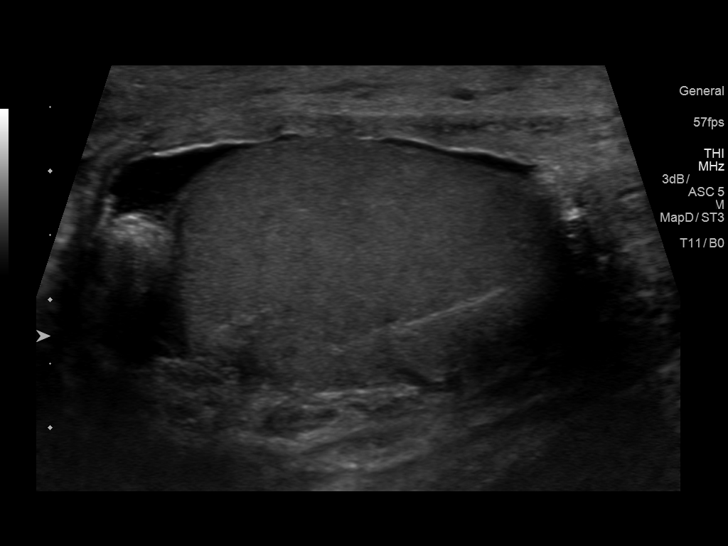
[im 61/73]
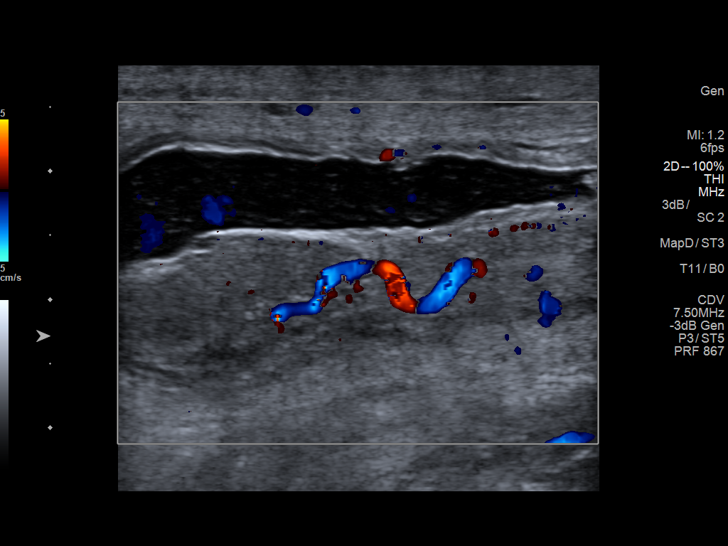
[im 67/73]
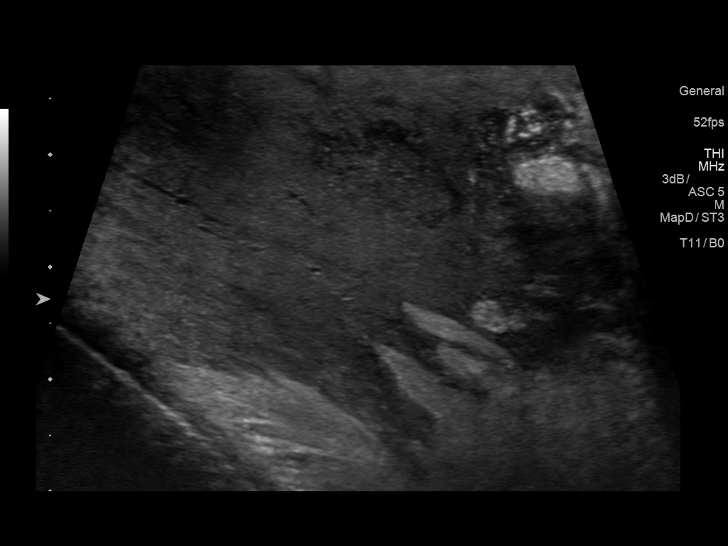
[im 73/73]
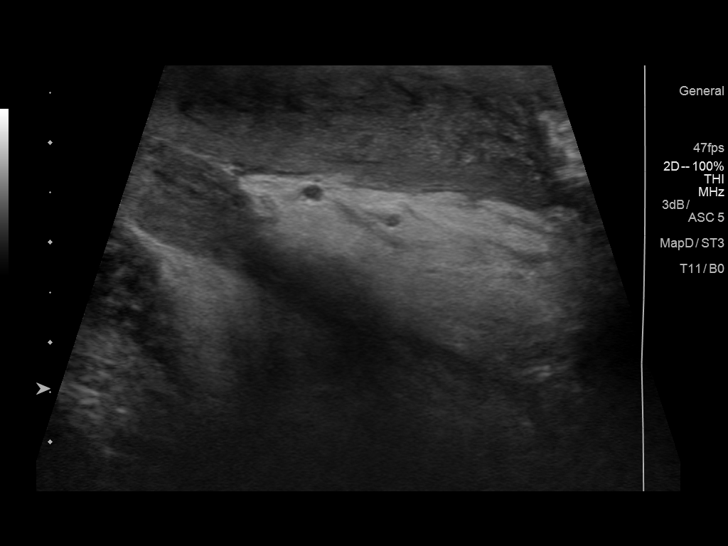

[13 of 25 positions shown; findings below may reference images not displayed]

FINDINGS: Right testicle

Measurements: 3.9 x 2.3 x 2.7 cm, within normal limits. No mass or
microlithiasis visualized.

Left testicle

Measurements: 4.1 x 2.3 x 2.6 cm, within normal limits. No mass or
microlithiasis visualized.

Right epididymis:  Normal in size and appearance.

Left epididymis:  Normal in size and appearance.

Hydrocele: Small bilateral hydroceles are present. These appear
simple.

Varicocele:  None visualized.

Pulsed Doppler interrogation of both testes demonstrates normal low
resistance arterial and venous waveforms bilaterally.

There is a complex hyperechoic area along the posterior aspect of
the scrotum, more prominent on the left. Scrotal skin thickening
measures up to 1.1 cm.
IMPRESSION: 1. Complex hyperechoic skin thickening along the posterior aspect of
the scrotum compatible with cellulitis. No discrete abscess is
evident.
2. Normal appearance of the testicles bilaterally.
3. Small bilateral hydroceles.
4. Normal pulsed Doppler interrogation within normal waveforms and
color Doppler signal.

## 2017-12-22 ENCOUNTER — Ambulatory Visit: Payer: Medicare Other | Attending: Nurse Practitioner | Admitting: Nurse Practitioner

## 2017-12-22 ENCOUNTER — Other Ambulatory Visit: Payer: Self-pay

## 2017-12-22 ENCOUNTER — Encounter: Payer: Self-pay | Admitting: Nurse Practitioner

## 2017-12-22 VITALS — BP 150/85 | HR 86 | Temp 97.7°F | Resp 20 | Ht 69.0 in | Wt 252.0 lb

## 2017-12-22 DIAGNOSIS — M47816 Spondylosis without myelopathy or radiculopathy, lumbar region: Secondary | ICD-10-CM | POA: Insufficient documentation

## 2017-12-22 DIAGNOSIS — E785 Hyperlipidemia, unspecified: Secondary | ICD-10-CM | POA: Diagnosis not present

## 2017-12-22 DIAGNOSIS — Z794 Long term (current) use of insulin: Secondary | ICD-10-CM | POA: Diagnosis not present

## 2017-12-22 DIAGNOSIS — R6510 Systemic inflammatory response syndrome (SIRS) of non-infectious origin without acute organ dysfunction: Secondary | ICD-10-CM | POA: Insufficient documentation

## 2017-12-22 DIAGNOSIS — I10 Essential (primary) hypertension: Secondary | ICD-10-CM | POA: Diagnosis not present

## 2017-12-22 DIAGNOSIS — Z79891 Long term (current) use of opiate analgesic: Secondary | ICD-10-CM | POA: Diagnosis not present

## 2017-12-22 DIAGNOSIS — Z6837 Body mass index (BMI) 37.0-37.9, adult: Secondary | ICD-10-CM | POA: Insufficient documentation

## 2017-12-22 DIAGNOSIS — I2510 Atherosclerotic heart disease of native coronary artery without angina pectoris: Secondary | ICD-10-CM | POA: Insufficient documentation

## 2017-12-22 DIAGNOSIS — Z87891 Personal history of nicotine dependence: Secondary | ICD-10-CM | POA: Insufficient documentation

## 2017-12-22 DIAGNOSIS — Z8249 Family history of ischemic heart disease and other diseases of the circulatory system: Secondary | ICD-10-CM | POA: Insufficient documentation

## 2017-12-22 DIAGNOSIS — F1290 Cannabis use, unspecified, uncomplicated: Secondary | ICD-10-CM | POA: Insufficient documentation

## 2017-12-22 DIAGNOSIS — Z5181 Encounter for therapeutic drug level monitoring: Secondary | ICD-10-CM | POA: Insufficient documentation

## 2017-12-22 DIAGNOSIS — M549 Dorsalgia, unspecified: Secondary | ICD-10-CM | POA: Diagnosis present

## 2017-12-22 DIAGNOSIS — E669 Obesity, unspecified: Secondary | ICD-10-CM | POA: Diagnosis not present

## 2017-12-22 DIAGNOSIS — K219 Gastro-esophageal reflux disease without esophagitis: Secondary | ICD-10-CM | POA: Insufficient documentation

## 2017-12-22 DIAGNOSIS — Z7982 Long term (current) use of aspirin: Secondary | ICD-10-CM | POA: Diagnosis not present

## 2017-12-22 DIAGNOSIS — M533 Sacrococcygeal disorders, not elsewhere classified: Secondary | ICD-10-CM

## 2017-12-22 DIAGNOSIS — E559 Vitamin D deficiency, unspecified: Secondary | ICD-10-CM | POA: Diagnosis not present

## 2017-12-22 DIAGNOSIS — F411 Generalized anxiety disorder: Secondary | ICD-10-CM | POA: Insufficient documentation

## 2017-12-22 DIAGNOSIS — G8929 Other chronic pain: Secondary | ICD-10-CM

## 2017-12-22 DIAGNOSIS — M5416 Radiculopathy, lumbar region: Secondary | ICD-10-CM | POA: Diagnosis not present

## 2017-12-22 DIAGNOSIS — G894 Chronic pain syndrome: Secondary | ICD-10-CM | POA: Diagnosis not present

## 2017-12-22 DIAGNOSIS — J439 Emphysema, unspecified: Secondary | ICD-10-CM | POA: Diagnosis not present

## 2017-12-22 DIAGNOSIS — Z791 Long term (current) use of non-steroidal anti-inflammatories (NSAID): Secondary | ICD-10-CM | POA: Diagnosis not present

## 2017-12-22 DIAGNOSIS — I252 Old myocardial infarction: Secondary | ICD-10-CM | POA: Diagnosis not present

## 2017-12-22 DIAGNOSIS — M170 Bilateral primary osteoarthritis of knee: Secondary | ICD-10-CM | POA: Insufficient documentation

## 2017-12-22 DIAGNOSIS — E1142 Type 2 diabetes mellitus with diabetic polyneuropathy: Secondary | ICD-10-CM | POA: Diagnosis not present

## 2017-12-22 DIAGNOSIS — E1165 Type 2 diabetes mellitus with hyperglycemia: Secondary | ICD-10-CM | POA: Insufficient documentation

## 2017-12-22 DIAGNOSIS — Z79899 Other long term (current) drug therapy: Secondary | ICD-10-CM | POA: Diagnosis not present

## 2017-12-22 DIAGNOSIS — Z951 Presence of aortocoronary bypass graft: Secondary | ICD-10-CM | POA: Diagnosis not present

## 2017-12-22 DIAGNOSIS — Z833 Family history of diabetes mellitus: Secondary | ICD-10-CM | POA: Insufficient documentation

## 2017-12-22 DIAGNOSIS — M4726 Other spondylosis with radiculopathy, lumbar region: Secondary | ICD-10-CM

## 2017-12-22 MED ORDER — OXYCODONE HCL 15 MG PO TABS: 15.0000 mg | ORAL_TABLET | Freq: Four times a day (QID) | ORAL | 0 refills | Status: DC | PRN

## 2017-12-22 MED ORDER — MAGNESIUM OXIDE -MG SUPPLEMENT 500 MG PO TABS: 1.0000 | ORAL_TABLET | Freq: Two times a day (BID) | ORAL | 0 refills | Status: DC

## 2017-12-22 NOTE — Progress Notes (Signed)
Patient's Name: Manuel Knight  MRN: [id number]  Referring Provider: [facility]  DOB: 12/12/1960  PCP: [facility]  DOS: 12/22/2017  Note by: Vevelyn Francois NP  Service setting: Ambulatory outpatient  Specialty: Interventional Pain Management  Location: [facility] (AMB) Pain Management Facility    Patient type: Established    Primary Reason(s) for Visit: Encounter for prescription drug management. (Level of risk: moderate)  CC: Back Pain (Lower Back Radiates down Left leg) and Leg Pain (Radiates down Left leg to big toe)  HPI  Mr. Delima is a 57 y.o. year old, male patient, who comes today for a medication management evaluation. He has Type 2 diabetes mellitus with hyperglycemia, with long-term current use of insulin ([place]); HLD (hyperlipidemia); Obesity, unspecified; Anxiety state; Essential hypertension; COPD with emphysema ([place]); PULMONARY NODULE; HERNIA; Abdominal pain; GERD (gastroesophageal reflux disease); Esophageal dysphagia; NSTEMI (non-ST elevated myocardial infarction) ([place]); CAD (coronary artery disease), native coronary artery; S/P CABG x 3; Chronic systolic heart failure ([place]); Emphysema of lung ([place]); Lumbar spondylosis with lumbar radiculitis (Left); H/O coronary artery bypass surgery; Encounter for pain management planning; Encounter for therapeutic drug level monitoring; Opiate use (90 MME/Day); Chronic low back pain (Location of Secondary source of pain) (Bilateral) (L>R); Chronic knee pain (Location of Primary Source of Pain) (Bilateral) (L>R); Chronic foot pain (Bilateral) (L>R); Disturbance of skin sensation; Diabetic peripheral neuropathy (HCC) (lower extremity); Chronic lumbar radicular pain (L5 dermatome) (Right); Chronic sacroiliac joint pain (Bilateral) (L>R); Osteoarthritis of knee (Bilateral) (L>R); Lumbar facet syndrome (Bilateral) (L>R); Lumbar facet arthropathy; Chronic lower extremity pain (Location of Tertiary source of pain)  (Bilateral) (R>L); History of marijuana use; Hypomagnesemia; Vitamin D insufficiency; Long term current use of opiate analgesic; Long term prescription opiate use; Chronic pain syndrome; SIRS (systemic inflammatory response syndrome) ([place]); Spondylosis of lumbar spine; and Encounter for screening colonoscopy on their problem list. His primarily concern today is the Back Pain (Lower Back Radiates down Left leg) and Leg Pain (Radiates down Left leg to big toe)  Pain Assessment: Location: Lower, Left, Right Back Radiating: down the left leg to the foot and big toe  Onset: More than a month ago Duration: Chronic pain Quality: Discomfort, Constant, Radiating, Shooting Severity: 1 /10 (subjective, self-reported pain score)  Note: Reported level is compatible with observation.                          Effect on ADL: pain can be incapacitating at times  Timing: Constant Modifying factors: medications BP: (!) 150/85  HR: 86  Mr. Wanless was last scheduled for an appointment on 11/29/2017 for medication management. During today's appointment we reviewed Mr. Chawla's chronic pain status, as well as his outpatient medication regimen. He denies any pain concerns. He admits that he was offered codeine for his cough but he refused. He wanted to make sure that this was ok. His last UDS was positive for codeine but he denies the use.   The patient  reports no history of drug use. His body mass index is 37.21 kg/m.  Further details on both, my assessment(s), as well as the proposed treatment plan, please see below.  Controlled Substance Pharmacotherapy Assessment REMS (Risk Evaluation and Mitigation Strategy)  Analgesic:Oxycodone IR 15 mg every 6 hours (60 mg/day) MME/day:90 mg/day. Janett Billow, RN  12/22/2017  9:34 AM  Sign when Signing Visit Nursing Pain Medication Assessment:  Safety precautions to be maintained throughout the outpatient stay will  include: orient to surroundings, keep bed  in low position, maintain call bell within reach at all times, provide assistance with transfer out of bed and ambulation.  Medication Inspection Compliance: Pill count conducted under aseptic conditions, in front of the patient. Neither the pills nor the bottle was removed from the patient's sight at any time. Once count was completed pills were immediately returned to the patient in their original bottle.  Medication: Oxycodone IR Pill/Patch Count: 23 of 120 pills remain Pill/Patch Appearance: Markings consistent with prescribed medication Bottle Appearance: Standard pharmacy container. Clearly labeled. Filled Date: 64 / 19 / 2019 Last Medication intake:  Today   Pharmacokinetics: Liberation and absorption (onset of action): WNL Distribution (time to peak effect): WNL Metabolism and excretion (duration of action): WNL         Pharmacodynamics: Desired effects: Analgesia: Mr. Carton reports >50% benefit. Functional ability: Patient reports that medication allows him to accomplish basic ADLs Clinically meaningful improvement in function (CMIF): Sustained CMIF goals met Perceived effectiveness: Described as relatively effective, allowing for increase in activities of daily living (ADL) Undesirable effects: Side-effects or Adverse reactions: None reported Monitoring: [place] PMP: Online review of the past 2-monthperiod conducted. Compliant with practice rules and regulations Last UDS on record: Summary  Date Value Ref Range Status  09/23/2017 FINAL  Final    Comment:    ==================================================================== TOXASSURE SELECT 13 (MW) ==================================================================== Test                             Result       Flag       Units Drug Present and Declared for Prescription Verification   Alprazolam                     162          EXPECTED   ng/mg creat   Alpha-hydroxyalprazolam        262          EXPECTED   ng/mg creat     Source of alprazolam is a scheduled prescription medication.    Alpha-hydroxyalprazolam is an expected metabolite of alprazolam.   Oxycodone                      189          EXPECTED   ng/mg creat    Sources of oxycodone include scheduled prescription medications. Drug Present not Declared for Prescription Verification   Codeine                        57           UNEXPECTED ng/mg creat    Sources of codeine include scheduled prescription medications. Drug Absent but Declared for Prescription Verification   Amphetamine                    Not Detected UNEXPECTED ng/mg creat ==================================================================== Test                      Result    Flag   Units      Ref Range   Creatinine              157              mg/dL      >=20 ==================================================================== Declared Medications:  The flagging and interpretation on  this report are based on the  following declared medications.  Unexpected results may arise from  inaccuracies in the declared medications.  **Note: The testing scope of this panel includes these medications:  Alprazolam (Xanax)  Amphetamine (Adderall)  Oxycodone  **Note: The testing scope of this panel does not include following  reported medications:  Albuterol  Albuterol (Combivent)  Amlodipine (Norvasc)  Aspirin (Aspirin 81)  Atorvastatin (Lipitor)  Canagliflozin (Invokana)  Carvedilol (Coreg)  Citalopram (Celexa)  Clonidine (Catapres)  Fluticasone (Advair)  Fluticasone (Flonase)  Gabapentin (Neurontin)  Insulin (Levemir)  Insulin (NovoLog)  Ipratropium (Combivent)  Magnesium  Melatonin  Metformin  Naloxone (Narcan)  Nitroglycerin  Pantoprazole  Sacubitril (Entresto)  Salmeterol (Advair)  Tiotropium (Spiriva)  Valsartan (Entresto)  Vitamin D3 ==================================================================== For clinical consultation, please call [phone]. ====================================================================    UDS interpretation: Unexpected findings:          Medication Assessment Form: Reviewed. Abnormalities discussed Treatment compliance: Deficiencies noted and steps taken to remind the patient of the seriousness of adequate therapy compliance Risk Assessment Profile: Aberrant behavior: See prior evaluations. None observed or detected today Comorbid factors increasing risk of overdose: See prior notes. No additional risks detected today Opioid risk tool (ORT) (Total Score): 0 Personal History of Substance Abuse (SUD-Substance use disorder):  Alcohol: Negative  Illegal Drugs: Negative  Rx Drugs: Negative  ORT Risk Level calculation: Low Risk Risk of substance use disorder (SUD): Moderate Opioid Risk Tool - 12/22/17 0921      Family History of Substance Abuse   Alcohol  Negative    Illegal Drugs  Negative    Rx Drugs  Negative      Personal History of Substance Abuse   Alcohol  Negative    Illegal Drugs  Negative    Rx Drugs  Negative      Age   Age between 35-45 years   No      History of Preadolescent Sexual Abuse   History of Preadolescent Sexual Abuse  Negative or Male      Psychological Disease   Psychological Disease  Negative    Depression  Negative      Total Score   Opioid Risk Tool Scoring  0    Opioid Risk Interpretation  Low Risk      ORT Scoring interpretation table:  Score <3 = Low Risk for SUD  Score between 4-7 = Moderate Risk for SUD  Score >8 = High Risk for Opioid Abuse   Risk Mitigation Strategies:  Patient Counseling: Covered Patient-Prescriber Agreement (PPA): Present and active  Notification to other healthcare providers: Done  Pharmacologic Plan: No change in therapy, at this time.             Laboratory Chemistry  Inflammation Markers (CRP: Acute Phase) (ESR: Chronic Phase) Lab Results  Component Value Date   CRP 5.8 (H) 06/04/2016   ESRSEDRATE 2  06/04/2016   LATICACIDVEN 1.3 03/12/2015                         Rheumatology Markers Lab Results  Component Value Date   LABURIC 5.9 02/04/2013                        Renal Function Markers Lab Results  Component Value Date   BUN 17 02/17/2017   CREATININE 0.95 02/17/2017   BCR 19 06/04/2016   GFRAA >60 02/17/2017   GFRNONAA >60 02/17/2017  Hepatic Function Markers Lab Results  Component Value Date   AST 16 06/04/2016   ALT 26 06/04/2016   ALBUMIN 4.3 06/04/2016   ALKPHOS 63 06/04/2016   HCVAB NEGATIVE 11/22/2008   LIPASE 35 11/21/2008                        Electrolytes Lab Results  Component Value Date   NA 139 02/17/2017   K 4.8 02/17/2017   CL 103 02/17/2017   CALCIUM 9.5 02/17/2017   MG 1.7 06/04/2016   PHOS 3.2 03/12/2015                        Neuropathy Markers Lab Results  Component Value Date   VITAMINB12 473 06/04/2016   HGBA1C 13.8 (H) 03/12/2015   HIV Non Reactive 03/15/2015                        CNS Tests No results found for: COLORCSF, APPEARCSF, RBCCOUNTCSF, WBCCSF, POLYSCSF, LYMPHSCSF, EOSCSF, PROTEINCSF, GLUCCSF, JCVIRUS, CSFOLI, IGGCSF                      Bone Pathology Markers Lab Results  Component Value Date   VD25OH 31 02/03/2010   25OHVITD1 31 06/04/2016   25OHVITD2 1.9 06/04/2016   25OHVITD3 29 06/04/2016                         Coagulation Parameters Lab Results  Component Value Date   INR 1.59 (H) 02/06/2013   LABPROT 18.5 (H) 02/06/2013   APTT 40 (H) 02/06/2013   PLT 267 02/17/2017   DDIMER 0.32 05/21/2011                        Cardiovascular Markers Lab Results  Component Value Date   CKTOTAL 63 05/22/2011   CKMB 2.1 05/22/2011   TROPONINI 3.91 (HH) 02/04/2013   HGB 14.9 02/17/2017   HCT 46.7 02/17/2017                         CA Markers No results found for: CEA, CA125, LABCA2                      Note: Lab results reviewed.  Recent Diagnostic Imaging Results   ECHOCARDIOGRAM COMPLETE                            *[facility]*                   [address], [address]                           [place], [place] [address]                            [phone]  ------------------------------------------------------------------- Transthoracic Echocardiography  Patient:    Jabree, Rebert MR #:       [id number] Study Date: 11/11/2017 Gender:     M Age:        70 Height:     175.3 cm Weight:     121.2 kg BSA:        2.48 m^2 Pt. Status: Room:  SONOGRAPHER  [facility]  ATTENDING    Kate Sable, MD  [place], MD  [place], MD  PERFORMING   [facility]  cc:  ------------------------------------------------------------------- LV EF: 55%  ------------------------------------------------------------------- Indications:     I25.708  ------------------------------------------------------------------- History:   PMH:   Coronary artery disease.  Chronic obstructive pulmonary disease. NSTEMI.  Risk factors:  Hypertension. Diabetes mellitus. Obese.  ------------------------------------------------------------------- Study Conclusions  - Left ventricle: The cavity size was mildly dilated. Wall   thickness was normal. The estimated ejection fraction was 55%.   There is hypokinesis of the basalinferior myocardium.   Indeterminate diastolic function. - Aortic valve: Mildly calcified annulus. Trileaflet. - Mitral valve: Mildly calcified annulus. There was trivial   regurgitation. - Tricuspid valve: There was trivial regurgitation. - Pulmonary arteries: Systolic pressure could not be accurately   estimated. - Pericardium, extracardiac: A prominent pericardial fat pad was   present.  ------------------------------------------------------------------- Labs, prior tests, procedures, and surgery: Echocardiography (November 2015).     EF was 35%.  Coronary artery bypass  grafting.  ------------------------------------------------------------------- Study data:  Comparison was made to the study of November 2015. Study status:  Routine.  Procedure:  The patient reported no pain pre or post test. Transthoracic echocardiography. Image quality was adequate.  Study completion:  There were no complications. Transthoracic echocardiography.  M-mode, complete 2D, spectral Doppler, and color Doppler.  Birthdate:  Patient birthdate: 1960/10/19.  Age:  Patient is 57 yr old.  Sex:  Gender: male. BMI: 39.4 kg/m^2.  Blood pressure:     130/78  Patient status: Outpatient.  Study date:  Study date: 11/11/2017. Study time: 10:47 AM.  -------------------------------------------------------------------  ------------------------------------------------------------------- Left ventricle:  The cavity size was mildly dilated. Wall thickness was normal. The estimated ejection fraction was 55%.  Regional wall motion abnormalities:   There is hypokinesis of the basalinferior myocardium. Indeterminate diastolic function.  ------------------------------------------------------------------- Aortic valve:   Mildly calcified annulus. Trileaflet.  Doppler: There was no significant regurgitation.  ------------------------------------------------------------------- Aorta:  Aortic root: The aortic root was normal in size.  ------------------------------------------------------------------- Mitral valve:   Mildly calcified annulus.  Doppler:  There was trivial regurgitation.  ------------------------------------------------------------------- Left atrium:  The atrium was normal in size.  ------------------------------------------------------------------- Right ventricle:  The cavity size was normal. Systolic function was normal.  ------------------------------------------------------------------- Pulmonic valve:    The valve appears to be grossly normal. Doppler:  There was no  significant regurgitation.  ------------------------------------------------------------------- Tricuspid valve:   The valve appears to be grossly normal. Doppler:  There was trivial regurgitation.  ------------------------------------------------------------------- Pulmonary artery:    Systolic pressure could not be accurately estimated.  ------------------------------------------------------------------- Right atrium:  The atrium was normal in size.  ------------------------------------------------------------------- Pericardium:  A prominent pericardial fat pad was present.  ------------------------------------------------------------------- Systemic veins: Inferior vena cava: Not visualized.  ------------------------------------------------------------------- Measurements   Left ventricle                             Value        Reference  LV ID, ED, PLAX chordal            (H)     62.4  mm     43 - 52  LV ID, ES, PLAX chordal            (H)     43.7  mm     23 - 38  LV fx shortening, PLAX chordal  30    %      >=29  LV PW thickness, ED                        10.6  mm     ----------  IVS/LV PW ratio, ED                        0.82         <=1.3  Stroke volume, 2D                          60    ml     ----------  Stroke volume/bsa, 2D                      24    ml/m^2 ----------  LV ejection fraction, 1-p A4C              59    %      ----------  LV end-diastolic volume, 2-p               132   ml     ----------  LV end-systolic volume, 2-p                58    ml     ----------  LV ejection fraction, 2-p                  56    %      ----------  Stroke volume, 2-p                         74    ml     ----------  LV end-diastolic volume/bsa, 2-p           53    ml/m^2 ----------  LV end-systolic volume/bsa, 2-p            24    ml/m^2 ----------  Stroke volume/bsa, 2-p                     29.6  ml/m^2 ----------  LV e&', lateral                             10.3   cm/s   ----------  LV E/e&', lateral                           6.78         ----------  LV e&', medial                              5.03  cm/s   ----------  LV E/e&', medial                            13.88        ----------  LV e&', average                             7.67  cm/s   ----------  LV E/e&', average  9.11         ----------    Ventricular septum                         Value        Reference  IVS thickness, ED                          8.74  mm     ----------    LVOT                                       Value        Reference  LVOT ID, S                                 21    mm     ----------  LVOT area                                  3.46  cm^2   ----------  LVOT peak velocity, S                      84.4  cm/s   ----------  LVOT mean velocity, S                      60.1  cm/s   ----------  LVOT VTI, S                                17.2  cm     ----------    Aorta                                      Value        Reference  Aortic root ID, ED                         40    mm     ----------    Left atrium                                Value        Reference  LA ID, A-P, ES                             35    mm     ----------  LA ID/bsa, A-P                             1.41  cm/m^2 <=2.2  LA volume, S                               61.9  ml     ----------  LA volume/bsa, S  24.9  ml/m^2 ----------  LA volume, ES, 1-p A4C                     55.3  ml     ----------  LA volume/bsa, ES, 1-p A4C                 22.3  ml/m^2 ----------  LA volume, ES, 1-p A2C                     62.9  ml     ----------  LA volume/bsa, ES, 1-p A2C                 25.3  ml/m^2 ----------    Mitral valve                               Value        Reference  Mitral E-wave peak velocity                69.8  cm/s   ----------  Mitral A-wave peak velocity                83.9  cm/s   ----------  Mitral deceleration time           (H)     298   ms      150 - 230  Mitral E/A ratio, peak                     0.8          ----------  Mitral maximal regurg velocity,            244   cm/s   ----------  PISA    Tricuspid valve                            Value        Reference  Tricuspid regurg peak velocity             100   cm/s   ----------  Tricuspid peak RV-RA gradient              4     mm Hg  ----------    Right atrium                               Value        Reference  RA ID, S-I, ES, A4C                        48.1  mm     34 - 49  RA area, ES, A4C                           14    cm^2   8.3 - 19.5  RA volume, ES, A/L                         33.3  ml     ----------  RA volume/bsa, ES, A/L                     13.4  ml/m^2 ----------    Right ventricle  Value        Reference  TAPSE                                      17.4  mm     ----------  RV s&', lateral, S                          10.3  cm/s   ----------  Legend: (L)  and  (H)  mark values outside specified reference range.  ------------------------------------------------------------------- Prepared and Electronically Authenticated by  Rozann Lesches, M.D. 2019-10-31T15:25:23  Complexity Note: Imaging results reviewed. Results shared with Mr. Batta, using Layman's terms.                         Meds   Current Outpatient Medications:  .  albuterol (PROVENTIL) (2.5 MG/3ML) 0.083% nebulizer solution, Inhale 2.5 mg into the lungs 3 (three) times daily. , Disp: , Rfl:  .  alprazolam (XANAX) 2 MG tablet, Take 2 mg by mouth 3 (three) times daily. , Disp: , Rfl:  .  amLODipine (NORVASC) 10 MG tablet, Take 10 mg by mouth daily., Disp: , Rfl:  .  amphetamine-dextroamphetamine (ADDERALL) 30 MG tablet, Take 30 mg by mouth 3 (three) times daily. , Disp: , Rfl:  .  aspirin EC 81 MG tablet, Take 1 tablet (81 mg total) by mouth daily., Disp: , Rfl:  .  atorvastatin (LIPITOR) 80 MG tablet, Take 80 mg by mouth daily. , Disp: , Rfl:  .  Canagliflozin  (INVOKANA) 100 MG TABS, Take 100 mg by mouth daily., Disp: , Rfl:  .  carvedilol (COREG) 12.5 MG tablet, Take 12.5 mg by mouth 2 (two) times daily., Disp: , Rfl:  .  Cholecalciferol (VITAMIN D3) 2000 units capsule, Take 2,000 Units by mouth daily., Disp: , Rfl: 1 .  citalopram (CELEXA) 20 MG tablet, Take 20 mg by mouth daily. , Disp: , Rfl:  .  cloNIDine (CATAPRES) 0.3 MG tablet, Take 0.3 mg by mouth 2 (two) times daily., Disp: , Rfl: 1 .  fenofibrate 160 MG tablet, Take 1 tablet by mouth daily., Disp: , Rfl: 0 .  fluticasone (FLONASE) 50 MCG/ACT nasal spray, Place 1-2 sprays into both nostrils 2 (two) times daily as needed (for allergies.). , Disp: , Rfl: 0 .  Fluticasone-Salmeterol (ADVAIR DISKUS) 500-50 MCG/DOSE AEPB, Inhale 1 puff into the lungs every 12 (twelve) hours.  , Disp: , Rfl:  .  gabapentin (NEURONTIN) 300 MG capsule, TAKE ONE CAPSULE BY MOUTH THREE TIMES DAILY. (TAKE WITH 600MG TABLET TO TOTAL 900MG), Disp: , Rfl: 1 .  gabapentin (NEURONTIN) 600 MG tablet, Take 600 mg by mouth 3 (three) times daily., Disp: , Rfl:  .  insulin aspart (NOVOLOG FLEXPEN) 100 UNIT/ML FlexPen, Inject 20 Units into the skin 3 (three) times daily. 20 units three times a day with sliding scale max of 26 units , Disp: , Rfl:  .  Ipratropium-Albuterol (COMBIVENT RESPIMAT) 20-100 MCG/ACT AERS respimat, Inhale 1 puff into the lungs every 6 (six) hours., Disp: , Rfl:  .  LEVEMIR FLEXTOUCH 100 UNIT/ML Pen, Inject 50 Units into the skin at bedtime., Disp: , Rfl: 1 .  Magnesium Oxide 500 MG TABS, Take 1 tablet (500 mg total) by mouth 2 (two) times daily., Disp: 180 tablet, Rfl: 0 .  Melatonin 10 MG TABS,  Take 10 mg by mouth at bedtime., Disp: , Rfl:  .  meloxicam (MOBIC) 15 MG tablet, Take 15 mg by mouth 2 (two) times daily., Disp: , Rfl:  .  metFORMIN (GLUCOPHAGE) 1000 MG tablet, Take 1,000 mg by mouth 2 (two) times daily. , Disp: , Rfl:  .  naloxone (NARCAN) 2 MG/2ML injection, Inject content of syringe into thigh  muscle. Call 911., Disp: 2 Syringe, Rfl: 1 .  naloxone (NARCAN) nasal spray 4 mg/0.1 mL, One spray in either nostril once for known/suspected opioid overdose. May repeat every 2-3 minutes in alternating nostril til EMS arrives, Disp: 1 kit, Rfl: 0 .  nitroGLYCERIN (NITROSTAT) 0.4 MG SL tablet, Place 1 tablet (0.4 mg total) under the tongue every 5 (five) minutes as needed for chest pain., Disp: 90 tablet, Rfl: 3 .  [START ON 02/28/2018] oxyCODONE (ROXICODONE) 15 MG immediate release tablet, Take 1 tablet (15 mg total) by mouth every 6 (six) hours as needed., Disp: 120 tablet, Rfl: 0 .  pantoprazole (PROTONIX) 40 MG tablet, Take 40 mg by mouth 2 (two) times daily., Disp: , Rfl:  .  sacubitril-valsartan (ENTRESTO) 97-103 MG, Take 1 tablet by mouth 2 (two) times daily., Disp: 60 tablet, Rfl: 1 .  tiotropium (SPIRIVA) 18 MCG inhalation capsule, Place 18 mcg into inhaler and inhale daily.  , Disp: , Rfl:  .  [START ON 01/29/2018] oxyCODONE (ROXICODONE) 15 MG immediate release tablet, Take 1 tablet (15 mg total) by mouth every 6 (six) hours as needed for pain., Disp: 120 tablet, Rfl: 0 .  [START ON 12/30/2017] oxyCODONE (ROXICODONE) 15 MG immediate release tablet, Take 1 tablet (15 mg total) by mouth every 6 (six) hours as needed for pain., Disp: 120 tablet, Rfl: 0  ROS  Constitutional: Denies any fever or chills Gastrointestinal: No reported hemesis, hematochezia, vomiting, or acute GI distress Musculoskeletal: Denies any acute onset joint swelling, redness, loss of ROM, or weakness Neurological: No reported episodes of acute onset apraxia, aphasia, dysarthria, agnosia, amnesia, paralysis, loss of coordination, or loss of consciousness  Allergies  Mr. Muhl is allergic to lodine [etodolac]; tylenol [acetaminophen]; iodine; and propoxyphene n-acetaminophen.  [place]  Drug: Mr. Westrup  reports no history of drug use. Alcohol:  reports no history of alcohol use. Tobacco:  reports that he quit smoking  about 29 years ago. His smoking use included cigarettes. He started smoking about 50 years ago. He has a 28.50 pack-year smoking history. He has never used smokeless tobacco. Medical:  has a past medical history of Anxiety, Arthritis, Cellulitis (2011), Chronic back pain, Claustrophobia, COPD (chronic obstructive pulmonary disease) ([place]), Diabetes mellitus, type 2 ([place]), Diverticulitis of colon (2008), GERD (gastroesophageal reflux disease), Heart attack ([place]) (2017), History of kidney stones, Hyperlipidemia, Hypertension, Obesity, and Pulmonary nodule. Surgical: Mr. 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 Family: family history includes Cancer in his mother; Coronary artery disease in his mother and sister; Diabetes in his sister; Heart attack in his brother and father.  Constitutional Exam  General appearance: Well nourished, well developed, and well hydrated. In no apparent acute distress Vitals:   12/22/17 0906  BP: (!) 150/85  Pulse: 86  Resp: 20  Temp: 97.7 F (36.5 C)  TempSrc: Oral  SpO2: 96%  Weight: 252 lb (114.3 kg)  Height: '5\' 9"'  (1.753 m)  Psych/Mental status: Alert, oriented x 3 (person, place, & time)  Eyes: PERLA Respiratory: No evidence of acute respiratory distress  Cervical Spine Area Exam  Skin & Axial Inspection: No masses, redness, edema, swelling, or associated skin lesions Alignment: Symmetrical Functional ROM: Unrestricted ROM      Stability: No instability detected Muscle Tone/Strength: Functionally intact. No obvious neuro-muscular anomalies detected. Sensory (Neurological): Unimpaired Palpation: No palpable anomalies              Upper Extremity (UE) Exam    Side: Right upper extremity  Side: Left upper extremity  Skin & Extremity Inspection: Skin color, temperature, and hair growth are  WNL. No peripheral edema or cyanosis. No masses, redness, swelling, asymmetry, or associated skin lesions. No contractures.  Skin & Extremity Inspection: Skin color, temperature, and hair growth are WNL. No peripheral edema or cyanosis. No masses, redness, swelling, asymmetry, or associated skin lesions. No contractures.  Functional ROM: Unrestricted ROM          Functional ROM: Unrestricted ROM          Muscle Tone/Strength: Functionally intact. No obvious neuro-muscular anomalies detected.  Muscle Tone/Strength: Functionally intact. No obvious neuro-muscular anomalies detected.  Sensory (Neurological): Unimpaired          Sensory (Neurological): Unimpaired          Palpation: No palpable anomalies              Palpation: No palpable anomalies              Provocative Test(s):  Phalen's test: deferred Tinel's test: deferred Apley's scratch test (touch opposite shoulder):  Action 1 (Across chest): deferred Action 2 (Overhead): deferred Action 3 (LB reach): deferred   Provocative Test(s):  Phalen's test: deferred Tinel's test: deferred Apley's scratch test (touch opposite shoulder):  Action 1 (Across chest): deferred Action 2 (Overhead): deferred Action 3 (LB reach): deferred    Thoracic Spine Area Exam  Skin & Axial Inspection: No masses, redness, or swelling Alignment: Symmetrical Functional ROM: Unrestricted ROM Stability: No instability detected Muscle Tone/Strength: Functionally intact. No obvious neuro-muscular anomalies detected. Sensory (Neurological): Unimpaired Muscle strength & Tone: No palpable anomalies  Lumbar Spine Area Exam  Skin & Axial Inspection: No masses, redness, or swelling Alignment: Symmetrical Functional ROM: Unrestricted ROM       Stability: No instability detected Muscle Tone/Strength: Functionally intact. No obvious neuro-muscular anomalies detected. Sensory (Neurological): Unimpaired Palpation: No palpable anomalies       Provocative  Tests: Hyperextension/rotation test: deferred today       Lumbar quadrant test (Kemp's test): deferred today       Lateral bending test: deferred today       Patrick's Maneuver: deferred today                   FABER* test: deferred today                   S-I anterior distraction/compression test: deferred today         S-I lateral compression test: deferred today         S-I Thigh-thrust test: deferred today         S-I Gaenslen's test: deferred today         *(Flexion, ABduction and External Rotation)  Gait & Posture Assessment  Ambulation: Unassisted Gait: Relatively normal for age and body habitus Posture: WNL   Lower Extremity Exam    Side: Right lower extremity  Side: Left lower extremity  Stability: No instability observed  Stability: No instability observed          Skin & Extremity Inspection: Skin color, temperature, and hair growth are WNL. No peripheral edema or cyanosis. No masses, redness, swelling, asymmetry, or associated skin lesions. No contractures.  Skin & Extremity Inspection: Skin color, temperature, and hair growth are WNL. No peripheral edema or cyanosis. No masses, redness, swelling, asymmetry, or associated skin lesions. No contractures.  Functional ROM: Unrestricted ROM                  Functional ROM: Unrestricted ROM                  Muscle Tone/Strength: Functionally intact. No obvious neuro-muscular anomalies detected.  Muscle Tone/Strength: Functionally intact. No obvious neuro-muscular anomalies detected.  Sensory (Neurological): Unimpaired        Sensory (Neurological): Unimpaired        DTR: Patellar: deferred today Achilles: deferred today Plantar: deferred today  DTR: Patellar: deferred today Achilles: deferred today Plantar: deferred today  Palpation: No palpable anomalies  Palpation: No palpable anomalies   Assessment  Primary Diagnosis & Pertinent Problem List: The primary encounter diagnosis was Lumbar spondylosis with lumbar  radiculitis (Left). Diagnoses of Diabetic peripheral neuropathy (HCC) (lower extremity), Chronic sacroiliac joint pain (Bilateral) (L>R), and Chronic pain syndrome were also pertinent to this visit.  Status Diagnosis  Controlled Controlled Controlled 1. Lumbar spondylosis with lumbar radiculitis (Left)   2. Diabetic peripheral neuropathy (HCC) (lower extremity)   3. Chronic sacroiliac joint pain (Bilateral) (L>R)   4. Chronic pain syndrome     Problems updated and reviewed during this visit: No problems updated. Plan of Care  Pharmacotherapy (Medications Ordered): Meds ordered this encounter  Medications  . oxyCODONE (ROXICODONE) 15 MG immediate release tablet    Sig: Take 1 tablet (15 mg total) by mouth every 6 (six) hours as needed.    Dispense:  120 tablet    Refill:  0    Do not add this medication to the electronic "Automatic Refill" notification system. Patient may have prescription filled one day early if pharmacy is closed on scheduled refill date.    Order Specific Question:   Supervising Provider    Answer:   Milinda Pointer [phone]  . oxyCODONE (ROXICODONE) 15 MG immediate release tablet    Sig: Take 1 tablet (15 mg total) by mouth every 6 (six) hours as needed for pain.    Dispense:  120 tablet    Refill:  0    Do not add this medication to the electronic "Automatic Refill" notification system. Patient may have prescription filled one day early if pharmacy is closed on scheduled refill date.    Order Specific Question:   Supervising Provider    Answer:   Milinda Pointer [phone]  . oxyCODONE (ROXICODONE) 15 MG immediate release tablet    Sig: Take 1 tablet (15 mg total) by mouth every 6 (six) hours as needed for pain.    Dispense:  120 tablet    Refill:  0    Do not add this medication to the electronic "Automatic Refill" notification system. Patient may have prescription filled one day early if pharmacy is closed on scheduled refill date.    Order Specific  Question:   Supervising Provider    Answer:   Milinda Pointer [phone]  . Magnesium Oxide 500 MG TABS    Sig: Take 1 tablet (500 mg total) by mouth 2 (two) times daily.    Dispense:  180 tablet  Refill:  0    Order Specific Question:   Supervising Provider    Answer:   Milinda Pointer [phone]   New Prescriptions   No medications on file   Medications administered today: Jashaun Penrose had no medications administered during this visit. Lab-work, procedure(s), and/or referral(s): No orders of the defined types were placed in this encounter.  Imaging and/or referral(s): None  Interventional therapies: Planned, scheduled, and/or pending:  Not at this time.   Considering:  Diagnostic bilateralintra-articular knee injection Diagnostic bilateral genicular nerve block Possible bilateral genicular nerve radiofrequency ablation Diagnostic bilateral lumbar facet block Possible bilaterallumbar facet radiofrequency ablation(Left side done on 03/30/2016) Diagnostic left-sided L4-5 lumbar epidural steroid injection Diagnostic bilateral sacroiliac joint block Possible bilateral sacroiliac joint radiofrequency ablation   Palliative PRN treatment(s):  Palliativebilateral intra-articular knee injection Palliativebilateral genicular nerveblock Palliativebilateral lumbar facetblock Palliativeleft-sided L4-5 lumbar epidural steroid injection Palliativebilateral sacroiliac jointblock    Provider-requested follow-up: No follow-ups on file.  Future Appointments  Date Time Provider [place]  02/01/2018  9:00 AM Herminio Commons, MD [facility]  03/15/2018  9:30 AM Vevelyn Francois, NP [facility] None   Primary Care Physician: [facility] Location: [facility] Out[facility] Facility Note by: Vevelyn Francois NP Date: 12/22/2017; Time: 9:44 PM  Pain Score Disclaimer: We use the NRS-11 scale. This is a  self-reported, subjective measurement of pain severity with only modest accuracy. It is used primarily to identify changes within a particular patient. It must be understood that outpatient pain scales are significantly less accurate that those used for research, where they can be applied under ideal controlled circumstances with minimal exposure to variables. In reality, the score is likely to be a combination of pain intensity and pain affect, where pain affect describes the degree of emotional arousal or changes in action readiness caused by the sensory experience of pain. Factors such as social and work situation, setting, emotional state, anxiety levels, expectation, and prior pain experience may influence pain perception and show large inter-individual differences that may also be affected by time variables.  Patient instructions provided during this appointment: Patient Instructions   ____________________________________________________________________________________________  Medication Rules  Purpose: To inform patients, and their family members, of our rules and regulations.  Applies to: All patients receiving prescriptions (written or electronic).  Pharmacy of record: Pharmacy where electronic prescriptions will be sent. If written prescriptions are taken to a different pharmacy, please inform the nursing staff. The pharmacy listed in the electronic medical record should be the one where you would like electronic prescriptions to be sent.  Electronic prescriptions: In compliance with the [place] (STOP) Act of 2017 (Session Lanny Cramp [phone]), effective January 12, 2018, all controlled substances must be electronically prescribed. Calling prescriptions to the pharmacy will cease to exist.  Prescription refills: Only during scheduled appointments. Applies to all prescriptions.  NOTE: The following applies primarily to controlled substances (Opioid*  Pain Medications).   Patient's responsibilities: 1. Pain Pills: Bring all pain pills to every appointment (except for procedure appointments). 2. Pill Bottles: Bring pills in original pharmacy bottle. Always bring the newest bottle. Bring bottle, even if empty. 3. Medication refills: You are responsible for knowing and keeping track of what medications you take and those you need refilled. The day before your appointment: write a list of all prescriptions that need to be refilled. The day of the appointment: give the list to the admitting nurse. Prescriptions will be written only during appointments.  If you forget a medication: it will not be "Called in", "Faxed", or "electronically sent". You will need to get another appointment to get these prescribed. No early refills. Do not call asking to have your prescription filled early. 4. Prescription Accuracy: You are responsible for carefully inspecting your prescriptions before leaving our office. Have the discharge nurse carefully go over each prescription with you, before taking them home. Make sure that your name is accurately spelled, that your address is correct. Check the name and dose of your medication to make sure it is accurate. Check the number of pills, and the written instructions to make sure they are clear and accurate. Make sure that you are given enough medication to last until your next medication refill appointment. 5. Taking Medication: Take medication as prescribed. When it comes to controlled substances, taking less pills or less frequently than prescribed is permitted and encouraged. Never take more pills than instructed. Never take medication more frequently than prescribed.  6. Inform other Doctors: Always inform, all of your healthcare providers, of all the medications you take. 7. Pain Medication from other Providers: You are not allowed to accept any additional pain medication from any other Doctor or Healthcare provider.  There are two exceptions to this rule. (see below) In the event that you require additional pain medication, you are responsible for notifying us, as stated below. 8. Medication Agreement: You are responsible for carefully reading and following our Medication Agreement. This must be signed before receiving any prescriptions from our practice. Safely store a copy of your signed Agreement. Violations to the Agreement will result in no further prescriptions. (Additional copies of our Medication Agreement are available upon request.) 9. Laws, Rules, & Regulations: All patients are expected to follow all Federal and [employer], [employer], Rules, [employer]. Ignorance of the Laws does not constitute a valid excuse. The use of any illegal substances is prohibited. 10. Adopted CDC guidelines & recommendations: Target dosing levels will be at or below 60 MME/day. Use of benzodiazepines** is not recommended.  Exceptions: There are only two exceptions to the rule of not receiving pain medications from other Healthcare Providers. 1. Exception #1 (Emergencies): In the event of an emergency (i.e.: accident requiring emergency care), you are allowed to receive additional pain medication. However, you are responsible for: As soon as you are able, call our office [phone], at any time of the day or night, and leave a message stating your name, the date and nature of the emergency, and the name and dose of the medication prescribed. In the event that your call is answered by a member of our staff, make sure to document and save the date, time, and the name of the person that took your information.  2. Exception #2 (Planned Surgery): In the event that you are scheduled by another doctor or dentist to have any type of surgery or procedure, you are allowed (for a period no longer than 30 days), to receive additional pain medication, for the acute post-op pain. However, in this case, you are responsible for picking up a  copy of our "Post-op Pain Management for Surgeons" handout, and giving it to your surgeon or dentist. This document is available at our office, and does not require an appointment to obtain it. Simply go to our office during business hours (Monday-Thursday from 8:00 AM to 4:00 PM) (Friday 8:00 AM to 12:00 Noon) or if you have a scheduled appointment with [place], prior to your surgery, and ask for  it by name. In addition, you will need to provide [place] with your name, name of your surgeon, type of surgery, and date of procedure or surgery.  *Opioid medications include: morphine, codeine, oxycodone, oxymorphone, hydrocodone, hydromorphone, meperidine, tramadol, tapentadol, buprenorphine, fentanyl, methadone. **Benzodiazepine medications include: diazepam (Valium), alprazolam (Xanax), clonazepam (Klonopine), lorazepam (Ativan), clorazepate (Tranxene), chlordiazepoxide (Librium), estazolam (Prosom), oxazepam (Serax), temazepam (Restoril), triazolam (Halcion) (Last updated: 03/11/2017) ____________________________________________________________________________________________   BMI Assessment: Estimated body mass index is 37.21 kg/m as calculated from the following:   Height as of this encounter: '5\' 9"'  (1.753 m).   Weight as of this encounter: 252 lb (114.3 kg).  BMI interpretation table: BMI level Category Range association with higher incidence of chronic pain  <18 kg/m2 Underweight   18.5-24.9 kg/m2 Ideal body weight   25-29.9 kg/m2 Overweight Increased incidence by 20%  30-34.9 kg/m2 Obese (Class I) Increased incidence by 68%  35-39.9 kg/m2 Severe obesity (Class II) Increased incidence by 136%  >40 kg/m2 Extreme obesity (Class III) Increased incidence by 254%   Patient's current BMI Ideal Body weight  Body mass index is 37.21 kg/m. Ideal body weight: 70.7 kg (155 lb 13.8 oz) Adjusted ideal body weight: 88.1 kg (194 lb 5.1 oz)   BMI Readings from Last 4 Encounters:  12/22/17 37.21 kg/m   10/21/17 39.46 kg/m  09/23/17 35.58 kg/m  06/23/17 34.93 kg/m   Wt Readings from Last 4 Encounters:  12/22/17 252 lb (114.3 kg)  10/21/17 267 lb 3.2 oz (121.2 kg)  09/23/17 248 lb (112.5 kg)  06/23/17 240 lb (108.9 kg)    Oxycodone hcl 15 mg x 3 months escribed to your pharmacy Fill dates 12/30/17, 01/29/18, 02/28/18   Magnesium oxide 500 mg also escribed.

## 2017-12-22 NOTE — Patient Instructions (Addendum)
____________________________________________________________________________________________  Medication Rules  Purpose: To inform patients, and their family members, of our rules and regulations.  Applies to: All patients receiving prescriptions (written or electronic).  Pharmacy of record: Pharmacy where electronic prescriptions will be sent. If written prescriptions are taken to a different pharmacy, please inform the nursing staff. The pharmacy listed in the electronic medical record should be the one where you would like electronic prescriptions to be sent.  Electronic prescriptions: In compliance with the [place] Strengthen Opioid Misuse Prevention (STOP) Act of 2017 (Session Law 2017-74/H243), effective January 12, 2018, all controlled substances must be electronically prescribed. Calling prescriptions to the pharmacy will cease to exist.  Prescription refills: Only during scheduled appointments. Applies to all prescriptions.  NOTE: The following applies primarily to controlled substances (Opioid* Pain Medications).   Patient's responsibilities: 1. Pain Pills: Bring all pain pills to every appointment (except for procedure appointments). 2. Pill Bottles: Bring pills in original pharmacy bottle. Always bring the newest bottle. Bring bottle, even if empty. 3. Medication refills: You are responsible for knowing and keeping track of what medications you take and those you need refilled. The day before your appointment: write a list of all prescriptions that need to be refilled. The day of the appointment: give the list to the admitting nurse. Prescriptions will be written only during appointments. If you forget a medication: it will not be "Called in", "Faxed", or "electronically sent". You will need to get another appointment to get these prescribed. No early refills. Do not call asking to have your prescription filled early. 4. Prescription Accuracy: You are responsible for  carefully inspecting your prescriptions before leaving our office. Have the discharge nurse carefully go over each prescription with you, before taking them home. Make sure that your name is accurately spelled, that your address is correct. Check the name and dose of your medication to make sure it is accurate. Check the number of pills, and the written instructions to make sure they are clear and accurate. Make sure that you are given enough medication to last until your next medication refill appointment. 5. Taking Medication: Take medication as prescribed. When it comes to controlled substances, taking less pills or less frequently than prescribed is permitted and encouraged. Never take more pills than instructed. Never take medication more frequently than prescribed.  6. Inform other Doctors: Always inform, all of your healthcare providers, of all the medications you take. 7. Pain Medication from other Providers: You are not allowed to accept any additional pain medication from any other Doctor or Healthcare provider. There are two exceptions to this rule. (see below) In the event that you require additional pain medication, you are responsible for notifying us, as stated below. 8. Medication Agreement: You are responsible for carefully reading and following our Medication Agreement. This must be signed before receiving any prescriptions from our practice. Safely store a copy of your signed Agreement. Violations to the Agreement will result in no further prescriptions. (Additional copies of our Medication Agreement are available upon request.) 9. Laws, Rules, & Regulations: All patients are expected to follow all Federal and State Laws, Statutes, Rules, & Regulations. Ignorance of the Laws does not constitute a valid excuse. The use of any illegal substances is prohibited. 10. Adopted CDC guidelines & recommendations: Target dosing levels will be at or below 60 MME/day. Use of benzodiazepines** is not  recommended.  Exceptions: There are only two exceptions to the rule of not receiving pain medications from other Healthcare Providers. 1.   Exception #1 (Emergencies): In the event of an emergency (i.e.: accident requiring emergency care), you are allowed to receive additional pain medication. However, you are responsible for: As soon as you are able, call our office [phone], at any time of the day or night, and leave a message stating your name, the date and nature of the emergency, and the name and dose of the medication prescribed. In the event that your call is answered by a member of our staff, make sure to document and save the date, time, and the name of the person that took your information.  2. Exception #2 (Planned Surgery): In the event that you are scheduled by another doctor or dentist to have any type of surgery or procedure, you are allowed (for a period no longer than 30 days), to receive additional pain medication, for the acute post-op pain. However, in this case, you are responsible for picking up a copy of our "Post-op Pain Management for Surgeons" handout, and giving it to your surgeon or dentist. This document is available at our office, and does not require an appointment to obtain it. Simply go to our office during business hours (Monday-Thursday from 8:00 AM to 4:00 PM) (Friday 8:00 AM to 12:00 Noon) or if you have a scheduled appointment with [place], prior to your surgery, and ask for it by name. In addition, you will need to provide [place] with your name, name of your surgeon, type of surgery, and date of procedure or surgery.  *Opioid medications include: morphine, codeine, oxycodone, oxymorphone, hydrocodone, hydromorphone, meperidine, tramadol, tapentadol, buprenorphine, fentanyl, methadone. **Benzodiazepine medications include: diazepam (Valium), alprazolam (Xanax), clonazepam (Klonopine), lorazepam (Ativan), clorazepate (Tranxene), chlordiazepoxide (Librium), estazolam (Prosom),  oxazepam (Serax), temazepam (Restoril), triazolam (Halcion) (Last updated: 03/11/2017) ____________________________________________________________________________________________   BMI Assessment: Estimated body mass index is 37.21 kg/m as calculated from the following:   Height as of this encounter: 5\' 9"  (1.753 m).   Weight as of this encounter: 252 lb (114.3 kg).  BMI interpretation table: BMI level Category Range association with higher incidence of chronic pain  <18 kg/m2 Underweight   18.5-24.9 kg/m2 Ideal body weight   25-29.9 kg/m2 Overweight Increased incidence by 20%  30-34.9 kg/m2 Obese (Class I) Increased incidence by 68%  35-39.9 kg/m2 Severe obesity (Class II) Increased incidence by 136%  >40 kg/m2 Extreme obesity (Class III) Increased incidence by 254%   Patient's current BMI Ideal Body weight  Body mass index is 37.21 kg/m. Ideal body weight: 70.7 kg (155 lb 13.8 oz) Adjusted ideal body weight: 88.1 kg (194 lb 5.1 oz)   BMI Readings from Last 4 Encounters:  12/22/17 37.21 kg/m  10/21/17 39.46 kg/m  09/23/17 35.58 kg/m  06/23/17 34.93 kg/m   Wt Readings from Last 4 Encounters:  12/22/17 252 lb (114.3 kg)  10/21/17 267 lb 3.2 oz (121.2 kg)  09/23/17 248 lb (112.5 kg)  06/23/17 240 lb (108.9 kg)    Oxycodone hcl 15 mg x 3 months escribed to your pharmacy Fill dates 12/30/17, 01/29/18, 02/28/18   Magnesium oxide 500 mg also escribed.

## 2017-12-22 NOTE — Progress Notes (Signed)
Nursing Pain Medication Assessment:  Safety precautions to be maintained throughout the outpatient stay will include: orient to surroundings, keep bed in low position, maintain call bell within reach at all times, provide assistance with transfer out of bed and ambulation.  Medication Inspection Compliance: Pill count conducted under aseptic conditions, in front of the patient. Neither the pills nor the bottle was removed from the patient's sight at any time. Once count was completed pills were immediately returned to the patient in their original bottle.  Medication: Oxycodone IR Pill/Patch Count: 23 of 120 pills remain Pill/Patch Appearance: Markings consistent with prescribed medication Bottle Appearance: Standard pharmacy container. Clearly labeled. Filled Date: 58 / 19 / 2019 Last Medication intake:  Today

## 2018-02-01 ENCOUNTER — Ambulatory Visit: Payer: Medicare Other | Admitting: Cardiovascular Disease

## 2018-03-15 ENCOUNTER — Encounter: Payer: Self-pay | Admitting: Nurse Practitioner

## 2018-03-15 ENCOUNTER — Other Ambulatory Visit: Payer: Self-pay

## 2018-03-15 ENCOUNTER — Ambulatory Visit: Payer: Medicare Other | Attending: Nurse Practitioner | Admitting: Nurse Practitioner

## 2018-03-15 VITALS — BP 138/77 | HR 86 | Temp 97.8°F | Resp 16 | Ht 69.0 in | Wt 258.0 lb

## 2018-03-15 DIAGNOSIS — M4726 Other spondylosis with radiculopathy, lumbar region: Secondary | ICD-10-CM | POA: Diagnosis present

## 2018-03-15 DIAGNOSIS — E1142 Type 2 diabetes mellitus with diabetic polyneuropathy: Secondary | ICD-10-CM | POA: Diagnosis present

## 2018-03-15 DIAGNOSIS — Z79891 Long term (current) use of opiate analgesic: Secondary | ICD-10-CM | POA: Diagnosis present

## 2018-03-15 DIAGNOSIS — G8929 Other chronic pain: Secondary | ICD-10-CM | POA: Insufficient documentation

## 2018-03-15 DIAGNOSIS — M533 Sacrococcygeal disorders, not elsewhere classified: Secondary | ICD-10-CM | POA: Diagnosis present

## 2018-03-15 DIAGNOSIS — G894 Chronic pain syndrome: Secondary | ICD-10-CM | POA: Insufficient documentation

## 2018-03-15 MED ORDER — OXYCODONE HCL 15 MG PO TABS: 15.0000 mg | ORAL_TABLET | Freq: Four times a day (QID) | ORAL | 0 refills | Status: DC | PRN

## 2018-03-15 MED ORDER — MAGNESIUM OXIDE -MG SUPPLEMENT 500 MG PO TABS: 1.0000 | ORAL_TABLET | Freq: Two times a day (BID) | ORAL | 0 refills | Status: DC

## 2018-03-15 NOTE — Progress Notes (Signed)
Patient's Name: Manuel Knight  MRN: [id number]  Referring Provider: [facility]  DOB: 1960/10/15  PCP: [facility]  DOS: 03/15/2018  Note by: Dionisio David, NP  Service setting: Ambulatory outpatient  Specialty: Interventional Pain Management  Location: [facility] (AMB) Pain Management Facility    Patient type: Established   HPI  Reason for Visit: Manuel Knight is a 58 y.o. year old, male patient, who comes today with a chief complaint of Back Pain (lower) and Knee Pain (left) Last Appointment: His last appointment at our practice was on 12/22/2017. I last saw him on 12/22/2017.  Pain Assessment: Today, Manuel Knight describes the severity of the Chronic pain as a 2 /10. He indicates the location/referral of the pain to be Back Lower, Left/buttocks, hits down to great toe. Onset was: More than a month ago. The quality of pain is described as Aching, Constant, Burning, Throbbing. Temporal description, or timing of pain is: Constant. Possible modifying factors: medications, procedures, rest. Manuel Knight  height is 5' 9" (1.753 m) and weight is 258 lb (117 kg). His temperature is 97.8 F (36.6 C). His blood pressure is 138/77 and his pulse is 86. His respiration is 16 and oxygen saturation is 97%.  He did fall down the heel after slipping in the snow. He denies any changes in his pain.   Controlled Substance Pharmacotherapy Assessment REMS (Risk Evaluation and Mitigation Strategy)  Analgesic:Oxycodone IR 15 mg every 6 hours (60 mg/day) MME/day:90 mg/day. Manuel Specking, RN  03/15/2018  9:46 AM  Sign when Signing Visit Nursing Pain Medication Assessment:  Safety precautions to be maintained throughout the outpatient stay will include: orient to surroundings, keep bed in low position, maintain call bell within reach at all times, provide assistance with transfer out of bed and ambulation.  Medication Inspection Compliance: Pill count conducted under aseptic  conditions, in front of the patient. Neither the pills nor the bottle was removed from the patient's sight at any time. Once count was completed pills were immediately returned to the patient in their original bottle.  Medication: Oxycodone IR Pill/Patch Count: 58 of 120 pills remain Pill/Patch Appearance: Markings consistent with prescribed medication Bottle Appearance: Standard pharmacy container. Clearly labeled. Filled Date: 2 / 17 / 2020 Last Medication intake:  Today   Pharmacokinetics: Liberation and absorption (onset of action): WNL Distribution (time to peak effect): WNL Metabolism and excretion (duration of action): WNL         Pharmacodynamics: Desired effects: Analgesia: Manuel Knight reports >50% benefit. Functional ability: Patient reports that medication allows him to accomplish basic ADLs Clinically meaningful improvement in function (CMIF): Sustained CMIF goals met Perceived effectiveness: Described as relatively effective, allowing for increase in activities of daily living (ADL) Undesirable effects: Side-effects or Adverse reactions: None reported Monitoring: [place] PMP: Online review of the past 51-monthperiod conducted. Compliant with practice rules and regulations Last UDS on record: Summary  Date Value Ref Range Status  09/23/2017 FINAL  Final    Comment:    ==================================================================== TOXASSURE SELECT 13 (MW) ==================================================================== Test                             Result       Flag       Units Drug Present and Declared for Prescription Verification   Alprazolam  162          EXPECTED   ng/mg creat   Alpha-hydroxyalprazolam        262          EXPECTED   ng/mg creat    Source of alprazolam is a scheduled prescription medication.    Alpha-hydroxyalprazolam is an expected metabolite of alprazolam.   Oxycodone                      189          EXPECTED   ng/mg  creat    Sources of oxycodone include scheduled prescription medications. Drug Present not Declared for Prescription Verification   Codeine                        57           UNEXPECTED ng/mg creat    Sources of codeine include scheduled prescription medications. Drug Absent but Declared for Prescription Verification   Amphetamine                    Not Detected UNEXPECTED ng/mg creat ==================================================================== Test                      Result    Flag   Units      Ref Range   Creatinine              157              mg/dL      >=20 ==================================================================== Declared Medications:  The flagging and interpretation on this report are based on the  following declared medications.  Unexpected results may arise from  inaccuracies in the declared medications.  **Note: The testing scope of this panel includes these medications:  Alprazolam (Xanax)  Amphetamine (Adderall)  Oxycodone  **Note: The testing scope of this panel does not include following  reported medications:  Albuterol  Albuterol (Combivent)  Amlodipine (Norvasc)  Aspirin (Aspirin 81)  Atorvastatin (Lipitor)  Canagliflozin (Invokana)  Carvedilol (Coreg)  Citalopram (Celexa)  Clonidine (Catapres)  Fluticasone (Advair)  Fluticasone (Flonase)  Gabapentin (Neurontin)  Insulin (Levemir)  Insulin (NovoLog)  Ipratropium (Combivent)  Magnesium  Melatonin  Metformin  Naloxone (Narcan)  Nitroglycerin  Pantoprazole  Sacubitril (Entresto)  Salmeterol (Advair)  Tiotropium (Spiriva)  Valsartan (Entresto)  Vitamin D3 ==================================================================== For clinical consultation, please call [phone]. ====================================================================    UDS interpretation: Unexpected findings: Absence of the parent compound in the presence of its metabolites could be due to lapse of  time since the last dose or unusual pharmacokinetics (Rapid Metabolism). Medication Assessment Form: Reviewed. Patient indicates being compliant with therapy Treatment compliance: Compliant Risk Assessment Profile: Aberrant behavior: See initial evaluations. None observed or detected today Comorbid factors increasing risk of overdose: See initial evaluation. No additional risks detected today Opioid risk tool (ORT):  Opioid Risk  03/15/2018  Alcohol 0  Illegal Drugs 0  Rx Drugs 0  Alcohol 0  Illegal Drugs 0  Rx Drugs 0  Age between 16-45 years  0  History of Preadolescent Sexual Abuse 0  Psychological Disease 2  ADD Positive  OCD Negative  Bipolar Negative  Depression 0  Opioid Risk Tool Scoring 2  Opioid Risk Interpretation Low Risk    ORT Scoring interpretation table:  Score <3 = Low Risk for SUD  Score between 4-7 = Moderate Risk for SUD  Score >8 =  High Risk for Opioid Abuse   Risk of substance use disorder (SUD): Moderate-to-High chronic comorbidities use of benzos and stimulants along with opioids  Risk Mitigation Strategies:  Patient Counseling: Covered spoken with patient on several occasions with the above combination his risk.  He admits that his wife will not allow him to come off of these medications Patient-Prescriber Agreement (PPA): Present and active  Notification to other healthcare providers: Done  Pharmacologic Plan: No change in therapy, at this time.            ROS  Constitutional: Denies any fever or chills Gastrointestinal: No reported hemesis, hematochezia, vomiting, or acute GI distress Musculoskeletal: Denies any acute onset joint swelling, redness, loss of ROM, or weakness Neurological: No reported episodes of acute onset apraxia, aphasia, dysarthria, agnosia, amnesia, paralysis, loss of coordination, or loss of consciousness  Medication Review  Fluticasone-Salmeterol, Insulin Detemir, Ipratropium-Albuterol, Magnesium Oxide, Melatonin, Vitamin D3,  albuterol, alprazolam, amLODipine, amphetamine-dextroamphetamine, aspirin EC, atorvastatin, canagliflozin, carvedilol, citalopram, cloNIDine, fenofibrate, fluticasone, gabapentin, insulin aspart, meloxicam, metFORMIN, naloxone, nitroGLYCERIN, oxyCODONE, pantoprazole, sacubitril-valsartan, and tiotropium  History Review  Allergy: Mr. Hnat is allergic to lodine [etodolac]; tylenol [acetaminophen]; iodine; and propoxyphene n-acetaminophen. Drug: Mr. Truex  reports no history of drug use. Alcohol:  reports no history of alcohol use. Tobacco:  reports that he quit smoking about 29 years ago. His smoking use included cigarettes. He started smoking about 51 years ago. He has a 28.50 pack-year smoking history. He has never used smokeless tobacco. Social: Mr. Elsass  reports that he quit smoking about 29 years ago. His smoking use included cigarettes. He started smoking about 51 years ago. He has a 28.50 pack-year smoking history. He has never used smokeless tobacco. He reports that he does not drink alcohol or use drugs. Medical:  has a past medical history of Anxiety, Arthritis, Cellulitis (2011), Chronic back pain, Claustrophobia, COPD (chronic obstructive pulmonary disease) ([place]), Diabetes mellitus, type 2 ([place]), Diverticulitis of colon (2008), GERD (gastroesophageal reflux disease), Heart attack ([place]) (2017), History of kidney stones, Hyperlipidemia, Hypertension, Obesity, and Pulmonary nodule. Surgical: Mr. Colee  has a past surgical history that includes Appendectomy (2007); Tonsillectomy; Coronary artery bypass graft (N/A, 02/06/2013); and left heart catheterization with coronary angiogram (N/A, 02/03/2013). Family: family history includes Cancer in his mother; Coronary artery disease in his mother and sister; Diabetes in his sister; Heart attack in his brother and father. Problem List: Mr. Stenseth does not have any pertinent problems on file.  Lab Review  Kidney Function Lab Results  Component  Value Date   BUN 17 02/17/2017   CREATININE 0.95 02/17/2017   BCR 19 06/04/2016   GFRAA >60 02/17/2017   GFRNONAA >60 02/17/2017  Liver Function Lab Results  Component Value Date   AST 16 06/04/2016   ALT 26 06/04/2016   ALBUMIN 4.3 06/04/2016  Note: Above Lab results reviewed.  Imaging Review  ECHOCARDIOGRAM COMPLETE                            *[facility]*                   [address] [place]                           [place], [place] [address]  [phone]  ------------------------------------------------------------------- Transthoracic Echocardiography  Patient:    Mical, Kicklighter MR #:       [id number] Study Date: 11/11/2017 Gender:     M Age:        22 Height:     175.3 cm Weight:     121.2 kg BSA:        2.48 m^2 Pt. Status: Room:   SONOGRAPHER  [facility]  ATTENDING    Kate Sable, MD  Colma, MD  [place]ertile, MD  PERFORMING   [facility]  cc:  ------------------------------------------------------------------- LV EF: 55%  ------------------------------------------------------------------- Indications:     I25.708  ------------------------------------------------------------------- History:   PMH:   Coronary artery disease.  Chronic obstructive pulmonary disease. NSTEMI.  Risk factors:  Hypertension. Diabetes mellitus. Obese.  ------------------------------------------------------------------- Study Conclusions  - Left ventricle: The cavity size was mildly dilated. Wall   thickness was normal. The estimated ejection fraction was 55%.   There is hypokinesis of the basalinferior myocardium.   Indeterminate diastolic function. - Aortic valve: Mildly calcified annulus. Trileaflet. - Mitral valve: Mildly calcified annulus. There was trivial   regurgitation. - Tricuspid valve: There was trivial regurgitation. - Pulmonary arteries: Systolic pressure could not be  accurately   estimated. - Pericardium, extracardiac: A prominent pericardial fat pad was   present.  ------------------------------------------------------------------- Labs, prior tests, procedures, and surgery: Echocardiography (November 2015).     EF was 35%.  Coronary artery bypass grafting.  ------------------------------------------------------------------- Study data:  Comparison was made to the study of November 2015. Study status:  Routine.  Procedure:  The patient reported no pain pre or post test. Transthoracic echocardiography. Image quality was adequate.  Study completion:  There were no complications. Transthoracic echocardiography.  M-mode, complete 2D, spectral Doppler, and color Doppler.  Birthdate:  Patient birthdate: 1960/10/01.  Age:  Patient is 58 yr old.  Sex:  Gender: male. BMI: 39.4 kg/m^2.  Blood pressure:     130/78  Patient status: Outpatient.  Study date:  Study date: 11/11/2017. Study time: 10:47 AM.  -------------------------------------------------------------------  ------------------------------------------------------------------- Left ventricle:  The cavity size was mildly dilated. Wall thickness was normal. The estimated ejection fraction was 55%.  Regional wall motion abnormalities:   There is hypokinesis of the basalinferior myocardium. Indeterminate diastolic function.  ------------------------------------------------------------------- Aortic valve:   Mildly calcified annulus. Trileaflet.  Doppler: There was no significant regurgitation.  ------------------------------------------------------------------- Aorta:  Aortic root: The aortic root was normal in size.  ------------------------------------------------------------------- Mitral valve:   Mildly calcified annulus.  Doppler:  There was trivial regurgitation.  ------------------------------------------------------------------- Left atrium:  The atrium was normal in  size.  ------------------------------------------------------------------- Right ventricle:  The cavity size was normal. Systolic function was normal.  ------------------------------------------------------------------- Pulmonic valve:    The valve appears to be grossly normal. Doppler:  There was no significant regurgitation.  ------------------------------------------------------------------- Tricuspid valve:   The valve appears to be grossly normal. Doppler:  There was trivial regurgitation.  ------------------------------------------------------------------- Pulmonary artery:    Systolic pressure could not be accurately estimated.  ------------------------------------------------------------------- Right atrium:  The atrium was normal in size.  ------------------------------------------------------------------- Pericardium:  A prominent pericardial fat pad was present.  ------------------------------------------------------------------- Systemic veins: Inferior vena cava: Not visualized.  ------------------------------------------------------------------- Measurements   Left ventricle                             Value        Reference  LV  ID, ED, PLAX chordal            (H)     62.4  mm     43 - 52  LV ID, ES, PLAX chordal            (H)     43.7  mm     23 - 38  LV fx shortening, PLAX chordal             30    %      >=29  LV PW thickness, ED                        10.6  mm     ----------  IVS/LV PW ratio, ED                        0.82         <=1.3  Stroke volume, 2D                          60    ml     ----------  Stroke volume/bsa, 2D                      24    ml/m^2 ----------  LV ejection fraction, 1-p A4C              59    %      ----------  LV end-diastolic volume, 2-p               132   ml     ----------  LV end-systolic volume, 2-p                58    ml     ----------  LV ejection fraction, 2-p                  56    %      ----------  Stroke volume,  2-p                         74    ml     ----------  LV end-diastolic volume/bsa, 2-p           53    ml/m^2 ----------  LV end-systolic volume/bsa, 2-p            24    ml/m^2 ----------  Stroke volume/bsa, 2-p                     29.6  ml/m^2 ----------  LV e&', lateral                             10.3  cm/s   ----------  LV E/e&', lateral                           6.78         ----------  LV e&', medial                              5.03  cm/s   ----------  LV E/e&', medial  13.88        ----------  LV e&', average                             7.67  cm/s   ----------  LV E/e&', average                           9.11         ----------    Ventricular septum                         Value        Reference  IVS thickness, ED                          8.74  mm     ----------    LVOT                                       Value        Reference  LVOT ID, S                                 21    mm     ----------  LVOT area                                  3.46  cm^2   ----------  LVOT peak velocity, S                      84.4  cm/s   ----------  LVOT mean velocity, S                      60.1  cm/s   ----------  LVOT VTI, S                                17.2  cm     ----------    Aorta                                      Value        Reference  Aortic root ID, ED                         40    mm     ----------    Left atrium                                Value        Reference  LA ID, A-P, ES                             35    mm     ----------  LA ID/bsa, A-P  1.41  cm/m^2 <=2.2  LA volume, S                               61.9  ml     ----------  LA volume/bsa, S                           24.9  ml/m^2 ----------  LA volume, ES, 1-p A4C                     55.3  ml     ----------  LA volume/bsa, ES, 1-p A4C                 22.3  ml/m^2 ----------  LA volume, ES, 1-p A2C                     62.9  ml     ----------  LA volume/bsa, ES, 1-p  A2C                 25.3  ml/m^2 ----------    Mitral valve                               Value        Reference  Mitral E-wave peak velocity                69.8  cm/s   ----------  Mitral A-wave peak velocity                83.9  cm/s   ----------  Mitral deceleration time           (H)     298   ms     150 - 230  Mitral E/A ratio, peak                     0.8          ----------  Mitral maximal regurg velocity,            244   cm/s   ----------  PISA    Tricuspid valve                            Value        Reference  Tricuspid regurg peak velocity             100   cm/s   ----------  Tricuspid peak RV-RA gradient              4     mm Hg  ----------    Right atrium                               Value        Reference  RA ID, S-I, ES, A4C                        48.1  mm     34 - 49  RA area, ES, A4C                           14    cm^2  8.3 - 19.5  RA volume, ES, A/L                         33.3  ml     ----------  RA volume/bsa, ES, A/L                     13.4  ml/m^2 ----------    Right ventricle                            Value        Reference  TAPSE                                      17.4  mm     ----------  RV s&', lateral, S                          10.3  cm/s   ----------  Legend: (L)  and  (H)  mark values outside specified reference range.  ------------------------------------------------------------------- Prepared and Electronically Authenticated by  Rozann Lesches, M.D. 2019-10-31T15:25:23 Note: Reviewed        Physical Exam  General appearance: Well nourished, well developed, and well hydrated. In no apparent acute distress Mental status: Alert, oriented x 3 (person, place, & time)       Respiratory: No evidence of acute respiratory distress Eyes: PERLA Vitals: BP 138/77   Pulse 86   Temp 97.8 F (36.6 C)   Resp 16   Ht 5' 9" (1.753 m)   Wt 258 lb (117 kg)   SpO2 97%   BMI 38.10 kg/m  BMI: Estimated body mass index is 38.1 kg/m as calculated  from the following:   Height as of this encounter: 5' 9" (1.753 m).   Weight as of this encounter: 258 lb (117 kg). Ideal: Ideal body weight: 70.7 kg (155 lb 13.8 oz) Adjusted ideal body weight: 89.2 kg (196 lb 11.5 oz) Lumbar Spine Area Exam  Skin & Axial Inspection: No masses, redness, or swelling Alignment: Symmetrical Functional ROM: Unrestricted ROM       Stability: No instability detected Muscle Tone/Strength: Functionally intact. No obvious neuro-muscular anomalies detected. Sensory (Neurological): Unimpaired Palpation: No palpable anomalies       Provocative Tests: Hyperextension/rotation test: deferred today       Lumbar quadrant test (Kemp's test): deferred today       Lateral bending test: deferred today       Patrick's Maneuver: deferred today                    Lower Extremity Exam    Side: Right lower extremity  Side: Left lower extremity  Stability: No instability observed          Stability: No instability observed          Skin & Extremity Inspection: Skin color, temperature, and hair growth are WNL. No peripheral edema or cyanosis. No masses, redness, swelling, asymmetry, or associated skin lesions. No contractures.  Skin & Extremity Inspection: Skin color, temperature, and hair growth are WNL. No peripheral edema or cyanosis. No masses, redness, swelling, asymmetry, or associated skin lesions. No contractures.  Functional ROM: Unrestricted ROM  Functional ROM: Unrestricted ROM                  Muscle Tone/Strength: Functionally intact. No obvious neuro-muscular anomalies detected.  Muscle Tone/Strength: Functionally intact. No obvious neuro-muscular anomalies detected.  Sensory (Neurological): Unimpaired        Sensory (Neurological): Unimpaired            Palpation: No palpable anomalies  Palpation: No palpable anomalies   Gait & Posture Assessment  Ambulation: Unassisted Gait: Relatively normal for age and body habitus Posture: WNL   Assessment    Status Diagnosis  Controlled Controlled Controlled 1. Lumbar spondylosis with lumbar radiculitis (Left)   2. Chronic sacroiliac joint pain (Bilateral) (L>R)   3. Diabetic peripheral neuropathy (HCC) (lower extremity)   4. Chronic pain syndrome   5. Long term prescription opiate use      Updated Problems: No problems updated.  Plan of Care  Pharmacotherapy (Medications Ordered): Meds ordered this encounter  Medications  . Magnesium Oxide 500 MG TABS    Sig: Take 1 tablet (500 mg total) by mouth 2 (two) times daily.    Dispense:  180 tablet    Refill:  0    Order Specific Question:   Supervising Provider    Answer:   Milinda Pointer [phone]  . oxyCODONE (ROXICODONE) 15 MG immediate release tablet    Sig: Take 1 tablet (15 mg total) by mouth every 6 (six) hours as needed for up to 30 days.    Dispense:  120 tablet    Refill:  0    Do not add this medication to the electronic "Automatic Refill" notification system. Patient may have prescription filled one day early if pharmacy is closed on scheduled refill date.    Order Specific Question:   Supervising Provider    Answer:   Milinda Pointer [phone]  . oxyCODONE (ROXICODONE) 15 MG immediate release tablet    Sig: Take 1 tablet (15 mg total) by mouth every 6 (six) hours as needed for up to 30 days for pain.    Dispense:  120 tablet    Refill:  0    Do not add this medication to the electronic "Automatic Refill" notification system. Patient may have prescription filled one day early if pharmacy is closed on scheduled refill date.    Order Specific Question:   Supervising Provider    Answer:   Milinda Pointer [phone]  . oxyCODONE (ROXICODONE) 15 MG immediate release tablet    Sig: Take 1 tablet (15 mg total) by mouth every 6 (six) hours as needed for up to 30 days for pain.    Dispense:  120 tablet    Refill:  0    Do not add this medication to the electronic "Automatic Refill" notification system. Patient may have  prescription filled one day early if pharmacy is closed on scheduled refill date.    Order Specific Question:   Supervising Provider    Answer:   Milinda Pointer [[id number]]   Administered today: [name] had no medications administered during this visit.  Orders:  Orders Placed This Encounter  Procedures  . ToxASSURE Select 13 (MW), Urine    Volume: 30 ml(s). Minimum 3 ml of urine is needed. Document temperature of fresh sample. Indications: Long term (current) use of opiate analgesic (U38.453)   Interventional options: Planned follow-up:   Not at this time.  Plan: Return in about 3 months (around 06/15/2018) for MedMgmt.    Considering:  Diagnostic bilateralintra-articular knee  injection Diagnostic bilateral genicular nerve block Possible bilateral genicular nerve radiofrequency ablation Diagnostic bilateral lumbar facet block Possible bilaterallumbar facet radiofrequency ablation(Left side done on 03/30/2016) Diagnostic left-sided L4-5 lumbar epidural steroid injection Diagnostic bilateral sacroiliac joint block Possible bilateral sacroiliac joint radiofrequency ablation   Palliative PRN treatment(s):  Palliativebilateral intra-articular knee injection Palliativebilateral genicular nerveblock Palliativebilateral lumbar facetblock Palliativeleft-sided L4-5 lumbar epidural steroid injection Palliativebilateral sacroiliac jointblock     Note by: Dionisio David, NP Date: 03/15/2018; Time: 10:48 AM

## 2018-03-15 NOTE — Progress Notes (Signed)
Nursing Pain Medication Assessment:  Safety precautions to be maintained throughout the outpatient stay will include: orient to surroundings, keep bed in low position, maintain call bell within reach at all times, provide assistance with transfer out of bed and ambulation.  Medication Inspection Compliance: Pill count conducted under aseptic conditions, in front of the patient. Neither the pills nor the bottle was removed from the patient's sight at any time. Once count was completed pills were immediately returned to the patient in their original bottle.  Medication: Oxycodone IR Pill/Patch Count: 58 of 120 pills remain Pill/Patch Appearance: Markings consistent with prescribed medication Bottle Appearance: Standard pharmacy container. Clearly labeled. Filled Date: 2 / 17 / 2020 Last Medication intake:  Today

## 2018-03-15 NOTE — Patient Instructions (Signed)
____________________________________________________________________________________________  Medication Rules  Purpose: To inform patients, and their family members, of our rules and regulations.  Applies to: All patients receiving prescriptions (written or electronic).  Pharmacy of record: Pharmacy where electronic prescriptions will be sent. If written prescriptions are taken to a different pharmacy, please inform the nursing staff. The pharmacy listed in the electronic medical record should be the one where you would like electronic prescriptions to be sent.  Electronic prescriptions: In compliance with the [place] Strengthen Opioid Misuse Prevention (STOP) Act of 2017 (Session Law 2017-74/H243), effective January 12, 2018, all controlled substances must be electronically prescribed. Calling prescriptions to the pharmacy will cease to exist.  Prescription refills: Only during scheduled appointments. Applies to all prescriptions.  NOTE: The following applies primarily to controlled substances (Opioid* Pain Medications).   Patient's responsibilities: 1. Pain Pills: Bring all pain pills to every appointment (except for procedure appointments). 2. Pill Bottles: Bring pills in original pharmacy bottle. Always bring the newest bottle. Bring bottle, even if empty. 3. Medication refills: You are responsible for knowing and keeping track of what medications you take and those you need refilled. The day before your appointment: write a list of all prescriptions that need to be refilled. The day of the appointment: give the list to the admitting nurse. Prescriptions will be written only during appointments. No prescriptions will be written on procedure days. If you forget a medication: it will not be "Called in", "Faxed", or "electronically sent". You will need to get another appointment to get these prescribed. No early refills. Do not call asking to have your prescription filled  early. 4. Prescription Accuracy: You are responsible for carefully inspecting your prescriptions before leaving our office. Have the discharge nurse carefully go over each prescription with you, before taking them home. Make sure that your name is accurately spelled, that your address is correct. Check the name and dose of your medication to make sure it is accurate. Check the number of pills, and the written instructions to make sure they are clear and accurate. Make sure that you are given enough medication to last until your next medication refill appointment. 5. Taking Medication: Take medication as prescribed. When it comes to controlled substances, taking less pills or less frequently than prescribed is permitted and encouraged. Never take more pills than instructed. Never take medication more frequently than prescribed.  6. Inform other Doctors: Always inform, all of your healthcare providers, of all the medications you take. 7. Pain Medication from other Providers: You are not allowed to accept any additional pain medication from any other Doctor or Healthcare provider. There are two exceptions to this rule. (see below) In the event that you require additional pain medication, you are responsible for notifying us, as stated below. 8. Medication Agreement: You are responsible for carefully reading and following our Medication Agreement. This must be signed before receiving any prescriptions from our practice. Safely store a copy of your signed Agreement. Violations to the Agreement will result in no further prescriptions. (Additional copies of our Medication Agreement are available upon request.) 9. Laws, Rules, & Regulations: All patients are expected to follow all Federal and State Laws, Statutes, Rules, & Regulations. Ignorance of the Laws does not constitute a valid excuse. The use of any illegal substances is prohibited. 10. Adopted CDC guidelines & recommendations: Target dosing levels will be  at or below 60 MME/day. Use of benzodiazepines** is not recommended.  Exceptions: There are only two exceptions to the rule of not   receiving pain medications from other Healthcare Providers. 1. Exception #1 (Emergencies): In the event of an emergency (i.e.: accident requiring emergency care), you are allowed to receive additional pain medication. However, you are responsible for: As soon as you are able, call our office [phone], at any time of the day or night, and leave a message stating your name, the date and nature of the emergency, and the name and dose of the medication prescribed. In the event that your call is answered by a member of our staff, make sure to document and save the date, time, and the name of the person that took your information.  2. Exception #2 (Planned Surgery): In the event that you are scheduled by another doctor or dentist to have any type of surgery or procedure, you are allowed (for a period no longer than 30 days), to receive additional pain medication, for the acute post-op pain. However, in this case, you are responsible for picking up a copy of our "Post-op Pain Management for Surgeons" handout, and giving it to your surgeon or dentist. This document is available at our office, and does not require an appointment to obtain it. Simply go to our office during business hours (Monday-Thursday from 8:00 AM to 4:00 PM) (Friday 8:00 AM to 12:00 Noon) or if you have a scheduled appointment with us, prior to your surgery, and ask for it by name. In addition, you will need to provide us with your name, name of your surgeon, type of surgery, and date of procedure or surgery.  *Opioid medications include: morphine, codeine, oxycodone, oxymorphone, hydrocodone, hydromorphone, meperidine, tramadol, tapentadol, buprenorphine, fentanyl, methadone. **Benzodiazepine medications include: diazepam (Valium), alprazolam (Xanax), clonazepam (Klonopine), lorazepam (Ativan), clorazepate  (Tranxene), chlordiazepoxide (Librium), estazolam (Prosom), oxazepam (Serax), temazepam (Restoril), triazolam (Halcion) (Last updated: 03/11/2017) ____________________________________________________________________________________________    

## 2018-03-18 ENCOUNTER — Ambulatory Visit: Payer: Medicare Other | Admitting: Cardiovascular Disease

## 2018-03-18 LAB — TOXASSURE SELECT 13 (MW), URINE

## 2018-03-22 ENCOUNTER — Other Ambulatory Visit: Payer: Self-pay | Admitting: Nurse Practitioner

## 2018-03-22 NOTE — Progress Notes (Signed)
Please evaluate his last 4 UDS's We have talked about his use of narcotics 541, benzodiazepines 551 and stimulants 291 and the potential risk associated 500 with this combination.  He states he has to take these medications because of his wife.  However with this urine they are not showing up as being used.

## 2018-05-11 ENCOUNTER — Telehealth: Payer: Self-pay | Admitting: *Deleted

## 2018-05-11 NOTE — Telephone Encounter (Signed)
   Primary Cardiologist:  Kate Sable, MD   Patient contacted.  History reviewed.  No symptoms to suggest any unstable cardiac conditions.  Based on discussion, with current pandemic situation, we will be postponing this appointment for Manuel Knight with a plan for f/u on 08/10/2018.  If symptoms change, he has been instructed to contact our office.

## 2018-05-16 ENCOUNTER — Ambulatory Visit: Payer: Medicare Other | Admitting: Cardiovascular Disease

## 2018-05-26 ENCOUNTER — Encounter: Payer: Self-pay | Admitting: Cardiology

## 2018-06-13 ENCOUNTER — Ambulatory Visit: Payer: Medicare Other | Admitting: Nurse Practitioner

## 2018-06-15 ENCOUNTER — Telehealth: Payer: Self-pay | Admitting: *Deleted

## 2018-06-15 ENCOUNTER — Encounter: Payer: Self-pay | Admitting: Pain Medicine

## 2018-06-15 ENCOUNTER — Ambulatory Visit: Payer: Medicare Other | Admitting: Pain Medicine

## 2018-06-16 ENCOUNTER — Other Ambulatory Visit: Payer: Self-pay

## 2018-06-16 ENCOUNTER — Ambulatory Visit: Payer: Medicare Other | Attending: Nurse Practitioner | Admitting: Pain Medicine

## 2018-06-16 DIAGNOSIS — Z79899 Other long term (current) drug therapy: Secondary | ICD-10-CM

## 2018-06-16 DIAGNOSIS — M899 Disorder of bone, unspecified: Secondary | ICD-10-CM

## 2018-06-16 DIAGNOSIS — G894 Chronic pain syndrome: Secondary | ICD-10-CM

## 2018-06-16 DIAGNOSIS — G8929 Other chronic pain: Secondary | ICD-10-CM

## 2018-06-16 DIAGNOSIS — M545 Low back pain: Secondary | ICD-10-CM

## 2018-06-16 DIAGNOSIS — M25561 Pain in right knee: Secondary | ICD-10-CM

## 2018-06-16 DIAGNOSIS — M79604 Pain in right leg: Secondary | ICD-10-CM | POA: Diagnosis not present

## 2018-06-16 DIAGNOSIS — M25562 Pain in left knee: Secondary | ICD-10-CM

## 2018-06-16 DIAGNOSIS — Z789 Other specified health status: Secondary | ICD-10-CM | POA: Insufficient documentation

## 2018-06-16 DIAGNOSIS — M79605 Pain in left leg: Secondary | ICD-10-CM

## 2018-06-16 MED ORDER — OXYCODONE HCL 15 MG PO TABS: 15.0000 mg | ORAL_TABLET | Freq: Four times a day (QID) | ORAL | 0 refills | Status: DC | PRN

## 2018-06-16 NOTE — Progress Notes (Signed)
Pain Management Virtual Encounter Note - Virtual Visit via Telephone Telehealth (real-time audio visits between healthcare provider and patient).   Patient's Phone No. & Preferred Pharmacy:  [phone] (home); There is no such number on file (mobile).; (Preferred) [phone] No e-mail address on record  [name], [name] [place] [address] Phone: [phone] Fax: [phone]    Pre-screening note:  Our staff contacted [name] and offered him an "in person", "face-to-face" appointment versus a telephone encounter. He indicated preferring the telephone encounter, at this time.   Reason for Virtual Visit: COVID-19*  Social distancing based on CDC and AMA recommendations.   I contacted Thaniel Coluccio on 06/16/2018 at 1:55 PM via telephone.      I clearly identified myself as Gaspar Cola, MD. I verified that I was speaking with the correct person using two identifiers (Name: Manuel Knight, and date of birth: 19-Sep-1960).  Advanced Informed Consent I sought verbal advanced consent from Manuel Knight for virtual visit interactions. I informed [name] of possible security and privacy concerns, risks, and limitations associated with providing "not-in-person" medical evaluation and management services. I also informed [name] of the availability of "in-person" appointments. Finally, I informed him that there would be a charge for the virtual visit and that he could be  personally, fully or partially, financially responsible for it. [name] expressed understanding and agreed to proceed.   Historic Elements   [name] is a 58 y.o. year old, male patient evaluated today after his last encounter by our practice on 06/15/2018. [name]  has a past medical history of Anxiety, Arthritis, Cellulitis (2011), Chronic back pain, Claustrophobia, COPD (chronic obstructive pulmonary disease) ([place]), Diabetes mellitus, type 2  ([place]), Diverticulitis of colon (2008), GERD (gastroesophageal reflux disease), Heart attack ([place]) (2017), History of kidney stones, Hyperlipidemia, Hypertension, Obesity, and Pulmonary nodule. He also  has a past surgical history that includes Appendectomy (2007); Tonsillectomy; Coronary artery bypass graft (N/A, 02/06/2013); and left heart catheterization with coronary angiogram (N/A, 02/03/2013). [name] has a current medication list which includes the following prescription(s): albuterol, alprazolam, amlodipine, amphetamine-dextroamphetamine, aspirin ec, atorvastatin, canagliflozin, carvedilol, vitamin d3, citalopram, clonidine, fenofibrate, fluticasone, fluticasone-salmeterol, gabapentin, gabapentin, insulin aspart, ipratropium-albuterol, levemir flextouch, magnesium oxide, melatonin, meloxicam, metformin, naloxone, naloxone, nitroglycerin, oxycodone, oxycodone, oxycodone, pantoprazole, sacubitril-valsartan, and tiotropium. He  reports that he quit smoking about 29 years ago. His smoking use included cigarettes. He started smoking about 51 years ago. He has a 28.50 pack-year smoking history. He has never used smokeless tobacco. He reports that he does not drink alcohol or use drugs. [name] is allergic to lodine [etodolac]; tylenol [acetaminophen]; iodine; and propoxyphene n-acetaminophen.   HPI  Today, he is being contacted for medication management.  Pharmacotherapy Assessment  Analgesic: Oxycodone IR 15 mg 1 tablet p.o. every 6 hours (60 mg/day of oxycodone) MME/day: 90 mg/day.   Monitoring: Pharmacotherapy: No side-effects or adverse reactions reported. [place] PMP: PDMP reviewed during this encounter.       Compliance: No problems identified. Effectiveness: Clinically acceptable. Plan: Refer to "POC".  Pertinent Labs   SAFETY SCREENING Profile Lab Results  Component Value Date   STAPHAUREUS POSITIVE (A) 02/05/2013   MRSAPCR NEGATIVE 03/15/2015   HCVAB NEGATIVE 11/22/2008   HIV Non  Reactive 03/15/2015   Renal Function Lab Results  Component Value Date   BUN 17 02/17/2017   CREATININE 0.95 02/17/2017   BCR 19 06/04/2016   GFRAA >60 02/17/2017  GFRNONAA >60 02/17/2017   Hepatic Function Lab Results  Component Value Date   AST 16 06/04/2016   ALT 26 06/04/2016   ALBUMIN 4.3 06/04/2016   UDS Summary  Date Value Ref Range Status  03/15/2018 FINAL  Final    Comment:    ==================================================================== TOXASSURE SELECT 13 (MW) ==================================================================== Test                             Result       Flag       Units Drug Present not Declared for Prescription Verification   Desmethyldiazepam              148          UNEXPECTED ng/mg creat   Oxazepam                       337          UNEXPECTED ng/mg creat   Temazepam                      581          UNEXPECTED ng/mg creat    Desmethyldiazepam, oxazepam, and temazepam are benzodiazepine    drugs, but may also be present as common metabolites of other    benzodiazepine drugs, including diazepam.   Alcohol, Ethyl                 0.038        UNEXPECTED g/dL    Sources of ethyl alcohol include alcoholic beverages or as a    fermentation product of glucose; glucose is present in this    specimen.  Interpret result with caution, as the presence of    ethyl alcohol is likely due, at least in part, to fermentation of    glucose. Drug Absent but Declared for Prescription Verification   Amphetamine                    Not Detected UNEXPECTED ng/mg creat   Alprazolam                     Not Detected UNEXPECTED ng/mg creat   Oxycodone                      Not Detected UNEXPECTED ng/mg creat ==================================================================== Test                      Result    Flag   Units      Ref Range   Creatinine              54               mg/dL       >=20 ==================================================================== Declared Medications:  The flagging and interpretation on this report are based on the  following declared medications.  Unexpected results may arise from  inaccuracies in the declared medications.  **Note: The testing scope of this panel includes these medications:  Alprazolam (Xanax)  Amphetamine (Adderall)  Oxycodone (Roxicodone)  **Note: The testing scope of this panel does not include following  reported medications:  Albuterol  Albuterol (Combivent)  Amlodipine (Norvasc)  Aspirin (Aspirin 81)  Atorvastatin (Lipitor)  Canagliflozin (Invokana)  Carvedilol (Coreg)  Citalopram (Celexa)  Clonidine (Catapres)  Fenofibrate  Fluticasone (Advair)  Fluticasone (Flonase)  Gabapentin  Insulin (Levemir)  Insulin (NovoLog)  Ipratropium (Combivent)  Magnesium (Mag-Ox)  Melatonin  Meloxicam (Mobic)  Metformin (Glucophage)  Naloxone (Narcan)  Nitroglycerin  Pantoprazole (Protonix)  Sacubitril (Entresto)  Salmeterol (Advair)  Tiotropium (Spiriva)  Valsartan (Entresto)  Vitamin D3 ==================================================================== For clinical consultation, please call [phone]. ====================================================================    Note: Above Lab results reviewed.  Recent imaging  ECHOCARDIOGRAM COMPLETE                            *[facility]*                   [address]                           [place], [place] [address]                            [phone]  ------------------------------------------------------------------- Transthoracic Echocardiography  Patient:    Tavon, Magnussen MR #:       [id number] Study Date: 11/11/2017 Gender:     M Age:        65 Height:     175.3 cm Weight:     121.2 kg BSA:        2.48 m^2 Pt. Status: Room:   SONOGRAPHER  [facility]  ATTENDING    Kate Sable, MD  [place], MD  [place], MD  PERFORMING   [facility]  cc:  ------------------------------------------------------------------- LV EF: 55%  ------------------------------------------------------------------- Indications:     I25.708  ------------------------------------------------------------------- History:   PMH:   Coronary artery disease.  Chronic obstructive pulmonary disease. NSTEMI.  Risk factors:  Hypertension. Diabetes mellitus. Obese.  ------------------------------------------------------------------- Study Conclusions  - Left ventricle: The cavity size was mildly dilated. Wall   thickness was normal. The estimated ejection fraction was 55%.   There is hypokinesis of the basalinferior myocardium.   Indeterminate diastolic function. - Aortic valve: Mildly calcified annulus. Trileaflet. - Mitral valve: Mildly calcified annulus. There was trivial   regurgitation. - Tricuspid valve: There was trivial regurgitation. - Pulmonary arteries: Systolic pressure could not be accurately   estimated. - Pericardium, extracardiac: A prominent pericardial fat pad was   present.  ------------------------------------------------------------------- Labs, prior tests, procedures, and surgery: Echocardiography (November 2015).     EF was 35%.  Coronary artery bypass grafting.  ------------------------------------------------------------------- Study data:  Comparison was made to the study of November 2015. Study status:  Routine.  Procedure:  The patient reported no pain pre or post test. Transthoracic echocardiography. Image quality was adequate.  Study completion:  There were no complications. Transthoracic echocardiography.  M-mode, complete 2D, spectral Doppler, and color Doppler.  Birthdate:  Patient birthdate: 1960-08-26.  Age:  Patient is 58 yr old.  Sex:  Gender: male. BMI: 39.4 kg/m^2.  Blood pressure:     130/78  Patient status: Outpatient.  Study  date:  Study date: 11/11/2017. Study time: 10:47 AM.  -------------------------------------------------------------------  ------------------------------------------------------------------- Left ventricle:  The cavity size was mildly dilated. Wall thickness was normal. The estimated ejection fraction was 55%.  Regional wall motion abnormalities:   There is hypokinesis of the basalinferior myocardium. Indeterminate diastolic function.  ------------------------------------------------------------------- Aortic valve:   Mildly calcified annulus. Trileaflet.  Doppler: There was no significant regurgitation.  ------------------------------------------------------------------- Aorta:  Aortic root: The aortic root  was normal in size.  ------------------------------------------------------------------- Mitral valve:   Mildly calcified annulus.  Doppler:  There was trivial regurgitation.  ------------------------------------------------------------------- Left atrium:  The atrium was normal in size.  ------------------------------------------------------------------- Right ventricle:  The cavity size was normal. Systolic function was normal.  ------------------------------------------------------------------- Pulmonic valve:    The valve appears to be grossly normal. Doppler:  There was no significant regurgitation.  ------------------------------------------------------------------- Tricuspid valve:   The valve appears to be grossly normal. Doppler:  There was trivial regurgitation.  ------------------------------------------------------------------- Pulmonary artery:    Systolic pressure could not be accurately estimated.  ------------------------------------------------------------------- Right atrium:  The atrium was normal in size.  ------------------------------------------------------------------- Pericardium:  A prominent pericardial fat pad was  present.  ------------------------------------------------------------------- Systemic veins: Inferior vena cava: Not visualized.  ------------------------------------------------------------------- Measurements   Left ventricle                             Value        Reference  LV ID, ED, PLAX chordal            (H)     62.4  mm     43 - 52  LV ID, ES, PLAX chordal            (H)     43.7  mm     23 - 38  LV fx shortening, PLAX chordal             30    %      >=29  LV PW thickness, ED                        10.6  mm     ----------  IVS/LV PW ratio, ED                        0.82         <=1.3  Stroke volume, 2D                          60    ml     ----------  Stroke volume/bsa, 2D                      24    ml/m^2 ----------  LV ejection fraction, 1-p A4C              59    %      ----------  LV end-diastolic volume, 2-p               132   ml     ----------  LV end-systolic volume, 2-p                58    ml     ----------  LV ejection fraction, 2-p                  56    %      ----------  Stroke volume, 2-p                         74    ml     ----------  LV end-diastolic volume/bsa, 2-p           53    ml/m^2 ----------  LV end-systolic volume/bsa, 2-p  24    ml/m^2 ----------  Stroke volume/bsa, 2-p                     29.6  ml/m^2 ----------  LV e&', lateral                             10.3  cm/s   ----------  LV E/e&', lateral                           6.78         ----------  LV e&', medial                              5.03  cm/s   ----------  LV E/e&', medial                            13.88        ----------  LV e&', average                             7.67  cm/s   ----------  LV E/e&', average                           9.11         ----------    Ventricular septum                         Value        Reference  IVS thickness, ED                          8.74  mm     ----------    LVOT                                       Value        Reference  LVOT ID,  S                                 21    mm     ----------  LVOT area                                  3.46  cm^2   ----------  LVOT peak velocity, S                      84.4  cm/s   ----------  LVOT mean velocity, S                      60.1  cm/s   ----------  LVOT VTI, S                                17.2  cm     ----------    Aorta  Value        Reference  Aortic root ID, ED                         40    mm     ----------    Left atrium                                Value        Reference  LA ID, A-P, ES                             35    mm     ----------  LA ID/bsa, A-P                             1.41  cm/m^2 <=2.2  LA volume, S                               61.9  ml     ----------  LA volume/bsa, S                           24.9  ml/m^2 ----------  LA volume, ES, 1-p A4C                     55.3  ml     ----------  LA volume/bsa, ES, 1-p A4C                 22.3  ml/m^2 ----------  LA volume, ES, 1-p A2C                     62.9  ml     ----------  LA volume/bsa, ES, 1-p A2C                 25.3  ml/m^2 ----------    Mitral valve                               Value        Reference  Mitral E-wave peak velocity                69.8  cm/s   ----------  Mitral A-wave peak velocity                83.9  cm/s   ----------  Mitral deceleration time           (H)     298   ms     150 - 230  Mitral E/A ratio, peak                     0.8          ----------  Mitral maximal regurg velocity,            244   cm/s   ----------  PISA    Tricuspid valve                            Value        Reference  Tricuspid regurg peak  velocity             100   cm/s   ----------  Tricuspid peak RV-RA gradient              4     mm Hg  ----------    Right atrium                               Value        Reference  RA ID, S-I, ES, A4C                        48.1  mm     34 - 49  RA area, ES, A4C                           14    cm^2   8.3 - 19.5  RA volume, ES, A/L                          33.3  ml     ----------  RA volume/bsa, ES, A/L                     13.4  ml/m^2 ----------    Right ventricle                            Value        Reference  TAPSE                                      17.4  mm     ----------  RV s&', lateral, S                          10.3  cm/s   ----------  Legend: (L)  and  (H)  mark values outside specified reference range.  ------------------------------------------------------------------- Prepared and Electronically Authenticated by  Rozann Lesches, M.D. 2019-10-31T15:25:23  Assessment  The primary encounter diagnosis was Chronic pain syndrome. Diagnoses of Chronic knee pain (Primary Area of Pain) (Bilateral) (L>R), Chronic low back pain (Secondary area of Pain) (Bilateral) (L>R), Chronic lower extremity pain (Third area of Pain) (Bilateral) (R>L), Pharmacologic therapy, Disorder of skeletal system, and Problems influencing health status were also pertinent to this visit.  Plan of Care  I have changed Bernard Hauschild's oxyCODONE, oxyCODONE, and oxyCODONE. I am also having him maintain his Fluticasone-Salmeterol, tiotropium, metFORMIN, canagliflozin, insulin aspart, alprazolam, atorvastatin, citalopram, aspirin EC, nitroGLYCERIN, meloxicam, amLODipine, gabapentin, cloNIDine, sacubitril-valsartan, albuterol, naloxone, gabapentin, amphetamine-dextroamphetamine, fluticasone, carvedilol, pantoprazole, Melatonin, Vitamin D3, Levemir FlexTouch, naloxone, Ipratropium-Albuterol, fenofibrate, and Magnesium Oxide.  Pharmacotherapy (Medications Ordered): Meds ordered this encounter  Medications  . oxyCODONE (ROXICODONE) 15 MG immediate release tablet    Sig: Take 1 tablet (15 mg total) by mouth every 6 (six) hours as needed for up to 30 days for pain. Must last 30 days    Dispense:  120 tablet    Refill:  0    Chronic Pain: STOP Act - Not applicable. Fill 1 day early if closed on scheduled refill date. Do not fill until:  08/27/2018. To last until: 09/26/2018. Instruct to avoid benzodiazepines within 8  hours of opioid.  Mar[place] Kitchen oxyCODONE (ROXICODONE) 15 MG immediate release tablet    Sig: Take 1 tablet (15 mg total) by mouth every 6 (six) hours as needed for up to 30 days for pain. Must last 30 days    Dispense:  120 tablet    Refill:  0    Chronic Pain: STOP Act - Not applicable. Fill 1 day early if closed on scheduled refill date. Do not fill until: 07/28/2018. To last until: 08/27/2018. Instruct to avoid benzodiazepines within 8 hours of opioid.  Mar[place] Kitchen oxyCODONE (ROXICODONE) 15 MG immediate release tablet    Sig: Take 1 tablet (15 mg total) by mouth every 6 (six) hours as needed for up to 30 days for pain. Must last 30 days    Dispense:  120 tablet    Refill:  0    Chronic Pain: STOP Act - Not applicable. Fill 1 day early if closed on scheduled refill date. Do not fill until: 06/28/2018. To last until: 07/28/2018. Instruct to avoid benzodiazepines within 8 hours of opioid.   Orders:  Orders Placed This Encounter  Procedures  . ToxASSURE Select 13 (MW), Urine    Volume: 30 ml(s). Minimum 3 ml of urine is needed. Document temperature of fresh sample. Indications: Long term (current) use of opiate analgesic (Z79.891)  . Comp. Metabolic Panel (12)    With GFR. Indications: Chronic Pain Syndrome (G89.4) & Pharmacotherapy (Y63.785)    Order Specific Question:   Has the patient fasted?    Answer:   No    Order Specific Question:   CC Results    Answer:   PCP-NURSE [[id number]]  . Magnesium    Indication: Pharmacologic therapy (X41.287)    Order Specific Question:   CC Results    Answer:   PCP-NURSE [[id number]]  . Vitamin B12    Indication: Pharmacologic therapy (C94.709).    Order Specific Question:   CC Results    Answer:   PCP-NURSE [[id number]]  . Sedimentation rate    Indication: Disorder of skeletal system (M89.9)    Order Specific Question:   CC Results    Answer:   PCP-NURSE [[id number]]  . 25-Hydroxyvitamin D Lcms D2+D3     Indication: Disorder of skeletal system (M89.9).    Order Specific Question:   CC Results    Answer:   PCP-NURSE [[id number]]  . C-reactive protein    Indication: Problems influencing health status (Z78.9)    Order Specific Question:   CC Results    Answer:   PCP-NURSE [[id number]]   Follow-up plan:   Return in about 3 months (around 09/21/2018) for (3 mo) Med-Mgmt, (Virtual Visit).    I discussed the assessment and treatment plan with the patient. The patient was provided an opportunity to ask questions and all were answered. The patient agreed with the plan and demonstrated an understanding of the instructions.  Patient advised to call back or seek an in-person evaluation if the symptoms or condition worsens.  Total duration of non-face-to-face encounter: 12 minutes.  Note by: Gaspar Cola, MD Date: 06/16/2018; Time: 2:19 PM  Note: This dictation was prepared with Dragon dictation. Any transcriptional errors that may result from this process are unintentional.  Disclaimer:  * Given the special circumstances of the COVID-19 pandemic, the federal government has announced that the Office for Civil Rights (OCR) will exercise its enforcement discretion and will not impose penalties on physicians using telehealth in the event of noncompliance with regulatory requirements under the [place]  and Accountability Act (HIPAA) in connection with the good faith provision of telehealth during the WGNFA-21 national public health emergency. (AMA)

## 2018-06-16 NOTE — Patient Instructions (Signed)
____________________________________________________________________________________________  Medication Rules  Purpose: To inform patients, and their family members, of our rules and regulations.  Applies to: All patients receiving prescriptions (written or electronic).  Pharmacy of record: Pharmacy where electronic prescriptions will be sent. If written prescriptions are taken to a different pharmacy, please inform the nursing staff. The pharmacy listed in the electronic medical record should be the one where you would like electronic prescriptions to be sent.  Electronic prescriptions: In compliance with the [place] Strengthen Opioid Misuse Prevention (STOP) Act of 2017 (Session Law 2017-74/H243), effective January 12, 2018, all controlled substances must be electronically prescribed. Calling prescriptions to the pharmacy will cease to exist.  Prescription refills: Only during scheduled appointments. Applies to all prescriptions.  NOTE: The following applies primarily to controlled substances (Opioid* Pain Medications).   Patient's responsibilities: 1. Pain Pills: Bring all pain pills to every appointment (except for procedure appointments). 2. Pill Bottles: Bring pills in original pharmacy bottle. Always bring the newest bottle. Bring bottle, even if empty. 3. Medication refills: You are responsible for knowing and keeping track of what medications you take and those you need refilled. The day before your appointment: write a list of all prescriptions that need to be refilled. The day of the appointment: give the list to the admitting nurse. Prescriptions will be written only during appointments. No prescriptions will be written on procedure days. If you forget a medication: it will not be "Called in", "Faxed", or "electronically sent". You will need to get another appointment to get these prescribed. No early refills. Do not call asking to have your prescription filled  early. 4. Prescription Accuracy: You are responsible for carefully inspecting your prescriptions before leaving our office. Have the discharge nurse carefully go over each prescription with you, before taking them home. Make sure that your name is accurately spelled, that your address is correct. Check the name and dose of your medication to make sure it is accurate. Check the number of pills, and the written instructions to make sure they are clear and accurate. Make sure that you are given enough medication to last until your next medication refill appointment. 5. Taking Medication: Take medication as prescribed. When it comes to controlled substances, taking less pills or less frequently than prescribed is permitted and encouraged. Never take more pills than instructed. Never take medication more frequently than prescribed.  6. Inform other Doctors: Always inform, all of your healthcare providers, of all the medications you take. 7. Pain Medication from other Providers: You are not allowed to accept any additional pain medication from any other Doctor or Healthcare provider. There are two exceptions to this rule. (see below) In the event that you require additional pain medication, you are responsible for notifying us, as stated below. 8. Medication Agreement: You are responsible for carefully reading and following our Medication Agreement. This must be signed before receiving any prescriptions from our practice. Safely store a copy of your signed Agreement. Violations to the Agreement will result in no further prescriptions. (Additional copies of our Medication Agreement are available upon request.) 9. Laws, Rules, & Regulations: All patients are expected to follow all Federal and State Laws, Statutes, Rules, & Regulations. Ignorance of the Laws does not constitute a valid excuse. The use of any illegal substances is prohibited. 10. Adopted CDC guidelines & recommendations: Target dosing levels will be  at or below 60 MME/day. Use of benzodiazepines** is not recommended.  Exceptions: There are only two exceptions to the rule of not   receiving pain medications from other Healthcare Providers. 1. Exception #1 (Emergencies): In the event of an emergency (i.e.: accident requiring emergency care), you are allowed to receive additional pain medication. However, you are responsible for: As soon as you are able, call our office [phone], at any time of the day or night, and leave a message stating your name, the date and nature of the emergency, and the name and dose of the medication prescribed. In the event that your call is answered by a member of our staff, make sure to document and save the date, time, and the name of the person that took your information.  2. Exception #2 (Planned Surgery): In the event that you are scheduled by another doctor or dentist to have any type of surgery or procedure, you are allowed (for a period no longer than 30 days), to receive additional pain medication, for the acute post-op pain. However, in this case, you are responsible for picking up a copy of our "Post-op Pain Management for Surgeons" handout, and giving it to your surgeon or dentist. This document is available at our office, and does not require an appointment to obtain it. Simply go to our office during business hours (Monday-Thursday from 8:00 AM to 4:00 PM) (Friday 8:00 AM to 12:00 Noon) or if you have a scheduled appointment with us, prior to your surgery, and ask for it by name. In addition, you will need to provide us with your name, name of your surgeon, type of surgery, and date of procedure or surgery.  *Opioid medications include: morphine, codeine, oxycodone, oxymorphone, hydrocodone, hydromorphone, meperidine, tramadol, tapentadol, buprenorphine, fentanyl, methadone. **Benzodiazepine medications include: diazepam (Valium), alprazolam (Xanax), clonazepam (Klonopine), lorazepam (Ativan), clorazepate  (Tranxene), chlordiazepoxide (Librium), estazolam (Prosom), oxazepam (Serax), temazepam (Restoril), triazolam (Halcion) (Last updated: 03/11/2017) ____________________________________________________________________________________________   ____________________________________________________________________________________________  Medication Recommendations and Reminders  Applies to: All patients receiving prescriptions (written and/or electronic).  Medication Rules & Regulations: These rules and regulations exist for your safety and that of others. They are not flexible and neither are we. Dismissing or ignoring them will be considered "non-compliance" with medication therapy, resulting in complete and irreversible termination of such therapy. (See document titled "Medication Rules" for more details.) In all conscience, because of safety reasons, we cannot continue providing a therapy where the patient does not follow instructions.  Pharmacy of record:   Definition: This is the pharmacy where your electronic prescriptions will be sent.   We do not endorse any particular pharmacy.  You are not restricted in your choice of pharmacy.  The pharmacy listed in the electronic medical record should be the one where you want electronic prescriptions to be sent.  If you choose to change pharmacy, simply notify our nursing staff of your choice of new pharmacy.  Recommendations:  Keep all of your pain medications in a safe place, under lock and key, even if you live alone.   After you fill your prescription, take 1 week's worth of pills and put them away in a safe place. You should keep a separate, properly labeled bottle for this purpose. The remainder should be kept in the original bottle. Use this as your primary supply, until it runs out. Once it's gone, then you know that you have 1 week's worth of medicine, and it is time to come in for a prescription refill. If you do this correctly, it  is unlikely that you will ever run out of medicine.  To make sure that the above recommendation works,   it is very important that you make sure your medication refill appointments are scheduled at least 1 week before you run out of medicine. To do this in an effective manner, make sure that you do not leave the office without scheduling your next medication management appointment. Always ask the nursing staff to show you in your prescription , when your medication will be running out. Then arrange for the receptionist to get you a return appointment, at least 7 days before you run out of medicine. Do not wait until you have 1 or 2 pills left, to come in. This is very poor planning and does not take into consideration that we may need to cancel appointments due to bad weather, sickness, or emergencies affecting our staff.  "Partial Fill": If for any reason your pharmacy does not have enough pills/tablets to completely fill or refill your prescription, do not allow for a "partial fill". You will need a separate prescription to fill the remaining amount, which we will not provide. If the reason for the partial fill is your insurance, you will need to talk to the pharmacist about payment alternatives for the remaining tablets, but again, do not accept a partial fill.  Prescription refills and/or changes in medication(s):   Prescription refills, and/or changes in dose or medication, will be conducted only during scheduled medication management appointments. (Applies to both, written and electronic prescriptions.)  No refills on procedure days. No medication will be changed or started on procedure days. No changes, adjustments, and/or refills will be conducted on a procedure day. Doing so will interfere with the diagnostic portion of the procedure.  No phone refills. No medications will be "called into the pharmacy".  No Fax refills.  No weekend refills.  No Holliday refills.  No after hours  refills.  Remember:  Business hours are:  Monday to Thursday 8:00 AM to 4:00 PM Provider's Schedule: Crystal King, NP - Appointments are:  Medication management: Monday to Thursday 8:00 AM to 4:00 PM Naelani Lafrance, MD - Appointments are:  Medication management: Monday and Wednesday 8:00 AM to 4:00 PM Procedure day: Tuesday and Thursday 7:30 AM to 4:00 PM Bilal Lateef, MD - Appointments are:  Medication management: Tuesday and Thursday 8:00 AM to 4:00 PM Procedure day: Monday and Wednesday 7:30 AM to 4:00 PM (Last update: 03/11/2017) ____________________________________________________________________________________________   ____________________________________________________________________________________________  CANNABIDIOL (AKA: CBD Oil or Pills)  Applies to: All patients receiving prescriptions of controlled substances (written and/or electronic).  General Information: Cannabidiol (CBD) was discovered in 1940. It is one of some 113 identified cannabinoids in cannabis (Marijuana) plants, accounting for up to 40% of the plant's extract. As of 2018, preliminary clinical research on cannabidiol included studies of anxiety, cognition, movement disorders, and pain.  Cannabidiol is consummed in multiple ways, including inhalation of cannabis smoke or vapor, as an aerosol spray into the cheek, and by mouth. It may be supplied as CBD oil containing CBD as the active ingredient (no added tetrahydrocannabinol (THC) or terpenes), a full-plant CBD-dominant hemp extract oil, capsules, dried cannabis, or as a liquid solution. CBD is thought not have the same psychoactivity as THC, and may affect the actions of THC. Studies suggest that CBD may interact with different biological targets, including cannabinoid receptors and other neurotransmitter receptors. As of 2018 the mechanism of action for its biological effects has not been determined.  In the [place], cannabidiol has a limited  approval by the Food and Drug Administration (FDA) for treatment of only two types   of epilepsy disorders. The side effects of long-term use of the drug include somnolence, decreased appetite, diarrhea, fatigue, malaise, weakness, sleeping problems, and others.  CBD remains a Schedule I drug prohibited for any use.  Legality: Some manufacturers ship CBD products nationally, an illegal action which the FDA has not enforced in 2018, with CBD remaining the subject of an FDA investigational new drug evaluation, and is not considered legal as a dietary supplement or food ingredient as of December 2018. Federal illegality has made it difficult historically to conduct research on CBD. CBD is openly sold in head shops and health food stores in some states where such sales have not been explicitly legalized.  Warning: Because it is not FDA approved for general use or treatment of pain, it is not required to undergo the same manufacturing controls as prescription drugs.  This means that the available cannabidiol (CBD) may be contaminated with THC.  If this is the case, it will trigger a positive urine drug screen (UDS) test for cannabinoids (Marijuana).  Because a positive UDS for illicit substances is a violation of our medication agreement, your opioid analgesics (pain medicine) may be permanently discontinued. (Last update: 04/01/2017) ____________________________________________________________________________________________    

## 2018-06-23 ENCOUNTER — Ambulatory Visit: Payer: Medicaid Other | Admitting: "Endocrinology

## 2018-06-24 ENCOUNTER — Other Ambulatory Visit
Admission: RE | Admit: 2018-06-24 | Discharge: 2018-06-24 | Disposition: A | Discharge status: Home / Self Care | Payer: Medicare Other | Source: Ambulatory Visit | Attending: Pain Medicine | Admitting: Pain Medicine

## 2018-06-24 DIAGNOSIS — M899 Disorder of bone, unspecified: Secondary | ICD-10-CM | POA: Insufficient documentation

## 2018-06-24 DIAGNOSIS — G894 Chronic pain syndrome: Secondary | ICD-10-CM | POA: Diagnosis present

## 2018-06-24 DIAGNOSIS — Z79899 Other long term (current) drug therapy: Secondary | ICD-10-CM | POA: Insufficient documentation

## 2018-06-24 DIAGNOSIS — Z789 Other specified health status: Secondary | ICD-10-CM | POA: Diagnosis not present

## 2018-06-24 LAB — COMPREHENSIVE METABOLIC PANEL
ALT: 28 U/L (ref 0–44)
AST: 22 U/L (ref 15–41)
Albumin: 4 g/dL (ref 3.5–5.0)
Alkaline Phosphatase: 51 U/L (ref 38–126)
Anion gap: 10 (ref 5–15)
BUN: 18 mg/dL (ref 6–20)
CO2: 25 mmol/L (ref 22–32)
Calcium: 8.8 mg/dL — ABNORMAL LOW (ref 8.9–10.3)
Chloride: 100 mmol/L (ref 98–111)
Creatinine, Ser: 0.95 mg/dL (ref 0.61–1.24)
GFR calc Af Amer: >60 mL/min (ref >60)
GFR calc non Af Amer: >60 mL/min (ref >60)
Glucose, Bld: 370 mg/dL — ABNORMAL HIGH (ref 70–99)
Potassium: 5 mmol/L (ref 3.5–5.1)
Sodium: 135 mmol/L (ref 135–145)
Total Bilirubin: 0.4 mg/dL (ref 0.3–1.2)
Total Protein: 7 g/dL (ref 6.5–8.1)

## 2018-06-24 LAB — C-REACTIVE PROTEIN: CRP: <0.8 mg/dL (ref <1.0)

## 2018-06-24 LAB — MAGNESIUM: Magnesium: 1.8 mg/dL (ref 1.7–2.4)

## 2018-06-24 LAB — SEDIMENTATION RATE: Sed Rate: 5 mm/hr (ref 0–20)

## 2018-06-24 LAB — VITAMIN B12: Vitamin B-12: 368 pg/mL (ref 180–914)

## 2018-06-25 LAB — VITAMIN D 25 HYDROXY (VIT D DEFICIENCY, FRACTURES): Vit D, 25-Hydroxy: 39.7 ng/mL (ref 30.0–100.0)

## 2018-06-27 ENCOUNTER — Telehealth: Payer: Self-pay | Admitting: *Deleted

## 2018-06-27 NOTE — Telephone Encounter (Signed)
Spoke with pharmacist, he filled a day early in April, 2020. Patient informed that even if he fills it early, it must last 30 days.

## 2018-07-01 LAB — MISC LABCORP TEST (SEND OUT): Labcorp test code: 738526

## 2018-07-20 ENCOUNTER — Ambulatory Visit: Payer: Medicare Other | Admitting: Urology

## 2018-07-25 NOTE — Progress Notes (Signed)
Test: Blood sugar (Glucose levels) Finding(s): High (hyperglycemia) Normal Level(s): Normal fasting (NPO x 8 hours) glucose levels are between 65-99 mg/dl, with 2 hour fasting, levels are usually less than 140 mg/dl. Clinical significance: Any random blood glucose level greater than 200 mg/dl is considered to be Diabetes. Signs and symptoms may include: (when persistently above 180 mg/dL) Increased thirst; headaches; trouble concentrating; blurred vision; frequent peeing (urination); fatigue (weakness and tired feeling); weight loss. The most common and classical symptoms of an undiagnosed diabetes with hyperglycemia are: Increased urinary frequency (polyuria), thirst (polydipsia), hunger (polyphagia), and unexplained weight loss; numbness in the extremities, pain in feet (dysesthesias), fatigue, and blurred vision; recurrent or severe infections; loss of consciousness or severe nausea/vomiting (ketoacidosis) or coma. Patient Recommendation(s): Fasting levels above 140 mg/dL or any levels above 200 mg/dL should follow-up with PCP (Primary Care Physician) for further evaluation. ___________________________________________________________________________________ Normal Calcium (Ca+) Level(s): Levels between 9.0 and 10.5 mg/dL Low level(s): Levels below 9.0 mg/dL (hypocalcemia) Clinical significance: High levels may cause bone pain, muscle twitching and/or weakness. Signs and symptoms may include: Hypocalcemia may be associated with numbness and tingling sensations around the mouth, anger, and toes. Muscle cramps, particularly in the back and lower extremities. Difficulty swallowing. Voice changes (due to laryngospasm). Wheezing due to bronchospasm. If irritability, impaired intellectual capacity, depression, personality changes, fatigue, seizures and uncontrolled movements, coarse hair, brittle nails, psoriasis, dry skin, chronic itching, poor dentition, and cataracts. Possible causes:  - Most common: Low  blood protein levels, especially a low level of albumin, which can result from liver disease or malnutrition, both of which may result from alcoholism or other illnesses. Low albumin is also very common in people who are acutely ill. With low albumin, only the bound calcium is low. Ionized calcium remains normal, and calcium metabolism is being regulated appropriately. - Other: Underactive parathyroid gland (hypoparathyroidism); Inherited resistance to the effects of parathyroid hormone; Extreme deficiency in dietary calcium; Decreased levels of vitamin D; Magnesium deficiency; Increased levels of phosphorus; Acute inflammation of the pancreas (pancreatitis); & Renal failure. Recommendations: - Contact primary care physician for further evaluation and recommendations. - Oral calcium gluconate 650 to 1200 mg every day in a.m. with magnesium 500 mg and vitamin D 2000 IU (international units), every morning. _____________________________________________________________________________________________

## 2018-08-03 ENCOUNTER — Telehealth: Payer: Self-pay | Admitting: Cardiovascular Disease

## 2018-08-03 NOTE — Telephone Encounter (Signed)
Virtual Visit Pre-Appointment Phone Call  "(Name), I am calling you today to discuss your upcoming appointment. We are currently trying to limit exposure to the virus that causes COVID-19 by seeing patients at home rather than in the office."  1. "What is the BEST phone number to call the day of the visit?" - include this in appointment notes  2. Do you have or have access to (through a family member/friend) a smartphone with video capability that we can use for your visit?" a. If yes - list this number in appt notes as cell (if different from BEST phone #) and list the appointment type as a VIDEO visit in appointment notes b. If no - list the appointment type as a PHONE visit in appointment notes  3. Confirm consent - "In the setting of the current Covid19 crisis, you are scheduled for a (phone or video) visit with your provider on (date) at (time).  Just as we do with many in-office visits, in order for you to participate in this visit, we must obtain consent.  If you'd like, I can send this to your mychart (if signed up) or email for you to review.  Otherwise, I can obtain your verbal consent now.  All virtual visits are billed to your insurance company just like a normal visit would be.  By agreeing to a virtual visit, we'd like you to understand that the technology does not allow for your provider to perform an examination, and thus may limit your provider's ability to fully assess your condition. If your provider identifies any concerns that need to be evaluated in person, we will make arrangements to do so.  Finally, though the technology is pretty good, we cannot assure that it will always work on either your or our end, and in the setting of a video visit, we may have to convert it to a phone-only visit.  In either situation, we cannot ensure that we have a secure connection.  Are you willing to proceed?" STAFF: Did the patient verbally acknowledge consent to telehealth visit? Document  YES/NO here: yes  4. Advise patient to be prepared - "Two hours prior to your appointment, go ahead and check your blood pressure, pulse, oxygen saturation, and your weight (if you have the equipment to check those) and write them all down. When your visit starts, your provider will ask you for this information. If you have an Apple Watch or Kardia device, please plan to have heart rate information ready on the day of your appointment. Please have a pen and paper handy nearby the day of the visit as well."  5. Give patient instructions for MyChart download to smartphone OR Doximity/Doxy.me as below if video visit (depending on what platform provider is using)  6. Inform patient they will receive a phone call 15 minutes prior to their appointment time (may be from unknown caller ID) so they should be prepared to answer    TELEPHONE CALL NOTE  Manuel Knight has been deemed a candidate for a follow-up tele-health visit to limit community exposure during the Covid-19 pandemic. I spoke with the patient via phone to ensure availability of phone/video source, confirm preferred email & phone number, and discuss instructions and expectations.  I reminded Manuel Knight to be prepared with any vital sign and/or heart rhythm information that could potentially be obtained via home monitoring, at the time of his visit. I reminded Manuel Knight to expect a phone call prior to his visit.  Manuel Knight 08/03/2018 11:13 AM   INSTRUCTIONS FOR DOWNLOADING THE MYCHART APP TO SMARTPHONE  - The patient must first make sure to have activated MyChart and know their login information - If Apple, go to [employer] and type in MyChart in the search bar and download the app. If Android, ask patient to go to [employer] and type in [place] in the search bar and download the app. The app is free but as with any other app downloads, their phone may require them to verify saved payment information or  Apple/Android password.  - The patient will need to then log into the app with their MyChart username and password, and select [facility] as their healthcare provider to link the account. When it is time for your visit, go to the MyChart app, find appointments, and click Begin Video Visit. Be sure to Select Allow for your device to access the Microphone and Camera for your visit. You will then be connected, and your provider will be with you shortly.  **If they have any issues connecting, or need assistance please contact MyChart service desk [phone])**  **If using a computer, in order to ensure the best quality for their visit they will need to use either of the following Internet Browsers: [employer], or Google Chrome**  IF USING DOXIMITY or DOXY.ME - The patient will receive a link just prior to their visit by text.     FULL LENGTH CONSENT FOR TELE-HEALTH VISIT   I hereby voluntarily request, consent and authorize [facility] and its employed or contracted physicians, physician assistants, nurse practitioners or other licensed health care professionals (the Practitioner), to provide me with telemedicine health care services (the Services") as deemed necessary by the treating Practitioner. I acknowledge and consent to receive the Services by the Practitioner via telemedicine. I understand that the telemedicine visit will involve communicating with the Practitioner through live audiovisual communication technology and the disclosure of certain medical information by electronic transmission. I acknowledge that I have been given the opportunity to request an in-person assessment or other available alternative prior to the telemedicine visit and am voluntarily participating in the telemedicine visit.  I understand that I have the right to withhold or withdraw my consent to the use of telemedicine in the course of my care at any time, without affecting my right to future care  or treatment, and that the Practitioner or I may terminate the telemedicine visit at any time. I understand that I have the right to inspect all information obtained and/or recorded in the course of the telemedicine visit and may receive copies of available information for a reasonable fee.  I understand that some of the potential risks of receiving the Services via telemedicine include:   Delay or interruption in medical evaluation due to technological equipment failure or disruption;  Information transmitted may not be sufficient (e.g. poor resolution of images) to allow for appropriate medical decision making by the Practitioner; and/or   In rare instances, security protocols could fail, causing a breach of personal health information.  Furthermore, I acknowledge that it is my responsibility to provide information about my medical history, conditions and care that is complete and accurate to the best of my ability. I acknowledge that Practitioner's advice, recommendations, and/or decision may be based on factors not within their control, such as incomplete or inaccurate data provided by me or distortions of diagnostic images or specimens that may result from electronic transmissions. I understand that the  practice of medicine is not an Chief Strategy Officer and that Practitioner makes no warranties or guarantees regarding treatment outcomes. I acknowledge that I will receive a copy of this consent concurrently upon execution via email to the email address I last provided but may also request a printed copy by calling the office of [facility].    I understand that my insurance will be billed for this visit.   I have read or had this consent read to me.  I understand the contents of this consent, which adequately explains the benefits and risks of the Services being provided via telemedicine.   I have been provided ample opportunity to ask questions regarding this consent and the Services and have had  my questions answered to my satisfaction.  I give my informed consent for the services to be provided through the use of telemedicine in my medical care  By participating in this telemedicine visit I agree to the above.

## 2018-08-10 ENCOUNTER — Encounter: Payer: Self-pay | Admitting: Cardiovascular Disease

## 2018-08-10 ENCOUNTER — Encounter: Payer: Self-pay | Admitting: *Deleted

## 2018-08-10 ENCOUNTER — Telehealth (INDEPENDENT_AMBULATORY_CARE_PROVIDER_SITE_OTHER): Payer: Medicare Other | Admitting: Cardiovascular Disease

## 2018-08-10 VITALS — Ht 69.5 in | Wt 258.0 lb

## 2018-08-10 DIAGNOSIS — I25708 Atherosclerosis of coronary artery bypass graft(s), unspecified, with other forms of angina pectoris: Secondary | ICD-10-CM | POA: Diagnosis not present

## 2018-08-10 DIAGNOSIS — I255 Ischemic cardiomyopathy: Secondary | ICD-10-CM

## 2018-08-10 DIAGNOSIS — E785 Hyperlipidemia, unspecified: Secondary | ICD-10-CM

## 2018-08-10 DIAGNOSIS — I10 Essential (primary) hypertension: Secondary | ICD-10-CM

## 2018-08-10 DIAGNOSIS — R002 Palpitations: Secondary | ICD-10-CM

## 2018-08-10 NOTE — Patient Instructions (Signed)

## 2018-08-10 NOTE — Progress Notes (Signed)
Virtual Visit via Telephone Note   This visit type was conducted due to national recommendations for restrictions regarding the COVID-19 Pandemic (e.g. social distancing) in an effort to limit this patient's exposure and mitigate transmission in our community.  Due to his co-morbid illnesses, this patient is at least at moderate risk for complications without adequate follow up.  This format is felt to be most appropriate for this patient at this time.  The patient did not have access to video technology/had technical difficulties with video requiring transitioning to audio format only (telephone).  All issues noted in this document were discussed and addressed.  No physical exam could be performed with this format.  Please refer to the patient's chart for his  consent to telehealth for [facility].   Date:  08/10/2018   ID:  Manuel Knight, DOB 03/27/1960, MRN [id number]  Patient Location: Home Provider Location: Office  PCP:  [facility]  Cardiologist:  Kate Sable, MD  Electrophysiologist:  None   Evaluation Performed:  Follow-Up Visit  Chief Complaint:  CAD  History of Present Illness:    Manuel Knight is a 58 y.o. male with a history of diabetes, hyperlipidemia, hypertension, COPD, and CAD. He sustained a non-ST elevation myocardial infarction and ultimately underwent 3-vessel coronary artery bypass graft surgery on 02/06/13, with a LIMA to the LAD, SVG to OM1, and SVG to RCA. Left ventriculography revealed an EF of 30% and LVEDP was 20 mmHg.  He has chronic back pain and left knee pain and sees pain management once every 3 months.  He denies chest pain.  He has episodic shortness of breath due to COPD particularly when the weather is very hot and humid.  He denies orthopnea, palpitations, and leg swelling.  The patient does not have symptoms concerning for COVID-19 infection (fever, chills, cough, or new shortness of breath).    Past Medical  History:  Diagnosis Date  . Anxiety   . Arthritis    RA  . Cellulitis 2011   of neck   . Chronic back pain   . Claustrophobia   . COPD (chronic obstructive pulmonary disease) ([place])   . Diabetes mellitus, type 2 ([place])   . Diverticulitis of colon 2248   complicated by abscess  . GERD (gastroesophageal reflux disease)   . Heart attack ([place]) 2017  . History of kidney stones   . Hyperlipidemia   . Hypertension   . Obesity   . Pulmonary nodule    Past Surgical History:  Procedure Laterality Date  . APPENDECTOMY  2007   gangrenous appendicitis   . CORONARY ARTERY BYPASS GRAFT N/A 02/06/2013   Procedure: CORONARY ARTERY BYPASS GRAFTING (CABG) with TEE;  Surgeon: [name], MD;  Location: [facility] OR;  Service: Open Heart Surgery;  Laterality: N/A;  CABG x three,  using left internal mammary artery and right leg greater saphenous vein harvested endoscopically  . LEFT HEART CATHETERIZATION WITH CORONARY ANGIOGRAM N/A 02/03/2013   Procedure: LEFT HEART CATHETERIZATION WITH CORONARY ANGIOGRAM;  Surgeon: Jettie Booze, MD;  Location: [facility] CATH LAB;  Service: Cardiovascular;  Laterality: N/A;  . TONSILLECTOMY       Current Meds  Medication Sig  . alprazolam (XANAX) 2 MG tablet Take 2 mg by mouth 3 (three) times daily.   Marland Kitchen amLODipine (NORVASC) 10 MG tablet Take 10 mg by mouth daily.  Marland Kitchen amphetamine-dextroamphetamine (ADDERALL) 30 MG tablet Take 30 mg by mouth 3 (three) times daily.   Marland Kitchen  aspirin EC 81 MG tablet Take 1 tablet (81 mg total) by mouth daily.  Marland Kitchen atorvastatin (LIPITOR) 80 MG tablet Take 80 mg by mouth daily.   . Canagliflozin (INVOKANA) 100 MG TABS Take 100 mg by mouth daily.  . carvedilol (COREG) 12.5 MG tablet Take 12.5 mg by mouth 2 (two) times daily.  . Cholecalciferol (VITAMIN D3) 2000 units capsule Take 2,000 Units by mouth daily.  . citalopram (CELEXA) 20 MG tablet Take 20 mg by mouth daily.   . cloNIDine (CATAPRES) 0.3 MG tablet Take 0.3 mg by mouth 2 (two) times daily.   . fenofibrate 160 MG tablet Take 1 tablet by mouth daily.  . fluticasone (FLONASE) 50 MCG/ACT nasal spray Place 1-2 sprays into both nostrils 2 (two) times daily as needed (for allergies.).   Marland Kitchen Fluticasone-Salmeterol (ADVAIR DISKUS) 500-50 MCG/DOSE AEPB Inhale 1 puff into the lungs every 12 (twelve) hours.    . gabapentin (NEURONTIN) 300 MG capsule TAKE ONE CAPSULE BY MOUTH THREE TIMES DAILY. (TAKE WITH 600MG  TABLET TO TOTAL 900MG )  . gabapentin (NEURONTIN) 600 MG tablet Take 600 mg by mouth 3 (three) times daily.  . insulin aspart (NOVOLOG FLEXPEN) 100 UNIT/ML FlexPen Inject 20 Units into the skin 3 (three) times daily. 20 units three times a day with sliding scale max of 26 units   . Ipratropium-Albuterol (COMBIVENT RESPIMAT) 20-100 MCG/ACT AERS respimat Inhale 1 puff into the lungs every 6 (six) hours.  Marland Kitchen LEVEMIR FLEXTOUCH 100 UNIT/ML Pen Inject 50 Units into the skin at bedtime.  . Melatonin 10 MG TABS Take 10 mg by mouth at bedtime.  . meloxicam (MOBIC) 15 MG tablet Take 15 mg by mouth 2 (two) times daily.  . metFORMIN (GLUCOPHAGE) 1000 MG tablet Take 1,000 mg by mouth 2 (two) times daily.   . naloxone (NARCAN) 2 MG/2ML injection Inject content of syringe into thigh muscle. Call 911.  . naloxone (NARCAN) nasal spray 4 mg/0.1 mL One spray in either nostril once for known/suspected opioid overdose. May repeat every 2-3 minutes in alternating nostril til EMS arrives  . nitroGLYCERIN (NITROSTAT) 0.4 MG SL tablet Place 1 tablet (0.4 mg total) under the tongue every 5 (five) minutes as needed for chest pain.  Derrill Memo ON 08/27/2018] oxyCODONE (ROXICODONE) 15 MG immediate release tablet Take 1 tablet (15 mg total) by mouth every 6 (six) hours as needed for up to 30 days for pain. Must last 30 days  . oxyCODONE (ROXICODONE) 15 MG immediate release tablet Take 1 tablet (15 mg total) by mouth every 6 (six) hours as needed for up to 30 days for pain. Must last 30 days  . pantoprazole (PROTONIX) 40 MG  tablet Take 40 mg by mouth 2 (two) times daily.  . sacubitril-valsartan (ENTRESTO) 97-103 MG Take 1 tablet by mouth 2 (two) times daily.  . [DISCONTINUED] albuterol (PROVENTIL) (2.5 MG/3ML) 0.083% nebulizer solution Inhale 2.5 mg into the lungs 3 (three) times daily.      Allergies:   Lodine [etodolac], Tylenol [acetaminophen], Iodine, and Propoxyphene n-acetaminophen   Social History   Tobacco Use  . Smoking status: Former Smoker    Packs/day: 1.50    Years: 19.00    Pack years: 28.50    Types: Cigarettes    Start date: 03/12/1967    Quit date: 11/12/1988    Years since quitting: 29.7  . Smokeless tobacco: Never Used  Substance Use Topics  . Alcohol use: No    Alcohol/week: 0.0 standard drinks    Comment:    .  Drug use: No     Family Hx: The patient's family history includes Cancer in his mother; Coronary artery disease in his mother and sister; Diabetes in his sister; Heart attack in his brother and father. There is no history of Colon cancer.  ROS:   Please see the history of present illness.     All other systems reviewed and are negative.   Prior CV studies:   The following studies were reviewed today:  Echocardiogram 11/11/2017:  - Left ventricle: The cavity size was mildly dilated. Wall   thickness was normal. The estimated ejection fraction was 55%.   There is hypokinesis of the basalinferior myocardium.   Indeterminate diastolic function. - Aortic valve: Mildly calcified annulus. Trileaflet. - Mitral valve: Mildly calcified annulus. There was trivial   regurgitation. - Tricuspid valve: There was trivial regurgitation. - Pulmonary arteries: Systolic pressure could not be accurately   estimated. - Pericardium, extracardiac: A prominent pericardial fat pad was   present.   Labs/Other Tests and Data Reviewed:    EKG:  No ECG reviewed.  Recent Labs: 06/24/2018: ALT 28; BUN 18; Creatinine, Ser 0.95; Magnesium 1.8; Potassium 5.0; Sodium 135   Recent Lipid  Panel Lab Results  Component Value Date/Time   CHOL 183 03/13/2015 07:00 AM   TRIG 191 (H) 03/13/2015 07:00 AM   HDL 24 (L) 03/13/2015 07:00 AM   CHOLHDL 7.6 03/13/2015 07:00 AM   LDLCALC 121 (H) 03/13/2015 07:00 AM   LDLDIRECT 137 (H) 07/03/2009 03:00 PM    Wt Readings from Last 3 Encounters:  08/10/18 258 lb (117 kg)  03/15/18 258 lb (117 kg)  12/22/17 252 lb (114.3 kg)     Objective:    Vital Signs:  Ht 5' 9.5" (1.765 m)   Wt 258 lb (117 kg)   BMI 37.55 kg/m    VITAL SIGNS:  reviewed  ASSESSMENT & PLAN:    1. CAD s/p 3-vessel CABG: Symptomatically stable.  Continue ASA, atorvastatin, Entresto, and carvedilol.  2. Ischemic cardiomyopathy: LVEF had been 35% but demonstrated normalization by echocardiogram on 11/11/2017, LVEF now 55% with wall motion abnormalities noted.  Continue Entresto 97-103 mg bid and carvedilol. Normal renal function on 06/24/18.  3. Essential HTN: No changes to therapy.  4. Hyperlipidemia: Continue high dose Lipitor 80 mg daily. I will obtain a copy of lipids from PCP.    5.  Palpitations: I previously ordered a 3-week event monitor but this was not pursued.  He no longer has any palpitations.   COVID-19 Education: The signs and symptoms of COVID-19 were discussed with the patient and how to seek care for testing (follow up with PCP or arrange E-visit).  The importance of social distancing was discussed today.  Time:   Today, I have spent 5 minutes with the patient with telehealth technology discussing the above problems.     Medication Adjustments/Labs and Tests Ordered: Current medicines are reviewed at length with the patient today.  Concerns regarding medicines are outlined above.   Tests Ordered: No orders of the defined types were placed in this encounter.   Medication Changes: No orders of the defined types were placed in this encounter.   Follow Up:  Virtual Visit or In Person in 6 month(s)  Signed, Kate Sable, MD   08/10/2018 10:53 AM    [facility]

## 2018-08-24 ENCOUNTER — Ambulatory Visit: Payer: Medicare Other | Admitting: Urology

## 2018-09-13 ENCOUNTER — Encounter: Payer: Medicare Other | Admitting: Student in an Organized Health Care Education/Training Program

## 2018-09-13 ENCOUNTER — Telehealth: Payer: Self-pay

## 2018-09-13 NOTE — Telephone Encounter (Signed)
The patient returned the call to go over medicines for tomorrow's appointment. No one was available so I told him someone would call him back.

## 2018-09-14 ENCOUNTER — Ambulatory Visit: Payer: Medicare Other | Attending: Pain Medicine | Admitting: Pain Medicine

## 2018-09-14 ENCOUNTER — Telehealth: Payer: Self-pay

## 2018-09-14 ENCOUNTER — Other Ambulatory Visit: Payer: Self-pay

## 2018-09-14 DIAGNOSIS — M25562 Pain in left knee: Secondary | ICD-10-CM

## 2018-09-14 DIAGNOSIS — G894 Chronic pain syndrome: Secondary | ICD-10-CM

## 2018-09-14 DIAGNOSIS — M545 Low back pain, unspecified: Secondary | ICD-10-CM

## 2018-09-14 DIAGNOSIS — M25561 Pain in right knee: Secondary | ICD-10-CM | POA: Diagnosis not present

## 2018-09-14 DIAGNOSIS — F1190 Opioid use, unspecified, uncomplicated: Secondary | ICD-10-CM

## 2018-09-14 DIAGNOSIS — M79604 Pain in right leg: Secondary | ICD-10-CM | POA: Diagnosis not present

## 2018-09-14 DIAGNOSIS — M79605 Pain in left leg: Secondary | ICD-10-CM

## 2018-09-14 DIAGNOSIS — G8929 Other chronic pain: Secondary | ICD-10-CM

## 2018-09-14 MED ORDER — OXYCODONE HCL 15 MG PO TABS: 15.0000 mg | ORAL_TABLET | Freq: Four times a day (QID) | ORAL | 0 refills | Status: DC | PRN

## 2018-09-14 NOTE — Telephone Encounter (Signed)
Pt was called today and message left on answering service

## 2018-09-14 NOTE — Progress Notes (Signed)
Pain Management Virtual Encounter Note - Virtual Visit via Telephone Telehealth (real-time audio visits between healthcare provider and patient).   Patient's Phone No. & Preferred Pharmacy:  [phone] (home); [phone] (mobile); (Preferred) [phone] No e-mail address on record  [place], [place] - [address] [place] [address] Phone: [phone] Fax: [phone]    Pre-screening note:  Our staff contacted Mr. Maring and offered him an "in person", "face-to-face" appointment versus a telephone encounter. He indicated preferring the telephone encounter, at this time.   Reason for Virtual Visit: COVID-19*  Social distancing based on CDC and AMA recommendations.   I contacted Jesus Lapenta on 09/14/2018 via telephone.      I clearly identified myself as Gaspar Cola, MD. I verified that I was speaking with the correct person using two identifiers (Name: Kairee Amici, and date of birth: 1960/07/10).  Advanced Informed Consent I sought verbal advanced consent from [name] for virtual visit interactions. I informed Mr. Marshburn of possible security and privacy concerns, risks, and limitations associated with providing "not-in-person" medical evaluation and management services. I also informed Mr. Ding of the availability of "in-person" appointments. Finally, I informed him that there would be a charge for the virtual visit and that he could be  personally, fully or partially, financially responsible for it. Mr. Borke expressed understanding and agreed to proceed.   Historic Elements   Mr. Hossein Etris is a 58 y.o. year old, male patient evaluated today after his last encounter by our practice on 09/13/2018. Mr. Greeney  has a past medical history of Anxiety, Arthritis, Cellulitis (2011), Chronic back pain, Claustrophobia, COPD (chronic obstructive pulmonary disease) ([place]), Diabetes mellitus, type 2 ([place]), Diverticulitis of colon  (2008), GERD (gastroesophageal reflux disease), Heart attack ([place]) (2017), History of kidney stones, Hyperlipidemia, Hypertension, Obesity, and Pulmonary nodule. He also  has a past surgical history that includes Appendectomy (2007); Tonsillectomy; Coronary artery bypass graft (N/A, 02/06/2013); and left heart catheterization with coronary angiogram (N/A, 02/03/2013). Mr. Faillace has a current medication list which includes the following prescription(s): alprazolam, amlodipine, amphetamine-dextroamphetamine, aspirin ec, atorvastatin, canagliflozin, carvedilol, vitamin d3, citalopram, clonidine, fenofibrate, fluticasone, fluticasone-salmeterol, gabapentin, gabapentin, insulin aspart, ipratropium-albuterol, levemir flextouch, magnesium oxide, melatonin, meloxicam, metformin, naloxone, naloxone, nitroglycerin, oxycodone, oxycodone, oxycodone, pantoprazole, and sacubitril-valsartan. He  reports that he quit smoking about 29 years ago. His smoking use included cigarettes. He started smoking about 51 years ago. He has a 28.50 pack-year smoking history. He has never used smokeless tobacco. He reports that he does not drink alcohol or use drugs. Mr. Dinolfo is allergic to lodine [etodolac]; tylenol [acetaminophen]; iodine; and propoxyphene n-acetaminophen.   HPI  Today, he is being contacted for medication management.  The patient indicates doing well with the current medication regimen. No adverse reactions or side effects reported to the medications.   Pharmacotherapy Assessment  Analgesic: Oxycodone IR 15 mg 1 tablet p.o. every 6 hours (60 mg/day of oxycodone) MME/day: 90 mg/day.   Monitoring: Pharmacotherapy: No side-effects or adverse reactions reported. [place] PMP: PDMP reviewed during this encounter.       Compliance: No problems identified. Effectiveness: Clinically acceptable. Plan: Refer to "POC".  UDS:  Summary  Date Value Ref Range Status  03/15/2018 FINAL  Final    Comment:     ==================================================================== TOXASSURE SELECT 13 (MW) ==================================================================== Test  Result       Flag       Units Drug Present not Declared for Prescription Verification   Desmethyldiazepam              148          UNEXPECTED ng/mg creat   Oxazepam                       337          UNEXPECTED ng/mg creat   Temazepam                      581          UNEXPECTED ng/mg creat    Desmethyldiazepam, oxazepam, and temazepam are benzodiazepine    drugs, but may also be present as common metabolites of other    benzodiazepine drugs, including diazepam.   Alcohol, Ethyl                 0.038        UNEXPECTED g/dL    Sources of ethyl alcohol include alcoholic beverages or as a    fermentation product of glucose; glucose is present in this    specimen.  Interpret result with caution, as the presence of    ethyl alcohol is likely due, at least in part, to fermentation of    glucose. Drug Absent but Declared for Prescription Verification   Amphetamine                    Not Detected UNEXPECTED ng/mg creat   Alprazolam                     Not Detected UNEXPECTED ng/mg creat   Oxycodone                      Not Detected UNEXPECTED ng/mg creat ==================================================================== Test                      Result    Flag   Units      Ref Range   Creatinine              54               mg/dL      >=20 ==================================================================== Declared Medications:  The flagging and interpretation on this report are based on the  following declared medications.  Unexpected results may arise from  inaccuracies in the declared medications.  **Note: The testing scope of this panel includes these medications:  Alprazolam (Xanax)  Amphetamine (Adderall)  Oxycodone (Roxicodone)  **Note: The testing scope of this panel does not include  following  reported medications:  Albuterol  Albuterol (Combivent)  Amlodipine (Norvasc)  Aspirin (Aspirin 81)  Atorvastatin (Lipitor)  Canagliflozin (Invokana)  Carvedilol (Coreg)  Citalopram (Celexa)  Clonidine (Catapres)  Fenofibrate  Fluticasone (Advair)  Fluticasone (Flonase)  Gabapentin  Insulin (Levemir)  Insulin (NovoLog)  Ipratropium (Combivent)  Magnesium (Mag-Ox)  Melatonin  Meloxicam (Mobic)  Metformin (Glucophage)  Naloxone (Narcan)  Nitroglycerin  Pantoprazole (Protonix)  Sacubitril (Entresto)  Salmeterol (Advair)  Tiotropium (Spiriva)  Valsartan (Entresto)  Vitamin D3 ==================================================================== For clinical consultation, please call [phone]. ====================================================================    Laboratory Chemistry Profile (12 mo)  Renal: 06/24/2018: BUN 18; Creatinine, Ser 0.95  Lab Results  Component Value Date   GFRAA >60 06/24/2018   GFRNONAA >60 06/24/2018  Hepatic: 06/24/2018: Albumin 4.0 Lab Results  Component Value Date   AST 22 06/24/2018   ALT 28 06/24/2018   Other: 06/24/2018: CRP <0.8; Sed Rate 5; Vit D, 25-Hydroxy 39.7; Vitamin B-12 368 Note: Above Lab results reviewed.  Imaging  Last 90 days:  No results found.  Assessment  The primary encounter diagnosis was Chronic pain syndrome. Diagnoses of Chronic knee pain (Primary Area of Pain) (Bilateral) (L>R), Chronic low back pain (Secondary area of Pain) (Bilateral) (L>R), Chronic lower extremity pain (Third area of Pain) (Bilateral) (R>L), and Opiate use (90 MME/Day) were also pertinent to this visit.  Plan of Care  I have discontinued Gates Macht's oxyCODONE and oxyCODONE. I have also changed his oxyCODONE. Additionally, I am having him start on oxyCODONE and oxyCODONE. Lastly, I am having him maintain his Fluticasone-Salmeterol, metFORMIN, canagliflozin, insulin aspart, alprazolam, atorvastatin, citalopram,  aspirin EC, nitroGLYCERIN, meloxicam, amLODipine, gabapentin, cloNIDine, sacubitril-valsartan, naloxone, gabapentin, amphetamine-dextroamphetamine, fluticasone, carvedilol, pantoprazole, Melatonin, Vitamin D3, Levemir FlexTouch, naloxone, Ipratropium-Albuterol, fenofibrate, and Magnesium Oxide.  Pharmacotherapy (Medications Ordered): Meds ordered this encounter  Medications  . oxyCODONE (ROXICODONE) 15 MG immediate release tablet    Sig: Take 1 tablet (15 mg total) by mouth every 6 (six) hours as needed for pain. Must last 30 days    Dispense:  120 tablet    Refill:  0    Chronic Pain: STOP Act (Not applicable) Fill 1 day early if closed on refill date. Do not fill until: 09/26/2018. To last until: 10/26/2018. Avoid benzodiazepines within 8 hours of opioids  . oxyCODONE (ROXICODONE) 15 MG immediate release tablet    Sig: Take 1 tablet (15 mg total) by mouth every 6 (six) hours as needed for pain. Must last 30 days    Dispense:  120 tablet    Refill:  0    Chronic Pain: STOP Act (Not applicable) Fill 1 day early if closed on refill date. Do not fill until: 10/26/2018. To last until: 11/25/2018. Avoid benzodiazepines within 8 hours of opioids  . oxyCODONE (ROXICODONE) 15 MG immediate release tablet    Sig: Take 1 tablet (15 mg total) by mouth every 6 (six) hours as needed for pain. Must last 30 days    Dispense:  120 tablet    Refill:  0    Chronic Pain: STOP Act (Not applicable) Fill 1 day early if closed on refill date. Do not fill until: 11/25/2018. To last until: 12/25/2018. Avoid benzodiazepines within 8 hours of opioids   Orders:  No orders of the defined types were placed in this encounter.  Follow-up plan:   Return for (VV), E/M, (MM).      Interventional therapies: Planned, scheduled, and/or pending:  Diagnostic Right L5-S1 LESI under fluoro and IV sedation.   Considering:  Diagnostic bilateralintra-articular knee injection Diagnostic bilateral genicular nerve  block Possible bilateral genicular nerve radiofrequency ablation Diagnostic bilateral lumbar facet block Palliative bilaterallumbar facet radiofrequency ablation(Left side done on 03/30/2016; right on 06/15/2016) Diagnostic left-sided L4-5 lumbar epidural steroid injection Diagnostic bilateral sacroiliac joint block Palliative bilateral sacroiliac joint radiofrequency ablation (Left side done on 03/30/2016; right on 06/15/2016)   Palliative PRN treatment(s):  Palliativebilateral intra-articular knee injection Palliativebilateral genicular nerveblock Palliativebilateral lumbar facetblock Palliativeleft-sided L4-5 lumbar epidural steroid injection Palliativebilateral sacroiliac jointblock    Recent Visits Date Type Provider Dept  06/16/18 Office Visit Milinda Pointer, MD [facility]  Showing recent visits within past 90 days and meeting all other requirements   Today's Visits Date Type Provider Dept  09/14/18  Office Visit Milinda Pointer, MD [facility]  Showing today's visits and meeting all other requirements   Future Appointments No visits were found meeting these conditions.  Showing future appointments within next 90 days and meeting all other requirements   I discussed the assessment and treatment plan with the patient. The patient was provided an opportunity to ask questions and all were answered. The patient agreed with the plan and demonstrated an understanding of the instructions.  Patient advised to call back or seek an in-person evaluation if the symptoms or condition worsens.  Total duration of non-face-to-face encounter: 12 minutes.  Note by: Gaspar Cola, MD Date: 09/14/2018; Time: 9:26 AM  Note: This dictation was prepared with Dragon dictation. Any transcriptional errors that may result from this process are unintentional.  Disclaimer:  * Given the special circumstances of the COVID-19 pandemic, the federal  government has announced that the Office for Civil Rights (OCR) will exercise its enforcement discretion and will not impose penalties on physicians using telehealth in the event of noncompliance with regulatory requirements under the [employer] and [place] (HIPAA) in connection with the good faith provision of telehealth during the XX123456 national public health emergency. ([place])

## 2018-10-10 DIAGNOSIS — E1110 Type 2 diabetes mellitus with ketoacidosis without coma: Secondary | ICD-10-CM | POA: Insufficient documentation

## 2018-10-10 DIAGNOSIS — J189 Pneumonia, unspecified organism: Secondary | ICD-10-CM | POA: Insufficient documentation

## 2018-12-01 ENCOUNTER — Encounter: Payer: Self-pay | Admitting: Pain Medicine

## 2018-12-04 NOTE — Progress Notes (Signed)
Pain Management Virtual Encounter Note - Virtual Visit via Telephone Telehealth (real-time audio visits between healthcare provider and patient).   Patient's Phone No. & Preferred Pharmacy:  [phone] (home); [phone] (mobile); (Preferred) [phone] No e-mail address on record  [place], [place] - [name] Phone: [phone] Fax: [phone]    Pre-screening note:  Our staff contacted [name] and offered him an "in person", "face-to-face" appointment versus a telephone encounter. He indicated preferring the telephone encounter, at this time.   Reason for Virtual Visit: COVID-19*  Social distancing based on CDC and AMA recommendations.   I contacted Alxander Knight on 12/05/2018 via telephone.      I clearly identified myself as Manuel Cola, MD. I verified that I was speaking with the correct person using two identifiers (Name: Manuel Knight, and date of birth: 03-18-1960).  Advanced Informed Consent I sought verbal advanced consent from Manuel Knight for virtual visit interactions. I informed [name] of possible security and privacy concerns, risks, and limitations associated with providing "not-in-person" medical evaluation and management services. I also informed Mr. Ingrum of the availability of "in-person" appointments. Finally, I informed him that there would be a charge for the virtual visit and that he could be  personally, fully or partially, financially responsible for it. Manuel Knight expressed understanding and agreed to proceed.   Historic Elements   [name] is a 58 y.o. year old, male patient evaluated today after his last encounter by our practice on 09/14/2018. Mr. Hanohano  has a past medical history of Anxiety, Arthritis, Cellulitis (2011), Chronic back pain, Claustrophobia, COPD (chronic obstructive pulmonary disease) ([place]), Diabetes mellitus, type 2 ([place]), Diverticulitis of  colon (2008), GERD (gastroesophageal reflux disease), Heart attack ([place]) (2017), History of kidney stones, Hyperlipidemia, Hypertension, Obesity, and Pulmonary nodule. He also  has a past surgical history that includes Appendectomy (2007); Tonsillectomy; Coronary artery bypass graft (N/A, 02/06/2013); and left heart catheterization with coronary angiogram (N/A, 02/03/2013). Mr. Tylka has a current medication list which includes the following prescription(s): alprazolam, amlodipine, amphetamine-dextroamphetamine, aspirin ec, atorvastatin, canagliflozin, carvedilol, citalopram, clonidine, fenofibrate, fluticasone, fluticasone-salmeterol, gabapentin, gabapentin, insulin aspart, ipratropium-albuterol, levemir flextouch, melatonin, meloxicam, metformin, nitroglycerin, oxycodone, oxycodone, oxycodone, pantoprazole, sacubitril-valsartan, tamsulosin, vitamin d (ergocalciferol), magnesium oxide, and naloxone. He  reports that he quit smoking about 30 years ago. His smoking use included cigarettes. He started smoking about 51 years ago. He has a 28.50 pack-year smoking history. He has never used smokeless tobacco. He reports that he does not drink alcohol or use drugs. Manuel Knight is allergic to lodine [etodolac]; tylenol [acetaminophen]; iodine; and propoxyphene n-acetaminophen.   HPI  Today, he is being contacted for medication management.  The patient indicates doing well with the current medication regimen. No adverse reactions or side effects reported to the medications.  The patient indicates currently having a flareup of his lower extremity pain with the pain going all the way down into his big toe when when we appears to be an L5 radiculopathy.  He was given a steroid pack by his primary care physician and he is in the middle of it.  I have recommended that if the steroids by mouth do not help, that he consider having an epidural steroid injection.  He confessed that the other day he had to take 2 of the 15 mg  pills at once.  I warned him against doing that ever again, without my permission.  I warned him about running  out of medicines early and I reminded him that I would not be refilling any of the prescriptions early.  I also reminded him that doubling up on his pain medication could be dangerous.  Pharmacotherapy Assessment  Analgesic: Oxycodone IR 15 mg, 1 tab PO q 6 hrs (60 mg/day of oxycodone) MME/day: 90 mg/day.   Monitoring: Pharmacotherapy: No side-effects or adverse reactions reported. [place] PMP: PDMP reviewed during this encounter.       Compliance: No problems identified. Effectiveness: Clinically acceptable. Plan: Refer to "POC".  UDS:  Summary  Date Value Ref Range Status  03/15/2018 FINAL  Final    Comment:    ==================================================================== TOXASSURE SELECT 13 (MW) ==================================================================== Test                             Result       Flag       Units Drug Present not Declared for Prescription Verification   Desmethyldiazepam              148          UNEXPECTED ng/mg creat   Oxazepam                       337          UNEXPECTED ng/mg creat   Temazepam                      581          UNEXPECTED ng/mg creat    Desmethyldiazepam, oxazepam, and temazepam are benzodiazepine    drugs, but may also be present as common metabolites of other    benzodiazepine drugs, including diazepam.   Alcohol, Ethyl                 0.038        UNEXPECTED g/dL    Sources of ethyl alcohol include alcoholic beverages or as a    fermentation product of glucose; glucose is present in this    specimen.  Interpret result with caution, as the presence of    ethyl alcohol is likely due, at least in part, to fermentation of    glucose. Drug Absent but Declared for Prescription Verification   Amphetamine                    Not Detected UNEXPECTED ng/mg creat   Alprazolam                     Not Detected UNEXPECTED ng/mg  creat   Oxycodone                      Not Detected UNEXPECTED ng/mg creat ==================================================================== Test                      Result    Flag   Units      Ref Range   Creatinine              54               mg/dL      >=20 ==================================================================== Declared Medications:  The flagging and interpretation on this report are based on the  following declared medications.  Unexpected results may arise from  inaccuracies in the declared medications.  **Note: The testing scope of this panel includes these medications:  Alprazolam (Xanax)  Amphetamine (Adderall)  Oxycodone (Roxicodone)  **Note: The testing scope of this panel does not include following  reported medications:  Albuterol  Albuterol (Combivent)  Amlodipine (Norvasc)  Aspirin (Aspirin 81)  Atorvastatin (Lipitor)  Canagliflozin (Invokana)  Carvedilol (Coreg)  Citalopram (Celexa)  Clonidine (Catapres)  Fenofibrate  Fluticasone (Advair)  Fluticasone (Flonase)  Gabapentin  Insulin (Levemir)  Insulin (NovoLog)  Ipratropium (Combivent)  Magnesium (Mag-Ox)  Melatonin  Meloxicam (Mobic)  Metformin (Glucophage)  Naloxone (Narcan)  Nitroglycerin  Pantoprazole (Protonix)  Sacubitril (Entresto)  Salmeterol (Advair)  Tiotropium (Spiriva)  Valsartan (Entresto)  Vitamin D3 ==================================================================== For clinical consultation, please call [phone]. ====================================================================    Laboratory Chemistry Profile (12 mo)  Renal: 06/24/2018: BUN 18; Creatinine, Ser 0.95  Lab Results  Component Value Date   GFRAA >60 06/24/2018   GFRNONAA >60 06/24/2018   Hepatic: 06/24/2018: Albumin 4.0 Lab Results  Component Value Date   AST 22 06/24/2018   ALT 28 06/24/2018   Other: 06/24/2018: CRP <0.8; Sed Rate 5; Vit D, 25-Hydroxy 39.7; Vitamin B-12 368 Note: Above  Lab results reviewed.  Imaging  Last 90 days:  No results found.  Assessment  The primary encounter diagnosis was Chronic lower extremity pain (Third area of Pain) (Bilateral) (R>L). Diagnoses of Chronic lumbar radicular pain (L5 dermatome) (Right), Chronic pain syndrome, Chronic knee pain (Primary Area of Pain) (Bilateral) (L>R), Chronic low back pain (Secondary area of Pain) (Bilateral) (L>R), Opiate use (90 MME/Day), and Chronic musculoskeletal pain were also pertinent to this visit.  Plan of Care  I have discontinued Ulises Zenk's naloxone, Vitamin D3, and naloxone. I have also changed his Magnesium Oxide. Additionally, I am having him start on oxyCODONE, oxyCODONE, and naloxone. Lastly, I am having him maintain his Fluticasone-Salmeterol, metFORMIN, canagliflozin, insulin aspart, alprazolam, atorvastatin, citalopram, aspirin EC, nitroGLYCERIN, meloxicam, amLODipine, gabapentin, cloNIDine, sacubitril-valsartan, gabapentin, amphetamine-dextroamphetamine, fluticasone, carvedilol, pantoprazole, Melatonin, Levemir FlexTouch, Ipratropium-Albuterol, fenofibrate, Vitamin D (Ergocalciferol), tamsulosin, and oxyCODONE.  Pharmacotherapy (Medications Ordered): Meds ordered this encounter  Medications  . Magnesium Oxide 500 MG TABS    Sig: Take 1 tablet (500 mg total) by mouth at bedtime.    Dispense:  90 tablet    Refill:  3    Fill one day early if pharmacy is closed on scheduled refill date. May substitute for generic if available.  Marland Kitchen oxyCODONE (ROXICODONE) 15 MG immediate release tablet    Sig: Take 1 tablet (15 mg total) by mouth every 6 (six) hours as needed for pain. Must last 30 days    Dispense:  120 tablet    Refill:  0    Chronic Pain: STOP Act (Not applicable) Fill 1 day early if closed on refill date. Do not fill until: 12/25/2018. To last until: 01/24/2019. Avoid benzodiazepines within 8 hours of opioids  . oxyCODONE (ROXICODONE) 15 MG immediate release tablet    Sig: Take 1 tablet  (15 mg total) by mouth every 6 (six) hours as needed for pain. Must last 30 days    Dispense:  120 tablet    Refill:  0    Chronic Pain: STOP Act (Not applicable) Fill 1 day early if closed on refill date. Do not fill until: 01/24/2019. To last until: 02/23/2019. Avoid benzodiazepines within 8 hours of opioids  . oxyCODONE (ROXICODONE) 15 MG immediate release tablet    Sig: Take 1 tablet (15 mg total) by mouth every 6 (six) hours as needed for pain. Must last 30 days    Dispense:  120 tablet  Refill:  0    Chronic Pain: STOP Act (Not applicable) Fill 1 day early if closed on refill date. Do not fill until: 02/23/2019. To last until: 03/25/2019. Avoid benzodiazepines within 8 hours of opioids  . naloxone (NARCAN) 2 MG/2ML injection    Sig: Inject 1 mL (1 mg total) into the muscle as needed for up to 2 doses (for emergency use only). Always have available. Inject into thigh muscle and call 911    Dispense:  2 mL    Refill:  1    Please instruct patient on emergency use of medication for opioid overdose.   Orders:  Orders Placed This Encounter  Procedures  . LESI (PRN)    Standing Status:   Standing    Number of Occurrences:   9    Standing Expiration Date:   06/03/2020    Scheduling Instructions:     Purpose: Palliative     Indication: Lower extremity pain/Sciatica right (M54.31).     Side: Right-sided     Level: L5-S1     Sedation: With Sedation.     TIMEFRAME: PRN procedure. (Mr. Gallian will call when needed.)    Order Specific Question:   Where will this procedure be performed?    Answer:   [facility]   Follow-up plan:   Return in about 4 months (around 03/22/2019) for (VV), (MM), in addition, PRN Procedure(s): (R) L5-S1 LESI, (w/ Sedation).      Interventional treatment options: Planned, scheduled, and/or pending:  Diagnostic Right L5-S1 LESI under fluoro and IV sedation.  (PRN)   Under consideration:  NOTE: History of diabetic ketoacidosis Possible bilateral  genicular nerve RFA Diagnostic left L4-5 LESI   Therapeutic/palliative (PRN):  Palliativebilateral IA knee injection Palliativebilateral genicular NB Palliativebilateral lumbar facetblock Palliative rightlumbar facet RFA(Last done 06/15/2016)  Palliative leftlumbar facet RFA(Last done 03/30/2016)  Palliativeleft-sided L4-5 LESI Palliativebilateral SI jointblock  Palliative right SI RFA (Last done 06/15/2016)  Palliative left SI RFA (Last done 03/30/2016)    Recent Visits Date Type Provider Dept  09/14/18 Office Visit Milinda Pointer, MD [facility]  Showing recent visits within past 90 days and meeting all other requirements   Today's Visits Date Type Provider Dept  12/05/18 Telemedicine Milinda Pointer, MD [facility]  Showing today's visits and meeting all other requirements   Future Appointments No visits were found meeting these conditions.  Showing future appointments within next 90 days and meeting all other requirements   I discussed the assessment and treatment plan with the patient. The patient was provided an opportunity to ask questions and all were answered. The patient agreed with the plan and demonstrated an understanding of the instructions.  Patient advised to call back or seek an in-person evaluation if the symptoms or condition worsens.  Total duration of non-face-to-face encounter: 15 minutes.  Note by: Manuel Cola, MD Date: 12/05/2018; Time: 9:02 AM  Note: This dictation was prepared with Dragon dictation. Any transcriptional errors that may result from this process are unintentional.  Disclaimer:  * Given the special circumstances of the COVID-19 pandemic, the federal government has announced that the Office for Civil Rights (OCR) will exercise its enforcement discretion and will not impose penalties on physicians using telehealth in the event of noncompliance with regulatory requirements under the [place] (HIPAA) in connection with the good faith provision of telehealth during the XX123456 national public health emergency. ([place])

## 2018-12-05 ENCOUNTER — Ambulatory Visit: Payer: Medicare Other | Attending: Pain Medicine | Admitting: Pain Medicine

## 2018-12-05 ENCOUNTER — Other Ambulatory Visit: Payer: Self-pay

## 2018-12-05 DIAGNOSIS — M79604 Pain in right leg: Secondary | ICD-10-CM | POA: Diagnosis not present

## 2018-12-05 DIAGNOSIS — F1190 Opioid use, unspecified, uncomplicated: Secondary | ICD-10-CM

## 2018-12-05 DIAGNOSIS — G8929 Other chronic pain: Secondary | ICD-10-CM

## 2018-12-05 DIAGNOSIS — M7918 Myalgia, other site: Secondary | ICD-10-CM

## 2018-12-05 DIAGNOSIS — G894 Chronic pain syndrome: Secondary | ICD-10-CM | POA: Diagnosis not present

## 2018-12-05 DIAGNOSIS — M25562 Pain in left knee: Secondary | ICD-10-CM

## 2018-12-05 DIAGNOSIS — M79605 Pain in left leg: Secondary | ICD-10-CM

## 2018-12-05 DIAGNOSIS — M5416 Radiculopathy, lumbar region: Secondary | ICD-10-CM

## 2018-12-05 DIAGNOSIS — M25561 Pain in right knee: Secondary | ICD-10-CM

## 2018-12-05 DIAGNOSIS — M545 Low back pain: Secondary | ICD-10-CM

## 2018-12-05 MED ORDER — OXYCODONE HCL 15 MG PO TABS: 15.0000 mg | ORAL_TABLET | Freq: Four times a day (QID) | ORAL | 0 refills | Status: DC | PRN

## 2018-12-05 MED ORDER — MAGNESIUM OXIDE -MG SUPPLEMENT 500 MG PO TABS: 1.0000 | ORAL_TABLET | Freq: Every day | ORAL | 3 refills | Status: DC

## 2018-12-05 MED ORDER — NALOXONE HCL 2 MG/2ML IJ SOSY: 1.0000 mg | PREFILLED_SYRINGE | INTRAMUSCULAR | 1 refills | Status: DC | PRN

## 2018-12-05 NOTE — Patient Instructions (Signed)

## 2019-03-21 ENCOUNTER — Encounter: Payer: Self-pay | Admitting: Pain Medicine

## 2019-03-21 NOTE — Progress Notes (Signed)
Patient: Manuel Knight  Service Category: E/M  Provider: Gaspar Cola, MD  DOB: [birth year] Knight  DOS: 03/22/2019  Location: Office  MRN: [id number]  Setting: Ambulatory outpatient  Referring Provider: [facility]  Type: Established Patient  Specialty: Interventional Pain Management  PCP: [facility]  Location: Remote location  Delivery: TeleHealth     Virtual Encounter - Pain Management PROVIDER NOTE: Information contained herein reflects review and annotations entered in association with encounter. Interpretation of such information and data should be left to medically-trained personnel. Information provided to patient can be located elsewhere in the medical record under "Patient Instructions". Document created using STT-dictation technology, any transcriptional errors that may result from process are unintentional.    Contact & Pharmacy Preferred: [phone] Home: [phone] (home) Mobile: [phone] (mobile) E-mail: No e-mail address on record  [place], [place] - [address] [place] [address] Phone: [phone] Fax: [phone]   Pre-screening  Manuel Knight offered "in-person" vs "virtual" encounter. He indicated preferring virtual for this encounter.   Reason COVID-19*  Social distancing based on CDC and AMA recommendations.   I contacted Manuel Knight on 03/22/2019 via telephone.      I clearly identified myself as Gaspar Cola, MD. I verified that I was speaking with the correct person using two identifiers (Name: Manuel Knight, and date of birth: Manuel Knight, Manuel Knight).  Consent I sought verbal advanced consent from Manuel Knight for virtual visit interactions. I informed Manuel Knight of possible security and privacy concerns, risks, and limitations associated with providing "not-in-person" medical evaluation and management services. I also informed Manuel Knight of the availability of "in-person"  appointments. Finally, I informed him that there would be a charge for the virtual visit and that he could be  personally, fully or partially, financially responsible for it. Manuel Knight expressed understanding and agreed to proceed.   Historic Elements   Manuel Knight is a 59 y.o. year old, male patient evaluated today after his last contact with our practice on Visit date not found. Manuel Knight  has a past medical history of Anxiety, Arthritis, Cellulitis (2011), Chronic back pain, Claustrophobia, COPD (chronic obstructive pulmonary disease) ([place]), Diabetes mellitus, type 2 ([place]), Diverticulitis of colon (2008), GERD (gastroesophageal reflux disease), Heart attack ([place]) (2017), History of kidney stones, Hyperlipidemia, Hypertension, Obesity, and Pulmonary nodule. He also  has a past surgical history that includes Appendectomy (2007); Tonsillectomy; Coronary artery bypass graft (N/A, 02/06/2013); and left heart catheterization with coronary angiogram (N/A, 02/03/2013). Manuel Knight has a current medication list which includes the following prescription(s): alprazolam, amlodipine, amphetamine-dextroamphetamine, aspirin ec, atorvastatin, azelastine, carvedilol, citalopram, clonidine, fenofibrate, fluticasone, gabapentin, gabapentin, insulin aspart, ipratropium-albuterol, levemir flextouch, magnesium oxide, melatonin, meloxicam, metformin, naloxone, nitroglycerin, [START ON 03/25/2019] oxycodone, [START ON 04/24/2019] oxycodone, [START ON 05/24/2019] oxycodone, pantoprazole, sacubitril-valsartan, tamsulosin, and vitamin d (ergocalciferol). He  reports that he quit smoking about 30 years ago. His smoking use included cigarettes. He started smoking about 52 years ago. He has a 28.50 pack-year smoking history. He has never used smokeless tobacco. He reports that he does not drink alcohol or use drugs. Manuel Knight is allergic to lodine [etodolac]; tylenol [acetaminophen]; iodine; and propoxyphene n-acetaminophen.    HPI  Today, he is being contacted for medication management.  Pharmacotherapy Assessment  Analgesic: Oxycodone IR 15 mg, 1 tab PO q 6 hrs (60 mg/day of oxycodone) MME/day: 90 mg/day.   Monitoring: [place] PMP: PDMP reviewed during this encounter.  Pharmacotherapy: No side-effects or adverse reactions reported. Compliance: No problems identified. Effectiveness: Clinically acceptable. Plan: Refer to "POC".  UDS:  Summary  Date Value Ref Range Status  03/15/2018 FINAL  Final    Comment:    ==================================================================== TOXASSURE SELECT 13 (MW) ==================================================================== Test                             Result       Flag       Units Drug Present not Declared for Prescription Verification   Desmethyldiazepam              148          UNEXPECTED ng/mg creat   Oxazepam                       337          UNEXPECTED ng/mg creat   Temazepam                      581          UNEXPECTED ng/mg creat    Desmethyldiazepam, oxazepam, and temazepam are benzodiazepine    drugs, but may also be present as common metabolites of other    benzodiazepine drugs, including diazepam.   Alcohol, Ethyl                 0.038        UNEXPECTED g/dL    Sources of ethyl alcohol include alcoholic beverages or as a    fermentation product of glucose; glucose is present in this    specimen.  Interpret result with caution, as the presence of    ethyl alcohol is likely due, at least in part, to fermentation of    glucose. Drug Absent but Declared for Prescription Verification   Amphetamine                    Not Detected UNEXPECTED ng/mg creat   Alprazolam                     Not Detected UNEXPECTED ng/mg creat   Oxycodone                      Not Detected UNEXPECTED ng/mg creat ==================================================================== Test                      Result    Flag   Units      Ref Range   Creatinine               54               mg/dL      >=20 ==================================================================== Declared Medications:  The flagging and interpretation on this report are based on the  following declared medications.  Unexpected results may arise from  inaccuracies in the declared medications.  **Note: The testing scope of this panel includes these medications:  Alprazolam (Xanax)  Amphetamine (Adderall)  Oxycodone (Roxicodone)  **Note: The testing scope of this panel does not include following  reported medications:  Albuterol  Albuterol (Combivent)  Amlodipine (Norvasc)  Aspirin (Aspirin 81)  Atorvastatin (Lipitor)  Canagliflozin (Invokana)  Carvedilol (Coreg)  Citalopram (Celexa)  Clonidine (Catapres)  Fenofibrate  Fluticasone (Advair)  Fluticasone (Flonase)  Gabapentin  Insulin (Levemir)  Insulin (NovoLog)  Ipratropium (Combivent)  Magnesium (  Mag-Ox)  Melatonin  Meloxicam (Mobic)  Metformin (Glucophage)  Naloxone (Narcan)  Nitroglycerin  Pantoprazole (Protonix)  Sacubitril (Entresto)  Salmeterol (Advair)  Tiotropium (Spiriva)  Valsartan (Entresto)  Vitamin D3 ==================================================================== For clinical consultation, please call [phone]. ====================================================================    Laboratory Chemistry Profile   Renal Lab Results  Component Value Date   BUN 18 06/24/2018   CREATININE 0.95 06/24/2018   LABCREA 113.6 01/25/2009   BCR 19 06/04/2016   GFRAA >60 06/24/2018   GFRNONAA >60 06/24/2018    Hepatic Lab Results  Component Value Date   AST 22 06/24/2018   ALT 28 06/24/2018   ALBUMIN 4.0 06/24/2018   ALKPHOS 51 06/24/2018   HCVAB NEGATIVE 11/22/2008   LIPASE 35 11/21/2008    Electrolytes Lab Results  Component Value Date   NA 135 06/24/2018   K 5.0 06/24/2018   CL 100 06/24/2018   CALCIUM 8.8 (L) 06/24/2018   MG 1.8 06/24/2018   PHOS 3.2 03/12/2015     Bone Lab Results  Component Value Date   VD25OH 39.7 06/24/2018   25OHVITD1 31 06/04/2016   25OHVITD2 1.9 06/04/2016   25OHVITD3 Knight 06/04/2016    Inflammation (CRP: Acute Phase) (ESR: Chronic Phase) Lab Results  Component Value Date   CRP <0.8 06/24/2018   ESRSEDRATE 5 06/24/2018   LATICACIDVEN 1.3 03/12/2015      Note: Above Lab results reviewed.  Imaging  ECHOCARDIOGRAM COMPLETE                            *[facility]*                   [address]                           [place], [place] [address]                            [phone]  ------------------------------------------------------------------- Transthoracic Echocardiography  Patient:    Clearnce, Leja MR #:       [id number] Study Date: 11/11/2017 Gender:     M Age:        73 Height:     175.3 cm Weight:     121.2 kg BSA:        2.48 m^2 Pt. Status: Room:   SONOGRAPHER  [facility]  ATTENDING    Kate Sable, MD  [place], MD  [place], MD  PERFORMING   [facility]  cc:  ------------------------------------------------------------------- LV EF: 55%  ------------------------------------------------------------------- Indications:     I25.708  ------------------------------------------------------------------- History:   PMH:   Coronary artery disease.  Chronic obstructive pulmonary disease. NSTEMI.  Risk factors:  Hypertension. Diabetes mellitus. Obese.  ------------------------------------------------------------------- Study Conclusions  - Left ventricle: The cavity size was mildly dilated. Wall   thickness was normal. The estimated ejection fraction was 55%.   There is hypokinesis of the basalinferior myocardium.   Indeterminate diastolic function. - Aortic valve: Mildly calcified annulus. Trileaflet. - Mitral valve: Mildly calcified annulus. There was trivial   regurgitation. - Tricuspid valve: There was trivial  regurgitation. - Pulmonary arteries: Systolic pressure could not be accurately   estimated. - Pericardium, extracardiac: A prominent pericardial fat pad was   present.  ------------------------------------------------------------------- Labs, prior tests, procedures, and surgery: Echocardiography (November 2015).  EF was 35%.  Coronary artery bypass grafting.  ------------------------------------------------------------------- Study data:  Comparison was made to the study of November 2015. Study status:  Routine.  Procedure:  The patient reported no pain pre or post test. Transthoracic echocardiography. Image quality was adequate.  Study completion:  There were no complications. Transthoracic echocardiography.  M-mode, complete 2D, spectral Doppler, and color Doppler.  Birthdate:  Patient birthdate: [birth year]uel Knight.  Age:  Patient is 59 yr old.  Sex:  Gender: male. BMI: 39.4 kg/m^2.  Blood pressure:     130/78  Patient status: Outpatient.  Study date:  Study date: 11/11/2017. Study time: 10:47 AM.  -------------------------------------------------------------------  ------------------------------------------------------------------- Left ventricle:  The cavity size was mildly dilated. Wall thickness was normal. The estimated ejection fraction was 55%.  Regional wall motion abnormalities:   There is hypokinesis of the basalinferior myocardium. Indeterminate diastolic function.  ------------------------------------------------------------------- Aortic valve:   Mildly calcified annulus. Trileaflet.  Doppler: There was no significant regurgitation.  ------------------------------------------------------------------- Aorta:  Aortic root: The aortic root was normal in size.  ------------------------------------------------------------------- Mitral valve:   Mildly calcified annulus.  Doppler:  There was trivial  regurgitation.  ------------------------------------------------------------------- Left atrium:  The atrium was normal in size.  ------------------------------------------------------------------- Right ventricle:  The cavity size was normal. Systolic function was normal.  ------------------------------------------------------------------- Pulmonic valve:    The valve appears to be grossly normal. Doppler:  There was no significant regurgitation.  ------------------------------------------------------------------- Tricuspid valve:   The valve appears to be grossly normal. Doppler:  There was trivial regurgitation.  ------------------------------------------------------------------- Pulmonary artery:    Systolic pressure could not be accurately estimated.  ------------------------------------------------------------------- Right atrium:  The atrium was normal in size.  ------------------------------------------------------------------- Pericardium:  A prominent pericardial fat pad was present.  ------------------------------------------------------------------- Systemic veins: Inferior vena cava: Not visualized.  ------------------------------------------------------------------- Measurements   Left ventricle                             Value        Reference  LV ID, ED, PLAX chordal            (H)     62.4  mm     43 - 52  LV ID, ES, PLAX chordal            (H)     43.7  mm     23 - 38  LV fx shortening, PLAX chordal             30    %      >=Knight  LV PW thickness, ED                        10.6  mm     ----------  IVS/LV PW ratio, ED                        0.82         <=1.3  Stroke volume, 2D                          60    ml     ----------  Stroke volume/bsa, 2D                      24    ml/m^2 ----------  LV ejection fraction, 1-p A4C  59    %      ----------  LV end-diastolic volume, 2-p               132   ml     ----------  LV end-systolic volume, 2-p                 58    ml     ----------  LV ejection fraction, 2-p                  56    %      ----------  Stroke volume, 2-p                         74    ml     ----------  LV end-diastolic volume/bsa, 2-p           53    ml/m^2 ----------  LV end-systolic volume/bsa, 2-p            24    ml/m^2 ----------  Stroke volume/bsa, 2-p                     Knight.6  ml/m^2 ----------  LV e&', lateral                             10.3  cm/s   ----------  LV E/e&', lateral                           6.78         ----------  LV e&', medial                              5.03  cm/s   ----------  LV E/e&', medial                            13.88        ----------  LV e&', average                             7.67  cm/s   ----------  LV E/e&', average                           9.11         ----------    Ventricular septum                         Value        Reference  IVS thickness, ED                          8.74  mm     ----------    LVOT                                       Value        Reference  LVOT ID, S                                   21    mm     ----------  LVOT area                                  3.46  cm^2   ----------  LVOT peak velocity, S                      84.4  cm/s   ----------  LVOT mean velocity, S                      60.1  cm/s   ----------  LVOT VTI, S                                17.2  cm     ----------    Aorta                                      Value        Reference  Aortic root ID, ED                         40    mm     ----------    Left atrium                                Value        Reference  LA ID, A-P, ES                             35    mm     ----------  LA ID/bsa, A-P                             1.41  cm/m^2 <=2.2  LA volume, S                               61.9  ml     ----------  LA volume/bsa, S                           24.9  ml/m^2 ----------  LA volume, ES, 1-p A4C                     55.3  ml     ----------  LA volume/bsa, ES, 1-p A4C                  22.3  ml/m^2 ----------  LA volume, ES, 1-p A2C                     62.9  ml     ----------  LA volume/bsa, ES, 1-p A2C                 25.3  ml/m^2 ----------    Mitral valve                                 Value        Reference  Mitral E-wave peak velocity                69.8  cm/s   ----------  Mitral A-wave peak velocity                83.9  cm/s   ----------  Mitral deceleration time           (H)     298   ms     150 - 230  Mitral E/A ratio, peak                     0.8          ----------  Mitral maximal regurg velocity,            244   cm/s   ----------  PISA    Tricuspid valve                            Value        Reference  Tricuspid regurg peak velocity             100   cm/s   ----------  Tricuspid peak RV-RA gradient              4     mm Hg  ----------    Right atrium                               Value        Reference  RA ID, S-I, ES, A4C                        48.1  mm     34 - 49  RA area, ES, A4C                           14    cm^2   8.3 - 19.5  RA volume, ES, A/L                         33.3  ml     ----------  RA volume/bsa, ES, A/L                     13.4  ml/m^2 ----------    Right ventricle                            Value        Reference  TAPSE                                      17.4  mm     ----------  RV s&', lateral, S                          10.3  cm/s   ----------  Legend: (L)  and  (H)  mark values outside specified reference range.  ------------------------------------------------------------------- Prepared and Electronically Authenticated by  Samuel McDowell, M.D. 2019-10-31T15:25:23  Assessment  The primary encounter diagnosis was Chronic knee pain (Primary Area of Pain) (Bilateral) (  L>R). Diagnoses of Chronic pain syndrome, Chronic low back pain (Secondary area of Pain) (Bilateral) (L>R), and Chronic lower extremity pain (Third area of Pain) (Bilateral) (R>L) were also pertinent to this visit.  Plan of Care  Problem-specific:  No  problem-specific Assessment & Plan notes found for this encounter.  Manuel Knight has a current medication list which includes the following long-term medication(s): amphetamine-dextroamphetamine, azelastine, citalopram, insulin aspart, ipratropium-albuterol, magnesium oxide, metformin, nitroglycerin, [START ON 03/25/2019] oxycodone, [START ON 04/24/2019] oxycodone, and [START ON 05/24/2019] oxycodone.  Pharmacotherapy (Medications Ordered): Meds ordered this encounter  Medications  . oxyCODONE (ROXICODONE) 15 MG immediate release tablet    Sig: Take 1 tablet (15 mg total) by mouth every 6 (six) hours as needed for pain. Must last 30 days    Dispense:  120 tablet    Refill:  0    Chronic Pain: STOP Act (Not applicable) Fill 1 day early if closed on refill date. Do not fill until: 03/25/2019. To last until: 04/24/2019. Avoid benzodiazepines within 8 hours of opioids  . oxyCODONE (ROXICODONE) 15 MG immediate release tablet    Sig: Take 1 tablet (15 mg total) by mouth every 6 (six) hours as needed for pain. Must last 30 days    Dispense:  120 tablet    Refill:  0    Chronic Pain: STOP Act (Not applicable) Fill 1 day early if closed on refill date. Do not fill until: 04/24/2019. To last until: 05/24/2019. Avoid benzodiazepines within 8 hours of opioids  . oxyCODONE (ROXICODONE) 15 MG immediate release tablet    Sig: Take 1 tablet (15 mg total) by mouth every 6 (six) hours as needed for pain. Must last 30 days    Dispense:  120 tablet    Refill:  0    Chronic Pain: STOP Act (Not applicable) Fill 1 day early if closed on refill date. Do not fill until: 05/24/2019. To last until: 06/23/2019. Avoid benzodiazepines within 8 hours of opioids   Orders:  No orders of the defined types were placed in this encounter.  Follow-up plan:   Return in about 13 weeks (around 06/21/2019) for (VV), (MM).      Interventional treatment options: Planned, scheduled, and/or pending:  Diagnostic Right L5-S1 LESI under  fluoro and IV sedation.  (PRN)   Under consideration:  NOTE: History of diabetic ketoacidosis Possible bilateral genicular nerve RFA Diagnostic left L4-5 LESI   Therapeutic/palliative (PRN):  Palliativebilateral IA knee injection Palliativebilateral genicular NB Palliativebilateral lumbar facetblock Palliative rightlumbar facet RFA(Last done 06/15/2016)  Palliative leftlumbar facet RFA(Last done 03/30/2016)  Palliativeleft-sided L4-5 LESI Palliativebilateral SI jointblock  Palliative right SI RFA (Last done 06/15/2016)  Palliative left SI RFA (Last done 03/30/2016)     Recent Visits No visits were found meeting these conditions.  Showing recent visits within past 90 days and meeting all other requirements   Today's Visits Date Type Provider Dept  03/22/19 Telemedicine Milinda Pointer, MD [facility]  Showing today's visits and meeting all other requirements   Future Appointments No visits were found meeting these conditions.  Showing future appointments within next 90 days and meeting all other requirements   I discussed the assessment and treatment plan with the patient. The patient was provided an opportunity to ask questions and all were answered. The patient agreed with the plan and demonstrated an understanding of the instructions.  Patient advised to call back or seek an in-person evaluation if the symptoms or condition worsens.  Duration of encounter: 12  minutes.  Note by: Francisco A Naveira, MD Date: 03/22/2019; Time: 9:15 AM 

## 2019-03-22 ENCOUNTER — Ambulatory Visit: Payer: Medicare Other | Attending: Pain Medicine | Admitting: Pain Medicine

## 2019-03-22 ENCOUNTER — Other Ambulatory Visit: Payer: Self-pay

## 2019-03-22 DIAGNOSIS — G894 Chronic pain syndrome: Secondary | ICD-10-CM

## 2019-03-22 DIAGNOSIS — M545 Low back pain: Secondary | ICD-10-CM

## 2019-03-22 DIAGNOSIS — M25561 Pain in right knee: Secondary | ICD-10-CM | POA: Diagnosis not present

## 2019-03-22 DIAGNOSIS — G8929 Other chronic pain: Secondary | ICD-10-CM

## 2019-03-22 DIAGNOSIS — M79604 Pain in right leg: Secondary | ICD-10-CM

## 2019-03-22 DIAGNOSIS — M79605 Pain in left leg: Secondary | ICD-10-CM

## 2019-03-22 DIAGNOSIS — M25562 Pain in left knee: Secondary | ICD-10-CM | POA: Diagnosis not present

## 2019-03-22 MED ORDER — OXYCODONE HCL 15 MG PO TABS: 15.0000 mg | ORAL_TABLET | Freq: Four times a day (QID) | ORAL | 0 refills | Status: DC | PRN

## 2019-04-04 ENCOUNTER — Telehealth: Payer: Medicare Other | Admitting: Cardiovascular Disease

## 2019-05-12 ENCOUNTER — Telehealth (INDEPENDENT_AMBULATORY_CARE_PROVIDER_SITE_OTHER): Payer: Medicare Other | Admitting: Cardiovascular Disease

## 2019-05-12 ENCOUNTER — Encounter: Payer: Self-pay | Admitting: Cardiovascular Disease

## 2019-05-12 VITALS — Ht 69.0 in | Wt 254.0 lb

## 2019-05-12 DIAGNOSIS — I255 Ischemic cardiomyopathy: Secondary | ICD-10-CM | POA: Diagnosis not present

## 2019-05-12 DIAGNOSIS — E782 Mixed hyperlipidemia: Secondary | ICD-10-CM | POA: Diagnosis not present

## 2019-05-12 DIAGNOSIS — I10 Essential (primary) hypertension: Secondary | ICD-10-CM | POA: Diagnosis not present

## 2019-05-12 DIAGNOSIS — I25708 Atherosclerosis of coronary artery bypass graft(s), unspecified, with other forms of angina pectoris: Secondary | ICD-10-CM | POA: Diagnosis not present

## 2019-05-12 NOTE — Patient Instructions (Addendum)
Your physician wants you to follow-up in: [place] will receive a reminder letter in the mail two months in advance. If you don't receive a letter, please call our office to schedule the follow-up appointment.  Your physician recommends that you continue on your current medications as directed. Please refer to the Current Medication list given to you today.  Your physician recommends that you return for a FASTING lipid profile  Thank you for choosing [facility]!!

## 2019-05-12 NOTE — Addendum Note (Signed)
Addended by: Julian Hy T on: 05/12/2019 08:52 AM   Modules accepted: Orders

## 2019-05-12 NOTE — Progress Notes (Signed)
Virtual Visit via Telephone Note   This visit type was conducted due to national recommendations for restrictions regarding the COVID-19 Pandemic (e.g. social distancing) in an effort to limit this patient's exposure and mitigate transmission in our community.  Due to his co-morbid illnesses, this patient is at least at moderate risk for complications without adequate follow up.  This format is felt to be most appropriate for this patient at this time.  The patient did not have access to video technology/had technical difficulties with video requiring transitioning to audio format only (telephone).  All issues noted in this document were discussed and addressed.  No physical exam could be performed with this format.  Please refer to the patient's chart for his  consent to telehealth for [facility].   Date:  05/12/2019   ID:  Manuel Knight, DOB 10/21/1960, MRN [id number]  Patient Location: Home Provider Location: Office  PCP:  [facility]  Cardiologist:  Kate Sable, MD  Electrophysiologist:  None   Evaluation Performed:  Follow-Up Visit  Chief Complaint:  CAD  History of Present Illness:    Manuel Knight is a 59 y.o. male with a history of diabetes, hyperlipidemia, hypertension, COPD, and CAD. He sustained a non-ST elevation myocardial infarction and ultimately underwent 3-vessel coronary artery bypass graft surgery on 02/06/13, with a LIMA to the LAD, SVG to OM1, and SVG to RCA. Left ventriculography revealed an EF of 30% and LVEDP was 20 mmHg.  He has chronic back pain and left knee pain and sees pain management once every 3 months.  He denies chest pain.  Episodic shortness of breath is stable. He has been bothered by the pollen and seasonal allergies.  He denies orthopnea, palpitations, and leg swelling.     Past Medical History:  Diagnosis Date  . Anxiety   . Arthritis    RA  . Cellulitis 2011   of neck   . Chronic back pain   .  Claustrophobia   . COPD (chronic obstructive pulmonary disease) ([place])   . Diabetes mellitus, type 2 ([place])   . Diverticulitis of colon [id number]   complicated by abscess  . GERD (gastroesophageal reflux disease)   . Heart attack ([place]) 2017  . History of kidney stones   . Hyperlipidemia   . Hypertension   . Obesity   . Pulmonary nodule    Past Surgical History:  Procedure Laterality Date  . APPENDECTOMY  2007   gangrenous appendicitis   . CORONARY ARTERY BYPASS GRAFT N/A 02/06/2013   Procedure: CORONARY ARTERY BYPASS GRAFTING (CABG) with TEE;  Surgeon: [name], MD;  Location: [place] OR;  Service: Open Heart Surgery;  Laterality: N/A;  CABG x three,  using left internal mammary artery and right leg greater saphenous vein harvested endoscopically  . LEFT HEART CATHETERIZATION WITH CORONARY ANGIOGRAM N/A 02/03/2013   Procedure: LEFT HEART CATHETERIZATION WITH CORONARY ANGIOGRAM;  Surgeon: Jettie Booze, MD;  Location: [facility] CATH LAB;  Service: Cardiovascular;  Laterality: N/A;  . TONSILLECTOMY       Current Meds  Medication Sig  . alprazolam (XANAX) 2 MG tablet Take 2 mg by mouth 3 (three) times daily.   Marland Kitchen amLODipine (NORVASC) 10 MG tablet Take 10 mg by mouth daily.  Marland Kitchen amphetamine-dextroamphetamine (ADDERALL) 30 MG tablet Take 30 mg by mouth 3 (three) times daily.   Marland Kitchen aspirin EC 81 MG tablet Take 1 tablet (81 mg total) by mouth daily.  Marland Kitchen atorvastatin (LIPITOR) 80 MG  tablet Take 80 mg by mouth daily.   Marland Kitchen azelastine (ASTELIN) 0.1 % nasal spray Place 1 spray into both nostrils 2 (two) times daily.  . carvedilol (COREG) 12.5 MG tablet Take 12.5 mg by mouth 2 (two) times daily.  . citalopram (CELEXA) 20 MG tablet Take 20 mg by mouth daily.   . cloNIDine (CATAPRES) 0.3 MG tablet Take 0.3 mg by mouth 2 (two) times daily.  . fenofibrate 160 MG tablet Take 1 tablet by mouth daily.  . fluticasone (FLONASE) 50 MCG/ACT nasal spray Place 1-2 sprays into both nostrils 2 (two) times daily as needed  (for allergies.).   Marland Kitchen gabapentin (NEURONTIN) 300 MG capsule TAKE ONE CAPSULE BY MOUTH THREE TIMES DAILY. (TAKE WITH 600MG  TABLET TO TOTAL 900MG )  . gabapentin (NEURONTIN) 600 MG tablet Take 600 mg by mouth 3 (three) times daily.  . insulin aspart (NOVOLOG FLEXPEN) 100 UNIT/ML FlexPen Inject 20 Units into the skin 3 (three) times daily. 20 units three times a day with sliding scale max of 26 units   . Ipratropium-Albuterol (COMBIVENT RESPIMAT) 20-100 MCG/ACT AERS respimat Inhale 1 puff into the lungs every 6 (six) hours.  Marland Kitchen LEVEMIR FLEXTOUCH 100 UNIT/ML Pen Inject 50 Units into the skin at bedtime.  . Magnesium Oxide 500 MG TABS Take 1 tablet (500 mg total) by mouth at bedtime.  . Melatonin 10 MG TABS Take 10 mg by mouth at bedtime.  . meloxicam (MOBIC) 15 MG tablet Take 15 mg by mouth 2 (two) times daily.  . metFORMIN (GLUCOPHAGE) 1000 MG tablet Take 1,000 mg by mouth 2 (two) times daily.   . naloxone (NARCAN) 2 MG/2ML injection Inject 1 mL (1 mg total) into the muscle as needed for up to 2 doses (for emergency use only). Always have available. Inject into thigh muscle and call 911  . nitroGLYCERIN (NITROSTAT) 0.4 MG SL tablet Place 1 tablet (0.4 mg total) under the tongue every 5 (five) minutes as needed for chest pain.  Derrill Memo ON 05/24/2019] oxyCODONE (ROXICODONE) 15 MG immediate release tablet Take 1 tablet (15 mg total) by mouth every 6 (six) hours as needed for pain. Must last 30 days  . pantoprazole (PROTONIX) 40 MG tablet Take 40 mg by mouth 2 (two) times daily.  . sacubitril-valsartan (ENTRESTO) 97-103 MG Take 1 tablet by mouth 2 (two) times daily.  . tamsulosin (FLOMAX) 0.4 MG CAPS capsule Take 0.4 mg by mouth 2 (two) times daily.  . Vitamin D, Ergocalciferol, (DRISDOL) 1.25 MG (50000 UT) CAPS capsule Take 50,000 Units by mouth once a week.  . [DISCONTINUED] oxyCODONE (ROXICODONE) 15 MG immediate release tablet Take 1 tablet (15 mg total) by mouth every 6 (six) hours as needed for pain.  Must last 30 days     Allergies:   Lodine [etodolac], Tylenol [acetaminophen], Iodine, and Propoxyphene n-acetaminophen   Social History   Tobacco Use  . Smoking status: Former Smoker    Packs/day: 1.50    Years: 19.00    Pack years: 28.50    Types: Cigarettes    Start date: 03/12/1967    Quit date: 11/12/1988    Years since quitting: 30.5  . Smokeless tobacco: Never Used  Substance Use Topics  . Alcohol use: No    Alcohol/week: 0.0 standard drinks    Comment:    . Drug use: No     Family Hx: The patient's family history includes Cancer in his mother; Coronary artery disease in his mother and sister; Diabetes in his sister; Heart  attack in his brother and father. There is no history of Colon cancer.  ROS:   Please see the history of present illness.     All other systems reviewed and are negative.   Prior CV studies:   The following studies were reviewed today:  Echocardiogram 11/11/2017:  - Left ventricle: The cavity size was mildly dilated. Wall   thickness was normal. The estimated ejection fraction was 55%.   There is hypokinesis of the basalinferior myocardium.   Indeterminate diastolic function. - Aortic valve: Mildly calcified annulus. Trileaflet. - Mitral valve: Mildly calcified annulus. There was trivial   regurgitation. - Tricuspid valve: There was trivial regurgitation. - Pulmonary arteries: Systolic pressure could not be accurately   estimated. - Pericardium, extracardiac: A prominent pericardial fat pad was   present.   Labs/Other Tests and Data Reviewed:    EKG:  No ECG reviewed.  Recent Labs: 06/24/2018: ALT 28; BUN 18; Creatinine, Ser 0.95; Magnesium 1.8; Potassium 5.0; Sodium 135   Recent Lipid Panel Lab Results  Component Value Date/Time   CHOL 183 03/13/2015 07:00 AM   TRIG 191 (H) 03/13/2015 07:00 AM   HDL 24 (L) 03/13/2015 07:00 AM   CHOLHDL 7.6 03/13/2015 07:00 AM   LDLCALC 121 (H) 03/13/2015 07:00 AM   LDLDIRECT 137 (H)  07/03/2009 03:00 PM    Wt Readings from Last 3 Encounters:  05/12/19 254 lb (115.2 kg)  08/10/18 258 lb (117 kg)  03/15/18 258 lb (117 kg)     Objective:    Vital Signs:  Ht 5\' 9"  (1.753 m)   Wt 254 lb (115.2 kg)   BMI 37.51 kg/m    VITAL SIGNS:  reviewed  ASSESSMENT & PLAN:    1. CAD s/p 3-vessel CABG: Symptomatically stable.  Continue ASA, atorvastatin, Entresto 97-103 mg BID, and carvedilol.  2. Ischemic cardiomyopathy: LVEF had been 35% but demonstrated normalization by echocardiogram on 11/11/2017, LVEF now 55% with wall motion abnormalities noted.  Continue Entresto 97-103 mg bid and carvedilol. I will obtain a copy of most recent labs from PCP.  3. Essential HTN: No changes to therapy.  4. Mixed dyslipidemia: Continue high dose Lipitor 80 mg daily. I will repeat lipids as TG 528 on 08/10/18, TC 185, and HDL 32. If TG still elevated, will start Vascepa.    COVID-19 Education: The signs and symptoms of COVID-19 were discussed with the patient and how to seek care for testing (follow up with PCP or arrange E-visit).  The importance of social distancing was discussed today.  Time:   Today, I have spent 5 minutes with the patient with telehealth technology discussing the above problems.     Medication Adjustments/Labs and Tests Ordered: Current medicines are reviewed at length with the patient today.  Concerns regarding medicines are outlined above.   Tests Ordered: No orders of the defined types were placed in this encounter.   Medication Changes: No orders of the defined types were placed in this encounter.   Follow Up:  OV 1 yr  Signed, Kate Sable, MD  05/12/2019 8:33 AM    [place]

## 2019-06-20 ENCOUNTER — Encounter: Payer: Self-pay | Admitting: Pain Medicine

## 2019-06-20 NOTE — Progress Notes (Signed)
Patient: Manuel Knight  Service Category: E/M  Provider: Gaspar Cola, MD  DOB: July 18, 1960  DOS: 06/21/2019  Location: Office  MRN: [id number]  Setting: Ambulatory outpatient  Referring Provider: [facility]  Type: Established Patient  Specialty: Interventional Pain Management  PCP: [facility]  Location: Remote location  Delivery: TeleHealth     Virtual Encounter - Pain Management PROVIDER NOTE: Information contained herein reflects review and annotations entered in association with encounter. Interpretation of such information and data should be left to medically-trained personnel. Information provided to patient can be located elsewhere in the medical record under "Patient Instructions". Document created using STT-dictation technology, any transcriptional errors that may result from process are unintentional.    Contact & Pharmacy Preferred: [phone] Home: [phone] (home) Mobile: [phone] (mobile) E-mail: No e-mail address on record  [place], [place] - [place] [address] Phone: [phone] Fax: [phone]   Pre-screening  Mr. Prows offered "in-person" vs "virtual" encounter. He indicated preferring virtual for this encounter.   Reason COVID-19*   Social distancing based on CDC and AMA recommendations.   I contacted Hansford Hirt on 06/21/2019 via telephone.      I clearly identified myself as Gaspar Cola, MD. I verified that I was speaking with the correct person using two identifiers (Name: Kenzel Ruesch, and date of birth: 11-13-1960).  Consent I sought verbal advanced consent from [name] for virtual visit interactions. I informed Mr. Spradlin of possible security and privacy concerns, risks, and limitations associated with providing "not-in-person" medical evaluation and management services. I also informed Mr. Derrig of the availability of "in-person"  appointments. Finally, I informed him that there would be a charge for the virtual visit and that he could be  personally, fully or partially, financially responsible for it. Mr. Jeziorski expressed understanding and agreed to proceed.   Historic Elements   Mr. Aikam Vinje is a 59 y.o. year old, male patient evaluated today after his last contact with our practice on Visit date not found. Mr. Doswell  has a past medical history of Anxiety, Arthritis, Cellulitis (2011), Chronic back pain, Claustrophobia, COPD (chronic obstructive pulmonary disease) ([place]), Diabetes mellitus, type 2 ([place]), Diverticulitis of colon (2008), GERD (gastroesophageal reflux disease), Heart attack ([place]) (2017), History of kidney stones, Hyperlipidemia, Hypertension, Obesity, and Pulmonary nodule. He also  has a past surgical history that includes Appendectomy (2007); Tonsillectomy; Coronary artery bypass graft (N/A, 02/06/2013); and left heart catheterization with coronary angiogram (N/A, 02/03/2013). Mr. Hevia has a current medication list which includes the following prescription(s): alprazolam, amlodipine, amphetamine-dextroamphetamine, aspirin ec, atorvastatin, azelastine, carvedilol, citalopram, clonidine, fenofibrate, fluticasone, gabapentin, gabapentin, insulin aspart, ipratropium-albuterol, levemir flextouch, magnesium oxide, melatonin, meloxicam, metformin, naloxone, nitroglycerin, [START ON 06/23/2019] oxycodone, [START ON 07/23/2019] oxycodone, [START ON 08/22/2019] oxycodone, pantoprazole, tamsulosin, vitamin d (ergocalciferol), and sacubitril-valsartan. He  reports that he quit smoking about 30 years ago. His smoking use included cigarettes. He started smoking about 52 years ago. He has a 28.50 pack-year smoking history. He has never used smokeless tobacco. He reports that he does not drink alcohol or use drugs. Mr. Cork is allergic to lodine [etodolac]; tylenol [acetaminophen]; iodine; and propoxyphene n-acetaminophen.    HPI  Today, he is being contacted for medication management. The patient indicates doing well with the current medication regimen. No adverse reactions or side effects reported to the medications.  Today the patient indicated that although he is not having any kind of  problems with the medication, "he could use 1 more pill every day".  This statement was followed by my questioning about the medication and it is clear to me that what were dealing with is the issue of tolerance to the opioid analgesic.  Therefore, I did talk to him about the "Drug Holiday".  I took the opportunity to explain the concept of tolerance and how "Drug Holidays" help control it.  I detailed how to do a slow taper to avoid withdrawals.  He understood and we agreed that we need to talk a little bit more about this on his next appointment.  Pharmacotherapy Assessment  Analgesic: Oxycodone IR 15 mg, 1 tab PO q 6 hrs (60 mg/day of oxycodone) MME/day: 90 mg/day.   Monitoring: [place] PMP: PDMP reviewed during this encounter.       Pharmacotherapy: No side-effects or adverse reactions reported. Compliance: No problems identified. Effectiveness: Clinically acceptable. Plan: Refer to "POC".  UDS:  Summary  Date Value Ref Range Status  03/15/2018 FINAL  Final    Comment:    ==================================================================== TOXASSURE SELECT 13 (MW) ==================================================================== Test                             Result       Flag       Units Drug Present not Declared for Prescription Verification   Desmethyldiazepam              148          UNEXPECTED ng/mg creat   Oxazepam                       337          UNEXPECTED ng/mg creat   Temazepam                      581          UNEXPECTED ng/mg creat    Desmethyldiazepam, oxazepam, and temazepam are benzodiazepine    drugs, but may also be present as common metabolites of other    benzodiazepine drugs, including  diazepam.   Alcohol, Ethyl                 0.038        UNEXPECTED g/dL    Sources of ethyl alcohol include alcoholic beverages or as a    fermentation product of glucose; glucose is present in this    specimen.  Interpret result with caution, as the presence of    ethyl alcohol is likely due, at least in part, to fermentation of    glucose. Drug Absent but Declared for Prescription Verification   Amphetamine                    Not Detected UNEXPECTED ng/mg creat   Alprazolam                     Not Detected UNEXPECTED ng/mg creat   Oxycodone                      Not Detected UNEXPECTED ng/mg creat ==================================================================== Test                      Result    Flag   Units      Ref Range   Creatinine  54               mg/dL      >=20 ==================================================================== Declared Medications:  The flagging and interpretation on this report are based on the  following declared medications.  Unexpected results may arise from  inaccuracies in the declared medications.  **Note: The testing scope of this panel includes these medications:  Alprazolam (Xanax)  Amphetamine (Adderall)  Oxycodone (Roxicodone)  **Note: The testing scope of this panel does not include following  reported medications:  Albuterol  Albuterol (Combivent)  Amlodipine (Norvasc)  Aspirin (Aspirin 81)  Atorvastatin (Lipitor)  Canagliflozin (Invokana)  Carvedilol (Coreg)  Citalopram (Celexa)  Clonidine (Catapres)  Fenofibrate  Fluticasone (Advair)  Fluticasone (Flonase)  Gabapentin  Insulin (Levemir)  Insulin (NovoLog)  Ipratropium (Combivent)  Magnesium (Mag-Ox)  Melatonin  Meloxicam (Mobic)  Metformin (Glucophage)  Naloxone (Narcan)  Nitroglycerin  Pantoprazole (Protonix)  Sacubitril (Entresto)  Salmeterol (Advair)  Tiotropium (Spiriva)  Valsartan (Entresto)  Vitamin  D3 ==================================================================== For clinical consultation, please call [phone]. ====================================================================     Laboratory Chemistry Profile   Renal Lab Results  Component Value Date   BUN 18 06/24/2018   CREATININE 0.95 06/24/2018   LABCREA 113.6 01/25/2009   BCR 19 06/04/2016   GFRAA >60 06/24/2018   GFRNONAA >60 06/24/2018     Hepatic Lab Results  Component Value Date   AST 22 06/24/2018   ALT 28 06/24/2018   ALBUMIN 4.0 06/24/2018   ALKPHOS 51 06/24/2018   HCVAB NEGATIVE 11/22/2008   LIPASE 35 11/21/2008     Electrolytes Lab Results  Component Value Date   NA 135 06/24/2018   K 5.0 06/24/2018   CL 100 06/24/2018   CALCIUM 8.8 (L) 06/24/2018   MG 1.8 06/24/2018   PHOS 3.2 03/12/2015     Bone Lab Results  Component Value Date   VD25OH 39.7 06/24/2018   25OHVITD1 31 06/04/2016   25OHVITD2 1.9 06/04/2016   25OHVITD3 29 06/04/2016     Inflammation (CRP: Acute Phase) (ESR: Chronic Phase) Lab Results  Component Value Date   CRP <0.8 06/24/2018   ESRSEDRATE 5 06/24/2018   LATICACIDVEN 1.3 03/12/2015       Note: Above Lab results reviewed.  Imaging  ECHOCARDIOGRAM COMPLETE                            *[facility]*                   [address], [place] [address]                           [place], [place] [address]                            [phone]  ------------------------------------------------------------------- Transthoracic Echocardiography  Patient:    Rowin, Bayron MR #:       [id number] Study Date: 11/11/2017 Gender:     M Age:        60 Height:     175.3 cm Weight:     121.2 kg BSA:        2.48 m^2 Pt. Status: Room:   SONOGRAPHER  [facility]  ATTENDING    Kate Sable, MD  [place], MD  [place], [place],  [place]  cc:  ------------------------------------------------------------------- LV EF: 55%  ------------------------------------------------------------------- Indications:     I25.708  ------------------------------------------------------------------- History:   PMH:   Coronary artery disease.  Chronic obstructive pulmonary disease. NSTEMI.  Risk factors:  Hypertension. Diabetes mellitus. Obese.  ------------------------------------------------------------------- Study Conclusions  - Left ventricle: The cavity size was mildly dilated. Wall   thickness was normal. The estimated ejection fraction was 55%.   There is hypokinesis of the basalinferior myocardium.   Indeterminate diastolic function. - Aortic valve: Mildly calcified annulus. Trileaflet. - Mitral valve: Mildly calcified annulus. There was trivial   regurgitation. - Tricuspid valve: There was trivial regurgitation. - Pulmonary arteries: Systolic pressure could not be accurately   estimated. - Pericardium, extracardiac: A prominent pericardial fat pad was   present.  ------------------------------------------------------------------- Labs, prior tests, procedures, and surgery: Echocardiography (November 2015).     EF was 35%.  Coronary artery bypass grafting.  ------------------------------------------------------------------- Study data:  Comparison was made to the study of November 2015. Study status:  Routine.  Procedure:  The patient reported no pain pre or post test. Transthoracic echocardiography. Image quality was adequate.  Study completion:  There were no complications. Transthoracic echocardiography.  M-mode, complete 2D, spectral Doppler, and color Doppler.  Birthdate:  Patient birthdate: 1960/03/12.  Age:  Patient is 59 yr old.  Sex:  Gender: male. BMI: 39.4 kg/m^2.  Blood pressure:     130/78  Patient status: Outpatient.  Study date:  Study date: 11/11/2017. Study time:  10:47 AM.  -------------------------------------------------------------------  ------------------------------------------------------------------- Left ventricle:  The cavity size was mildly dilated. Wall thickness was normal. The estimated ejection fraction was 55%.  Regional wall motion abnormalities:   There is hypokinesis of the basalinferior myocardium. Indeterminate diastolic function.  ------------------------------------------------------------------- Aortic valve:   Mildly calcified annulus. Trileaflet.  Doppler: There was no significant regurgitation.  ------------------------------------------------------------------- Aorta:  Aortic root: The aortic root was normal in size.  ------------------------------------------------------------------- Mitral valve:   Mildly calcified annulus.  Doppler:  There was trivial regurgitation.  ------------------------------------------------------------------- Left atrium:  The atrium was normal in size.  ------------------------------------------------------------------- Right ventricle:  The cavity size was normal. Systolic function was normal.  ------------------------------------------------------------------- Pulmonic valve:    The valve appears to be grossly normal. Doppler:  There was no significant regurgitation.  ------------------------------------------------------------------- Tricuspid valve:   The valve appears to be grossly normal. Doppler:  There was trivial regurgitation.  ------------------------------------------------------------------- Pulmonary artery:    Systolic pressure could not be accurately estimated.  ------------------------------------------------------------------- Right atrium:  The atrium was normal in size.  ------------------------------------------------------------------- Pericardium:  A prominent pericardial fat pad was  present.  ------------------------------------------------------------------- Systemic veins: Inferior vena cava: Not visualized.  ------------------------------------------------------------------- Measurements   Left ventricle                             Value        Reference  LV ID, ED, PLAX chordal            (H)     62.4  mm     43 - 52  LV ID, ES, PLAX chordal            (H)     43.7  mm     23 - 38  LV fx shortening, PLAX chordal             30    %      >=29  LV PW thickness, ED  10.6  mm     ----------  IVS/LV PW ratio, ED                        0.82         <=1.3  Stroke volume, 2D                          60    ml     ----------  Stroke volume/bsa, 2D                      24    ml/m^2 ----------  LV ejection fraction, 1-p A4C              59    %      ----------  LV end-diastolic volume, 2-p               132   ml     ----------  LV end-systolic volume, 2-p                58    ml     ----------  LV ejection fraction, 2-p                  56    %      ----------  Stroke volume, 2-p                         74    ml     ----------  LV end-diastolic volume/bsa, 2-p           53    ml/m^2 ----------  LV end-systolic volume/bsa, 2-p            24    ml/m^2 ----------  Stroke volume/bsa, 2-p                     29.6  ml/m^2 ----------  LV e&', lateral                             10.3  cm/s   ----------  LV E/e&', lateral                           6.78         ----------  LV e&', medial                              5.03  cm/s   ----------  LV E/e&', medial                            13.88        ----------  LV e&', average                             7.67  cm/s   ----------  LV E/e&', average                           9.11         ----------    Ventricular septum  Value        Reference  IVS thickness, ED                          8.74  mm     ----------    LVOT                                       Value        Reference  LVOT ID,  S                                 21    mm     ----------  LVOT area                                  3.46  cm^2   ----------  LVOT peak velocity, S                      84.4  cm/s   ----------  LVOT mean velocity, S                      60.1  cm/s   ----------  LVOT VTI, S                                17.2  cm     ----------    Aorta                                      Value        Reference  Aortic root ID, ED                         40    mm     ----------    Left atrium                                Value        Reference  LA ID, A-P, ES                             35    mm     ----------  LA ID/bsa, A-P                             1.41  cm/m^2 <=2.2  LA volume, S                               61.9  ml     ----------  LA volume/bsa, S                           24.9  ml/m^2 ----------  LA volume, ES, 1-p A4C  55.3  ml     ----------  LA volume/bsa, ES, 1-p A4C                 22.3  ml/m^2 ----------  LA volume, ES, 1-p A2C                     62.9  ml     ----------  LA volume/bsa, ES, 1-p A2C                 25.3  ml/m^2 ----------    Mitral valve                               Value        Reference  Mitral E-wave peak velocity                69.8  cm/s   ----------  Mitral A-wave peak velocity                83.9  cm/s   ----------  Mitral deceleration time           (H)     298   ms     150 - 230  Mitral E/A ratio, peak                     0.8          ----------  Mitral maximal regurg velocity,            244   cm/s   ----------  PISA    Tricuspid valve                            Value        Reference  Tricuspid regurg peak velocity             100   cm/s   ----------  Tricuspid peak RV-RA gradient              4     mm Hg  ----------    Right atrium                               Value        Reference  RA ID, S-I, ES, A4C                        48.1  mm     34 - 49  RA area, ES, A4C                           14    cm^2   8.3 - 19.5  RA volume, ES, A/L                          33.3  ml     ----------  RA volume/bsa, ES, A/L                     13.4  ml/m^2 ----------    Right ventricle                            Value  Reference  TAPSE                                      17.4  mm     ----------  RV s&', lateral, S                          10.3  cm/s   ----------  Legend: (L)  and  (H)  mark values outside specified reference range.  ------------------------------------------------------------------- Prepared and Electronically Authenticated by  Rozann Lesches, M.D. 2019-10-31T15:25:23  Assessment  The primary encounter diagnosis was Chronic pain syndrome. Diagnoses of Chronic knee pain (Primary Area of Pain) (Bilateral) (L>R), Chronic low back pain (Secondary area of Pain) (Bilateral) (L>R), Chronic lower extremity pain (Third area of Pain) (Bilateral) (R>L), and Pharmacologic therapy were also pertinent to this visit.  Plan of Care  Problem-specific:  No problem-specific Assessment & Plan notes found for this encounter.  Mr. Lamarion Mcevers has a current medication list which includes the following long-term medication(s): amphetamine-dextroamphetamine, azelastine, citalopram, insulin aspart, ipratropium-albuterol, magnesium oxide, metformin, nitroglycerin, [START ON 06/23/2019] oxycodone, [START ON 07/23/2019] oxycodone, and [START ON 08/22/2019] oxycodone.  Pharmacotherapy (Medications Ordered): Meds ordered this encounter  Medications   oxyCODONE (ROXICODONE) 15 MG immediate release tablet    Sig: Take 1 tablet (15 mg total) by mouth every 6 (six) hours as needed for pain. Must last 30 days    Dispense:  120 tablet    Refill:  0    Chronic Pain: STOP Act (Not applicable) Fill 1 day early if closed on refill date. Do not fill until: 06/23/2019. To last until: 07/23/2019. Avoid benzodiazepines within 8 hours of opioids   oxyCODONE (ROXICODONE) 15 MG immediate release tablet    Sig: Take 1 tablet (15 mg total) by mouth every  6 (six) hours as needed for pain. Must last 30 days    Dispense:  120 tablet    Refill:  0    Chronic Pain: STOP Act (Not applicable) Fill 1 day early if closed on refill date. Do not fill until: 07/23/2019. To last until: 08/22/2019. Avoid benzodiazepines within 8 hours of opioids   oxyCODONE (ROXICODONE) 15 MG immediate release tablet    Sig: Take 1 tablet (15 mg total) by mouth every 6 (six) hours as needed for pain. Must last 30 days    Dispense:  120 tablet    Refill:  0    Chronic Pain: STOP Act (Not applicable) Fill 1 day early if closed on refill date. Do not fill until: 08/22/2019. To last until: 09/21/2019. Avoid benzodiazepines within 8 hours of opioids   Orders:  Orders Placed This Encounter  Procedures   ToxASSURE Select 13 (MW), Urine    Volume: 30 ml(s). Minimum 3 ml of urine is needed. Document temperature of fresh sample. Indications: Long term (current) use of opiate analgesic (U72.536)    Order Specific Question:   Release to patient    Answer:   Immediate   Follow-up plan:   Return in about 13 weeks (around 09/20/2019) for (F2F), (MM).      Interventional treatment options: Planned, scheduled, and/or pending:  Diagnostic Right L5-S1 LESI under fluoro and IV sedation.  (PRN)   Under consideration:  NOTE: History of diabetic ketoacidosis Possible bilateral genicular nerve RFA Diagnostic left L4-5 LESI   Therapeutic/palliative (PRN):  Palliativebilateral IA knee injection Palliativebilateral genicular NB  Palliativebilateral lumbar facetblock Palliative rightlumbar facet RFA(Last done 06/15/2016)  Palliative leftlumbar facet RFA(Last done 03/30/2016)  Palliativeleft-sided L4-5 LESI Palliativebilateral SI jointblock  Palliative right SI RFA (Last done 06/15/2016)  Palliative left SI RFA (Last done 03/30/2016)      Recent Visits No visits were found meeting these conditions.  Showing recent visits within past 90 days and meeting all other  requirements   Today's Visits Date Type Provider Dept  06/21/19 Telemedicine Milinda Pointer, MD [facility]  Showing today's visits and meeting all other requirements   Future Appointments No visits were found meeting these conditions.  Showing future appointments within next 90 days and meeting all other requirements   I discussed the assessment and treatment plan with the patient. The patient was provided an opportunity to ask questions and all were answered. The patient agreed with the plan and demonstrated an understanding of the instructions.  Patient advised to call back or seek an in-person evaluation if the symptoms or condition worsens.  Duration of encounter: 13 minutes.  Note by: Gaspar Cola, MD Date: 06/21/2019; Time: 11:38 AM

## 2019-06-21 ENCOUNTER — Ambulatory Visit: Payer: Medicare Other | Attending: Pain Medicine | Admitting: Pain Medicine

## 2019-06-21 ENCOUNTER — Telehealth: Payer: Self-pay | Admitting: *Deleted

## 2019-06-21 ENCOUNTER — Other Ambulatory Visit: Payer: Self-pay

## 2019-06-21 DIAGNOSIS — M79604 Pain in right leg: Secondary | ICD-10-CM | POA: Diagnosis not present

## 2019-06-21 DIAGNOSIS — M545 Low back pain, unspecified: Secondary | ICD-10-CM

## 2019-06-21 DIAGNOSIS — M25561 Pain in right knee: Secondary | ICD-10-CM

## 2019-06-21 DIAGNOSIS — G894 Chronic pain syndrome: Secondary | ICD-10-CM

## 2019-06-21 DIAGNOSIS — M25562 Pain in left knee: Secondary | ICD-10-CM

## 2019-06-21 DIAGNOSIS — G8929 Other chronic pain: Secondary | ICD-10-CM

## 2019-06-21 DIAGNOSIS — Z79899 Other long term (current) drug therapy: Secondary | ICD-10-CM

## 2019-06-21 DIAGNOSIS — M79605 Pain in left leg: Secondary | ICD-10-CM

## 2019-06-21 MED ORDER — OXYCODONE HCL 15 MG PO TABS: 15.0000 mg | ORAL_TABLET | Freq: Four times a day (QID) | ORAL | 0 refills | Status: DC | PRN

## 2019-06-21 NOTE — Patient Instructions (Signed)
____________________________________________________________________________________________  Drug Holidays (Slow)  What is a "Drug Holiday"? Drug Holiday: is the name given to the period of time during which a patient stops taking a medication(s) for the purpose of eliminating tolerance to the drug.  Benefits . Improved effectiveness of opioids. . Decreased opioid dose needed to achieve benefits. . Improved pain with lesser dose.  What is tolerance? Tolerance: is the progressive decreased in effectiveness of a drug due to its repetitive use. With repetitive use, the body gets use to the medication and as a consequence, it loses its effectiveness. This is a common problem seen with opioid pain medications. As a result, a larger dose of the drug is needed to achieve the same effect that used to be obtained with a smaller dose.  How long should a "Drug Holiday" last? You should stay off of the pain medicine for at least 14 consecutive days. (2 weeks)  Should I stop the medicine "cold turkey"? No. You should always coordinate with your Pain Specialist so that he/she can provide you with the correct medication dose to make the transition as smoothly as possible.  How do I stop the medicine? Slowly. You will be instructed to decrease the daily amount of pills that you take by one (1) pill every seven (7) days. This is called a "slow downward taper" of your dose. For example: if you normally take four (4) pills per day, you will be asked to drop this dose to three (3) pills per day for seven (7) days, then to two (2) pills per day for seven (7) days, then to one (1) per day for seven (7) days, and at the end of those last seven (7) days, this is when the "Drug Holiday" would start.   Will I have withdrawals? By doing a "slow downward taper" like this one, it is unlikely that you will experience any significant withdrawal symptoms. Typically, what triggers withdrawals is the sudden stop of a high  dose opioid therapy. Withdrawals can usually be avoided by slowly decreasing the dose over a prolonged period of time.  What are withdrawals? Withdrawals: refers to the wide range of symptoms that occur after stopping or dramatically reducing opiate drugs after heavy and prolonged use. Withdrawal symptoms do not occur to patients that use low dose opioids, or those who take the medication sporadically. Contrary to benzodiazepine (example: Valium, Xanax, etc.) or alcohol withdrawals ("Delirium Tremens"), opioid withdrawals are not lethal. Withdrawals are the physical manifestation of the body getting rid of the excess receptors.  Expected Symptoms Early symptoms of withdrawal may include: . Agitation . Anxiety . Muscle aches . Increased tearing . Insomnia . Runny nose . Sweating . Yawning  Late symptoms of withdrawal may include: . Abdominal cramping . Diarrhea . Dilated pupils . Goose bumps . Nausea . Vomiting  Will I experience withdrawals? Due to the slow nature of the taper, it is very unlikely that you will experience any.  What is a slow taper? Taper: refers to the gradual decrease in dose.  ___________________________________________________________________________________________     

## 2019-06-29 LAB — TOXASSURE SELECT 13 (MW), URINE

## 2019-08-21 ENCOUNTER — Institutional Professional Consult (permissible substitution): Payer: Medicare Other | Admitting: Internal Medicine

## 2019-09-20 ENCOUNTER — Encounter: Payer: Self-pay | Admitting: Pain Medicine

## 2019-09-20 ENCOUNTER — Ambulatory Visit: Payer: Medicare Other | Attending: Pain Medicine | Admitting: Pain Medicine

## 2019-09-20 ENCOUNTER — Other Ambulatory Visit: Payer: Self-pay

## 2019-09-20 VITALS — BP 108/69 | HR 67 | Temp 96.5°F | Resp 18 | Ht 70.0 in | Wt 251.0 lb

## 2019-09-20 DIAGNOSIS — M25562 Pain in left knee: Secondary | ICD-10-CM | POA: Diagnosis present

## 2019-09-20 DIAGNOSIS — I255 Ischemic cardiomyopathy: Secondary | ICD-10-CM

## 2019-09-20 DIAGNOSIS — M25561 Pain in right knee: Secondary | ICD-10-CM | POA: Diagnosis present

## 2019-09-20 DIAGNOSIS — M545 Low back pain, unspecified: Secondary | ICD-10-CM

## 2019-09-20 DIAGNOSIS — F1120 Opioid dependence, uncomplicated: Secondary | ICD-10-CM | POA: Diagnosis present

## 2019-09-20 DIAGNOSIS — M79604 Pain in right leg: Secondary | ICD-10-CM | POA: Insufficient documentation

## 2019-09-20 DIAGNOSIS — G894 Chronic pain syndrome: Secondary | ICD-10-CM | POA: Diagnosis present

## 2019-09-20 DIAGNOSIS — G8929 Other chronic pain: Secondary | ICD-10-CM | POA: Diagnosis present

## 2019-09-20 DIAGNOSIS — M79605 Pain in left leg: Secondary | ICD-10-CM

## 2019-09-20 DIAGNOSIS — Z79899 Other long term (current) drug therapy: Secondary | ICD-10-CM | POA: Diagnosis present

## 2019-09-20 MED ORDER — OXYCODONE HCL 15 MG PO TABS: 15.0000 mg | ORAL_TABLET | Freq: Four times a day (QID) | ORAL | 0 refills | Status: AC | PRN

## 2019-09-20 MED ORDER — OXYCODONE HCL 15 MG PO TABS: 15.0000 mg | ORAL_TABLET | Freq: Four times a day (QID) | ORAL | 0 refills | Status: DC | PRN

## 2019-09-20 NOTE — Patient Instructions (Signed)
____________________________________________________________________________________________  Drug Holidays (Slow)  What is a "Drug Holiday"? Drug Holiday: is the name given to the period of time during which a patient stops taking a medication(s) for the purpose of eliminating tolerance to the drug.  Benefits . Improved effectiveness of opioids. . Decreased opioid dose needed to achieve benefits. . Improved pain with lesser dose.  What is tolerance? Tolerance: is the progressive decreased in effectiveness of a drug due to its repetitive use. With repetitive use, the body gets use to the medication and as a consequence, it loses its effectiveness. This is a common problem seen with opioid pain medications. As a result, a larger dose of the drug is needed to achieve the same effect that used to be obtained with a smaller dose.  How long should a "Drug Holiday" last? You should stay off of the pain medicine for at least 14 consecutive days. (2 weeks)  Should I stop the medicine "cold turkey"? No. You should always coordinate with your Pain Specialist so that he/she can provide you with the correct medication dose to make the transition as smoothly as possible.  How do I stop the medicine? Slowly. You will be instructed to decrease the daily amount of pills that you take by one (1) pill every seven (7) days. This is called a "slow downward taper" of your dose. For example: if you normally take four (4) pills per day, you will be asked to drop this dose to three (3) pills per day for seven (7) days, then to two (2) pills per day for seven (7) days, then to one (1) per day for seven (7) days, and at the end of those last seven (7) days, this is when the "Drug Holiday" would start.   Will I have withdrawals? By doing a "slow downward taper" like this one, it is unlikely that you will experience any significant withdrawal symptoms. Typically, what triggers withdrawals is the sudden stop of a high  dose opioid therapy. Withdrawals can usually be avoided by slowly decreasing the dose over a prolonged period of time. If you do not follow these instructions and decide to stop your medication abruptly, withdrawals may be possible.  What are withdrawals? Withdrawals: refers to the wide range of symptoms that occur after stopping or dramatically reducing opiate drugs after heavy and prolonged use. Withdrawal symptoms do not occur to patients that use low dose opioids, or those who take the medication sporadically. Contrary to benzodiazepine (example: Valium, Xanax, etc.) or alcohol withdrawals ("Delirium Tremens"), opioid withdrawals are not lethal. Withdrawals are the physical manifestation of the body getting rid of the excess receptors.  Expected Symptoms Early symptoms of withdrawal may include: . Agitation . Anxiety . Muscle aches . Increased tearing . Insomnia . Runny nose . Sweating . Yawning  Late symptoms of withdrawal may include: . Abdominal cramping . Diarrhea . Dilated pupils . Goose bumps . Nausea . Vomiting  Will I experience withdrawals? Due to the slow nature of the taper, it is very unlikely that you will experience any.  What is a slow taper? Taper: refers to the gradual decrease in dose.  (Last update: 08/02/2019) ____________________________________________________________________________________________    ____________________________________________________________________________________________  Medication Rules  Purpose: To inform patients, and their family members, of our rules and regulations.  Applies to: All patients receiving prescriptions (written or electronic).  Pharmacy of record: Pharmacy where electronic prescriptions will be sent. If written prescriptions are taken to a different pharmacy, please inform the nursing staff. The pharmacy   listed in the electronic medical record should be the one where you would like electronic prescriptions  to be sent.  Electronic prescriptions: In compliance with the [place] Strengthen Opioid Misuse Prevention (STOP) Act of 2017 (Session Law 2017-74/H243), effective January 12, 2018, all controlled substances must be electronically prescribed. Calling prescriptions to the pharmacy will cease to exist.  Prescription refills: Only during scheduled appointments. Applies to all prescriptions.  NOTE: The following applies primarily to controlled substances (Opioid* Pain Medications).   Type of encounter (visit): For patients receiving controlled substances, face-to-face visits are required. (Not an option or up to the patient.)  Patient's responsibilities: 1. Pain Pills: Bring all pain pills to every appointment (except for procedure appointments). 2. Pill Bottles: Bring pills in original pharmacy bottle. Always bring the newest bottle. Bring bottle, even if empty. 3. Medication refills: You are responsible for knowing and keeping track of what medications you take and those you need refilled. The day before your appointment: write a list of all prescriptions that need to be refilled. The day of the appointment: give the list to the admitting nurse. Prescriptions will be written only during appointments. No prescriptions will be written on procedure days. If you forget a medication: it will not be "Called in", "Faxed", or "electronically sent". You will need to get another appointment to get these prescribed. No early refills. Do not call asking to have your prescription filled early. 4. Prescription Accuracy: You are responsible for carefully inspecting your prescriptions before leaving our office. Have the discharge nurse carefully go over each prescription with you, before taking them home. Make sure that your name is accurately spelled, that your address is correct. Check the name and dose of your medication to make sure it is accurate. Check the number of pills, and the written instructions to  make sure they are clear and accurate. Make sure that you are given enough medication to last until your next medication refill appointment. 5. Taking Medication: Take medication as prescribed. When it comes to controlled substances, taking less pills or less frequently than prescribed is permitted and encouraged. Never take more pills than instructed. Never take medication more frequently than prescribed.  6. Inform other Doctors: Always inform, all of your healthcare providers, of all the medications you take. 7. Pain Medication from other Providers: You are not allowed to accept any additional pain medication from any other Doctor or Healthcare provider. There are two exceptions to this rule. (see below) In the event that you require additional pain medication, you are responsible for notifying us, as stated below. 8. Medication Agreement: You are responsible for carefully reading and following our Medication Agreement. This must be signed before receiving any prescriptions from our practice. Safely store a copy of your signed Agreement. Violations to the Agreement will result in no further prescriptions. (Additional copies of our Medication Agreement are available upon request.) 9. Laws, Rules, & Regulations: All patients are expected to follow all Federal and State Laws, Statutes, Rules, & Regulations. Ignorance of the Laws does not constitute a valid excuse.  10. Illegal drugs and Controlled Substances: The use of illegal substances (including, but not limited to marijuana and its derivatives) and/or the illegal use of any controlled substances is strictly prohibited. Violation of this rule may result in the immediate and permanent discontinuation of any and all prescriptions being written by our practice. The use of any illegal substances is prohibited. 11. Adopted CDC guidelines & recommendations: Target dosing levels will be at or   below 60 MME/day. Use of benzodiazepines** is not  recommended.  Exceptions: There are only two exceptions to the rule of not receiving pain medications from other Healthcare Providers. 1. Exception #1 (Emergencies): In the event of an emergency (i.e.: accident requiring emergency care), you are allowed to receive additional pain medication. However, you are responsible for: As soon as you are able, call our office [phone], at any time of the day or night, and leave a message stating your name, the date and nature of the emergency, and the name and dose of the medication prescribed. In the event that your call is answered by a member of our staff, make sure to document and save the date, time, and the name of the person that took your information.  2. Exception #2 (Planned Surgery): In the event that you are scheduled by another doctor or dentist to have any type of surgery or procedure, you are allowed (for a period no longer than 30 days), to receive additional pain medication, for the acute post-op pain. However, in this case, you are responsible for picking up a copy of our "Post-op Pain Management for Surgeons" handout, and giving it to your surgeon or dentist. This document is available at our office, and does not require an appointment to obtain it. Simply go to our office during business hours (Monday-Thursday from 8:00 AM to 4:00 PM) (Friday 8:00 AM to 12:00 Noon) or if you have a scheduled appointment with [place], prior to your surgery, and ask for it by name. In addition, you are responsible for: calling our office [phone], at any time of the day or night, and leaving a message stating your name, name of your surgeon, type of surgery, and date of procedure or surgery. Failure to comply with your responsibilities may result in termination of therapy involving the controlled substances.  *Opioid medications include: morphine, codeine, oxycodone, oxymorphone, hydrocodone, hydromorphone, meperidine, tramadol, tapentadol, buprenorphine,  fentanyl, methadone. **Benzodiazepine medications include: diazepam (Valium), alprazolam (Xanax), clonazepam (Klonopine), lorazepam (Ativan), clorazepate (Tranxene), chlordiazepoxide (Librium), estazolam (Prosom), oxazepam (Serax), temazepam (Restoril), triazolam (Halcion) (Last updated: 09/19/2019) ____________________________________________________________________________________________   ____________________________________________________________________________________________  Medication Recommendations and Reminders  Applies to: All patients receiving prescriptions (written and/or electronic).  Medication Rules & Regulations: These rules and regulations exist for your safety and that of others. They are not flexible and neither are we. Dismissing or ignoring them will be considered "non-compliance" with medication therapy, resulting in complete and irreversible termination of such therapy. (See document titled "Medication Rules" for more details.) In all conscience, because of safety reasons, we cannot continue providing a therapy where the patient does not follow instructions.  Pharmacy of record:   Definition: This is the pharmacy where your electronic prescriptions will be sent.   We do not endorse any particular pharmacy, however, we have experienced problems with [facility] not securing enough medication supply for the community.  We do not restrict you in your choice of pharmacy. However, once we write for your prescriptions, we will NOT be re-sending more prescriptions to fix restricted supply problems created by your pharmacy, or your insurance.   The pharmacy listed in the electronic medical record should be the one where you want electronic prescriptions to be sent.  If you choose to change pharmacy, simply notify our nursing staff.  Recommendations:  Keep all of your pain medications in a safe place, under lock and key, even if you live alone. We will NOT replace lost,  stolen, or damaged medication.  After  you fill your prescription, take 1 week's worth of pills and put them away in a safe place. You should keep a separate, properly labeled bottle for this purpose. The remainder should be kept in the original bottle. Use this as your primary supply, until it runs out. Once it's gone, then you know that you have 1 week's worth of medicine, and it is time to come in for a prescription refill. If you do this correctly, it is unlikely that you will ever run out of medicine.  To make sure that the above recommendation works, it is very important that you make sure your medication refill appointments are scheduled at least 1 week before you run out of medicine. To do this in an effective manner, make sure that you do not leave the office without scheduling your next medication management appointment. Always ask the nursing staff to show you in your prescription , when your medication will be running out. Then arrange for the receptionist to get you a return appointment, at least 7 days before you run out of medicine. Do not wait until you have 1 or 2 pills left, to come in. This is very poor planning and does not take into consideration that we may need to cancel appointments due to bad weather, sickness, or emergencies affecting our staff.  DO NOT ACCEPT A "Partial Fill": If for any reason your pharmacy does not have enough pills/tablets to completely fill or refill your prescription, do not allow for a "partial fill". The law allows the pharmacy to complete that prescription within 72 hours, without requiring a new prescription. If they do not fill the rest of your prescription within those 72 hours, you will need a separate prescription to fill the remaining amount, which we will NOT provide. If the reason for the partial fill is your insurance, you will need to talk to the pharmacist about payment alternatives for the remaining tablets, but again, DO NOT ACCEPT A PARTIAL FILL,  unless you can trust your pharmacist to obtain the remainder of the pills within 72 hours.  Prescription refills and/or changes in medication(s):   Prescription refills, and/or changes in dose or medication, will be conducted only during scheduled medication management appointments. (Applies to both, written and electronic prescriptions.)  No refills on procedure days. No medication will be changed or started on procedure days. No changes, adjustments, and/or refills will be conducted on a procedure day. Doing so will interfere with the diagnostic portion of the procedure.  No phone refills. No medications will be "called into the pharmacy".  No Fax refills.  No weekend refills.  No Holliday refills.  No after hours refills.  Remember:  Business hours are:  Monday to Thursday 8:00 AM to 4:00 PM Provider's Schedule: Milinda Pointer, MD - Appointments are:  Medication management: Monday and Wednesday 8:00 AM to 4:00 PM Procedure day: Tuesday and Thursday 7:30 AM to 4:00 PM Gillis Santa, MD - Appointments are:  Medication management: Tuesday and Thursday 8:00 AM to 4:00 PM Procedure day: Monday and Wednesday 7:30 AM to 4:00 PM (Last update: 08/02/2019) ____________________________________________________________________________________________   ____________________________________________________________________________________________  CBD (cannabidiol) WARNING  Applicable to: All individuals currently taking or considering taking CBD (cannabidiol) and, more important, all patients taking opioid analgesic controlled substances (pain medication). (Example: oxycodone; oxymorphone; hydrocodone; hydromorphone; morphine; methadone; tramadol; tapentadol; fentanyl; buprenorphine; butorphanol; dextromethorphan; meperidine; codeine; etc.)  Legal status: CBD remains a Schedule I drug prohibited for any use. CBD is illegal with one exception. In the  [place], CBD has a Armed forces training and education officer (FDA) approval for the treatment of two specific types of epilepsy disorders. Only one CBD product has been approved by the FDA for this purpose: "Epidiolex". FDA is aware that some companies are marketing products containing cannabis and cannabis-derived compounds in ways that violate the [employer], Drug and Cosmetic Act [facility] Act) and that may put the health and safety of consumers at risk. The FDA, a Federal agency, has not enforced the CBD status since 2018.   Legality: Some manufacturers ship CBD products nationally, which is illegal. Often such products are sold online and are therefore available throughout the country. CBD is openly sold in head shops and health food stores in some states where such sales have not been explicitly legalized. Selling unapproved products with unsubstantiated therapeutic claims is not only a violation of the law, but also can put patients at risk, as these products have not been proven to be safe or effective. Federal illegality makes it difficult to conduct research on CBD.  Reference: "FDA Regulation of Cannabis and Cannabis-Derived Products, Including Cannabidiol (CBD)" - [employer]  Warning: CBD is not FDA approved and has not undergo the same manufacturing controls as prescription drugs.  This means that the purity and safety of available CBD may be questionable. Most of the time, despite manufacturer's claims, it is contaminated with THC (delta-9-tetrahydrocannabinol - the chemical in marijuana responsible for the "HIGH").  When this is the case, the [facility] contaminant will trigger a positive urine drug screen (UDS) test for Marijuana (carboxy-THC). Because a positive UDS for any illicit substance is a violation of our medication agreement, your opioid analgesics (pain medicine) may be permanently discontinued.  MORE ABOUT  CBD  General Information: CBD  is a derivative of the Marijuana (cannabis sativa) plant discovered in 81. It is one of the 113 identified substances found in Marijuana. It accounts for up to 40% of the plant's extract. As of 2018, preliminary clinical studies on CBD included research for the treatment of anxiety, movement disorders, and pain. CBD is available and consumed in multiple forms, including inhalation of smoke or vapor, as an aerosol spray, and by mouth. It may be supplied as an oil containing CBD, capsules, dried cannabis, or as a liquid solution. CBD is thought not to be as psychoactive as THC (delta-9-tetrahydrocannabinol - the chemical in marijuana responsible for the "HIGH"). Studies suggest that CBD may interact with different biological target receptors in the body, including cannabinoid and other neurotransmitter receptors. As of 2018 the mechanism of action for its biological effects has not been determined.  Side-effects  Adverse reactions: Dry mouth, diarrhea, decreased appetite, fatigue, drowsiness, malaise, weakness, sleep disturbances, and others.  Drug interactions: CBC may interact with other medications such as blood-thinners. (Last update: 08/19/2019) ____________________________________________________________________________________________

## 2019-09-20 NOTE — Progress Notes (Signed)
PROVIDER NOTE: Information contained herein reflects review and annotations entered in association with encounter. Interpretation of such information and data should be left to medically-trained personnel. Information provided to patient can be located elsewhere in the medical record under "Patient Instructions". Document created using STT-dictation technology, any transcriptional errors that may result from process are unintentional.    Patient: Manuel Knight  Service Category: E/M  Provider: Gaspar Cola, MD  DOB: [birth year]  DOS: 09/20/2019  Specialty: Interventional Pain Management  MRN: [id number]  Setting: Ambulatory outpatient  PCP: [facility]  Type: Established Patient    Referring Provider: [facility]  Location: Office  Delivery: Face-to-face     HPI  Reason for encounter: Manuel Knight, a 59 y.o. year old male, is here today for evaluation and management of his Chronic pain syndrome [G89.4]. Manuel Knight primary complain today is Back Pain and Knee Pain Last encounter: Practice (06/21/2019). My last encounter with him was on Visit date not found. Pertinent problems: Manuel Knight has Abdominal pain; Lumbar spondylosis with lumbar radiculitis (Left); Chronic low back pain (2ry area of Pain) (Bilateral) (L>R); Chronic knee pain (1ry area of Pain) (Bilateral) (L>R); Chronic foot pain (Bilateral) (L>R); Diabetic peripheral neuropathy (HCC) (lower extremity); Chronic lumbar radicular pain (L5 dermatome) (Right); Chronic sacroiliac joint pain (Bilateral) (L>R); Osteoarthritis of knee (Bilateral) (L>R); Lumbar facet syndrome (Bilateral) (L>R); Lumbar facet arthropathy; Chronic lower extremity pain (3ry area of Pain) (Bilateral) (R>L); Chronic pain syndrome; Spondylosis of lumbar spine; and Chronic musculoskeletal pain on their pertinent problem list. Pain Assessment: Severity of Chronic pain is reported as a 7 /10. Location: Back Left/left leg all the  way to the big toe.. Onset: More than a month ago. Quality: Constant, Radiating, Pressure. Timing: Constant. Modifying factor(s): medicines. Vitals:  height is '5\' 10"'  (1.778 m) and weight is 251 lb (113.9 kg). His temporal temperature is 96.5 F (35.8 C) (abnormal). His blood pressure is 108/69 and his pulse is 67. His respiration is 18 and oxygen saturation is 99%.   Today the patient indicates that his primary pain is in the lower back, bilaterally, with the left being worse than the right.  His secondary pain is that of the lower extremities, bilaterally, with the left being worse than the right.  In the case of the left lower extremity the pain goes all the way down to the top of the foot and the big toe and what seems to be an L5 dermatomal distribution.  In the case of the right lower extremity the pain goes down to the knee but it does not go any further below that.  He does have pain in the right buttocks area, likely secondary to facet disease.  Provocative physical exam today on hyperextension on rotation Lynelle Smoke test) was positive for bilateral lumbar facet disease.  Today I offered to bring him back for interventional therapies to bring this pain down however, he turned this down.  I have been following this patient since before 06/13/2015.  His last procedure was done on 06/15/2016.  He currently takes oxycodone IR 15 mg, 1 tablet p.o. every 6 hours (60 mg/day of oxycodone) (90 MME).  PMP compliant.  UDS compliant.  Last lumbar MRI done on 06/21/2015.  The patient is on oxycodone IR 15 mg 1 tablet p.o. every 6 hours (60 mg/day of oxycodone) (90 MME).  The patient was informed and warned about the oxycodone national shortage.  He appears to have experienced no problems  obtaining their 15 mg tablet.  He wants to stay with the same medication at same dose.  Transfer: Magnesium oxide 500 mg tablet, 1 tablet p.o. daily (30/month) (12/05/2019)  Pharmacotherapy Assessment   Analgesic: Oxycodone IR 15 mg,  1 tab PO q 6 hrs (60 mg/day of oxycodone) MME/day: 90 mg/day.   Monitoring: [place] PMP: PDMP reviewed during this encounter.       Pharmacotherapy: No side-effects or adverse reactions reported. Compliance: No problems identified. Effectiveness: Clinically acceptable.  [name], RN  09/20/2019 10:46 AM  Sign when Signing Visit Nursing Pain Medication Assessment:  Safety precautions to be maintained throughout the outpatient stay will include: orient to surroundings, keep bed in low position, maintain call bell within reach at all times, provide assistance with transfer out of bed and ambulation.  Medication Inspection Compliance: Pill count conducted under aseptic conditions, in front of the patient. Neither the pills nor the bottle was removed from the patient's sight at any time. Once count was completed pills were immediately returned to the patient in their original bottle.  Medication: Oxycodone IR Pill/Patch Count: 6 of 120 pills remain Pill/Patch Appearance: Markings consistent with prescribed medication Bottle Appearance: Standard pharmacy container. Clearly labeled. Filled Date: 8 / 59 / 96 Last Medication intake:  TodaySafety precautions to be maintained throughout the outpatient stay will include: orient to surroundings, keep bed in low position, maintain call bell within reach at all times, provide assistance with transfer out of bed and ambulation.     UDS:  Summary  Date Value Ref Range Status  06/22/2019 Note  Corrected    Comment:    ==================================================================== [employer] [facility] 13 (MW) ==================================================================== Test                             Result       Flag       Units  Drug Present and Declared for Prescription Verification   Oxycodone                      878          EXPECTED   ng/mg creat   Oxymorphone                    470          EXPECTED   ng/mg creat   Noroxycodone                    435          EXPECTED   ng/mg creat    Sources of oxycodone include scheduled prescription medications.    Oxymorphone and noroxycodone are expected metabolites of oxycodone.    Oxymorphone is also available as a scheduled prescription medication.  Drug Absent but Declared for Prescription Verification   Amphetamine                    Not Detected UNEXPECTED ng/mg creat   Alprazolam                     Not Detected UNEXPECTED ng/mg creat ==================================================================== Test                      Result    Flag   Units      Ref Range   Creatinine  23               mg/dL      >=20 ==================================================================== Declared Medications:  The flagging and interpretation on this report are based on the  following declared medications.  Unexpected results may arise from  inaccuracies in the declared medications.   **Note: The testing scope of this panel includes these medications:   Alprazolam (Xanax)  Amphetamine (Adderall)  Oxycodone (Roxicodone)   **Note: The testing scope of this panel does not include the  following reported medications:   Albuterol (Combivent)  Amlodipine (Norvasc)  Aspirin  Atorvastatin (Lipitor)  Azelastine (Astelin)  Carvedilol (Coreg)  Citalopram (Celexa)  Clonidine (Catapres)  Fenofibrate  Fluticasone (Flonase)  Gabapentin (Neurontin)  Insulin  Ipratropium (Combivent)  Magnesium (Mag-Ox)  Melatonin  Meloxicam (Mobic)  Metformin (Glucophage)  Naloxone (Narcan)  Nitroglycerin (Nitrostat)  Pantoprazole (Protonix)  Sacubitril (Entresto)  Tamsulosin (Flomax)  Valsartan (Entresto)  Vitamin D2 (Drisdol) ==================================================================== For clinical consultation, please call [phone]. ====================================================================      ROS  Constitutional: Denies any fever or  chills Gastrointestinal: No reported hemesis, hematochezia, vomiting, or acute GI distress Musculoskeletal: Denies any acute onset joint swelling, redness, loss of ROM, or weakness Neurological: No reported episodes of acute onset apraxia, aphasia, dysarthria, agnosia, amnesia, paralysis, loss of coordination, or loss of consciousness  Medication Review  Ipratropium-Albuterol, Magnesium Oxide, Melatonin, Vitamin D (Ergocalciferol), alprazolam, amLODipine, amphetamine-dextroamphetamine, aspirin EC, atorvastatin, azelastine, carvedilol, citalopram, cloNIDine, fenofibrate, fluticasone, gabapentin, insulin aspart, insulin detemir, meloxicam, metFORMIN, naloxone, nitroGLYCERIN, oxyCODONE, pantoprazole, sacubitril-valsartan, and tamsulosin  History Review  Allergy: Manuel Knight is allergic to lodine [etodolac], tylenol [acetaminophen], iodine, and propoxyphene n-acetaminophen. Drug: Manuel Knight  reports no history of drug use. Alcohol:  reports no history of alcohol use. Tobacco:  reports that he quit smoking about 30 years ago. His smoking use included cigarettes. He started smoking about 52 years ago. He has a 28.50 pack-year smoking history. He has never used smokeless tobacco. Social: Manuel Knight  reports that he quit smoking about 30 years ago. His smoking use included cigarettes. He started smoking about 52 years ago. He has a 28.50 pack-year smoking history. He has never used smokeless tobacco. He reports that he does not drink alcohol and does not use drugs. Medical:  has a past medical history of Anxiety, Arthritis, Cellulitis (2011), Chronic back pain, Claustrophobia, COPD (chronic obstructive pulmonary disease) ([place]), Diabetes mellitus, type 2 ([place]), Diverticulitis of colon (2008), GERD (gastroesophageal reflux disease), Heart attack ([place]) (2017), History of kidney stones, Hyperlipidemia, Hypertension, Obesity, and Pulmonary nodule. 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 Family: family history includes Cancer in his mother; Coronary artery disease in his mother and sister; Diabetes in his sister; Heart attack in his brother and father.  Laboratory Chemistry Profile   Renal Lab Results  Component Value Date   BUN 18 06/24/2018   CREATININE 0.95 06/24/2018   LABCREA 113.6 01/25/2009   BCR 19 06/04/2016   GFRAA >60 06/24/2018   GFRNONAA >60 06/24/2018     Hepatic Lab Results  Component Value Date   AST 22 06/24/2018   ALT 28 06/24/2018   ALBUMIN 4.0 06/24/2018   ALKPHOS 51 06/24/2018   HCVAB NEGATIVE 11/22/2008   LIPASE 35 11/21/2008     Electrolytes Lab Results  Component Value Date   NA 135 06/24/2018   K 5.0 06/24/2018  CL 100 06/24/2018   CALCIUM 8.8 (L) 06/24/2018   MG 1.8 06/24/2018   PHOS 3.2 03/12/2015     Bone Lab Results  Component Value Date   VD25OH 39.7 06/24/2018   25OHVITD1 31 06/04/2016   25OHVITD2 1.9 06/04/2016   25OHVITD3 29 06/04/2016     Inflammation (CRP: Acute Phase) (ESR: Chronic Phase) Lab Results  Component Value Date   CRP <0.8 06/24/2018   ESRSEDRATE 5 06/24/2018   LATICACIDVEN 1.3 03/12/2015       Note: Above Lab results reviewed.  Recent Imaging Review  ECHOCARDIOGRAM COMPLETE                            *[facility]*                   [address], [address]                           [place], [place] [address]                            [phone]  ------------------------------------------------------------------- Transthoracic Echocardiography  Patient:    Manuel Knight, Manuel Knight MR #:       [id number] Study Date: 11/11/2017 Gender:     M Age:        69 Height:     175.3 cm Weight:     121.2 kg BSA:        2.48 m^2 Pt. Status: Room:   SONOGRAPHER  [facility]  ATTENDING    Kate Sable, MD  [place], MD   [place], MD  PERFORMING   [facility]  cc:  ------------------------------------------------------------------- LV EF: 55%  ------------------------------------------------------------------- Indications:     I25.708  ------------------------------------------------------------------- History:   PMH:   Coronary artery disease.  Chronic obstructive pulmonary disease. NSTEMI.  Risk factors:  Hypertension. Diabetes mellitus. Obese.  ------------------------------------------------------------------- Study Conclusions  - Left ventricle: The cavity size was mildly dilated. Wall   thickness was normal. The estimated ejection fraction was 55%.   There is hypokinesis of the basalinferior myocardium.   Indeterminate diastolic function. - Aortic valve: Mildly calcified annulus. Trileaflet. - Mitral valve: Mildly calcified annulus. There was trivial   regurgitation. - Tricuspid valve: There was trivial regurgitation. - Pulmonary arteries: Systolic pressure could not be accurately   estimated. - Pericardium, extracardiac: A prominent pericardial fat pad was   present.  ------------------------------------------------------------------- Labs, prior tests, procedures, and surgery: Echocardiography (November 2015).     EF was 35%.  Coronary artery bypass grafting.  ------------------------------------------------------------------- Study data:  Comparison was made to the study of November 2015. Study status:  Routine.  Procedure:  The patient reported no pain pre or post test. Transthoracic echocardiography. Image quality was adequate.  Study completion:  There were no complications. Transthoracic echocardiography.  M-mode, complete 2D, spectral Doppler, and color Doppler.  Birthdate:  Patient birthdate: [birth year].  Age:  Patient is 59 yr old.  Sex:  Gender: male. BMI: 39.4 kg/m^2.  Blood pressure:     130/78  Patient status: Outpatient.  Study date:  Study  date: 11/11/2017. Study time: 10:47 AM.  -------------------------------------------------------------------  ------------------------------------------------------------------- Left ventricle:  The cavity size was mildly dilated. Wall thickness was normal. The estimated ejection fraction was 55%.  Regional wall motion abnormalities:   There  is hypokinesis of the basalinferior myocardium. Indeterminate diastolic function.  ------------------------------------------------------------------- Aortic valve:   Mildly calcified annulus. Trileaflet.  Doppler: There was no significant regurgitation.  ------------------------------------------------------------------- Aorta:  Aortic root: The aortic root was normal in size.  ------------------------------------------------------------------- Mitral valve:   Mildly calcified annulus.  Doppler:  There was trivial regurgitation.  ------------------------------------------------------------------- Left atrium:  The atrium was normal in size.  ------------------------------------------------------------------- Right ventricle:  The cavity size was normal. Systolic function was normal.  ------------------------------------------------------------------- Pulmonic valve:    The valve appears to be grossly normal. Doppler:  There was no significant regurgitation.  ------------------------------------------------------------------- Tricuspid valve:   The valve appears to be grossly normal. Doppler:  There was trivial regurgitation.  ------------------------------------------------------------------- Pulmonary artery:    Systolic pressure could not be accurately estimated.  ------------------------------------------------------------------- Right atrium:  The atrium was normal in size.  ------------------------------------------------------------------- Pericardium:  A prominent pericardial fat pad was  present.  ------------------------------------------------------------------- Systemic veins: Inferior vena cava: Not visualized.  ------------------------------------------------------------------- Measurements   Left ventricle                             Value        Reference  LV ID, ED, PLAX chordal            (H)     62.4  mm     43 - 52  LV ID, ES, PLAX chordal            (H)     43.7  mm     23 - 38  LV fx shortening, PLAX chordal             30    %      >=29  LV PW thickness, ED                        10.6  mm     ----------  IVS/LV PW ratio, ED                        0.82         <=1.3  Stroke volume, 2D                          60    ml     ----------  Stroke volume/bsa, 2D                      24    ml/m^2 ----------  LV ejection fraction, 1-p A4C              59    %      ----------  LV end-diastolic volume, 2-p               132   ml     ----------  LV end-systolic volume, 2-p                58    ml     ----------  LV ejection fraction, 2-p                  56    %      ----------  Stroke volume, 2-p                         74  ml     ----------  LV end-diastolic volume/bsa, 2-p           53    ml/m^2 ----------  LV end-systolic volume/bsa, 2-p            24    ml/m^2 ----------  Stroke volume/bsa, 2-p                     29.6  ml/m^2 ----------  LV e&', lateral                             10.3  cm/s   ----------  LV E/e&', lateral                           6.78         ----------  LV e&', medial                              5.03  cm/s   ----------  LV E/e&', medial                            13.88        ----------  LV e&', average                             7.67  cm/s   ----------  LV E/e&', average                           9.11         ----------    Ventricular septum                         Value        Reference  IVS thickness, ED                          8.74  mm     ----------    LVOT                                       Value        Reference  LVOT ID,  S                                 21    mm     ----------  LVOT area                                  3.46  cm^2   ----------  LVOT peak velocity, S                      84.4  cm/s   ----------  LVOT mean velocity, S                      60.1  cm/s   ----------  LVOT VTI, S  17.2  cm     ----------    Aorta                                      Value        Reference  Aortic root ID, ED                         40    mm     ----------    Left atrium                                Value        Reference  LA ID, A-P, ES                             35    mm     ----------  LA ID/bsa, A-P                             1.41  cm/m^2 <=2.2  LA volume, S                               61.9  ml     ----------  LA volume/bsa, S                           24.9  ml/m^2 ----------  LA volume, ES, 1-p A4C                     55.3  ml     ----------  LA volume/bsa, ES, 1-p A4C                 22.3  ml/m^2 ----------  LA volume, ES, 1-p A2C                     62.9  ml     ----------  LA volume/bsa, ES, 1-p A2C                 25.3  ml/m^2 ----------    Mitral valve                               Value        Reference  Mitral E-wave peak velocity                69.8  cm/s   ----------  Mitral A-wave peak velocity                83.9  cm/s   ----------  Mitral deceleration time           (H)     298   ms     150 - 230  Mitral E/A ratio, peak                     0.8          ----------  Mitral maximal regurg velocity,            244   cm/s   ----------  PISA    Tricuspid valve                            Value        Reference  Tricuspid regurg peak velocity             100   cm/s   ----------  Tricuspid peak RV-RA gradient              4     mm Hg  ----------    Right atrium                               Value        Reference  RA ID, S-I, ES, A4C                        48.1  mm     34 - 49  RA area, ES, A4C                           14    cm^2   8.3 - 19.5  RA volume, ES, A/L                          33.3  ml     ----------  RA volume/bsa, ES, A/L                     13.4  ml/m^2 ----------    Right ventricle                            Value        Reference  TAPSE                                      17.4  mm     ----------  RV s&', lateral, S                          10.3  cm/s   ----------  Legend: (L)  and  (H)  mark values outside specified reference range.  ------------------------------------------------------------------- Prepared and Electronically Authenticated by  Rozann Lesches, M.D. 2019-10-31T15:25:23 Note: Reviewed        Physical Exam  General appearance: Well nourished, well developed, and well hydrated. In no apparent acute distress Mental status: Alert, oriented x 3 (person, place, & time)       Respiratory: No evidence of acute respiratory distress Eyes: PERLA Vitals: BP 108/69 (BP Location: Right Arm, Patient Position: Sitting, Cuff Size: Normal)   Pulse 67   Temp (!) 96.5 F (35.8 C) (Temporal)   Resp 18   Ht '5\' 10"'  (1.778 m)   Wt 251 lb (113.9 kg)   SpO2 99%   BMI 36.01 kg/m  BMI: Estimated body mass index is 36.01 kg/m as calculated from the following:   Height as of this encounter: '5\' 10"'  (1.778 m).   Weight as of this encounter: 251 lb (113.9 kg). Ideal: Ideal body weight: 73 kg (160 lb 15 oz) Adjusted ideal body weight: 89.3 kg (196 lb 15.4 oz)  Assessment   Status Diagnosis  Controlled Controlled Controlled 1. Chronic pain syndrome   2. Chronic knee pain (1ry area of Pain) (Bilateral) (L>R)   3. Chronic low back pain (2ry area of Pain) (Bilateral) (L>R)   4. Chronic lower extremity pain (3ry area of Pain) (Bilateral) (R>L)   5. Pharmacologic therapy   6. Uncomplicated opioid dependence ([place])      Updated Problems: Problem  Chronic low back pain (2ry area of Pain) (Bilateral) (L>R)  Chronic knee pain (1ry area of Pain) (Bilateral) (L>R)  Chronic lower extremity pain (3ry area of Pain) (Bilateral) (R>L)   Uncomplicated Opioid Dependence ([facility])    Plan of Care  Problem-specific:  No problem-specific Assessment & Plan notes found for this encounter.  Manuel Knight has a current medication list which includes the following long-term medication(s): amphetamine-dextroamphetamine, azelastine, citalopram, insulin aspart, ipratropium-albuterol, magnesium oxide, metformin, nitroglycerin, oxycodone, [START ON 10/21/2019] oxycodone, and [START ON 11/20/2019] oxycodone.  Pharmacotherapy (Medications Ordered): Meds ordered this encounter  Medications  . oxyCODONE (ROXICODONE) 15 MG immediate release tablet    Sig: Take 1 tablet (15 mg total) by mouth every 6 (six) hours as needed for pain. Must last 30 days    Dispense:  120 tablet    Refill:  0    Chronic Pain: STOP Act (Not applicable) Fill 1 day early if closed on refill date. Avoid benzodiazepines within 8 hours of opioids  . oxyCODONE (ROXICODONE) 15 MG immediate release tablet    Sig: Take 1 tablet (15 mg total) by mouth every 6 (six) hours as needed for pain. Must last 30 days    Dispense:  120 tablet    Refill:  0    Chronic Pain: STOP Act (Not applicable) Fill 1 day early if closed on refill date. Avoid benzodiazepines within 8 hours of opioids  . oxyCODONE (ROXICODONE) 15 MG immediate release tablet    Sig: Take 1 tablet (15 mg total) by mouth every 6 (six) hours as needed for pain. Must last 30 days    Dispense:  120 tablet    Refill:  0    Chronic Pain: STOP Act (Not applicable) Fill 1 day early if closed on refill date. Avoid benzodiazepines within 8 hours of opioids   Orders:  No orders of the defined types were placed in this encounter.  Follow-up plan:   Return in about 3 months (around 12/20/2019) for (20-min), (F2F), (Med Mgmt).      Interventional treatment options: Planned, scheduled, and/or pending:  Diagnostic Right L5-S1 LESI under fluoro and IV sedation.  (PRN)   Under consideration:  NOTE: History of diabetic  ketoacidosis Possible bilateral genicular nerve RFA Diagnostic left L4-5 LESI   Therapeutic/palliative (PRN):  Palliativebilateral IA knee injection Palliativebilateral genicular NB Palliativebilateral lumbar facetblock Palliative rightlumbar facet RFA(Last done 06/15/2016)  Palliative leftlumbar facet RFA(Last done 03/30/2016)  Palliativeleft-sided L4-5 LESI Palliativebilateral SI jointblock  Palliative right SI RFA (Last done 06/15/2016)  Palliative left SI RFA (Last done 03/30/2016)       Recent Visits Date Type Provider Dept  09/20/19 Office Visit Milinda Pointer, MD [facility]  Showing recent visits within past 90 days and meeting all other requirements Future Appointments Date Type Provider Dept  12/11/19 Appointment Milinda Pointer, MD [facility]  Showing future appointments within next 90 days and meeting all other requirements  I discussed the assessment and treatment plan with the patient. The patient was provided an opportunity to ask questions and all were answered. The patient agreed with the plan  and demonstrated an understanding of the instructions.  Patient advised to call back or seek an in-person evaluation if the symptoms or condition worsens.  Duration of encounter: 30 minutes.  Note by: Gaspar Cola, MD Date: 09/20/2019; Time: 5:04 PM

## 2019-09-20 NOTE — Progress Notes (Signed)
Nursing Pain Medication Assessment:  Safety precautions to be maintained throughout the outpatient stay will include: orient to surroundings, keep bed in low position, maintain call bell within reach at all times, provide assistance with transfer out of bed and ambulation.  Medication Inspection Compliance: Pill count conducted under aseptic conditions, in front of the patient. Neither the pills nor the bottle was removed from the patient's sight at any time. Once count was completed pills were immediately returned to the patient in their original bottle.  Medication: Oxycodone IR Pill/Patch Count: 6 of 120 pills remain Pill/Patch Appearance: Markings consistent with prescribed medication Bottle Appearance: Standard pharmacy container. Clearly labeled. Filled Date: 8 / 4 / 21 Last Medication intake:  TodaySafety precautions to be maintained throughout the outpatient stay will include: orient to surroundings, keep bed in low position, maintain call bell within reach at all times, provide assistance with transfer out of bed and ambulation.

## 2019-09-24 DIAGNOSIS — F1120 Opioid dependence, uncomplicated: Secondary | ICD-10-CM | POA: Insufficient documentation

## 2019-10-19 ENCOUNTER — Encounter: Payer: Self-pay | Admitting: Internal Medicine

## 2019-10-19 ENCOUNTER — Other Ambulatory Visit: Payer: Self-pay

## 2019-10-19 ENCOUNTER — Ambulatory Visit (INDEPENDENT_AMBULATORY_CARE_PROVIDER_SITE_OTHER): Payer: Medicare Other | Admitting: Internal Medicine

## 2019-10-19 DIAGNOSIS — I255 Ischemic cardiomyopathy: Secondary | ICD-10-CM | POA: Diagnosis not present

## 2019-10-19 DIAGNOSIS — R0600 Dyspnea, unspecified: Secondary | ICD-10-CM

## 2019-10-19 DIAGNOSIS — R0609 Other forms of dyspnea: Secondary | ICD-10-CM | POA: Insufficient documentation
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
## 2019-10-19 NOTE — Patient Instructions (Signed)
We can have you see Dr Elsworth Soho if you wish to work with a pulmonary doctor in this clinic in the future as he did your previous evaluation in 2016

## 2019-12-10 NOTE — Progress Notes (Deleted)
No show

## 2019-12-11 ENCOUNTER — Encounter: Payer: Medicare Other | Admitting: Pain Medicine

## 2019-12-11 ENCOUNTER — Other Ambulatory Visit: Payer: Self-pay | Admitting: Pain Medicine

## 2019-12-11 DIAGNOSIS — M7918 Myalgia, other site: Secondary | ICD-10-CM

## 2019-12-11 DIAGNOSIS — F1190 Opioid use, unspecified, uncomplicated: Secondary | ICD-10-CM

## 2019-12-11 DIAGNOSIS — G8929 Other chronic pain: Secondary | ICD-10-CM

## 2019-12-11 MED ORDER — MAGNESIUM OXIDE -MG SUPPLEMENT 500 MG PO TABS: 1.0000 | ORAL_TABLET | Freq: Every day | ORAL | 2 refills | Status: AC

## 2019-12-11 MED ORDER — NALOXONE HCL 2 MG/2ML IJ SOSY: 1.0000 mg | PREFILLED_SYRINGE | INTRAMUSCULAR | 1 refills | Status: AC | PRN

## 2020-09-19 NOTE — Progress Notes (Signed)
Cardiology Office Note  Date: 09/20/2020   ID: [name], DOB 1961/01/09, MRN [id number]  PCP:  [facility]  Cardiologist:  Kate Sable, MD (Inactive) Electrophysiologist:  None   Chief Complaint: 1 year follow-up  History of Present Illness: Lamoine Alcindor is a 60 y.o. male with a history of CAD, COPD, DM 2, HLD, HTN.  History of previous NSTEMI and subsequent three-vessel bypass graft 02/06/2013 with LIMA-LAD, SVG-OM1, SVG-RCA.  LV gram revealed EF of 30% and LVEDP was 20 mmHg.   He was last seen by Dr. Bronson Ing via telemedicine visit on 05/12/2019. History of chronic back and left knee pain seeing pain management every 3 months.  He denied any chest pain, episodic shortness of breath was stable.   He denied orthopnea, palpitations, leg swelling.  He was continuing aspirin, atorvastatin, Entresto 97/103 mg p.o. twice daily and carvedilol.  Previous EF had been 35% but subsequent echocardiogram revealed improvement to EF of 55% with wall motion abnormalities noted on October November 11, 2017 echocardiogram.  He was continuing Entresto was 97/103 mg p.o. twice daily and carvedilol.  Recent labs from PCP were requested.  No changes to antihypertensive therapy.  He was continuing high-dose Lipitor 80 mg daily.  Plan was to repeat lipids and triglycerides still elevated would start Vascepa.  He is here for 1 year follow-up.  He was last seen by Dr. Bronson Ing in April of last year.  He denies any recent acute illnesses or hospitalizations.  Denies any anginal or exertional symptoms, orthostatic symptoms, CVA or TIA-like symptoms, palpitations or arrhythmias, PND, orthopnea, bleeding issues.  Denies any claudication-like symptoms, DVT or PE-like symptoms, or lower extremity edema.  At last visit he was to have follow-up lipid panel to determine if Vascepa needed to be started.  His EKG today shows sinus rhythm with occasional premature ventricular complexes,  incomplete left bundle branch block, nonspecific T wave abnormality, prolonged QT with QT/QTc 388/466 ms.  Heart rate of 87.  Blood pressure is elevated on arrival today.  However patient states he did not take any of his a.m. medications.  He states blood pressures at home normally are around 130/80.  He states he has been eating better and has lost approximately 60 pounds.  States he is feeling much better since losing weight.   Past Medical History:  Diagnosis Date   Anxiety    Arthritis    RA   Cellulitis 2011   of neck    Chronic back pain    Claustrophobia    COPD (chronic obstructive pulmonary disease) ([facility])    Diabetes mellitus, type 2 ([place])    Diverticulitis of colon [id number]   complicated by abscess   GERD (gastroesophageal reflux disease)    Heart attack ([place]) 2017   History of kidney stones    Hyperlipidemia    Hypertension    Obesity    Pulmonary nodule     Past Surgical History:  Procedure Laterality Date   APPENDECTOMY  2007   gangrenous appendicitis    CORONARY ARTERY BYPASS GRAFT N/A 02/06/2013   Procedure: CORONARY ARTERY BYPASS GRAFTING (CABG) with TEE;  Surgeon: [name], MD;  Location: [facility];  Service: Open Heart Surgery;  Laterality: N/A;  CABG x three,  using left internal mammary artery and right leg greater saphenous vein harvested endoscopically   LEFT HEART CATHETERIZATION WITH CORONARY ANGIOGRAM N/A 02/03/2013   Procedure: LEFT HEART CATHETERIZATION WITH CORONARY ANGIOGRAM;  Surgeon: Jettie Booze,  MD;  Location: [facility];  Service: Cardiovascular;  Laterality: N/A;   TONSILLECTOMY      Current Outpatient Medications  Medication Sig Dispense Refill   alprazolam (XANAX) 2 MG tablet Take 2 mg by mouth 3 (three) times daily.      amLODipine (NORVASC) 10 MG tablet Take 10 mg by mouth daily.     amphetamine-dextroamphetamine (ADDERALL) 30 MG tablet Take 30 mg by mouth 3 (three) times daily.      aspirin EC 81 MG tablet Take 1 tablet (81 mg  total) by mouth daily.     atorvastatin (LIPITOR) 80 MG tablet Take 80 mg by mouth daily.      azelastine (ASTELIN) 0.1 % nasal spray Place 1 spray into both nostrils 2 (two) times daily.     carvedilol (COREG) 12.5 MG tablet Take 12.5 mg by mouth 2 (two) times daily.     citalopram (CELEXA) 20 MG tablet Take 20 mg by mouth daily.      fenofibrate 160 MG tablet Take 1 tablet by mouth daily.  0   fluticasone (FLONASE) 50 MCG/ACT nasal spray Place 1-2 sprays into both nostrils 2 (two) times daily as needed (for allergies.).   0   gabapentin (NEURONTIN) 300 MG capsule TAKE ONE CAPSULE BY MOUTH THREE TIMES DAILY. (TAKE WITH '600MG'$  TABLET TO TOTAL '900MG'$ )  1   gabapentin (NEURONTIN) 600 MG tablet Take 600 mg by mouth 3 (three) times daily.     insulin aspart (NOVOLOG) 100 UNIT/ML FlexPen Inject 20 Units into the skin 3 (three) times daily. 20 units three times a day with sliding scale max of 26 units      Ipratropium-Albuterol (COMBIVENT) 20-100 MCG/ACT AERS respimat Inhale 1 puff into the lungs every 6 (six) hours.     LEVEMIR FLEXTOUCH 100 UNIT/ML Pen Inject 50 Units into the skin at bedtime.  1   Magnesium Oxide 500 MG TABS Take 1 tablet (500 mg total) by mouth at bedtime. 30 tablet 2   Melatonin 10 MG TABS Take 10 mg by mouth at bedtime.     meloxicam (MOBIC) 15 MG tablet Take 15 mg by mouth 2 (two) times daily.     metFORMIN (GLUCOPHAGE) 1000 MG tablet Take 1,000 mg by mouth 2 (two) times daily.      naloxone (NARCAN) 2 MG/2ML injection Inject 1 mL (1 mg total) into the muscle as needed for up to 2 doses (for emergency use only). Always have available. Inject into thigh muscle and call 911 2 mL 1   nitroGLYCERIN (NITROSTAT) 0.4 MG SL tablet Place 1 tablet (0.4 mg total) under the tongue every 5 (five) minutes as needed for chest pain. 90 tablet 3   oxyCODONE (ROXICODONE) 15 MG immediate release tablet Take 1 tablet (15 mg total) by mouth every 6 (six) hours as needed for pain. Must last 30 days 120  tablet 0   pantoprazole (PROTONIX) 40 MG tablet Take 40 mg by mouth 2 (two) times daily.     sacubitril-valsartan (ENTRESTO) 97-103 MG Take 1 tablet by mouth 2 (two) times daily. 60 tablet 1   tamsulosin (FLOMAX) 0.4 MG CAPS capsule Take 0.4 mg by mouth 2 (two) times daily.     Vitamin D, Ergocalciferol, (DRISDOL) 1.25 MG (50000 UT) CAPS capsule Take 50,000 Units by mouth once a week.     No current facility-administered medications for this visit.   Allergies:  Lodine [etodolac], Tylenol [acetaminophen], Iodine, and Propoxyphene n-acetaminophen   Social History: The patient  reports that he quit smoking about 31 years ago. His smoking use included cigarettes. He started smoking about 53 years ago. He has a 28.50 pack-year smoking history. He has never used smokeless tobacco. He reports that he does not drink alcohol and does not use drugs.   Family History: The patient's family history includes Cancer in his mother; Coronary artery disease in his mother and sister; Diabetes in his sister; Heart attack in his brother and father.   ROS:  Please see the history of present illness. Otherwise, complete review of systems is positive for none.  All other systems are reviewed and negative.   Physical Exam: VS:  BP (!) 148/98 Comment: has not taken am meds  Pulse 91   Ht '5\' 10"'$  (1.778 m)   Wt 219 lb 3.2 oz (99.4 kg)   SpO2 95%   BMI 31.45 kg/m , BMI Body mass index is 31.45 kg/m.  Wt Readings from Last 3 Encounters:  09/20/20 219 lb 3.2 oz (99.4 kg)  10/19/19 260 lb (117.9 kg)  09/20/19 251 lb (113.9 kg)    General: Patient appears comfortable at rest. Neck: Supple, no elevated JVP or carotid bruits, no thyromegaly. Lungs: Clear to auscultation, nonlabored breathing at rest. Cardiac: Regular rate and rhythm, no S3 or significant systolic murmur, no pericardial rub. Extremities: No pitting edema, distal pulses 2+. Skin: Warm and dry. Musculoskeletal: No kyphosis. Neuropsychiatric: Alert  and oriented x3, affect grossly appropriate.  ECG: 09/20/2020 sinus rhythm with occasional premature ventricular complexes, incomplete LBBB, nonspecific T wave abnormality, prolonged QT with QT/QTc 388/466 ms.  Heart rate of 87.  Recent Labwork: No results found for requested labs within last 8760 hours.     Component Value Date/Time   CHOL 183 03/13/2015 0700   TRIG 191 (H) 03/13/2015 0700   HDL 24 (L) 03/13/2015 0700   CHOLHDL 7.6 03/13/2015 0700   VLDL 38 03/13/2015 0700   LDLCALC 121 (H) 03/13/2015 0700   LDLDIRECT 137 (H) 07/03/2009 1500    Other Studies Reviewed Today:  Echocardiogram 11/11/2017:   - Left ventricle: The cavity size was mildly dilated. Wall   thickness was normal. The estimated ejection fraction was 55%.   There is hypokinesis of the basalinferior myocardium.   Indeterminate diastolic function. - Aortic valve: Mildly calcified annulus. Trileaflet. - Mitral valve: Mildly calcified annulus. There was trivial   regurgitation. - Tricuspid valve: There was trivial regurgitation. - Pulmonary arteries: Systolic pressure could not be accurately   estimated. - Pericardium, extracardiac: A prominent pericardial fat pad was   present.  Assessment and Plan:  1. CAD in native artery   2. Ischemic cardiomyopathy   3. Mixed dyslipidemia   4. Essential hypertension    1. CAD in native artery Denies any current anginal or exertional symptoms.  Continue aspirin 81 mg daily.  Continue Coreg 12.5 mg p.o. twice daily.  Continue nitroglycerin sublingual as needed  2. Ischemic cardiomyopathy Previous cardiomyopathy by echo.  Most recent echo 2019 improved EF at 55%.  Mild hypokinesis of basal inferior myocardium, indeterminate diastolic function.  Trivial MR, trivial TR.  Continue Entresto 97/103 mg p.o. twice daily.  Continue Coreg 12.5 mg p.o. twice daily   3. Mixed dyslipidemia Continue fenofibrate 160 mg p.o. daily.  Continue atorvastatin 80 mg daily.  Please get a  follow-up lipid panel and LFTs.  May be able to start Vascepa per previous note pending lab results.  4. Essential hypertension Blood pressure elevated today at 148/98.  However patient  states he has not taken any of his antihypertensive medications today.  Continue amlodipine 10 mg daily.  Continue Coreg 12.5 mg p.o. twice daily.  Medication Adjustments/Labs and Tests Ordered: Current medicines are reviewed at length with the patient today.  Concerns regarding medicines are outlined above.   Disposition: Follow-up with Dr. Harl Bowie or APP 1 year  Signed, Levell July, NP 09/20/2020 9:20 AM    [facility], [place], [place] [address] Phone: [phone]; Fax: [phone]

## 2020-09-20 ENCOUNTER — Ambulatory Visit (INDEPENDENT_AMBULATORY_CARE_PROVIDER_SITE_OTHER): Payer: Medicare Other | Admitting: Family Medicine

## 2020-09-20 ENCOUNTER — Encounter: Payer: Self-pay | Admitting: Family Medicine

## 2020-09-20 ENCOUNTER — Other Ambulatory Visit: Payer: Self-pay

## 2020-09-20 VITALS — BP 148/98 | HR 91 | Ht 70.0 in | Wt 219.2 lb

## 2020-09-20 DIAGNOSIS — I2510 Atherosclerotic heart disease of native coronary artery without angina pectoris: Secondary | ICD-10-CM

## 2020-09-20 DIAGNOSIS — I10 Essential (primary) hypertension: Secondary | ICD-10-CM

## 2020-09-20 DIAGNOSIS — I255 Ischemic cardiomyopathy: Secondary | ICD-10-CM

## 2020-09-20 DIAGNOSIS — E782 Mixed hyperlipidemia: Secondary | ICD-10-CM

## 2020-09-20 NOTE — Patient Instructions (Signed)
Medication Instructions:  Your physician recommends that you continue on your current medications as directed. Please refer to the Current Medication list given to you today.  *If you need a refill on your cardiac medications before your next appointment, please call your pharmacy*   Lab Work: BMP MAG LIPID LFT If you have labs (blood work) drawn today and your tests are completely normal, you will receive your results only by: [facility] (if you have MyChart) OR A paper copy in the mail If you have any lab test that is abnormal or we need to change your treatment, we will call you to review the results.   Testing/Procedures: None   Follow-Up: At [facility], you and your health needs are our priority.  As part of our continuing mission to provide you with exceptional heart care, we have created designated Provider Care Teams.  These Care Teams include your primary Cardiologist (physician) and Advanced Practice Providers (APPs -  Physician Assistants and Nurse Practitioners) who all work together to provide you with the care you need, when you need it.  We recommend signing up for the patient portal called "MyChart".  Sign up information is provided on this After Visit Summary.  MyChart is used to connect with patients for Virtual Visits (Telemedicine).  Patients are able to view lab/test results, encounter notes, upcoming appointments, etc.  Non-urgent messages can be sent to your provider as well.   To learn more about what you can do with MyChart, go to [email or web address].    Your next appointment:   1 year(s)  The format for your next appointment:   In Person  Provider:   Katina Dung, NP   Other Instructions

## 2020-09-20 NOTE — Addendum Note (Signed)
Addended by: Laurine Blazer on: 09/20/2020 04:11 PM   Modules accepted: Orders

## 2020-09-23 ENCOUNTER — Other Ambulatory Visit (HOSPITAL_COMMUNITY)
Admission: RE | Admit: 2020-09-23 | Discharge: 2020-09-23 | Disposition: A | Discharge status: Home / Self Care | Payer: Medicare Other | Source: Ambulatory Visit | Attending: Family Medicine | Admitting: Family Medicine

## 2020-09-23 DIAGNOSIS — I255 Ischemic cardiomyopathy: Secondary | ICD-10-CM | POA: Insufficient documentation

## 2020-09-23 DIAGNOSIS — E782 Mixed hyperlipidemia: Secondary | ICD-10-CM | POA: Diagnosis not present

## 2020-09-23 LAB — BASIC METABOLIC PANEL
Anion gap: 12 (ref 5–15)
BUN: 25 mg/dL — ABNORMAL HIGH (ref 6–20)
CO2: 23 mmol/L (ref 22–32)
Calcium: 9.6 mg/dL (ref 8.9–10.3)
Chloride: 98 mmol/L (ref 98–111)
Creatinine, Ser: 0.88 mg/dL (ref 0.61–1.24)
GFR, Estimated: >60 mL/min (ref >60)
Glucose, Bld: 275 mg/dL — ABNORMAL HIGH (ref 70–99)
Potassium: 4.9 mmol/L (ref 3.5–5.1)
Sodium: 133 mmol/L — ABNORMAL LOW (ref 135–145)

## 2020-09-23 LAB — HEPATIC FUNCTION PANEL
ALT: 19 U/L (ref 0–44)
AST: 15 U/L (ref 15–41)
Albumin: 4 g/dL (ref 3.5–5.0)
Alkaline Phosphatase: 55 U/L (ref 38–126)
Bilirubin, Direct: <0.1 mg/dL (ref 0.0–0.2)
Total Bilirubin: 0.8 mg/dL (ref 0.3–1.2)
Total Protein: 7.4 g/dL (ref 6.5–8.1)

## 2020-09-23 LAB — MAGNESIUM: Magnesium: 1.8 mg/dL (ref 1.7–2.4)

## 2020-09-24 ENCOUNTER — Telehealth: Payer: Self-pay | Admitting: *Deleted

## 2020-09-24 NOTE — Telephone Encounter (Signed)
-----   Message from Verta Ellen., NP sent at 09/23/2020 10:05 PM EDT ----- BMET  looks good except for slightly low sodium of 133 and elevated glucose. Glucose 275. Needs better control of sugar. Renal function looks good. Liver function tests look good. I don not see lipids yet. Labs may be pending.  Verta Ellen, NP  09/23/2020 10:03 PM

## 2020-09-24 NOTE — Telephone Encounter (Signed)
Patient informed. Copy sent to PCP °

## 2020-09-24 NOTE — Telephone Encounter (Signed)
Patient informed and verbalized understanding of plan. Will pick copy up in the morning

## 2020-11-02 NOTE — Progress Notes (Signed)
[facility] [address] [place] [address]   CLINIC:  Medical Oncology/Hematology  Patient Care Team: [facility] as PCP - General Bronson Ing, Lenise Herald, MD (Inactive) as PCP - Cardiology (Cardiology) Danie Binder, MD (Inactive) as Consulting Physician (Gastroenterology) Evans Lance, MD as Consulting Physician (Cardiology) Derek Jack, MD as Medical Oncologist (Hematology)  CHIEF COMPLAINTS/PURPOSE OF CONSULTATION:  Evaluation of polycythemia   HISTORY OF PRESENTING ILLNESS:  Manuel Knight 60 y.o. 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  Today he reports feeling good. He reports intentionally losing 60 pounds in 5 months. He denies fevers, night sweating, itching after showers, and changing colors of fingertips. He reports back pain that radiates down to his toes. He had never donated blood and has never had a blood transfusion. He reports recent increased dizziness. He currently live at home in [place] with his family. 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 He quit smoking 30 years ago after smoking 1 ppd for 15 years. He denies family history of polycythemia. His mother passed form leukemia, his maternal uncle had an unspecified cancer, and his paternal uncle had an unspecified cancer.   MEDICAL HISTORY:  Past Medical History:  Diagnosis Date   Anxiety    Arthritis    RA   Cellulitis 2011   of neck    Chronic back pain    Claustrophobia    COPD (chronic obstructive pulmonary disease) (HCC)    Diabetes mellitus, type 2 ([facility])    Diverticulitis of colon 3267   complicated by abscess   GERD (gastroesophageal reflux disease)    Heart attack ([facility]) 2017   History of kidney stones    Hyperlipidemia    Hypertension    Obesity    Pulmonary nodule     SURGICAL HISTORY: Past Surgical History:  Procedure Laterality Date   APPENDECTOMY  2007   gangrenous  appendicitis    CORONARY ARTERY BYPASS GRAFT N/A 02/06/2013   Procedure: CORONARY ARTERY BYPASS GRAFTING (CABG) with TEE;  Surgeon: [name], MD;  Location: [facility];  Service: Open Heart Surgery;  Laterality: N/A;  CABG x three,  using left internal mammary artery and right leg greater saphenous vein harvested endoscopically   LEFT HEART CATHETERIZATION WITH CORONARY ANGIOGRAM N/A 02/03/2013   Procedure: LEFT HEART CATHETERIZATION WITH CORONARY ANGIOGRAM;  Surgeon: Jettie Booze, MD;  [facility] C[facility] LAB;  Service: Cardiovascular;  Laterality: N/A;   TONSILLECTOMY      SOCIAL HISTORY: Social History   Socioeconomic History   Marital status: Single    Spouse name: Not on file   Number of children: 2tw   Years of education: Not on file   Highest education level: Not on file  Occupational History   Occupation: Disabled  Tobacco Use   Smoking status: Former    Packs/day: 1.50    Years: 19.00    Pack years: 28.50    Types: Cigarettes    Start date: 03/12/1967    Quit date: 11/12/1988    Years since quitting: 32.0   Smokeless tobacco: Never  Vaping Use   Vaping Use: Never used  Substance and Sexual Activity   Alcohol use: No    Alcohol/week: 0.0 standard drinks    Comment:     Drug use: No   Sexual activity: Yes    Birth control/protection: None  Other Topics Concern   Not on file  Social History Narrative   Lives with significant other of 18 years. They are raising his grandson.   Social Determinants of Health   Financial Resource Strain: Not on file  Food Insecurity: Not on file  Transportation Needs: Not on file  Physical Activity: Not on file  Stress: Not on file  Social Connections: Not on file  Intimate Partner Violence: Not on file    FAMILY HISTORY: Family History  Problem Relation Age of Onset   Cancer Mother        "blood transplant"   Coronary artery disease Mother    Heart attack Father    Diabetes Sister    Coronary artery disease Sister     Heart attack Brother    Colon cancer Neg Hx     ALLERGIES:  is allergic to lodine [etodolac], tylenol [acetaminophen], contrast media [iodinated diagnostic agents], iodine, and propoxyphene n-acetaminophen.  MEDICATIONS:  Current Outpatient Medications  Medication Sig Dispense Refill   alprazolam (XANAX) 2 MG tablet Take 2 mg by mouth 3 (three) times daily.      amLODipine (NORVASC) 10 MG tablet Take 10 mg by mouth daily.     amphetamine-dextroamphetamine (ADDERALL) 30 MG tablet Take 30 mg by mouth 3 (three) times daily.      aspirin EC 81 MG tablet Take 1 tablet (81 mg total) by mouth daily.     atorvastatin (LIPITOR) 80 MG tablet Take 80 mg by mouth daily.      azelastine (ASTELIN) 0.1 % nasal spray Place 1 spray into both nostrils 2 (two) times daily.     carvedilol (COREG) 12.5 MG tablet Take 12.5 mg by mouth 2 (two) times daily.     citalopram (CELEXA) 20 MG tablet Take 20 mg by mouth daily.      fenofibrate 160 MG tablet Take 1 tablet by mouth daily.  0   gabapentin (NEURONTIN) 300 MG capsule TAKE ONE CAPSULE BY MOUTH THREE TIMES DAILY. (TAKE WITH 600MG  TABLET TO TOTAL 900MG )  1   gabapentin (NEURONTIN) 600 MG tablet Take 600 mg by mouth 3 (three) times daily.     insulin aspart (NOVOLOG) 100 UNIT/ML FlexPen Inject 20 Units into the skin 3 (three) times daily. 20 units three times a day with sliding scale max of 26 units      Ipratropium-Albuterol (COMBIVENT) 20-100 MCG/ACT AERS respimat Inhale 1 puff into the lungs every 6 (six) hours.     LEVEMIR FLEXTOUCH 100 UNIT/ML Pen Inject 50 Units into the skin at bedtime.  1   Magnesium Oxide 500 MG TABS Take 1 tablet (500 mg total) by mouth at bedtime. 30 tablet 2   Melatonin 10 MG TABS Take 10 mg by mouth at bedtime.     meloxicam (MOBIC) 15 MG tablet Take 15 mg by mouth 2 (two) times daily.     metFORMIN (GLUCOPHAGE) 1000 MG tablet Take 1,000 mg by mouth 2 (two) times daily.      naloxone (NARCAN) 2 MG/2ML injection Inject 1 mL (1 mg  total) into the muscle as needed for up to 2 doses (for emergency use only). Always have available. Inject into thigh muscle and call 911 2 mL 1   pantoprazole (PROTONIX) 40 MG tablet Take 40 mg by mouth 2 (two) times daily.     sacubitril-valsartan (ENTRESTO) 97-103 MG Take 1 tablet by mouth 2 (two) times daily. 60 tablet 1   tamsulosin (FLOMAX) 0.4 MG CAPS capsule Take 0.4 mg by mouth 2 (two) times daily.  Vitamin D, Ergocalciferol, (DRISDOL) 1.25 MG (50000 UT) CAPS capsule Take 50,000 Units by mouth once a week.     albuterol (PROVENTIL) (2.5 MG/3ML) 0.083% nebulizer solution SMARTSIG:1 Vial(s) Via Nebulizer Every 4-6 Hours PRN     cetirizine (ZYRTEC) 10 MG tablet SMARTSIG:1 Tablet(s) By Mouth Every Evening     Cholecalciferol 50 MCG (2000 UT) CAPS Take by mouth.     cyclobenzaprine (FLEXERIL) 10 MG tablet Take 10 mg by mouth 3 (three) times daily as needed.     fluticasone (FLONASE) 50 MCG/ACT nasal spray Place 1-2 sprays into both nostrils 2 (two) times daily as needed (for allergies.).  (Patient not taking: Reported on 11/04/2020)  0   JARDIANCE 10 MG TABS tablet Take 10 mg by mouth daily.     nitroGLYCERIN (NITROSTAT) 0.4 MG SL tablet Place 1 tablet (0.4 mg total) under the tongue every 5 (five) minutes as needed for chest pain. (Patient not taking: Reported on 11/04/2020) 90 tablet 3   oxyCODONE (ROXICODONE) 15 MG immediate release tablet Take 1 tablet (15 mg total) by mouth every 6 (six) hours as needed for pain. Must last 30 days (Patient not taking: Reported on 11/04/2020) 120 tablet 0   No current facility-administered medications for this visit.    REVIEW OF SYSTEMS:   Review of Systems  Constitutional:  Positive for unexpected weight change (-60 lbs). Negative for appetite change, fatigue (80%) and fever.  Respiratory:  Positive for cough and shortness of breath.   Musculoskeletal:  Positive for arthralgias (6/10 knee) and back pain (6/10).  Skin:  Negative for itching.   Neurological:  Positive for dizziness and numbness (feet and toes).  Psychiatric/Behavioral:  The patient is nervous/anxious.   All other systems reviewed and are negative.   PHYSICAL EXAMINATION: ECOG PERFORMANCE STATUS: 1 - Symptomatic but completely ambulatory  There were no vitals filed for this visit. There were no vitals filed for this visit. Physical Exam Vitals reviewed.  Constitutional:      Appearance: Normal appearance.  Cardiovascular:     Rate and Rhythm: Normal rate and regular rhythm.     Pulses: Normal pulses.     Heart sounds: Normal heart sounds.  Pulmonary:     Effort: Pulmonary effort is normal.     Breath sounds: Normal breath sounds.  Abdominal:     Palpations: Abdomen is soft. There is no hepatomegaly, splenomegaly or mass.     Tenderness: There is no abdominal tenderness.  Lymphadenopathy:     Cervical: No cervical adenopathy.     Right cervical: No superficial cervical adenopathy.    Left cervical: No superficial cervical adenopathy.     Upper Body:     Right upper body: No supraclavicular, axillary or pectoral adenopathy.     Left upper body: No supraclavicular, axillary or pectoral adenopathy.     Lower Body: No right inguinal adenopathy. No left inguinal adenopathy.  Neurological:     General: No focal deficit present.     Mental Status: He is alert and oriented to person, place, and time.  Psychiatric:        Mood and Affect: Mood normal.        Behavior: Behavior normal.     LABORATORY DATA:  I have reviewed the data as listed Recent Results (from the past 2160 hour(s))  Magnesium     Status: None   Collection Time: 09/23/20 11:47 AM  Result Value Ref Range   Magnesium 1.8 1.7 - 2.4 mg/dL  Comment: Performed at [facility], [address], [place], [place] [address]  Basic metabolic panel     Status: Abnormal   Collection Time: 09/23/20 11:47 AM  Result Value Ref Range   Sodium 133 (L) 135 - 145 mmol/L   Potassium 4.9 3.5 - 5.1  mmol/L   Chloride 98 98 - 111 mmol/L   CO2 23 22 - 32 mmol/L   Glucose, Bld 275 (H) 70 - 99 mg/dL    Comment: Glucose reference range applies only to samples taken after fasting for at least 8 hours.   BUN 25 (H) 6 - 20 mg/dL   Creatinine, Ser 0.88 0.61 - 1.24 mg/dL   Calcium 9.6 8.9 - 10.3 mg/dL   GFR, Estimated >60 >60 mL/min    Comment: (NOTE) Calculated using the CKD-EPI Creatinine Equation (2021)    Anion gap 12 5 - 15    Comment: Performed at [facility], [address], [place], [place] [address]  Hepatic function panel     Status: None   Collection Time: 09/23/20 11:47 AM  Result Value Ref Range   Total Protein 7.4 6.5 - 8.1 g/dL   Albumin 4.0 3.5 - 5.0 g/dL   AST 15 15 - 41 U/L   ALT 19 0 - 44 U/L   Alkaline Phosphatase 55 38 - 126 U/L   Total Bilirubin 0.8 0.3 - 1.2 mg/dL   Bilirubin, Direct <0.1 0.0 - 0.2 mg/dL   Indirect Bilirubin NOT CALCULATED 0.3 - 0.9 mg/dL    Comment: Performed at [facility], [address], [place], [place] [address]  CBC with Differential     Status: Abnormal   Collection Time: 11/04/20  9:08 AM  Result Value Ref Range   WBC 8.1 4.0 - 10.5 K/uL   RBC 5.57 4.22 - 5.81 MIL/uL   Hemoglobin 17.4 (H) 13.0 - 17.0 g/dL   HCT 51.4 39.0 - 52.0 %   MCV 92.3 80.0 - 100.0 fL   MCH 31.2 26.0 - 34.0 pg   MCHC 33.9 30.0 - 36.0 g/dL   RDW 12.5 11.5 - 15.5 %   Platelets 218 150 - 400 K/uL   nRBC 0.0 0.0 - 0.2 %   Neutrophils Relative % 57 %   Neutro Abs 4.6 1.7 - 7.7 K/uL   Lymphocytes Relative 27 %   Lymphs Abs 2.2 0.7 - 4.0 K/uL   Monocytes Relative 12 %   Monocytes Absolute 1.0 0.1 - 1.0 K/uL   Eosinophils Relative 2 %   Eosinophils Absolute 0.2 0.0 - 0.5 K/uL   Basophils Relative 2 %   Basophils Absolute 0.1 0.0 - 0.1 K/uL   Immature Granulocytes 0 %   Abs Immature Granulocytes 0.03 0.00 - 0.07 K/uL    Comment: Performed at [facility], [address], [place],  [address]    RADIOGRAPHIC STUDIES: I have personally reviewed the  radiological images as listed and agreed with the findings in the report. No results found.  ASSESSMENT:  Polycythemia: - Patient seen at the request of Dr. Bartolo Darter for elevated hemoglobin and hematocrit. - Labs on 10/24/2020 shows hemoglobin 17.8 (13.2-17.1) and hematocrit 53.1 (38.5/50).  White count and platelet count was normal. - He denies any vasomotor symptoms or aquagenic pruritus.  No prior history of thrombosis.  No testosterone supplements.  No fluid pills. - Denies any fevers, night sweats.  He lost about 60 pounds in the last 5 months but has changed his eating habits dramatically by quitting greasy foods and  stopped drinking soda.  Social/family history: - Lives at home with his wife.  Worked on Warehouse manager. - 1 pack/day for 15 years, quit smoking 30 years ago. - No family history of polycythemia.  Mother died of leukemia.  Maternal uncle and paternal uncle had cancers, type unknown to the patient.   PLAN:  Polycythemia: - We have discussed the etiology of polycythemia. - Will check CBC today.  If the hemoglobin is significantly high, will consider checking serum erythropoietin and JAK2 V6 1F with reflex testing. - Otherwise we will monitor his CBC periodically. - He is agreeable to plan.   All questions were answered. The patient knows to call the clinic with any problems, questions or concerns.  Derek Jack, MD 11/04/20 5:47 PM  [facility] [phone]   I, Thana Ates, am acting as a scribe for Dr. Derek Jack.  I, Derek Jack MD, have reviewed the above documentation for accuracy and completeness, and I agree with the above.

## 2020-11-04 ENCOUNTER — Other Ambulatory Visit: Payer: Self-pay

## 2020-11-04 ENCOUNTER — Inpatient Hospital Stay (HOSPITAL_COMMUNITY): Payer: Medicare Other | Attending: Hematology | Admitting: Hematology

## 2020-11-04 ENCOUNTER — Inpatient Hospital Stay (HOSPITAL_COMMUNITY): Payer: Medicare Other

## 2020-11-04 ENCOUNTER — Encounter (HOSPITAL_COMMUNITY): Payer: Self-pay | Admitting: Hematology

## 2020-11-04 DIAGNOSIS — I252 Old myocardial infarction: Secondary | ICD-10-CM | POA: Diagnosis not present

## 2020-11-04 DIAGNOSIS — R0602 Shortness of breath: Secondary | ICD-10-CM | POA: Diagnosis not present

## 2020-11-04 DIAGNOSIS — Z87442 Personal history of urinary calculi: Secondary | ICD-10-CM | POA: Insufficient documentation

## 2020-11-04 DIAGNOSIS — D45 Polycythemia vera: Secondary | ICD-10-CM | POA: Diagnosis present

## 2020-11-04 DIAGNOSIS — Z886 Allergy status to analgesic agent status: Secondary | ICD-10-CM | POA: Diagnosis not present

## 2020-11-04 DIAGNOSIS — M2550 Pain in unspecified joint: Secondary | ICD-10-CM | POA: Insufficient documentation

## 2020-11-04 DIAGNOSIS — Z888 Allergy status to other drugs, medicaments and biological substances status: Secondary | ICD-10-CM | POA: Diagnosis not present

## 2020-11-04 DIAGNOSIS — Z832 Family history of diseases of the blood and blood-forming organs and certain disorders involving the immune mechanism: Secondary | ICD-10-CM | POA: Insufficient documentation

## 2020-11-04 DIAGNOSIS — Z79899 Other long term (current) drug therapy: Secondary | ICD-10-CM | POA: Diagnosis not present

## 2020-11-04 DIAGNOSIS — Z833 Family history of diabetes mellitus: Secondary | ICD-10-CM | POA: Diagnosis not present

## 2020-11-04 DIAGNOSIS — M549 Dorsalgia, unspecified: Secondary | ICD-10-CM

## 2020-11-04 DIAGNOSIS — Z87891 Personal history of nicotine dependence: Secondary | ICD-10-CM | POA: Insufficient documentation

## 2020-11-04 DIAGNOSIS — Z8249 Family history of ischemic heart disease and other diseases of the circulatory system: Secondary | ICD-10-CM

## 2020-11-04 DIAGNOSIS — I10 Essential (primary) hypertension: Secondary | ICD-10-CM | POA: Diagnosis not present

## 2020-11-04 DIAGNOSIS — R42 Dizziness and giddiness: Secondary | ICD-10-CM | POA: Diagnosis not present

## 2020-11-04 DIAGNOSIS — Z9049 Acquired absence of other specified parts of digestive tract: Secondary | ICD-10-CM | POA: Diagnosis not present

## 2020-11-04 DIAGNOSIS — R059 Cough, unspecified: Secondary | ICD-10-CM | POA: Diagnosis not present

## 2020-11-04 DIAGNOSIS — D751 Secondary polycythemia: Secondary | ICD-10-CM | POA: Insufficient documentation

## 2020-11-04 LAB — CBC WITH DIFFERENTIAL/PLATELET
Abs Immature Granulocytes: 0.03 10*3/uL (ref 0.00–0.07)
Basophils Absolute: 0.1 10*3/uL (ref 0.0–0.1)
Basophils Relative: 2 %
Eosinophils Absolute: 0.2 10*3/uL (ref 0.0–0.5)
Eosinophils Relative: 2 %
HCT: 51.4 % (ref 39.0–52.0)
Hemoglobin: 17.4 g/dL — ABNORMAL HIGH (ref 13.0–17.0)
Immature Granulocytes: 0 %
Lymphocytes Relative: 27 %
Lymphs Abs: 2.2 10*3/uL (ref 0.7–4.0)
MCH: 31.2 pg (ref 26.0–34.0)
MCHC: 33.9 g/dL (ref 30.0–36.0)
MCV: 92.3 fL (ref 80.0–100.0)
Monocytes Absolute: 1 10*3/uL (ref 0.1–1.0)
Monocytes Relative: 12 %
Neutro Abs: 4.6 10*3/uL (ref 1.7–7.7)
Neutrophils Relative %: 57 %
Platelets: 218 10*3/uL (ref 150–400)
RBC: 5.57 MIL/uL (ref 4.22–5.81)
RDW: 12.5 % (ref 11.5–15.5)
WBC: 8.1 10*3/uL (ref 4.0–10.5)
nRBC: 0 % (ref 0.0–0.2)

## 2020-11-04 NOTE — Patient Instructions (Addendum)
[facility] at [facility] Discharge Instructions  You were seen and examined today by Dr. Delton Coombes. Dr. Delton Coombes is a hematologist, meaning that he specializes in blood disorders. Dr. Delton Coombes discussed your past medical history, family history of cancers/blood disorders, and the events that led to you being here today.  You were referred to Dr. Delton Coombes by the [facility]. Your recent blood work noted an elevation in your red blood cells. Dr. Delton Coombes recommended additional blood work today to recheck your red blood cells. If it is normal, there will be no additional lab work needed. If it is abnormal, Dr. Delton Coombes will ask for additional lab work.  Follow-up as scheduled.   Thank you for choosing [facility] to provide your oncology and hematology care.  To afford each patient quality time with our provider, please arrive at least 15 minutes before your scheduled appointment time.   If you have a lab appointment with the [facility] please come in thru the Main Entrance and check in at the main information desk.  You need to re-schedule your appointment should you arrive 10 or more minutes late.  We strive to give you quality time with our providers, and arriving late affects you and other patients whose appointments are after yours.  Also, if you no show three or more times for appointments you may be dismissed from the clinic at the providers discretion.     Again, thank you for choosing [facility].  Our hope is that these requests will decrease the amount of time that you wait before being seen by our physicians.       _____________________________________________________________  Should you have questions after your visit to [facility], please contact our office at [phone] and follow the prompts.  Our office hours are 8:00 a.m. and 4:30 p.m. Monday - Friday.  Please  note that voicemails left after 4:00 p.m. may not be returned until the following business day.  We are closed weekends and major holidays.  You do have access to a nurse 24-7, just call the main number to the clinic [phone] and do not press any options, hold on the line and a nurse will answer the phone.    For prescription refill requests, have your pharmacy contact our office and allow 72 hours.    Due to Covid, you will need to wear a mask upon entering the hospital. If you do not have a mask, a mask will be given to you at the Main Entrance upon arrival. For doctor visits, patients may have 1 support person age 39 or older with them. For treatment visits, patients can not have anyone with them due to social distancing guidelines and our immunocompromised population.

## 2020-11-05 ENCOUNTER — Telehealth (HOSPITAL_COMMUNITY): Payer: Self-pay | Admitting: *Deleted

## 2020-11-05 NOTE — Telephone Encounter (Signed)
Patient called and labs were reviewed.  Per Dr Delton Coombes, no further testing is needed at this time.  Follow up as planned.  Patient verbalized understanding.

## 2020-11-19 NOTE — Progress Notes (Deleted)
NO SHOW

## 2020-11-20 ENCOUNTER — Ambulatory Visit (HOSPITAL_COMMUNITY): Payer: Medicare Other | Admitting: Physician Assistant

## 2020-11-20 ENCOUNTER — Other Ambulatory Visit (HOSPITAL_COMMUNITY): Payer: Self-pay | Admitting: Physician Assistant

## 2020-11-20 DIAGNOSIS — D751 Secondary polycythemia: Secondary | ICD-10-CM

## 2020-12-12 DEATH — deceased

## 2021-02-20 ENCOUNTER — Inpatient Hospital Stay (HOSPITAL_COMMUNITY): Payer: Medicaid Other | Attending: Hematology

## 2021-03-06 ENCOUNTER — Ambulatory Visit (HOSPITAL_COMMUNITY): Payer: Medicare Other | Admitting: Physician Assistant
